# Patient Record
Sex: Male | Born: 1970 | Race: Black or African American | Hispanic: No | Marital: Married | Smoking: Never smoker
Health system: Southern US, Community
[De-identification: ages and names within clinical notes are randomized; demographics above are authoritative.]

## PROBLEM LIST (undated history)

## (undated) DIAGNOSIS — J45909 Unspecified asthma, uncomplicated: Secondary | ICD-10-CM

## (undated) DIAGNOSIS — I509 Heart failure, unspecified: Secondary | ICD-10-CM

## (undated) DIAGNOSIS — I10 Essential (primary) hypertension: Secondary | ICD-10-CM

## (undated) DIAGNOSIS — N289 Disorder of kidney and ureter, unspecified: Secondary | ICD-10-CM

## (undated) DIAGNOSIS — R7989 Other specified abnormal findings of blood chemistry: Secondary | ICD-10-CM

## (undated) DIAGNOSIS — G629 Polyneuropathy, unspecified: Secondary | ICD-10-CM

## (undated) DIAGNOSIS — G4733 Obstructive sleep apnea (adult) (pediatric): Secondary | ICD-10-CM

## (undated) DIAGNOSIS — R945 Abnormal results of liver function studies: Secondary | ICD-10-CM

## (undated) DIAGNOSIS — E669 Obesity, unspecified: Secondary | ICD-10-CM

## (undated) DIAGNOSIS — E119 Type 2 diabetes mellitus without complications: Secondary | ICD-10-CM

## (undated) DIAGNOSIS — K3184 Gastroparesis: Secondary | ICD-10-CM

## (undated) DIAGNOSIS — K219 Gastro-esophageal reflux disease without esophagitis: Secondary | ICD-10-CM

## (undated) DIAGNOSIS — D573 Sickle-cell trait: Secondary | ICD-10-CM

---

## 1997-06-08 HISTORY — PX: HIP FRACTURE SURGERY: SHX118

## 2007-05-29 ENCOUNTER — Emergency Department (HOSPITAL_COMMUNITY): Admission: EM | Admit: 2007-05-29 | Discharge: 2007-05-29 | Payer: Self-pay | Admitting: Emergency Medicine

## 2008-08-23 ENCOUNTER — Emergency Department (HOSPITAL_COMMUNITY): Admission: EM | Admit: 2008-08-23 | Discharge: 2008-08-23 | Payer: Self-pay | Admitting: Emergency Medicine

## 2008-10-25 ENCOUNTER — Encounter: Admission: RE | Admit: 2008-10-25 | Discharge: 2008-10-25 | Payer: Self-pay | Admitting: Family Medicine

## 2009-05-23 ENCOUNTER — Emergency Department (HOSPITAL_COMMUNITY): Admission: EM | Admit: 2009-05-23 | Discharge: 2009-05-23 | Payer: Self-pay | Admitting: Emergency Medicine

## 2009-07-12 ENCOUNTER — Emergency Department (HOSPITAL_COMMUNITY): Admission: EM | Admit: 2009-07-12 | Discharge: 2009-07-12 | Payer: Self-pay | Admitting: Emergency Medicine

## 2010-06-29 ENCOUNTER — Observation Stay (HOSPITAL_COMMUNITY)
Admission: EM | Admit: 2010-06-29 | Discharge: 2010-07-02 | Payer: Self-pay | Source: Home / Self Care | Attending: Cardiology | Admitting: Cardiology

## 2010-07-01 LAB — POCT CARDIAC MARKERS
CKMB, poc: <1 ng/mL — ABNORMAL LOW (ref 1.0–8.0)
CKMB, poc: <1 ng/mL — ABNORMAL LOW (ref 1.0–8.0)
Troponin i, poc: <0.05 ng/mL (ref 0.00–0.09)

## 2010-07-01 LAB — COMPREHENSIVE METABOLIC PANEL
ALT: 19 U/L (ref 0–53)
AST: 22 U/L (ref 0–37)
Albumin: 3.5 g/dL (ref 3.5–5.2)
Alkaline Phosphatase: 92 U/L (ref 39–117)
BUN: 11 mg/dL (ref 6–23)
Calcium: 9.5 mg/dL (ref 8.4–10.5)
Creatinine, Ser: 1.13 mg/dL (ref 0.4–1.5)
Glucose, Bld: 346 mg/dL — ABNORMAL HIGH (ref 70–99)
Potassium: 3.5 mEq/L (ref 3.5–5.1)
Total Protein: 7.2 g/dL (ref 6.0–8.3)

## 2010-07-01 LAB — BASIC METABOLIC PANEL
BUN: 8 mg/dL (ref 6–23)
CO2: 24 mEq/L (ref 19–32)
Calcium: 9.1 mg/dL (ref 8.4–10.5)
Chloride: 102 mEq/L (ref 96–112)
Creatinine, Ser: 1.01 mg/dL (ref 0.4–1.5)
GFR calc Af Amer: >60 mL/min (ref >60)
Glucose, Bld: 465 mg/dL — ABNORMAL HIGH (ref 70–99)
Sodium: 136 mEq/L (ref 135–145)

## 2010-07-01 LAB — CBC
MCH: 27.5 pg (ref 26.0–34.0)
Platelets: 227 10*3/uL (ref 150–400)
RDW: 13 % (ref 11.5–15.5)
WBC: 7.6 10*3/uL (ref 4.0–10.5)

## 2010-07-01 LAB — GLUCOSE, CAPILLARY
Glucose-Capillary: 450 mg/dL — ABNORMAL HIGH (ref 70–99)
Glucose-Capillary: 290 mg/dL — ABNORMAL HIGH (ref 70–99)
Glucose-Capillary: 363 mg/dL — ABNORMAL HIGH (ref 70–99)

## 2010-07-01 LAB — DIFFERENTIAL
Basophils Absolute: 0 10*3/uL (ref 0.0–0.1)
Lymphocytes Relative: 24 % (ref 12–46)
Lymphs Abs: 1.8 10*3/uL (ref 0.7–4.0)
Monocytes Relative: 8 % (ref 3–12)

## 2010-07-01 LAB — PROTIME-INR
INR: 0.9 (ref 0.00–1.49)
Prothrombin Time: 12.4 seconds (ref 11.6–15.2)

## 2010-07-01 LAB — HEMOGLOBIN A1C: Hgb A1c MFr Bld: 12 % — ABNORMAL HIGH (ref <5.7)

## 2010-07-02 LAB — URINALYSIS, ROUTINE W REFLEX MICROSCOPIC
Hgb urine dipstick: NEGATIVE
Leukocytes, UA: NEGATIVE
Nitrite: NEGATIVE
Protein, ur: 30 mg/dL — AB
Urine Glucose, Fasting: 250 mg/dL — AB
Urobilinogen, UA: 0.2 mg/dL (ref 0.0–1.0)

## 2010-07-02 LAB — URINE MICROSCOPIC-ADD ON

## 2010-07-02 LAB — BASIC METABOLIC PANEL
CO2: 27 mEq/L (ref 19–32)
GFR calc Af Amer: >60 mL/min (ref >60)
GFR calc non Af Amer: >60 mL/min (ref >60)
Sodium: 138 mEq/L (ref 135–145)

## 2010-07-02 LAB — GLUCOSE, CAPILLARY
Glucose-Capillary: 263 mg/dL — ABNORMAL HIGH (ref 70–99)
Glucose-Capillary: 300 mg/dL — ABNORMAL HIGH (ref 70–99)
Glucose-Capillary: 263 mg/dL — ABNORMAL HIGH (ref 70–99)
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
## 2010-08-27 LAB — POCT I-STAT, CHEM 8
BUN: 9 mg/dL (ref 6–23)
Calcium, Ion: 1.04 mmol/L — ABNORMAL LOW (ref 1.12–1.32)
Chloride: 106 mEq/L (ref 96–112)
Creatinine, Ser: 0.7 mg/dL (ref 0.4–1.5)
Glucose, Bld: 450 mg/dL — ABNORMAL HIGH (ref 70–99)
HCT: 46 % (ref 39.0–52.0)
Hemoglobin: 15.6 g/dL (ref 13.0–17.0)
Potassium: 4 mEq/L (ref 3.5–5.1)
Sodium: 134 mEq/L — ABNORMAL LOW (ref 135–145)
TCO2: 23 mmol/L (ref 0–100)

## 2010-08-27 LAB — GLUCOSE, CAPILLARY: Glucose-Capillary: 394 mg/dL — ABNORMAL HIGH (ref 70–99)

## 2010-08-27 LAB — CBC
MCHC: 33.5 g/dL (ref 30.0–36.0)
Platelets: 248 10*3/uL (ref 150–400)
RDW: 13.6 % (ref 11.5–15.5)

## 2010-08-27 LAB — DIFFERENTIAL
Basophils Absolute: 0 10*3/uL (ref 0.0–0.1)
Basophils Relative: 0 % (ref 0–1)
Lymphocytes Relative: 19 % (ref 12–46)
Neutro Abs: 6.1 10*3/uL (ref 1.7–7.7)
Neutrophils Relative %: 72 % (ref 43–77)

## 2010-09-08 LAB — GLUCOSE, CAPILLARY: Glucose-Capillary: 391 mg/dL — ABNORMAL HIGH (ref 70–99)

## 2010-09-18 LAB — DIFFERENTIAL
Eosinophils Absolute: 0 10*3/uL (ref 0.0–0.7)
Lymphs Abs: 1.4 10*3/uL (ref 0.7–4.0)
Monocytes Relative: 9 % (ref 3–12)
Neutro Abs: 4.7 10*3/uL (ref 1.7–7.7)
Neutrophils Relative %: 69 % (ref 43–77)

## 2010-09-18 LAB — URINALYSIS, ROUTINE W REFLEX MICROSCOPIC
Glucose, UA: >1000 mg/dL — AB
Leukocytes, UA: NEGATIVE
pH: 5.5 (ref 5.0–8.0)

## 2010-09-18 LAB — CBC
Hemoglobin: 15 g/dL (ref 13.0–17.0)
MCHC: 34.7 g/dL (ref 30.0–36.0)
Platelets: 184 10*3/uL (ref 150–400)
RDW: 12.8 % (ref 11.5–15.5)

## 2010-09-18 LAB — COMPREHENSIVE METABOLIC PANEL
ALT: 20 U/L (ref 0–53)
Calcium: 9.9 mg/dL (ref 8.4–10.5)
Glucose, Bld: 499 mg/dL — ABNORMAL HIGH (ref 70–99)
Sodium: 133 mEq/L — ABNORMAL LOW (ref 135–145)
Total Protein: 7.6 g/dL (ref 6.0–8.3)

## 2010-09-18 LAB — URINE MICROSCOPIC-ADD ON

## 2010-09-18 LAB — GLUCOSE, CAPILLARY
Glucose-Capillary: 499 mg/dL — ABNORMAL HIGH (ref 70–99)
Glucose-Capillary: 560 mg/dL (ref 70–99)

## 2010-12-20 ENCOUNTER — Emergency Department (HOSPITAL_COMMUNITY)
Admission: EM | Admit: 2010-12-20 | Discharge: 2010-12-20 | Disposition: A | Discharge status: Home / Self Care | Payer: Medicaid Other | Attending: Emergency Medicine | Admitting: Emergency Medicine

## 2010-12-20 DIAGNOSIS — R51 Headache: Secondary | ICD-10-CM | POA: Insufficient documentation

## 2010-12-20 DIAGNOSIS — H538 Other visual disturbances: Secondary | ICD-10-CM | POA: Insufficient documentation

## 2010-12-20 DIAGNOSIS — R42 Dizziness and giddiness: Secondary | ICD-10-CM | POA: Insufficient documentation

## 2010-12-20 DIAGNOSIS — I10 Essential (primary) hypertension: Secondary | ICD-10-CM | POA: Insufficient documentation

## 2010-12-20 DIAGNOSIS — E119 Type 2 diabetes mellitus without complications: Secondary | ICD-10-CM | POA: Insufficient documentation

## 2010-12-20 DIAGNOSIS — Z79899 Other long term (current) drug therapy: Secondary | ICD-10-CM | POA: Insufficient documentation

## 2010-12-20 LAB — BASIC METABOLIC PANEL
BUN: 14 mg/dL (ref 6–23)
Calcium: 9.6 mg/dL (ref 8.4–10.5)
GFR calc non Af Amer: >60 mL/min (ref >60)
Glucose, Bld: 208 mg/dL — ABNORMAL HIGH (ref 70–99)

## 2011-01-27 ENCOUNTER — Other Ambulatory Visit: Payer: Self-pay | Admitting: Specialist

## 2011-01-27 DIAGNOSIS — M5416 Radiculopathy, lumbar region: Secondary | ICD-10-CM

## 2011-01-27 DIAGNOSIS — M5412 Radiculopathy, cervical region: Secondary | ICD-10-CM

## 2011-02-01 ENCOUNTER — Other Ambulatory Visit: Payer: Self-pay

## 2011-03-13 LAB — I-STAT 8, (EC8 V) (CONVERTED LAB)
Acid-Base Excess: 1
Bicarbonate: 25.7 — ABNORMAL HIGH
HCT: 43
Hemoglobin: 14.6
Operator id: 294521
Potassium: 3.9
Sodium: 138
TCO2: 27

## 2011-03-13 LAB — DIFFERENTIAL
Basophils Absolute: 0
Eosinophils Relative: 1
Lymphocytes Relative: 14
Lymphs Abs: 1.6
Neutro Abs: 8.3 — ABNORMAL HIGH
Neutrophils Relative %: 76

## 2011-03-13 LAB — CBC
HCT: 39.8
Platelets: 228
RDW: 13.8
WBC: 11 — ABNORMAL HIGH

## 2011-03-13 LAB — POCT I-STAT CREATININE
Creatinine, Ser: 1
Operator id: 294521

## 2011-04-14 ENCOUNTER — Inpatient Hospital Stay: Admission: RE | Admit: 2011-04-14 | Payer: Medicaid Other | Source: Ambulatory Visit

## 2011-04-14 ENCOUNTER — Other Ambulatory Visit: Payer: Medicaid Other

## 2011-05-17 ENCOUNTER — Inpatient Hospital Stay (HOSPITAL_COMMUNITY)
Admission: EM | Admit: 2011-05-17 | Discharge: 2011-05-21 | DRG: 392 | Disposition: A | Discharge status: Home / Self Care | Payer: Medicaid Other | Attending: Internal Medicine | Admitting: Internal Medicine

## 2011-05-17 ENCOUNTER — Encounter: Payer: Self-pay | Admitting: *Deleted

## 2011-05-17 DIAGNOSIS — Z794 Long term (current) use of insulin: Secondary | ICD-10-CM

## 2011-05-17 DIAGNOSIS — E669 Obesity, unspecified: Secondary | ICD-10-CM

## 2011-05-17 DIAGNOSIS — R112 Nausea with vomiting, unspecified: Secondary | ICD-10-CM

## 2011-05-17 DIAGNOSIS — R197 Diarrhea, unspecified: Secondary | ICD-10-CM

## 2011-05-17 DIAGNOSIS — R739 Hyperglycemia, unspecified: Secondary | ICD-10-CM

## 2011-05-17 DIAGNOSIS — J45909 Unspecified asthma, uncomplicated: Secondary | ICD-10-CM | POA: Diagnosis present

## 2011-05-17 DIAGNOSIS — A088 Other specified intestinal infections: Principal | ICD-10-CM | POA: Diagnosis present

## 2011-05-17 DIAGNOSIS — Z79899 Other long term (current) drug therapy: Secondary | ICD-10-CM

## 2011-05-17 DIAGNOSIS — I10 Essential (primary) hypertension: Secondary | ICD-10-CM | POA: Diagnosis present

## 2011-05-17 DIAGNOSIS — R911 Solitary pulmonary nodule: Secondary | ICD-10-CM | POA: Diagnosis present

## 2011-05-17 DIAGNOSIS — E0865 Diabetes mellitus due to underlying condition with hyperglycemia: Secondary | ICD-10-CM

## 2011-05-17 DIAGNOSIS — K3184 Gastroparesis: Secondary | ICD-10-CM | POA: Diagnosis present

## 2011-05-17 DIAGNOSIS — E876 Hypokalemia: Secondary | ICD-10-CM | POA: Diagnosis present

## 2011-05-17 DIAGNOSIS — IMO0001 Reserved for inherently not codable concepts without codable children: Secondary | ICD-10-CM | POA: Diagnosis present

## 2011-05-17 DIAGNOSIS — Z6841 Body Mass Index (BMI) 40.0 and over, adult: Secondary | ICD-10-CM

## 2011-05-17 DIAGNOSIS — K7689 Other specified diseases of liver: Secondary | ICD-10-CM | POA: Diagnosis present

## 2011-05-17 HISTORY — DX: Essential (primary) hypertension: I10

## 2011-05-17 LAB — URINALYSIS, ROUTINE W REFLEX MICROSCOPIC
Bilirubin Urine: NEGATIVE
Glucose, UA: >1000 mg/dL — AB
Hgb urine dipstick: NEGATIVE
Ketones, ur: NEGATIVE mg/dL
Leukocytes, UA: NEGATIVE
Nitrite: NEGATIVE
Protein, ur: 100 mg/dL — AB
Specific Gravity, Urine: 1.018 (ref 1.005–1.030)
Urobilinogen, UA: 1 mg/dL (ref 0.0–1.0)
pH: 7.5 (ref 5.0–8.0)

## 2011-05-17 LAB — BASIC METABOLIC PANEL
Calcium: 10.3 mg/dL (ref 8.4–10.5)
GFR calc Af Amer: >90 mL/min (ref >90)
GFR calc non Af Amer: >90 mL/min (ref >90)
Glucose, Bld: 384 mg/dL — ABNORMAL HIGH (ref 70–99)
Potassium: 3.6 mEq/L (ref 3.5–5.1)
Sodium: 132 mEq/L — ABNORMAL LOW (ref 135–145)

## 2011-05-17 LAB — CBC
Hemoglobin: 14.6 g/dL (ref 13.0–17.0)
MCH: 28.7 pg (ref 26.0–34.0)
MCHC: 34.9 g/dL (ref 30.0–36.0)
RDW: 13 % (ref 11.5–15.5)

## 2011-05-17 LAB — URINE MICROSCOPIC-ADD ON

## 2011-05-17 MED ORDER — INSULIN ASPART 100 UNIT/ML ~~LOC~~ SOLN
10.0000 [IU] | Freq: Once | SUBCUTANEOUS | Status: AC
  Administered 2011-05-17: 10 [IU] via SUBCUTANEOUS
  Filled 2011-05-17: qty 10

## 2011-05-17 MED ORDER — ONDANSETRON HCL 4 MG/2ML IJ SOLN
4.0000 mg | Freq: Once | INTRAMUSCULAR | Status: AC
  Administered 2011-05-17: 4 mg via INTRAVENOUS
  Filled 2011-05-17: qty 2

## 2011-05-17 MED ORDER — INSULIN REGULAR HUMAN 100 UNIT/ML IJ SOLN: 10.0000 [IU] | Freq: Once | INTRAMUSCULAR | Status: DC

## 2011-05-17 MED ORDER — SODIUM CHLORIDE 0.9 % IV BOLUS (SEPSIS)
1000.0000 mL | Freq: Once | INTRAVENOUS | Status: AC
  Administered 2011-05-17: 1000 mL via INTRAVENOUS

## 2011-05-17 MED ORDER — HYDROMORPHONE HCL PF 1 MG/ML IJ SOLN
1.0000 mg | Freq: Once | INTRAMUSCULAR | Status: AC
  Administered 2011-05-17: 1 mg via INTRAVENOUS
  Filled 2011-05-17: qty 1

## 2011-05-17 NOTE — ED Notes (Signed)
CBG 384 - Pt now states he has eaten pound cake today as well as drinking soda

## 2011-05-17 NOTE — ED Notes (Signed)
PT reports ABD pain and N/V since SUN AM

## 2011-05-17 NOTE — ED Notes (Signed)
Pt brought to room 9 via wheelchair - c/o abdominal pain - states ate this am - has had some diarrhea and vomiting today.  States pain is a "10" and his feet hurt from neuropathy.

## 2011-05-17 NOTE — ED Notes (Signed)
Adjusted IV site - advised PT that he needs to protect the site as he almost pulled it out.  Saline still infusing - dilaudid given per orders.

## 2011-05-18 DIAGNOSIS — I10 Essential (primary) hypertension: Secondary | ICD-10-CM | POA: Diagnosis present

## 2011-05-18 LAB — COMPREHENSIVE METABOLIC PANEL
ALT: 16 U/L (ref 0–53)
Albumin: 3.5 g/dL (ref 3.5–5.2)
Alkaline Phosphatase: 100 U/L (ref 39–117)
BUN: 7 mg/dL (ref 6–23)
Calcium: 9.5 mg/dL (ref 8.4–10.5)
Potassium: 3.4 mEq/L — ABNORMAL LOW (ref 3.5–5.1)
Sodium: 138 mEq/L (ref 135–145)
Total Protein: 8.5 g/dL — ABNORMAL HIGH (ref 6.0–8.3)

## 2011-05-18 LAB — DIFFERENTIAL
Basophils Absolute: 0 10*3/uL (ref 0.0–0.1)
Eosinophils Absolute: 0 10*3/uL (ref 0.0–0.7)
Eosinophils Relative: 0 % (ref 0–5)
Lymphocytes Relative: 16 % (ref 12–46)
Monocytes Absolute: 1.1 10*3/uL — ABNORMAL HIGH (ref 0.1–1.0)

## 2011-05-18 LAB — CBC
HCT: 40 % (ref 39.0–52.0)
MCH: 28.1 pg (ref 26.0–34.0)
MCV: 81.5 fL (ref 78.0–100.0)
Platelets: 260 10*3/uL (ref 150–400)
RDW: 13.2 % (ref 11.5–15.5)
WBC: 13.1 10*3/uL — ABNORMAL HIGH (ref 4.0–10.5)

## 2011-05-18 LAB — GLUCOSE, CAPILLARY
Glucose-Capillary: 235 mg/dL — ABNORMAL HIGH (ref 70–99)
Glucose-Capillary: 264 mg/dL — ABNORMAL HIGH (ref 70–99)
Glucose-Capillary: 268 mg/dL — ABNORMAL HIGH (ref 70–99)
Glucose-Capillary: 272 mg/dL — ABNORMAL HIGH (ref 70–99)
Glucose-Capillary: 297 mg/dL — ABNORMAL HIGH (ref 70–99)
Glucose-Capillary: 312 mg/dL — ABNORMAL HIGH (ref 70–99)
Glucose-Capillary: 358 mg/dL — ABNORMAL HIGH (ref 70–99)
Glucose-Capillary: 384 mg/dL — ABNORMAL HIGH (ref 70–99)

## 2011-05-18 LAB — BASIC METABOLIC PANEL
BUN: 7 mg/dL (ref 6–23)
Chloride: 101 mEq/L (ref 96–112)
GFR calc Af Amer: >90 mL/min (ref >90)
GFR calc non Af Amer: >90 mL/min (ref >90)
Potassium: 3.4 mEq/L — ABNORMAL LOW (ref 3.5–5.1)
Sodium: 139 mEq/L (ref 135–145)

## 2011-05-18 LAB — LIPASE, BLOOD: Lipase: 44 U/L (ref 11–59)

## 2011-05-18 LAB — INFLUENZA PANEL BY PCR (TYPE A & B)
H1N1 flu by pcr: NOT DETECTED
Influenza B By PCR: NEGATIVE

## 2011-05-18 MED ORDER — INSULIN ASPART 100 UNIT/ML ~~LOC~~ SOLN
SUBCUTANEOUS | Status: AC
  Filled 2011-05-18: qty 1

## 2011-05-18 MED ORDER — POTASSIUM CHLORIDE CRYS ER 20 MEQ PO TBCR
40.0000 meq | EXTENDED_RELEASE_TABLET | Freq: Once | ORAL | Status: AC
  Administered 2011-05-18: 40 meq via ORAL
  Filled 2011-05-18: qty 2

## 2011-05-18 MED ORDER — ONDANSETRON HCL 4 MG/2ML IJ SOLN
4.0000 mg | INTRAMUSCULAR | Status: DC | PRN
  Administered 2011-05-18 – 2011-05-21 (×9): 4 mg via INTRAVENOUS
  Filled 2011-05-18 (×10): qty 2

## 2011-05-18 MED ORDER — INSULIN REGULAR HUMAN 100 UNIT/ML IJ SOLN
10.0000 [IU] | Freq: Once | INTRAMUSCULAR | Status: DC
  Administered 2011-05-18: 10 [IU] via SUBCUTANEOUS

## 2011-05-18 MED ORDER — TRAZODONE HCL 50 MG PO TABS
50.0000 mg | ORAL_TABLET | Freq: Every day | ORAL | Status: DC
  Administered 2011-05-18 – 2011-05-21 (×3): 50 mg via ORAL
  Filled 2011-05-18 (×4): qty 2

## 2011-05-18 MED ORDER — LOPERAMIDE HCL 2 MG PO CAPS
2.0000 mg | ORAL_CAPSULE | ORAL | Status: DC | PRN
  Filled 2011-05-18: qty 1

## 2011-05-18 MED ORDER — INSULIN ASPART 100 UNIT/ML ~~LOC~~ SOLN: 10.0000 [IU] | Freq: Once | SUBCUTANEOUS | Status: DC

## 2011-05-18 MED ORDER — INSULIN REGULAR HUMAN 100 UNIT/ML IJ SOLN: INTRAMUSCULAR | Status: DC

## 2011-05-18 MED ORDER — DEXTROSE 50 % IV SOLN: 25.0000 mL | INTRAVENOUS | Status: DC | PRN

## 2011-05-18 MED ORDER — PREGABALIN 75 MG PO CAPS
75.0000 mg | ORAL_CAPSULE | Freq: Every evening | ORAL | Status: DC
  Administered 2011-05-18: 75 mg via ORAL
  Filled 2011-05-18 (×2): qty 1

## 2011-05-18 MED ORDER — INSULIN REGULAR BOLUS VIA INFUSION: 0.0000 [IU] | Freq: Three times a day (TID) | INTRAVENOUS | Status: DC

## 2011-05-18 MED ORDER — MORPHINE SULFATE 4 MG/ML IJ SOLN
4.0000 mg | Freq: Once | INTRAMUSCULAR | Status: AC
  Administered 2011-05-18: 4 mg via INTRAVENOUS

## 2011-05-18 MED ORDER — CARVEDILOL 6.25 MG PO TABS
6.2500 mg | ORAL_TABLET | Freq: Two times a day (BID) | ORAL | Status: DC
  Administered 2011-05-18 – 2011-05-19 (×2): 6.25 mg via ORAL
  Filled 2011-05-18 (×8): qty 1

## 2011-05-18 MED ORDER — SODIUM CHLORIDE 0.9 % IV SOLN
INTRAVENOUS | Status: DC
  Administered 2011-05-18 (×2): via INTRAVENOUS
  Administered 2011-05-19: 150 mL/h via INTRAVENOUS
  Administered 2011-05-21: 02:00:00 via INTRAVENOUS

## 2011-05-18 MED ORDER — ONDANSETRON HCL 4 MG/2ML IJ SOLN
4.0000 mg | Freq: Once | INTRAMUSCULAR | Status: AC
  Administered 2011-05-18: 4 mg via INTRAVENOUS
  Filled 2011-05-18: qty 2

## 2011-05-18 MED ORDER — MORPHINE SULFATE 4 MG/ML IJ SOLN
4.0000 mg | INTRAMUSCULAR | Status: DC | PRN
  Administered 2011-05-18 (×3): 4 mg via INTRAVENOUS
  Filled 2011-05-18 (×4): qty 1

## 2011-05-18 MED ORDER — INSULIN GLARGINE 100 UNIT/ML ~~LOC~~ SOLN
80.0000 [IU] | Freq: Every day | SUBCUTANEOUS | Status: DC
  Administered 2011-05-18 – 2011-05-21 (×3): 80 [IU] via SUBCUTANEOUS
  Filled 2011-05-18: qty 3

## 2011-05-18 MED ORDER — MORPHINE SULFATE 2 MG/ML IJ SOLN
2.0000 mg | INTRAMUSCULAR | Status: DC | PRN
  Administered 2011-05-19 – 2011-05-20 (×5): 2 mg via INTRAVENOUS
  Filled 2011-05-18 (×5): qty 1

## 2011-05-18 MED ORDER — DEXTROSE-NACL 5-0.45 % IV SOLN: INTRAVENOUS | Status: DC

## 2011-05-18 MED ORDER — INSULIN ASPART 100 UNIT/ML ~~LOC~~ SOLN
3.0000 [IU] | Freq: Once | SUBCUTANEOUS | Status: AC
  Administered 2011-05-18: 3 [IU] via INTRAVENOUS

## 2011-05-18 MED ORDER — MORPHINE SULFATE 4 MG/ML IJ SOLN: 4.0000 mg | Freq: Once | INTRAMUSCULAR | Status: DC

## 2011-05-18 MED ORDER — INSULIN ASPART 100 UNIT/ML ~~LOC~~ SOLN
0.0000 [IU] | Freq: Three times a day (TID) | SUBCUTANEOUS | Status: DC
  Administered 2011-05-19: 4 [IU] via SUBCUTANEOUS
  Administered 2011-05-21: 3 [IU] via SUBCUTANEOUS
  Filled 2011-05-18: qty 3

## 2011-05-18 MED ORDER — INSULIN ASPART 100 UNIT/ML ~~LOC~~ SOLN
4.0000 [IU] | Freq: Three times a day (TID) | SUBCUTANEOUS | Status: DC
  Administered 2011-05-19: 4 [IU] via SUBCUTANEOUS

## 2011-05-18 MED ORDER — METOCLOPRAMIDE HCL 5 MG/ML IJ SOLN
10.0000 mg | Freq: Once | INTRAMUSCULAR | Status: AC
  Administered 2011-05-18: 10 mg via INTRAVENOUS
  Filled 2011-05-18: qty 2

## 2011-05-18 MED ORDER — OLMESARTAN MEDOXOMIL 40 MG PO TABS
40.0000 mg | ORAL_TABLET | Freq: Every day | ORAL | Status: DC
  Administered 2011-05-18 – 2011-05-20 (×3): 40 mg via ORAL
  Filled 2011-05-18 (×5): qty 1

## 2011-05-18 MED ORDER — SODIUM CHLORIDE 0.9 % IV SOLN
INTRAVENOUS | Status: DC
  Filled 2011-05-18: qty 1

## 2011-05-18 MED ORDER — AMLODIPINE BESYLATE 5 MG PO TABS
5.0000 mg | ORAL_TABLET | Freq: Every day | ORAL | Status: DC
  Administered 2011-05-18 – 2011-05-20 (×3): 5 mg via ORAL
  Filled 2011-05-18 (×5): qty 1

## 2011-05-18 MED ORDER — INSULIN REGULAR HUMAN 100 UNIT/ML IJ SOLN: 3.0000 [IU] | Freq: Once | INTRAMUSCULAR | Status: DC

## 2011-05-18 MED ORDER — POTASSIUM CHLORIDE 20 MEQ PO PACK: 40.0000 meq | PACK | Freq: Once | ORAL | Status: DC

## 2011-05-18 MED ORDER — DICYCLOMINE HCL 10 MG/ML IM SOLN
20.0000 mg | Freq: Once | INTRAMUSCULAR | Status: AC
  Administered 2011-05-18: 20 mg via INTRAMUSCULAR
  Filled 2011-05-18: qty 2

## 2011-05-18 MED ORDER — ONDANSETRON HCL 4 MG PO TABS
4.0000 mg | ORAL_TABLET | ORAL | Status: DC | PRN
  Administered 2011-05-19 (×2): 4 mg via ORAL
  Filled 2011-05-18 (×2): qty 1

## 2011-05-18 MED ORDER — METOCLOPRAMIDE HCL 5 MG/ML IJ SOLN: 10.0000 mg | Freq: Once | INTRAMUSCULAR | Status: DC

## 2011-05-18 MED ORDER — PANTOPRAZOLE SODIUM 40 MG IV SOLR
40.0000 mg | Freq: Once | INTRAVENOUS | Status: AC
  Administered 2011-05-18: 40 mg via INTRAVENOUS
  Filled 2011-05-18: qty 40

## 2011-05-18 MED ORDER — HYDRALAZINE HCL 20 MG/ML IJ SOLN
10.0000 mg | Freq: Four times a day (QID) | INTRAMUSCULAR | Status: DC | PRN
  Administered 2011-05-18: 10 mg via INTRAVENOUS
  Filled 2011-05-18: qty 0.5

## 2011-05-18 MED ORDER — ONDANSETRON HCL 4 MG/2ML IJ SOLN
4.0000 mg | Freq: Four times a day (QID) | INTRAMUSCULAR | Status: DC | PRN
  Administered 2011-05-20: 4 mg via INTRAVENOUS
  Filled 2011-05-18: qty 2

## 2011-05-18 NOTE — Progress Notes (Signed)
Observation review completed. 

## 2011-05-18 NOTE — ED Notes (Signed)
CBG 312 AND IV NS NOT INFUSING WELL, POSITIONAL SO CUT W/O AND FLOWING WELL. PT EASILY AROUSABLE AND ORIENTED.

## 2011-05-18 NOTE — ED Notes (Signed)
Dr Adriana Simas aware of pt glucose 297. Does not wish to give orders at this time to cover the glucose. States will let admitting doctor cover glucose.

## 2011-05-18 NOTE — ED Provider Notes (Signed)
History     CSN: [id number] Arrival date & time: 05/17/2011  9:27 PM   First MD Initiated Contact with Patient 05/17/11 2208      Chief Complaint  Patient presents with  . Emesis    (Consider location/radiation/quality/duration/timing/severity/associated sxs/prior treatment) HPI.... level V caveat for urgent need for intervention. Nausea vomiting and abdominal pain since Sunday. Neck fluids down. He makes it better or worse.  No radiation. Symptoms are moderate  Past Medical History  Diagnosis Date  . Diabetes mellitus   . Hypertension   . Asthma     History reviewed. No pertinent past surgical history.  History reviewed. No pertinent family history.  History  Substance Use Topics  . Smoking status: Never Smoker   . Smokeless tobacco: Not on file  . Alcohol Use: No      Review of Systems  Unable to perform ROS   Allergies  Review of patient's allergies indicates no known allergies.  Home Medications   Current Outpatient Rx  Name Route Sig Dispense Refill  . AMLODIPINE BESYLATE-VALSARTAN 10-320 MG PO TABS Oral Take 1 tablet by mouth daily.      . INSULIN GLARGINE 100 UNIT/ML [place] SOLN Subcutaneous Inject 80 Units into the skin at bedtime.      . OXYCODONE-ACETAMINOPHEN 10-325 MG PO TABS Oral Take 1 tablet by mouth every 8 (eight) hours as needed. For pain       BP 120/50  Pulse 85  Temp(Src) 99.4 F (37.4 C) (Oral)  Resp 16  SpO2 94%  Physical Exam  Nursing note and vitals reviewed. Constitutional: He is oriented to person, place, and time. He appears well-developed and well-nourished.  HENT:  Head: Normocephalic and atraumatic.  Eyes: Conjunctivae and EOM are normal. Pupils are equal, round, and reactive to light.  Neck: Normal range of motion. Neck supple.  Cardiovascular: Normal rate and regular rhythm.   Pulmonary/Chest: Effort normal and breath sounds normal.  Abdominal: Soft. Bowel sounds are normal.       Diffuse abdominal tenderness    Musculoskeletal: Normal range of motion.  Neurological: He is alert and oriented to person, place, and time.  Skin: Skin is warm and dry.  Psychiatric: He has a normal mood and affect.    ED Course  Procedures (including critical care time)  Labs Reviewed  CBC - Abnormal; Notable for the following:    WBC 10.7 (*)    All other components within normal limits  BASIC METABOLIC PANEL - Abnormal; Notable for the following:    Sodium 132 (*)    Glucose, Bld 384 (*)    All other components within normal limits  URINALYSIS, ROUTINE W REFLEX MICROSCOPIC - Abnormal; Notable for the following:    Color, Urine STRAW (*)    APPearance CLOUDY (*)    Glucose, UA >1000 (*)    Protein, ur 100 (*)    All other components within normal limits  URINE MICROSCOPIC-ADD ON - Abnormal; Notable for the following:    Squamous Epithelial / LPF FEW (*)    Bacteria, UA FEW (*)    All other components within normal limits  GLUCOSE, CAPILLARY - Abnormal; Notable for the following:    Glucose-Capillary 384 (*)    All other components within normal limits  GLUCOSE, CAPILLARY - Abnormal; Notable for the following:    Glucose-Capillary 358 (*)    All other components within normal limits  GLUCOSE, CAPILLARY - Abnormal; Notable for the following:    Glucose-Capillary 293 (*)  All other components within normal limits  GLUCOSE, CAPILLARY - Abnormal; Notable for the following:    Glucose-Capillary 312 (*)    All other components within normal limits  GLUCOSE, CAPILLARY - Abnormal; Notable for the following:    Glucose-Capillary 264 (*)    All other components within normal limits  GLUCOSE, CAPILLARY - Abnormal; Notable for the following:    Glucose-Capillary 272 (*)    All other components within normal limits  POCT CBG MONITORING  POCT CBG MONITORING  POCT CBG MONITORING   No results found.   No diagnosis found.    MDM  Patient sent to CDU for further evaluation        Donnetta Hutching,  MD 06/17/11 1528

## 2011-05-18 NOTE — ED Notes (Signed)
Report to audrey on yellow team

## 2011-05-18 NOTE — H&P (Signed)
PCP:  No primary provider on file.   DOA:  05/17/2011  9:27 PM  Chief Complaint:  Nausea and  vomiting with diarrhea x 1 days  HPI: Kenneth Fox is a 39 y/o obese male with hx of diabetes mellitus on insulin, HTN, and asthma presented with 1 day hx of nausea vomiting with diarrhea. As per patient he had  Gone to [place] 1 wk back and after returning felt feverish with chills, runny nose and body aches 3 days back. His daughter had gastroenteritis few days back. Since yesterday he has been having persistent nausea , vomiting of food along with watery diarrhea. He complains of abdominal discomfort with belching. Patient denies any urinary symptoms, chest pain, palpitations or SOB.denies joit pains. Denies recent abx use. He was given aggressive IV fluids in ED overnight without much improvement.   Allergies: No Known Allergies  Prior to Admission medications   Medication Sig Start Date End Date Taking? Authorizing Provider  amLODipine (NORVASC) 5 MG tablet Take 5 mg by mouth daily.     Yes Historical Provider, MD  Amlodipine-Valsartan-HCTZ (EXFORGE HCT) 10-320-25 MG TABS Take 1 tablet by mouth daily.     Yes Historical Provider, MD  carvedilol (COREG) 6.25 MG tablet Take 6.25 mg by mouth 2 (two) times daily with a meal.     Yes Historical Provider, MD  insulin glargine (LANTUS) 100 UNIT/ML injection Inject 80 Units into the skin at bedtime.     Yes Historical Provider, MD  oxyCODONE-acetaminophen (PERCOCET) 10-325 MG per tablet Take 1 tablet by mouth every 8 (eight) hours as needed. For pain    Yes Historical Provider, MD  pregabalin (LYRICA) 75 MG capsule Take 75 mg by mouth every evening.     Yes Historical Provider, MD  traZODone (DESYREL) 100 MG tablet Take 50-100 mg by mouth at bedtime.     Yes Historical Provider, MD    Past Medical History  Diagnosis Date  . Diabetes mellitus   . Hypertension   . Asthma     History reviewed. No pertinent past surgical history.  Social History:  denies smoking.  He reports that he does not drink alcohol or use illicit drugs.  History reviewed. No pertinent family history.  Review of Systems:  Constitutional: subjective  chills, diaphoresis, appetite change and fatigue.  HEENT: Denies photophobia, eye pain, redness, hearing loss, ear pain, congestion, sore throat, rhinorrhea, sneezing, mouth sores, trouble swallowing, neck pain, neck stiffness and tinnitus.   Respiratory: Denies SOB, DOE, cough, chest tightness,  and wheezing.   Cardiovascular: Denies chest pain, palpitations and leg swelling.  Gastrointestinal:  nausea, vomiting, abdominal pain, diarrhea, denies constipation, blood in stool and abdominal distention.  Genitourinary: Denies dysuria, urgency, frequency, hematuria, flank pain and difficulty urinating.  Musculoskeletal: myalgias, denies back pain, joint swelling, arthralgias and gait problem.  Skin: Denies pallor, rash and wound.  Neurological: Denies dizziness, seizures, syncope, weakness, light-headedness, numbness and headaches.  Hematological: Denies adenopathy. Easy bruising, personal or family bleeding history  Psychiatric/Behavioral: Denies suicidal ideation, mood changes, confusion, nervousness, sleep disturbance and agitation   Physical Exam:  Filed Vitals:   05/18/11 1021 05/18/11 1244 05/18/11 1406 05/18/11 1440  BP: 162/107 138/88 152/80   Pulse: 97 98 94   Temp: 97.8 F (36.6 C)     TempSrc: Oral     Resp: 20 22 16    Height:    5\' 10"  (1.778 m)  Weight:    145.151 kg (320 lb)  SpO2: 100% 98% 98%  Constitutional: Vital signs reviewed.   Obese male lying in bed in some discomfort. HEENT: no pallor, no icterus, dry oral mucosa, no LAD Cardiovascular: RRR, S1 normal, S2 normal, no MRG, pulses symmetric and intact bilaterally Pulmonary/Chest: CTAB, no wheezes, rales, or rhonchi Abdominal: Soft.  non-distended, tenderness to palpation over mid abdomen, bowel sounds are normal, no masses,  organomegaly, or guarding present.  GU: no CVA tenderness Musculoskeletal: No joint deformities, erythema, or stiffness, ROM full and no nontender Ext: no edema and no cyanosis, pulses palpable bilaterally (DP and PT) Hematology: no cervical, inginal, or axillary adenopathy.  Neurological: A&O x3, Strenght is normal and symmetric bilaterally, cranial nerve II-XII are grossly intact, no focal motor deficit, sensory intact to light touch bilaterally.  Skin: Warm, dry and intact. No rash, cyanosis, or clubbing.  Psychiatric: Normal mood and affect. speech and behavior is normal. Judgment and thought content normal. Cognition and memory are normal.   Labs on Admission:  Results for orders placed during the hospital encounter of 05/17/11 (from the past 48 hour(s))  GLUCOSE, CAPILLARY     Status: Abnormal   Collection Time   05/17/11 10:16 PM      Component Value Range Comment   Glucose-Capillary 384 (*) 70 - 99 (mg/dL)   CBC     Status: Abnormal   Collection Time   05/17/11 10:23 PM      Component Value Range Comment   WBC 10.7 (*) 4.0 - 10.5 (K/uL)    RBC 5.09  4.22 - 5.81 (MIL/uL)    Hemoglobin 14.6  13.0 - 17.0 (g/dL)    HCT 16.1  09.6 - 04.5 (%)    MCV 82.1  78.0 - 100.0 (fL)    MCH 28.7  26.0 - 34.0 (pg)    MCHC 34.9  30.0 - 36.0 (g/dL)    RDW 40.9  81.1 - 91.4 (%)    Platelets 235  150 - 400 (K/uL)   BASIC METABOLIC PANEL     Status: Abnormal   Collection Time   05/17/11 10:23 PM      Component Value Range Comment   Sodium 132 (*) 135 - 145 (mEq/L)    Potassium 3.6  3.5 - 5.1 (mEq/L)    Chloride 97  96 - 112 (mEq/L)    CO2 25  19 - 32 (mEq/L)    Glucose, Bld 384 (*) 70 - 99 (mg/dL)    BUN 8  6 - 23 (mg/dL)    Creatinine, Ser 7.82  0.50 - 1.35 (mg/dL)    Calcium 95.6  8.4 - 10.5 (mg/dL)    GFR calc non Af Amer >90  >90 (mL/min)    GFR calc Af Amer >90  >90 (mL/min)   URINALYSIS, ROUTINE W REFLEX MICROSCOPIC     Status: Abnormal   Collection Time   05/17/11 10:34 PM       Component Value Range Comment   Color, Urine STRAW (*) YELLOW     APPearance CLOUDY (*) CLEAR     Specific Gravity, Urine 1.018  1.005 - 1.030     pH 7.5  5.0 - 8.0     Glucose, UA >1000 (*) NEGATIVE (mg/dL)    Hgb urine dipstick NEGATIVE  NEGATIVE     Bilirubin Urine NEGATIVE  NEGATIVE     Ketones, ur NEGATIVE  NEGATIVE (mg/dL)    Protein, ur 213 (*) NEGATIVE (mg/dL)    Urobilinogen, UA 1.0  0.0 - 1.0 (mg/dL)    Nitrite NEGATIVE  NEGATIVE  Leukocytes, UA NEGATIVE  NEGATIVE    URINE MICROSCOPIC-ADD ON     Status: Abnormal   Collection Time   05/17/11 10:34 PM      Component Value Range Comment   Squamous Epithelial / LPF FEW (*) RARE     WBC, UA 0-2  <3 (WBC/hpf)    RBC / HPF 0-2  <3 (RBC/hpf)    Bacteria, UA FEW (*) RARE    GLUCOSE, CAPILLARY     Status: Abnormal   Collection Time   05/18/11 12:13 AM      Component Value Range Comment   Glucose-Capillary 358 (*) 70 - 99 (mg/dL)   GLUCOSE, CAPILLARY     Status: Abnormal   Collection Time   05/18/11  2:17 AM      Component Value Range Comment   Glucose-Capillary 293 (*) 70 - 99 (mg/dL)   GLUCOSE, CAPILLARY     Status: Abnormal   Collection Time   05/18/11  4:55 AM      Component Value Range Comment   Glucose-Capillary 312 (*) 70 - 99 (mg/dL)   GLUCOSE, CAPILLARY     Status: Abnormal   Collection Time   05/18/11  6:07 AM      Component Value Range Comment   Glucose-Capillary 264 (*) 70 - 99 (mg/dL)   GLUCOSE, CAPILLARY     Status: Abnormal   Collection Time   05/18/11  8:23 AM      Component Value Range Comment   Glucose-Capillary 272 (*) 70 - 99 (mg/dL)    Comment 1 Call MD NNP PA CNM     GLUCOSE, CAPILLARY     Status: Abnormal   Collection Time   05/18/11  9:40 AM      Component Value Range Comment   Glucose-Capillary 268 (*) 70 - 99 (mg/dL)    Comment 1 Call MD NNP PA CNM     GLUCOSE, CAPILLARY     Status: Abnormal   Collection Time   05/18/11  2:01 PM      Component Value Range Comment   Glucose-Capillary  297 (*) 70 - 99 (mg/dL)    Comment 1 Call MD NNP PA CNM       Radiological Exams on Admission: No results found.  Assessment:  40 y/o obese male with hx of DM, HTN, asthma presented with 3 day hx of flu like symptoms and 1 day hx of acute gastroenteritis with uncontrolled blood glucose.   PLAN:  Acute gastroenteritis Likely viral  Hx of sick contact Cont aggressive IV hydration for now with NS Cont prn zofran Stool studies abdominal pain likely related to persistent retching . Chem wnl. LFTs wnl. Lipase negative  will hold off on abdominal imaging for now Given recent flu like symptoms will check for PCR. Droplet isolation for now Morphine prn for pain Start on clears  Uncontrolled DM with hyperglycemia  patient informs he was missing his insulin doe for past  2 days  Will put him back on his home regimen Cont SSI   Hypertension  noted to be hypertensive in ED  resume home meds Prn hydralazine   DVT prophylaxis: early ambulation  Diet: clears   Time Spent on Admission: 45 minutes  Kenneth Fox 05/18/2011, 3:36 PM

## 2011-05-18 NOTE — ED Provider Notes (Signed)
Medical screening examination/treatment/procedure(s) were conducted as a shared visit with non-physician practitioner(s) and myself.  I personally evaluated the patient during the encounter.  Patient still nauseated with vomiting after several bags of IV fluids and anti-emetics. Will admit to hospital.  Donnetta Hutching, MD 05/18/11 [phone]

## 2011-05-18 NOTE — ED Provider Notes (Signed)
History     CSN: [id number] Arrival date & time: 05/17/2011  9:27 PM   First MD Initiated Contact with Patient 05/17/11 2208      Chief Complaint  Patient presents with  . Emesis    (Consider location/radiation/quality/duration/timing/severity/associated sxs/prior treatment) Patient is a 40 y.o. male presenting with vomiting. The history is provided by the patient.  Emesis  This is a new problem. The current episode started 12 to 24 hours ago. The problem occurs 5 to 10 times per day. The problem has not changed since onset.The emesis has an appearance of stomach contents and bilious material. There has been no fever. Associated symptoms include abdominal pain and diarrhea. Pertinent negatives include no chills, no cough, no fever, no headaches, no myalgias, no sweats and no URI. Risk factors include ill contacts.   patient with sudden onset of nausea, vomiting, diarrhea this morning. His daughter has a similar illness. He also notices blood sugar has been high as he left his diabetic medications in a rental car a couple of days ago. He reports that the only thing he has been able to eat today was a pound cake and a soda.  Past Medical History  Diagnosis Date  . Diabetes mellitus   . Hypertension   . Asthma     History reviewed. No pertinent past surgical history.  History reviewed. No pertinent family history.  History  Substance Use Topics  . Smoking status: Never Smoker   . Smokeless tobacco: Not on file  . Alcohol Use: No      Review of Systems  Constitutional: Negative for fever and chills.  HENT: Negative for ear pain, congestion, sore throat, neck pain, neck stiffness and tinnitus.   Eyes: Negative for pain and visual disturbance.  Respiratory: Negative for cough, chest tightness and shortness of breath.   Cardiovascular: Negative for chest pain and palpitations.  Gastrointestinal: Positive for vomiting, abdominal pain and diarrhea. Negative for blood in stool.    Genitourinary: Negative for dysuria and hematuria.  Musculoskeletal: Negative for myalgias, back pain and gait problem.  Skin: Negative for rash and wound.  Neurological: Negative for dizziness, syncope, weakness and headaches.  Psychiatric/Behavioral: Negative for behavioral problems and confusion.    Allergies  Review of patient's allergies indicates no known allergies.  Home Medications   Current Outpatient Rx  Name Route Sig Dispense Refill  . AMLODIPINE BESYLATE-VALSARTAN 10-320 MG PO TABS Oral Take 1 tablet by mouth daily.      . INSULIN GLARGINE 100 UNIT/ML [place] SOLN Subcutaneous Inject 80 Units into the skin at bedtime.      . OXYCODONE-ACETAMINOPHEN 10-325 MG PO TABS Oral Take 1 tablet by mouth every 8 (eight) hours as needed. For pain       BP 152/88  Pulse 79  Temp(Src) 96.5 F (35.8 C) (Oral)  Resp 16  SpO2 97%  Physical Exam  Nursing note and vitals reviewed. Constitutional: He is oriented to person, place, and time. He appears well-developed and well-nourished.       Uncomfortable appearing  HENT:  Head: Normocephalic and atraumatic.  Right Ear: External ear normal.  Left Ear: External ear normal.  Mouth/Throat: No oropharyngeal exudate.       Oropharynx clear. Mucous membranes dry.  Eyes: EOM are normal. Pupils are equal, round, and reactive to light.  Neck: Normal range of motion. Neck supple.  Cardiovascular: Normal rate, regular rhythm, normal heart sounds and intact distal pulses.   Pulmonary/Chest: Effort normal and breath sounds normal.  No respiratory distress. He has no wheezes. He exhibits no tenderness.  Abdominal: Soft. Bowel sounds are normal. He exhibits no mass. There is no tenderness. There is no rebound and no guarding.       Obese  Musculoskeletal: Normal range of motion. He exhibits no edema and no tenderness.  Lymphadenopathy:    He has no cervical adenopathy.  Neurological: He is alert and oriented to person, place, and time. No cranial  nerve deficit.  Skin: Skin is warm and dry. No rash noted.  Psychiatric: He has a normal mood and affect. His behavior is normal.    ED Course  Procedures (including critical care time)  Labs Reviewed  CBC - Abnormal; Notable for the following:    WBC 10.7 (*)    All other components within normal limits  BASIC METABOLIC PANEL - Abnormal; Notable for the following:    Sodium 132 (*)    Glucose, Bld 384 (*)    All other components within normal limits  URINALYSIS, ROUTINE W REFLEX MICROSCOPIC - Abnormal; Notable for the following:    Color, Urine STRAW (*)    APPearance CLOUDY (*)    Glucose, UA >1000 (*)    Protein, ur 100 (*)    All other components within normal limits  URINE MICROSCOPIC-ADD ON - Abnormal; Notable for the following:    Squamous Epithelial / LPF FEW (*)    Bacteria, UA FEW (*)    All other components within normal limits  GLUCOSE, CAPILLARY - Abnormal; Notable for the following:    Glucose-Capillary 384 (*)    All other components within normal limits  GLUCOSE, CAPILLARY - Abnormal; Notable for the following:    Glucose-Capillary 358 (*)    All other components within normal limits  POCT CBG MONITORING   No results found.   Diagnosis #1 gastroenteritis Diagnosis #2 hyperglycemia   MDM  Patient with likely viral gastroenteritis as he had sudden onset of vomiting and diarrhea with abdominal cramping and his daughter has a similar illness. This is being treated with IV fluids and antiemetic.  He also has diabetes with hyperglycemia likely secondary to dehydration, lack of daily medication, and intake of high carbohydrate food this morning. I will place him on the hyperglycemia protocol in the CDU where he'll be followed overnight. I have discussed his care with Dr Brooke Dare who will follow. I have discussed with the attending physician who will follow the patient overnight that he will likely need prescriptions for his regular outpatient  prescriptions.        [address] [place], [place] 05/18/11 [phone]

## 2011-05-18 NOTE — ED Provider Notes (Signed)
7:22 AM Patient signed out to me by Dr. Brooke Dare.  Assume care of patient in the CDU.  Patient is currently on hyperglycemia protocol and has been diagnosed with viral gastroenteritis.  Plan is to discharge patient once blood sugar comes down and to ensure that he is discharged with insulin.  7:30 AM Patient reassessed.  Patient reports that he is feeling nauseous and is having some generalized abdominal crampy pain.  No other complaints at this time.  NAD, A & O x 3, Heart RRR, Lungs CTAB, Abdomen soft with mild diffuse tenderness.  10:00 AM Patient reports that his abdominal pain has improved.  Patient not in any acute distress at this time.  12:15 PM Patient reports that he is not feeling any better than he was when he first came to the hospital. Patient continues to feel nauseous even after Zofran administration.  Patient discussed with Dr. Adriana Simas who also saw the patient in the CDU.  Dr. Adriana Simas recommends that the patient be admitted because his symptoms have not improved since he came to the ED initially.  2:53 PM Reassessed patient.  He reports that his nausea has improved and his abdominal pain.  He feels that the morphine and Reglan have helped. 3:00 PM Discussed patient with [facility].  He requested ordering a lipase and a repeat CMP and will admit the patient.  Labs have been ordered and bed request has been put in.   Pascal Lux [facility] 05/18/11 1518

## 2011-05-19 LAB — HEMOGLOBIN A1C: Mean Plasma Glucose: 243 mg/dL — ABNORMAL HIGH (ref <117)

## 2011-05-19 LAB — GLUCOSE, CAPILLARY: Glucose-Capillary: 141 mg/dL — ABNORMAL HIGH (ref 70–99)

## 2011-05-19 LAB — CBC
HCT: 37.4 % — ABNORMAL LOW (ref 39.0–52.0)
MCHC: 34 g/dL (ref 30.0–36.0)
RDW: 13.5 % (ref 11.5–15.5)
WBC: 12.3 10*3/uL — ABNORMAL HIGH (ref 4.0–10.5)

## 2011-05-19 MED ORDER — METOCLOPRAMIDE HCL 5 MG PO TABS
5.0000 mg | ORAL_TABLET | Freq: Four times a day (QID) | ORAL | Status: DC | PRN
  Administered 2011-05-19 – 2011-05-21 (×3): 5 mg via ORAL
  Filled 2011-05-19 (×4): qty 1

## 2011-05-19 MED ORDER — ONDANSETRON HCL 4 MG PO TABS: 4.0000 mg | ORAL_TABLET | Freq: Three times a day (TID) | ORAL | Status: DC | PRN

## 2011-05-19 MED ORDER — POTASSIUM CHLORIDE CRYS ER 20 MEQ PO TBCR
40.0000 meq | EXTENDED_RELEASE_TABLET | Freq: Once | ORAL | Status: AC
  Administered 2011-05-19: 40 meq via ORAL
  Filled 2011-05-19: qty 2

## 2011-05-19 MED ORDER — PREGABALIN 75 MG PO CAPS: 75.0000 mg | ORAL_CAPSULE | Freq: Every evening | ORAL | Status: DC

## 2011-05-19 NOTE — Discharge Summary (Signed)
Patient ID: Kenneth Fox MRN: [id number] DOB/AGE: 42/18/1972 42 y.o.  Admit date: 05/17/2011 Discharge date: 05/19/2011  Primary Care Physician:  Geraldo Pitter, MD  Discharge Diagnoses:    Present on Admission:  .acute Gastroenteritis, viral .Diabetes mellitus due to underlying condition with hyperglycemia .Obesity .Hypertension    Current Discharge Medication List    START taking these medications   Details  ondansetron (ZOFRAN) 4 MG tablet Take 1 tablet (4 mg total) by mouth every 8 (eight) hours as needed for nausea. Qty: 15 tablet, Refills: 0      CONTINUE these medications which have CHANGED   Details  pregabalin (LYRICA) 75 MG capsule Take 1 capsule (75 mg total) by mouth every evening. Qty: 10 capsule, Refills: 0      CONTINUE these medications which have NOT CHANGED   Details  amLODipine (NORVASC) 5 MG tablet Take 5 mg by mouth daily.      Amlodipine-Valsartan-HCTZ (EXFORGE HCT) 10-320-25 MG TABS Take 1 tablet by mouth daily.      carvedilol (COREG) 6.25 MG tablet Take 6.25 mg by mouth 2 (two) times daily with a meal.      insulin glargine (LANTUS) 100 UNIT/ML injection Inject 80 Units into the skin at bedtime.      oxyCODONE-acetaminophen (PERCOCET) 10-325 MG per tablet Take 1 tablet by mouth every 8 (eight) hours as needed. For pain     traZODone (DESYREL) 100 MG tablet Take 50-100 mg by mouth at bedtime.          Disposition and Follow-up: Follow up with PCP in 1 week  Consults:  none  Significant Diagnostic Studies:  No results found.  Brief H and P: For complete details please refer to admission H and P, but in brief Kenneth Fox is a 40 y/o obese male with hx of diabetes mellitus on insulin, HTN, and asthma presented with 1 day hx of nausea vomiting with diarrhea. As per patient he had Gone to [place] 1 wk back and after returning felt feverish with chills, runny nose and body aches 3 days back. His daughter had gastroenteritis few days  back. Since yesterday he has been having persistent nausea , vomiting of food along with watery diarrhea. He complains of abdominal discomfort with belching. Patient denies any urinary symptoms, chest pain, palpitations or SOB.denies joit pains. Denies recent abx use. He was given aggressive IV fluids in ED overnight without much improvement.    Physical Exam on Discharge:  Filed Vitals:   05/19/11 0500 05/19/11 0833 05/19/11 1035 05/19/11 1045  BP: 120/78 156/103 149/90 164/94  Pulse: 90 101 81 92  Temp: 98.5 F (36.9 C)   97.4 F (36.3 C)  TempSrc: Oral   Oral  Resp: 18   20  Height:      Weight:      SpO2: 90%   95%     Intake/Output Summary (Last 24 hours) at 05/19/11 1253 Last data filed at 05/19/11 0859  Gross per 24 hour  Intake    240 ml  Output      0 ml  Net    240 ml    Obese male lying in bed in some discomfort.  HEENT: no pallor, no icterus, dry oral mucosa, no LAD  Cardiovascular: RRR, S1 normal, S2 normal, no MRG, pulses symmetric and intact bilaterally  Pulmonary/Chest: CTAB, no wheezes, rales, or rhonchi  Abdominal: Soft. non-distended, tenderness to palpation over mid abdomen, bowel sounds are normal, no masses, organomegaly, or guarding present.  GU: no CVA tenderness Musculoskeletal: No joint deformities, erythema, or stiffness, ROM full and no nontender Ext: no edema and no cyanosis, pulses palpable bilaterally (DP and PT)  Hematology: no cervical, inginal, or axillary adenopathy.  Neurological: A&O x3, Strenght is normal and symmetric bilaterally, cranial nerve II-XII are grossly intact, no focal motor deficit, sensory intact to light touch bilaterally.  Skin: Warm, dry and intact. No rash, cyanosis, or clubbing.     CBC:    Component Value Date/Time   WBC 12.3* 05/19/2011 0601   HGB 12.7* 05/19/2011 0601   HCT 37.4* 05/19/2011 0601   PLT 238 05/19/2011 0601   MCV 82.9 05/19/2011 0601   NEUTROABS 10.0* 05/18/2011 1912   LYMPHSABS 2.1 05/18/2011  1912   MONOABS 1.1* 05/18/2011 1912   EOSABS 0.0 05/18/2011 1912   BASOSABS 0.0 05/18/2011 1912    Basic Metabolic Panel:    Component Value Date/Time   NA 139 05/18/2011 1759   K 3.4* 05/18/2011 1759   CL 101 05/18/2011 1759   CO2 28 05/18/2011 1759   BUN 7 05/18/2011 1759   CREATININE 0.81 05/18/2011 1759   GLUCOSE 243* 05/18/2011 1759   CALCIUM 9.5 05/18/2011 1759    Hospital Course:   Acute viral astroenteritis  Patient likely had a acute viral gastroenteritis with a hx of sick contact at home Patient admitted and given aggressive  IV hydration  with NS and now improved  prn zofran given and his nausea, vomiting and diarrhea has resolved since asmission abdominal pain likely related to persistent retching . Resolved at present. Chem wnl. LFTs wnl. Lipase negative. Given recent flu like symptoms checked for PCR and was negative.    Uncontrolled DM with hyperglycemia  patient informs he was missing his insulin dose for past 2 days  placed him back on his home regimen and blood glucose now stable Patient will follow up with his PCP as outpt  Hypertension  noted to be hypertensive in ED  resume home meds  Will follow with PCP  Patient clinically stable and can be discharged home with outpatient follow up.  Time spent on Discharge: 45 minutes  Signed: Eddie North 05/19/2011, 12:53 PM

## 2011-05-20 ENCOUNTER — Inpatient Hospital Stay (HOSPITAL_COMMUNITY): Payer: Medicaid Other

## 2011-05-20 LAB — GLUCOSE, CAPILLARY
Glucose-Capillary: 153 mg/dL — ABNORMAL HIGH (ref 70–99)
Glucose-Capillary: 208 mg/dL — ABNORMAL HIGH (ref 70–99)
Glucose-Capillary: 182 mg/dL — ABNORMAL HIGH (ref 70–99)
Glucose-Capillary: 165 mg/dL — ABNORMAL HIGH (ref 70–99)
Glucose-Capillary: 169 mg/dL — ABNORMAL HIGH (ref 70–99)
Glucose-Capillary: 174 mg/dL — ABNORMAL HIGH (ref 70–99)

## 2011-05-20 LAB — COMPREHENSIVE METABOLIC PANEL
ALT: 14 U/L (ref 0–53)
AST: 15 U/L (ref 0–37)
Albumin: 3.4 g/dL — ABNORMAL LOW (ref 3.5–5.2)
CO2: 28 mEq/L (ref 19–32)
Calcium: 9.5 mg/dL (ref 8.4–10.5)
Chloride: 100 mEq/L (ref 96–112)
Creatinine, Ser: 0.88 mg/dL (ref 0.50–1.35)
Sodium: 136 mEq/L (ref 135–145)

## 2011-05-20 MED ORDER — IOHEXOL 300 MG/ML  SOLN
80.0000 mL | Freq: Once | INTRAMUSCULAR | Status: AC | PRN
  Administered 2011-05-20: 80 mL via INTRAVENOUS

## 2011-05-20 MED ORDER — PANTOPRAZOLE SODIUM 40 MG IV SOLR
40.0000 mg | Freq: Every day | INTRAVENOUS | Status: DC
  Administered 2011-05-20 – 2011-05-21 (×2): 40 mg via INTRAVENOUS
  Filled 2011-05-20 (×2): qty 40

## 2011-05-20 NOTE — Progress Notes (Signed)
Dr. Adela Glimpse called around 2330, 05/19/11 regarding pt's continuing nausea. Pt given both prn zofran and reglan with no relief. Pt has no vomited. Dr. Adela Glimpse ordered x-ray of abd. Will continue to monitor pt's nausea and abd discomfort.  Salvadore Oxford, RN 05/20/11 [phone]

## 2011-05-20 NOTE — Progress Notes (Signed)
Subjective: Chart reviewed. Patient is a 40 year old African American male patient with history of long-standing diabetes mellitus, hypertension who has been unwell since November 30. His 2 small children had upper respiratory tract infection symptoms and? GI symptoms recently. He presented with history of nausea, vomiting and diarrhea with associated abdominal discomfort.  He indicates that he feels queasy and nauseous especially after eating something and this is associated with upper abdominal pain. He has had no further diarrhea since hospitalization (no BM since admission). He is passing flatus. He was almost discharged yesterday but the discharge was canceled secondary to his persistent nausea and abdominal pain. He's never had EGD or colonoscopy. He is awaiting CT of the abdomen and pelvis.  Objective: Blood pressure 161/98, pulse 84, temperature 97.8 F (36.6 C), temperature source Oral, resp. rate 20, height 5\' 10"  (1.778 m), weight 145.151 kg (320 lb), SpO2 97.00%.  Intake/Output Summary (Last 24 hours) at 26-May-2011 1205 Last data filed at 05/19/11 1700  Gross per 24 hour  Intake    240 ml  Output      0 ml  Net    240 ml   General exam: Obese male patient lying supine in bed in no obvious distress. Respiratory system: Clear. No increased work of breathing. Cardiovascular system: S1 and S2 heard, regular. No JVD. Gastrointestinal system: Abdomen is mildly obese, epigastric tenderness but normal bowel sounds heard. Abdomen however is soft. There is no rigidity guarding or rebound. Central nervous system: Alert and oriented. No focal neurological deficits.  Lab Results: Basic Metabolic Panel:  Basename 05/18/11 1759 05/18/11 1526  NA 139 138  K 3.4* 3.4*  CL 101 100  CO2 28 28  GLUCOSE 243* 260*  BUN 7 7  CREATININE 0.81 0.85  CALCIUM 9.5 9.5  MG -- --  PHOS -- --   Liver Function Tests:  Basename 05/18/11 1526  AST 12  ALT 16  ALKPHOS 100  BILITOT 0.3  PROT 8.5*    ALBUMIN 3.5    Basename 05/18/11 1526  LIPASE 44  AMYLASE --   No results found for this basename: AMMONIA:2 in the last 72 hours CBC:  Basename 05/19/11 0601 05/18/11 1912  WBC 12.3* 13.1*  NEUTROABS -- 10.0*  HGB 12.7* 13.8  HCT 37.4* 40.0  MCV 82.9 81.5  PLT 238 260   CBG:  Basename 05-26-11 1116 May 26, 2011 0636 05/19/11 2118 05/19/11 1144 05/19/11 0645 05/18/11 2131  GLUCAP 208* 153* 208* 141* 182* 235*   Hemoglobin A1C:  Basename 05/19/11 0840  HGBA1C 10.1*   Studies/Results: Dg Abd 2 Views  May 26, 2011  **ADDENDUM** CREATED: 05/26/2011 00:38:28  Findings were discussed by telephone with Dr. Adela Glimpse.  She reports the patient is having nausea and vomiting and is unable to tolerate meals.  Although no gaseous small bowel dilatation is evident, fluid-filled small bowel would be inapparent on x-ray.  CT imaging may prove helpful to further evaluate.  **END ADDENDUM** SIGNED BY: Minerva Areola A. Molli Posey, M.D.   May 26, 2011  *RADIOLOGY REPORT*  Clinical Data: Pain.  ABDOMEN - 2 VIEW  Comparison: No comparison studies available.  Findings: Upright film shows no evidence for intraperitoneal free air.  Supine film shows no gaseous small bowel dilatation to suggest small bowel obstruction.  Air is visualized in a nondilated transverse colon.  The patient is status post ORIF for right acetabular fracture.  IMPRESSION: No evidence for intraperitoneal free air or bowel obstruction.  Original Report Authenticated By: ERIC A. MANSELL, M.D.    Medications: Scheduled  Meds:   . amLODipine  5 mg Oral Daily  . carvedilol  6.25 mg Oral BID WC  . insulin aspart  0-20 Units Subcutaneous TID WC  . insulin aspart  4 Units Subcutaneous TID WC  . insulin glargine  80 Units Subcutaneous QHS  . olmesartan  40 mg Oral Daily  . pregabalin  75 mg Oral QPM  . traZODone  50-100 mg Oral QHS   Continuous Infusions:   . sodium chloride 150 mL/hr (05/19/11 0856)   PRN Meds:.dextrose, hydrALAZINE, loperamide,  metoCLOPramide, morphine, morphine injection, ondansetron (ZOFRAN) IV, ondansetron (ZOFRAN) IV, ondansetron  Assessment/Plan: 1. Nausea, vomiting, diarrhea and abdominal pain: Differential diagnosis includes acute viral gastroenteritis versus exacerbation of diabetic gastroparesis. Abdominal x-ray does not show acute findings. Vomiting and diarrhea have resolved. Will await CT abdomen results. The other differential diagnosis may be gastritis versus peptic ulcer disease. Will add Protonix and check CMP and lipase. 2. Poorly controlled type 2 diabetes mellitus as evidenced by markedly elevated hemoglobin A1c. Try to control diabetes parents an outpatient. 3. Hypokalemia: Repeat BMP and repeat appropriately. 4. Hypertension: Mildly uncontrolled. Continue home medications.  Discussed patient's care at length with his spouse was at the bedside.  Disposition: Not yet ready for discharge.    Zerina Hallinan 05/20/2011, 12:05 PM

## 2011-05-20 NOTE — ED Provider Notes (Signed)
Medical screening examination/treatment/procedure(s) were performed by non-physician practitioner and as supervising physician I was immediately available for consultation/collaboration.   Gwyneth Sprout, MD 05/20/11 [phone]

## 2011-05-20 NOTE — Progress Notes (Signed)
I went in to the patient's room to discuss his discharge paperwork. The patient was severely nauseated and had sharp pain in his abdomen. I gave him PO Zofran since his IV had been D/C'd and I called Dr. Adela Glimpse and she decided she wanted to keep him for another night since his symptoms had not been resolved. Patient and family made aware that he would not be going home, and Shanda Bumps, [place] the oncoming nurse was made aware of the situation.

## 2011-05-21 LAB — GLUCOSE, CAPILLARY: Glucose-Capillary: 143 mg/dL — ABNORMAL HIGH (ref 70–99)

## 2011-05-21 MED ORDER — INSULIN ASPART 100 UNIT/ML ~~LOC~~ SOLN: 4.0000 [IU] | Freq: Three times a day (TID) | SUBCUTANEOUS | Status: DC

## 2011-05-21 MED ORDER — OMEPRAZOLE 20 MG PO CPDR: 20.0000 mg | DELAYED_RELEASE_CAPSULE | Freq: Every day | ORAL | Status: DC

## 2011-05-21 NOTE — Discharge Summary (Signed)
Discharge Summary  Kenneth Fox MR#: [id number]  DOB:12-Sep-1970  Date of Admission: 05/17/2011 Date of Discharge: 05/21/2011  Patient's PCP: Geraldo Pitter, MD  Attending Physician:Semaja Lymon  Consults: None   Discharge Diagnoses: 1. Acute viral gastroenteritis versus gastroparesis, improved 2. Uncontrolled type 2 diabetes mellitus/insulin-dependent  3. Hypertension 4. Obesity 5. Tiny left lung base subpleural pulmonary nodule. Outpatient followup as deemed necessary 6. Fatty liver on CT of the abdomen.  Brief Admitting History and Physical Patient is a 40 year old African American male with history of long-standing diabetes mellitus, hypertension who presented with history of nausea, vomiting, abdominal pain and diarrhea. He has 2 small children who recently had episodes of upper respiratory tract infection and GI symptoms. He was thereby admitted for further evaluation and management.  Discharge Medications Current Discharge Medication List    START taking these medications   Details  insulin aspart (NOVOLOG) 100 UNIT/ML injection Inject 4 Units into the skin 3 (three) times daily with meals. Qty: 1 vial, Refills: 0    omeprazole (PRILOSEC) 20 MG capsule Take 1 capsule (20 mg total) by mouth daily. Qty: 30 capsule, Refills: 0      CONTINUE these medications which have CHANGED   Details  pregabalin (LYRICA) 75 MG capsule Take 1 capsule (75 mg total) by mouth every evening. Qty: 10 capsule, Refills: 0      CONTINUE these medications which have NOT CHANGED   Details  amLODipine (NORVASC) 5 MG tablet Take 5 mg by mouth daily.      Amlodipine-Valsartan-HCTZ (EXFORGE HCT) 10-320-25 MG TABS Take 1 tablet by mouth daily.      carvedilol (COREG) 6.25 MG tablet Take 6.25 mg by mouth 2 (two) times daily with a meal.      insulin glargine (LANTUS) 100 UNIT/ML injection Inject 80 Units into the skin at bedtime.      oxyCODONE-acetaminophen (PERCOCET) 10-325 MG per  tablet Take 1 tablet by mouth every 8 (eight) hours as needed. For pain     traZODone (DESYREL) 100 MG tablet Take 50-100 mg by mouth at bedtime.          Hospital Course: 1. Acute viral gastroenteritis: Given his history of sick exposure at home, this was suspicious for acute viral gastroenteritis. He was treated symptomatically and clinically improved. He was almost discharged on December 11 but complained of significant nausea. His discharge was held. He had a CT of the abdomen and pelvis with contrast. This did not show any acute findings. Patient has tolerated a regular diet. He has no nausea. He has had the normal colored soft BM. He complains of epigastric in periumbilical burning pain after eating. His never had the EGD. He does not abuse NSAIDs. He also complains of gaseous discomfort at times. The other differential diagnosis would be gastroparesis, gastritis and peptic ulcer disease. He is advised to followup with his primary care physician and if his symptoms persist or get worse then consider outpatient gastroenterology consultation for EGD. 2. Uncontrolled type 2 diabetes mellitus/insulin-dependent. Mealtime NovoLog has been added to his Lantus. Patient is advised to monitor his blood sugars closely and these can be titrated as an outpatient. 3. Hypertension: Patient on polypharmacy and reasonably controlled. 4. Obesity  Day of Discharge BP 160/93  Pulse 89  Temp(Src) 97.8 F (36.6 C) (Oral)  Resp 20  Ht 5\' 10"  (1.778 m)  Wt 145.151 kg (320 lb)  BMI 45.92 kg/m2  SpO2 97%  General exam: Patient ambulating in the room and is  in no obvious distress. Respiratory system: Clear. No increased work of breathing.  Cardiovascular system: S1 and S2 heard, regular. No JVD.  Gastrointestinal system: Abdomen is mildly obese, soft and nontender. Normal bowel sounds heard. Central nervous system: Alert and oriented. No focal neurological deficits   Results for orders placed during the  hospital encounter of 05/17/11 (from the past 48 hour(s))  GLUCOSE, CAPILLARY     Status: Abnormal   Collection Time   05/19/11  9:18 PM      Component Value Range Comment   Glucose-Capillary 208 (*) 70 - 99 (mg/dL)   GLUCOSE, CAPILLARY     Status: Abnormal   Collection Time   05/20/11  6:36 AM      Component Value Range Comment   Glucose-Capillary 153 (*) 70 - 99 (mg/dL)   GLUCOSE, CAPILLARY     Status: Abnormal   Collection Time   05/20/11 11:16 AM      Component Value Range Comment   Glucose-Capillary 208 (*) 70 - 99 (mg/dL)   COMPREHENSIVE METABOLIC PANEL     Status: Abnormal   Collection Time   05/20/11 12:52 PM      Component Value Range Comment   Sodium 136  135 - 145 (mEq/L)    Potassium 3.4 (*) 3.5 - 5.1 (mEq/L)    Chloride 100  96 - 112 (mEq/L)    CO2 28  19 - 32 (mEq/L)    Glucose, Bld 193 (*) 70 - 99 (mg/dL)    BUN 10  6 - 23 (mg/dL)    Creatinine, Ser 4.09  0.50 - 1.35 (mg/dL)    Calcium 9.5  8.4 - 10.5 (mg/dL)    Total Protein 7.9  6.0 - 8.3 (g/dL)    Albumin 3.4 (*) 3.5 - 5.2 (g/dL)    AST 15  0 - 37 (U/L)    ALT 14  0 - 53 (U/L)    Alkaline Phosphatase 87  39 - 117 (U/L)    Total Bilirubin 0.3  0.3 - 1.2 (mg/dL)    GFR calc non Af Amer >90  >90 (mL/min)    GFR calc Af Amer >90  >90 (mL/min)   LIPASE, BLOOD     Status: Normal   Collection Time   05/20/11 12:52 PM      Component Value Range Comment   Lipase 35  11 - 59 (U/L)   MAGNESIUM     Status: Normal   Collection Time   05/20/11 12:52 PM      Component Value Range Comment   Magnesium 2.1  1.5 - 2.5 (mg/dL)   GLUCOSE, CAPILLARY     Status: Abnormal   Collection Time   05/20/11  2:50 PM      Component Value Range Comment   Glucose-Capillary 182 (*) 70 - 99 (mg/dL)   GLUCOSE, CAPILLARY     Status: Abnormal   Collection Time   05/20/11  5:06 PM      Component Value Range Comment   Glucose-Capillary 165 (*) 70 - 99 (mg/dL)    Comment 1 Notify RN     GLUCOSE, CAPILLARY     Status: Abnormal    Collection Time   05/20/11  6:45 PM      Component Value Range Comment   Glucose-Capillary 169 (*) 70 - 99 (mg/dL)   GLUCOSE, CAPILLARY     Status: Abnormal   Collection Time   05/20/11 10:18 PM      Component Value Range Comment   Glucose-Capillary  174 (*) 70 - 99 (mg/dL)    Comment 1 Documented in Chart      Comment 2 Notify RN     GLUCOSE, CAPILLARY     Status: Abnormal   Collection Time   05/21/11  7:42 AM      Component Value Range Comment   Glucose-Capillary 116 (*) 70 - 99 (mg/dL)   GLUCOSE, CAPILLARY     Status: Abnormal   Collection Time   05/21/11 11:29 AM      Component Value Range Comment   Glucose-Capillary 143 (*) 70 - 99 (mg/dL)     Ct Abdomen Pelvis W Contrast  05/20/2011  *RADIOLOGY REPORT*  Clinical Data: Evaluate for obstruction or colitis.  CT ABDOMEN AND PELVIS WITH CONTRAST   IMPRESSION:  1.  No acute process, evidence of bowel obstruction, or colitis. 2.  Hepatic steatosis. 3.  Tiny left lung base subpleural nodule. If the patient is at high risk for bronchogenic carcinoma, follow-up chest CT at 1 year is recommended.  If the patient is at low risk, no follow-up is needed.   This recommendation follows the consensus statement: "Guidelines for Management of Small Pulmonary Nodules Detected on CT Scans:  A Statement from the Fleischner Society" as published in Radiology 2005; 237:395-400.  Available online at: DietDisorder.cz.  Original Report Authenticated By: Consuello Bossier, M.D.   Dg Abd 2 Views  05/20/2011  **ADDENDUM** CREATED: 05/20/2011 00:38:28  Findings were discussed by telephone with Dr. Adela Glimpse.  She reports the patient is having nausea and vomiting and is unable to tolerate meals.  Although no gaseous small bowel dilatation is evident, fluid-filled small bowel would be inapparent on x-ray.  CT imaging may prove helpful to further evaluate.  **END ADDENDUM** SIGNED BY: Minerva Areola A. Molli Posey, M.D.   05/20/2011  *RADIOLOGY  REPORT*  Clinical Data: Pain.  ABDOMEN - 2 VIEW  Comparison: No comparison studies available.  Findings: Upright film shows no evidence for intraperitoneal free air.  Supine film shows no gaseous small bowel dilatation to suggest small bowel obstruction.  Air is visualized in a nondilated transverse colon.  The patient is status post ORIF for right acetabular fracture.  IMPRESSION: No evidence for intraperitoneal free air or bowel obstruction.  Original Report Authenticated By: ERIC A. MANSELL, M.D.     Disposition: Patient is discharged home in stable condition  Diet: Heart healthy and diabetic  Activity: As tolerated  Follow-up Appts: Discharge Orders    Future Orders Please Complete By Expires   Diet - low sodium heart healthy      Diet Carb Modified      Increase activity slowly      Call MD for:  persistant nausea and vomiting      Call MD for:  severe uncontrolled pain         TESTS THAT NEED FOLLOW-UP None  Time spent on discharge, talking to the patient, and coordinating care: 30 mins.   SignedMarcellus [name], MD 05/21/2011, 3:56 PM

## 2011-05-21 NOTE — Progress Notes (Signed)
Utilization Review Completed.Kenneth Fox T12/13/2012   

## 2011-05-22 LAB — GLUCOSE, CAPILLARY: Glucose-Capillary: 159 mg/dL — ABNORMAL HIGH (ref 70–99)

## 2011-10-01 ENCOUNTER — Other Ambulatory Visit (HOSPITAL_COMMUNITY): Payer: Self-pay | Admitting: Internal Medicine

## 2011-11-04 ENCOUNTER — Emergency Department (HOSPITAL_COMMUNITY)
Admission: EM | Admit: 2011-11-04 | Discharge: 2011-11-04 | Discharge status: Against Medical Advice | Payer: Medicaid Other | Attending: Emergency Medicine | Admitting: Emergency Medicine

## 2011-11-04 ENCOUNTER — Encounter (HOSPITAL_COMMUNITY): Payer: Self-pay | Admitting: Emergency Medicine

## 2011-11-04 DIAGNOSIS — W230XXA Caught, crushed, jammed, or pinched between moving objects, initial encounter: Secondary | ICD-10-CM | POA: Insufficient documentation

## 2011-11-04 DIAGNOSIS — S6980XA Other specified injuries of unspecified wrist, hand and finger(s), initial encounter: Secondary | ICD-10-CM | POA: Insufficient documentation

## 2011-11-04 DIAGNOSIS — S6990XA Unspecified injury of unspecified wrist, hand and finger(s), initial encounter: Secondary | ICD-10-CM | POA: Insufficient documentation

## 2011-11-04 NOTE — ED Notes (Signed)
Pt st's he burned left index finger on grill 2 days ago then mashed it in a door yesterday.  Pt has swelling and infection around nailbed.

## 2011-11-07 ENCOUNTER — Emergency Department (HOSPITAL_COMMUNITY)
Admission: EM | Admit: 2011-11-07 | Discharge: 2011-11-07 | Disposition: A | Discharge status: Home / Self Care | Payer: Medicaid Other | Attending: Emergency Medicine | Admitting: Emergency Medicine

## 2011-11-07 ENCOUNTER — Emergency Department (HOSPITAL_COMMUNITY): Payer: Medicaid Other

## 2011-11-07 ENCOUNTER — Encounter (HOSPITAL_COMMUNITY): Payer: Self-pay | Admitting: Emergency Medicine

## 2011-11-07 DIAGNOSIS — E119 Type 2 diabetes mellitus without complications: Secondary | ICD-10-CM | POA: Insufficient documentation

## 2011-11-07 DIAGNOSIS — J45909 Unspecified asthma, uncomplicated: Secondary | ICD-10-CM | POA: Insufficient documentation

## 2011-11-07 DIAGNOSIS — Z794 Long term (current) use of insulin: Secondary | ICD-10-CM | POA: Insufficient documentation

## 2011-11-07 DIAGNOSIS — I10 Essential (primary) hypertension: Secondary | ICD-10-CM | POA: Insufficient documentation

## 2011-11-07 DIAGNOSIS — L03019 Cellulitis of unspecified finger: Secondary | ICD-10-CM | POA: Insufficient documentation

## 2011-11-07 DIAGNOSIS — IMO0002 Reserved for concepts with insufficient information to code with codable children: Secondary | ICD-10-CM

## 2011-11-07 HISTORY — DX: Polyneuropathy, unspecified: G62.9

## 2011-11-07 MED ORDER — SULFAMETHOXAZOLE-TRIMETHOPRIM 800-160 MG PO TABS: 2.0000 | ORAL_TABLET | Freq: Two times a day (BID) | ORAL | Status: AC

## 2011-11-07 NOTE — ED Provider Notes (Signed)
History   This chart was scribed for Kenneth Quarry, MD by Toya Smothers. The patient was seen in room STRE1/STRE1. Patient's care was started at 0936.  CSN: [id number]  Arrival date & time 11/07/11  0865   First MD Initiated Contact with Patient 11/07/11 1117      Chief Complaint  Patient presents with  . Finger Injury   HPI  Kenneth Fox is a 41 y.o. male who presents to the Emergency Department complaining of constant moderate severe pain onset 7 days ago with associate swelling and puss, denying fevere. Pt states that he slammed his finger in the door causing injury, and that he is concerned that the puss coming out of his finger may be a sign of infection. Pt list a h/o Diabetes, Hypertension, and Asthma.  Pt list PC as Dr. Randa Lynn Past Medical History  Diagnosis Date  . Diabetes mellitus   . Hypertension   . Asthma     No past surgical history on file.  No family history on file.  History  Substance Use Topics  . Smoking status: Never Smoker   . Smokeless tobacco: Not on file  . Alcohol Use: No    Review of Systems  Constitutional: Negative for fever and chills.  Respiratory: Negative for shortness of breath.   Gastrointestinal: Negative for nausea and vomiting.  Skin: Positive for wound (L indezx finger).  Neurological: Negative for weakness.    Allergies  Review of patient's allergies indicates no known allergies.  Home Medications   Current Outpatient Rx  Name Route Sig Dispense Refill  . AMLODIPINE-VALSARTAN-HCTZ 10-320-25 MG PO TABS Oral Take 1 tablet by mouth daily.      . INDAPAMIDE 1.25 MG PO TABS Oral Take 1.25 mg by mouth daily.    . INSULIN ASPART 100 UNIT/ML [place] SOLN Subcutaneous Inject 10-28 Units into the skin 3 (three) times daily with meals. Takes 10-28 units per sliding scale    . INSULIN GLARGINE 100 UNIT/ML [place] SOLN Subcutaneous Inject 80 Units into the skin at bedtime.      . OXYCODONE-ACETAMINOPHEN 10-325 MG PO TABS Oral Take 0.5-1 tablets  by mouth every 8 (eight) hours as needed. For pain    . PREGABALIN 200 MG PO CAPS Oral Take 200 mg by mouth 2 (two) times daily.    [name] TIZANIDINE HCL 4 MG PO TABS Oral Take 4 mg by mouth at bedtime as needed. For muscle spasms      BP 144/78  Pulse 93  Temp(Src) 98 F (36.7 C) (Oral)  Resp 20  SpO2 95%  Physical Exam  Nursing note and vitals reviewed. Constitutional: He is oriented to person, place, and time. He appears well-developed and well-nourished. No distress.  HENT:  Head: Normocephalic and atraumatic.  Eyes: EOM are normal.  Neck: Neck supple. No tracheal deviation present.  Cardiovascular: Normal rate.   Pulmonary/Chest: Effort normal. No respiratory distress.  Musculoskeletal: Normal range of motion.  Neurological: He is alert and oriented to person, place, and time.  Skin: Skin is warm and dry.       Perenical swelling tenderness of the L index finger. Sensation is intact. Full ROm. Good cap refill.  Psychiatric: He has a normal mood and affect. His behavior is normal.    ED Course  Procedures (including critical care time) DIAGNOSTIC STUDIES: Oxygen Saturation is 95% on room air, adequate by my interpretation.    COORDINATION OF CARE: WEST,EMILY B 12:25 PM Patient placed in CDU for I&D  of paronychia.    INCISION AND DRAINAGE Performed by: Rise Patience Consent: Verbal consent obtained. Risks and benefits: risks, benefits and alternatives were discussed Type: paronychia  Body area: left index finger  Anesthesia: digital block  Local anesthetic: lidocaine 2% no epinephrine  Anesthetic total: 5 ml  Complexity: simple  Drainage: purulent  Drainage amount: large  Packing material: none  Patient tolerance: Patient tolerated the procedure well with no immediate complications.  12:53 PM Discussed wound care, return precautions.  Pt is a diabetic, will d/c home with Bactrim.  Culture sent in the event that patient has complications or worsening symptoms.   Pt to follow up with PCP, return to ER for worsening condition.  Patient verbalizes understanding and agrees with plan.    End section by WEST,EMILY B     Labs Reviewed - No data to display No results found.   1. Paronychia      MDM  I personally performed the services described in this documentation, which was scribed in my presence. The recorded information has been reviewed and considered.       Kenneth Quarry, MD 11/20/11 [id number]

## 2011-11-07 NOTE — Discharge Instructions (Signed)
Read the information below.  Use tylenol or ibuprofen as needed for pain.  Keep the wound clean and covered until it heals.  Soak it in warm soapy water several times daily.  Return to the ER immediately if you develop redness, swelling, difficulty moving your finger, pus building up in the wound, or fevers greater than 100.4.  You may return to the ER at any time for worsening condition or any new symptoms that concern you.

## 2011-11-07 NOTE — ED Notes (Signed)
Pt. Stated I hurt my lt index finger some how.  I think I burned it but I'm not sure

## 2011-11-09 LAB — WOUND CULTURE: Special Requests: NORMAL

## 2011-11-10 NOTE — ED Notes (Signed)
+   Urine Patient treated with septra-sensitive to same-chart appended per protocol MD. 

## 2011-11-30 ENCOUNTER — Encounter (HOSPITAL_COMMUNITY): Payer: Self-pay | Admitting: *Deleted

## 2011-11-30 ENCOUNTER — Emergency Department (HOSPITAL_COMMUNITY)
Admission: EM | Admit: 2011-11-30 | Discharge: 2011-11-30 | Disposition: A | Discharge status: Home / Self Care | Payer: No Typology Code available for payment source | Attending: Emergency Medicine | Admitting: Emergency Medicine

## 2011-11-30 DIAGNOSIS — M542 Cervicalgia: Secondary | ICD-10-CM | POA: Insufficient documentation

## 2011-11-30 DIAGNOSIS — E119 Type 2 diabetes mellitus without complications: Secondary | ICD-10-CM | POA: Insufficient documentation

## 2011-11-30 DIAGNOSIS — M545 Low back pain, unspecified: Secondary | ICD-10-CM | POA: Insufficient documentation

## 2011-11-30 DIAGNOSIS — I10 Essential (primary) hypertension: Secondary | ICD-10-CM | POA: Insufficient documentation

## 2011-11-30 DIAGNOSIS — J45909 Unspecified asthma, uncomplicated: Secondary | ICD-10-CM | POA: Insufficient documentation

## 2011-11-30 DIAGNOSIS — Z794 Long term (current) use of insulin: Secondary | ICD-10-CM | POA: Insufficient documentation

## 2011-11-30 DIAGNOSIS — R0789 Other chest pain: Secondary | ICD-10-CM | POA: Insufficient documentation
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
## 2012-08-15 ENCOUNTER — Emergency Department (HOSPITAL_COMMUNITY)
Admission: EM | Admit: 2012-08-15 | Discharge: 2012-08-15 | Discharge status: Against Medical Advice | Payer: Medicaid Other | Attending: Emergency Medicine | Admitting: Emergency Medicine

## 2012-08-15 ENCOUNTER — Encounter (HOSPITAL_COMMUNITY): Payer: Self-pay | Admitting: Emergency Medicine

## 2012-08-15 DIAGNOSIS — J45909 Unspecified asthma, uncomplicated: Secondary | ICD-10-CM | POA: Insufficient documentation

## 2012-08-15 DIAGNOSIS — Z760 Encounter for issue of repeat prescription: Secondary | ICD-10-CM | POA: Insufficient documentation

## 2012-08-15 DIAGNOSIS — I10 Essential (primary) hypertension: Secondary | ICD-10-CM | POA: Insufficient documentation

## 2012-08-15 DIAGNOSIS — E119 Type 2 diabetes mellitus without complications: Secondary | ICD-10-CM | POA: Insufficient documentation

## 2012-08-15 LAB — GLUCOSE, CAPILLARY
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
## 2012-08-15 NOTE — ED Notes (Signed)
Pt called by RN for third time, no response.

## 2012-08-15 NOTE — ED Notes (Signed)
Pt called by RN x2, no response from patient.

## 2012-08-15 NOTE — ED Notes (Signed)
Pt called by RN x1, no response.

## 2012-10-01 ENCOUNTER — Emergency Department (HOSPITAL_COMMUNITY)
Admission: EM | Admit: 2012-10-01 | Discharge: 2012-10-02 | Disposition: A | Discharge status: Home / Self Care | Payer: Medicaid Other | Attending: Emergency Medicine | Admitting: Emergency Medicine

## 2012-10-01 ENCOUNTER — Encounter (HOSPITAL_COMMUNITY): Payer: Self-pay | Admitting: *Deleted

## 2012-10-01 DIAGNOSIS — Z794 Long term (current) use of insulin: Secondary | ICD-10-CM | POA: Insufficient documentation

## 2012-10-01 DIAGNOSIS — R739 Hyperglycemia, unspecified: Secondary | ICD-10-CM

## 2012-10-01 DIAGNOSIS — E1169 Type 2 diabetes mellitus with other specified complication: Secondary | ICD-10-CM | POA: Insufficient documentation

## 2012-10-01 DIAGNOSIS — Z79899 Other long term (current) drug therapy: Secondary | ICD-10-CM | POA: Insufficient documentation

## 2012-10-01 DIAGNOSIS — J45909 Unspecified asthma, uncomplicated: Secondary | ICD-10-CM | POA: Insufficient documentation

## 2012-10-01 DIAGNOSIS — G609 Hereditary and idiopathic neuropathy, unspecified: Secondary | ICD-10-CM | POA: Insufficient documentation

## 2012-10-01 DIAGNOSIS — I10 Essential (primary) hypertension: Secondary | ICD-10-CM | POA: Insufficient documentation

## 2012-10-01 DIAGNOSIS — G8929 Other chronic pain: Secondary | ICD-10-CM | POA: Insufficient documentation

## 2012-10-01 LAB — GLUCOSE, CAPILLARY: Glucose-Capillary: 539 mg/dL — ABNORMAL HIGH (ref 70–99)

## 2012-10-01 MED ORDER — INSULIN ASPART 100 UNIT/ML ~~LOC~~ SOLN
10.0000 [IU] | Freq: Once | SUBCUTANEOUS | Status: AC
  Administered 2012-10-01: 10 [IU] via SUBCUTANEOUS
  Filled 2012-10-01: qty 1

## 2012-10-01 MED ORDER — OXYCODONE-ACETAMINOPHEN 5-325 MG PO TABS
2.0000 | ORAL_TABLET | Freq: Once | ORAL | Status: AC
  Administered 2012-10-01: 2 via ORAL
  Filled 2012-10-01: qty 2

## 2012-10-01 MED ORDER — SODIUM CHLORIDE 0.9 % IV BOLUS (SEPSIS)
1000.0000 mL | Freq: Once | INTRAVENOUS | Status: AC
  Administered 2012-10-01: 1000 mL via INTRAVENOUS

## 2012-10-01 NOTE — ED Notes (Addendum)
CBG was 539.

## 2012-10-01 NOTE — ED Notes (Signed)
Pt states he needs his prescription refilled
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
## 2012-10-02 LAB — POCT I-STAT, CHEM 8
Calcium, Ion: 1.14 mmol/L (ref 1.12–1.23)
HCT: 39 % (ref 39.0–52.0)
Sodium: 137 mEq/L (ref 135–145)
TCO2: 30 mmol/L (ref 0–100)

## 2012-10-02 LAB — GLUCOSE, CAPILLARY: Glucose-Capillary: 360 mg/dL — ABNORMAL HIGH (ref 70–99)

## 2012-10-02 MED ORDER — SODIUM CHLORIDE 0.9 % IV BOLUS (SEPSIS)
1000.0000 mL | Freq: Once | INTRAVENOUS | Status: AC
  Administered 2012-10-02: 1000 mL via INTRAVENOUS

## 2012-10-02 MED ORDER — OXYCODONE-ACETAMINOPHEN 10-325 MG PO TABS: 1.0000 | ORAL_TABLET | ORAL | Status: DC | PRN

## 2012-10-02 NOTE — ED Notes (Signed)
CBG was 221.

## 2012-10-03 NOTE — ED Provider Notes (Signed)
Medical screening examination/treatment/procedure(s) were performed by non-physician practitioner and as supervising physician I was immediately available for consultation/collaboration.  John-Adam Jemina Scahill, M.D.   John-Adam Eudell Julian, MD 10/03/12 0750 

## 2012-10-10 ENCOUNTER — Ambulatory Visit: Payer: Medicaid Other | Admitting: *Deleted

## 2012-11-05 ENCOUNTER — Emergency Department (HOSPITAL_COMMUNITY)
Admission: EM | Admit: 2012-11-05 | Discharge: 2012-11-05 | Disposition: A | Discharge status: Home / Self Care | Payer: Medicaid Other | Attending: Emergency Medicine | Admitting: Emergency Medicine

## 2012-11-05 ENCOUNTER — Encounter (HOSPITAL_COMMUNITY): Payer: Self-pay | Admitting: *Deleted

## 2012-11-05 DIAGNOSIS — R197 Diarrhea, unspecified: Secondary | ICD-10-CM | POA: Insufficient documentation

## 2012-11-05 DIAGNOSIS — R5381 Other malaise: Secondary | ICD-10-CM | POA: Insufficient documentation

## 2012-11-05 DIAGNOSIS — R112 Nausea with vomiting, unspecified: Secondary | ICD-10-CM

## 2012-11-05 DIAGNOSIS — Z794 Long term (current) use of insulin: Secondary | ICD-10-CM | POA: Insufficient documentation

## 2012-11-05 DIAGNOSIS — F329 Major depressive disorder, single episode, unspecified: Secondary | ICD-10-CM | POA: Insufficient documentation

## 2012-11-05 DIAGNOSIS — E1169 Type 2 diabetes mellitus with other specified complication: Secondary | ICD-10-CM | POA: Insufficient documentation

## 2012-11-05 DIAGNOSIS — G4730 Sleep apnea, unspecified: Secondary | ICD-10-CM | POA: Insufficient documentation

## 2012-11-05 DIAGNOSIS — F3289 Other specified depressive episodes: Secondary | ICD-10-CM | POA: Insufficient documentation

## 2012-11-05 DIAGNOSIS — Z79899 Other long term (current) drug therapy: Secondary | ICD-10-CM | POA: Insufficient documentation

## 2012-11-05 DIAGNOSIS — J45909 Unspecified asthma, uncomplicated: Secondary | ICD-10-CM | POA: Insufficient documentation

## 2012-11-05 DIAGNOSIS — G609 Hereditary and idiopathic neuropathy, unspecified: Secondary | ICD-10-CM | POA: Insufficient documentation

## 2012-11-05 DIAGNOSIS — R5383 Other fatigue: Secondary | ICD-10-CM | POA: Insufficient documentation

## 2012-11-05 DIAGNOSIS — F411 Generalized anxiety disorder: Secondary | ICD-10-CM | POA: Insufficient documentation

## 2012-11-05 DIAGNOSIS — R630 Anorexia: Secondary | ICD-10-CM | POA: Insufficient documentation

## 2012-11-05 DIAGNOSIS — I10 Essential (primary) hypertension: Secondary | ICD-10-CM | POA: Insufficient documentation

## 2012-11-05 DIAGNOSIS — D573 Sickle-cell trait: Secondary | ICD-10-CM | POA: Insufficient documentation

## 2012-11-05 DIAGNOSIS — E876 Hypokalemia: Secondary | ICD-10-CM | POA: Insufficient documentation

## 2012-11-05 DIAGNOSIS — R509 Fever, unspecified: Secondary | ICD-10-CM | POA: Insufficient documentation

## 2012-11-05 DIAGNOSIS — R739 Hyperglycemia, unspecified: Secondary | ICD-10-CM

## 2012-11-05 LAB — COMPREHENSIVE METABOLIC PANEL WITH GFR
ALT: 16 U/L (ref 0–53)
AST: 14 U/L (ref 0–37)
Albumin: 3.7 g/dL (ref 3.5–5.2)
Alkaline Phosphatase: 116 U/L (ref 39–117)
CO2: 29 meq/L (ref 19–32)
Chloride: 92 meq/L — ABNORMAL LOW (ref 96–112)
GFR calc non Af Amer: 63 mL/min — ABNORMAL LOW (ref >90)
Potassium: 3.1 meq/L — ABNORMAL LOW (ref 3.5–5.1)
Sodium: 133 meq/L — ABNORMAL LOW (ref 135–145)
Total Bilirubin: 0.4 mg/dL (ref 0.3–1.2)

## 2012-11-05 LAB — CBC WITH DIFFERENTIAL/PLATELET
Basophils Absolute: 0 10*3/uL (ref 0.0–0.1)
Basophils Relative: 0 % (ref 0–1)
Eosinophils Absolute: 0.1 K/uL (ref 0.0–0.7)
Eosinophils Relative: 1 % (ref 0–5)
HCT: 43.6 % (ref 39.0–52.0)
Hemoglobin: 15.4 g/dL (ref 13.0–17.0)
Lymphocytes Relative: 20 % (ref 12–46)
Lymphs Abs: 2 10*3/uL (ref 0.7–4.0)
MCH: 27.6 pg (ref 26.0–34.0)
MCHC: 35.3 g/dL (ref 30.0–36.0)
MCV: 78.3 fL (ref 78.0–100.0)
Monocytes Absolute: 1.1 10*3/uL — ABNORMAL HIGH (ref 0.1–1.0)
Monocytes Relative: 11 % (ref 3–12)
Neutro Abs: 6.8 10*3/uL (ref 1.7–7.7)
Neutrophils Relative %: 69 % (ref 43–77)
Platelets: 240 K/uL (ref 150–400)
RBC: 5.57 MIL/uL (ref 4.22–5.81)
RDW: 14.4 % (ref 11.5–15.5)
WBC: 10 10*3/uL (ref 4.0–10.5)

## 2012-11-05 LAB — URINALYSIS, ROUTINE W REFLEX MICROSCOPIC
Glucose, UA: NEGATIVE mg/dL
Hgb urine dipstick: NEGATIVE
Ketones, ur: NEGATIVE mg/dL
Leukocytes, UA: NEGATIVE
Nitrite: NEGATIVE
Protein, ur: 100 mg/dL — AB
Specific Gravity, Urine: 1.016 (ref 1.005–1.030)
Urobilinogen, UA: 1 mg/dL (ref 0.0–1.0)
pH: 5.5 (ref 5.0–8.0)

## 2012-11-05 LAB — GLUCOSE, CAPILLARY
Glucose-Capillary: 241 mg/dL — ABNORMAL HIGH (ref 70–99)
Glucose-Capillary: 192 mg/dL — ABNORMAL HIGH (ref 70–99)

## 2012-11-05 LAB — COMPREHENSIVE METABOLIC PANEL
BUN: 20 mg/dL (ref 6–23)
Calcium: 9.7 mg/dL (ref 8.4–10.5)
Creatinine, Ser: 1.35 mg/dL (ref 0.50–1.35)
GFR calc Af Amer: 73 mL/min — ABNORMAL LOW (ref >90)
Glucose, Bld: 245 mg/dL — ABNORMAL HIGH (ref 70–99)
Total Protein: 8.4 g/dL — ABNORMAL HIGH (ref 6.0–8.3)

## 2012-11-05 LAB — URINE MICROSCOPIC-ADD ON

## 2012-11-05 MED ORDER — SODIUM CHLORIDE 0.9 % IV BOLUS (SEPSIS)
1000.0000 mL | Freq: Once | INTRAVENOUS | Status: AC
  Administered 2012-11-05: 1000 mL via INTRAVENOUS

## 2012-11-05 MED ORDER — ONDANSETRON 4 MG PO TBDP: 4.0000 mg | ORAL_TABLET | Freq: Three times a day (TID) | ORAL | Status: DC | PRN

## 2012-11-05 MED ORDER — GI COCKTAIL ~~LOC~~
30.0000 mL | Freq: Once | ORAL | Status: AC
  Administered 2012-11-05: 30 mL via ORAL
  Filled 2012-11-05: qty 30

## 2012-11-05 MED ORDER — FAMOTIDINE 20 MG PO TABS
40.0000 mg | ORAL_TABLET | Freq: Once | ORAL | Status: AC
  Administered 2012-11-05: 40 mg via ORAL
  Filled 2012-11-05: qty 2

## 2012-11-05 MED ORDER — METOCLOPRAMIDE HCL 5 MG/ML IJ SOLN
10.0000 mg | Freq: Once | INTRAMUSCULAR | Status: AC
  Administered 2012-11-05: 10 mg via INTRAVENOUS
  Filled 2012-11-05: qty 2

## 2012-11-05 MED ORDER — POTASSIUM CHLORIDE CRYS ER 20 MEQ PO TBCR
40.0000 meq | EXTENDED_RELEASE_TABLET | Freq: Once | ORAL | Status: AC
  Administered 2012-11-05: 40 meq via ORAL
  Filled 2012-11-05: qty 2

## 2012-11-05 MED ORDER — NYSTATIN 100000 UNIT/ML MT SUSP: 500000.0000 [IU] | Freq: Four times a day (QID) | OROMUCOSAL | Status: DC

## 2012-11-05 NOTE — ED Notes (Signed)
Pt informed of need for urine specimen  

## 2012-11-05 NOTE — ED Provider Notes (Signed)
  Physical Exam  BP 138/84  Pulse 91  Temp(Src) 98.4 F (36.9 C) (Oral)  Resp 16  SpO2 98%  Physical Exam I was asked to review the  patient's urine results which is negative.  His blood sugar at time of discharge is 199.  I discussed this with the patient ED Course  Procedures  MDM       Arman Filter, NP 11/05/12 2121  Arman Filter, NP 11/05/12 2151

## 2012-11-05 NOTE — ED Provider Notes (Signed)
History     CSN: [id number]  Arrival date & time 11/05/12  1654   First MD Initiated Contact with Patient 11/05/12 1739      CC: Nausea, vomiting, diarrhea, weakness  (Consider location/radiation/quality/duration/timing/severity/associated sxs/prior treatment) HPI Comments: Patient with history of diabetes, neuropathy -- presents with complaint of nausea and vomiting, watery stool, subjective fever for the past week. He attributes his symptoms to food that he had eaten prior to initiation of symptoms. He has been able to drink but feels very sick after eating solids. He states that his sugars have been running high in the 200s. He denies headache, cold symptoms, chest pain, shortness of breath, abdominal pain, urinary symptoms, rashes. No treatments prior to arrival. Patient states that he just started Cymbalta. Onset of symptoms gradual. Course is constant. Nothing makes symptoms better or worse.  The history is provided by the patient and medical records.    Past Medical History  Diagnosis Date  . Diabetes mellitus   . Hypertension   . Asthma   . Neuropathy   . Anxiety   . Depression     History reviewed. No pertinent past surgical history.  No family history on file.  History  Substance Use Topics  . Smoking status: Never Smoker   . Smokeless tobacco: Not on file  . Alcohol Use: No      Review of Systems  Constitutional: Positive for fever (subjective), appetite change and fatigue.  HENT: Negative for sore throat and rhinorrhea.   Eyes: Negative for redness.  Respiratory: Negative for cough.   Cardiovascular: Negative for chest pain.  Gastrointestinal: Positive for nausea, vomiting and diarrhea. Negative for abdominal pain and blood in stool.  Genitourinary: Negative for dysuria, frequency and decreased urine volume.  Musculoskeletal: Negative for myalgias.  Skin: Negative for rash.  Neurological: Negative for headaches.    Allergies  Review of patient's  allergies indicates no known allergies.  Home Medications   Current Outpatient Rx  Name  Route  Sig  Dispense  Refill  . Amlodipine-Valsartan-HCTZ (EXFORGE HCT) 10-320-25 MG TABS   Oral   Take 1 tablet by mouth daily.           . DULoxetine (CYMBALTA) 60 MG capsule   Oral   Take 60 mg by mouth 2 (two) times daily.         . indapamide (LOZOL) 1.25 MG tablet   Oral   Take 1.25 mg by mouth daily.         . insulin aspart (NOVOLOG) 100 UNIT/ML injection   Subcutaneous   Inject 10-20 Units into the skin 3 (three) times daily with meals. 10-20 units per sliding scale         . insulin glargine (LANTUS) 100 UNIT/ML injection   Subcutaneous   Inject 80 Units into the skin at bedtime.           . meloxicam (MOBIC) 7.5 MG tablet   Oral   Take 7.5 mg by mouth 2 (two) times daily.         . metFORMIN (GLUCOPHAGE) 1000 MG tablet   Oral   Take 1,000 mg by mouth 2 (two) times daily with a meal.         . Oxycodone HCl 10 MG TABS   Oral   Take 10 mg by mouth every 6 (six) hours as needed (for pain).         [name] tiZANidine (ZANAFLEX) 4 MG tablet   Oral   Take 8  mg by mouth at bedtime. For muscle spasms           BP 115/91  Pulse 90  Temp(Src) 98.4 F (36.9 C) (Oral)  Resp 20  SpO2 95%  Physical Exam  Nursing note and vitals reviewed. Constitutional: He appears well-developed and well-nourished.  HENT:  Head: Normocephalic and atraumatic.  Right Ear: External ear normal.  Left Ear: External ear normal.  Nose: Nose normal.  Mucous membranes dry. White coating on tongue.  Eyes: Conjunctivae are normal. Pupils are equal, round, and reactive to light. Right eye exhibits no discharge. Left eye exhibits no discharge.  Neck: Normal range of motion. Neck supple.  Cardiovascular: Normal rate, regular rhythm and normal heart sounds.   Pulmonary/Chest: Effort normal and breath sounds normal. No respiratory distress. He has no wheezes. He has no rales.  Abdominal:  Soft. Bowel sounds are normal. He exhibits no distension. There is no tenderness. There is no rebound and no guarding.  Musculoskeletal: He exhibits no edema and no tenderness.  Lymphadenopathy:    He has no cervical adenopathy.  Neurological: He is alert.  Skin: Skin is warm and dry.  Psychiatric: He has a normal mood and affect.    ED Course  Procedures (including critical care time)  Labs Reviewed  COMPREHENSIVE METABOLIC PANEL - Abnormal; Notable for the following:    Sodium 133 (*)    Potassium 3.1 (*)    Chloride 92 (*)    Glucose, Bld 245 (*)    Total Protein 8.4 (*)    GFR calc non Af Amer 63 (*)    GFR calc Af Amer 73 (*)    All other components within normal limits  CBC WITH DIFFERENTIAL - Abnormal; Notable for the following:    Monocytes Absolute 1.1 (*)    All other components within normal limits  GLUCOSE, CAPILLARY - Abnormal; Notable for the following:    Glucose-Capillary 241 (*)    All other components within normal limits  LIPASE, BLOOD  URINALYSIS, ROUTINE W REFLEX MICROSCOPIC   No results found.   1. Hyperglycemia without ketosis   2. Fatigue   3. Hypokalemia   4. Nausea vomiting and diarrhea     6:03 PM Patient seen and examined. Work-up initiated. Medications ordered.   Vital signs reviewed and are as follows: Filed Vitals:   11/05/12 1730  BP: 117/80  Pulse: 95  Temp:   Resp: 16   8:18 PM Pt informed of results. States he feels better after fluids (2L). Spoke with wife regarding concerns.   Urged pt to f/u with Dr. Parke Simmers early next week. He states he can do this.   Urged pt to call doctor that prescribed Cymbalta to discuss side effects. I told patient he could discontinue or continue to see if side effects improve.   8:23 PM Handoff to Manus Rudd NP at shift change. Will d/c when urine results.   Nystatin written for oral thrush.   BP 117/80  Pulse 95  Temp(Src) 98.4 F (36.9 C) (Oral)  Resp 16  SpO2 96%   MDM  Patient with  recent N/V/D, hyperglycemia without evidence of ketosis. He has hypokalemia, however no indications for admission. Do not suspect serotonin syndrome, no other concurrent use of psychogenic medications. Patient appears mildly dehydrated, otherwise well. Alert and oriented x 4. He is tolerating oral fluids. He has appropriate follow-up. Do not suspect ACS, CVA given exam and history.         Renne Crigler, PA-C 11/05/12  2027 

## 2012-11-05 NOTE — ED Notes (Signed)
Per ems pt has not felt well and only been drinking fluids.  Pt cbg 249.  Pt took some expired Lyrica and recently started Cymbalta and questioned some bad food from cymbalta.  No nausea or vomiting, just reports weakness

## 2012-11-06 NOTE — ED Provider Notes (Signed)
Medical screening examination/treatment/procedure(s) were performed by non-physician practitioner and as supervising physician I was immediately available for consultation/collaboration.   Gavin Pound. Linde Wilensky, MD 11/06/12 1059

## 2012-11-07 ENCOUNTER — Encounter (HOSPITAL_COMMUNITY): Payer: Self-pay | Admitting: Emergency Medicine

## 2012-11-07 ENCOUNTER — Emergency Department (HOSPITAL_COMMUNITY): Payer: Medicaid Other

## 2012-11-07 ENCOUNTER — Inpatient Hospital Stay (HOSPITAL_COMMUNITY): Payer: Medicaid Other

## 2012-11-07 ENCOUNTER — Inpatient Hospital Stay (HOSPITAL_COMMUNITY)
Admission: EM | Admit: 2012-11-07 | Discharge: 2012-11-09 | DRG: 074 | Disposition: A | Discharge status: Home / Self Care | Payer: Medicaid Other | Attending: Internal Medicine | Admitting: Internal Medicine

## 2012-11-07 DIAGNOSIS — R112 Nausea with vomiting, unspecified: Secondary | ICD-10-CM

## 2012-11-07 DIAGNOSIS — E1142 Type 2 diabetes mellitus with diabetic polyneuropathy: Secondary | ICD-10-CM | POA: Diagnosis present

## 2012-11-07 DIAGNOSIS — Z833 Family history of diabetes mellitus: Secondary | ICD-10-CM

## 2012-11-07 DIAGNOSIS — E662 Morbid (severe) obesity with alveolar hypoventilation: Secondary | ICD-10-CM | POA: Diagnosis present

## 2012-11-07 DIAGNOSIS — E1369 Other specified diabetes mellitus with other specified complication: Secondary | ICD-10-CM

## 2012-11-07 DIAGNOSIS — E1149 Type 2 diabetes mellitus with other diabetic neurological complication: Principal | ICD-10-CM | POA: Diagnosis present

## 2012-11-07 DIAGNOSIS — R739 Hyperglycemia, unspecified: Secondary | ICD-10-CM

## 2012-11-07 DIAGNOSIS — R1110 Vomiting, unspecified: Secondary | ICD-10-CM

## 2012-11-07 DIAGNOSIS — E872 Acidosis, unspecified: Secondary | ICD-10-CM | POA: Diagnosis present

## 2012-11-07 DIAGNOSIS — F3289 Other specified depressive episodes: Secondary | ICD-10-CM | POA: Diagnosis present

## 2012-11-07 DIAGNOSIS — R197 Diarrhea, unspecified: Secondary | ICD-10-CM

## 2012-11-07 DIAGNOSIS — Z79899 Other long term (current) drug therapy: Secondary | ICD-10-CM

## 2012-11-07 DIAGNOSIS — Z794 Long term (current) use of insulin: Secondary | ICD-10-CM

## 2012-11-07 DIAGNOSIS — Z6841 Body Mass Index (BMI) 40.0 and over, adult: Secondary | ICD-10-CM

## 2012-11-07 DIAGNOSIS — F329 Major depressive disorder, single episode, unspecified: Secondary | ICD-10-CM | POA: Diagnosis present

## 2012-11-07 DIAGNOSIS — E860 Dehydration: Secondary | ICD-10-CM | POA: Diagnosis present

## 2012-11-07 DIAGNOSIS — Z8249 Family history of ischemic heart disease and other diseases of the circulatory system: Secondary | ICD-10-CM

## 2012-11-07 DIAGNOSIS — D72829 Elevated white blood cell count, unspecified: Secondary | ICD-10-CM | POA: Diagnosis present

## 2012-11-07 DIAGNOSIS — D573 Sickle-cell trait: Secondary | ICD-10-CM | POA: Diagnosis present

## 2012-11-07 DIAGNOSIS — G4733 Obstructive sleep apnea (adult) (pediatric): Secondary | ICD-10-CM | POA: Diagnosis present

## 2012-11-07 DIAGNOSIS — E0865 Diabetes mellitus due to underlying condition with hyperglycemia: Secondary | ICD-10-CM

## 2012-11-07 DIAGNOSIS — Z9989 Dependence on other enabling machines and devices: Secondary | ICD-10-CM | POA: Diagnosis present

## 2012-11-07 DIAGNOSIS — E669 Obesity, unspecified: Secondary | ICD-10-CM

## 2012-11-07 DIAGNOSIS — I10 Essential (primary) hypertension: Secondary | ICD-10-CM | POA: Diagnosis present

## 2012-11-07 DIAGNOSIS — R109 Unspecified abdominal pain: Secondary | ICD-10-CM

## 2012-11-07 DIAGNOSIS — F411 Generalized anxiety disorder: Secondary | ICD-10-CM | POA: Diagnosis present

## 2012-11-07 DIAGNOSIS — K3184 Gastroparesis: Secondary | ICD-10-CM | POA: Diagnosis present

## 2012-11-07 DIAGNOSIS — A088 Other specified intestinal infections: Secondary | ICD-10-CM | POA: Diagnosis present

## 2012-11-07 HISTORY — DX: Sickle-cell trait: D57.3

## 2012-11-07 LAB — CBC
HCT: 41.8 % (ref 39.0–52.0)
Hemoglobin: 14.7 g/dL (ref 13.0–17.0)
Hemoglobin: 14.4 g/dL (ref 13.0–17.0)
MCHC: 33.7 g/dL (ref 30.0–36.0)
MCV: 78.4 fL (ref 78.0–100.0)
RBC: 5.33 MIL/uL (ref 4.22–5.81)
RBC: 5.37 MIL/uL (ref 4.22–5.81)
WBC: 12.9 10*3/uL — ABNORMAL HIGH (ref 4.0–10.5)

## 2012-11-07 LAB — COMPREHENSIVE METABOLIC PANEL
AST: 17 U/L (ref 0–37)
Albumin: 3.4 g/dL — ABNORMAL LOW (ref 3.5–5.2)
Chloride: 94 mEq/L — ABNORMAL LOW (ref 96–112)
Creatinine, Ser: 1.2 mg/dL (ref 0.50–1.35)
Total Bilirubin: 0.3 mg/dL (ref 0.3–1.2)
Total Protein: 8 g/dL (ref 6.0–8.3)

## 2012-11-07 LAB — POCT I-STAT 3, ART BLOOD GAS (G3+)
Bicarbonate: 25.7 mEq/L — ABNORMAL HIGH (ref 20.0–24.0)
O2 Saturation: 91 %
Patient temperature: 98.6
pCO2 arterial: 45.1 mmHg — ABNORMAL HIGH (ref 35.0–45.0)
pO2, Arterial: 65 mmHg — ABNORMAL LOW (ref 80.0–100.0)

## 2012-11-07 LAB — URINALYSIS, ROUTINE W REFLEX MICROSCOPIC
Glucose, UA: >1000 mg/dL — AB
Hgb urine dipstick: NEGATIVE
Leukocytes, UA: NEGATIVE
Protein, ur: 30 mg/dL — AB
Specific Gravity, Urine: 1.017 (ref 1.005–1.030)
pH: 6.5 (ref 5.0–8.0)

## 2012-11-07 LAB — URINE MICROSCOPIC-ADD ON

## 2012-11-07 LAB — PRO B NATRIURETIC PEPTIDE: Pro B Natriuretic peptide (BNP): 50.3 pg/mL (ref 0–125)

## 2012-11-07 LAB — LACTIC ACID, PLASMA: Lactic Acid, Venous: 1.3 mmol/L (ref 0.5–2.2)

## 2012-11-07 LAB — GLUCOSE, CAPILLARY: Glucose-Capillary: 275 mg/dL — ABNORMAL HIGH (ref 70–99)

## 2012-11-07 LAB — CREATININE, SERUM
Creatinine, Ser: 1.07 mg/dL (ref 0.50–1.35)
GFR calc non Af Amer: 83 mL/min — ABNORMAL LOW (ref >90)

## 2012-11-07 LAB — CG4 I-STAT (LACTIC ACID): Lactic Acid, Venous: 5.77 mmol/L — ABNORMAL HIGH (ref 0.5–2.2)

## 2012-11-07 MED ORDER — ONDANSETRON HCL 4 MG/2ML IJ SOLN
4.0000 mg | Freq: Once | INTRAMUSCULAR | Status: AC
  Administered 2012-11-07: 4 mg via INTRAVENOUS
  Filled 2012-11-07: qty 2

## 2012-11-07 MED ORDER — INSULIN ASPART 100 UNIT/ML ~~LOC~~ SOLN
0.0000 [IU] | Freq: Three times a day (TID) | SUBCUTANEOUS | Status: DC
  Administered 2012-11-07: 8 [IU] via SUBCUTANEOUS
  Administered 2012-11-08: 3 [IU] via SUBCUTANEOUS
  Administered 2012-11-08 – 2012-11-09 (×3): 2 [IU] via SUBCUTANEOUS

## 2012-11-07 MED ORDER — HYDROMORPHONE HCL PF 1 MG/ML IJ SOLN
1.0000 mg | Freq: Once | INTRAMUSCULAR | Status: AC
  Administered 2012-11-07: 1 mg via INTRAVENOUS
  Filled 2012-11-07: qty 1

## 2012-11-07 MED ORDER — AMLODIPINE-VALSARTAN-HCTZ 10-320-25 MG PO TABS: 1.0000 | ORAL_TABLET | Freq: Every day | ORAL | Status: DC

## 2012-11-07 MED ORDER — INSULIN ASPART 100 UNIT/ML ~~LOC~~ SOLN
0.0000 [IU] | Freq: Every day | SUBCUTANEOUS | Status: DC
  Administered 2012-11-07: 22:00:00 via SUBCUTANEOUS

## 2012-11-07 MED ORDER — ACETAMINOPHEN 650 MG RE SUPP: 650.0000 mg | Freq: Four times a day (QID) | RECTAL | Status: DC | PRN

## 2012-11-07 MED ORDER — HYDROMORPHONE HCL PF 1 MG/ML IJ SOLN
1.0000 mg | INTRAMUSCULAR | Status: AC | PRN
  Filled 2012-11-07: qty 1

## 2012-11-07 MED ORDER — AMLODIPINE BESYLATE 10 MG PO TABS
10.0000 mg | ORAL_TABLET | Freq: Every day | ORAL | Status: DC
  Administered 2012-11-08 – 2012-11-09 (×2): 10 mg via ORAL
  Filled 2012-11-07 (×2): qty 1

## 2012-11-07 MED ORDER — IRBESARTAN 300 MG PO TABS
300.0000 mg | ORAL_TABLET | Freq: Every day | ORAL | Status: DC
  Administered 2012-11-08 – 2012-11-09 (×2): 300 mg via ORAL
  Filled 2012-11-07 (×2): qty 1

## 2012-11-07 MED ORDER — INSULIN GLARGINE 100 UNIT/ML ~~LOC~~ SOLN
80.0000 [IU] | Freq: Every day | SUBCUTANEOUS | Status: DC
  Administered 2012-11-07 – 2012-11-08 (×2): 80 [IU] via SUBCUTANEOUS
  Filled 2012-11-07 (×3): qty 0.8

## 2012-11-07 MED ORDER — SODIUM CHLORIDE 0.9 % IV BOLUS (SEPSIS)
1000.0000 mL | Freq: Once | INTRAVENOUS | Status: AC
  Administered 2012-11-07: 1000 mL via INTRAVENOUS

## 2012-11-07 MED ORDER — SODIUM CHLORIDE 0.9 % IV SOLN
INTRAVENOUS | Status: AC
  Administered 2012-11-07: 16:00:00 via INTRAVENOUS

## 2012-11-07 MED ORDER — IOHEXOL 300 MG/ML  SOLN
100.0000 mL | Freq: Once | INTRAMUSCULAR | Status: AC | PRN
  Administered 2012-11-07: 100 mL via INTRAVENOUS

## 2012-11-07 MED ORDER — PANTOPRAZOLE SODIUM 40 MG IV SOLR
40.0000 mg | Freq: Once | INTRAVENOUS | Status: AC
  Administered 2012-11-07: 40 mg via INTRAVENOUS
  Filled 2012-11-07: qty 40

## 2012-11-07 MED ORDER — SODIUM CHLORIDE 0.9 % IV SOLN
INTRAVENOUS | Status: DC
  Administered 2012-11-07 (×2): via INTRAVENOUS
  Administered 2012-11-08: 1000 mL via INTRAVENOUS

## 2012-11-07 MED ORDER — HYDROCHLOROTHIAZIDE 25 MG PO TABS
25.0000 mg | ORAL_TABLET | Freq: Every day | ORAL | Status: DC
  Administered 2012-11-08 – 2012-11-09 (×2): 25 mg via ORAL
  Filled 2012-11-07 (×2): qty 1

## 2012-11-07 MED ORDER — ACETAMINOPHEN 325 MG PO TABS: 650.0000 mg | ORAL_TABLET | Freq: Four times a day (QID) | ORAL | Status: DC | PRN

## 2012-11-07 MED ORDER — PROMETHAZINE HCL 25 MG/ML IJ SOLN
25.0000 mg | INTRAMUSCULAR | Status: AC
  Administered 2012-11-07: 25 mg via INTRAVENOUS
  Filled 2012-11-07: qty 1

## 2012-11-07 MED ORDER — IOHEXOL 300 MG/ML  SOLN
25.0000 mL | INTRAMUSCULAR | Status: AC
  Administered 2012-11-07 (×2): 25 mL via ORAL

## 2012-11-07 MED ORDER — ONDANSETRON HCL 4 MG/2ML IJ SOLN
4.0000 mg | Freq: Three times a day (TID) | INTRAMUSCULAR | Status: AC | PRN
  Administered 2012-11-07: 4 mg via INTRAVENOUS
  Filled 2012-11-07: qty 2

## 2012-11-07 MED ORDER — PROMETHAZINE HCL 25 MG PO TABS: 12.5000 mg | ORAL_TABLET | Freq: Four times a day (QID) | ORAL | Status: DC | PRN

## 2012-11-07 MED ORDER — ENOXAPARIN SODIUM 40 MG/0.4ML ~~LOC~~ SOLN
40.0000 mg | SUBCUTANEOUS | Status: DC
  Administered 2012-11-07: 40 mg via SUBCUTANEOUS
  Filled 2012-11-07 (×2): qty 0.4

## 2012-11-07 MED ORDER — OXYCODONE HCL 5 MG PO TABS
5.0000 mg | ORAL_TABLET | ORAL | Status: DC | PRN
  Filled 2012-11-07: qty 1

## 2012-11-07 MED ORDER — SODIUM CHLORIDE 0.9 % IJ SOLN
3.0000 mL | Freq: Two times a day (BID) | INTRAMUSCULAR | Status: DC
  Administered 2012-11-08: 3 mL via INTRAVENOUS

## 2012-11-07 MED ORDER — POTASSIUM CHLORIDE CRYS ER 20 MEQ PO TBCR
40.0000 meq | EXTENDED_RELEASE_TABLET | Freq: Two times a day (BID) | ORAL | Status: DC
  Administered 2012-11-07 – 2012-11-09 (×5): 40 meq via ORAL
  Filled 2012-11-07 (×7): qty 2

## 2012-11-07 NOTE — H&P (Signed)
[facility] History and Physical  GENESIS PAGET QMV:[id number] DOB: 10/05/1970 DOA: 11/07/2012  Referring physician: Dr Durene Cal PCP: Geraldo Pitter, MD  Specialists: none  Chief Complaint: nausea,vomiting abdominal pain  HPI: Kenneth Fox is a 42 y.o. male with long h/o Hypertension, DM, morbid obesiity, came in for worsening nausea, vomiting, abdominal pain, and diarrhea since one week. On arrival to ED, He was found to be afebrile, with mild leukocytosis, and elevated lactic acid. A CT ABD was performed showed fatty liver without any acute findings. He was admitted to medical service for management of dehydration and lactic acidosis.  Review of Systems: The patient denies anorexia, fever, weight loss,, vision loss, decreased hearing, hoarseness, chest pain, syncope,  balance deficits, hemoptysis,  melena, hematochezia, severe indigestion/heartburn, hematuria, incontinence, genital sores, muscle weakness, suspicious skin lesions, transient blindness, difficulty walking, depression, unusual weight change, abnormal bleeding, enlarged lymph nodes, angioedema, and breast masses.    Past Medical History  Diagnosis Date  . Diabetes mellitus   . Hypertension   . Asthma     childhood  . Neuropathy   . Anxiety   . Depression   . Sickle cell trait   . Sleep apnea    History reviewed. No pertinent past surgical history. Social History:  reports that he has never smoked. He does not have any smokeless tobacco history on file. He reports that he does not drink alcohol or use illicit drugs.  where does patient live--home,  No Known Allergies  Family History  Problem Relation Age of Onset  . Diabetes Father   . Neuropathy Father   . Hypertension Father   . Sickle cell anemia Mother     Prior to Admission medications   Medication Sig Start Date End Date Taking? Authorizing Provider  Amlodipine-Valsartan-HCTZ (EXFORGE HCT) 10-320-25 MG TABS Take 1 tablet by mouth daily.     Yes  Historical Provider, MD  DULoxetine (CYMBALTA) 60 MG capsule Take 60 mg by mouth 2 (two) times daily.   Yes Historical Provider, MD  indapamide (LOZOL) 1.25 MG tablet Take 1.25 mg by mouth daily.   Yes Historical Provider, MD  insulin aspart (NOVOLOG) 100 UNIT/ML injection Inject 10-20 Units into the skin 3 (three) times daily with meals. 10-20 units per sliding scale   Yes Historical Provider, MD  insulin glargine (LANTUS) 100 UNIT/ML injection Inject 80 Units into the skin at bedtime.     Yes Historical Provider, MD  meloxicam (MOBIC) 7.5 MG tablet Take 7.5 mg by mouth 2 (two) times daily.   Yes Historical Provider, MD  metFORMIN (GLUCOPHAGE) 1000 MG tablet Take 1,000 mg by mouth 2 (two) times daily with a meal.   Yes Historical Provider, MD  ondansetron (ZOFRAN ODT) 4 MG disintegrating tablet Take 1 tablet (4 mg total) by mouth every 8 (eight) hours as needed for nausea. 11/05/12  Yes Renne Crigler, PA-C  Oxycodone HCl 10 MG TABS Take 10 mg by mouth every 6 (six) hours as needed (for pain).   Yes Historical Provider, MD  tiZANidine (ZANAFLEX) 4 MG tablet Take 8 mg by mouth at bedtime. For muscle spasms   Yes Historical Provider, MD   Physical Exam: Filed Vitals:   11/07/12 1530 11/07/12 1632 11/07/12 1700 11/07/12 1734  BP: 120/85 131/97 128/91 152/92  Pulse: 102 94 99 101  Temp:    97.2 F (36.2 C)  TempSrc:    Oral  Resp:    18  Height:    5\' 9"  (1.753 m)  Weight:    140.842 kg (310 lb 8 oz)  SpO2: 100% 93% 95% 100%    Constitutional: Vital signs reviewed.  Patient is morbidly obese in no acute distress and cooperative with exam. Alert and oriented x3.  Head: Normocephalic and atraumatic Nose: No erythema or drainage noted.   Mouth: no erythema or exudates, MMM Eyes: PERRL, EOMI, conjunctivae normal, No scleral icterus.  Neck: Supple, Trachea midline normal ROM, No JVD, mass, thyromegaly, or carotid bruit present.  Cardiovascular: RRR, S1 normal, S2 normal, no MRG, pulses symmetric  and intact bilaterally Pulmonary/Chest: normal respiratory effort, CTAB, no wheezes, rales, or rhonchi Abdominal: Soft. Mild generalized tenderness, non-distended, bowel sounds are normal, Musculoskeletal: No joint deformities, erythema, or stiffness, ROM full , trace edema  Neurological: A&O x3, Strength is normal and symmetric bilaterally, cranial nerve II-XII are grossly intact, no focal motor deficit, sensory intact to light touch bilaterally.  Skin: Warm, dry and intact. No rash, cyanosis, or clubbing.  .     Labs on Admission:  Basic Metabolic Panel:  Recent Labs Lab 11/05/12 1703 11/07/12 0907 11/07/12 1825  NA 133* 135  --   K 3.1* 3.3*  --   CL 92* 94*  --   CO2 29 23  --   GLUCOSE 245* 270*  --   BUN 20 19  --   CREATININE 1.35 1.20 1.07  CALCIUM 9.7 9.6  --    Liver Function Tests:  Recent Labs Lab 11/05/12 1703 11/07/12 0907  AST 14 17  ALT 16 16  ALKPHOS 116 104  BILITOT 0.4 0.3  PROT 8.4* 8.0  ALBUMIN 3.7 3.4*    Recent Labs Lab 11/05/12 1703 11/07/12 0902  LIPASE 35 49   No results found for this basename: AMMONIA,  in the last 168 hours CBC:  Recent Labs Lab 11/05/12 1703 11/07/12 0902 11/07/12 1825  WBC 10.0 12.9* 11.5*  NEUTROABS 6.8  --   --   HGB 15.4 14.7 14.4  HCT 43.6 41.8 42.7  MCV 78.3 78.4 79.5  PLT 240 248 244   Cardiac Enzymes: No results found for this basename: CKTOTAL, CKMB, CKMBINDEX, TROPONINI,  in the last 168 hours  BNP (last 3 results)  Recent Labs  11/07/12 1826  PROBNP 50.3   CBG:  Recent Labs Lab 11/05/12 1701 11/05/12 2120 11/07/12 0807 11/07/12 1557 11/07/12 1808  GLUCAP 241* 192* 275* 284* 259*    Radiological Exams on Admission: Ct Abdomen Pelvis W Contrast  11/07/2012   *RADIOLOGY REPORT*  Clinical Data: Nausea, vomiting and diarrhea.  CT ABDOMEN AND PELVIS WITH CONTRAST  Technique:  Multidetector CT imaging of the abdomen and pelvis was performed following the standard protocol during  bolus administration of intravenous contrast.  Contrast: OMNIPAQUE IOHEXOL 300 MG/ML  SOLN  Comparison: Chest and two views abdomen earlier this same date.  CT abdomen and pelvis 05/20/2011.  Findings: There is bibasilar airspace disease could be secondary to atelectasis or pneumonia.  No pleural or pericardial effusion is identified.  The liver is diffusely low attenuating consistent with fatty infiltration.  No focal liver lesion is identified.  The adrenal glands, spleen, pancreas and left kidney appear normal.  Small low attenuating lesion in the upper pole of the right kidney compatible with a cyst is unchanged.  The patient has a small fat containing umbilical hernia.  The stomach, small and large bowel and appendix appear normal.  There is no lymphadenopathy or fluid.  No focal bony abnormality is seen with  postoperative change of a fixation of a posterior right acetabular fracture noted.  IMPRESSION:  1.  No acute finding in the abdomen or pelvis. 2.  Bibasilar airspace disease is could be due to atelectasis or pneumonia. 3.  Fatty infiltration of the liver. 4.  Small fat containing umbilical hernia.   Original Report Authenticated By: Holley Dexter, M.D.   Dg Abd Acute W/chest  11/07/2012   *RADIOLOGY REPORT*  Clinical Data: Abdominal pain.  Nausea and vomiting.  ACUTE ABDOMEN SERIES (ABDOMEN 2 VIEW & CHEST 1 VIEW)  Comparison: CT abdomen pelvis 05/20/2011  Findings: The heart size is exaggerated by low lung volumes.  The lungs are clear.  Supine and decubitus views of the abdomen demonstrate a single dilated loop of bowel in the left lower quadrant.  The bowel gas pattern is otherwise unremarkable.  There are no fluid levels.  There is no evidence for obstruction. Postsurgical changes are noted in the right pelvis.  IMPRESSION:  1.  Low lung volumes without evidence for acute cardiopulmonary disease. 2.  Focal dilated loop of bowel in the left lower quadrant.  This could represent focal colitis  or ileus. 3.  No evidence for obstruction or free air.   Original Report Authenticated By: Marin Roberts, M.D.    EKG:not done  Assessment/Plan Active Problems: 1. Nausea, vomiting, abdominal pain: possibly secondary to viral gastroenteritis vs gastroparesis vs PUD.  - admit, start him on clear liquid diet, and advance as tolerated.  - anti emetics.  - IV fluids to be given.  - pain control  - CT not sig for any acute findings.  -npo after midnight for gastric emptying study in am. ,if positive please start reglan. - with his history of pUD, will give him a dose of IV protonix.   2. Sleep apnea, reports waking up in his sleep, snoring and hypoxia: with a component of obesity hypoventilation syndrome - abg shows hypoxia. - nasal canula oxygen during day time and CPAP at night.  - sleep studies as outpatient.   3. Leukocytosis: possibly from dehydration. He is afebrile . CT abd is non specific. Monitor off antibiotics.   4. Lactic acidosis : probably a combination of dehydration, from persistent vomiting and diarrhea and being on metformin. Resolved after IV hydration.   5.Diabetes mellitus: appears to be uncontrolled.  CBG (last 3)   Recent Labs  11/07/12 0807 11/07/12 1557 11/07/12 1808  GLUCAP 275* 284* 259*    Resume home lantus, SSI. HGBA1C ordered.   6. Hypertension: resume home medications.   7. Slight tachycardia and hypoxia on abg: will get d dimer if elevated please obtain CT angio of the chest to evaluate for PE.   8. Diarrhea: loose BM, Occasionaly, will evaluate for C DIFF infection . No antibiotic use recently.   DVT PROPHYLAXIS.   Code Status: FULL CODE Family Communication: family at bedside Disposition Plan: pending.   Time spent: 85 min  Denis Koppel [facility] Pager [phone]  If 7PM-7AM, please contact night-coverage [email or web address] Password [facility] 11/07/2012, 7:09 PM

## 2012-11-07 NOTE — ED Notes (Signed)
Checked patient cbg it was 66 notified RN [place]

## 2012-11-07 NOTE — ED Notes (Signed)
Checked patient blood sugar it was 284 notified RN [place] of blood sugar

## 2012-11-07 NOTE — Progress Notes (Signed)
Kenneth Fox [id number] Code Status: full   Admission Data: 11/07/2012 6:19 PM Attending Provider:  akula WUJ:WJXBJ,YNWGN J, MD Consults/ Treatment Team:    Kenneth Fox is a 42 y.o. male patient admitted from ED awake, alert - oriented  X 3 - no acute distress noted.  VSS - Blood pressure 152/92, pulse 101, temperature 97.2 F (36.2 C), temperature source Oral, resp. rate 18, height 5\' 9"  (1.753 m), weight 140.842 kg (310 lb 8 oz), SpO2 100.00%.  no c/o shortness of breath, no c/o chest pain. Cardiac tele # [phone], in place, cardiac monitor yields:normal sinus rhythm. O2:   @ 3 l/min per minute. IV Fluids:  IV in place, occlusive dsg intact without redness, IV cath antecubital left, condition patent and no redness normal saline.  Allergies:  No Known Allergies   Past Medical History  Diagnosis Date  . Diabetes mellitus   . Hypertension   . Asthma     childhood  . Neuropathy   . Anxiety   . Depression   . Sickle cell trait   . Sleep apnea    Medications Prior to Admission  Medication Sig Dispense Refill  . Amlodipine-Valsartan-HCTZ (EXFORGE HCT) 10-320-25 MG TABS Take 1 tablet by mouth daily.        . DULoxetine (CYMBALTA) 60 MG capsule Take 60 mg by mouth 2 (two) times daily.      . indapamide (LOZOL) 1.25 MG tablet Take 1.25 mg by mouth daily.      . insulin aspart (NOVOLOG) 100 UNIT/ML injection Inject 10-20 Units into the skin 3 (three) times daily with meals. 10-20 units per sliding scale      . insulin glargine (LANTUS) 100 UNIT/ML injection Inject 80 Units into the skin at bedtime.        . meloxicam (MOBIC) 7.5 MG tablet Take 7.5 mg by mouth 2 (two) times daily.      . metFORMIN (GLUCOPHAGE) 1000 MG tablet Take 1,000 mg by mouth 2 (two) times daily with a meal.      . ondansetron (ZOFRAN ODT) 4 MG disintegrating tablet Take 1 tablet (4 mg total) by mouth every 8 (eight) hours as needed for nausea.  12 tablet  0  . Oxycodone HCl 10 MG TABS Take 10 mg by mouth every 6  (six) hours as needed (for pain).      [name] tiZANidine (ZANAFLEX) 4 MG tablet Take 8 mg by mouth at bedtime. For muscle spasms       History:  obtained from the patient. Tobacco/alcohol: denied social drinker  Orientation to room, and floor completed with information packet given to patient/family.  Patient declined safety video at this time.  Admission INP armband ID verified with patient/family, and in place.   SR up x 2, fall assessment complete, with patient and family able to verbalize understanding of risk associated with falls, and verbalized understanding to call nsg before up out of bed.  Call light within reach, patient able to voice, and demonstrate understanding.  Skin, clean-dry- intact without evidence of bruising, or skin tears.   No evidence of skin break down noted on exam.     Will cont to eval and treat per MD orders.  Orvan Seen, RN 11/07/2012 6:19 PM

## 2012-11-07 NOTE — ED Provider Notes (Signed)
I saw and evaluated the patient, reviewed the resident's note and I agree with the findings and plan.   .Face to face Exam:  General:  Awake HEENT:  Atraumatic Resp:  Normal effort Abd:  Nondistended Neuro:No focal weakness Lymph: No adenopathy  Nelia Shi, MD 11/07/12 1452

## 2012-11-07 NOTE — ED Notes (Signed)
N/v/d x 1 week was seen yesterday given rx but did not could not get them filled

## 2012-11-07 NOTE — Progress Notes (Signed)
Placed patient on CPAP at 10 cm H2O, humidity, and 2 liters of O2 bleed in.  Patient tolerating CPAP well.

## 2012-11-07 NOTE — ED Provider Notes (Signed)
History     CSN: [id number]  Arrival date & time 11/07/12  9562   First MD Initiated Contact with Patient 11/07/12 0805    Chief Complaint  Patient presents with  . Emesis   Patient is a 42 y.o. male presenting with vomiting. The history is provided by the patient.  Emesis Associated symptoms: abdominal pain, chills and diarrhea    50 M with a history of diabetes and neuropathy (states from feet all the way up to neck). Was seen in ED on 5/31.   Cold sweats, watery diarrhea (3-4x per day), vomiting (nonbilious, nonbloody), abdominal pain. Symptoms started 1 week ago and in bed for 5 days. Thinks it is related to when he went to [employer] about a week ago. Also Started Cymbalta that next day as well which he states he is taking for neuropathy. Able to keep fluids down. Main thing bothering him is solid foods (pb+j last night, flavored yellow rice, small amount of steak yesterday). CBGs at home have been between 125-350. Insulin taking 100 lantus at night and novolog with meals between 5-10 units per meal as well as metformin. Abdominal pain in epigastric region. Described as burning pain, warm feeling. Minimal radiation but some throughout abdomen. Rated as 9/10. Nausea and vomiting more bothersome than the pain. Stopped taking GERD medication about a year ago. Had similar symptoms about a year ago. States has not taken any of his medicine except insulin as couldn't keep things down. NSAID exposure includes mobic.   Seen in ED on 11/05/12, but Couldn't get to pharmacy to pick up zofran or nystatin after being seen in ED.  Better/worse-symptoms worse with recent stressors (financial, car breaking down, wife working a lot, Engineer, site asking for $  ROS-"jumpy nerves" in hands/feet which is normal for him with his neuropathy Denies fever, headache, blurry vision, rashes, urinary symptoms, constipation, diarrhea (3-4x per day), chest pain, shortness of breath. No hematemesis. No cough/cold  symptoms.   Past Medical History  Diagnosis Date  . Diabetes mellitus   . Hypertension   . Asthma     childhood  . Neuropathy   . Anxiety   . Depression   . Sickle cell trait    Surgical history-none  Family History  Problem Relation Age of Onset  . Diabetes Father   . Neuropathy Father   . Hypertension Father   . Sickle cell anemia Mother     History  Substance Use Topics  . Smoking status: Never Smoker   . Smokeless tobacco: Not on file  . Alcohol Use: No     Review of Systems  Constitutional: Positive for chills, appetite change and fatigue.  HENT: Negative for congestion, rhinorrhea, sneezing, neck pain and neck stiffness.   Eyes: Negative for photophobia and pain.  Respiratory: Negative for cough, choking, chest tightness, shortness of breath and wheezing.   Cardiovascular: Negative for chest pain, palpitations and leg swelling.  Gastrointestinal: Positive for nausea, vomiting, abdominal pain and diarrhea. Negative for constipation, blood in stool, abdominal distention and anal bleeding.  Endocrine: Positive for cold intolerance. Negative for polydipsia and polyuria.  Genitourinary: Negative for dysuria, flank pain and difficulty urinating.    Allergies  Review of patient's allergies indicates no known allergies.  Home Medications   Current Outpatient Rx  Name  Route  Sig  Dispense  Refill  . Amlodipine-Valsartan-HCTZ (EXFORGE HCT) 10-320-25 MG TABS   Oral   Take 1 tablet by mouth daily.           [name]  DULoxetine (CYMBALTA) 60 MG capsule   Oral   Take 60 mg by mouth 2 (two) times daily.         . indapamide (LOZOL) 1.25 MG tablet   Oral   Take 1.25 mg by mouth daily.         . insulin aspart (NOVOLOG) 100 UNIT/ML injection   Subcutaneous   Inject 10-20 Units into the skin 3 (three) times daily with meals. 10-20 units per sliding scale         . insulin glargine (LANTUS) 100 UNIT/ML injection   Subcutaneous   Inject 80 Units into the skin at  bedtime.           . meloxicam (MOBIC) 7.5 MG tablet   Oral   Take 7.5 mg by mouth 2 (two) times daily.         . metFORMIN (GLUCOPHAGE) 1000 MG tablet   Oral   Take 1,000 mg by mouth 2 (two) times daily with a meal.         . ondansetron (ZOFRAN ODT) 4 MG disintegrating tablet   Oral   Take 1 tablet (4 mg total) by mouth every 8 (eight) hours as needed for nausea.   12 tablet   0   . Oxycodone HCl 10 MG TABS   Oral   Take 10 mg by mouth every 6 (six) hours as needed (for pain).         [name] tiZANidine (ZANAFLEX) 4 MG tablet   Oral   Take 8 mg by mouth at bedtime. For muscle spasms           BP 142/93  Pulse 91  SpO2 94%  Physical Exam  Constitutional: He is oriented to person, place, and time. He appears well-developed and well-nourished.  Appears mildly uncomfortable. Tearful. Not diaphoretic.   HENT:  Head: Normocephalic and atraumatic.  Mouth/Throat: Oropharynx is clear and moist.  Tongue slightly dry but no obvious thrush  Eyes: EOM are normal. Pupils are equal, round, and reactive to light.  Neck: Normal range of motion. Neck supple.  Cardiovascular: Regular rhythm.  Exam reveals no gallop and no friction rub.   No murmur heard. Minimally tachycardic to 100-105  Pulmonary/Chest: Effort normal and breath sounds normal. He has no wheezes. He has no rales.  Abdominal:  Obese, no obvious distension. Tenderness to palpation in epigastric as well as LUQ and RUQ. No rebounding or guarding. No hepatosplenomegaly or other mass  Musculoskeletal: Normal range of motion. He exhibits edema (trace).  Neurological: He is oriented to person, place, and time.  Skin: Skin is warm and dry. No rash noted.  Psychiatric: He has a normal mood and affect. His behavior is normal.    ED Course  Procedures (including critical care time)  Labs Reviewed  GLUCOSE, CAPILLARY - Abnormal; Notable for the following:    Glucose-Capillary 275 (*)    All other components within  normal limits  COMPREHENSIVE METABOLIC PANEL - Abnormal; Notable for the following:    Potassium 3.3 (*)    Chloride 94 (*)    Glucose, Bld 270 (*)    Albumin 3.4 (*)    GFR calc non Af Amer 73 (*)    GFR calc Af Amer 84 (*)    All other components within normal limits  URINALYSIS, ROUTINE W REFLEX MICROSCOPIC - Abnormal; Notable for the following:    Glucose, UA >1000 (*)    Ketones, ur 15 (*)    Protein, ur 30 (*)  All other components within normal limits  CBC - Abnormal; Notable for the following:    WBC 12.9 (*)    All other components within normal limits  CG4 I-STAT (LACTIC ACID) - Abnormal; Notable for the following:    Lactic Acid, Venous 5.77 (*)    All other components within normal limits  LIPASE, BLOOD  URINE MICROSCOPIC-ADD ON   Dg Abd Acute W/chest  11/07/2012   *RADIOLOGY REPORT*  Clinical Data: Abdominal pain.  Nausea and vomiting.  ACUTE ABDOMEN SERIES (ABDOMEN 2 VIEW & CHEST 1 VIEW)  Comparison: CT abdomen pelvis 05/20/2011  Findings: The heart size is exaggerated by low lung volumes.  The lungs are clear.  Supine and decubitus views of the abdomen demonstrate a single dilated loop of bowel in the left lower quadrant.  The bowel gas pattern is otherwise unremarkable.  There are no fluid levels.  There is no evidence for obstruction. Postsurgical changes are noted in the right pelvis.  IMPRESSION:  1.  Low lung volumes without evidence for acute cardiopulmonary disease. 2.  Focal dilated loop of bowel in the left lower quadrant.  This could represent focal colitis or ileus. 3.  No evidence for obstruction or free air.   Original Report Authenticated By: Marin Roberts, M.D.    No diagnosis found.  *8:50 Seen and examined-CBG 275 but will order cmet, UA, CBC, lipase as well as abdominal series. Patient improved yesterday with hydration so will give 1L at this time. Cymbalta with side effects of diarrhea 8% patients, vomiting 5%, gastritis 3% of patients so will  stop medication when patient discharged as may be confounding factor and also since symptoms started on day patient first took medication. No signs of thrush so will not reorder nystatin (tongue just looks dry). Suspicion for gastric ulcer high on differential-history of similar that improved with PPI and patient on chronic NSAIDS.   *10:30 Abd series reviewed and shows focal dilated loop of bowel in LLQ. Will obtain CT scan to further characterize. Possible ileus or colitis.  Also, CMP reviewed with mild hypokalemia. Technically patient does have anion gap of 18. Will consider repeat after fluid bolus. Have ordered an additonal bolus at this time.  CBC also shows mild elevation in WBC to 12.9 from 10 yesterday.  POC lactic acid also ordered.   1:30 PM-spoke with patient and states pain well controlled. He has continued to vomit (reports last time when he was in CT scanner) despite zofran and phenergan. Wife at bedside-reports a1c has been anywhere from 44-12. She also gives new information that he has intermittent nausea/vomitting/diarrhea issues throughout the year (different from report from patient that last time this occurred was a year ago.  MDM  57 M with poorly controlled diabetes presenting with nausea/vomiting/diarrhea which has been refractory to zofran/phergan and also with anion gap of 18 and lactic acid >5.  Have spoken with Dr. Blake Divine [facility] who accepts patient for observation overnight. I have placed an order for repeat lactic acid since patient was ordered to have 2 units of fluids as well as repeat CBG.  Possible etiologies include gastric ulcer, diabetic gastroparesis, cymbalta side effects (though with history of more chronic nature less likely to be), decreased gut mobility due to chronic narcotics, viral GI illness (though I believe this is less likely). Would consider initiating PPI.    Shelva Majestic, MD 11/07/12 [phone]

## 2012-11-08 ENCOUNTER — Inpatient Hospital Stay (HOSPITAL_COMMUNITY): Payer: Medicaid Other

## 2012-11-08 DIAGNOSIS — R7309 Other abnormal glucose: Secondary | ICD-10-CM

## 2012-11-08 LAB — CBC
Hemoglobin: 13.2 g/dL (ref 13.0–17.0)
MCHC: 33.4 g/dL (ref 30.0–36.0)
Platelets: 242 10*3/uL (ref 150–400)
RDW: 14.7 % (ref 11.5–15.5)

## 2012-11-08 LAB — BASIC METABOLIC PANEL
BUN: 13 mg/dL (ref 6–23)
Calcium: 9.1 mg/dL (ref 8.4–10.5)
Creatinine, Ser: 1.11 mg/dL (ref 0.50–1.35)
GFR calc Af Amer: >90 mL/min (ref >90)
GFR calc non Af Amer: 80 mL/min — ABNORMAL LOW (ref >90)
Potassium: 3.5 mEq/L (ref 3.5–5.1)

## 2012-11-08 LAB — GLUCOSE, CAPILLARY
Glucose-Capillary: 108 mg/dL — ABNORMAL HIGH (ref 70–99)
Glucose-Capillary: 155 mg/dL — ABNORMAL HIGH (ref 70–99)

## 2012-11-08 LAB — TSH: TSH: 0.236 u[IU]/mL — ABNORMAL LOW (ref 0.350–4.500)

## 2012-11-08 MED ORDER — ENOXAPARIN SODIUM 80 MG/0.8ML ~~LOC~~ SOLN
70.0000 mg | SUBCUTANEOUS | Status: DC
  Administered 2012-11-08: 70 mg via SUBCUTANEOUS
  Filled 2012-11-08 (×2): qty 0.8

## 2012-11-08 MED ORDER — PANTOPRAZOLE SODIUM 40 MG IV SOLR
40.0000 mg | INTRAVENOUS | Status: DC
  Administered 2012-11-08: 40 mg via INTRAVENOUS
  Filled 2012-11-08 (×2): qty 40

## 2012-11-08 MED ORDER — OXYCODONE HCL 5 MG PO TABS
5.0000 mg | ORAL_TABLET | ORAL | Status: AC | PRN
  Administered 2012-11-08 – 2012-11-09 (×2): 5 mg via ORAL
  Filled 2012-11-08 (×2): qty 1

## 2012-11-08 MED ORDER — TECHNETIUM TC 99M SULFUR COLLOID
2.0000 | Freq: Once | INTRAVENOUS | Status: AC | PRN
  Administered 2012-11-08: 2 via INTRAVENOUS

## 2012-11-08 MED ORDER — METOCLOPRAMIDE HCL 5 MG/ML IJ SOLN
10.0000 mg | Freq: Three times a day (TID) | INTRAMUSCULAR | Status: DC
  Administered 2012-11-08 (×2): 10 mg via INTRAVENOUS
  Filled 2012-11-08 (×7): qty 2

## 2012-11-08 MED ORDER — HYDROMORPHONE HCL PF 1 MG/ML IJ SOLN
0.5000 mg | Freq: Once | INTRAMUSCULAR | Status: AC
  Administered 2012-11-08: 0.5 mg via INTRAVENOUS
  Filled 2012-11-08: qty 1

## 2012-11-08 NOTE — Care Management Note (Signed)
    Page 1 of 1   11/09/2012     12:53:16 PM   CARE MANAGEMENT NOTE 11/09/2012  Patient:  Kenneth Fox, Kenneth Fox   Account Number:  [id number]  Date Initiated:  11/08/2012  Documentation initiated by:  Letha Cape  Subjective/Objective Assessment:   dx DM (uncontrolled)  admit- lives with spouse.  pta indep.     Action/Plan:   Anticipated DC Date:  11/10/2012   Anticipated DC Plan:  HOME/SELF CARE      DC Planning Services  CM consult  Follow-up appt scheduled      Choice offered to / List presented to:             Status of service:  Completed, signed off Medicare Important Message given?   (If response is "NO", the following Medicare IM given date fields will be blank) Date Medicare IM given:   Date Additional Medicare IM given:    Discharge Disposition:  HOME/SELF CARE  Per UR Regulation:  Reviewed for med. necessity/level of care/duration of stay  If discussed at Long Length of Stay Meetings, dates discussed:    Comments:  11/09/12 12:51 Letha Cape RN, BSN [phone] patient sleep study scheduled for 6/22 and has a pulmonary f/u with Dr. Craige Cotta on 7/7 at 3:15.  Patient is for dc today.  11/08/12 10:50 Letha Cape RN BSN [phone] patient lives with spouse, pta indep.  NCM will continue to follow for dc needs.

## 2012-11-08 NOTE — Progress Notes (Signed)
[facility] PROGRESS NOTE  Kenneth Fox:[id number] DOB: 1970-09-26 DOA: 11/07/2012 PCP: Geraldo Pitter, MD  Assessment/Plan:  Nausea, vomiting, abdominal pain:  secondary viral gastroenteritis and  gastroparesis from uncontrolled diabetes Gastric emptying scan positive for 85% retention after 2 hours Start IV Reglan, continue IV Protonix Discontinue narcotic pain medications  CT not sig for any acute findings.  Start clear liquids, advance to low fiber diet as tolerated  Sleep apnea,  component of obesity hypoventilation syndrome   abg shows hypoxia.   nasal canula oxygen during day time and CPAP at night.  -sleep studies as outpatient.   Lactic acidosis : probably a combination of dehydration, from persistent vomiting and diarrhea and being on metformin. Resolved after IV hydration.   Diabetes mellitus:  Hemoglobin A1c is 11.1 Resume home lantus, SSI moderate   Hypertension:  resume home medications.   Diarrhea loose BM, Occasionaly,possibly from metformin. None since admission.  Abnormal chest xray New linear opacity in the region of the right middle lobe Follow-up PA and lateral chest radiograph in 2 weeks is recommended  to ensure resolution of this finding (i.e., to exclude the presence  of a central obstructing lesion). WBC now normalized, no fever.  Will not start antibiotics.    DVT Prophylaxis:  Lovenox  Code Status: Full Family Communication:  Disposition Plan: Inpatient. Discharge to home when appropriate      Procedures:  Nuclear medicine gastric emptying scan.   HPI/Subjective: No vomiting or diarrhea since admission to the hospital. Patient n.p.o. for gastric emptying scan. States he has no difficulty obtaining his diabetic medications and that he takes them regularly. States that his wife had vomiting and diarrhea several days before he did.  Objective: Filed Vitals:   11/07/12 2046 11/07/12 2203 11/08/12 0534 11/08/12 1533  BP:  156/93  127/87 136/78  Pulse: 96 95 80 79  Temp: 97.6 F (36.4 C)  97.5 F (36.4 C) 97.5 F (36.4 C)  TempSrc: Oral  Oral Oral  Resp: 19 18 18 20   Height:      Weight:      SpO2: 100% 98% 97% 92%    Intake/Output Summary (Last 24 hours) at 11/08/12 1659 Last data filed at 11/08/12 1500  Gross per 24 hour  Intake     60 ml  Output      0 ml  Net     60 ml   Filed Weights   11/07/12 1734  Weight: 140.842 kg (310 lb 8 oz)    Exam:   General:  A&O, NAD, Lying comfortably in bed  Cardiovascular: RRR, no murmurs, rubs or gallops, no lower extremity edema  Respiratory: CTA, no wheeze, crackles, or rales.  No increased work of breathing.  Abdomen: Soft, non-tender, non-distended, + bowel sounds, no masses  Musculoskeletal: Able to move all 4 extremities, 5/5 strength in each  Data Reviewed: Basic Metabolic Panel:  Recent Labs Lab 11/05/12 1703 11/07/12 0907 11/07/12 1825 11/08/12 0450  NA 133* 135  --  140  K 3.1* 3.3*  --  3.5  CL 92* 94*  --  102  CO2 29 23  --  31  GLUCOSE 245* 270*  --  174*  BUN 20 19  --  13  CREATININE 1.35 1.20 1.07 1.11  CALCIUM 9.7 9.6  --  9.1   Liver Function Tests:  Recent Labs Lab 11/05/12 1703 11/07/12 0907  AST 14 17  ALT 16 16  ALKPHOS 116 104  BILITOT 0.4 0.3  PROT 8.4* 8.0  ALBUMIN 3.7 3.4*    Recent Labs Lab 11/05/12 1703 11/07/12 0902  LIPASE 35 49   CBC:  Recent Labs Lab 11/05/12 1703 11/07/12 0902 11/07/12 1825 11/08/12 0450  WBC 10.0 12.9* 11.5* 8.8  NEUTROABS 6.8  --   --   --   HGB 15.4 14.7 14.4 13.2  HCT 43.6 41.8 42.7 39.5  MCV 78.3 78.4 79.5 79.3  PLT 240 248 244 242   BNP (last 3 results)  Recent Labs  11/07/12 1826  PROBNP 50.3   CBG:  Recent Labs Lab 11/07/12 1557 11/07/12 1808 11/07/12 2146 11/08/12 0742 11/08/12 1135  GLUCAP 284* 259* 233* 150* 108*      Studies: Nm Gastric Emptying  11/08/2012   *RADIOLOGY REPORT*  Clinical Data:  Abdominal pain with nausea,  vomiting, and early satiety.  Bloating.  Reflux.  NUCLEAR MEDICINE GASTRIC EMPTYING SCAN  Technique:  After oral ingestion of radiolabeled meal, sequential abdominal images were obtained for 120 minutes.  Residual percentage of activity remaining within the stomach was calculated at 60 and 120 minutes.  Radiopharmaceutical:  2.0 mCi Tc-15m sulfur colloid.  Comparison:  None.  Findings: The patient has  85% retention at 120 minutes.  Normal is less than 30% retention.  IMPRESSION: Abnormal gastric emptying exam consistent with gastroparesis.   Original Report Authenticated By: Francene Boyers, M.D.   Ct Abdomen Pelvis W Contrast  11/07/2012   *RADIOLOGY REPORT*  Clinical Data: Nausea, vomiting and diarrhea.  CT ABDOMEN AND PELVIS WITH CONTRAST  Technique:  Multidetector CT imaging of the abdomen and pelvis was performed following the standard protocol during bolus administration of intravenous contrast.  Contrast: OMNIPAQUE IOHEXOL 300 MG/ML  SOLN  Comparison: Chest and two views abdomen earlier this same date.  CT abdomen and pelvis 05/20/2011.  Findings: There is bibasilar airspace disease could be secondary to atelectasis or pneumonia.  No pleural or pericardial effusion is identified.  The liver is diffusely low attenuating consistent with fatty infiltration.  No focal liver lesion is identified.  The adrenal glands, spleen, pancreas and left kidney appear normal.  Small low attenuating lesion in the upper pole of the right kidney compatible with a cyst is unchanged.  The patient has a small fat containing umbilical hernia.  The stomach, small and large bowel and appendix appear normal.  There is no lymphadenopathy or fluid.  No focal bony abnormality is seen with postoperative change of a fixation of a posterior right acetabular fracture noted.  IMPRESSION:  1.  No acute finding in the abdomen or pelvis. 2.  Bibasilar airspace disease is could be due to atelectasis or pneumonia. 3.  Fatty infiltration of  the liver. 4.  Small fat containing umbilical hernia.   Original Report Authenticated By: Holley Dexter, M.D.   Dg Chest Port 1 View  11/07/2012   *RADIOLOGY REPORT*  Clinical Data: Shortness of breath.  PORTABLE CHEST - 1 VIEW  Comparison: Acute abdominal series 11/07/2012.  Findings: New linear opacity in the lower right hemithorax with partial obscuration of the right heart border, likely represents subsegmental atelectasis in the right lower lobe.  Lungs are otherwise clear.  No pleural effusions.  No evidence of pulmonary edema.  Mild cardiomegaly is unchanged.  Upper mediastinal contours are within normal limits.  IMPRESSION: 1.  New linear opacity in the region of the right middle lobe, most likely reflect subsegmental atelectasis in the right middle lobe. Follow-up PA and lateral chest radiograph in 2 weeks  is recommended to ensure resolution of this finding (i.e., to exclude the presence of a central obstructing lesion).   Original Report Authenticated By: Trudie Reed, M.D.   Dg Abd Acute W/chest  11/07/2012   *RADIOLOGY REPORT*  Clinical Data: Abdominal pain.  Nausea and vomiting.  ACUTE ABDOMEN SERIES (ABDOMEN 2 VIEW & CHEST 1 VIEW)  Comparison: CT abdomen pelvis 05/20/2011  Findings: The heart size is exaggerated by low lung volumes.  The lungs are clear.  Supine and decubitus views of the abdomen demonstrate a single dilated loop of bowel in the left lower quadrant.  The bowel gas pattern is otherwise unremarkable.  There are no fluid levels.  There is no evidence for obstruction. Postsurgical changes are noted in the right pelvis.  IMPRESSION:  1.  Low lung volumes without evidence for acute cardiopulmonary disease. 2.  Focal dilated loop of bowel in the left lower quadrant.  This could represent focal colitis or ileus. 3.  No evidence for obstruction or free air.   Original Report Authenticated By: Marin Roberts, M.D.    Scheduled Meds: . amLODipine  10 mg Oral Daily  .  enoxaparin (LOVENOX) injection  70 mg Subcutaneous Q24H  . hydrochlorothiazide  25 mg Oral Daily  . insulin aspart  0-15 Units Subcutaneous TID WC  . insulin aspart  0-5 Units Subcutaneous QHS  . insulin glargine  80 Units Subcutaneous QHS  . irbesartan  300 mg Oral Daily  . metoCLOPramide (REGLAN) injection  10 mg Intravenous TID AC & HS  . potassium chloride  40 mEq Oral BID  . sodium chloride  3 mL Intravenous Q12H   Continuous Infusions:   Active Problems:   Diabetes mellitus due to underlying condition with hyperglycemia   Obesity   Hypertension   Obstructive sleep apnea   Nausea with vomiting   Abdominal pain, unspecified site   Diarrhea   Conley Canal / APP  [facility] Pager [phone]. If 7PM-7AM, please contact night-coverage at [email or web address], password [facility] 11/08/2012, 4:59 PM  LOS: 1 day

## 2012-11-08 NOTE — Progress Notes (Signed)
Placed patient on CPAP QHS.  Patient tolerating CPAP well.   

## 2012-11-08 NOTE — Progress Notes (Signed)
Addendum  Patient seen and examined, chart and data base reviewed.  I agree with the above assessment and plan.  For full details please see Mrs. Algis Downs PA note.  Nausea/vomiting/abdominal pain, GES positive for gastroparesis.  Patient's scenario is consistent with transient gastroenteritis (his wife had the same symptoms).  He might have gastroparesis at baseline and it is flared up by transient gastroenteritis.   False positive results is not uncommon for the GES while patient still complaining about gastroenteritis symptoms.  Clint Lipps, MD [facility] Pager: [phone] 11/08/2012, 6:12 PM

## 2012-11-09 DIAGNOSIS — K3184 Gastroparesis: Secondary | ICD-10-CM

## 2012-11-09 LAB — GLUCOSE, CAPILLARY: Glucose-Capillary: 146 mg/dL — ABNORMAL HIGH (ref 70–99)

## 2012-11-09 LAB — T4, FREE: Free T4: 1.28 ng/dL (ref 0.80–1.80)

## 2012-11-09 MED ORDER — DULOXETINE HCL 60 MG PO CPEP
60.0000 mg | ORAL_CAPSULE | Freq: Two times a day (BID) | ORAL | Status: DC
  Filled 2012-11-09 (×2): qty 1

## 2012-11-09 MED ORDER — METOCLOPRAMIDE HCL 10 MG PO TABS
10.0000 mg | ORAL_TABLET | Freq: Three times a day (TID) | ORAL | Status: DC
  Administered 2012-11-09: 10 mg via ORAL
  Filled 2012-11-09: qty 1

## 2012-11-09 MED ORDER — OXYCODONE HCL 10 MG PO TABS: 5.0000 mg | ORAL_TABLET | Freq: Four times a day (QID) | ORAL | Status: DC | PRN

## 2012-11-09 MED ORDER — INSULIN ASPART 100 UNIT/ML ~~LOC~~ SOLN: 10.0000 [IU] | Freq: Three times a day (TID) | SUBCUTANEOUS | Status: DC

## 2012-11-09 MED ORDER — INSULIN GLARGINE 100 UNIT/ML ~~LOC~~ SOLN: 80.0000 [IU] | Freq: Every day | SUBCUTANEOUS | Status: DC

## 2012-11-09 MED ORDER — INDAPAMIDE 1.25 MG PO TABS
1.2500 mg | ORAL_TABLET | Freq: Every day | ORAL | Status: DC
  Administered 2012-11-09: 1.25 mg via ORAL
  Filled 2012-11-09: qty 1

## 2012-11-09 MED ORDER — METOCLOPRAMIDE HCL 5 MG/ML IJ SOLN
10.0000 mg | Freq: Three times a day (TID) | INTRAMUSCULAR | Status: DC
  Administered 2012-11-09: 10 mg via INTRAVENOUS
  Filled 2012-11-09 (×5): qty 2

## 2012-11-09 MED ORDER — METOCLOPRAMIDE HCL 10 MG PO TABS: 10.0000 mg | ORAL_TABLET | Freq: Three times a day (TID) | ORAL | Status: DC

## 2012-11-09 MED ORDER — TIZANIDINE HCL 4 MG PO TABS
8.0000 mg | ORAL_TABLET | Freq: Every day | ORAL | Status: DC
  Filled 2012-11-09: qty 2

## 2012-11-09 MED ORDER — PREGABALIN 300 MG PO CAPS: 300.0000 mg | ORAL_CAPSULE | Freq: Three times a day (TID) | ORAL | Status: DC

## 2012-11-09 NOTE — Discharge Summary (Signed)
Physician Discharge Summary  [name] RUE:[id number] DOB: [birth year] DOA: 11/07/2012  PCP: Geraldo Pitter, MD  Admit date: 11/07/2012 Discharge date: 11/09/2012  Time spent: 45 minutes  Recommendations for Outpatient Follow-up:  Sleep study scheduled will likely need CPAP Pulmonary follow up scheduled in July after Sleep study See PCP in 1 week for diabetic management (A1C is 11.1) doubt patient is taking his insulin appropriately. Cymbalta discontinued. Lyrica restarted.  Discharge Diagnoses:  Active Problems:   Diabetes mellitus due to underlying condition with hyperglycemia   Obesity   Hypertension   Obstructive sleep apnea   Nausea with vomiting   Abdominal pain, unspecified site   Diarrhea   Gastroparesis   Discharge Condition: much improved tolerating a regular low fiber diet.  Diet recommendation: heart healthy.   Clear Liquids when gastroparesis flares  Filed Weights   11/07/12 1734  Weight: 140.842 kg (310 lb 8 oz)    History of present illness:  Kenneth Fox is a 42 y.o. male with long h/o Hypertension, uncontrolled DM, morbid obesiity, peripheral neuropathy, came in for worsening nausea, vomiting, abdominal pain, and diarrhea since one week. On arrival to ED, He was found to be afebrile, with mild leukocytosis, and elevated lactic acid. A CT ABD was performed showed fatty liver without any acute findings. He was admitted to medical service for management of dehydration and lactic acidosis  Hospital Course:   Nausea, vomiting, abdominal pain: secondary viral gastroenteritis and gastroparesis from uncontrolled diabetes   Vomiting stopped at the time of admission.  Gastric emptying scan positive for 85% retention after 2 hours   Start IV Reglan, continue IV Protonix.  Changing to oral reglan at discharge.  Discontinue narcotic pain medications inpatient.  Will lower dose for outpatient.  CT not sig for any acute findings.   Advanced from clear liquids  to low fiber solid diet.  Tolerated diet well.  Sleep apnea, component of obesity hypoventilation syndrome   ABG shows hypoxia.   nasal canula oxygen during day time and CPAP at night while in the hospital.  patient sleep study scheduled for 6/22 and has a pulmonary f/u with Dr. Craige Cotta on 7/7 at 3:15  Lactic acidosis :   probably a combination of dehydration, from persistent vomiting and diarrhea and being on metformin.   Resolved after IV hydration.   Diabetes mellitus:   Hemoglobin A1c is 11.1   Resume home lantus and sliding scale novolog along with metformin at discharge.  Hypertension:   continue home medications.   Diarrhea   loose BM, now resolved.   None since admission.   Procedures:  NM Gastric emptying scan  Discharge Exam: Filed Vitals:   11/08/12 2116 11/08/12 2300 11/09/12 0613 11/09/12 1334  BP: 149/93  146/89 148/94  Pulse: 80  86 103  Temp: 97.2 F (36.2 C)  98.4 F (36.9 C) 98.4 F (36.9 C)  TempSrc: Oral  Oral Oral  Resp: 18  20 20   Height:      Weight:      SpO2: 97% 100% 97% 95%    General: A&O, NAD, Lying comfortably in bed  Cardiovascular: RRR, no murmurs, rubs or gallops, no lower extremity edema  Respiratory: CTA, no wheeze, crackles, or rales. No increased work of breathing.  Abdomen: Massive, soft, non-tender, non-distended, + bowel sounds, no masses  Musculoskeletal: Able to move all 4 extremities, 5/5 strength in each   Discharge Instructions      Discharge Orders   Future Appointments Provider Department Dept  Phone   11/27/2012 8:00 PM [facility] [phone]   12/12/2012 3:30 PM Coralyn Helling, MD [facility] [phone]   Future Orders Complete By Expires     Diet - low sodium heart healthy  As directed     Diet general  As directed     Scheduling Instructions:      Small Frequent meals.  Avoid Salads and raw vegetables when your stomach is hurting.     Increase activity slowly  As directed         Medication List    STOP taking these medications       DULoxetine 60 MG capsule  Commonly known as:  CYMBALTA     meloxicam 7.5 MG tablet  Commonly known as:  MOBIC      TAKE these medications       EXFORGE HCT 10-320-25 MG Tabs  Generic drug:  Amlodipine-Valsartan-HCTZ  Take 1 tablet by mouth daily.     indapamide 1.25 MG tablet  Commonly known as:  LOZOL  Take 1.25 mg by mouth daily.     insulin aspart 100 UNIT/ML injection  Commonly known as:  novoLOG  Inject 10-20 Units into the skin 3 (three) times daily with meals. per sliding scale  CBG 70 - 120: 0 units  CBG 121 - 150: 2 units  CBG 151 - 200: 3 units  CBG 201 - 250: 5 units  CBG 251 - 300: 8 units  CBG 301 - 350: 11 units  CBG 351 - 400: 15 units     insulin glargine 100 UNIT/ML injection  Commonly known as:  LANTUS  Inject 0.8 mLs (80 Units total) into the skin at bedtime.     metFORMIN 1000 MG tablet  Commonly known as:  GLUCOPHAGE  Take 1,000 mg by mouth 2 (two) times daily with a meal.     metoCLOPramide 10 MG tablet  Commonly known as:  REGLAN  Take 1 tablet (10 mg total) by mouth 3 (three) times daily before meals.     ondansetron 4 MG disintegrating tablet  Commonly known as:  ZOFRAN ODT  Take 1 tablet (4 mg total) by mouth every 8 (eight) hours as needed for nausea.     Oxycodone HCl 10 MG Tabs  Take 0.5 tablets (5 mg total) by mouth every 6 (six) hours as needed (for pain).     pregabalin 300 MG capsule  Commonly known as:  LYRICA  Take 1 capsule (300 mg total) by mouth 3 (three) times daily.     tiZANidine 4 MG tablet  Commonly known as:  ZANAFLEX  Take 8 mg by mouth at bedtime. For muscle spasms       No Known Allergies Follow-up Information   Follow up with [facility] On 11/27/2012. ( pm they will mail instructions to you)    Contact information:   [address] [place] [address] [phone]       Follow up with Coralyn Helling, MD On 12/12/2012. (3:15 pulmonologist, this is the first available apt )    Contact information:   [address] [place] [address] [phone]       Follow up with Geraldo Pitter, MD. Schedule an appointment as soon as possible for a visit in 2 weeks.   Contact information:   [address] [place] [address] [phone]  The results of significant diagnostics from this hospitalization (including imaging, microbiology, ancillary and laboratory) are listed below for reference.    Significant Diagnostic Studies: Nm Gastric Emptying  11/08/2012   *RADIOLOGY REPORT*  Clinical Data:  Abdominal pain with nausea, vomiting, and early satiety.  Bloating.  Reflux.  NUCLEAR MEDICINE GASTRIC EMPTYING SCAN  Technique:  After oral ingestion of radiolabeled meal, sequential abdominal images were obtained for 120 minutes.  Residual percentage of activity remaining within the stomach was calculated at 60 and 120 minutes.  Radiopharmaceutical:  2.0 mCi Tc-75m sulfur colloid.  Comparison:  None.  Findings: The patient has  85% retention at 120 minutes.  Normal is less than 30% retention.  IMPRESSION: Abnormal gastric emptying exam consistent with gastroparesis.   Original Report Authenticated By: Francene Boyers, M.D.   Ct Abdomen Pelvis W Contrast  11/07/2012   *RADIOLOGY REPORT*  Clinical Data: Nausea, vomiting and diarrhea.  CT ABDOMEN AND PELVIS WITH CONTRAST  Technique:  Multidetector CT imaging of the abdomen and pelvis was performed following the standard protocol during bolus administration of intravenous contrast.  Contrast: OMNIPAQUE IOHEXOL 300 MG/ML  SOLN  Comparison: Chest and two views abdomen earlier this same date.  CT abdomen and pelvis 05/20/2011.  Findings: There is bibasilar airspace disease could be secondary to atelectasis or pneumonia.  No pleural or pericardial effusion is identified.  The liver is diffusely low attenuating  consistent with fatty infiltration.  No focal liver lesion is identified.  The adrenal glands, spleen, pancreas and left kidney appear normal.  Small low attenuating lesion in the upper pole of the right kidney compatible with a cyst is unchanged.  The patient has a small fat containing umbilical hernia.  The stomach, small and large bowel and appendix appear normal.  There is no lymphadenopathy or fluid.  No focal bony abnormality is seen with postoperative change of a fixation of a posterior right acetabular fracture noted.  IMPRESSION:  1.  No acute finding in the abdomen or pelvis. 2.  Bibasilar airspace disease is could be due to atelectasis or pneumonia. 3.  Fatty infiltration of the liver. 4.  Small fat containing umbilical hernia.   Original Report Authenticated By: Holley Dexter, M.D.   Dg Chest Port 1 View  11/07/2012   *RADIOLOGY REPORT*  Clinical Data: Shortness of breath.  PORTABLE CHEST - 1 VIEW  Comparison: Acute abdominal series 11/07/2012.  Findings: New linear opacity in the lower right hemithorax with partial obscuration of the right heart border, likely represents subsegmental atelectasis in the right lower lobe.  Lungs are otherwise clear.  No pleural effusions.  No evidence of pulmonary edema.  Mild cardiomegaly is unchanged.  Upper mediastinal contours are within normal limits.  IMPRESSION: 1.  New linear opacity in the region of the right middle lobe, most likely reflect subsegmental atelectasis in the right middle lobe. Follow-up PA and lateral chest radiograph in 2 weeks is recommended to ensure resolution of this finding (i.e., to exclude the presence of a central obstructing lesion).   Original Report Authenticated By: Trudie Reed, M.D.   Dg Abd Acute W/chest  11/07/2012   *RADIOLOGY REPORT*  Clinical Data: Abdominal pain.  Nausea and vomiting.  ACUTE ABDOMEN SERIES (ABDOMEN 2 VIEW & CHEST 1 VIEW)  Comparison: CT abdomen pelvis 05/20/2011  Findings: The heart size is  exaggerated by low lung volumes.  The lungs are clear.  Supine and decubitus views of the abdomen demonstrate a single dilated loop of bowel in the  left lower quadrant.  The bowel gas pattern is otherwise unremarkable.  There are no fluid levels.  There is no evidence for obstruction. Postsurgical changes are noted in the right pelvis.  IMPRESSION:  1.  Low lung volumes without evidence for acute cardiopulmonary disease. 2.  Focal dilated loop of bowel in the left lower quadrant.  This could represent focal colitis or ileus. 3.  No evidence for obstruction or free air.   Original Report Authenticated By: Marin Roberts, M.D.     Labs: Basic Metabolic Panel:  Recent Labs Lab 11/05/12 1703 11/07/12 0907 11/07/12 1825 11/08/12 0450  NA 133* 135  --  140  K 3.1* 3.3*  --  3.5  CL 92* 94*  --  102  CO2 29 23  --  31  GLUCOSE 245* 270*  --  174*  BUN 20 19  --  13  CREATININE 1.35 1.20 1.07 1.11  CALCIUM 9.7 9.6  --  9.1   Liver Function Tests:  Recent Labs Lab 11/05/12 1703 11/07/12 0907  AST 14 17  ALT 16 16  ALKPHOS 116 104  BILITOT 0.4 0.3  PROT 8.4* 8.0  ALBUMIN 3.7 3.4*    Recent Labs Lab 11/05/12 1703 11/07/12 0902  LIPASE 35 49  CBC:  Recent Labs Lab 11/05/12 1703 11/07/12 0902 11/07/12 1825 11/08/12 0450  WBC 10.0 12.9* 11.5* 8.8  NEUTROABS 6.8  --   --   --   HGB 15.4 14.7 14.4 13.2  HCT 43.6 41.8 42.7 39.5  MCV 78.3 78.4 79.5 79.3  PLT 240 248 244 242    BNP (last 3 results)  Recent Labs  11/07/12 1826  PROBNP 50.3   CBG:  Recent Labs Lab 11/08/12 1135 11/08/12 1707 11/08/12 2314 11/09/12 0737 11/09/12 1129  GLUCAP 108* 155* 117* 129* 146*    Signed:  York, Marianne L, PA-C / APP  [facility] 11/09/2012, 1:41 PM  Attending Patient seen and examined, agree with the assessment and plan as outlined above. Patient has tolerated a regular diet, he has been counseled regarding eating small quantity meals, he need further  optimization of his diabetic regimen. He is tolerating reglan well, and will be discharged home today. Sleep study and a outpatient subsequent follow up with Pulmonology has also been arranged  S Namir Neto

## 2012-11-09 NOTE — Progress Notes (Signed)
UR COMPLETED  

## 2012-11-09 NOTE — Progress Notes (Signed)
NURSING PROGRESS NOTE  Kenneth Fox [id number] Discharge Data: 11/09/2012 3:29 PM Attending Provider: Clydia Llano, MD YQM:VHQIO,NGEXB J, MD     Marya Landry Guard to be D/C'd Home per MD order.  Discussed with the patient the After Visit Summary and all questions fully answered. All IV's discontinued with no bleeding noted. All belongings returned to patient for patient to take home.   Last Vital Signs:  Blood pressure 148/94, pulse 103, temperature 98.4 F (36.9 C), temperature source Oral, resp. rate 20, height 5\' 9"  (1.753 m), weight 140.842 kg (310 lb 8 oz), SpO2 95.00%.  Discharge Medication List   Medication List    STOP taking these medications       DULoxetine 60 MG capsule  Commonly known as:  CYMBALTA     meloxicam 7.5 MG tablet  Commonly known as:  MOBIC      TAKE these medications       EXFORGE HCT 10-320-25 MG Tabs  Generic drug:  Amlodipine-Valsartan-HCTZ  Take 1 tablet by mouth daily.     indapamide 1.25 MG tablet  Commonly known as:  LOZOL  Take 1.25 mg by mouth daily.     insulin aspart 100 UNIT/ML injection  Commonly known as:  novoLOG  Inject 10-20 Units into the skin 3 (three) times daily with meals. per sliding scale  CBG 70 - 120: 0 units  CBG 121 - 150: 2 units  CBG 151 - 200: 3 units  CBG 201 - 250: 5 units  CBG 251 - 300: 8 units  CBG 301 - 350: 11 units  CBG 351 - 400: 15 units     insulin glargine 100 UNIT/ML injection  Commonly known as:  LANTUS  Inject 0.8 mLs (80 Units total) into the skin at bedtime.     metFORMIN 1000 MG tablet  Commonly known as:  GLUCOPHAGE  Take 1,000 mg by mouth 2 (two) times daily with a meal.     metoCLOPramide 10 MG tablet  Commonly known as:  REGLAN  Take 1 tablet (10 mg total) by mouth 3 (three) times daily before meals.     ondansetron 4 MG disintegrating tablet  Commonly known as:  ZOFRAN ODT  Take 1 tablet (4 mg total) by mouth every 8 (eight) hours as needed for nausea.     Oxycodone  HCl 10 MG Tabs  Take 0.5 tablets (5 mg total) by mouth every 6 (six) hours as needed (for pain).     pregabalin 300 MG capsule  Commonly known as:  LYRICA  Take 1 capsule (300 mg total) by mouth 3 (three) times daily.     tiZANidine 4 MG tablet  Commonly known as:  ZANAFLEX  Take 8 mg by mouth at bedtime. For muscle spasms

## 2012-11-27 ENCOUNTER — Ambulatory Visit (HOSPITAL_BASED_OUTPATIENT_CLINIC_OR_DEPARTMENT_OTHER): Payer: Medicaid Other | Attending: Internal Medicine

## 2012-12-12 ENCOUNTER — Institutional Professional Consult (permissible substitution): Payer: Medicaid Other | Admitting: Pulmonary Disease

## 2012-12-15 ENCOUNTER — Encounter: Payer: Self-pay | Admitting: Pulmonary Disease

## 2013-07-30 ENCOUNTER — Ambulatory Visit (HOSPITAL_BASED_OUTPATIENT_CLINIC_OR_DEPARTMENT_OTHER): Payer: Medicaid Other | Attending: Specialist

## 2013-09-04 ENCOUNTER — Encounter (HOSPITAL_COMMUNITY): Payer: Self-pay | Admitting: Emergency Medicine

## 2013-09-04 ENCOUNTER — Emergency Department (HOSPITAL_COMMUNITY)
Admission: EM | Admit: 2013-09-04 | Discharge: 2013-09-04 | Discharge status: Against Medical Advice | Payer: Medicaid Other | Attending: Emergency Medicine | Admitting: Emergency Medicine

## 2013-09-04 ENCOUNTER — Emergency Department (HOSPITAL_COMMUNITY): Payer: Medicaid Other

## 2013-09-04 DIAGNOSIS — E669 Obesity, unspecified: Secondary | ICD-10-CM | POA: Insufficient documentation

## 2013-09-04 DIAGNOSIS — E119 Type 2 diabetes mellitus without complications: Secondary | ICD-10-CM | POA: Insufficient documentation

## 2013-09-04 DIAGNOSIS — I10 Essential (primary) hypertension: Secondary | ICD-10-CM | POA: Insufficient documentation

## 2013-09-04 DIAGNOSIS — K5900 Constipation, unspecified: Secondary | ICD-10-CM | POA: Insufficient documentation

## 2013-09-04 DIAGNOSIS — J45909 Unspecified asthma, uncomplicated: Secondary | ICD-10-CM | POA: Insufficient documentation

## 2013-09-04 NOTE — ED Notes (Signed)
Pt was sent from PCP this morning for constipation for 4 days. Pt states that he has a history of DM and gastroenteritis (that causes his stomach to not digest food) pt states that he has been vomiting and not had a bowel movement for 4 days. Pt denies bloody emesis.

## 2013-09-05 ENCOUNTER — Emergency Department (HOSPITAL_COMMUNITY)
Admission: EM | Admit: 2013-09-05 | Discharge: 2013-09-05 | Disposition: A | Discharge status: Home / Self Care | Payer: Medicaid Other | Attending: Emergency Medicine | Admitting: Emergency Medicine

## 2013-09-05 ENCOUNTER — Encounter (HOSPITAL_COMMUNITY): Payer: Self-pay | Admitting: Emergency Medicine

## 2013-09-05 DIAGNOSIS — F411 Generalized anxiety disorder: Secondary | ICD-10-CM | POA: Insufficient documentation

## 2013-09-05 DIAGNOSIS — J45909 Unspecified asthma, uncomplicated: Secondary | ICD-10-CM | POA: Insufficient documentation

## 2013-09-05 DIAGNOSIS — E279 Disorder of adrenal gland, unspecified: Secondary | ICD-10-CM | POA: Insufficient documentation

## 2013-09-05 DIAGNOSIS — G4730 Sleep apnea, unspecified: Secondary | ICD-10-CM | POA: Insufficient documentation

## 2013-09-05 DIAGNOSIS — R109 Unspecified abdominal pain: Secondary | ICD-10-CM

## 2013-09-05 DIAGNOSIS — K5900 Constipation, unspecified: Secondary | ICD-10-CM

## 2013-09-05 DIAGNOSIS — M7989 Other specified soft tissue disorders: Secondary | ICD-10-CM

## 2013-09-05 DIAGNOSIS — D573 Sickle-cell trait: Secondary | ICD-10-CM | POA: Insufficient documentation

## 2013-09-05 DIAGNOSIS — R609 Edema, unspecified: Secondary | ICD-10-CM | POA: Insufficient documentation

## 2013-09-05 DIAGNOSIS — K5289 Other specified noninfective gastroenteritis and colitis: Secondary | ICD-10-CM | POA: Insufficient documentation

## 2013-09-05 DIAGNOSIS — F3289 Other specified depressive episodes: Secondary | ICD-10-CM | POA: Insufficient documentation

## 2013-09-05 DIAGNOSIS — Z794 Long term (current) use of insulin: Secondary | ICD-10-CM | POA: Insufficient documentation

## 2013-09-05 DIAGNOSIS — E669 Obesity, unspecified: Secondary | ICD-10-CM | POA: Insufficient documentation

## 2013-09-05 DIAGNOSIS — E119 Type 2 diabetes mellitus without complications: Secondary | ICD-10-CM | POA: Insufficient documentation

## 2013-09-05 DIAGNOSIS — F329 Major depressive disorder, single episode, unspecified: Secondary | ICD-10-CM | POA: Insufficient documentation

## 2013-09-05 LAB — COMPREHENSIVE METABOLIC PANEL
ALBUMIN: 3.2 g/dL — AB (ref 3.5–5.2)
ALK PHOS: 93 U/L (ref 39–117)
ALT: 18 U/L (ref 0–53)
AST: 17 U/L (ref 0–37)
BUN: 10 mg/dL (ref 6–23)
CALCIUM: 9.4 mg/dL (ref 8.4–10.5)
CO2: 25 mEq/L (ref 19–32)
Chloride: 99 mEq/L (ref 96–112)
Creatinine, Ser: 0.96 mg/dL (ref 0.50–1.35)
GFR calc Af Amer: >90 mL/min (ref >90)
GFR calc non Af Amer: >90 mL/min (ref >90)
Glucose, Bld: 292 mg/dL — ABNORMAL HIGH (ref 70–99)
POTASSIUM: 3.9 meq/L (ref 3.7–5.3)
SODIUM: 138 meq/L (ref 137–147)
TOTAL PROTEIN: 7.7 g/dL (ref 6.0–8.3)
Total Bilirubin: 0.5 mg/dL (ref 0.3–1.2)

## 2013-09-05 LAB — CBC WITH DIFFERENTIAL/PLATELET
BASOS PCT: 0 % (ref 0–1)
Basophils Absolute: 0 10*3/uL (ref 0.0–0.1)
EOS ABS: 0 10*3/uL (ref 0.0–0.7)
Eosinophils Relative: 1 % (ref 0–5)
HCT: 44.8 % (ref 39.0–52.0)
Hemoglobin: 15.2 g/dL (ref 13.0–17.0)
Lymphocytes Relative: 17 % (ref 12–46)
Lymphs Abs: 1.3 10*3/uL (ref 0.7–4.0)
MCH: 28.1 pg (ref 26.0–34.0)
MCHC: 33.9 g/dL (ref 30.0–36.0)
MCV: 83 fL (ref 78.0–100.0)
MONOS PCT: 7 % (ref 3–12)
Monocytes Absolute: 0.5 10*3/uL (ref 0.1–1.0)
NEUTROS PCT: 75 % (ref 43–77)
Neutro Abs: 6 10*3/uL (ref 1.7–7.7)
PLATELETS: 233 10*3/uL (ref 150–400)
RBC: 5.4 MIL/uL (ref 4.22–5.81)
RDW: 16.2 % — ABNORMAL HIGH (ref 11.5–15.5)
WBC: 7.8 10*3/uL (ref 4.0–10.5)

## 2013-09-05 LAB — LIPASE, BLOOD: LIPASE: 41 U/L (ref 11–59)

## 2013-09-05 MED ORDER — POLYETHYLENE GLYCOL 3350 17 G PO PACK: 17.0000 g | PACK | Freq: Two times a day (BID) | ORAL | Status: DC

## 2013-09-05 MED ORDER — DOCUSATE SODIUM 100 MG PO CAPS: 100.0000 mg | ORAL_CAPSULE | Freq: Two times a day (BID) | ORAL | Status: DC

## 2013-09-05 NOTE — Discharge Instructions (Signed)
Abdominal Pain, Adult °Many things can cause abdominal pain. Usually, abdominal pain is not caused by a disease and will improve without treatment. It can often be observed and treated at home. Your health care provider will do a physical exam and possibly order blood tests and X-rays to help determine the seriousness of your pain. However, in many cases, more time must pass before a clear cause of the pain can be found. Before that point, your health care provider may not know if you need more testing or further treatment. °HOME CARE INSTRUCTIONS  °Monitor your abdominal pain for any changes. The following actions may help to alleviate any discomfort you are experiencing: °· Only take over-the-counter or prescription medicines as directed by your health care provider. °· Do not take laxatives unless directed to do so by your health care provider. °· Try a clear liquid diet (broth, tea, or water) as directed by your health care provider. Slowly move to a bland diet as tolerated. °SEEK MEDICAL CARE IF: °· You have unexplained abdominal pain. °· You have abdominal pain associated with nausea or diarrhea. °· You have pain when you urinate or have a bowel movement. °· You experience abdominal pain that wakes you in the night. °· You have abdominal pain that is worsened or improved by eating food. °· You have abdominal pain that is worsened with eating fatty foods. °SEEK IMMEDIATE MEDICAL CARE IF:  °· Your pain does not go away within 2 hours. °· You have a fever. °· You keep throwing up (vomiting). °· Your pain is felt only in portions of the abdomen, such as the right side or the left lower portion of the abdomen. °· You pass bloody or black tarry stools. °MAKE SURE YOU: °· Understand these instructions.   °· Will watch your condition.   °· Will get help right away if you are not doing well or get worse.   °Document Released: 03/04/2005 Document Revised: 03/15/2013 Document Reviewed: 02/01/2013 °[employer]® Patient  Information ©2014 [employer]. ° °

## 2013-09-05 NOTE — ED Notes (Signed)
Pt was here yesterday for same but left ama. Pt reports generalized abd pain, was constipated for several days but now having bowel movements. Pt thinks this is side effect from lyrica. No acute distress noted at triage.
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
## 2013-09-06 ENCOUNTER — Encounter (HOSPITAL_COMMUNITY): Payer: Self-pay | Admitting: Emergency Medicine

## 2013-09-06 ENCOUNTER — Emergency Department (HOSPITAL_COMMUNITY)
Admission: EM | Admit: 2013-09-06 | Discharge: 2013-09-06 | Disposition: A | Discharge status: Home / Self Care | Payer: Medicaid Other | Attending: Emergency Medicine | Admitting: Emergency Medicine

## 2013-09-06 ENCOUNTER — Emergency Department (HOSPITAL_COMMUNITY): Payer: Medicaid Other

## 2013-09-06 ENCOUNTER — Other Ambulatory Visit: Payer: Self-pay

## 2013-09-06 DIAGNOSIS — Z79899 Other long term (current) drug therapy: Secondary | ICD-10-CM | POA: Insufficient documentation

## 2013-09-06 DIAGNOSIS — M7989 Other specified soft tissue disorders: Secondary | ICD-10-CM | POA: Insufficient documentation

## 2013-09-06 DIAGNOSIS — Z794 Long term (current) use of insulin: Secondary | ICD-10-CM | POA: Insufficient documentation

## 2013-09-06 DIAGNOSIS — R110 Nausea: Secondary | ICD-10-CM | POA: Insufficient documentation

## 2013-09-06 DIAGNOSIS — Z862 Personal history of diseases of the blood and blood-forming organs and certain disorders involving the immune mechanism: Secondary | ICD-10-CM | POA: Insufficient documentation

## 2013-09-06 DIAGNOSIS — F3289 Other specified depressive episodes: Secondary | ICD-10-CM | POA: Insufficient documentation

## 2013-09-06 DIAGNOSIS — Z8669 Personal history of other diseases of the nervous system and sense organs: Secondary | ICD-10-CM | POA: Insufficient documentation

## 2013-09-06 DIAGNOSIS — I10 Essential (primary) hypertension: Secondary | ICD-10-CM | POA: Insufficient documentation

## 2013-09-06 DIAGNOSIS — Z8719 Personal history of other diseases of the digestive system: Secondary | ICD-10-CM | POA: Insufficient documentation

## 2013-09-06 DIAGNOSIS — F329 Major depressive disorder, single episode, unspecified: Secondary | ICD-10-CM | POA: Insufficient documentation

## 2013-09-06 DIAGNOSIS — J45909 Unspecified asthma, uncomplicated: Secondary | ICD-10-CM | POA: Insufficient documentation

## 2013-09-06 DIAGNOSIS — E119 Type 2 diabetes mellitus without complications: Secondary | ICD-10-CM | POA: Insufficient documentation

## 2013-09-06 DIAGNOSIS — F410 Panic disorder [episodic paroxysmal anxiety] without agoraphobia: Secondary | ICD-10-CM | POA: Insufficient documentation

## 2013-09-06 HISTORY — DX: Obesity, unspecified: E66.9

## 2013-09-06 LAB — CBC
HCT: 42.3 % (ref 39.0–52.0)
Hemoglobin: 14.2 g/dL (ref 13.0–17.0)
MCH: 27.9 pg (ref 26.0–34.0)
MCHC: 33.6 g/dL (ref 30.0–36.0)
MCV: 83.1 fL (ref 78.0–100.0)
Platelets: 221 10*3/uL (ref 150–400)
RBC: 5.09 MIL/uL (ref 4.22–5.81)
RDW: 15.8 % — AB (ref 11.5–15.5)
WBC: 8.6 10*3/uL (ref 4.0–10.5)

## 2013-09-06 LAB — I-STAT TROPONIN, ED
TROPONIN I, POC: 0 ng/mL (ref 0.00–0.08)
Troponin i, poc: 0.01 ng/mL (ref 0.00–0.08)

## 2013-09-06 LAB — I-STAT CHEM 8, ED
BUN: 11 mg/dL (ref 6–23)
Calcium, Ion: 1.16 mmol/L (ref 1.12–1.23)
Chloride: 101 mEq/L (ref 96–112)
Creatinine, Ser: 1.3 mg/dL (ref 0.50–1.35)
GLUCOSE: 151 mg/dL — AB (ref 70–99)
HEMATOCRIT: 47 % (ref 39.0–52.0)
Hemoglobin: 16 g/dL (ref 13.0–17.0)
POTASSIUM: 3 meq/L — AB (ref 3.7–5.3)
Sodium: 141 mEq/L (ref 137–147)
TCO2: 27 mmol/L (ref 0–100)

## 2013-09-06 LAB — PROTIME-INR
INR: 1 (ref 0.00–1.49)
Prothrombin Time: 13 seconds (ref 11.6–15.2)

## 2013-09-06 LAB — APTT: aPTT: 27 seconds (ref 24–37)

## 2013-09-06 MED ORDER — NITROGLYCERIN 0.4 MG SL SUBL
0.4000 mg | SUBLINGUAL_TABLET | SUBLINGUAL | Status: AC | PRN
  Administered 2013-09-06 (×3): 0.4 mg via SUBLINGUAL

## 2013-09-06 MED ORDER — LORAZEPAM 1 MG PO TABS
2.0000 mg | ORAL_TABLET | Freq: Once | ORAL | Status: AC
  Administered 2013-09-06: 2 mg via ORAL
  Filled 2013-09-06: qty 2

## 2013-09-06 MED ORDER — LORAZEPAM 1 MG PO TABS: 1.0000 mg | ORAL_TABLET | Freq: Three times a day (TID) | ORAL | Status: DC | PRN

## 2013-09-06 MED ORDER — ASPIRIN 81 MG PO CHEW: 324.0000 mg | CHEWABLE_TABLET | Freq: Once | ORAL | Status: DC

## 2013-09-06 NOTE — ED Notes (Signed)
Phlebotomy at the bedside  

## 2013-09-06 NOTE — ED Notes (Signed)
Pt. arrived with EMS from home reports mid chest pain / tightness with SOB onset this morning , denies nausea or diaphoresis . Pt. received ASA 324 mg and 1 NTG sl with slight relief . Seen here yesterday morning for abdominal pain /constipation .
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
## 2013-09-06 NOTE — Discharge Instructions (Signed)
Panic Attacks  Panic attacks are sudden, short-lived surges of severe anxiety, fear, or discomfort. They may occur for no reason when you are relaxed, when you are anxious, or when you are sleeping. Panic attacks may occur for a number of reasons:   · Healthy people occasionally have panic attacks in extreme, life-threatening situations, such as war or natural disasters. Normal anxiety is a protective mechanism of the body that helps us react to danger (fight or flight response).  · Panic attacks are often seen with anxiety disorders, such as panic disorder, social anxiety disorder, generalized anxiety disorder, and phobias. Anxiety disorders cause excessive or uncontrollable anxiety. They may interfere with your relationships or other life activities.  · Panic attacks are sometimes seen with other mental illnesses such as depression and posttraumatic stress disorder.  · Certain medical conditions, prescription medicines, and drugs of abuse can cause panic attacks.  SYMPTOMS   Panic attacks start suddenly, peak within 20 minutes, and are accompanied by four or more of the following symptoms:  · Pounding heart or fast heart rate (palpitations).  · Sweating.  · Trembling or shaking.  · Shortness of breath or feeling smothered.  · Feeling choked.  · Chest pain or discomfort.  · Nausea or strange feeling in your stomach.  · Dizziness, lightheadedness, or feeling like you will faint.  · Chills or hot flushes.  · Numbness or tingling in your lips or hands and feet.  · Feeling that things are not real or feeling that you are not yourself.  · Fear of losing control or going crazy.  · Fear of dying.  Some of these symptoms can mimic serious medical conditions. For example, you may think you are having a heart attack. Although panic attacks can be very scary, they are not life threatening.  DIAGNOSIS   Panic attacks are diagnosed through an assessment by your health care provider. Your health care provider will ask questions  about your symptoms, such as where and when they occurred. Your health care provider will also ask about your medical history and use of alcohol and drugs, including prescription medicines. Your health care provider may order blood tests or other studies to rule out a serious medical condition. Your health care provider may refer you to a mental health professional for further evaluation.  TREATMENT   · Most healthy people who have one or two panic attacks in an extreme, life-threatening situation will not require treatment.  · The treatment for panic attacks associated with anxiety disorders or other mental illness typically involves counseling with a mental health professional, medicine, or a combination of both. Your health care provider will help determine what treatment is best for you.  · Panic attacks due to physical illness usually goes away with treatment of the illness. If prescription medicine is causing panic attacks, talk with your health care provider about stopping the medicine, decreasing the dose, or substituting another medicine.  · Panic attacks due to alcohol or drug abuse goes away with abstinence. Some adults need professional help in order to stop drinking or using drugs.  HOME CARE INSTRUCTIONS   · Take all your medicines as prescribed.    · Check with your health care provider before starting new prescription or over-the-counter medicines.  · Keep all follow up appointments with your health care provider.  SEEK MEDICAL CARE IF:  · You are not able to take your medicines as prescribed.  · Your symptoms do not improve or get worse.  SEEK IMMEDIATE   MEDICAL CARE IF:   · You experience panic attack symptoms that are different than your usual symptoms.  · You have serious thoughts about hurting yourself or others.  · You are taking medicine for panic attacks and have a serious side effect.  MAKE SURE YOU:  · Understand these instructions.  · Will watch your condition.  · Will get help right away  if you are not doing well or get worse.  Document Released: 05/25/2005 Document Revised: 03/15/2013 Document Reviewed: 01/06/2013  Exit[employer]® Patient Information ©2014 [employer].

## 2013-10-04 ENCOUNTER — Encounter (HOSPITAL_COMMUNITY): Payer: Self-pay | Admitting: Emergency Medicine

## 2013-10-04 ENCOUNTER — Emergency Department (HOSPITAL_COMMUNITY)
Admission: EM | Admit: 2013-10-04 | Discharge: 2013-10-04 | Disposition: A | Discharge status: Home / Self Care | Payer: Medicaid Other | Attending: Emergency Medicine | Admitting: Emergency Medicine

## 2013-10-04 DIAGNOSIS — D573 Sickle-cell trait: Secondary | ICD-10-CM | POA: Insufficient documentation

## 2013-10-04 DIAGNOSIS — K5289 Other specified noninfective gastroenteritis and colitis: Secondary | ICD-10-CM | POA: Insufficient documentation

## 2013-10-04 DIAGNOSIS — K219 Gastro-esophageal reflux disease without esophagitis: Secondary | ICD-10-CM

## 2013-10-04 DIAGNOSIS — J45909 Unspecified asthma, uncomplicated: Secondary | ICD-10-CM | POA: Insufficient documentation

## 2013-10-04 DIAGNOSIS — E119 Type 2 diabetes mellitus without complications: Secondary | ICD-10-CM | POA: Insufficient documentation

## 2013-10-04 DIAGNOSIS — Z79899 Other long term (current) drug therapy: Secondary | ICD-10-CM | POA: Insufficient documentation

## 2013-10-04 DIAGNOSIS — R112 Nausea with vomiting, unspecified: Secondary | ICD-10-CM | POA: Insufficient documentation

## 2013-10-04 DIAGNOSIS — K5900 Constipation, unspecified: Secondary | ICD-10-CM | POA: Insufficient documentation

## 2013-10-04 DIAGNOSIS — F329 Major depressive disorder, single episode, unspecified: Secondary | ICD-10-CM | POA: Insufficient documentation

## 2013-10-04 DIAGNOSIS — G589 Mononeuropathy, unspecified: Secondary | ICD-10-CM | POA: Insufficient documentation

## 2013-10-04 DIAGNOSIS — E669 Obesity, unspecified: Secondary | ICD-10-CM | POA: Insufficient documentation

## 2013-10-04 DIAGNOSIS — G4730 Sleep apnea, unspecified: Secondary | ICD-10-CM | POA: Insufficient documentation

## 2013-10-04 DIAGNOSIS — I10 Essential (primary) hypertension: Secondary | ICD-10-CM | POA: Insufficient documentation

## 2013-10-04 DIAGNOSIS — F3289 Other specified depressive episodes: Secondary | ICD-10-CM | POA: Insufficient documentation

## 2013-10-04 DIAGNOSIS — F411 Generalized anxiety disorder: Secondary | ICD-10-CM | POA: Insufficient documentation

## 2013-10-04 DIAGNOSIS — Z794 Long term (current) use of insulin: Secondary | ICD-10-CM | POA: Insufficient documentation

## 2013-10-04 LAB — CBC WITH DIFFERENTIAL/PLATELET
Basophils Absolute: 0 10*3/uL (ref 0.0–0.1)
Basophils Relative: 0 % (ref 0–1)
EOS PCT: 1 % (ref 0–5)
Eosinophils Absolute: 0.1 10*3/uL (ref 0.0–0.7)
HCT: 44 % (ref 39.0–52.0)
Hemoglobin: 15 g/dL (ref 13.0–17.0)
LYMPHS ABS: 1.4 10*3/uL (ref 0.7–4.0)
LYMPHS PCT: 17 % (ref 12–46)
MCH: 28.2 pg (ref 26.0–34.0)
MCHC: 34.1 g/dL (ref 30.0–36.0)
MCV: 82.9 fL (ref 78.0–100.0)
MONOS PCT: 10 % (ref 3–12)
Monocytes Absolute: 0.8 10*3/uL (ref 0.1–1.0)
Neutro Abs: 5.9 10*3/uL (ref 1.7–7.7)
Neutrophils Relative %: 72 % (ref 43–77)
PLATELETS: 267 10*3/uL (ref 150–400)
RBC: 5.31 MIL/uL (ref 4.22–5.81)
RDW: 14.9 % (ref 11.5–15.5)
WBC: 8.2 10*3/uL (ref 4.0–10.5)

## 2013-10-04 LAB — COMPREHENSIVE METABOLIC PANEL
ALT: 21 U/L (ref 0–53)
AST: 18 U/L (ref 0–37)
Albumin: 3.2 g/dL — ABNORMAL LOW (ref 3.5–5.2)
Alkaline Phosphatase: 95 U/L (ref 39–117)
BILIRUBIN TOTAL: 0.3 mg/dL (ref 0.3–1.2)
BUN: 7 mg/dL (ref 6–23)
CHLORIDE: 104 meq/L (ref 96–112)
CO2: 28 meq/L (ref 19–32)
CREATININE: 0.8 mg/dL (ref 0.50–1.35)
Calcium: 9.6 mg/dL (ref 8.4–10.5)
GFR calc Af Amer: >90 mL/min (ref >90)
GLUCOSE: 139 mg/dL — AB (ref 70–99)
Potassium: 3.3 mEq/L — ABNORMAL LOW (ref 3.7–5.3)
SODIUM: 145 meq/L (ref 137–147)
Total Protein: 7.7 g/dL (ref 6.0–8.3)

## 2013-10-04 LAB — URINE MICROSCOPIC-ADD ON

## 2013-10-04 LAB — URINALYSIS, ROUTINE W REFLEX MICROSCOPIC
BILIRUBIN URINE: NEGATIVE
Glucose, UA: 500 mg/dL — AB
Ketones, ur: NEGATIVE mg/dL
Leukocytes, UA: NEGATIVE
Nitrite: NEGATIVE
Specific Gravity, Urine: 1.019 (ref 1.005–1.030)
UROBILINOGEN UA: 0.2 mg/dL (ref 0.0–1.0)
pH: 7 (ref 5.0–8.0)

## 2013-10-04 LAB — LIPASE, BLOOD: LIPASE: 45 U/L (ref 11–59)

## 2013-10-04 MED ORDER — ENALAPRILAT 1.25 MG/ML IV SOLN
1.2500 mg | Freq: Once | INTRAVENOUS | Status: AC
  Administered 2013-10-04: 1.25 mg via INTRAVENOUS
  Filled 2013-10-04: qty 1

## 2013-10-04 MED ORDER — OMEPRAZOLE 20 MG PO CPDR: 20.0000 mg | DELAYED_RELEASE_CAPSULE | Freq: Two times a day (BID) | ORAL | Status: DC

## 2013-10-04 MED ORDER — MORPHINE SULFATE 4 MG/ML IJ SOLN
4.0000 mg | INTRAMUSCULAR | Status: DC | PRN
  Administered 2013-10-04: 4 mg via INTRAVENOUS
  Filled 2013-10-04: qty 1

## 2013-10-04 MED ORDER — METOCLOPRAMIDE HCL 5 MG/ML IJ SOLN
10.0000 mg | Freq: Once | INTRAMUSCULAR | Status: AC
  Administered 2013-10-04: 10 mg via INTRAVENOUS
  Filled 2013-10-04: qty 2

## 2013-10-04 MED ORDER — ONDANSETRON HCL 4 MG/2ML IJ SOLN
4.0000 mg | Freq: Once | INTRAMUSCULAR | Status: AC
  Administered 2013-10-04: 4 mg via INTRAVENOUS
  Filled 2013-10-04: qty 2

## 2013-10-04 MED ORDER — ONDANSETRON HCL 4 MG PO TABS: 4.0000 mg | ORAL_TABLET | Freq: Four times a day (QID) | ORAL | Status: DC

## 2013-10-04 MED ORDER — FAMOTIDINE IN NACL 20-0.9 MG/50ML-% IV SOLN
20.0000 mg | Freq: Once | INTRAVENOUS | Status: AC
  Administered 2013-10-04: 20 mg via INTRAVENOUS
  Filled 2013-10-04: qty 50

## 2013-10-04 NOTE — ED Notes (Signed)
Pt tolerating PO fluids without issue. 

## 2013-10-04 NOTE — Discharge Instructions (Signed)
Gastroesophageal Reflux Disease, Adult  Gastroesophageal reflux disease (GERD) happens when acid from your stomach flows up into the esophagus. When acid comes in contact with the esophagus, the acid causes soreness (inflammation) in the esophagus. Over time, GERD may create small holes (ulcers) in the lining of the esophagus.  CAUSES   · Increased body weight. This puts pressure on the stomach, making acid rise from the stomach into the esophagus.  · Smoking. This increases acid production in the stomach.  · Drinking alcohol. This causes decreased pressure in the lower esophageal sphincter (valve or ring of muscle between the esophagus and stomach), allowing acid from the stomach into the esophagus.  · Late evening meals and a full stomach. This increases pressure and acid production in the stomach.  · A malformed lower esophageal sphincter.  Sometimes, no cause is found.  SYMPTOMS   · Burning pain in the lower part of the mid-chest behind the breastbone and in the mid-stomach area. This may occur twice a week or more often.  · Trouble swallowing.  · Sore throat.  · Dry cough.  · Asthma-like symptoms including chest tightness, shortness of breath, or wheezing.  DIAGNOSIS   Your caregiver may be able to diagnose GERD based on your symptoms. In some cases, X-rays and other tests may be done to check for complications or to check the condition of your stomach and esophagus.  TREATMENT   Your caregiver may recommend over-the-counter or prescription medicines to help decrease acid production. Ask your caregiver before starting or adding any new medicines.   HOME CARE INSTRUCTIONS   · Change the factors that you can control. Ask your caregiver for guidance concerning weight loss, quitting smoking, and alcohol consumption.  · Avoid foods and drinks that make your symptoms worse, such as:  · Caffeine or alcoholic drinks.  · Chocolate.  · Peppermint or mint flavorings.  · Garlic and onions.  · Spicy foods.  · Citrus fruits,  such as oranges, lemons, or limes.  · Tomato-based foods such as sauce, chili, salsa, and pizza.  · Fried and fatty foods.  · Avoid lying down for the 3 hours prior to your bedtime or prior to taking a nap.  · Eat small, frequent meals instead of large meals.  · Wear loose-fitting clothing. Do not wear anything tight around your waist that causes pressure on your stomach.  · Raise the head of your bed 6 to 8 inches with wood blocks to help you sleep. Extra pillows will not help.  · Only take over-the-counter or prescription medicines for pain, discomfort, or fever as directed by your caregiver.  · Do not take aspirin, ibuprofen, or other nonsteroidal anti-inflammatory drugs (NSAIDs).  SEEK IMMEDIATE MEDICAL CARE IF:   · You have pain in your arms, neck, jaw, teeth, or back.  · Your pain increases or changes in intensity or duration.  · You develop nausea, vomiting, or sweating (diaphoresis).  · You develop shortness of breath, or you faint.  · Your vomit is green, yellow, black, or looks like coffee grounds or blood.  · Your stool is red, bloody, or black.  These symptoms could be signs of other problems, such as heart disease, gastric bleeding, or esophageal bleeding.  MAKE SURE YOU:   · Understand these instructions.  · Will watch your condition.  · Will get help right away if you are not doing well or get worse.  Document Released: 03/04/2005 Document Revised: 08/17/2011 Document Reviewed: 12/12/2010  Exit[employer]® Patient   Information ©2014 [employer].

## 2013-10-04 NOTE — ED Notes (Signed)
Pt noted to be throwing up after RN finished assessing pt.  Pt given 4 mg of Zofran IV per protocol.

## 2013-10-04 NOTE — ED Notes (Signed)
Pt given ginger ale.

## 2013-10-04 NOTE — ED Notes (Addendum)
Pt having generalized abdominal pain since last night. sts started after a hot dog. sts N,V. CBG 163, BP 192/122. Manual. 84 pulse RR 20

## 2013-10-05 ENCOUNTER — Emergency Department (HOSPITAL_COMMUNITY)
Admission: EM | Admit: 2013-10-05 | Discharge: 2013-10-06 | Disposition: A | Discharge status: Home / Self Care | Payer: Medicaid Other | Attending: Emergency Medicine | Admitting: Emergency Medicine

## 2013-10-05 ENCOUNTER — Emergency Department (HOSPITAL_COMMUNITY): Payer: Medicaid Other

## 2013-10-05 ENCOUNTER — Encounter (HOSPITAL_COMMUNITY): Payer: Self-pay | Admitting: Emergency Medicine

## 2013-10-05 DIAGNOSIS — R109 Unspecified abdominal pain: Secondary | ICD-10-CM

## 2013-10-05 DIAGNOSIS — Z862 Personal history of diseases of the blood and blood-forming organs and certain disorders involving the immune mechanism: Secondary | ICD-10-CM | POA: Insufficient documentation

## 2013-10-05 DIAGNOSIS — I10 Essential (primary) hypertension: Secondary | ICD-10-CM | POA: Insufficient documentation

## 2013-10-05 DIAGNOSIS — J45909 Unspecified asthma, uncomplicated: Secondary | ICD-10-CM | POA: Insufficient documentation

## 2013-10-05 DIAGNOSIS — K5900 Constipation, unspecified: Secondary | ICD-10-CM | POA: Insufficient documentation

## 2013-10-05 DIAGNOSIS — R112 Nausea with vomiting, unspecified: Secondary | ICD-10-CM | POA: Insufficient documentation

## 2013-10-05 DIAGNOSIS — Z794 Long term (current) use of insulin: Secondary | ICD-10-CM | POA: Insufficient documentation

## 2013-10-05 DIAGNOSIS — F3289 Other specified depressive episodes: Secondary | ICD-10-CM | POA: Insufficient documentation

## 2013-10-05 DIAGNOSIS — E119 Type 2 diabetes mellitus without complications: Secondary | ICD-10-CM | POA: Insufficient documentation

## 2013-10-05 DIAGNOSIS — F329 Major depressive disorder, single episode, unspecified: Secondary | ICD-10-CM | POA: Insufficient documentation

## 2013-10-05 DIAGNOSIS — G589 Mononeuropathy, unspecified: Secondary | ICD-10-CM | POA: Insufficient documentation

## 2013-10-05 DIAGNOSIS — F411 Generalized anxiety disorder: Secondary | ICD-10-CM | POA: Insufficient documentation

## 2013-10-05 DIAGNOSIS — E669 Obesity, unspecified: Secondary | ICD-10-CM | POA: Insufficient documentation

## 2013-10-05 DIAGNOSIS — R1084 Generalized abdominal pain: Secondary | ICD-10-CM | POA: Insufficient documentation

## 2013-10-05 DIAGNOSIS — Z79899 Other long term (current) drug therapy: Secondary | ICD-10-CM | POA: Insufficient documentation

## 2013-10-05 LAB — COMPREHENSIVE METABOLIC PANEL
ALT: 22 U/L (ref 0–53)
AST: 18 U/L (ref 0–37)
Albumin: 3.1 g/dL — ABNORMAL LOW (ref 3.5–5.2)
Alkaline Phosphatase: 88 U/L (ref 39–117)
BILIRUBIN TOTAL: 0.3 mg/dL (ref 0.3–1.2)
BUN: 6 mg/dL (ref 6–23)
CHLORIDE: 100 meq/L (ref 96–112)
CO2: 28 meq/L (ref 19–32)
CREATININE: 0.93 mg/dL (ref 0.50–1.35)
Calcium: 9.3 mg/dL (ref 8.4–10.5)
GLUCOSE: 215 mg/dL — AB (ref 70–99)
Potassium: 3.5 mEq/L — ABNORMAL LOW (ref 3.7–5.3)
Sodium: 140 mEq/L (ref 137–147)
Total Protein: 7.4 g/dL (ref 6.0–8.3)

## 2013-10-05 LAB — URINALYSIS, ROUTINE W REFLEX MICROSCOPIC
Bilirubin Urine: NEGATIVE
Ketones, ur: NEGATIVE mg/dL
LEUKOCYTES UA: NEGATIVE
Nitrite: NEGATIVE
Protein, ur: >300 mg/dL — AB
Specific Gravity, Urine: 1.021 (ref 1.005–1.030)
UROBILINOGEN UA: 1 mg/dL (ref 0.0–1.0)
pH: 6 (ref 5.0–8.0)

## 2013-10-05 LAB — URINE MICROSCOPIC-ADD ON

## 2013-10-05 LAB — CBC WITH DIFFERENTIAL/PLATELET
Basophils Absolute: 0 10*3/uL (ref 0.0–0.1)
Basophils Relative: 0 % (ref 0–1)
EOS ABS: 0.1 10*3/uL (ref 0.0–0.7)
Eosinophils Relative: 1 % (ref 0–5)
HCT: 43.8 % (ref 39.0–52.0)
Hemoglobin: 14.8 g/dL (ref 13.0–17.0)
LYMPHS ABS: 1.2 10*3/uL (ref 0.7–4.0)
Lymphocytes Relative: 14 % (ref 12–46)
MCH: 28.1 pg (ref 26.0–34.0)
MCHC: 33.8 g/dL (ref 30.0–36.0)
MCV: 83.3 fL (ref 78.0–100.0)
Monocytes Absolute: 1 10*3/uL (ref 0.1–1.0)
Monocytes Relative: 11 % (ref 3–12)
NEUTROS PCT: 74 % (ref 43–77)
Neutro Abs: 6.7 10*3/uL (ref 1.7–7.7)
Platelets: 258 10*3/uL (ref 150–400)
RBC: 5.26 MIL/uL (ref 4.22–5.81)
RDW: 15 % (ref 11.5–15.5)
WBC: 9 10*3/uL (ref 4.0–10.5)

## 2013-10-05 LAB — LIPASE, BLOOD: Lipase: 46 U/L (ref 11–59)

## 2013-10-05 LAB — I-STAT TROPONIN, ED: Troponin i, poc: 0 ng/mL (ref 0.00–0.08)

## 2013-10-05 MED ORDER — ONDANSETRON HCL 4 MG/2ML IJ SOLN
4.0000 mg | Freq: Once | INTRAMUSCULAR | Status: AC
  Administered 2013-10-05: 4 mg via INTRAVENOUS
  Filled 2013-10-05: qty 2

## 2013-10-05 MED ORDER — OXYCODONE-ACETAMINOPHEN 5-325 MG PO TABS
1.0000 | ORAL_TABLET | Freq: Once | ORAL | Status: AC
  Administered 2013-10-05: 1 via ORAL
  Filled 2013-10-05: qty 1

## 2013-10-05 MED ORDER — IOHEXOL 300 MG/ML  SOLN
100.0000 mL | Freq: Once | INTRAMUSCULAR | Status: AC | PRN
  Administered 2013-10-05: 100 mL via INTRAVENOUS

## 2013-10-05 MED ORDER — HYDROMORPHONE HCL PF 1 MG/ML IJ SOLN
1.0000 mg | Freq: Once | INTRAMUSCULAR | Status: AC
  Administered 2013-10-05: 1 mg via INTRAVENOUS
  Filled 2013-10-05: qty 1

## 2013-10-05 MED ORDER — IOHEXOL 300 MG/ML  SOLN
25.0000 mL | Freq: Once | INTRAMUSCULAR | Status: AC | PRN
  Administered 2013-10-05: 25 mL via ORAL

## 2013-10-05 NOTE — ED Notes (Signed)
PER EMS - pt c/o generalized abd pain x 2 days. Seen here yesterday for same thing. Given Prilosec and zofran. Pt reports this has been ongoing for a year and usually he gets morphine then he feels better. BP 142 palpated HR 80 CBG 180, hx of DM.

## 2013-10-05 NOTE — ED Notes (Addendum)
Pt got onto stretcher in hall way and laid now. Pt informed this is not appropriate. Asked to please get back into wheelchair. Pt not cooperating, security called.

## 2013-10-05 NOTE — ED Notes (Signed)
Pt making "vomtining" noises. No sign of emesis seen. Pt stopped making these noises when visitors walked by.

## 2013-10-05 NOTE — ED Notes (Signed)
Pt. O2 reading in the 80s. Upon entering patient room, patient asleep. Woke patient up and oxygen came up to 96%. Pt. Reports sleep apnea. Placed on 2L oxygen.

## 2013-10-05 NOTE — ED Notes (Signed)
Pt. Moaning at bedside c/o N/V and generalized abdominal pain. States was seen here last night and the medicine has not helped. Denies diarrhea/constipation.

## 2013-10-05 NOTE — ED Notes (Signed)
Pt requesting pain medicine.

## 2013-10-05 NOTE — ED Provider Notes (Signed)
CSN: [id number]     Arrival date & time 10/04/13  1653 History   First MD Initiated Contact with Patient 10/04/13 2002     Chief Complaint  Patient presents with  . Abdominal Pain      HPI  Presents with complaint of vomiting and abdominal pain. Started vomiting last night. He has burning in his substernal chest and upper abdomen. States he feels better for a while. He is nauseated. He vomited. He feels better again. Has history of insulin-dependent diabetes. History of gastroparesis. No dyspnea. No fever. No peripheral edema.  Past Medical History  Diagnosis Date  . Diabetes mellitus   . Hypertension   . Asthma     childhood  . Neuropathy   . Anxiety   . Depression   . Sickle cell trait   . Sleep apnea   . Gastroenteritis   . Obesity   . Constipation   . Sleep apnea    History reviewed. No pertinent past surgical history. Family History  Problem Relation Age of Onset  . Diabetes Father   . Neuropathy Father   . Hypertension Father   . Sickle cell anemia Mother    History  Substance Use Topics  . Smoking status: Never Smoker   . Smokeless tobacco: Not on file  . Alcohol Use: No    Review of Systems  Constitutional: Negative for fever, chills, diaphoresis, appetite change and fatigue.  HENT: Negative for mouth sores, sore throat and trouble swallowing.   Eyes: Negative for visual disturbance.  Respiratory: Negative for cough, chest tightness, shortness of breath and wheezing.   Cardiovascular: Positive for chest pain.  Gastrointestinal: Positive for nausea and vomiting. Negative for abdominal pain, diarrhea and abdominal distention.  Endocrine: Negative for polydipsia, polyphagia and polyuria.  Genitourinary: Negative for dysuria, frequency and hematuria.  Musculoskeletal: Negative for gait problem.  Skin: Negative for color change, pallor and rash.  Neurological: Negative for dizziness, syncope, light-headedness and headaches.  Hematological: Does not  bruise/bleed easily.  Psychiatric/Behavioral: Negative for behavioral problems and confusion.      Allergies  Review of patient's allergies indicates no known allergies.  Home Medications   Prior to Admission medications   Medication Sig Start Date End Date Taking? Authorizing Provider  insulin aspart (NOVOLOG) 100 UNIT/ML injection Inject 10-20 Units into the skin 3 (three) times daily with meals. per sliding scale CBG 70 - 120: 0 units CBG 121 - 150: 2 units CBG 151 - 200: 3 units CBG 201 - 250: 5 units CBG 251 - 300: 8 units CBG 301 - 350: 11 units CBG 351 - 400: 15 units 11/09/12  Yes Marianne L York, PA-C  insulin detemir (LEVEMIR) 100 UNIT/ML injection Inject 100 Units into the skin at bedtime. Out of medication. Pt does have current refills   Yes Historical Provider, MD  LORazepam (ATIVAN) 1 MG tablet Take 1 tablet (1 mg total) by mouth 3 (three) times daily as needed for anxiety. 09/06/13  Yes Rolland PorterMark Merrell Rettinger, MD  metFORMIN (GLUCOPHAGE) 1000 MG tablet Take 1,000 mg by mouth 2 (two) times daily with a meal.   Yes Historical Provider, MD  OxyCODONE (OXYCONTIN) 10 mg T12A 12 hr tablet Take 10 mg by mouth every 12 (twelve) hours.   Yes Historical Provider, MD  polyethylene glycol (MIRALAX / GLYCOLAX) packet Take 17 g by mouth 2 (two) times daily. 09/05/13  Yes Audree CamelScott T Goldston, MD  pregabalin (LYRICA) 300 MG capsule Take 1 capsule (300 mg total) by mouth 3 (  three) times daily. 11/09/12  Yes Tora KindredMarianne L York, PA-C  Ranitidine HCl (ACID REDUCER PO) Take 1 capsule by mouth daily as needed (acid reflux).   Yes Historical Provider, MD  lisinopril-hydrochlorothiazide (PRINZIDE,ZESTORETIC) 20-25 MG per tablet Take 1 tablet by mouth daily.    Historical Provider, MD  omeprazole (PRILOSEC) 20 MG capsule Take 1 capsule (20 mg total) by mouth 2 (two) times daily. 10/04/13   Rolland PorterMark Sharmarke Cicio, MD  ondansetron (ZOFRAN) 4 MG tablet Take 1 tablet (4 mg total) by mouth every 6 (six) hours. 10/04/13   Rolland PorterMark Julie-Ann Vanmaanen, MD    BP 134/78  Pulse 84  Temp(Src) 98.7 F (37.1 C) (Oral)  Resp 22  SpO2 99% Physical Exam  Constitutional: He is oriented to person, place, and time. He appears well-developed and well-nourished. No distress.  Obese black male. Channel surfing on the TV. Appears in no distress.  HENT:  Head: Normocephalic.  Eyes: Conjunctivae are normal. Pupils are equal, round, and reactive to light. No scleral icterus.  Neck: Normal range of motion. Neck supple. No thyromegaly present.  Cardiovascular: Normal rate and regular rhythm.  Exam reveals no gallop and no friction rub.   No murmur heard. Pulmonary/Chest: Effort normal and breath sounds normal. No respiratory distress. He has no wheezes. He has no rales.  Abdominal: Soft. Bowel sounds are normal. He exhibits no distension. There is no tenderness. There is no rebound.  Musculoskeletal: Normal range of motion.  Neurological: He is alert and oriented to person, place, and time.  Skin: Skin is warm and dry. No rash noted.  Psychiatric: He has a normal mood and affect. His behavior is normal.    ED Course  Procedures (including critical care time) Labs Review Labs Reviewed  COMPREHENSIVE METABOLIC PANEL - Abnormal; Notable for the following:    Potassium 3.3 (*)    Glucose, Bld 139 (*)    Albumin 3.2 (*)    All other components within normal limits  URINALYSIS, ROUTINE W REFLEX MICROSCOPIC - Abnormal; Notable for the following:    Glucose, UA 500 (*)    Hgb urine dipstick TRACE (*)    Protein, ur >300 (*)    All other components within normal limits  URINE MICROSCOPIC-ADD ON - Abnormal; Notable for the following:    Squamous Epithelial / LPF FEW (*)    All other components within normal limits  CBC WITH DIFFERENTIAL  LIPASE, BLOOD    Imaging Review No results found.   EKG Interpretation None      MDM   Final diagnoses:  GERD (gastroesophageal reflux disease)    Tolerating by mouth liquids. No white blood cell count.  Normal hepatobiliary and pancreatic enzymes. Appropriate for outpatient treatment. Plan reflux diet. Reglan. Recheck as needed.   Rolland PorterMark Katerra Ingman, MD 10/05/13 [phone]

## 2013-10-05 NOTE — ED Notes (Signed)
Pt asking to see the doctor now, explained to pt that there is a wait but we are working to get him back as quickly as possible.

## 2013-10-05 NOTE — ED Notes (Signed)
Pt sts he isn't feeling well and needs to see the doctor now. Pt told to stop trying to get out of the wheelchair if he isn't feel good because we don't want him to fall.

## 2013-10-06 ENCOUNTER — Emergency Department (HOSPITAL_COMMUNITY)
Admission: EM | Admit: 2013-10-06 | Discharge: 2013-10-06 | Disposition: A | Discharge status: Home / Self Care | Payer: Medicaid Other | Source: Home / Self Care | Attending: Emergency Medicine | Admitting: Emergency Medicine

## 2013-10-06 ENCOUNTER — Encounter (HOSPITAL_COMMUNITY): Payer: Self-pay | Admitting: Emergency Medicine

## 2013-10-06 DIAGNOSIS — Z794 Long term (current) use of insulin: Secondary | ICD-10-CM

## 2013-10-06 DIAGNOSIS — E119 Type 2 diabetes mellitus without complications: Secondary | ICD-10-CM | POA: Insufficient documentation

## 2013-10-06 DIAGNOSIS — E669 Obesity, unspecified: Secondary | ICD-10-CM

## 2013-10-06 DIAGNOSIS — Z862 Personal history of diseases of the blood and blood-forming organs and certain disorders involving the immune mechanism: Secondary | ICD-10-CM

## 2013-10-06 DIAGNOSIS — F411 Generalized anxiety disorder: Secondary | ICD-10-CM | POA: Insufficient documentation

## 2013-10-06 DIAGNOSIS — R1084 Generalized abdominal pain: Secondary | ICD-10-CM | POA: Insufficient documentation

## 2013-10-06 DIAGNOSIS — R109 Unspecified abdominal pain: Secondary | ICD-10-CM

## 2013-10-06 DIAGNOSIS — J45909 Unspecified asthma, uncomplicated: Secondary | ICD-10-CM

## 2013-10-06 DIAGNOSIS — G589 Mononeuropathy, unspecified: Secondary | ICD-10-CM | POA: Insufficient documentation

## 2013-10-06 DIAGNOSIS — Z79899 Other long term (current) drug therapy: Secondary | ICD-10-CM

## 2013-10-06 DIAGNOSIS — I10 Essential (primary) hypertension: Secondary | ICD-10-CM

## 2013-10-06 DIAGNOSIS — Z8719 Personal history of other diseases of the digestive system: Secondary | ICD-10-CM | POA: Insufficient documentation

## 2013-10-06 DIAGNOSIS — R739 Hyperglycemia, unspecified: Secondary | ICD-10-CM

## 2013-10-06 LAB — CBG MONITORING, ED
Glucose-Capillary: 381 mg/dL — ABNORMAL HIGH (ref 70–99)
Glucose-Capillary: 347 mg/dL — ABNORMAL HIGH (ref 70–99)

## 2013-10-06 MED ORDER — HYDROMORPHONE HCL PF 1 MG/ML IJ SOLN
1.0000 mg | Freq: Once | INTRAMUSCULAR | Status: AC
  Administered 2013-10-06: 1 mg via INTRAVENOUS
  Filled 2013-10-06: qty 1

## 2013-10-06 MED ORDER — SODIUM CHLORIDE 0.9 % IV BOLUS (SEPSIS): 1000.0000 mL | INTRAVENOUS | Status: DC

## 2013-10-06 MED ORDER — OXYCODONE-ACETAMINOPHEN 5-325 MG PO TABS: 1.0000 | ORAL_TABLET | ORAL | Status: DC | PRN

## 2013-10-06 MED ORDER — ONDANSETRON HCL 4 MG/2ML IJ SOLN: 4.0000 mg | Freq: Once | INTRAMUSCULAR | Status: DC

## 2013-10-06 MED ORDER — INSULIN ASPART 100 UNIT/ML ~~LOC~~ SOLN
15.0000 [IU] | Freq: Once | SUBCUTANEOUS | Status: AC
  Administered 2013-10-06: 15 [IU] via SUBCUTANEOUS
  Filled 2013-10-06: qty 1

## 2013-10-06 MED ORDER — ONDANSETRON HCL 4 MG PO TABS: 4.0000 mg | ORAL_TABLET | Freq: Four times a day (QID) | ORAL | Status: DC

## 2013-10-06 MED ORDER — ONDANSETRON HCL 4 MG/2ML IJ SOLN
4.0000 mg | Freq: Once | INTRAMUSCULAR | Status: AC
  Administered 2013-10-06: 4 mg via INTRAVENOUS
  Filled 2013-10-06: qty 2

## 2013-10-06 NOTE — ED Provider Notes (Signed)
Medical screening examination/treatment/procedure(s) were performed by non-physician practitioner and as supervising physician I was immediately available for consultation/collaboration.   EKG Interpretation None       Sherrika Weakland L Shahana Capes, MD 10/06/13 1030 

## 2013-10-06 NOTE — ED Notes (Signed)
Pt refused to sign discharge instructions.

## 2013-10-06 NOTE — ED Provider Notes (Signed)
CSN: [id number]     Arrival date & time 10/05/13  1631 History   First MD Initiated Contact with Patient 10/05/13 2030     Chief Complaint  Patient presents with  . Abdominal Pain     (Consider location/radiation/quality/duration/timing/severity/associated sxs/prior Treatment) Patient is a 43 y.o. male presenting with abdominal pain. The history is provided by the patient. No language interpreter was used.  Abdominal Pain Pain location:  Generalized Pain quality: aching and sharp   Pain radiates to:  Does not radiate Pain severity:  Severe Onset quality:  Gradual Associated symptoms: nausea and vomiting   Associated symptoms: no chills, no diarrhea and no fever   Associated symptoms comment:  Complains of generalized abdominal pain since yesterday. He reports he was seen emergently yesterday in the ED but returns with worsening pain despite use of the medications given. No fever, or diarrhea. He has nausea with vomiting of non-bloody emesis. He denies previous history of obstruction or abdominal surgeries.    Past Medical History  Diagnosis Date  . Diabetes mellitus   . Hypertension   . Asthma     childhood  . Neuropathy   . Anxiety   . Depression   . Sickle cell trait   . Sleep apnea   . Gastroenteritis   . Obesity   . Constipation   . Sleep apnea    History reviewed. No pertinent past surgical history. Family History  Problem Relation Age of Onset  . Diabetes Father   . Neuropathy Father   . Hypertension Father   . Sickle cell anemia Mother    History  Substance Use Topics  . Smoking status: Never Smoker   . Smokeless tobacco: Not on file  . Alcohol Use: No    Review of Systems  Constitutional: Negative for fever and chills.  Respiratory: Negative.   Cardiovascular: Negative.   Gastrointestinal: Positive for nausea, vomiting and abdominal pain. Negative for diarrhea.  Musculoskeletal: Negative.   Skin: Negative.   Neurological: Negative.        Allergies  Review of patient's allergies indicates no known allergies.  Home Medications   Prior to Admission medications   Medication Sig Start Date End Date Taking? Authorizing Provider  insulin aspart (NOVOLOG) 100 UNIT/ML injection Inject 10-20 Units into the skin 3 (three) times daily with meals. per sliding scale CBG 70 - 120: 0 units CBG 121 - 150: 2 units CBG 151 - 200: 3 units CBG 201 - 250: 5 units CBG 251 - 300: 8 units CBG 301 - 350: 11 units CBG 351 - 400: 15 units 11/09/12   Marianne L York, PA-C  insulin detemir (LEVEMIR) 100 UNIT/ML injection Inject 100 Units into the skin at bedtime. Out of medication. Pt does have current refills    Historical Provider, MD  lisinopril-hydrochlorothiazide (PRINZIDE,ZESTORETIC) 20-25 MG per tablet Take 1 tablet by mouth daily.    Historical Provider, MD  LORazepam (ATIVAN) 1 MG tablet Take 1 tablet (1 mg total) by mouth 3 (three) times daily as needed for anxiety. 09/06/13   Rolland PorterMark James, MD  metFORMIN (GLUCOPHAGE) 1000 MG tablet Take 1,000 mg by mouth 2 (two) times daily with a meal.    Historical Provider, MD  omeprazole (PRILOSEC) 20 MG capsule Take 1 capsule (20 mg total) by mouth 2 (two) times daily. 10/04/13   Rolland PorterMark James, MD  ondansetron (ZOFRAN) 4 MG tablet Take 1 tablet (4 mg total) by mouth every 6 (six) hours. 10/04/13   Rolland PorterMark James, MD  OxyCODONE (OXYCONTIN) 10 mg T12A 12 hr tablet Take 10 mg by mouth every 12 (twelve) hours.    Historical Provider, MD  polyethylene glycol (MIRALAX / GLYCOLAX) packet Take 17 g by mouth 2 (two) times daily. 09/05/13   Audree CamelScott T Goldston, MD  pregabalin (LYRICA) 300 MG capsule Take 1 capsule (300 mg total) by mouth 3 (three) times daily. 11/09/12   Tora KindredMarianne L York, PA-C  Ranitidine HCl (ACID REDUCER PO) Take 1 capsule by mouth daily as needed (acid reflux).    Historical Provider, MD   BP 179/83  Pulse 88  Temp(Src) 97.4 F (36.3 C) (Oral)  Resp 22  SpO2 94% Physical Exam  Constitutional: He is  oriented to person, place, and time. He appears well-developed and well-nourished.  HENT:  Head: Normocephalic.  Neck: Normal range of motion. Neck supple.  Cardiovascular: Normal rate and regular rhythm.   Pulmonary/Chest: Effort normal and breath sounds normal.  Abdominal: Soft. Bowel sounds are normal. There is tenderness. There is no rebound and no guarding.  Diffuse tenderness to morbidly obese abdomen.  Musculoskeletal: Normal range of motion.  Neurological: He is alert and oriented to person, place, and time.  Skin: Skin is warm and dry. No rash noted.  Psychiatric: He has a normal mood and affect.    ED Course  Procedures (including critical care time) Labs Review Labs Reviewed  COMPREHENSIVE METABOLIC PANEL - Abnormal; Notable for the following:    Potassium 3.5 (*)    Glucose, Bld 215 (*)    Albumin 3.1 (*)    All other components within normal limits  URINALYSIS, ROUTINE W REFLEX MICROSCOPIC - Abnormal; Notable for the following:    Glucose, UA >1000 (*)    Hgb urine dipstick SMALL (*)    Protein, ur >300 (*)    All other components within normal limits  URINE MICROSCOPIC-ADD ON - Abnormal; Notable for the following:    Squamous Epithelial / LPF FEW (*)    Casts GRANULAR CAST (*)    All other components within normal limits  LIPASE, BLOOD  CBC WITH DIFFERENTIAL  I-STAT TROPOININ, ED   Results for orders placed during the hospital encounter of 10/05/13  LIPASE, BLOOD      Result Value Ref Range   Lipase 46  11 - 59 U/L  COMPREHENSIVE METABOLIC PANEL      Result Value Ref Range   Sodium 140  137 - 147 mEq/L   Potassium 3.5 (*) 3.7 - 5.3 mEq/L   Chloride 100  96 - 112 mEq/L   CO2 28  19 - 32 mEq/L   Glucose, Bld 215 (*) 70 - 99 mg/dL   BUN 6  6 - 23 mg/dL   Creatinine, Ser 8.110.93  0.50 - 1.35 mg/dL   Calcium 9.3  8.4 - 91.410.5 mg/dL   Total Protein 7.4  6.0 - 8.3 g/dL   Albumin 3.1 (*) 3.5 - 5.2 g/dL   AST 18  0 - 37 U/L   ALT 22  0 - 53 U/L   Alkaline  Phosphatase 88  39 - 117 U/L   Total Bilirubin 0.3  0.3 - 1.2 mg/dL   GFR calc non Af Amer >90  >90 mL/min   GFR calc Af Amer >90  >90 mL/min  CBC WITH DIFFERENTIAL      Result Value Ref Range   WBC 9.0  4.0 - 10.5 K/uL   RBC 5.26  4.22 - 5.81 MIL/uL   Hemoglobin 14.8  13.0 -  17.0 g/dL   HCT 16.1  09.6 - 04.5 %   MCV 83.3  78.0 - 100.0 fL   MCH 28.1  26.0 - 34.0 pg   MCHC 33.8  30.0 - 36.0 g/dL   RDW 40.9  81.1 - 91.4 %   Platelets 258  150 - 400 K/uL   Neutrophils Relative % 74  43 - 77 %   Neutro Abs 6.7  1.7 - 7.7 K/uL   Lymphocytes Relative 14  12 - 46 %   Lymphs Abs 1.2  0.7 - 4.0 K/uL   Monocytes Relative 11  3 - 12 %   Monocytes Absolute 1.0  0.1 - 1.0 K/uL   Eosinophils Relative 1  0 - 5 %   Eosinophils Absolute 0.1  0.0 - 0.7 K/uL   Basophils Relative 0  0 - 1 %   Basophils Absolute 0.0  0.0 - 0.1 K/uL  URINALYSIS, ROUTINE W REFLEX MICROSCOPIC      Result Value Ref Range   Color, Urine YELLOW  YELLOW   APPearance CLEAR  CLEAR   Specific Gravity, Urine 1.021  1.005 - 1.030   pH 6.0  5.0 - 8.0   Glucose, UA >1000 (*) NEGATIVE mg/dL   Hgb urine dipstick SMALL (*) NEGATIVE   Bilirubin Urine NEGATIVE  NEGATIVE   Ketones, ur NEGATIVE  NEGATIVE mg/dL   Protein, ur >782 (*) NEGATIVE mg/dL   Urobilinogen, UA 1.0  0.0 - 1.0 mg/dL   Nitrite NEGATIVE  NEGATIVE   Leukocytes, UA NEGATIVE  NEGATIVE  URINE MICROSCOPIC-ADD ON      Result Value Ref Range   Squamous Epithelial / LPF FEW (*) RARE   WBC, UA 3-6  <3 WBC/hpf   RBC / HPF 0-2  <3 RBC/hpf   Bacteria, UA RARE  RARE   Casts GRANULAR CAST (*) NEGATIVE  I-STAT TROPOININ, ED      Result Value Ref Range   Troponin i, poc 0.00  0.00 - 0.08 ng/mL   Comment 3            Ct Abdomen Pelvis W Contrast  10/06/2013   CLINICAL DATA:  Generalized abdominal pain with nausea and vomiting; history of diabetes and gastroc [place] is  EXAM: CT ABDOMEN AND PELVIS WITH CONTRAST  TECHNIQUE: Multidetector CT imaging of the abdomen and pelvis  was performed using the standard protocol following bolus administration of intravenous contrast.  CONTRAST:  OMNIPAQUE IOHEXOL 300 MG/ML SOLN intravenously ; the patient also received oral contrast material.  COMPARISON:  DG ABD 1 VIEW dated 09/04/2013  FINDINGS: The stomach is partially distended with oral contrast. Contrast has reached only the proximal to mid jejunum. There is no evidence of a small or large bowel obstruction. The unopacified remainder of the small bowel as well as the colon exhibit normal stool and gas patterns. There is no pericecal inflammatory change. A normal calibered, uninflamed appearing gas-filled appendix is demonstrated.  The liver exhibits mildly decreased density consistent with fatty infiltration. There is no focal mass or ductal dilation. The gallbladder is adequately distended with no evidence of calcified stones. The pancreas, spleen, and adrenal glands are normal in appearance. The kidneys exhibit no evidence of obstruction. There is a 12 mm diameter hypodensity in the upper pole of the right kidney with Hounsfield measurement of -9 on the immediate postcontrast images and -11 on delayed images. This likely reflects a cyst. The perinephric fat is normal in density. The caliber of the abdominal aorta is  normal.  Within the pelvis the urinary bladder is moderately distended. The prostate gland and seminal vesicles appear normal. There is no inguinal hernia. There is a broadly necked umbilical hernia containing normal small bowel loops.  The lumbar spine and bony pelvis exhibit no acute abnormalities. The lung bases exhibit atelectasis.  IMPRESSION: 1. There is no evidence of bowel obstruction or ileus or acute inflammation. 2. There is no evidence of urinary tract obstruction. The urinary bladder however is moderately distended. If the patient cannot spontaneously void, in and out catheterization may be useful. 3. There is no evidence of acute hepatobiliary abnormality. 4.  There is a broadly necked ventral hernia containing fat and a small bowel loop. There is no evidence of incarceration or inflammation. 5. There is bibasilar atelectasis.   Electronically Signed   By: David  [place]   On: 10/06/2013 00:12   Dg Chest Portable 1 View  09/06/2013   CLINICAL DATA:  Chest pain and hypertension  EXAM: PORTABLE CHEST - 1 VIEW  COMPARISON:  November 07, 2012  FINDINGS: There is no edema or consolidation. Heart is borderline enlarged with normal pulmonary vascularity. No pneumothorax. No adenopathy. No bone lesions.  IMPRESSION: Mild cardiac enlargement.  No edema or consolidation.   Electronically Signed   By: Bretta Bang M.D.   On: 09/06/2013 06:58   Imaging Review No results found.   EKG Interpretation None      MDM   Final diagnoses:  None    1. Abdominal pain  No CT evidence of acute process, labs reassuring. Pain controlled with medications, no vomiting. He has follow up in the community and is encouraged to go for recheck tomorrow, prior to the weekend, with his PCP.    Arnoldo Hooker, PA-C 10/06/13 [phone]

## 2013-10-06 NOTE — Discharge Instructions (Signed)
Abdominal Pain, Adult °Many things can cause abdominal pain. Usually, abdominal pain is not caused by a disease and will improve without treatment. It can often be observed and treated at home. Your health care provider will do a physical exam and possibly order blood tests and X-rays to help determine the seriousness of your pain. However, in many cases, more time must pass before a clear cause of the pain can be found. Before that point, your health care provider may not know if you need more testing or further treatment. °HOME CARE INSTRUCTIONS  °Monitor your abdominal pain for any changes. The following actions may help to alleviate any discomfort you are experiencing: °· Only take over-the-counter or prescription medicines as directed by your health care provider. °· Do not take laxatives unless directed to do so by your health care provider. °· Try a clear liquid diet (broth, tea, or water) as directed by your health care provider. Slowly move to a bland diet as tolerated. °SEEK MEDICAL CARE IF: °· You have unexplained abdominal pain. °· You have abdominal pain associated with nausea or diarrhea. °· You have pain when you urinate or have a bowel movement. °· You experience abdominal pain that wakes you in the night. °· You have abdominal pain that is worsened or improved by eating food. °· You have abdominal pain that is worsened with eating fatty foods. °SEEK IMMEDIATE MEDICAL CARE IF:  °· Your pain does not go away within 2 hours. °· You have a fever. °· You keep throwing up (vomiting). °· Your pain is felt only in portions of the abdomen, such as the right side or the left lower portion of the abdomen. °· You pass bloody or black tarry stools. °MAKE SURE YOU: °· Understand these instructions.   °· Will watch your condition.   °· Will get help right away if you are not doing well or get worse.   °Document Released: 03/04/2005 Document Revised: 03/15/2013 Document Reviewed: 02/01/2013 °[employer]® Patient  Information ©2014 [employer]. ° °

## 2013-10-06 NOTE — ED Notes (Addendum)
Pt comes to ED. States that he was sleeping in lobby after discharged trying to find a ride. States that he hasn't taken his insulin in a couple of days but has it at home. When asked pt why he returned to ED pt does not give clear answer. States that he hasn't had insulin and he is not sure what is wrong with his body. Pt prescriptions in room, has not been filled.

## 2013-10-06 NOTE — ED Notes (Signed)
Pt sleeping when this RN arrived into the room, this RN woke pt up to go over his plan of care, pt awake for a few minutes verbalized understanding of plan of care, pt went right back to sleep, prior to RN leaving pt c/o feeling nauseous and requesting pain medicine, RN asked him where his pain was and handed him a emesis bag, pt fell right back to sleep, pt woke again requesting something to drink, pt came into ED with a ginger ale, pt given his ginger ale, pt did not vomit after drinking approx 4 oz of ginger ale

## 2013-10-06 NOTE — Discharge Instructions (Signed)
Take all home medications as prescribed. Be sure to follow up with your primary care provider as needed for ongoing healthcare needs.

## 2013-10-06 NOTE — ED Provider Notes (Signed)
CSN: [id number]     Arrival date & time 10/06/13  0609 History   First MD Initiated Contact with Patient 10/06/13 [phone]     No chief complaint on file.    (Consider location/radiation/quality/duration/timing/severity/associated sxs/prior Treatment) HPI Pt is a 43yo morbidly obese male with hx of DM, HTN, asthma, gastroenteritis, and constipation was discharged at 01:25 this morning after normal workup for abdominal pain.  Pt states he was sleeping in the ED lobby after discharge, trying to find a ride but checked back into ED c/o abdominal, nausea, and vomiting. Reports 8 episodes of vomiting since yesterday. Pt state he has not taken his insulin in several days but has it at home.  Pt has not left ED since discharge at 01:25, so he has not filled his prescriptions-percocet and zofran. Pt states abdominal pain is constant, aching, and severe.   Past Medical History  Diagnosis Date  . Diabetes mellitus   . Hypertension   . Asthma     childhood  . Neuropathy   . Anxiety   . Depression   . Sickle cell trait   . Sleep apnea   . Gastroenteritis   . Obesity   . Constipation   . Sleep apnea    History reviewed. No pertinent past surgical history. Family History  Problem Relation Age of Onset  . Diabetes Father   . Neuropathy Father   . Hypertension Father   . Sickle cell anemia Mother    History  Substance Use Topics  . Smoking status: Never Smoker   . Smokeless tobacco: Not on file  . Alcohol Use: No    Review of Systems  Constitutional: Negative for fever and chills.  Respiratory: Negative for shortness of breath.   Cardiovascular: Negative for chest pain.  Gastrointestinal: Positive for nausea, vomiting and abdominal pain. Negative for diarrhea.  All other systems reviewed and are negative.     Allergies  Review of patient's allergies indicates no known allergies.  Home Medications   Prior to Admission medications   Medication Sig Start Date End Date Taking?  Authorizing Provider  omeprazole (PRILOSEC) 20 MG capsule Take 1 capsule (20 mg total) by mouth 2 (two) times daily. 10/04/13  Yes Rolland PorterMark James, MD  ondansetron (ZOFRAN) 4 MG tablet Take 1 tablet (4 mg total) by mouth every 6 (six) hours. 10/04/13  Yes Rolland PorterMark James, MD  OxyCODONE (OXYCONTIN) 10 mg T12A 12 hr tablet Take 10 mg by mouth every 12 (twelve) hours.   Yes Historical Provider, MD  insulin aspart (NOVOLOG) 100 UNIT/ML injection Inject 10-20 Units into the skin 3 (three) times daily with meals. per sliding scale CBG 70 - 120: 0 units CBG 121 - 150: 2 units CBG 151 - 200: 3 units CBG 201 - 250: 5 units CBG 251 - 300: 8 units CBG 301 - 350: 11 units CBG 351 - 400: 15 units 11/09/12   Marianne L York, PA-C  insulin detemir (LEVEMIR) 100 UNIT/ML injection Inject 100 Units into the skin at bedtime. Out of medication. Pt does have current refills    Historical Provider, MD  metFORMIN (GLUCOPHAGE) 1000 MG tablet Take 1,000 mg by mouth 2 (two) times daily with a meal.    Historical Provider, MD  pregabalin (LYRICA) 300 MG capsule Take 1 capsule (300 mg total) by mouth 3 (three) times daily. 11/09/12   Tora KindredMarianne L York, PA-C   There were no vitals taken for this visit. Physical Exam  Nursing note and vitals reviewed. Constitutional: He  appears well-developed and well-nourished.  HENT:  Head: Normocephalic and atraumatic.  Eyes: Conjunctivae are normal. No scleral icterus.  Neck: Normal range of motion.  Cardiovascular: Normal rate, regular rhythm and normal heart sounds.   Pulmonary/Chest: Effort normal and breath sounds normal. No respiratory distress. He has no wheezes. He has no rales. He exhibits no tenderness.  Abdominal: Soft. Bowel sounds are normal. He exhibits no distension and no mass. There is no tenderness. There is no rebound and no guarding.  Soft, morbidly obese abdomen, diffuse tenderness.  Musculoskeletal: Normal range of motion.  Neurological: He is alert.  Skin: Skin is warm and  dry.    ED Course  Procedures (including critical care time) Labs Review Labs Reviewed  CBG MONITORING, ED - Abnormal; Notable for the following:    Glucose-Capillary 381 (*)    All other components within normal limits    Imaging Review Ct Abdomen Pelvis W Contrast  10/06/2013   CLINICAL DATA:  Generalized abdominal pain with nausea and vomiting; history of diabetes and gastroc [place] is  EXAM: CT ABDOMEN AND PELVIS WITH CONTRAST  TECHNIQUE: Multidetector CT imaging of the abdomen and pelvis was performed using the standard protocol following bolus administration of intravenous contrast.  CONTRAST:  100mL OMNIPAQUE IOHEXOL 300 MG/ML SOLN intravenously ; the patient also received oral contrast material.  COMPARISON:  DG ABD 1 VIEW dated 09/04/2013  FINDINGS: The stomach is partially distended with oral contrast. Contrast has reached only the proximal to mid jejunum. There is no evidence of a small or large bowel obstruction. The unopacified remainder of the small bowel as well as the colon exhibit normal stool and gas patterns. There is no pericecal inflammatory change. A normal calibered, uninflamed appearing gas-filled appendix is demonstrated.  The liver exhibits mildly decreased density consistent with fatty infiltration. There is no focal mass or ductal dilation. The gallbladder is adequately distended with no evidence of calcified stones. The pancreas, spleen, and adrenal glands are normal in appearance. The kidneys exhibit no evidence of obstruction. There is a 12 mm diameter hypodensity in the upper pole of the right kidney with Hounsfield measurement of -9 on the immediate postcontrast images and -11 on delayed images. This likely reflects a cyst. The perinephric fat is normal in density. The caliber of the abdominal aorta is normal.  Within the pelvis the urinary bladder is moderately distended. The prostate gland and seminal vesicles appear normal. There is no inguinal hernia. There is a  broadly necked umbilical hernia containing normal small bowel loops.  The lumbar spine and bony pelvis exhibit no acute abnormalities. The lung bases exhibit atelectasis.  IMPRESSION: 1. There is no evidence of bowel obstruction or ileus or acute inflammation. 2. There is no evidence of urinary tract obstruction. The urinary bladder however is moderately distended. If the patient cannot spontaneously void, in and out catheterization may be useful. 3. There is no evidence of acute hepatobiliary abnormality. 4. There is a broadly necked ventral hernia containing fat and a small bowel loop. There is no evidence of incarceration or inflammation. 5. There is bibasilar atelectasis.   Electronically Signed   By: David  [place]   On: 10/06/2013 00:12     EKG Interpretation None      MDM   Final diagnoses:  Hyperglycemia  Abdominal pain    Pt discharged at 01:25 AM this morning after negative abdominal pain workup. Pt has not filled percocet and zofran he was discharged with, nor has he taken his insulin  as he has not been home since coming to ED earlier yesterday afternoon.  Reviewed medical records from last 2 days of ED visits.  Today, CBG-381. Not concerned for DKA or other emergent process taking place at this time. Discussed pt with Dr. Oletta Lamas. Will give pt his home dose of insulin and discharge pt.  Advised to f/u with PCP as previously planned.     Junius Finner, PA-C 10/06/13 0730

## 2013-10-06 NOTE — ED Notes (Signed)
Pt discharged home with all belongings, pt alert, oriented, and ambulatory upon discharge. Pt escorted to the waiting area via wheel chair by Inetta Fermoina NT and on duty [employer] officers. Pt refusing to leave and go home, pt requesting a cab voucher, pt informed we do not have cab vouchers, bus pass given, pt then contacted a friend, that said friend instructed pt to call a cab, pt asked if friend was going to pay for cab and pt stated "yes." This RN went back over pt's prescribed medication from yesterday's visit. Pt verbalizes understanding of discharge instructions

## 2013-10-06 NOTE — ED Provider Notes (Signed)
Medical screening examination/treatment/procedure(s) were performed by non-physician practitioner and as supervising physician I was immediately available for consultation/collaboration.   EKG Interpretation None        Gavin PoundMichael Y. Oletta LamasGhim, MD 10/06/13 [phone]

## 2013-11-16 ENCOUNTER — Inpatient Hospital Stay (HOSPITAL_COMMUNITY)
Admission: EM | Admit: 2013-11-16 | Discharge: 2013-11-18 | DRG: 074 | Disposition: A | Discharge status: Home / Self Care | Payer: Medicaid Other | Attending: Internal Medicine | Admitting: Internal Medicine

## 2013-11-16 ENCOUNTER — Encounter (HOSPITAL_COMMUNITY): Payer: Self-pay | Admitting: Emergency Medicine

## 2013-11-16 ENCOUNTER — Emergency Department (HOSPITAL_COMMUNITY): Payer: Medicaid Other

## 2013-11-16 DIAGNOSIS — E1149 Type 2 diabetes mellitus with other diabetic neurological complication: Principal | ICD-10-CM | POA: Diagnosis present

## 2013-11-16 DIAGNOSIS — E669 Obesity, unspecified: Secondary | ICD-10-CM | POA: Diagnosis present

## 2013-11-16 DIAGNOSIS — I10 Essential (primary) hypertension: Secondary | ICD-10-CM | POA: Diagnosis present

## 2013-11-16 DIAGNOSIS — F3289 Other specified depressive episodes: Secondary | ICD-10-CM | POA: Diagnosis present

## 2013-11-16 DIAGNOSIS — Z79899 Other long term (current) drug therapy: Secondary | ICD-10-CM

## 2013-11-16 DIAGNOSIS — F329 Major depressive disorder, single episode, unspecified: Secondary | ICD-10-CM | POA: Diagnosis present

## 2013-11-16 DIAGNOSIS — R112 Nausea with vomiting, unspecified: Secondary | ICD-10-CM

## 2013-11-16 DIAGNOSIS — Z6841 Body Mass Index (BMI) 40.0 and over, adult: Secondary | ICD-10-CM

## 2013-11-16 DIAGNOSIS — Z9989 Dependence on other enabling machines and devices: Secondary | ICD-10-CM

## 2013-11-16 DIAGNOSIS — R9431 Abnormal electrocardiogram [ECG] [EKG]: Secondary | ICD-10-CM

## 2013-11-16 DIAGNOSIS — K3184 Gastroparesis: Secondary | ICD-10-CM

## 2013-11-16 DIAGNOSIS — E1143 Type 2 diabetes mellitus with diabetic autonomic (poly)neuropathy: Secondary | ICD-10-CM

## 2013-11-16 DIAGNOSIS — E119 Type 2 diabetes mellitus without complications: Secondary | ICD-10-CM

## 2013-11-16 DIAGNOSIS — R079 Chest pain, unspecified: Secondary | ICD-10-CM

## 2013-11-16 DIAGNOSIS — R739 Hyperglycemia, unspecified: Secondary | ICD-10-CM

## 2013-11-16 DIAGNOSIS — E0865 Diabetes mellitus due to underlying condition with hyperglycemia: Secondary | ICD-10-CM

## 2013-11-16 DIAGNOSIS — Z794 Long term (current) use of insulin: Secondary | ICD-10-CM

## 2013-11-16 DIAGNOSIS — K5900 Constipation, unspecified: Secondary | ICD-10-CM | POA: Diagnosis present

## 2013-11-16 DIAGNOSIS — G4733 Obstructive sleep apnea (adult) (pediatric): Secondary | ICD-10-CM | POA: Diagnosis present

## 2013-11-16 DIAGNOSIS — R109 Unspecified abdominal pain: Secondary | ICD-10-CM

## 2013-11-16 DIAGNOSIS — F411 Generalized anxiety disorder: Secondary | ICD-10-CM | POA: Diagnosis present

## 2013-11-16 LAB — CBC WITH DIFFERENTIAL/PLATELET
BASOS PCT: 0 % (ref 0–1)
Basophils Absolute: 0 10*3/uL (ref 0.0–0.1)
Eosinophils Absolute: 0 10*3/uL (ref 0.0–0.7)
Eosinophils Relative: 0 % (ref 0–5)
HCT: 45.5 % (ref 39.0–52.0)
Hemoglobin: 15.5 g/dL (ref 13.0–17.0)
LYMPHS ABS: 1.2 10*3/uL (ref 0.7–4.0)
Lymphocytes Relative: 10 % — ABNORMAL LOW (ref 12–46)
MCH: 27.8 pg (ref 26.0–34.0)
MCHC: 34.1 g/dL (ref 30.0–36.0)
MCV: 81.5 fL (ref 78.0–100.0)
Monocytes Absolute: 0.7 10*3/uL (ref 0.1–1.0)
Monocytes Relative: 5 % (ref 3–12)
NEUTROS PCT: 85 % — AB (ref 43–77)
Neutro Abs: 10.3 10*3/uL — ABNORMAL HIGH (ref 1.7–7.7)
PLATELETS: 242 10*3/uL (ref 150–400)
RBC: 5.58 MIL/uL (ref 4.22–5.81)
RDW: 14.3 % (ref 11.5–15.5)
WBC: 12.2 10*3/uL — ABNORMAL HIGH (ref 4.0–10.5)

## 2013-11-16 LAB — COMPREHENSIVE METABOLIC PANEL
ALBUMIN: 3 g/dL — AB (ref 3.5–5.2)
ALK PHOS: 104 U/L (ref 39–117)
ALT: 15 U/L (ref 0–53)
AST: 21 U/L (ref 0–37)
BUN: 10 mg/dL (ref 6–23)
CO2: 23 mEq/L (ref 19–32)
Calcium: 9.6 mg/dL (ref 8.4–10.5)
Chloride: 100 mEq/L (ref 96–112)
Creatinine, Ser: 0.92 mg/dL (ref 0.50–1.35)
GFR calc Af Amer: >90 mL/min (ref >90)
GFR calc non Af Amer: >90 mL/min (ref >90)
GLUCOSE: 180 mg/dL — AB (ref 70–99)
POTASSIUM: 3.5 meq/L — AB (ref 3.7–5.3)
SODIUM: 140 meq/L (ref 137–147)
TOTAL PROTEIN: 8.2 g/dL (ref 6.0–8.3)
Total Bilirubin: 0.3 mg/dL (ref 0.3–1.2)

## 2013-11-16 LAB — URINE MICROSCOPIC-ADD ON

## 2013-11-16 LAB — LIPASE, BLOOD: Lipase: 31 U/L (ref 11–59)

## 2013-11-16 LAB — URINALYSIS, ROUTINE W REFLEX MICROSCOPIC
Bilirubin Urine: NEGATIVE
GLUCOSE, UA: 250 mg/dL — AB
Ketones, ur: 15 mg/dL — AB
Leukocytes, UA: NEGATIVE
Nitrite: NEGATIVE
PH: 7.5 (ref 5.0–8.0)
Protein, ur: >300 mg/dL — AB
Specific Gravity, Urine: 1.015 (ref 1.005–1.030)
Urobilinogen, UA: 1 mg/dL (ref 0.0–1.0)

## 2013-11-16 LAB — TROPONIN I

## 2013-11-16 MED ORDER — MORPHINE SULFATE 4 MG/ML IJ SOLN
4.0000 mg | Freq: Once | INTRAMUSCULAR | Status: AC
  Administered 2013-11-16: 4 mg via INTRAVENOUS
  Filled 2013-11-16: qty 1

## 2013-11-16 MED ORDER — METOCLOPRAMIDE HCL 5 MG/ML IJ SOLN
10.0000 mg | Freq: Once | INTRAMUSCULAR | Status: AC
  Administered 2013-11-17: 10 mg via INTRAVENOUS
  Filled 2013-11-16: qty 2

## 2013-11-16 MED ORDER — MORPHINE SULFATE 4 MG/ML IJ SOLN
4.0000 mg | Freq: Once | INTRAMUSCULAR | Status: AC
Start: 2013-11-16 — End: 2013-11-16
  Administered 2013-11-16: 4 mg via INTRAVENOUS
  Filled 2013-11-16: qty 1

## 2013-11-16 MED ORDER — IOHEXOL 300 MG/ML  SOLN
100.0000 mL | Freq: Once | INTRAMUSCULAR | Status: AC | PRN
  Administered 2013-11-16: 100 mL via INTRAVENOUS

## 2013-11-16 MED ORDER — SODIUM CHLORIDE 0.9 % IV BOLUS (SEPSIS)
1000.0000 mL | Freq: Once | INTRAVENOUS | Status: AC
  Administered 2013-11-16: 1000 mL via INTRAVENOUS

## 2013-11-16 MED ORDER — MORPHINE SULFATE 4 MG/ML IJ SOLN: 4.0000 mg | Freq: Once | INTRAMUSCULAR | Status: DC

## 2013-11-16 MED ORDER — IOHEXOL 300 MG/ML  SOLN: 25.0000 mL | INTRAMUSCULAR | Status: AC

## 2013-11-16 MED ORDER — LABETALOL HCL 5 MG/ML IV SOLN
5.0000 mg | Freq: Once | INTRAVENOUS | Status: AC
  Administered 2013-11-17: 5 mg via INTRAVENOUS
  Filled 2013-11-16: qty 4

## 2013-11-16 NOTE — ED Notes (Signed)
Patient complains of abdominal pain, chest pain for a week, blood in stool off and on for a couple of years, reports blood in vomit.  ems reports tender abdominal and distention.

## 2013-11-16 NOTE — ED Notes (Signed)
Pt family refuses to let pt drink contrast at this time. Wife of pt states that pt is too drowsy to drink the contrast. This Rn explained to pt that he cannot have CT before he finishes contrast. Pt verbalized understanding.

## 2013-11-16 NOTE — ED Provider Notes (Signed)
CSN: [id number]     Arrival date & time 11/16/13  1654 History   First MD Initiated Contact with Patient 11/16/13 1712     Chief Complaint  Patient presents with  . Abdominal Pain   HPI  Kenneth Fox is a 43 y.o. male with a PMH of DM, HTN, asthma, neuropathy, anxiety, depression, sickle cell trait, sleep apnea, constipation and sleep apnea who presents to the ED for evaluation of abdominal pain. History was provided by the patient and his wife. Patient states he has had chronic abdominal pain, nausea and vomiting for "years." Patient states his pain has worsened over the past few days. Patient denies any abdominal pain throughout but has severe nausea. He also has mid-sternal chest pain which started this morning. Chest pain is described as a sharp pain which is worse with vomiting. Patient also complains of a dry cough and SOB. Patient is currently on oxygen which "usually happens when he gets like this." No fever, but has had chills. No bloody stools today but this has not been recent. Has had multiple episodes of emesis and may have had blood in it but he drank red juice prior to this. No dysuria, testicular pain. Patient did not take anything PTA before coming into the ED. Patient has not seen a GI specialist in the past. Patient's PCP is at [facility]. Patient has not taken his BP medications for the past 2 weeks because he ran out.    Past Medical History  Diagnosis Date  . Diabetes mellitus   . Hypertension   . Asthma     childhood  . Neuropathy   . Anxiety   . Depression   . Sickle cell trait   . Sleep apnea   . Gastroenteritis   . Obesity   . Constipation   . Sleep apnea    History reviewed. No pertinent past surgical history. Family History  Problem Relation Age of Onset  . Diabetes Father   . Neuropathy Father   . Hypertension Father   . Sickle cell anemia Mother    History  Substance Use Topics  . Smoking status: Never Smoker   . Smokeless tobacco: Not on  file  . Alcohol Use: No    Review of Systems  Constitutional: Positive for chills and appetite change. Negative for fever, diaphoresis, activity change and fatigue.  HENT: Negative for congestion, rhinorrhea and sore throat.   Respiratory: Positive for cough and shortness of breath. Negative for chest tightness and wheezing.   Cardiovascular: Positive for chest pain. Negative for leg swelling.  Gastrointestinal: Positive for nausea, vomiting and abdominal pain. Negative for diarrhea, constipation and blood in stool.  Genitourinary: Negative for dysuria, urgency, difficulty urinating and testicular pain.  Musculoskeletal: Negative for back pain and myalgias.  Neurological: Negative for dizziness, weakness, light-headedness and headaches.    Allergies  Review of patient's allergies indicates no known allergies.  Home Medications   Prior to Admission medications   Medication Sig Start Date End Date Taking? Authorizing Provider  insulin aspart (NOVOLOG) 100 UNIT/ML injection Inject 10-20 Units into the skin 3 (three) times daily with meals. per sliding scale CBG 70 - 120: 0 units CBG 121 - 150: 2 units CBG 151 - 200: 3 units CBG 201 - 250: 5 units CBG 251 - 300: 8 units CBG 301 - 350: 11 units CBG 351 - 400: 15 units 11/09/12  Yes Marianne L York, PA-C  insulin detemir (LEVEMIR) 100 UNIT/ML injection Inject 100 Units  into the skin at bedtime.    Yes Historical Provider, MD  metFORMIN (GLUCOPHAGE) 1000 MG tablet Take 1,000 mg by mouth 2 (two) times daily with a meal.   Yes Historical Provider, MD  omeprazole (PRILOSEC) 20 MG capsule Take 1 capsule (20 mg total) by mouth 2 (two) times daily. 10/04/13  Yes Rolland Porter, MD  ondansetron (ZOFRAN) 4 MG tablet Take 1 tablet (4 mg total) by mouth every 6 (six) hours. 10/04/13  Yes Rolland Porter, MD  OxyCODONE (OXYCONTIN) 10 mg T12A 12 hr tablet Take 10 mg by mouth every 12 (twelve) hours.   Yes Historical Provider, MD  pregabalin (LYRICA) 300 MG  capsule Take 1 capsule (300 mg total) by mouth 3 (three) times daily. 11/09/12  Yes Tora Kindred York, PA-C  promethazine (PHENERGAN) 25 MG tablet Take 25 mg by mouth every 6 (six) hours as needed for nausea or vomiting.   Yes Historical Provider, MD   BP 176/96  Pulse 77  Temp(Src) 98.6 F (37 C) (Oral)  Resp 22  SpO2 95%  Filed Vitals:   11/17/13 0011 11/17/13 0015 11/17/13 0030 11/17/13 0130  BP: 192/88 190/97 196/90 182/99  Pulse: 88 88 93 96  Temp:      TempSrc:      Resp: 20 16 20 21   SpO2: 95% 96% 96% 98%    Physical Exam  Nursing note and vitals reviewed. Constitutional: He is oriented to person, place, and time. He appears well-developed and well-nourished. No distress.  Patient vomiting   HENT:  Head: Normocephalic and atraumatic.  Right Ear: External ear normal.  Left Ear: External ear normal.  Mouth/Throat: Oropharynx is clear and moist.  Eyes: Conjunctivae are normal. Right eye exhibits no discharge. Left eye exhibits no discharge.  Neck: Normal range of motion. Neck supple.  Cardiovascular: Normal rate, regular rhythm and normal heart sounds.  Exam reveals no gallop and no friction rub.   No murmur heard. Pulmonary/Chest: Effort normal and breath sounds normal. No respiratory distress. He has no wheezes. He has no rales. He exhibits tenderness.  Mid-sternal chest tenderness   Abdominal: Soft. Bowel sounds are normal. He exhibits no distension and no mass. There is tenderness. There is no rebound and no guarding.  Diffuse tenderness. Protuberant abdomen   Musculoskeletal: Normal range of motion. He exhibits edema. He exhibits no tenderness.  Trace pitting equal leg edema bilaterally  Neurological: He is alert and oriented to person, place, and time.  Skin: Skin is warm and dry. He is not diaphoretic.    ED Course  Procedures (including critical care time) Labs Review Labs Reviewed - No data to display  Imaging Review Dg Chest 2 View  11/16/2013   CLINICAL  DATA:  Chest pain.  Vomiting.  Hypertension and diabetes.  EXAM: CHEST  2 VIEW  COMPARISON:  09/06/2013  FINDINGS: The heart size and mediastinal contours are within normal limits. Streaky opacity is seen in the right middle lobe which is new and may represent atelectasis or pneumonia. No evidence pleural effusion.  IMPRESSION: Right middle lobe atelectasis versus pneumonia. Recommend chest radiographic followup in several weeks to confirm resolution.   Electronically Signed   By: Myles Rosenthal M.D.   On: 11/16/2013 19:00   Ct Abdomen Pelvis W Contrast  11/16/2013   CLINICAL DATA:  Abdominal pain  EXAM: CT ABDOMEN AND PELVIS WITH CONTRAST  TECHNIQUE: Multidetector CT imaging of the abdomen and pelvis was performed using the standard protocol following bolus administration of intravenous contrast.  CONTRAST:  100mL OMNIPAQUE IOHEXOL 300 MG/ML  SOLN  COMPARISON:  10/05/2013  FINDINGS: The lung bases again demonstrate bibasilar atelectasis. No focal infiltrate is seen.  The liver is diffusely fatty infiltrated. The spleen, gallbladder, adrenal glands and pancreas are all normal in their CT appearance. The kidneys are well visualized bilaterally and reveal no renal calculi or obstructive changes. Small renal cyst is noted on the right. Small umbilical hernia is noted and stable.  The appendix is well visualized and within normal limits. Bladder is well distended. No pelvic mass lesion or sidewall abnormality is noted. Small retroperitoneal lymph nodes are seen although not significant by size criteria. The osseous structures show postoperative change in the right hemipelvis. No acute bony abnormality is noted.  IMPRESSION: Stable appearance of the abdomen when compared with the prior study. No acute abnormality is seen.   Electronically Signed   By: Alcide CleverMark  Lukens M.D.   On: 11/16/2013 21:36   Dg Abd 2 Views  11/16/2013   CLINICAL DATA:  Abdominal pain  EXAM: ABDOMEN - 2 VIEW  COMPARISON:  None.  FINDINGS: Scattered  large and small bowel gas is noted. Ingested tablet is noted in the right hemi abdomen. Postsurgical changes in the pelvis are seen. No acute bony abnormality is noted. No focal mass lesion is seen.  IMPRESSION: No acute abnormality noted.   Electronically Signed   By: Alcide CleverMark  Lukens M.D.   On: 11/16/2013 19:12     EKG Interpretation None      Results for orders placed during the hospital encounter of 11/16/13  TROPONIN I      Result Value Ref Range   Troponin I <0.30  <0.30 ng/mL  CBC WITH DIFFERENTIAL      Result Value Ref Range   WBC 12.2 (*) 4.0 - 10.5 K/uL   RBC 5.58  4.22 - 5.81 MIL/uL   Hemoglobin 15.5  13.0 - 17.0 g/dL   HCT 16.145.5  09.639.0 - 04.552.0 %   MCV 81.5  78.0 - 100.0 fL   MCH 27.8  26.0 - 34.0 pg   MCHC 34.1  30.0 - 36.0 g/dL   RDW 40.914.3  81.111.5 - 91.415.5 %   Platelets 242  150 - 400 K/uL   Neutrophils Relative % 85 (*) 43 - 77 %   Neutro Abs 10.3 (*) 1.7 - 7.7 K/uL   Lymphocytes Relative 10 (*) 12 - 46 %   Lymphs Abs 1.2  0.7 - 4.0 K/uL   Monocytes Relative 5  3 - 12 %   Monocytes Absolute 0.7  0.1 - 1.0 K/uL   Eosinophils Relative 0  0 - 5 %   Eosinophils Absolute 0.0  0.0 - 0.7 K/uL   Basophils Relative 0  0 - 1 %   Basophils Absolute 0.0  0.0 - 0.1 K/uL  COMPREHENSIVE METABOLIC PANEL      Result Value Ref Range   Sodium 140  137 - 147 mEq/L   Potassium 3.5 (*) 3.7 - 5.3 mEq/L   Chloride 100  96 - 112 mEq/L   CO2 23  19 - 32 mEq/L   Glucose, Bld 180 (*) 70 - 99 mg/dL   BUN 10  6 - 23 mg/dL   Creatinine, Ser 7.820.92  0.50 - 1.35 mg/dL   Calcium 9.6  8.4 - 95.610.5 mg/dL   Total Protein 8.2  6.0 - 8.3 g/dL   Albumin 3.0 (*) 3.5 - 5.2 g/dL   AST 21  0 - 37 U/L  ALT 15  0 - 53 U/L   Alkaline Phosphatase 104  39 - 117 U/L   Total Bilirubin 0.3  0.3 - 1.2 mg/dL   GFR calc non Af Amer >90  >90 mL/min   GFR calc Af Amer >90  >90 mL/min  LIPASE, BLOOD      Result Value Ref Range   Lipase 31  11 - 59 U/L  URINALYSIS, ROUTINE W REFLEX MICROSCOPIC      Result Value Ref Range    Color, Urine YELLOW  YELLOW   APPearance CLEAR  CLEAR   Specific Gravity, Urine 1.015  1.005 - 1.030   pH 7.5  5.0 - 8.0   Glucose, UA 250 (*) NEGATIVE mg/dL   Hgb urine dipstick SMALL (*) NEGATIVE   Bilirubin Urine NEGATIVE  NEGATIVE   Ketones, ur 15 (*) NEGATIVE mg/dL   Protein, ur >161 (*) NEGATIVE mg/dL   Urobilinogen, UA 1.0  0.0 - 1.0 mg/dL   Nitrite NEGATIVE  NEGATIVE   Leukocytes, UA NEGATIVE  NEGATIVE  URINE MICROSCOPIC-ADD ON      Result Value Ref Range   Squamous Epithelial / LPF RARE  RARE   RBC / HPF 3-6  <3 RBC/hpf   Bacteria, UA RARE  RARE   Casts HYALINE CASTS (*) NEGATIVE  I-STAT CG4 LACTIC ACID, ED      Result Value Ref Range   Lactic Acid, Venous 2.06  0.5 - 2.2 mmol/L  CBG MONITORING, ED      Result Value Ref Range   Glucose-Capillary 274 (*) 70 - 99 mg/dL   Comment 1 Documented in Chart     Comment 2 Notify RN       MDM   Kenneth Fox is a 43 y.o. male with a PMH of DM, HTN, asthma, neuropathy, anxiety, depression, sickle cell trait, sleep apnea, constipation and sleep apnea who presents to the ED for evaluation of abdominal pain, nausea and vomiting. Etiology of symptoms possibly due to diabetic gastroparesis. Patient had multiple episodes of emesis throughout his ED visit. Patient admitted for uncontrolled nausea and vomiting. Also likely has some component of dehydration. Lactic acid 2.06. Mild leukocytosis (12.2). CT abdomen and pelvis negative for an acute intraabdominal process. Patient also complained of mid-sternal chest pain which is reproducible on exam. Possibly musculoskeletal from vomiting. Troponin negative. EKG negative for any acute ischemic changes, however, QTc prolonged at 560. Chest x-ray negative for an acute cardiopulmonary process, however, suggestive of a possible atelectasis vs pneumonia. Pneumonia less likely. Patient hypertensive throughout ED visit. Likely due to medication non-compliance. Patient in agreement with admission and  plan.   Rechecks   10:45 PM = Patient continues to vomit and his pain is returning. Ordering another 4 mg morphine  12:00 AM = Ordering Reglan for continued nausea and vomiting  1:00 AM = Patient continues to have nausea but feels a little better. Ok with admission.       Final impressions: 1. Nausea and vomiting   2. Prolonged QT interval   3. Hypertension   4. Diabetes mellitus   5. Chest pain       Luiz Iron PA-C   This patient was discussed with Dr. Skipper Cliche, PA-C 11/17/13 [phone]

## 2013-11-16 NOTE — ED Notes (Signed)
Shanda Bumps, PA notified of pt nausea, pain assessment and elevated BP. Pt normally on lisinopril for BP, denies taking any of his meds today. Awaiting orders.

## 2013-11-17 DIAGNOSIS — E1149 Type 2 diabetes mellitus with other diabetic neurological complication: Principal | ICD-10-CM

## 2013-11-17 DIAGNOSIS — K3184 Gastroparesis: Secondary | ICD-10-CM

## 2013-11-17 DIAGNOSIS — R9431 Abnormal electrocardiogram [ECG] [EKG]: Secondary | ICD-10-CM

## 2013-11-17 DIAGNOSIS — R109 Unspecified abdominal pain: Secondary | ICD-10-CM

## 2013-11-17 DIAGNOSIS — E1143 Type 2 diabetes mellitus with diabetic autonomic (poly)neuropathy: Secondary | ICD-10-CM | POA: Diagnosis present

## 2013-11-17 DIAGNOSIS — R112 Nausea with vomiting, unspecified: Secondary | ICD-10-CM

## 2013-11-17 DIAGNOSIS — E119 Type 2 diabetes mellitus without complications: Secondary | ICD-10-CM

## 2013-11-17 DIAGNOSIS — R7309 Other abnormal glucose: Secondary | ICD-10-CM

## 2013-11-17 DIAGNOSIS — E1369 Other specified diabetes mellitus with other specified complication: Secondary | ICD-10-CM

## 2013-11-17 LAB — BASIC METABOLIC PANEL
BUN: 10 mg/dL (ref 6–23)
CALCIUM: 8.9 mg/dL (ref 8.4–10.5)
CHLORIDE: 100 meq/L (ref 96–112)
CO2: 26 mEq/L (ref 19–32)
CREATININE: 1.17 mg/dL (ref 0.50–1.35)
GFR, EST AFRICAN AMERICAN: 86 mL/min — AB (ref >90)
GFR, EST NON AFRICAN AMERICAN: 74 mL/min — AB (ref >90)
Glucose, Bld: 317 mg/dL — ABNORMAL HIGH (ref 70–99)
Potassium: 4 mEq/L (ref 3.7–5.3)
Sodium: 142 mEq/L (ref 137–147)

## 2013-11-17 LAB — GLUCOSE, CAPILLARY
GLUCOSE-CAPILLARY: 151 mg/dL — AB (ref 70–99)
GLUCOSE-CAPILLARY: 174 mg/dL — AB (ref 70–99)
Glucose-Capillary: 272 mg/dL — ABNORMAL HIGH (ref 70–99)
Glucose-Capillary: 305 mg/dL — ABNORMAL HIGH (ref 70–99)
Glucose-Capillary: 236 mg/dL — ABNORMAL HIGH (ref 70–99)
Glucose-Capillary: 305 mg/dL — ABNORMAL HIGH (ref 70–99)

## 2013-11-17 LAB — CBC
HCT: 41.7 % (ref 39.0–52.0)
Hemoglobin: 13.8 g/dL (ref 13.0–17.0)
MCH: 27.1 pg (ref 26.0–34.0)
MCHC: 33.1 g/dL (ref 30.0–36.0)
MCV: 81.9 fL (ref 78.0–100.0)
PLATELETS: 233 10*3/uL (ref 150–400)
RBC: 5.09 MIL/uL (ref 4.22–5.81)
RDW: 14.6 % (ref 11.5–15.5)
WBC: 14.4 10*3/uL — AB (ref 4.0–10.5)

## 2013-11-17 LAB — CBG MONITORING, ED: Glucose-Capillary: 274 mg/dL — ABNORMAL HIGH (ref 70–99)

## 2013-11-17 LAB — MAGNESIUM: Magnesium: 1.7 mg/dL (ref 1.5–2.5)

## 2013-11-17 LAB — I-STAT CG4 LACTIC ACID, ED: LACTIC ACID, VENOUS: 2.06 mmol/L (ref 0.5–2.2)

## 2013-11-17 MED ORDER — PREGABALIN 100 MG PO CAPS
300.0000 mg | ORAL_CAPSULE | Freq: Three times a day (TID) | ORAL | Status: DC
  Administered 2013-11-17: 300 mg via ORAL
  Filled 2013-11-17: qty 3

## 2013-11-17 MED ORDER — SORBITOL 70 % SOLN
30.0000 mL | Status: AC
  Administered 2013-11-17: 30 mL via ORAL
  Filled 2013-11-17: qty 30

## 2013-11-17 MED ORDER — MAGNESIUM CITRATE PO SOLN
1.0000 | Freq: Once | ORAL | Status: AC
  Administered 2013-11-17: 1 via ORAL
  Filled 2013-11-17: qty 296

## 2013-11-17 MED ORDER — PROMETHAZINE HCL 25 MG/ML IJ SOLN
12.5000 mg | Freq: Four times a day (QID) | INTRAMUSCULAR | Status: DC | PRN
  Administered 2013-11-17: 12.5 mg via INTRAVENOUS
  Filled 2013-11-17: qty 1

## 2013-11-17 MED ORDER — OXYCODONE HCL ER 10 MG PO T12A
10.0000 mg | EXTENDED_RELEASE_TABLET | Freq: Two times a day (BID) | ORAL | Status: DC
  Administered 2013-11-17 – 2013-11-18 (×3): 10 mg via ORAL
  Filled 2013-11-17 (×3): qty 1

## 2013-11-17 MED ORDER — PREGABALIN 75 MG PO CAPS
75.0000 mg | ORAL_CAPSULE | Freq: Three times a day (TID) | ORAL | Status: DC
  Administered 2013-11-18: 75 mg via ORAL
  Filled 2013-11-17 (×2): qty 1

## 2013-11-17 MED ORDER — BISACODYL 10 MG RE SUPP
10.0000 mg | Freq: Once | RECTAL | Status: DC
  Filled 2013-11-17: qty 1

## 2013-11-17 MED ORDER — INSULIN DETEMIR 100 UNIT/ML ~~LOC~~ SOLN
100.0000 [IU] | Freq: Every day | SUBCUTANEOUS | Status: DC
  Filled 2013-11-17: qty 1

## 2013-11-17 MED ORDER — LABETALOL HCL 5 MG/ML IV SOLN
10.0000 mg | INTRAVENOUS | Status: DC | PRN
  Administered 2013-11-17 – 2013-11-18 (×3): 10 mg via INTRAVENOUS
  Filled 2013-11-17 (×4): qty 4

## 2013-11-17 MED ORDER — METOCLOPRAMIDE HCL 5 MG/ML IJ SOLN
10.0000 mg | Freq: Four times a day (QID) | INTRAMUSCULAR | Status: DC
  Administered 2013-11-17 – 2013-11-18 (×6): 10 mg via INTRAVENOUS
  Filled 2013-11-17 (×9): qty 2

## 2013-11-17 MED ORDER — HEPARIN SODIUM (PORCINE) 5000 UNIT/ML IJ SOLN
5000.0000 [IU] | Freq: Three times a day (TID) | INTRAMUSCULAR | Status: DC
  Administered 2013-11-17 – 2013-11-18 (×4): 5000 [IU] via SUBCUTANEOUS
  Filled 2013-11-17 (×7): qty 1

## 2013-11-17 MED ORDER — INSULIN ASPART 100 UNIT/ML ~~LOC~~ SOLN
0.0000 [IU] | SUBCUTANEOUS | Status: DC
  Administered 2013-11-17: 8 [IU] via SUBCUTANEOUS
  Administered 2013-11-17: 3 [IU] via SUBCUTANEOUS
  Administered 2013-11-17: 11 [IU] via SUBCUTANEOUS
  Administered 2013-11-17: 3 [IU] via SUBCUTANEOUS
  Administered 2013-11-17: 5 [IU] via SUBCUTANEOUS
  Administered 2013-11-17: 11 [IU] via SUBCUTANEOUS
  Administered 2013-11-18: 3 [IU] via SUBCUTANEOUS
  Filled 2013-11-17: qty 1

## 2013-11-17 MED ORDER — SODIUM CHLORIDE 0.9 % IV SOLN
INTRAVENOUS | Status: DC
  Administered 2013-11-17 (×2): via INTRAVENOUS

## 2013-11-17 MED ORDER — ONDANSETRON HCL 4 MG PO TABS
4.0000 mg | ORAL_TABLET | Freq: Four times a day (QID) | ORAL | Status: DC
  Administered 2013-11-17 – 2013-11-18 (×4): 4 mg via ORAL
  Filled 2013-11-17 (×10): qty 1

## 2013-11-17 MED ORDER — PANTOPRAZOLE SODIUM 40 MG PO TBEC
40.0000 mg | DELAYED_RELEASE_TABLET | Freq: Every day | ORAL | Status: DC
  Administered 2013-11-18: 40 mg via ORAL
  Filled 2013-11-17: qty 1

## 2013-11-17 MED ORDER — ONDANSETRON HCL 4 MG/2ML IJ SOLN: 4.0000 mg | Freq: Four times a day (QID) | INTRAMUSCULAR | Status: DC | PRN

## 2013-11-17 MED ORDER — INSULIN DETEMIR 100 UNIT/ML ~~LOC~~ SOLN
50.0000 [IU] | Freq: Every day | SUBCUTANEOUS | Status: DC
  Administered 2013-11-17 (×2): 50 [IU] via SUBCUTANEOUS
  Filled 2013-11-17 (×3): qty 0.5

## 2013-11-17 MED ORDER — BIOTENE DRY MOUTH MT LIQD
15.0000 mL | Freq: Two times a day (BID) | OROMUCOSAL | Status: DC
  Administered 2013-11-17 – 2013-11-18 (×3): 15 mL via OROMUCOSAL

## 2013-11-17 MED ORDER — PROMETHAZINE HCL 25 MG PO TABS: 25.0000 mg | ORAL_TABLET | Freq: Four times a day (QID) | ORAL | Status: DC | PRN

## 2013-11-17 MED ORDER — INSULIN DETEMIR 100 UNIT/ML ~~LOC~~ SOLN
50.0000 [IU] | Freq: Every day | SUBCUTANEOUS | Status: DC
Start: 2013-11-17 — End: 2013-11-17

## 2013-11-17 MED ORDER — SODIUM CHLORIDE 0.9 % IJ SOLN
3.0000 mL | Freq: Two times a day (BID) | INTRAMUSCULAR | Status: DC
  Administered 2013-11-17 – 2013-11-18 (×2): 3 mL via INTRAVENOUS

## 2013-11-17 MED ORDER — LUBIPROSTONE 24 MCG PO CAPS
24.0000 ug | ORAL_CAPSULE | Freq: Two times a day (BID) | ORAL | Status: DC
  Administered 2013-11-17 – 2013-11-18 (×2): 24 ug via ORAL
  Filled 2013-11-17 (×4): qty 1

## 2013-11-17 NOTE — Progress Notes (Signed)
Pt admitted to unit by stretcher to [phone]. Pt is alert and oriented x4. In no apparent distress. On 3L of oxygen, pt states that he does not wear 02 at home but is supposed to. "No one ever gave it to me." He also states that he is supposed to wear a CPAP machine at night but does not have one. IV intact, skin intact. Pt states he has a hx of falls, stating that "I get so stressed sometimes that I sleep walk and I've fallen on my face in my kitchen." Bed alarm in place, locked and in lowest position, pt understands how to get into contact with nursing staff and was informed not to try and get out of bed without assistance. Pt BP elevated, stating that he had not taken his blood pressure medicine in two weeks as he has not been able to get it refilled due to not having time. RN will give labetolol dose as soon as it is due and continue to monitor pt per MD orders.

## 2013-11-17 NOTE — ED Provider Notes (Addendum)
Medical screening examination/treatment/procedure(s) were conducted as a shared visit with non-physician practitioner(s) and myself.  I personally evaluated the patient during the encounter.   EKG Interpretation None      Results for orders placed during the hospital encounter of 11/16/13  TROPONIN I      Result Value Ref Range   Troponin I <0.30  <0.30 ng/mL  CBC WITH DIFFERENTIAL      Result Value Ref Range   WBC 12.2 (*) 4.0 - 10.5 K/uL   RBC 5.58  4.22 - 5.81 MIL/uL   Hemoglobin 15.5  13.0 - 17.0 g/dL   HCT 32.445.5  40.139.0 - 02.752.0 %   MCV 81.5  78.0 - 100.0 fL   MCH 27.8  26.0 - 34.0 pg   MCHC 34.1  30.0 - 36.0 g/dL   RDW 25.314.3  66.411.5 - 40.315.5 %   Platelets 242  150 - 400 K/uL   Neutrophils Relative % 85 (*) 43 - 77 %   Neutro Abs 10.3 (*) 1.7 - 7.7 K/uL   Lymphocytes Relative 10 (*) 12 - 46 %   Lymphs Abs 1.2  0.7 - 4.0 K/uL   Monocytes Relative 5  3 - 12 %   Monocytes Absolute 0.7  0.1 - 1.0 K/uL   Eosinophils Relative 0  0 - 5 %   Eosinophils Absolute 0.0  0.0 - 0.7 K/uL   Basophils Relative 0  0 - 1 %   Basophils Absolute 0.0  0.0 - 0.1 K/uL  COMPREHENSIVE METABOLIC PANEL      Result Value Ref Range   Sodium 140  137 - 147 mEq/L   Potassium 3.5 (*) 3.7 - 5.3 mEq/L   Chloride 100  96 - 112 mEq/L   CO2 23  19 - 32 mEq/L   Glucose, Bld 180 (*) 70 - 99 mg/dL   BUN 10  6 - 23 mg/dL   Creatinine, Ser 4.740.92  0.50 - 1.35 mg/dL   Calcium 9.6  8.4 - 25.910.5 mg/dL   Total Protein 8.2  6.0 - 8.3 g/dL   Albumin 3.0 (*) 3.5 - 5.2 g/dL   AST 21  0 - 37 U/L   ALT 15  0 - 53 U/L   Alkaline Phosphatase 104  39 - 117 U/L   Total Bilirubin 0.3  0.3 - 1.2 mg/dL   GFR calc non Af Amer >90  >90 mL/min   GFR calc Af Amer >90  >90 mL/min  LIPASE, BLOOD      Result Value Ref Range   Lipase 31  11 - 59 U/L  URINALYSIS, ROUTINE W REFLEX MICROSCOPIC      Result Value Ref Range   Color, Urine YELLOW  YELLOW   APPearance CLEAR  CLEAR   Specific Gravity, Urine 1.015  1.005 - 1.030   pH 7.5  5.0 -  8.0   Glucose, UA 250 (*) NEGATIVE mg/dL   Hgb urine dipstick SMALL (*) NEGATIVE   Bilirubin Urine NEGATIVE  NEGATIVE   Ketones, ur 15 (*) NEGATIVE mg/dL   Protein, ur >563>300 (*) NEGATIVE mg/dL   Urobilinogen, UA 1.0  0.0 - 1.0 mg/dL   Nitrite NEGATIVE  NEGATIVE   Leukocytes, UA NEGATIVE  NEGATIVE  URINE MICROSCOPIC-ADD ON      Result Value Ref Range   Squamous Epithelial / LPF RARE  RARE   RBC / HPF 3-6  <3 RBC/hpf   Bacteria, UA RARE  RARE   Casts HYALINE CASTS (*) NEGATIVE  Dg Chest 2 View  11/16/2013   CLINICAL DATA:  Chest pain.  Vomiting.  Hypertension and diabetes.  EXAM: CHEST  2 VIEW  COMPARISON:  09/06/2013  FINDINGS: The heart size and mediastinal contours are within normal limits. Streaky opacity is seen in the right middle lobe which is new and may represent atelectasis or pneumonia. No evidence pleural effusion.  IMPRESSION: Right middle lobe atelectasis versus pneumonia. Recommend chest radiographic followup in several weeks to confirm resolution.   Electronically Signed   By: Myles RosenthalJohn  Stahl M.D.   On: 11/16/2013 19:00   Ct Abdomen Pelvis W Contrast  11/16/2013   CLINICAL DATA:  Abdominal pain  EXAM: CT ABDOMEN AND PELVIS WITH CONTRAST  TECHNIQUE: Multidetector CT imaging of the abdomen and pelvis was performed using the standard protocol following bolus administration of intravenous contrast.  CONTRAST:  100mL OMNIPAQUE IOHEXOL 300 MG/ML  SOLN  COMPARISON:  10/05/2013  FINDINGS: The lung bases again demonstrate bibasilar atelectasis. No focal infiltrate is seen.  The liver is diffusely fatty infiltrated. The spleen, gallbladder, adrenal glands and pancreas are all normal in their CT appearance. The kidneys are well visualized bilaterally and reveal no renal calculi or obstructive changes. Small renal cyst is noted on the right. Small umbilical hernia is noted and stable.  The appendix is well visualized and within normal limits. Bladder is well distended. No pelvic mass lesion or  sidewall abnormality is noted. Small retroperitoneal lymph nodes are seen although not significant by size criteria. The osseous structures show postoperative change in the right hemipelvis. No acute bony abnormality is noted.  IMPRESSION: Stable appearance of the abdomen when compared with the prior study. No acute abnormality is seen.   Electronically Signed   By: Alcide CleverMark  Lukens M.D.   On: 11/16/2013 21:36   Dg Abd 2 Views  11/16/2013   CLINICAL DATA:  Abdominal pain  EXAM: ABDOMEN - 2 VIEW  COMPARISON:  None.  FINDINGS: Scattered large and small bowel gas is noted. Ingested tablet is noted in the right hemi abdomen. Postsurgical changes in the pelvis are seen. No acute bony abnormality is noted. No focal mass lesion is seen.  IMPRESSION: No acute abnormality noted.   Electronically Signed   By: Alcide CleverMark  Lukens M.D.   On: 11/16/2013 19:12    Patient with chronic abdominal pain but having persistent vomiting and not reponding to antiemetics. In addition has Prolonged QT. Patient also reporting blood in stool and vomit. Abdominal exam with diffuse tenderness. Patient with hx of hernia but CT negative. Will require admission.   Vanetta MuldersScott Linsy Ehresman, MD 11/17/13 0045  Vanetta MuldersScott Starkeisha Vanwinkle, MD 11/18/13 [phone]

## 2013-11-17 NOTE — Progress Notes (Signed)
Prn for blood pressure placed by Dr. Julian ReilGardner. Verlon AuLeslie, RN notified and stated she would give dose of labetalol before  Transferring pt.

## 2013-11-17 NOTE — Progress Notes (Signed)
Report taken from Cham[place]Leslie, [place] in ED. Pt's BPs elevated without improvement after given labetolol. No prns ordered for when pt is transferred to floor. RN informed Verlon AuLeslie that Dr. Julian ReilGardner would be paged to see if orders will be given. Will call Verlon AuLeslie, RN back.

## 2013-11-17 NOTE — Care Management Note (Signed)
    Page 1 of 1   11/17/2013     4:13:26 PM CARE MANAGEMENT NOTE 11/17/2013  Patient:  Lucky RathkeFISCHER,Paddy E   Account Number:  [id number]  Date Initiated:  11/17/2013  Documentation initiated by:  Letha CapeAYLOR,Avonne Berkery  Subjective/Objective Assessment:   dx chest pain, sleep apnea  admit- lives with spouse.     Action/Plan:   Anticipated DC Date:  11/18/2013   Anticipated DC Plan:  HOME/SELF CARE      DC Planning Services  CM consult      Choice offered to / List presented to:             Status of service:  In process, will continue to follow Medicare Important Message given?   (If response is "NO", the following Medicare IM given date fields will be blank) Date Medicare IM given:   Date Additional Medicare IM given:    Discharge Disposition:    Per UR Regulation:    If discussed at Long Length of Stay Meetings, dates discussed:    Comments:  11/17/13 1542 Letha Capeeborah Cidney Kirkwood RN, BSN [phone] patient states he had a sleep study a year ago, NCM called sleep study scheduler and she stated patient never showed up for his appt.  NCM faxed sleep study referral over and patient now has [facility]  Patient will need a cpap machine, [facility] setting should be between 5-15 and then after sleet study could adjust.  MD stated he was not sure what the settings should be so he states to wait after sleep study to order cpap.  Patient states [place] [employer] and his co pay is $3.

## 2013-11-17 NOTE — Progress Notes (Signed)
[facility] PROGRESS NOTE  Kenneth Fox [id number] DOB: January 12, 1971 DOA: 11/16/2013 PCP: Pcp Not In System  Assessment/Plan: 1. Gastroparesis vs constipation 1. Scheduled reglan ordered 2. Abd xray appears to have stool in ascending and descending colon per my own read 3. Consider trial of cathartic 4. Advance diet as tolerated 2. DM 1. On SSI coverage 3. HTN 4. DVT prophylaxis 1. Heparin subQ 5. OSA 1. Witnessed apneic episodes at the bedside up to 1-1.665min at a time 2. Pt claims "they never gave me the CPAP" 3. Cont with CPAP here and recommend f/u as outpt for OSA tx  Code Status: Full Family Communication: Pt in room Disposition Plan: Pending  Consultants:    Procedures:    Antibiotics:   (indicate start date, and stop date if known)  HPI/Subjective: Hungry. Eager to eat  Objective: Filed Vitals:   11/17/13 0145 11/17/13 0215 11/17/13 0305 11/17/13 0510  BP: 173/96 191/107 166/110 165/99  Pulse: 95 88 94 89  Temp:   98.9 F (37.2 C) 97.9 F (36.6 C)  TempSrc:   Oral Oral  Resp: 16 16 18 18   Height:   5\' 11"  (1.803 m)   Weight:   327 lb 2.6 oz (148.4 kg) 327 lb 2.6 oz (148.4 kg)  SpO2: 97% 95% 97% 99%    Intake/Output Summary (Last 24 hours) at 11/17/13 1239 Last data filed at 11/17/13 1138  Gross per 24 hour  Intake 1562.5 ml  Output    725 ml  Net  837.5 ml   Filed Weights   11/17/13 0305 11/17/13 0510  Weight: 327 lb 2.6 oz (148.4 kg) 327 lb 2.6 oz (148.4 kg)    Exam:   General:  Awake, in nad  Cardiovascular: regular, s1, s2  Respiratory: normal resp effort, no wheezing  Abdomen: soft, obese, nondistended  Musculoskeletal: perfused, no clubbing   Data Reviewed: Basic Metabolic Panel:  Recent Labs Lab 11/16/13 1812 11/17/13 0500  NA 140 142  K 3.5* 4.0  CL 100 100  CO2 23 26  GLUCOSE 180* 317*  BUN 10 10  CREATININE 0.92 1.17  CALCIUM 9.6 8.9  MG  --  1.7   Liver Function Tests:  Recent Labs Lab  11/16/13 1812  AST 21  ALT 15  ALKPHOS 104  BILITOT 0.3  PROT 8.2  ALBUMIN 3.0*    Recent Labs Lab 11/16/13 1812  LIPASE 31   No results found for this basename: AMMONIA,  in the last 168 hours CBC:  Recent Labs Lab 11/16/13 1812 11/17/13 0500  WBC 12.2* 14.4*  NEUTROABS 10.3*  --   HGB 15.5 13.8  HCT 45.5 41.7  MCV 81.5 81.9  PLT 242 233   Cardiac Enzymes:  Recent Labs Lab 11/16/13 1812  TROPONINI <0.30   BNP (last 3 results) No results found for this basename: PROBNP,  in the last 8760 hours CBG:  Recent Labs Lab 11/17/13 0208 11/17/13 0258 11/17/13 0512 11/17/13 0756 11/17/13 1143  GLUCAP 274* 272* 305* 236* 151*    No results found for this or any previous visit (from the past 240 hour(s)).   Studies: Dg Chest 2 View  11/16/2013   CLINICAL DATA:  Chest pain.  Vomiting.  Hypertension and diabetes.  EXAM: CHEST  2 VIEW  COMPARISON:  09/06/2013  FINDINGS: The heart size and mediastinal contours are within normal limits. Streaky opacity is seen in the right middle lobe which is new and may represent atelectasis or pneumonia. No evidence pleural effusion.  IMPRESSION: Right middle lobe atelectasis versus pneumonia. Recommend chest radiographic followup in several weeks to confirm resolution.   Electronically Signed   By: Myles RosenthalJohn  Stahl M.D.   On: 11/16/2013 19:00   Ct Abdomen Pelvis W Contrast  11/16/2013   CLINICAL DATA:  Abdominal pain  EXAM: CT ABDOMEN AND PELVIS WITH CONTRAST  TECHNIQUE: Multidetector CT imaging of the abdomen and pelvis was performed using the standard protocol following bolus administration of intravenous contrast.  CONTRAST:  100mL OMNIPAQUE IOHEXOL 300 MG/ML  SOLN  COMPARISON:  10/05/2013  FINDINGS: The lung bases again demonstrate bibasilar atelectasis. No focal infiltrate is seen.  The liver is diffusely fatty infiltrated. The spleen, gallbladder, adrenal glands and pancreas are all normal in their CT appearance. The kidneys are well  visualized bilaterally and reveal no renal calculi or obstructive changes. Small renal cyst is noted on the right. Small umbilical hernia is noted and stable.  The appendix is well visualized and within normal limits. Bladder is well distended. No pelvic mass lesion or sidewall abnormality is noted. Small retroperitoneal lymph nodes are seen although not significant by size criteria. The osseous structures show postoperative change in the right hemipelvis. No acute bony abnormality is noted.  IMPRESSION: Stable appearance of the abdomen when compared with the prior study. No acute abnormality is seen.   Electronically Signed   By: Alcide CleverMark  Lukens M.D.   On: 11/16/2013 21:36   Dg Abd 2 Views  11/16/2013   CLINICAL DATA:  Abdominal pain  EXAM: ABDOMEN - 2 VIEW  COMPARISON:  None.  FINDINGS: Scattered large and small bowel gas is noted. Ingested tablet is noted in the right hemi abdomen. Postsurgical changes in the pelvis are seen. No acute bony abnormality is noted. No focal mass lesion is seen.  IMPRESSION: No acute abnormality noted.   Electronically Signed   By: Alcide CleverMark  Lukens M.D.   On: 11/16/2013 19:12    Scheduled Meds: . antiseptic oral rinse  15 mL Mouth Rinse BID  . heparin  5,000 Units Subcutaneous 3 times per day  . insulin aspart  0-15 Units Subcutaneous 6 times per day  . insulin detemir  50 Units Subcutaneous QHS  . lubiprostone  24 mcg Oral BID WC  . metoCLOPramide (REGLAN) injection  10 mg Intravenous 4 times per day  . ondansetron  4 mg Oral Q6H  . OxyCODONE  10 mg Oral Q12H  . pantoprazole  40 mg Oral Daily  . pregabalin  300 mg Oral TID  . sodium chloride  3 mL Intravenous Q12H   Continuous Infusions: . sodium chloride 75 mL/hr at 11/17/13 1118    Principal Problem:   Gastroparesis Active Problems:   Diabetes mellitus due to underlying condition with hyperglycemia   Hypertension   Obstructive sleep apnea   Nausea with vomiting   Abdominal pain, unspecified site   Prolonged  Q-T interval on ECG   Diabetic gastroparesis  Time spent: 40min  Sheralyn Pinegar K  [facility] Pager [phone]. If 7PM-7AM, please contact night-coverage at [email or web address], password [email or web address] 11/17/2013, 12:39 PM  LOS: 1 day

## 2013-11-17 NOTE — Progress Notes (Signed)
Pt's Sp02 sats at rest 99% on room air. Pt's Sp02 sats while ambulating in hall 96% on room air. Pt tolerated ambulation well.

## 2013-11-17 NOTE — H&P (Signed)
[facility] History and Physical  Kenneth Fox WUJ:[id number] DOB: 1970/07/11 DOA: 11/16/2013  Referring physician: EDP PCP: Pcp Not In System   Chief Complaint: Abd pain, N/V   HPI: Kenneth Fox is a 43 y.o. male with PMH of poorly controlled DM.  He presents to the ED with abdominal pain.  He has chronic abdominal pain which has worsened over the past couple of days.  Unable to keep any food down.  Work up for this in the past has demonstrated gastroparesis (emptying study done last June showed 85% retention after 2 hours).  He is on no home meds chronically to treat gastroparesis (no reglan etc).  Multiple episodes of emesis in the ED today.  Review of Systems: Systems reviewed.  As above, otherwise negative  Past Medical History  Diagnosis Date  . Diabetes mellitus   . Hypertension   . Asthma     childhood  . Neuropathy   . Anxiety   . Depression   . Sickle cell trait   . Sleep apnea   . Gastroenteritis   . Obesity   . Constipation   . Sleep apnea    History reviewed. No pertinent past surgical history. Social History:  reports that he has never smoked. He does not have any smokeless tobacco history on file. He reports that he does not drink alcohol or use illicit drugs.  No Known Allergies  Family History  Problem Relation Age of Onset  . Diabetes Father   . Neuropathy Father   . Hypertension Father   . Sickle cell anemia Mother      Prior to Admission medications   Medication Sig Start Date End Date Taking? Authorizing Provider  insulin aspart (NOVOLOG) 100 UNIT/ML injection Inject 10-20 Units into the skin 3 (three) times daily with meals. per sliding scale CBG 70 - 120: 0 units CBG 121 - 150: 2 units CBG 151 - 200: 3 units CBG 201 - 250: 5 units CBG 251 - 300: 8 units CBG 301 - 350: 11 units CBG 351 - 400: 15 units 11/09/12  Yes Marianne L York, PA-C  insulin detemir (LEVEMIR) 100 UNIT/ML injection Inject 100 Units into the skin at bedtime.     Yes Historical Provider, MD  metFORMIN (GLUCOPHAGE) 1000 MG tablet Take 1,000 mg by mouth 2 (two) times daily with a meal.   Yes Historical Provider, MD  omeprazole (PRILOSEC) 20 MG capsule Take 1 capsule (20 mg total) by mouth 2 (two) times daily. 10/04/13  Yes Rolland Porter, MD  ondansetron (ZOFRAN) 4 MG tablet Take 1 tablet (4 mg total) by mouth every 6 (six) hours. 10/04/13  Yes Rolland Porter, MD  OxyCODONE (OXYCONTIN) 10 mg T12A 12 hr tablet Take 10 mg by mouth every 12 (twelve) hours.   Yes Historical Provider, MD  pregabalin (LYRICA) 300 MG capsule Take 1 capsule (300 mg total) by mouth 3 (three) times daily. 11/09/12  Yes Tora Kindred York, PA-C  promethazine (PHENERGAN) 25 MG tablet Take 25 mg by mouth every 6 (six) hours as needed for nausea or vomiting.   Yes Historical Provider, MD   Physical Exam: Filed Vitals:   11/17/13 0011  BP: 192/88  Pulse: 88  Temp:   Resp: 20    BP 192/88  Pulse 88  Temp(Src) 98.6 F (37 C) (Oral)  Resp 20  SpO2 95%  General Appearance:    Alert, oriented, no distress, appears stated age  Head:    Normocephalic, atraumatic  Eyes:  PERRL, EOMI, sclera non-icteric        Nose:   Nares without drainage or epistaxis. Mucosa, turbinates normal  Throat:   Moist mucous membranes. Oropharynx without erythema or exudate.  Neck:   Supple. No carotid bruits.  No thyromegaly.  No lymphadenopathy.   Back:     No CVA tenderness, no spinal tenderness  Lungs:     Clear to auscultation bilaterally, without wheezes, rhonchi or rales  Chest wall:    No tenderness to palpitation  Heart:    Regular rate and rhythm without murmurs, gallops, rubs  Abdomen:     Soft, obese with diffuse tenderness especially in epigastric area, normal bowel sounds, no organomegaly  Genitalia:    deferred  Rectal:    deferred  Extremities:   No clubbing, cyanosis or edema.  Pulses:   2+ and symmetric all extremities  Skin:   Skin color, texture, turgor normal, no rashes or lesions  Lymph  nodes:   Cervical, supraclavicular, and axillary nodes normal  Neurologic:   CNII-XII intact. Normal strength, sensation and reflexes      throughout    Labs on Admission:  Basic Metabolic Panel:  Recent Labs Lab 11/16/13 1812  NA 140  K 3.5*  CL 100  CO2 23  GLUCOSE 180*  BUN 10  CREATININE 0.92  CALCIUM 9.6   Liver Function Tests:  Recent Labs Lab 11/16/13 1812  AST 21  ALT 15  ALKPHOS 104  BILITOT 0.3  PROT 8.2  ALBUMIN 3.0*    Recent Labs Lab 11/16/13 1812  LIPASE 31   No results found for this basename: AMMONIA,  in the last 168 hours CBC:  Recent Labs Lab 11/16/13 1812  WBC 12.2*  NEUTROABS 10.3*  HGB 15.5  HCT 45.5  MCV 81.5  PLT 242   Cardiac Enzymes:  Recent Labs Lab 11/16/13 1812  TROPONINI <0.30    BNP (last 3 results) No results found for this basename: PROBNP,  in the last 8760 hours CBG: No results found for this basename: GLUCAP,  in the last 168 hours  Radiological Exams on Admission: Dg Chest 2 View  11/16/2013   CLINICAL DATA:  Chest pain.  Vomiting.  Hypertension and diabetes.  EXAM: CHEST  2 VIEW  COMPARISON:  09/06/2013  FINDINGS: The heart size and mediastinal contours are within normal limits. Streaky opacity is seen in the right middle lobe which is new and may represent atelectasis or pneumonia. No evidence pleural effusion.  IMPRESSION: Right middle lobe atelectasis versus pneumonia. Recommend chest radiographic followup in several weeks to confirm resolution.   Electronically Signed   By: Myles RosenthalJohn  Stahl M.D.   On: 11/16/2013 19:00   Ct Abdomen Pelvis W Contrast  11/16/2013   CLINICAL DATA:  Abdominal pain  EXAM: CT ABDOMEN AND PELVIS WITH CONTRAST  TECHNIQUE: Multidetector CT imaging of the abdomen and pelvis was performed using the standard protocol following bolus administration of intravenous contrast.  CONTRAST:  100mL OMNIPAQUE IOHEXOL 300 MG/ML  SOLN  COMPARISON:  10/05/2013  FINDINGS: The lung bases again demonstrate  bibasilar atelectasis. No focal infiltrate is seen.  The liver is diffusely fatty infiltrated. The spleen, gallbladder, adrenal glands and pancreas are all normal in their CT appearance. The kidneys are well visualized bilaterally and reveal no renal calculi or obstructive changes. Small renal cyst is noted on the right. Small umbilical hernia is noted and stable.  The appendix is well visualized and within normal limits. Bladder is well distended. No  pelvic mass lesion or sidewall abnormality is noted. Small retroperitoneal lymph nodes are seen although not significant by size criteria. The osseous structures show postoperative change in the right hemipelvis. No acute bony abnormality is noted.  IMPRESSION: Stable appearance of the abdomen when compared with the prior study. No acute abnormality is seen.   Electronically Signed   By: Alcide CleverMark  Lukens M.D.   On: 11/16/2013 21:36   Dg Abd 2 Views  11/16/2013   CLINICAL DATA:  Abdominal pain  EXAM: ABDOMEN - 2 VIEW  COMPARISON:  None.  FINDINGS: Scattered large and small bowel gas is noted. Ingested tablet is noted in the right hemi abdomen. Postsurgical changes in the pelvis are seen. No acute bony abnormality is noted. No focal mass lesion is seen.  IMPRESSION: No acute abnormality noted.   Electronically Signed   By: Alcide CleverMark  Lukens M.D.   On: 11/16/2013 19:12    EKG: Independently reviewed.  Assessment/Plan Principal Problem:   Gastroparesis Active Problems:   Diabetes mellitus due to underlying condition with hyperglycemia   Hypertension   Nausea with vomiting   Abdominal pain, unspecified site   Prolonged Q-T interval on ECG   Diabetic gastroparesis   1. Gastroparesis - patient with diabetic gastroparesis diagnosed a year ago, but not being treated with anything at home.  CT scan shows no other findings of other pathology at this time, the remainder of his labs are also unremarkable for other pathology.  Will put patient on scheduled reglan 10mg  Q6H  IV, and keep him NPO.  Will leave on home oxycontin but trying to avoid additional narcotics here if possible (will worsen delay in gastric emptying). 2. DM - continue home levemir, putting patient on med dose SSI Q4H as he is NPO. 3. Prolonged QT interval on today's ECG - slightly prolonged, on EKG today, will keep patient on tele monitor as we are giving reglan to monitor effect on this.  Will also check magnesium with morning lab.    Code Status: Full Code  Family Communication: Family at bedside Disposition Plan: Admit to obs   Time spent: 70 min  GARDNER, JARED M. [facility] Pager [phone]  If 7AM-7PM, please contact the day team taking care of the patient [email or web address] Password [email or web address] 11/17/2013, 1:21 AM

## 2013-11-17 NOTE — Progress Notes (Signed)
UR completed 

## 2013-11-17 NOTE — ED Notes (Signed)
phlebotomy called to collect I-stat

## 2013-11-17 NOTE — ED Notes (Signed)
Pt transported to 5[facility] on a monitor via stretcher by [employer] NT. Report given to April RN on [address] Rm 32

## 2013-11-17 NOTE — Progress Notes (Signed)
Patient stated he only takes 75mg  of Lyrica three times daily. Pt refused 300mg  dose and only wanted 100mg . Md notified and made aware.

## 2013-11-18 LAB — GLUCOSE, CAPILLARY
GLUCOSE-CAPILLARY: 115 mg/dL — AB (ref 70–99)
GLUCOSE-CAPILLARY: 154 mg/dL — AB (ref 70–99)
Glucose-Capillary: 110 mg/dL — ABNORMAL HIGH (ref 70–99)
Glucose-Capillary: 118 mg/dL — ABNORMAL HIGH (ref 70–99)

## 2013-11-18 MED ORDER — POLYETHYLENE GLYCOL 3350 17 G PO PACK: 17.0000 g | PACK | Freq: Every day | ORAL | Status: DC

## 2013-11-18 NOTE — Progress Notes (Signed)
Nsg Discharge Note  Admit Date:  11/16/2013 Discharge date: 11/18/2013   Kenneth Fox to be D/C'd Home per MD order.  AVS completed.  Copy for chart, and copy for patient signed, and dated. Patient/caregiver able to verbalize understanding.  Discharge Medication:   Medication List         insulin aspart 100 UNIT/ML injection  Commonly known as:  novoLOG  - Inject 10-20 Units into the skin 3 (three) times daily with meals. per sliding scale  - CBG 70 - 120: 0 units  - CBG 121 - 150: 2 units  - CBG 151 - 200: 3 units  - CBG 201 - 250: 5 units  - CBG 251 - 300: 8 units  - CBG 301 - 350: 11 units  - CBG 351 - 400: 15 units     insulin detemir 100 UNIT/ML injection  Commonly known as:  LEVEMIR  Inject 100 Units into the skin at bedtime.     metFORMIN 1000 MG tablet  Commonly known as:  GLUCOPHAGE  Take 1,000 mg by mouth 2 (two) times daily with a meal.     omeprazole 20 MG capsule  Commonly known as:  PRILOSEC  Take 1 capsule (20 mg total) by mouth 2 (two) times daily.     ondansetron 4 MG tablet  Commonly known as:  ZOFRAN  Take 1 tablet (4 mg total) by mouth every 6 (six) hours.     OxyCODONE 10 mg T12a 12 hr tablet  Commonly known as:  OXYCONTIN  Take 10 mg by mouth every 12 (twelve) hours.     polyethylene glycol packet  Commonly known as:  MIRALAX  Take 17 g by mouth daily.     pregabalin 300 MG capsule  Commonly known as:  LYRICA  Take 1 capsule (300 mg total) by mouth 3 (three) times daily.     promethazine 25 MG tablet  Commonly known as:  PHENERGAN  Take 25 mg by mouth every 6 (six) hours as needed for nausea or vomiting.        Discharge Assessment: Filed Vitals:   11/18/13 1406  BP: 192/98  Pulse: 96  Temp: 98.5 F (36.9 C)  Resp: 20   Skin clean, dry and intact without evidence of skin break down, no evidence of skin tears noted. IV catheter discontinued intact. Site without signs and symptoms of complications - no redness or edema  noted at insertion site, patient denies c/o pain - only slight tenderness at site.  Dressing with slight pressure applied.  D/c Instructions-Education: Discharge instructions given to patient/family with verbalized understanding. D/c education completed with patient/family including follow up instructions, medication list, d/c activities limitations if indicated, with other d/c instructions as indicated by MD - patient able to verbalize understanding, all questions fully answered. Patient instructed to return to ED, call 911, or call MD for any changes in condition.  Patient escorted via WC, and D/C home via private auto.  Kern ReapBrumagin, [name], RN 11/18/2013 4:18 PM

## 2013-11-18 NOTE — Progress Notes (Signed)
Pt was placed on CPAP with Auto mode. As soon as mask was placed on pt, he wanted the mask to be taken off. He stated he could not tolerate the mask and felt like he could not breath with it. He said he would try again tomorrow night. RT made pt aware that if he changed his mind to call.

## 2013-11-18 NOTE — Discharge Summary (Signed)
Physician Discharge Summary  Kenneth Fox Stalnaker ZOX:[id number] DOB: 06-06-1971 DOA: 11/16/2013  PCP: Pcp Not In System  Admit date: 11/16/2013 Discharge date: 11/18/2013  Time spent: 35 minutes  Recommendations for Outpatient Follow-up:  1. Follow up with PCP in 1-2 weeks 2. Would ensure pt is set up with CPAP as outpatient for hx of OSA  Discharge Diagnoses:  Principal Problem:   Gastroparesis Active Problems:   Diabetes mellitus due to underlying condition with hyperglycemia   Hypertension   Obstructive sleep apnea   Nausea with vomiting   Abdominal pain, unspecified site   Prolonged Q-T interval on ECG   Diabetic gastroparesis   Constipation   Discharge Condition: Improved  Diet recommendation: High fiber, diabetic  Filed Weights   11/17/13 0305 11/17/13 0510  Weight: 327 lb 2.6 oz (148.4 kg) 327 lb 2.6 oz (148.4 kg)    History of present illness:  Kenneth Fox Wehrenberg is a 43 y.o. male with PMH of poorly controlled DM. He presents to the ED with abdominal pain. He has chronic abdominal pain which has worsened over the past couple of days. Unable to keep any food down. Work up for this in the past has demonstrated gastroparesis (emptying study done last June showed 85% retention after 2 hours). He is on no home meds chronically to treat gastroparesis (no reglan etc). Multiple episodes of emesis in the ED today.  Hospital Course:  1. Gastroparesis vs constipation  1. Scheduled reglan was initially ordered 2. Abd xray appears to have stool in ascending and descending colon per my own read 3. Pt improved with cathartics (multiple oral osmotic agents and a trial of Amitiza) 4. Successfully advanced diet as tolerated 5. May consider Amitiza for chronic constipation in the future 6. For now, will prescribe miralax on discharge 2. DM  1. On SSI coverage while in the hospital 3. HTN 4. DVT prophylaxis  1. Heparin subQ while in the hospital 5. OSA  1. Witnessed apneic episodes at  the bedside up to 1-1.375min at a time 2. Pt claims "they never gave me the CPAP" 3. Cont with CPAP here and recommend f/u as outpt for OSA tx  Discharge Exam: Filed Vitals:   11/17/13 2118 11/17/13 2247 11/18/13 0447 11/18/13 1406  BP: 177/116 166/97 115/74 192/98  Pulse: 86 84 103 96  Temp: 98.4 F (36.9 C)  98.4 F (36.9 C) 98.5 F (36.9 C)  TempSrc: Oral  Oral Oral  Resp: 22 22 22 20   Height:      Weight:      SpO2: 92% 95% 94% 95%    General: Awake, in nad Cardiovascular: regular, s1, s2 Respiratory: normal resp effort, no wheezing  Discharge Instructions     Medication List         insulin aspart 100 UNIT/ML injection  Commonly known as:  novoLOG  - Inject 10-20 Units into the skin 3 (three) times daily with meals. per sliding scale  - CBG 70 - 120: 0 units  - CBG 121 - 150: 2 units  - CBG 151 - 200: 3 units  - CBG 201 - 250: 5 units  - CBG 251 - 300: 8 units  - CBG 301 - 350: 11 units  - CBG 351 - 400: 15 units     insulin detemir 100 UNIT/ML injection  Commonly known as:  LEVEMIR  Inject 100 Units into the skin at bedtime.     metFORMIN 1000 MG tablet  Commonly known as:  GLUCOPHAGE  Take 1,000 mg by mouth 2 (two) times daily with a meal.     omeprazole 20 MG capsule  Commonly known as:  PRILOSEC  Take 1 capsule (20 mg total) by mouth 2 (two) times daily.     ondansetron 4 MG tablet  Commonly known as:  ZOFRAN  Take 1 tablet (4 mg total) by mouth every 6 (six) hours.     OxyCODONE 10 mg T12a 12 hr tablet  Commonly known as:  OXYCONTIN  Take 10 mg by mouth every 12 (twelve) hours.     polyethylene glycol packet  Commonly known as:  MIRALAX  Take 17 g by mouth daily.     pregabalin 300 MG capsule  Commonly known as:  LYRICA  Take 1 capsule (300 mg total) by mouth 3 (three) times daily.     promethazine 25 MG tablet  Commonly known as:  PHENERGAN  Take 25 mg by mouth every 6 (six) hours as needed for nausea or vomiting.       No  Known Allergies Follow-up Information   Follow up with [facility] On 01/01/2014. (8 pm, they will mail information to you)    Contact information:   [address] [place] [address] [phone]      Follow up with follow up with your PCP in 1-2 weeks. Schedule an appointment as soon as possible for a visit in 1 week.       The results of significant diagnostics from this hospitalization (including imaging, microbiology, ancillary and laboratory) are listed below for reference.    Significant Diagnostic Studies: Dg Chest 2 View  11/16/2013   CLINICAL DATA:  Chest pain.  Vomiting.  Hypertension and diabetes.  EXAM: CHEST  2 VIEW  COMPARISON:  09/06/2013  FINDINGS: The heart size and mediastinal contours are within normal limits. Streaky opacity is seen in the right middle lobe which is new and may represent atelectasis or pneumonia. No evidence pleural effusion.  IMPRESSION: Right middle lobe atelectasis versus pneumonia. Recommend chest radiographic followup in several weeks to confirm resolution.   Electronically Signed   By: Myles Rosenthal M.D.   On: 11/16/2013 19:00   Ct Abdomen Pelvis W Contrast  11/16/2013   CLINICAL DATA:  Abdominal pain  EXAM: CT ABDOMEN AND PELVIS WITH CONTRAST  TECHNIQUE: Multidetector CT imaging of the abdomen and pelvis was performed using the standard protocol following bolus administration of intravenous contrast.  CONTRAST:  OMNIPAQUE IOHEXOL 300 MG/ML  SOLN  COMPARISON:  10/05/2013  FINDINGS: The lung bases again demonstrate bibasilar atelectasis. No focal infiltrate is seen.  The liver is diffusely fatty infiltrated. The spleen, gallbladder, adrenal glands and pancreas are all normal in their CT appearance. The kidneys are well visualized bilaterally and reveal no renal calculi or obstructive changes. Small renal cyst is noted on the right. Small umbilical hernia is noted and stable.  The appendix is well visualized and within  normal limits. Bladder is well distended. No pelvic mass lesion or sidewall abnormality is noted. Small retroperitoneal lymph nodes are seen although not significant by size criteria. The osseous structures show postoperative change in the right hemipelvis. No acute bony abnormality is noted.  IMPRESSION: Stable appearance of the abdomen when compared with the prior study. No acute abnormality is seen.   Electronically Signed   By: Alcide Clever M.D.   On: 11/16/2013 21:36   Dg Abd 2 Views  11/16/2013   CLINICAL DATA:  Abdominal  pain  EXAM: ABDOMEN - 2 VIEW  COMPARISON:  None.  FINDINGS: Scattered large and small bowel gas is noted. Ingested tablet is noted in the right hemi abdomen. Postsurgical changes in the pelvis are seen. No acute bony abnormality is noted. No focal mass lesion is seen.  IMPRESSION: No acute abnormality noted.   Electronically Signed   By: Alcide CleverMark  Lukens M.D.   On: 11/16/2013 19:12    Microbiology: No results found for this or any previous visit (from the past 240 hour(s)).   Labs: Basic Metabolic Panel:  Recent Labs Lab 11/16/13 1812 11/17/13 0500  NA 140 142  K 3.5* 4.0  CL 100 100  CO2 23 26  GLUCOSE 180* 317*  BUN 10 10  CREATININE 0.92 1.17  CALCIUM 9.6 8.9  MG  --  1.7   Liver Function Tests:  Recent Labs Lab 11/16/13 1812  AST 21  ALT 15  ALKPHOS 104  BILITOT 0.3  PROT 8.2  ALBUMIN 3.0*    Recent Labs Lab 11/16/13 1812  LIPASE 31   No results found for this basename: AMMONIA,  in the last 168 hours CBC:  Recent Labs Lab 11/16/13 1812 11/17/13 0500  WBC 12.2* 14.4*  NEUTROABS 10.3*  --   HGB 15.5 13.8  HCT 45.5 41.7  MCV 81.5 81.9  PLT 242 233   Cardiac Enzymes:  Recent Labs Lab 11/16/13 1812  TROPONINI <0.30   BNP: BNP (last 3 results) No results found for this basename: PROBNP,  in the last 8760 hours CBG:  Recent Labs Lab 11/17/13 2109 11/18/13 0028 11/18/13 0408 11/18/13 0749 11/18/13 1145  GLUCAP 305* 115*  154* 110* 118*    Signed:  Brazil Voytko K  [facility] 11/18/2013, 3:33 PM

## 2013-11-18 NOTE — Discharge Instructions (Signed)
Gastroparesis  Gastroparesis is also called slowed stomach emptying (delayed gastric emptying). It is a condition in which the stomach takes too long to empty its contents. It often happens in people with diabetes.  CAUSES  Gastroparesis happens when nerves to the stomach are damaged or stop working. When the nerves are damaged, the muscles of the stomach and intestines do not work normally. The movement of food is slowed or stopped. High blood glucose (sugar) causes changes in nerves and can damage the blood vessels that carry oxygen and nutrients to the nerves. RISK FACTORS  Diabetes.  Post-viral syndromes.  Eating disorders (anorexia, bulimia).  Surgery on the stomach or vagus nerve.  Gastroesophageal reflux disease (rarely).  Smooth muscle disorders (amyloidosis, scleroderma).  Metabolic disorders, including hypothyroidism.  Parkinson disease. SYMPTOMS   Heartburn.  Feeling sick to your stomach (nausea).  Vomiting of undigested food.  An early feeling of fullness when eating.  Weight loss.  Abdominal bloating.  Erratic blood glucose levels.  Lack of appetite.  Gastroesophageal reflux.  Spasms of the stomach wall. Complications can include:  Bacterial overgrowth in stomach. Food stays in the stomach and can ferment and cause bacteria to grow.  Weight loss due to difficulty digesting and absorbing nutrients.  Vomiting.  Obstruction in the stomach. Undigested food can harden and cause nausea and vomiting.  Blood glucose fluctuations caused by inconsistent food absorption. DIAGNOSIS  The diagnosis of gastroparesis is confirmed through one or more of the following tests:  Barium X-rays and scans. These tests look at how long it takes for food to move through the stomach.  Gastric manometry. This test measures electrical and muscular activity in the stomach. A thin tube is passed down the throat into the stomach. The tube contains a wire that takes measurements  of the stomach's electrical and muscular activity as it digests liquids and solid food.  Endoscopy. This procedure is done with a long, thin tube called an endoscope. It is passed through the mouth and gently down the esophagus into the stomach. This tube helps the caregiver look at the lining of the stomach to check for any abnormalities.  Ultrasonography. This can rule out gallbladder disease or pancreatitis. This test will outline and define the shape of the gallbladder and pancreas. TREATMENT   Treatments may include:  Exercise.  Medicines to control nausea and vomiting.  Medicines to stimulate stomach muscles.  Changes in what and when you eat.  Having smaller meals more often.  Eating low-fiber forms of high-fiber foods, such as eating cooked vegetables instead of raw vegetables.  Eating low-fat foods.  Consuming liquids, which are easier to digest.  In severe cases, feeding tubes and intravenous (IV) feeding may be needed. It is important to note that in most cases, treatment does not cure gastroparesis. It is usually a lasting (chronic) condition. Treatment helps you manage the underlying condition so that you can be as healthy and comfortable as possible. Other treatments  A gastric neurostimulator has been developed to assist people with gastroparesis. The battery-operated device is surgically implanted. It emits mild electrical pulses to help improve stomach emptying and to control nausea and vomiting.  The use of botulinum toxin has been shown to improve stomach emptying by decreasing the prolonged contractions of the muscle between the stomach and the small intestine (pyloric sphincter). The benefits are temporary. SEEK MEDICAL CARE IF:   You have diabetes and you are having problems keeping your blood glucose in goal range.  You are having nausea,   vomiting, bloating, or early feelings of fullness with eating.  Your symptoms do not change with a change in  diet. Document Released: 05/25/2005 Document Revised: 09/19/2012 Document Reviewed: 11/01/2008 Exit[employer] Patient Information 2014 [employer].  

## 2013-11-21 ENCOUNTER — Emergency Department (HOSPITAL_COMMUNITY)
Admission: EM | Admit: 2013-11-21 | Discharge: 2013-11-22 | Disposition: A | Discharge status: Home / Self Care | Payer: Medicaid Other | Attending: Emergency Medicine | Admitting: Emergency Medicine

## 2013-11-21 ENCOUNTER — Emergency Department (HOSPITAL_COMMUNITY): Payer: Medicaid Other

## 2013-11-21 ENCOUNTER — Encounter (HOSPITAL_COMMUNITY): Payer: Self-pay | Admitting: Emergency Medicine

## 2013-11-21 DIAGNOSIS — E119 Type 2 diabetes mellitus without complications: Secondary | ICD-10-CM | POA: Insufficient documentation

## 2013-11-21 DIAGNOSIS — Z794 Long term (current) use of insulin: Secondary | ICD-10-CM | POA: Insufficient documentation

## 2013-11-21 DIAGNOSIS — R109 Unspecified abdominal pain: Secondary | ICD-10-CM

## 2013-11-21 DIAGNOSIS — Z8669 Personal history of other diseases of the nervous system and sense organs: Secondary | ICD-10-CM | POA: Insufficient documentation

## 2013-11-21 DIAGNOSIS — F329 Major depressive disorder, single episode, unspecified: Secondary | ICD-10-CM | POA: Insufficient documentation

## 2013-11-21 DIAGNOSIS — F3289 Other specified depressive episodes: Secondary | ICD-10-CM | POA: Insufficient documentation

## 2013-11-21 DIAGNOSIS — Z862 Personal history of diseases of the blood and blood-forming organs and certain disorders involving the immune mechanism: Secondary | ICD-10-CM | POA: Insufficient documentation

## 2013-11-21 DIAGNOSIS — K5900 Constipation, unspecified: Secondary | ICD-10-CM | POA: Insufficient documentation

## 2013-11-21 DIAGNOSIS — I10 Essential (primary) hypertension: Secondary | ICD-10-CM | POA: Insufficient documentation

## 2013-11-21 DIAGNOSIS — E669 Obesity, unspecified: Secondary | ICD-10-CM | POA: Insufficient documentation

## 2013-11-21 DIAGNOSIS — Z79899 Other long term (current) drug therapy: Secondary | ICD-10-CM | POA: Insufficient documentation

## 2013-11-21 DIAGNOSIS — J45909 Unspecified asthma, uncomplicated: Secondary | ICD-10-CM | POA: Insufficient documentation

## 2013-11-21 DIAGNOSIS — F411 Generalized anxiety disorder: Secondary | ICD-10-CM | POA: Insufficient documentation

## 2013-11-21 LAB — CBG MONITORING, ED
Glucose-Capillary: 359 mg/dL — ABNORMAL HIGH (ref 70–99)
Glucose-Capillary: 363 mg/dL — ABNORMAL HIGH (ref 70–99)

## 2013-11-21 LAB — COMPREHENSIVE METABOLIC PANEL
ALBUMIN: 3.1 g/dL — AB (ref 3.5–5.2)
ALT: 19 U/L (ref 0–53)
AST: 20 U/L (ref 0–37)
Alkaline Phosphatase: 111 U/L (ref 39–117)
BILIRUBIN TOTAL: 0.4 mg/dL (ref 0.3–1.2)
BUN: 7 mg/dL (ref 6–23)
CALCIUM: 9.8 mg/dL (ref 8.4–10.5)
CHLORIDE: 95 meq/L — AB (ref 96–112)
CO2: 29 mEq/L (ref 19–32)
CREATININE: 0.94 mg/dL (ref 0.50–1.35)
GFR calc Af Amer: >90 mL/min (ref >90)
GFR calc non Af Amer: >90 mL/min (ref >90)
Glucose, Bld: 364 mg/dL — ABNORMAL HIGH (ref 70–99)
Potassium: 3.5 mEq/L — ABNORMAL LOW (ref 3.7–5.3)
Sodium: 139 mEq/L (ref 137–147)
Total Protein: 7.9 g/dL (ref 6.0–8.3)

## 2013-11-21 LAB — CBC WITH DIFFERENTIAL/PLATELET
Basophils Absolute: 0 10*3/uL (ref 0.0–0.1)
Basophils Relative: 0 % (ref 0–1)
EOS ABS: 0 10*3/uL (ref 0.0–0.7)
EOS PCT: 0 % (ref 0–5)
HEMATOCRIT: 45.1 % (ref 39.0–52.0)
HEMOGLOBIN: 15.6 g/dL (ref 13.0–17.0)
Lymphocytes Relative: 9 % — ABNORMAL LOW (ref 12–46)
Lymphs Abs: 0.8 10*3/uL (ref 0.7–4.0)
MCH: 28 pg (ref 26.0–34.0)
MCHC: 34.6 g/dL (ref 30.0–36.0)
MCV: 80.8 fL (ref 78.0–100.0)
MONOS PCT: 3 % (ref 3–12)
Monocytes Absolute: 0.3 10*3/uL (ref 0.1–1.0)
Neutro Abs: 8.5 10*3/uL — ABNORMAL HIGH (ref 1.7–7.7)
Neutrophils Relative %: 88 % — ABNORMAL HIGH (ref 43–77)
Platelets: 231 10*3/uL (ref 150–400)
RBC: 5.58 MIL/uL (ref 4.22–5.81)
RDW: 14.3 % (ref 11.5–15.5)
WBC: 9.6 10*3/uL (ref 4.0–10.5)

## 2013-11-21 LAB — LIPASE, BLOOD: LIPASE: 33 U/L (ref 11–59)

## 2013-11-21 LAB — URINALYSIS, ROUTINE W REFLEX MICROSCOPIC
Bilirubin Urine: NEGATIVE
Glucose, UA: >1000 mg/dL — AB
KETONES UR: 40 mg/dL — AB
Leukocytes, UA: NEGATIVE
Nitrite: NEGATIVE
Specific Gravity, Urine: 1.022 (ref 1.005–1.030)
UROBILINOGEN UA: 0.2 mg/dL (ref 0.0–1.0)
pH: 7 (ref 5.0–8.0)

## 2013-11-21 LAB — URINE MICROSCOPIC-ADD ON

## 2013-11-21 LAB — TROPONIN I: Troponin I: <0.3 ng/mL (ref <0.30)

## 2013-11-21 MED ORDER — FENTANYL CITRATE 0.05 MG/ML IJ SOLN
100.0000 ug | Freq: Once | INTRAMUSCULAR | Status: AC
  Administered 2013-11-21: 100 ug via INTRAVENOUS

## 2013-11-21 MED ORDER — ONDANSETRON HCL 4 MG/2ML IJ SOLN
4.0000 mg | Freq: Once | INTRAMUSCULAR | Status: AC
  Administered 2013-11-21: 4 mg via INTRAVENOUS
  Filled 2013-11-21: qty 2

## 2013-11-21 MED ORDER — ONDANSETRON HCL 4 MG/2ML IJ SOLN
4.0000 mg | INTRAMUSCULAR | Status: DC | PRN
  Administered 2013-11-21: 4 mg via INTRAVENOUS
  Filled 2013-11-21: qty 2

## 2013-11-21 MED ORDER — FENTANYL CITRATE 0.05 MG/ML IJ SOLN
100.0000 ug | INTRAMUSCULAR | Status: DC | PRN
  Administered 2013-11-21: 100 ug via INTRAVENOUS
  Filled 2013-11-21: qty 2

## 2013-11-21 MED ORDER — FENTANYL CITRATE 0.05 MG/ML IJ SOLN
50.0000 ug | Freq: Once | INTRAMUSCULAR | Status: DC
  Filled 2013-11-21: qty 2

## 2013-11-21 NOTE — Discharge Instructions (Signed)
Take your MiraLax, twice a day for at least one week. Drink plenty of fluids.   Abdominal Pain, Adult Many things can cause abdominal pain. Usually, abdominal pain is not caused by a disease and will improve without treatment. It can often be observed and treated at home. Your health care provider will do a physical exam and possibly order blood tests and X-rays to help determine the seriousness of your pain. However, in many cases, more time must pass before a clear cause of the pain can be found. Before that point, your health care provider may not know if you need more testing or further treatment. HOME CARE INSTRUCTIONS  Monitor your abdominal pain for any changes. The following actions may help to alleviate any discomfort you are experiencing:  Only take over-the-counter or prescription medicines as directed by your health care provider.  Do not take laxatives unless directed to do so by your health care provider.  Try a clear liquid diet (broth, tea, or water) as directed by your health care provider. Slowly move to a bland diet as tolerated. SEEK MEDICAL CARE IF:  You have unexplained abdominal pain.  You have abdominal pain associated with nausea or diarrhea.  You have pain when you urinate or have a bowel movement.  You experience abdominal pain that wakes you in the night.  You have abdominal pain that is worsened or improved by eating food.  You have abdominal pain that is worsened with eating fatty foods. SEEK IMMEDIATE MEDICAL CARE IF:   Your pain does not go away within 2 hours.  You have a fever.  You keep throwing up (vomiting).  Your pain is felt only in portions of the abdomen, such as the right side or the left lower portion of the abdomen.  You pass bloody or black tarry stools. MAKE SURE YOU:  Understand these instructions.   Will watch your condition.   Will get help right away if you are not doing well or get worse.  Document Released: 03/04/2005  Document Revised: 03/15/2013 Document Reviewed: 02/01/2013 [facility] Patient Information 2014 [place], [place].

## 2013-11-21 NOTE — ED Provider Notes (Signed)
CSN: [id number]     Arrival date & time 11/21/13  1913 History   First MD Initiated Contact with Patient 11/21/13 2129     Chief Complaint  Patient presents with  . Abdominal Pain     (Consider location/radiation/quality/duration/timing/severity/associated sxs/prior Treatment) Patient is a 43 y.o. male presenting with abdominal pain. The history is provided by the patient.  Abdominal Pain  He has persistent, abdominal pain since being discharged, from the hospital 4 days ago. He has had only one small bowel movement since then, and has not eaten much in 7 days. He was recently discharged from the hospital after treatment for gastroparesis. He is reportedly taking his usual medicine, without relief. He has not seen his regular doctor, since discharge from the hospital. He is not checked his blood sugar, at home. There is no reported cough, chest pain, weakness, or dizziness. There are no other known modifying factors.   Past Medical History  Diagnosis Date  . Diabetes mellitus   . Hypertension   . Asthma     childhood  . Neuropathy   . Anxiety   . Depression   . Sickle cell trait   . Sleep apnea   . Gastroenteritis   . Obesity   . Constipation   . Sleep apnea    History reviewed. No pertinent past surgical history. Family History  Problem Relation Age of Onset  . Diabetes Father   . Neuropathy Father   . Hypertension Father   . Sickle cell anemia Mother    History  Substance Use Topics  . Smoking status: Never Smoker   . Smokeless tobacco: Not on file  . Alcohol Use: No    Review of Systems  Gastrointestinal: Positive for abdominal pain.  All other systems reviewed and are negative.     Allergies  Review of patient's allergies indicates no known allergies.  Home Medications   Prior to Admission medications   Medication Sig Start Date End Date Taking? Authorizing Provider  insulin aspart (NOVOLOG) 100 UNIT/ML injection Inject 10-20 Units into the skin 3  (three) times daily with meals. per sliding scale CBG 70 - 120: 0 units CBG 121 - 150: 2 units CBG 151 - 200: 3 units CBG 201 - 250: 5 units CBG 251 - 300: 8 units CBG 301 - 350: 11 units CBG 351 - 400: 15 units 11/09/12  Yes Marianne L York, PA-C  insulin detemir (LEVEMIR) 100 UNIT/ML injection Inject 100 Units into the skin at bedtime.    Yes Historical Provider, MD  metFORMIN (GLUCOPHAGE) 1000 MG tablet Take 1,000 mg by mouth 2 (two) times daily with a meal.   Yes Historical Provider, MD  omeprazole (PRILOSEC) 20 MG capsule Take 1 capsule (20 mg total) by mouth 2 (two) times daily. 10/04/13  Yes Rolland Porter, MD  ondansetron (ZOFRAN) 4 MG tablet Take 1 tablet (4 mg total) by mouth every 6 (six) hours. 10/04/13  Yes Rolland Porter, MD  OxyCODONE (OXYCONTIN) 10 mg T12A 12 hr tablet Take 10 mg by mouth every 12 (twelve) hours.   Yes Historical Provider, MD  polyethylene glycol (MIRALAX) packet Take 17 g by mouth daily. 11/18/13  Yes Jerald Kief, MD  pregabalin (LYRICA) 300 MG capsule Take 1 capsule (300 mg total) by mouth 3 (three) times daily. 11/09/12  Yes Tora Kindred York, PA-C  promethazine (PHENERGAN) 25 MG tablet Take 25 mg by mouth every 6 (six) hours as needed for nausea or vomiting.   Yes Historical Provider,  MD   BP 175/118  Pulse 87  Temp(Src) 97.7 F (36.5 C) (Oral)  Resp 14  SpO2 98% Physical Exam  Nursing note and vitals reviewed. Constitutional: He is oriented to person, place, and time. He appears well-developed. He appears distressed (Groaning with pain, defers answers to questions, to his wife.).  Obese  HENT:  Head: Normocephalic and atraumatic.  Right Ear: External ear normal.  Left Ear: External ear normal.  Eyes: Conjunctivae and EOM are normal. Pupils are equal, round, and reactive to light.  Neck: Normal range of motion and phonation normal. Neck supple.  Cardiovascular: Normal rate, regular rhythm, normal heart sounds and intact distal pulses.   Pulmonary/Chest: Effort  normal and breath sounds normal. He exhibits no bony tenderness.  Abdominal: Soft. He exhibits distension. There is tenderness (Diffuse, moderate). There is guarding (Diffuse). There is no rebound.  Musculoskeletal: Normal range of motion.  Neurological: He is alert and oriented to person, place, and time. No cranial nerve deficit or sensory deficit. He exhibits normal muscle tone. Coordination normal.  Skin: Skin is warm, dry and intact.  Psychiatric: He has a normal mood and affect. His behavior is normal.    ED Course  Procedures (including critical care time) Medications  fentaNYL (SUBLIMAZE) injection 100 mcg (100 mcg Intravenous Given 11/21/13 2257)  ondansetron (ZOFRAN) injection 4 mg (4 mg Intravenous Given 11/21/13 2257)  ondansetron (ZOFRAN) injection 4 mg (4 mg Intravenous Given 11/21/13 2136)  fentaNYL (SUBLIMAZE) injection 100 mcg (100 mcg Intravenous Given 11/21/13 2136)    Patient Vitals for the past 24 hrs:  BP Temp Temp src Pulse Resp SpO2  11/21/13 2330 175/118 mmHg - - 87 14 98 %  11/21/13 2200 188/108 mmHg - - 84 11 90 %  11/21/13 2100 175/92 mmHg - - 70 - 98 %  11/21/13 2030 169/83 mmHg - - 85 - 98 %  11/21/13 2000 175/85 mmHg - - 84 - 74 %  11/21/13 1928 174/106 mmHg 97.7 F (36.5 C) Oral 87 22 98 %    10:12 PM Reevaluation with update and discussion. After initial assessment and treatment, an updated evaluation reveals he had oxygen desaturation after initial treatment with fentanyl. At this time. The oxygen saturation is 96% on nasal cannula oxygen at 6 L. The patient is alert and conversant, he, states that he feels, "like crap.". WENTZ,ELLIOTT L   23:55, he is comfortable at this time. He does not have any further abdominal pain. He understands that he needs to take MiraLax more frequently, he only took it once after hospital discharge, when he was told to take it daily for 14 days. We talked about taking it as frequent as twice a day to improve the  stooling.  Labs Review Labs Reviewed  CBC WITH DIFFERENTIAL - Abnormal; Notable for the following:    Neutrophils Relative % 88 (*)    Neutro Abs 8.5 (*)    Lymphocytes Relative 9 (*)    All other components within normal limits  COMPREHENSIVE METABOLIC PANEL - Abnormal; Notable for the following:    Potassium 3.5 (*)    Chloride 95 (*)    Glucose, Bld 364 (*)    Albumin 3.1 (*)    All other components within normal limits  URINALYSIS, ROUTINE W REFLEX MICROSCOPIC - Abnormal; Notable for the following:    Glucose, UA >1000 (*)    Hgb urine dipstick SMALL (*)    Ketones, ur 40 (*)    Protein, ur >300 (*)  All other components within normal limits  CBG MONITORING, ED - Abnormal; Notable for the following:    Glucose-Capillary 359 (*)    All other components within normal limits  CBG MONITORING, ED - Abnormal; Notable for the following:    Glucose-Capillary 363 (*)    All other components within normal limits  LIPASE, BLOOD  TROPONIN I  URINE MICROSCOPIC-ADD ON    Imaging Review Dg Abd Acute W/chest  11/21/2013   CLINICAL DATA:  Upper abdominal pain.  EXAM: ACUTE ABDOMEN SERIES (ABDOMEN 2 VIEW & CHEST 1 VIEW)  COMPARISON:  CT of the abdomen pelvis November 16, 2013, chest radiograph November 16, 2013  FINDINGS: Cardiac silhouette appears mildly enlarged, mediastinal silhouette is nonsuspicious. Bibasilar strandy densities. No pleural effusions. No pneumothorax. Large body habitus. Osseous structures are nonsuspicious.  Bowel gas pattern is nondilated and nonobstructive. No intra-abdominal mass effect or pathologic calcifications ; phleboliths in the right pelvis. No intraperitoneal free air. Status post right acetabular OR by CT.  IMPRESSION: Mild cardiomegaly, bibasilar strandy densities favor atelectasis.  Nonspecific bowel gas pattern.   Electronically Signed   By: Awilda Metroourtnay  Bloomer   On: 11/21/2013 22:55     EKG Interpretation None      MDM   Final diagnoses:  Abdominal pain   Constipation    Abdominal pain with constipation. No evidence for bowel obstruction. Metabolic instability, serious bacterial infection or impending vascular collapse.  Nursing Notes Reviewed/ Care Coordinated Applicable Imaging Reviewed Interpretation of Laboratory Data incorporated into ED treatment  The patient appears reasonably screened and/or stabilized for discharge and I doubt any other medical condition or other [facility] requiring further screening, evaluation, or treatment in the ED at this time prior to discharge.  Plan: Home Medications- usual; Home Treatments- rest, fluids; return here if the recommended treatment, does not improve the symptoms; Recommended follow up- PCP 3-5 days   Flint MelterElliott L Wentz, MD 11/22/13 0009

## 2013-11-21 NOTE — ED Notes (Signed)
Patient transported to X-ray 

## 2013-11-21 NOTE — ED Notes (Addendum)
Pt. arrived with EMS from home reports LUQ pain for several days with nausea and vomitting today , received Zofran 4 mg IV by EMS , CBG = 315 , seen here and admitted last 11/16/2013 diagnosed with nausea/vomitting .

## 2013-11-21 NOTE — ED Notes (Signed)
MD at bedside. 

## 2013-11-24 ENCOUNTER — Emergency Department (HOSPITAL_COMMUNITY): Payer: Medicaid Other

## 2013-11-24 ENCOUNTER — Inpatient Hospital Stay (HOSPITAL_COMMUNITY)
Admission: EM | Admit: 2013-11-24 | Discharge: 2013-11-26 | DRG: 638 | Disposition: A | Discharge status: Home / Self Care | Payer: Medicaid Other | Attending: Internal Medicine | Admitting: Internal Medicine

## 2013-11-24 ENCOUNTER — Encounter (HOSPITAL_COMMUNITY): Payer: Self-pay | Admitting: Emergency Medicine

## 2013-11-24 DIAGNOSIS — R1110 Vomiting, unspecified: Secondary | ICD-10-CM

## 2013-11-24 DIAGNOSIS — E669 Obesity, unspecified: Secondary | ICD-10-CM

## 2013-11-24 DIAGNOSIS — I10 Essential (primary) hypertension: Secondary | ICD-10-CM | POA: Diagnosis present

## 2013-11-24 DIAGNOSIS — E1310 Other specified diabetes mellitus with ketoacidosis without coma: Principal | ICD-10-CM

## 2013-11-24 DIAGNOSIS — R739 Hyperglycemia, unspecified: Secondary | ICD-10-CM

## 2013-11-24 DIAGNOSIS — K3184 Gastroparesis: Secondary | ICD-10-CM

## 2013-11-24 DIAGNOSIS — E1143 Type 2 diabetes mellitus with diabetic autonomic (poly)neuropathy: Secondary | ICD-10-CM | POA: Diagnosis present

## 2013-11-24 DIAGNOSIS — Z9989 Dependence on other enabling machines and devices: Secondary | ICD-10-CM

## 2013-11-24 DIAGNOSIS — R7309 Other abnormal glucose: Secondary | ICD-10-CM

## 2013-11-24 DIAGNOSIS — R197 Diarrhea, unspecified: Secondary | ICD-10-CM

## 2013-11-24 DIAGNOSIS — Z794 Long term (current) use of insulin: Secondary | ICD-10-CM

## 2013-11-24 DIAGNOSIS — E0865 Diabetes mellitus due to underlying condition with hyperglycemia: Secondary | ICD-10-CM

## 2013-11-24 DIAGNOSIS — G4733 Obstructive sleep apnea (adult) (pediatric): Secondary | ICD-10-CM | POA: Diagnosis present

## 2013-11-24 DIAGNOSIS — K5900 Constipation, unspecified: Secondary | ICD-10-CM

## 2013-11-24 DIAGNOSIS — T40605A Adverse effect of unspecified narcotics, initial encounter: Secondary | ICD-10-CM | POA: Diagnosis present

## 2013-11-24 DIAGNOSIS — R109 Unspecified abdominal pain: Secondary | ICD-10-CM

## 2013-11-24 DIAGNOSIS — R9431 Abnormal electrocardiogram [ECG] [EKG]: Secondary | ICD-10-CM

## 2013-11-24 DIAGNOSIS — E1149 Type 2 diabetes mellitus with other diabetic neurological complication: Secondary | ICD-10-CM

## 2013-11-24 DIAGNOSIS — K5901 Slow transit constipation: Secondary | ICD-10-CM

## 2013-11-24 DIAGNOSIS — K5909 Other constipation: Secondary | ICD-10-CM | POA: Diagnosis present

## 2013-11-24 DIAGNOSIS — E876 Hypokalemia: Secondary | ICD-10-CM

## 2013-11-24 DIAGNOSIS — IMO0001 Reserved for inherently not codable concepts without codable children: Secondary | ICD-10-CM | POA: Diagnosis present

## 2013-11-24 DIAGNOSIS — E1142 Type 2 diabetes mellitus with diabetic polyneuropathy: Secondary | ICD-10-CM | POA: Diagnosis present

## 2013-11-24 DIAGNOSIS — R112 Nausea with vomiting, unspecified: Secondary | ICD-10-CM

## 2013-11-24 DIAGNOSIS — K219 Gastro-esophageal reflux disease without esophagitis: Secondary | ICD-10-CM | POA: Diagnosis present

## 2013-11-24 DIAGNOSIS — Z6841 Body Mass Index (BMI) 40.0 and over, adult: Secondary | ICD-10-CM

## 2013-11-24 HISTORY — DX: Gastro-esophageal reflux disease without esophagitis: K21.9

## 2013-11-24 LAB — URINALYSIS, ROUTINE W REFLEX MICROSCOPIC
BILIRUBIN URINE: NEGATIVE
GLUCOSE, UA: 500 mg/dL — AB
Ketones, ur: 15 mg/dL — AB
Leukocytes, UA: NEGATIVE
NITRITE: NEGATIVE
PH: 7 (ref 5.0–8.0)
Protein, ur: >300 mg/dL — AB
Specific Gravity, Urine: 1.014 (ref 1.005–1.030)
Urobilinogen, UA: 1 mg/dL (ref 0.0–1.0)

## 2013-11-24 LAB — CBC WITH DIFFERENTIAL/PLATELET
BASOS PCT: 0 % (ref 0–1)
Basophils Absolute: 0 10*3/uL (ref 0.0–0.1)
Eosinophils Absolute: 0 10*3/uL (ref 0.0–0.7)
Eosinophils Relative: 0 % (ref 0–5)
HCT: 43.9 % (ref 39.0–52.0)
HEMOGLOBIN: 15.2 g/dL (ref 13.0–17.0)
LYMPHS ABS: 1.5 10*3/uL (ref 0.7–4.0)
LYMPHS PCT: 16 % (ref 12–46)
MCH: 27.5 pg (ref 26.0–34.0)
MCHC: 34.6 g/dL (ref 30.0–36.0)
MCV: 79.4 fL (ref 78.0–100.0)
MONOS PCT: 9 % (ref 3–12)
Monocytes Absolute: 0.8 10*3/uL (ref 0.1–1.0)
NEUTROS PCT: 75 % (ref 43–77)
Neutro Abs: 6.7 10*3/uL (ref 1.7–7.7)
Platelets: 247 10*3/uL (ref 150–400)
RBC: 5.53 MIL/uL (ref 4.22–5.81)
RDW: 14.4 % (ref 11.5–15.5)
WBC: 9 10*3/uL (ref 4.0–10.5)

## 2013-11-24 LAB — URINE MICROSCOPIC-ADD ON

## 2013-11-24 LAB — COMPREHENSIVE METABOLIC PANEL
ALBUMIN: 3.1 g/dL — AB (ref 3.5–5.2)
ALT: 14 U/L (ref 0–53)
AST: 14 U/L (ref 0–37)
Alkaline Phosphatase: 98 U/L (ref 39–117)
BUN: 10 mg/dL (ref 6–23)
CO2: 27 mEq/L (ref 19–32)
Calcium: 9.8 mg/dL (ref 8.4–10.5)
Chloride: 92 mEq/L — ABNORMAL LOW (ref 96–112)
Creatinine, Ser: 1.26 mg/dL (ref 0.50–1.35)
GFR calc Af Amer: 79 mL/min — ABNORMAL LOW (ref >90)
GFR calc non Af Amer: 68 mL/min — ABNORMAL LOW (ref >90)
GLUCOSE: 312 mg/dL — AB (ref 70–99)
POTASSIUM: 3.2 meq/L — AB (ref 3.7–5.3)
Sodium: 136 mEq/L — ABNORMAL LOW (ref 137–147)
Total Bilirubin: 0.4 mg/dL (ref 0.3–1.2)
Total Protein: 7.4 g/dL (ref 6.0–8.3)

## 2013-11-24 LAB — TROPONIN I: Troponin I: <0.3 ng/mL (ref <0.30)

## 2013-11-24 LAB — PRO B NATRIURETIC PEPTIDE: PRO B NATRI PEPTIDE: 72.7 pg/mL (ref 0–125)

## 2013-11-24 LAB — D-DIMER, QUANTITATIVE: D-Dimer, Quant: <0.27 ug/mL-FEU (ref 0.00–0.48)

## 2013-11-24 LAB — LIPASE, BLOOD: LIPASE: 47 U/L (ref 11–59)

## 2013-11-24 MED ORDER — SODIUM CHLORIDE 0.9 % IV BOLUS (SEPSIS)
250.0000 mL | Freq: Once | INTRAVENOUS | Status: AC
  Administered 2013-11-24: 250 mL via INTRAVENOUS

## 2013-11-24 MED ORDER — ONDANSETRON HCL 4 MG/2ML IJ SOLN
4.0000 mg | Freq: Once | INTRAMUSCULAR | Status: AC
  Administered 2013-11-24: 4 mg via INTRAVENOUS
  Filled 2013-11-24 (×2): qty 2

## 2013-11-24 MED ORDER — SODIUM CHLORIDE 0.9 % IV SOLN: Freq: Once | INTRAVENOUS | Status: DC

## 2013-11-24 MED ORDER — MORPHINE SULFATE 2 MG/ML IJ SOLN
2.0000 mg | Freq: Once | INTRAMUSCULAR | Status: AC
Start: 2013-11-24 — End: 2013-11-24
  Administered 2013-11-24: 2 mg via INTRAVENOUS
  Filled 2013-11-24: qty 1

## 2013-11-24 MED ORDER — INSULIN ASPART 100 UNIT/ML ~~LOC~~ SOLN
5.0000 [IU] | Freq: Once | SUBCUTANEOUS | Status: AC
  Administered 2013-11-24: 5 [IU] via SUBCUTANEOUS
  Filled 2013-11-24: qty 1

## 2013-11-24 MED ORDER — ONDANSETRON 4 MG PO TBDP
4.0000 mg | ORAL_TABLET | ORAL | Status: AC
  Administered 2013-11-24: 4 mg via ORAL
  Filled 2013-11-24: qty 1

## 2013-11-24 NOTE — ED Notes (Signed)
Pt states his stomach hurt

## 2013-11-24 NOTE — ED Notes (Signed)
Attempted IV insertion x 2, unsuccessful.

## 2013-11-24 NOTE — ED Notes (Signed)
Patient transported to X-ray 

## 2013-11-24 NOTE — ED Provider Notes (Signed)
CSN: [id number]     Arrival date & time 11/24/13  1816 History   First MD Initiated Contact with Patient 11/24/13 1853     Chief Complaint  Patient presents with  . Abdominal Pain     (Consider location/radiation/quality/duration/timing/severity/associated sxs/prior Treatment) Patient is a 43 y.o. male presenting with abdominal pain. The history is provided by the patient. No language interpreter was used.  Abdominal Pain Associated symptoms: chest pain, constipation, nausea, shortness of breath and vomiting   Associated symptoms: no chills and no fever   Kenneth Fox is a 43 y/o F with PMHx of DM, sleep apnea, asthma, neuropathy, sickle cell trait, gastroenteritis, obesity, constipation presenting to the ED with abdominal pain that has been ongoing ever since the patient was admitted to the hospital on 11/16/2013. Patient reported that the abdominal pain goes from the top of his stomach to the bottom of his stomach described as if someone is stabbing him. Patient reported that he has been feeling nauseous "all day every day" with episodes of emesis that he "came in count"-reported approximately 7 episodes today. Stated that there is no radiation of abdominal pain towards the back. Reported chest discomfort described as "gas bubbles" that is pleuritic with associated shortness of breath and difficulty breathing. Stated that he's been unable to keep any food or fluids down. Stated that he has had 2 bowel movements today. Reported that he was seen and assessed by his primary care provider today who recommended patient to come to the ED for further workup to be performed. Denied fever, chills, melena, hematochezia, hematuria, dysuria. PCP none  Past Medical History  Diagnosis Date  . Diabetes mellitus   . Hypertension   . Asthma     childhood  . Neuropathy   . Anxiety   . Depression   . Sickle cell trait   . Sleep apnea   . Gastroenteritis   . Obesity   . Constipation   . Sleep apnea     History reviewed. No pertinent past surgical history. Family History  Problem Relation Age of Onset  . Diabetes Father   . Neuropathy Father   . Hypertension Father   . Sickle cell anemia Mother    History  Substance Use Topics  . Smoking status: Never Smoker   . Smokeless tobacco: Not on file  . Alcohol Use: No    Review of Systems  Constitutional: Negative for fever and chills.  Respiratory: Positive for shortness of breath. Negative for chest tightness.   Cardiovascular: Positive for chest pain.  Gastrointestinal: Positive for nausea, vomiting, abdominal pain and constipation. Negative for blood in stool and anal bleeding.  Neurological: Negative for weakness.      Allergies  Review of patient's allergies indicates no known allergies.  Home Medications   Prior to Admission medications   Medication Sig Start Date End Date Taking? Authorizing Provider  insulin aspart (NOVOLOG) 100 UNIT/ML injection Inject 10-20 Units into the skin 3 (three) times daily with meals. per sliding scale CBG 70 - 120: 0 units CBG 121 - 150: 2 units CBG 151 - 200: 3 units CBG 201 - 250: 5 units CBG 251 - 300: 8 units CBG 301 - 350: 11 units CBG 351 - 400: 15 units 11/09/12   Marianne L York, PA-C  insulin detemir (LEVEMIR) 100 UNIT/ML injection Inject 100 Units into the skin at bedtime.     Historical Provider, MD  metFORMIN (GLUCOPHAGE) 1000 MG tablet Take 1,000 mg by mouth 2 (two)  times daily with a meal.    Historical Provider, MD  omeprazole (PRILOSEC) 20 MG capsule Take 1 capsule (20 mg total) by mouth 2 (two) times daily. 10/04/13   Rolland PorterMark James, MD  ondansetron (ZOFRAN) 4 MG tablet Take 1 tablet (4 mg total) by mouth every 6 (six) hours. 10/04/13   Rolland PorterMark James, MD  OxyCODONE (OXYCONTIN) 10 mg T12A 12 hr tablet Take 10 mg by mouth every 12 (twelve) hours.    Historical Provider, MD  polyethylene glycol (MIRALAX) packet Take 17 g by mouth daily. 11/18/13   Jerald KiefStephen K Chiu, MD  pregabalin  (LYRICA) 300 MG capsule Take 1 capsule (300 mg total) by mouth 3 (three) times daily. 11/09/12   Stephani PoliceMarianne L York, PA-C  promethazine (PHENERGAN) 25 MG tablet Take 25 mg by mouth every 6 (six) hours as needed for nausea or vomiting.    Historical Provider, MD   BP 175/87  Pulse 77  Temp(Src) 98.1 F (36.7 C) (Oral)  Resp 14  SpO2 100% Physical Exam  Nursing note and vitals reviewed. Constitutional: He is oriented to person, place, and time. He appears well-developed and well-nourished. No distress.  HENT:  Head: Normocephalic and atraumatic.  Mouth/Throat: Oropharynx is clear and moist. No oropharyngeal exudate.  Dry mucus membranes noted  Eyes: Conjunctivae and EOM are normal. Pupils are equal, round, and reactive to light. Right eye exhibits no discharge. Left eye exhibits no discharge.  Neck: Normal range of motion. Neck supple. No tracheal deviation present.  Cardiovascular: Regular rhythm and normal heart sounds.   Tachycardic upon auscultation  Negative swelling or pitting edema noted to the lower extremities bilaterally   Abdominal: Soft. Bowel sounds are normal. He exhibits distension. There is tenderness. There is no rebound and no guarding.  Obese Tenderness upon palpation to the abdomen - diffuse Negative rigidity upon palpation   Musculoskeletal: Normal range of motion.  Full ROM to upper and lower extremities without difficulty noted, negative ataxia noted.  Lymphadenopathy:    He has no cervical adenopathy.  Neurological: He is alert and oriented to person, place, and time. No cranial nerve deficit. He exhibits normal muscle tone. Coordination normal.  Cranial nerves III-XII grossly intact  Skin: Skin is warm and dry. No rash noted. He is not diaphoretic. No erythema.  Psychiatric: He has a normal mood and affect. His behavior is normal. Thought content normal.    ED Course  Procedures (including critical care time) Labs Review Labs Reviewed  COMPREHENSIVE METABOLIC  PANEL - Abnormal; Notable for the following:    Sodium 136 (*)    Potassium 3.2 (*)    Chloride 92 (*)    Glucose, Bld 312 (*)    Albumin 3.1 (*)    GFR calc non Af Amer 68 (*)    GFR calc Af Amer 79 (*)    All other components within normal limits  CBC WITH DIFFERENTIAL  LIPASE, BLOOD  TROPONIN I  D-DIMER, QUANTITATIVE  PRO B NATRIURETIC PEPTIDE  URINALYSIS, ROUTINE W REFLEX MICROSCOPIC    Imaging Review Dg Chest 2 View  11/24/2013   CLINICAL DATA:  43 year old male with pain, vomiting and sickle cell trait.  EXAM: CHEST  2 VIEW  COMPARISON:  11/21/2013 and prior chest radiographs dating back to 06/29/2010  FINDINGS: Cardiomegaly and mild peribronchial thickening again noted.  There is no evidence of focal airspace disease, pulmonary edema, suspicious pulmonary nodule/mass, pleural effusion, or pneumothorax. No acute bony abnormalities are identified.  IMPRESSION: Cardiomegaly without evidence of  active cardiopulmonary disease.   Electronically Signed   By: Laveda Abbe M.D.   On: 11/24/2013 20:29   Dg Abd 2 Views  11/24/2013   CLINICAL DATA:  ABDOMINAL PAIN  EXAM: ABDOMEN - 2 VIEW  COMPARISON:  Abdominal series dated 11/21/2013  FINDINGS: The bowel gas pattern is normal. There is no evidence of free air. No radio-opaque calculi or other significant radiographic abnormality is seen. Postsurgical changes in the pelvis on the right.  IMPRESSION: Negative.   Electronically Signed   By: Salome Holmes M.D.   On: 11/24/2013 19:28     EKG Interpretation   Date/Time:  Friday November 24 2013 21:13:11 EDT Ventricular Rate:  86 PR Interval:  165 QRS Duration: 91 QT Interval:  374 QTC Calculation: 447 R Axis:   38 Text Interpretation:  Sinus rhythm Probable anteroseptal infarct, old  Borderline repolarization abnormality No significant change since last  tracing Confirmed by ZACKOWSKI  MD, [name] [id number]) on 11/24/2013 9:23:56 PM      MDM   Final diagnoses:  None    Medications   ondansetron (ZOFRAN) injection 4 mg (not administered)  ondansetron (ZOFRAN-ODT) disintegrating tablet 4 mg (not administered)   Filed Vitals:   11/24/13 1930 11/24/13 1945 11/24/13 1951 11/24/13 2000  BP: 162/83 174/82  175/87  Pulse:  75  77  Temp:   98.1 F (36.7 C)   TempSrc:   Oral   Resp:  10  14  SpO2: 71% 68%  100%   This provider reviewed patient's chart. Patient was admitted to the hospital on 11/16/2013 for gastroparesis, diabetes, intractable nausea and vomiting. Patient had CT abdomen and pelvis with contrast performed on 11/16/2013 that was negative for acute abnormalities. Patient was last seen and assessed on a 11/21/2013 regarding abdominal pain, nausea and vomiting-patient was discharged home and diagnosed with constipation. EKG noted normal sinus rhythm with a heart rate of 86 beats per minute-no significant changes since last tracing. Troponin negative elevation. D-dimer negative elevation. BNP negative elevation. CBC negative elevated white blood cell count noted-negative left shift or leukocytosis. CMP noted hyponatremia of 136, mild hypokalemia of 3.2, mild low chloride of 92. Elevated glucose of 312. Anion gap of 17.0 mEq per liter. Lipase negative elevation. Abdominal plain film negative for acute findings. Chest x-ray noted cardiomegaly without acute cardiopulmonary disease.  Upon arrival to the ED patient's pulse ox was 71% on room air. Patient was immediately placed on oxygen via nasal cannula at 2 L per minute with pulse ox increasing to 100%. Patient was not tachypneic nor tachycardic. Doubt acute pancreatitis. Patient presenting to the ED with elevated glucose level of 312 with an anion gap of 17.0 mg/L. Patient given subcutaneous insulin. Further labs pending. Patient to be admitted to the hospital for hypoxia and beginnings of DKA-suspicion of abdominal pain to be gastroparesis secondary to poor glucose control. IV access still needed. Discussed case with Earley Favor, NP. Transfer of care to Earley Favor, NP at change in shift. Discussed case with Dr. Mort Sawyers, patient to be admitted to the hospital once IV access and fluids administered. Discussed plan for admission with Earley Favor, NP - Earley Favor, NP to monitor patient while in the ED setting.   Raymon Mutton, PA-C 11/24/13 2219  Raymon Mutton, PA-C 11/24/13 2220  Raymon Mutton, PA-C 11/25/13 0004

## 2013-11-24 NOTE — ED Notes (Signed)
Pt in via EMS to triage, c/o generalized abd pain x3 weeks, constipation x1 week, N/V over the last week also, took a suppository today and had diarrhea x2, CBG for EMS 282

## 2013-11-25 ENCOUNTER — Encounter (HOSPITAL_COMMUNITY): Payer: Self-pay | Admitting: General Practice

## 2013-11-25 DIAGNOSIS — E876 Hypokalemia: Secondary | ICD-10-CM

## 2013-11-25 DIAGNOSIS — K5900 Constipation, unspecified: Secondary | ICD-10-CM

## 2013-11-25 LAB — BASIC METABOLIC PANEL
BUN: 8 mg/dL (ref 6–23)
BUN: 8 mg/dL (ref 6–23)
BUN: 9 mg/dL (ref 6–23)
BUN: 9 mg/dL (ref 6–23)
CALCIUM: 9.1 mg/dL (ref 8.4–10.5)
CALCIUM: 9.3 mg/dL (ref 8.4–10.5)
CALCIUM: 9.3 mg/dL (ref 8.4–10.5)
CHLORIDE: 96 meq/L (ref 96–112)
CO2: 28 meq/L (ref 19–32)
CO2: 28 meq/L (ref 19–32)
CO2: 30 mEq/L (ref 19–32)
CO2: 31 mEq/L (ref 19–32)
CREATININE: 1.13 mg/dL (ref 0.50–1.35)
CREATININE: 1.12 mg/dL (ref 0.50–1.35)
Calcium: 9.3 mg/dL (ref 8.4–10.5)
Chloride: 94 mEq/L — ABNORMAL LOW (ref 96–112)
Chloride: 97 mEq/L (ref 96–112)
Chloride: 98 mEq/L (ref 96–112)
Creatinine, Ser: 1.15 mg/dL (ref 0.50–1.35)
Creatinine, Ser: 1.09 mg/dL (ref 0.50–1.35)
GFR calc Af Amer: 88 mL/min — ABNORMAL LOW (ref >90)
GFR calc Af Amer: >90 mL/min (ref >90)
GFR calc Af Amer: >90 mL/min (ref >90)
GFR calc non Af Amer: 76 mL/min — ABNORMAL LOW (ref >90)
GFR calc non Af Amer: 78 mL/min — ABNORMAL LOW (ref >90)
GFR, EST AFRICAN AMERICAN: 90 mL/min — AB (ref >90)
GFR, EST NON AFRICAN AMERICAN: 77 mL/min — AB (ref >90)
GFR, EST NON AFRICAN AMERICAN: 81 mL/min — AB (ref >90)
GLUCOSE: 220 mg/dL — AB (ref 70–99)
GLUCOSE: 224 mg/dL — AB (ref 70–99)
Glucose, Bld: 224 mg/dL — ABNORMAL HIGH (ref 70–99)
Glucose, Bld: 153 mg/dL — ABNORMAL HIGH (ref 70–99)
POTASSIUM: 3.1 meq/L — AB (ref 3.7–5.3)
Potassium: 2.8 mEq/L — CL (ref 3.7–5.3)
Potassium: 3 mEq/L — ABNORMAL LOW (ref 3.7–5.3)
Potassium: 2.8 mEq/L — CL (ref 3.7–5.3)
SODIUM: 138 meq/L (ref 137–147)
Sodium: 141 mEq/L (ref 137–147)
Sodium: 139 mEq/L (ref 137–147)
Sodium: 140 mEq/L (ref 137–147)

## 2013-11-25 LAB — GLUCOSE, CAPILLARY
GLUCOSE-CAPILLARY: 228 mg/dL — AB (ref 70–99)
GLUCOSE-CAPILLARY: 235 mg/dL — AB (ref 70–99)
GLUCOSE-CAPILLARY: 223 mg/dL — AB (ref 70–99)
GLUCOSE-CAPILLARY: 161 mg/dL — AB (ref 70–99)
GLUCOSE-CAPILLARY: 136 mg/dL — AB (ref 70–99)
GLUCOSE-CAPILLARY: 182 mg/dL — AB (ref 70–99)
GLUCOSE-CAPILLARY: 135 mg/dL — AB (ref 70–99)
Glucose-Capillary: 207 mg/dL — ABNORMAL HIGH (ref 70–99)
Glucose-Capillary: 124 mg/dL — ABNORMAL HIGH (ref 70–99)
Glucose-Capillary: 190 mg/dL — ABNORMAL HIGH (ref 70–99)
Glucose-Capillary: 218 mg/dL — ABNORMAL HIGH (ref 70–99)
Glucose-Capillary: 201 mg/dL — ABNORMAL HIGH (ref 70–99)

## 2013-11-25 LAB — CBC
HCT: 42 % (ref 39.0–52.0)
Hemoglobin: 14.4 g/dL (ref 13.0–17.0)
MCH: 27.4 pg (ref 26.0–34.0)
MCHC: 34.3 g/dL (ref 30.0–36.0)
MCV: 80 fL (ref 78.0–100.0)
PLATELETS: 246 10*3/uL (ref 150–400)
RBC: 5.25 MIL/uL (ref 4.22–5.81)
RDW: 14.3 % (ref 11.5–15.5)
WBC: 11.3 10*3/uL — ABNORMAL HIGH (ref 4.0–10.5)

## 2013-11-25 LAB — TROPONIN I
Troponin I: <0.3 ng/mL (ref <0.30)
Troponin I: <0.3 ng/mL (ref <0.30)
Troponin I: <0.3 ng/mL (ref <0.30)

## 2013-11-25 MED ORDER — POTASSIUM CHLORIDE 10 MEQ/100ML IV SOLN
10.0000 meq | INTRAVENOUS | Status: AC
  Administered 2013-11-25 (×4): 10 meq via INTRAVENOUS
  Filled 2013-11-25 (×4): qty 100

## 2013-11-25 MED ORDER — MORPHINE SULFATE 2 MG/ML IJ SOLN
2.0000 mg | Freq: Once | INTRAMUSCULAR | Status: AC
  Administered 2013-11-25: 2 mg via INTRAVENOUS
  Filled 2013-11-25: qty 1

## 2013-11-25 MED ORDER — INSULIN GLARGINE 100 UNIT/ML ~~LOC~~ SOLN
50.0000 [IU] | Freq: Every day | SUBCUTANEOUS | Status: DC
  Administered 2013-11-25 – 2013-11-26 (×2): 50 [IU] via SUBCUTANEOUS
  Filled 2013-11-25 (×2): qty 0.5

## 2013-11-25 MED ORDER — SENNOSIDES-DOCUSATE SODIUM 8.6-50 MG PO TABS: 2.0000 | ORAL_TABLET | Freq: Two times a day (BID) | ORAL | Status: DC

## 2013-11-25 MED ORDER — METOCLOPRAMIDE HCL 5 MG/ML IJ SOLN
10.0000 mg | Freq: Four times a day (QID) | INTRAMUSCULAR | Status: DC
  Administered 2013-11-25 (×2): 10 mg via INTRAVENOUS
  Filled 2013-11-25 (×6): qty 2

## 2013-11-25 MED ORDER — SODIUM CHLORIDE 0.9 % IV SOLN
INTRAVENOUS | Status: DC
  Administered 2013-11-25: 1.7 [IU]/h via INTRAVENOUS
  Filled 2013-11-25: qty 1

## 2013-11-25 MED ORDER — DEXTROSE 50 % IV SOLN: 25.0000 mL | INTRAVENOUS | Status: DC | PRN

## 2013-11-25 MED ORDER — POLYETHYLENE GLYCOL 3350 17 G PO PACK
17.0000 g | PACK | Freq: Two times a day (BID) | ORAL | Status: DC
  Filled 2013-11-25 (×4): qty 1

## 2013-11-25 MED ORDER — LISINOPRIL 10 MG PO TABS
10.0000 mg | ORAL_TABLET | Freq: Every day | ORAL | Status: DC
  Administered 2013-11-25 – 2013-11-26 (×2): 10 mg via ORAL
  Filled 2013-11-25 (×2): qty 1

## 2013-11-25 MED ORDER — INSULIN GLARGINE 100 UNIT/ML ~~LOC~~ SOLN
30.0000 [IU] | Freq: Every day | SUBCUTANEOUS | Status: DC
  Administered 2013-11-25: 30 [IU] via SUBCUTANEOUS
  Filled 2013-11-25 (×2): qty 0.3

## 2013-11-25 MED ORDER — PANTOPRAZOLE SODIUM 40 MG IV SOLR
40.0000 mg | INTRAVENOUS | Status: DC
  Administered 2013-11-25 (×2): 40 mg via INTRAVENOUS
  Filled 2013-11-25 (×3): qty 40

## 2013-11-25 MED ORDER — SODIUM CHLORIDE 0.9 % IV SOLN
INTRAVENOUS | Status: AC
Start: 2013-11-25 — End: 2013-11-25

## 2013-11-25 MED ORDER — INSULIN ASPART 100 UNIT/ML ~~LOC~~ SOLN
10.0000 [IU] | Freq: Three times a day (TID) | SUBCUTANEOUS | Status: DC
  Administered 2013-11-25 – 2013-11-26 (×3): 10 [IU] via SUBCUTANEOUS

## 2013-11-25 MED ORDER — METOCLOPRAMIDE HCL 5 MG/ML IJ SOLN
10.0000 mg | Freq: Three times a day (TID) | INTRAMUSCULAR | Status: DC
  Administered 2013-11-25 – 2013-11-26 (×5): 10 mg via INTRAVENOUS
  Filled 2013-11-25 (×8): qty 2

## 2013-11-25 MED ORDER — PREGABALIN 75 MG PO CAPS
75.0000 mg | ORAL_CAPSULE | Freq: Three times a day (TID) | ORAL | Status: DC
  Administered 2013-11-25 – 2013-11-26 (×2): 75 mg via ORAL
  Filled 2013-11-25 (×2): qty 1

## 2013-11-25 MED ORDER — ENOXAPARIN SODIUM 40 MG/0.4ML ~~LOC~~ SOLN
40.0000 mg | SUBCUTANEOUS | Status: DC
  Administered 2013-11-25 – 2013-11-26 (×2): 40 mg via SUBCUTANEOUS
  Filled 2013-11-25 (×3): qty 0.4

## 2013-11-25 MED ORDER — SODIUM CHLORIDE 0.9 % IV SOLN
INTRAVENOUS | Status: DC
  Administered 2013-11-25: 01:00:00 via INTRAVENOUS

## 2013-11-25 MED ORDER — OXYCODONE HCL 5 MG PO TABS
5.0000 mg | ORAL_TABLET | ORAL | Status: DC | PRN
  Administered 2013-11-25 (×2): 5 mg via ORAL
  Filled 2013-11-25 (×2): qty 1

## 2013-11-25 MED ORDER — DEXTROSE-NACL 5-0.45 % IV SOLN
INTRAVENOUS | Status: DC
  Administered 2013-11-25: 03:00:00 via INTRAVENOUS

## 2013-11-25 MED ORDER — LUBIPROSTONE 24 MCG PO CAPS
24.0000 ug | ORAL_CAPSULE | Freq: Two times a day (BID) | ORAL | Status: DC
  Administered 2013-11-25 – 2013-11-26 (×3): 24 ug via ORAL
  Filled 2013-11-25 (×5): qty 1

## 2013-11-25 MED ORDER — INSULIN ASPART 100 UNIT/ML ~~LOC~~ SOLN
0.0000 [IU] | Freq: Three times a day (TID) | SUBCUTANEOUS | Status: DC
  Administered 2013-11-25 (×2): 7 [IU] via SUBCUTANEOUS
  Administered 2013-11-26: 3 [IU] via SUBCUTANEOUS
  Administered 2013-11-26: 4 [IU] via SUBCUTANEOUS

## 2013-11-25 MED ORDER — OXYCODONE HCL ER 10 MG PO T12A
10.0000 mg | EXTENDED_RELEASE_TABLET | Freq: Two times a day (BID) | ORAL | Status: DC
  Administered 2013-11-25 – 2013-11-26 (×3): 10 mg via ORAL
  Filled 2013-11-25 (×3): qty 1

## 2013-11-25 NOTE — Progress Notes (Signed)
PATIENT DETAILS Name: Kenneth Fox Age: 43 y.o. Sex: male Date of Birth: 02-02-1971 Admit Date: 11/24/2013 Admitting Physician Ron ParkerHarvette C Jenkins, MD PCP:Pcp Not In System  Subjective: No major complaints-no further vomiting-but is NPO  Assessment/Plan: Principal Problem: Hyperglycemic Hyperosmolar State -admitted and started on a Insulin gtt, will transition to SQ insulin today, add SSI resistant scale -Follow CBG's  Active Problems: Vomiting -belly soft, Xray  Abd no acute abnormalities-suspect Gastroparesis flare-continue reglan-but change to ac+ hs -advance diet  Hypokalemia -replete and recheck in am.Likely secondary to vomiting and Insulin gtt.  Constipation -start bowel regimen-follow     Hypertension -BP on higher side-will start Lisinopril and follow    Obstructive sleep apnea -CPAP QHS   Disposition: Remain inpatient  DVT Prophylaxis: Prophylactic Lovenox   Code Status: Full code   Family Communication None at bedside  Procedures:  None  CONSULTS:  None  Time spent 40 minutes-which includes 50% of the time with face-to-face with patient/ family and coordinating care related to the above assessment and plan.    MEDICATIONS: Scheduled Meds: . sodium chloride   Intravenous Once  . enoxaparin (LOVENOX) injection  40 mg Subcutaneous Q24H  . insulin glargine  50 Units Subcutaneous Daily  . lisinopril  10 mg Oral Daily  . lubiprostone  24 mcg Oral BID WC  . metoCLOPramide (REGLAN) injection  10 mg Intravenous TID PC & HS  . pantoprazole (PROTONIX) IV  40 mg Intravenous Q24H  . polyethylene glycol  17 g Oral BID  . potassium chloride  10 mEq Intravenous Q1 Hr x 4  . senna-docusate  2 tablet Oral BID   Continuous Infusions: . sodium chloride 100 mL/hr at 11/25/13 0050  . dextrose 5 % and 0.45% NaCl 75 mL/hr at 11/25/13 0329  . insulin (NOVOLIN-R) infusion 1.5 Units/hr (11/25/13 0834)   PRN  Meds:.dextrose  Antibiotics: Anti-infectives   None       PHYSICAL EXAM: Vital signs in last 24 hours: Filed Vitals:   11/24/13 2315 11/25/13 0115 11/25/13 0400 11/25/13 0800  BP: 175/91 169/90 152/99 148/55  Pulse: 85 80 85 91  Temp:  98.2 F (36.8 C) 98.3 F (36.8 C) 98.2 F (36.8 C)  TempSrc:  Oral Oral Oral  Resp: 15 16 18 18   Height:  5\' 11"  (1.803 m)    Weight:  144.1 kg (317 lb 10.9 oz)    SpO2: 95% 98% 93% 90%    Weight change:  Filed Weights   11/25/13 0115  Weight: 144.1 kg (317 lb 10.9 oz)   Body mass index is 44.33 kg/(m^2).   Gen Exam: Awake and alert with clear speech.   Neck: Supple, No JVD.   Chest: B/L Clear.   CVS: S1 S2 Regular, no murmurs.  Abdomen: soft, BS +, non tender, non distended.  Extremities: no edema, lower extremities warm to touch. Neurologic: Non Focal.   Skin: No Rash.   Wounds: N/A.    Intake/Output from previous day: No intake or output data in the 24 hours ending 11/25/13 0949   LAB RESULTS: CBC  Recent Labs Lab 11/21/13 1939 11/24/13 1830 11/25/13 0043  WBC 9.6 9.0 11.3*  HGB 15.6 15.2 14.4  HCT 45.1 43.9 42.0  PLT 231 247 246  MCV 80.8 79.4 80.0  MCH 28.0 27.5 27.4  MCHC 34.6 34.6 34.3  RDW 14.3 14.4 14.3  LYMPHSABS 0.8 1.5  --   MONOABS 0.3 0.8  --   EOSABS  0.0 0.0  --   BASOSABS 0.0 0.0  --     Chemistries   Recent Labs Lab 11/24/13 1830 11/25/13 0043 11/25/13 0318 11/25/13 0438 11/25/13 0651  NA 136* 138 141 139 140  K 3.2* 2.8* 3.1* 3.0* 2.8*  CL 92* 94* 96 97 98  CO2 27 28 31 28 30   GLUCOSE 312* 220* 224* 224* 153*  BUN 10 9 8 9 8   CREATININE 1.26 1.15 1.13 1.12 1.09  CALCIUM 9.8 9.3 9.3 9.1 9.3    CBG:  Recent Labs Lab 11/25/13 0420 11/25/13 0522 11/25/13 0624 11/25/13 0729 11/25/13 0833  GLUCAP 235* 223* 161* 124* 136*    GFR Estimated Creatinine Clearance: 125.7 ml/min (by C-G formula based on Cr of 1.09).  Coagulation profile No results found for this basename: INR,  PROTIME,  in the last 168 hours  Cardiac Enzymes  Recent Labs Lab 11/24/13 2012 11/25/13 0043 11/25/13 0438  TROPONINI <0.30 <0.30 <0.30    No components found with this basename: POCBNP,   Recent Labs  11/24/13 2013  DDIMER <0.27   No results found for this basename: HGBA1C,  in the last 72 hours No results found for this basename: CHOL, HDL, LDLCALC, TRIG, CHOLHDL, LDLDIRECT,  in the last 72 hours No results found for this basename: TSH, T4TOTAL, FREET3, T3FREE, THYROIDAB,  in the last 72 hours No results found for this basename: VITAMINB12, FOLATE, FERRITIN, TIBC, IRON, RETICCTPCT,  in the last 72 hours  Recent Labs  11/24/13 1830  LIPASE 47    Urine Studies No results found for this basename: UACOL, UAPR, USPG, UPH, UTP, UGL, UKET, UBIL, UHGB, UNIT, UROB, ULEU, UEPI, UWBC, URBC, UBAC, CAST, CRYS, UCOM, BILUA,  in the last 72 hours  MICROBIOLOGY: No results found for this or any previous visit (from the past 240 hour(s)).  RADIOLOGY STUDIES/RESULTS: Dg Chest 2 View  11/24/2013   CLINICAL DATA:  43 year old male with pain, vomiting and sickle cell trait.  EXAM: CHEST  2 VIEW  COMPARISON:  11/21/2013 and prior chest radiographs dating back to 06/29/2010  FINDINGS: Cardiomegaly and mild peribronchial thickening again noted.  There is no evidence of focal airspace disease, pulmonary edema, suspicious pulmonary nodule/mass, pleural effusion, or pneumothorax. No acute bony abnormalities are identified.  IMPRESSION: Cardiomegaly without evidence of active cardiopulmonary disease.   Electronically Signed   By: Laveda Abbe M.D.   On: 11/24/2013 20:29   Dg Chest 2 View  11/16/2013   CLINICAL DATA:  Chest pain.  Vomiting.  Hypertension and diabetes.  EXAM: CHEST  2 VIEW  COMPARISON:  09/06/2013  FINDINGS: The heart size and mediastinal contours are within normal limits. Streaky opacity is seen in the right middle lobe which is new and may represent atelectasis or pneumonia. No evidence  pleural effusion.  IMPRESSION: Right middle lobe atelectasis versus pneumonia. Recommend chest radiographic followup in several weeks to confirm resolution.   Electronically Signed   By: Myles Rosenthal M.D.   On: 11/16/2013 19:00   Ct Abdomen Pelvis W Contrast  11/16/2013   CLINICAL DATA:  Abdominal pain  EXAM: CT ABDOMEN AND PELVIS WITH CONTRAST  TECHNIQUE: Multidetector CT imaging of the abdomen and pelvis was performed using the standard protocol following bolus administration of intravenous contrast.  CONTRAST:  OMNIPAQUE IOHEXOL 300 MG/ML  SOLN  COMPARISON:  10/05/2013  FINDINGS: The lung bases again demonstrate bibasilar atelectasis. No focal infiltrate is seen.  The liver is diffusely fatty infiltrated. The spleen, gallbladder, adrenal glands  and pancreas are all normal in their CT appearance. The kidneys are well visualized bilaterally and reveal no renal calculi or obstructive changes. Small renal cyst is noted on the right. Small umbilical hernia is noted and stable.  The appendix is well visualized and within normal limits. Bladder is well distended. No pelvic mass lesion or sidewall abnormality is noted. Small retroperitoneal lymph nodes are seen although not significant by size criteria. The osseous structures show postoperative change in the right hemipelvis. No acute bony abnormality is noted.  IMPRESSION: Stable appearance of the abdomen when compared with the prior study. No acute abnormality is seen.   Electronically Signed   By: Alcide CleverMark  Lukens M.D.   On: 11/16/2013 21:36   Dg Abd 2 Views  11/24/2013   CLINICAL DATA:  ABDOMINAL PAIN  EXAM: ABDOMEN - 2 VIEW  COMPARISON:  Abdominal series dated 11/21/2013  FINDINGS: The bowel gas pattern is normal. There is no evidence of free air. No radio-opaque calculi or other significant radiographic abnormality is seen. Postsurgical changes in the pelvis on the right.  IMPRESSION: Negative.   Electronically Signed   By: Salome HolmesHector  Cooper M.D.   On:  11/24/2013 19:28   Dg Abd 2 Views  11/16/2013   CLINICAL DATA:  Abdominal pain  EXAM: ABDOMEN - 2 VIEW  COMPARISON:  None.  FINDINGS: Scattered large and small bowel gas is noted. Ingested tablet is noted in the right hemi abdomen. Postsurgical changes in the pelvis are seen. No acute bony abnormality is noted. No focal mass lesion is seen.  IMPRESSION: No acute abnormality noted.   Electronically Signed   By: Alcide CleverMark  Lukens M.D.   On: 11/16/2013 19:12   Dg Abd Acute W/chest  11/21/2013   CLINICAL DATA:  Upper abdominal pain.  EXAM: ACUTE ABDOMEN SERIES (ABDOMEN 2 VIEW & CHEST 1 VIEW)  COMPARISON:  CT of the abdomen pelvis November 16, 2013, chest radiograph November 16, 2013  FINDINGS: Cardiac silhouette appears mildly enlarged, mediastinal silhouette is nonsuspicious. Bibasilar strandy densities. No pleural effusions. No pneumothorax. Large body habitus. Osseous structures are nonsuspicious.  Bowel gas pattern is nondilated and nonobstructive. No intra-abdominal mass effect or pathologic calcifications ; phleboliths in the right pelvis. No intraperitoneal free air. Status post right acetabular OR by CT.  IMPRESSION: Mild cardiomegaly, bibasilar strandy densities favor atelectasis.  Nonspecific bowel gas pattern.   Electronically Signed   By: Awilda Metroourtnay  Bloomer   On: 11/21/2013 22:55    Jeoffrey MassedGHIMIRE,SHANKER, MD  [facility] Pager:[phone]  If 7PM-7AM, please contact night-coverage [email or web address] Password [email or web address] 11/25/2013, 9:49 AM   LOS: 1 day   **Disclaimer: This note may have been dictated with voice recognition software. Similar sounding words can inadvertently be transcribed and this note may contain transcription errors which may not have been corrected upon publication of note.**

## 2013-11-25 NOTE — Progress Notes (Signed)
CRITICAL VALUE ALERT  Critical value received:  K 2.8  Date of notification:  11/25/13  Time of notification:  1:29am  Critical value read back:yes  Nurse who received alert:  Herminio Headsoliza Lochlyn Zullo  MD notified (1st page):  Callahan,NP  Time of first page:  1:31am  MD notified (2nd page):  Time of second page:  Responding MD:  Willia Crazeallahan,NP  Time MD responded:  1:34

## 2013-11-25 NOTE — Progress Notes (Signed)
Pt arrived on floor, alert and oriented x4. Able to make needs known. Appeared to be in no distress, no SOB noted. R AC IV site clean, dry and intact. Vital signs taken. Skin intact as assessed. Carried out doctors order. Oriented to room and staff. Bed at it's lowest position. Call light place within reached. Placed on tele box 21. We will continue to monitor.

## 2013-11-25 NOTE — ED Provider Notes (Signed)
Medical screening examination/treatment/procedure(s) were performed by non-physician practitioner and as supervising physician I was immediately available for consultation/collaboration.   EKG Interpretation   Date/Time:  Friday November 24 2013 21:13:11 EDT Ventricular Rate:  86 PR Interval:  165 QRS Duration: 91 QT Interval:  374 QTC Calculation: 447 R Axis:   38 Text Interpretation:  Sinus rhythm Probable anteroseptal infarct, old  Borderline repolarization abnormality No significant change since last  tracing Confirmed by ZACKOWSKI  MD, [name] [phone] on 11/24/2013 9:23:56 PM        Gwyneth SproutWhitney Keyairra Kolinski, MD 11/25/13 1259

## 2013-11-25 NOTE — H&P (Addendum)
[facility] History and Physical  Kenneth Rathkehomas E Girgis ZOX:[id number] DOB: [birth year] DOA: 11/24/2013  Referring physician: EDP PCP: Pcp Not In System  Specialists:   Chief Complaint:  Nausea and Vomiting and ABD Pain  HPI: Kenneth Fox is a 43 y.o. male with a history of Uncontrolled DM2 on Insulin Rx, and Diabetic Gastroparesis , HTN, and Diabetic Neuropathy who presents to the ED with complaints of  Increased nausea and vomiting and constipation over the past week.   He reports not being able to hold down foods or liquids over the past 2 days.  He denies fevers or chills or hematemesis.   He reports chest tightness as well.   He was evaluated in the ED and was found to have DKA and was referred for admission.     Review of Systems:  Constitutional: No Weight Loss, No Weight Gain, Night Sweats, Fevers, Chills, Fatigue, or Generalized Weakness HEENT: No Headaches, Difficulty Swallowing,Tooth/Dental Problems,Sore Throat,  No Sneezing, Rhinitis, Ear Ache, Nasal Congestion, or Post Nasal Drip,  Cardio-vascular:  +Chest pain, Orthopnea, PND, Edema in lower extremities, Anasarca, Dizziness, Palpitations  Resp: No Dyspnea, No DOE, No Cough, No Hemoptysis,  No Wheezing.    GI: No Heartburn, Indigestion, +Abdominal Pain,+Nausea, +Vomiting, Diarrhea, +Constipation, Change in Bowel Habits,  Loss of Appetite  GU: No Dysuria, Change in Color of Urine, No Urgency or Frequency.  No flank pain.  Musculoskeletal: No Joint Pain or Swelling.  No Decreased Range of Motion. No Back Pain.  Neurologic: No Syncope, No Seizures, Muscle Weakness, Paresthesia, Vision Disturbance or Loss, No Diplopia, No Vertigo, No Difficulty Walking,  Skin: No Rash or Lesions. Psych: No Change in Mood or Affect. No Depression or Anxiety. No Memory loss. No Confusion or Hallucinations   Past Medical History  Diagnosis Date  . Hypertension   . Neuropathy   . Anxiety   . Depression   . Sickle cell trait   .  Gastroenteritis   . Obesity   . Constipation   . High cholesterol   . Asthma   . Chronic bronchitis     "get it q yr" (11/25/2013)  . Sleep apnea     "don't use CPAP" (11/25/2013)  . Type II diabetes mellitus   . GERD (gastroesophageal reflux disease)   . Stomach ulcer   . Fibromyalgia     Past Surgical History  Procedure Laterality Date  . Hip fracture surgery Right 1999    "put a plate in"     Prior to Admission medications   Medication Sig Start Date End Date Taking? Authorizing Provider  insulin aspart (NOVOLOG) 100 UNIT/ML injection Inject 10-20 Units into the skin 3 (three) times daily with meals. per sliding scale CBG 70 - 120: 0 units CBG 121 - 150: 2 units CBG 151 - 200: 3 units CBG 201 - 250: 5 units CBG 251 - 300: 8 units CBG 301 - 350: 11 units CBG 351 - 400: 15 units 11/09/12  Yes Marianne L York, PA-C  insulin detemir (LEVEMIR) 100 UNIT/ML injection Inject 30 Units into the skin at bedtime.    Yes Historical Provider, MD  metFORMIN (GLUCOPHAGE) 1000 MG tablet Take 1,000 mg by mouth 2 (two) times daily with a meal.   Yes Historical Provider, MD  omeprazole (PRILOSEC) 20 MG capsule Take 1 capsule (20 mg total) by mouth 2 (two) times daily. 10/04/13  Yes Rolland PorterMark James, MD  ondansetron (ZOFRAN) 4 MG tablet Take 1 tablet (4 mg total) by mouth every  6 (six) hours. 10/04/13  Yes Rolland PorterMark James, MD  OxyCODONE (OXYCONTIN) 10 mg T12A 12 hr tablet Take 10 mg by mouth every 12 (twelve) hours.   Yes Historical Provider, MD  polyethylene glycol (MIRALAX) packet Take 17 g by mouth daily. 11/18/13  Yes Jerald KiefStephen K Chiu, MD  pregabalin (LYRICA) 75 MG capsule Take 75 mg by mouth 3 (three) times daily.   Yes Historical Provider, MD  promethazine (PHENERGAN) 25 MG tablet Take 25 mg by mouth every 8 (eight) hours as needed for nausea or vomiting.    Yes Historical Provider, MD    No Known Allergies   Social History:  reports that he has never smoked. He has never used smokeless tobacco. He  reports that he does not drink alcohol or use illicit drugs.     Family History  Problem Relation Age of Onset  . Diabetes Father   . Neuropathy Father   . Hypertension Father   . Sickle cell anemia Mother       Physical Exam:  GEN:  Morbidly Obese  43 y.o. African American male examined and in no acute distress; cooperative with exam Filed Vitals:   11/24/13 2245 11/24/13 2300 11/24/13 2315 11/25/13 0115  BP: 185/83 174/84 175/91 169/90  Pulse: 84 82 85 80  Temp:    98.2 F (36.8 C)  TempSrc:    Oral  Resp: 16 16 15 16   SpO2: 95% 97% 95% 98%   Blood pressure 169/90, pulse 80, temperature 98.2 F (36.8 C), temperature source Oral, resp. rate 16, SpO2 98.00%. PSYCH: He is alert and oriented x4; does not appear anxious does not appear depressed; affect is normal HEENT: Normocephalic and Atraumatic, Mucous membranes pink; PERRLA; EOM intact; Fundi:  Benign;  No scleral icterus, Nares: Patent, Oropharynx: Clear Dry Oral Mucosa, Fair Dentition, Neck:  FROM, no cervical lymphadenopathy nor thyromegaly or carotid bruit; no JVD; Breasts:: Not examined CHEST WALL: No tenderness CHEST: Normal respiration, clear to auscultation bilaterally HEART: Regular rate and rhythm; no murmurs rubs or gallops BACK: No kyphosis or scoliosis; no CVA tenderness ABDOMEN: Positive Bowel Sounds, Obese, soft non-tender; no masses, no organomegaly. Rectal Exam: Not done EXTREMITIES: No cyanosis, clubbing or edema; no ulcerations. Genitalia: not examined PULSES: 2+ and symmetric SKIN: Normal hydration no rash or ulceration CNS:  Alert and Oriented x 4,  No Focal Deficits.   Vascular: pulses palpable throughout    Labs on Admission:  Basic Metabolic Panel:  Recent Labs Lab 11/21/13 1939 11/24/13 1830 11/25/13 0043  NA 139 136* 138  K 3.5* 3.2* 2.8*  CL 95* 92* 94*  CO2 29 27 28   GLUCOSE 364* 312* 220*  BUN 7 10 9   CREATININE 0.94 1.26 1.15  CALCIUM 9.8 9.8 9.3   Liver Function  Tests:  Recent Labs Lab 11/21/13 1939 11/24/13 1830  AST 20 14  ALT 19 14  ALKPHOS 111 98  BILITOT 0.4 0.4  PROT 7.9 7.4  ALBUMIN 3.1* 3.1*    Recent Labs Lab 11/21/13 1939 11/24/13 1830  LIPASE 33 47   No results found for this basename: AMMONIA,  in the last 168 hours CBC:  Recent Labs Lab 11/21/13 1939 11/24/13 1830 11/25/13 0043  WBC 9.6 9.0 PENDING  NEUTROABS 8.5* 6.7  --   HGB 15.6 15.2 14.4  HCT 45.1 43.9 42.0  MCV 80.8 79.4 80.0  PLT 231 247 246   Cardiac Enzymes:  Recent Labs Lab 11/21/13 1939 11/24/13 2012 11/25/13 0043  TROPONINI <0.30 <0.30 <0.30  BNP (last 3 results)  Recent Labs  11/24/13 2012  PROBNP 72.7   CBG:  Recent Labs Lab 11/18/13 0408 11/18/13 0749 11/18/13 1145 11/21/13 2015 11/21/13 2140  GLUCAP 154* 110* 118* 359* 363*    Radiological Exams on Admission: Dg Chest 2 View  11/24/2013   CLINICAL DATA:  43 year old male with pain, vomiting and sickle cell trait.  EXAM: CHEST  2 VIEW  COMPARISON:  11/21/2013 and prior chest radiographs dating back to 06/29/2010  FINDINGS: Cardiomegaly and mild peribronchial thickening again noted.  There is no evidence of focal airspace disease, pulmonary edema, suspicious pulmonary nodule/mass, pleural effusion, or pneumothorax. No acute bony abnormalities are identified.  IMPRESSION: Cardiomegaly without evidence of active cardiopulmonary disease.   Electronically Signed   By: Laveda Abbe M.D.   On: 11/24/2013 20:29   Dg Abd 2 Views  11/24/2013   CLINICAL DATA:  ABDOMINAL PAIN  EXAM: ABDOMEN - 2 VIEW  COMPARISON:  Abdominal series dated 11/21/2013  FINDINGS: The bowel gas pattern is normal. There is no evidence of free air. No radio-opaque calculi or other significant radiographic abnormality is seen. Postsurgical changes in the pelvis on the right.  IMPRESSION: Negative.   Electronically Signed   By: Salome Holmes M.D.   On: 11/24/2013 19:28      EKG: Independently reviewed. On 06/16  was NSR without Acute Changes    Assessment/Plan:   43 y.o. male with  Principal Problem:   DKA (diabetic ketoacidoses) Active Problems:   Nausea with vomiting   Diabetic gastroparesis   Hypertension   Obstructive sleep apnea   Constipation   Hypokalemia    Chest Pian   1.   DKA-   Placed on the DKA Protocol with IV Insulin, and IVFs.  K+Repletion ordered per protocol.   Monitor Electrolytes.    2.   Nausea and Vomiting -  Due to #1, and Diabetic Gastroparesis, and Constipation.   PRN IV Anti-Emetics, and IV Reglan  QID.     3.   Diabetic Gastroparesis-   Due to poorly controlled Diabetes,  IV Reglan QID.     4.   HTN-  IV Hydralazine PRN SBP > 160.     5.   Hypokalemia-  Due to #1 and #2,  Replete K+ and Check Magnesium level.     6.   Constipation-  Due to Opioid Rx   Fleets Enema x1, Continue Miralax daily, and  Add lactulose 30 cc PO Q OD PRN constipation.     7.   DVT Prophylaxis with Lovenox  8.  Chest Pain-  Cardiac monitoring, Cycle Troponins,     Code Status:   FULL CODE    Family Communication:    No Family Present Disposition Plan:     Inpatient       Time spent:  53 Minutes  Ron Parker [facility] Pager [phone]  If 7PM-7AM, please contact night-coverage [email or web address] Password [email or web address] 11/25/2013, 1:52 AM

## 2013-11-25 NOTE — Progress Notes (Signed)
CRITICAL VALUE ALERT  Critical value received:  Potassium 2.8    Date of notification:  11/25/13  Time of notification:  802  Critical value read back:yes  Nurse who received alert:  Madelin RearLonnie whitaker  MD notified (1st page):  Ghimire  Time of first page:  803  MD notified (2nd page):  Time of second page:  Responding MD:  Jerral RalphGhimire  Time MD responded:  [phone]

## 2013-11-25 NOTE — Plan of Care (Addendum)
Problem: Food- and Nutrition-Related Knowledge Deficit (NB-1.1) Goal: Nutrition education Formal process to instruct or train a patient/client in a skill or to impart knowledge to help patients/clients voluntarily manage or modify food choices and eating behavior to maintain or improve health. Outcome: Completed/Met Date Met:  11/25/13  RD consulted for nutrition education regarding diabetes.   Pt sleeping. Able to arouse, however pt falls back to sleep quickly. Wife at bedside. Wife appears frustrated as this is pt's third admission for the same reason. Per wife pt is very non-compliant and will likely not follow recommended diet.  Per wife no recent unintentional weight loss.      Lab Results  Component Value Date    HGBA1C 11.1* 11/07/2012    Discussed rationale behind gastroparesis diet. Encouraged limited fat and fiber. Foods reviewed. Teach back method used.  Expect poor compliance.  Body mass index is 44.33 kg/(m^2). Pt meets criteria for Morbid Obesity based on current BMI.  Current diet order is NPO. Labs and medications reviewed. No further nutrition interventions warranted at this time. RD contact information provided. If additional nutrition issues arise, please re-consult RD.  Heather Pitts RD, LDN, CNSC [phone] Pager [phone] After Hours Pager         

## 2013-11-26 LAB — MAGNESIUM: MAGNESIUM: 1.8 mg/dL (ref 1.5–2.5)

## 2013-11-26 LAB — BASIC METABOLIC PANEL
BUN: 8 mg/dL (ref 6–23)
BUN: 8 mg/dL (ref 6–23)
CALCIUM: 9.2 mg/dL (ref 8.4–10.5)
CO2: 30 meq/L (ref 19–32)
CO2: 29 meq/L (ref 19–32)
CREATININE: 1.03 mg/dL (ref 0.50–1.35)
Calcium: 9.4 mg/dL (ref 8.4–10.5)
Chloride: 99 mEq/L (ref 96–112)
Chloride: 99 mEq/L (ref 96–112)
Creatinine, Ser: 1.14 mg/dL (ref 0.50–1.35)
GFR calc Af Amer: 89 mL/min — ABNORMAL LOW (ref >90)
GFR calc non Af Amer: 87 mL/min — ABNORMAL LOW (ref >90)
GFR, EST NON AFRICAN AMERICAN: 77 mL/min — AB (ref >90)
GLUCOSE: 126 mg/dL — AB (ref 70–99)
Glucose, Bld: 145 mg/dL — ABNORMAL HIGH (ref 70–99)
Potassium: 2.8 mEq/L — CL (ref 3.7–5.3)
Potassium: 3.5 mEq/L — ABNORMAL LOW (ref 3.7–5.3)
SODIUM: 140 meq/L (ref 137–147)
Sodium: 142 mEq/L (ref 137–147)

## 2013-11-26 LAB — GLUCOSE, CAPILLARY
GLUCOSE-CAPILLARY: 133 mg/dL — AB (ref 70–99)
GLUCOSE-CAPILLARY: 156 mg/dL — AB (ref 70–99)

## 2013-11-26 MED ORDER — INSULIN ASPART 100 UNIT/ML ~~LOC~~ SOLN: 10.0000 [IU] | Freq: Three times a day (TID) | SUBCUTANEOUS | Status: DC

## 2013-11-26 MED ORDER — POTASSIUM CHLORIDE 10 MEQ/100ML IV SOLN
10.0000 meq | INTRAVENOUS | Status: AC
  Administered 2013-11-26 (×3): 10 meq via INTRAVENOUS
  Filled 2013-11-26 (×3): qty 100

## 2013-11-26 MED ORDER — INSULIN DETEMIR 100 UNIT/ML ~~LOC~~ SOLN: 50.0000 [IU] | Freq: Every morning | SUBCUTANEOUS | Status: DC

## 2013-11-26 MED ORDER — LISINOPRIL 20 MG PO TABS: 20.0000 mg | ORAL_TABLET | Freq: Every day | ORAL | Status: DC

## 2013-11-26 MED ORDER — METOCLOPRAMIDE HCL 10 MG PO TABS: 10.0000 mg | ORAL_TABLET | Freq: Three times a day (TID) | ORAL | Status: DC

## 2013-11-26 MED ORDER — INSULIN DETEMIR 100 UNIT/ML ~~LOC~~ SOLN: 40.0000 [IU] | Freq: Every day | SUBCUTANEOUS | Status: DC

## 2013-11-26 MED ORDER — OXYCODONE HCL 5 MG PO TABS: 5.0000 mg | ORAL_TABLET | ORAL | Status: DC | PRN

## 2013-11-26 MED ORDER — POTASSIUM CHLORIDE CRYS ER 20 MEQ PO TBCR
40.0000 meq | EXTENDED_RELEASE_TABLET | Freq: Once | ORAL | Status: AC
  Administered 2013-11-26: 40 meq via ORAL
  Filled 2013-11-26: qty 2

## 2013-11-26 MED ORDER — MAGNESIUM SULFATE 40 MG/ML IJ SOLN
2.0000 g | Freq: Once | INTRAMUSCULAR | Status: AC
  Administered 2013-11-26: 2 g via INTRAVENOUS
  Filled 2013-11-26: qty 50

## 2013-11-26 MED ORDER — POTASSIUM CHLORIDE ER 20 MEQ PO TBCR: 20.0000 meq | EXTENDED_RELEASE_TABLET | Freq: Every day | ORAL | Status: DC

## 2013-11-26 NOTE — Progress Notes (Signed)
RT has checked on pt x2 to place on CPAP. Both times pt was not ready. Pt advised to call RT when ready to be placed on CPAP.

## 2013-11-26 NOTE — Discharge Summary (Signed)
PATIENT DETAILS Name: Kenneth Fox Age: 43 y.o. Sex: male Date of Birth: 12/13/1970 MRN: [id number]. Admit Date: 11/24/2013 Admitting Physician: Ron ParkerHarvette C Jenkins, MD PCP:Pcp Not In System  Recommendations for Outpatient Follow-up:  1. Optimize Diabetic Regimen 2. Please check BMET at next visit. 3. Needs outpatient sleep study  PRIMARY DISCHARGE DIAGNOSIS:  Principal Problem:   DKA (diabetic ketoacidoses) Active Problems:   Hypertension   Obstructive sleep apnea   Nausea with vomiting   Diabetic gastroparesis   Constipation   Hypokalemia      PAST MEDICAL HISTORY: Past Medical History  Diagnosis Date  . Hypertension   . Neuropathy   . Anxiety   . Depression   . Sickle cell trait   . Gastroenteritis   . Obesity   . Constipation   . High cholesterol   . Asthma   . Chronic bronchitis     "get it q yr" (11/25/2013)  . Sleep apnea     "don't use CPAP" (11/25/2013)  . Type II diabetes mellitus   . GERD (gastroesophageal reflux disease)   . Stomach ulcer   . Fibromyalgia     DISCHARGE MEDICATIONS:   Medication List    STOP taking these medications       OxyCODONE 10 mg T12a 12 hr tablet  Commonly known as:  OXYCONTIN  Replaced by:  oxyCODONE 5 MG immediate release tablet      TAKE these medications       insulin aspart 100 UNIT/ML injection  Commonly known as:  novoLOG  - Inject 10-20 Units into the skin 3 (three) times daily with meals. per sliding scale  - CBG 70 - 120: 0 units  - CBG 121 - 150: 2 units  - CBG 151 - 200: 3 units  - CBG 201 - 250: 5 units  - CBG 251 - 300: 8 units  - CBG 301 - 350: 11 units  - CBG 351 - 400: 15 units     insulin detemir 100 UNIT/ML injection  Commonly known as:  LEVEMIR  Inject 0.4 mLs (40 Units total) into the skin at bedtime.     insulin detemir 100 UNIT/ML injection  Commonly known as:  LEVEMIR  Inject 0.5 mLs (50 Units total) into the skin every morning.     lisinopril 20 MG tablet  Commonly  known as:  PRINIVIL,ZESTRIL  Take 1 tablet (20 mg total) by mouth daily.  Start taking on:  11/27/2013     metFORMIN 1000 MG tablet  Commonly known as:  GLUCOPHAGE  Take 1,000 mg by mouth 2 (two) times daily with a meal.     metoCLOPramide 10 MG tablet  Commonly known as:  REGLAN  Take 1 tablet (10 mg total) by mouth 3 (three) times daily before meals.     omeprazole 20 MG capsule  Commonly known as:  PRILOSEC  Take 1 capsule (20 mg total) by mouth 2 (two) times daily.     ondansetron 4 MG tablet  Commonly known as:  ZOFRAN  Take 1 tablet (4 mg total) by mouth every 6 (six) hours.     oxyCODONE 5 MG immediate release tablet  Commonly known as:  Oxy IR/ROXICODONE  Take 1 tablet (5 mg total) by mouth every 4 (four) hours as needed for moderate pain.     polyethylene glycol packet  Commonly known as:  MIRALAX  Take 17 g by mouth daily.     Potassium Chloride ER 20 MEQ Tbcr  Take  20 mEq by mouth daily.     pregabalin 75 MG capsule  Commonly known as:  LYRICA  Take 75 mg by mouth 3 (three) times daily.     promethazine 25 MG tablet  Commonly known as:  PHENERGAN  Take 25 mg by mouth every 8 (eight) hours as needed for nausea or vomiting.        ALLERGIES:  No Known Allergies  BRIEF HPI:  See H&P, Labs, Consult and Test reports for all details in brief, patient was admitted for persistent nausea and vomting.  CONSULTATIONS:   None  PERTINENT RADIOLOGIC STUDIES: Dg Chest 2 View  11/24/2013   CLINICAL DATA:  43 year old male with pain, vomiting and sickle cell trait.  EXAM: CHEST  2 VIEW  COMPARISON:  11/21/2013 and prior chest radiographs dating back to 06/29/2010  FINDINGS: Cardiomegaly and mild peribronchial thickening again noted.  There is no evidence of focal airspace disease, pulmonary edema, suspicious pulmonary nodule/mass, pleural effusion, or pneumothorax. No acute bony abnormalities are identified.  IMPRESSION: Cardiomegaly without evidence of active  cardiopulmonary disease.   Electronically Signed   By: Laveda AbbeJeff  Hu M.D.   On: 11/24/2013 20:29   Dg Chest 2 View  11/16/2013   CLINICAL DATA:  Chest pain.  Vomiting.  Hypertension and diabetes.  EXAM: CHEST  2 VIEW  COMPARISON:  09/06/2013  FINDINGS: The heart size and mediastinal contours are within normal limits. Streaky opacity is seen in the right middle lobe which is new and may represent atelectasis or pneumonia. No evidence pleural effusion.  IMPRESSION: Right middle lobe atelectasis versus pneumonia. Recommend chest radiographic followup in several weeks to confirm resolution.   Electronically Signed   By: Myles RosenthalJohn  Stahl M.D.   On: 11/16/2013 19:00   Ct Abdomen Pelvis W Contrast  11/16/2013   CLINICAL DATA:  Abdominal pain  EXAM: CT ABDOMEN AND PELVIS WITH CONTRAST  TECHNIQUE: Multidetector CT imaging of the abdomen and pelvis was performed using the standard protocol following bolus administration of intravenous contrast.  CONTRAST:  100mL OMNIPAQUE IOHEXOL 300 MG/ML  SOLN  COMPARISON:  10/05/2013  FINDINGS: The lung bases again demonstrate bibasilar atelectasis. No focal infiltrate is seen.  The liver is diffusely fatty infiltrated. The spleen, gallbladder, adrenal glands and pancreas are all normal in their CT appearance. The kidneys are well visualized bilaterally and reveal no renal calculi or obstructive changes. Small renal cyst is noted on the right. Small umbilical hernia is noted and stable.  The appendix is well visualized and within normal limits. Bladder is well distended. No pelvic mass lesion or sidewall abnormality is noted. Small retroperitoneal lymph nodes are seen although not significant by size criteria. The osseous structures show postoperative change in the right hemipelvis. No acute bony abnormality is noted.  IMPRESSION: Stable appearance of the abdomen when compared with the prior study. No acute abnormality is seen.   Electronically Signed   By: Alcide CleverMark  Lukens M.D.   On: 11/16/2013  21:36   Dg Abd 2 Views  11/24/2013   CLINICAL DATA:  ABDOMINAL PAIN  EXAM: ABDOMEN - 2 VIEW  COMPARISON:  Abdominal series dated 11/21/2013  FINDINGS: The bowel gas pattern is normal. There is no evidence of free air. No radio-opaque calculi or other significant radiographic abnormality is seen. Postsurgical changes in the pelvis on the right.  IMPRESSION: Negative.   Electronically Signed   By: Salome HolmesHector  Cooper M.D.   On: 11/24/2013 19:28   Dg Abd 2 Views  11/16/2013  CLINICAL DATA:  Abdominal pain  EXAM: ABDOMEN - 2 VIEW  COMPARISON:  None.  FINDINGS: Scattered large and small bowel gas is noted. Ingested tablet is noted in the right hemi abdomen. Postsurgical changes in the pelvis are seen. No acute bony abnormality is noted. No focal mass lesion is seen.  IMPRESSION: No acute abnormality noted.   Electronically Signed   By: Alcide Clever M.D.   On: 11/16/2013 19:12   Dg Abd Acute W/chest  11/21/2013   CLINICAL DATA:  Upper abdominal pain.  EXAM: ACUTE ABDOMEN SERIES (ABDOMEN 2 VIEW & CHEST 1 VIEW)  COMPARISON:  CT of the abdomen pelvis November 16, 2013, chest radiograph November 16, 2013  FINDINGS: Cardiac silhouette appears mildly enlarged, mediastinal silhouette is nonsuspicious. Bibasilar strandy densities. No pleural effusions. No pneumothorax. Large body habitus. Osseous structures are nonsuspicious.  Bowel gas pattern is nondilated and nonobstructive. No intra-abdominal mass effect or pathologic calcifications ; phleboliths in the right pelvis. No intraperitoneal free air. Status post right acetabular OR by CT.  IMPRESSION: Mild cardiomegaly, bibasilar strandy densities favor atelectasis.  Nonspecific bowel gas pattern.   Electronically Signed   By: Awilda Metro   On: 11/21/2013 22:55     PERTINENT LAB RESULTS: CBC:  Recent Labs  11/24/13 1830 11/25/13 0043  WBC 9.0 11.3*  HGB 15.2 14.4  HCT 43.9 42.0  PLT 247 246   CMET CMP     Component Value Date/Time   NA 140 11/26/2013 1239    K 3.5* 11/26/2013 1239   CL 99 11/26/2013 1239   CO2 29 11/26/2013 1239   GLUCOSE 145* 11/26/2013 1239   BUN 8 11/26/2013 1239   CREATININE 1.03 11/26/2013 1239   CALCIUM 9.4 11/26/2013 1239   PROT 7.4 11/24/2013 1830   ALBUMIN 3.1* 11/24/2013 1830   AST 14 11/24/2013 1830   ALT 14 11/24/2013 1830   ALKPHOS 98 11/24/2013 1830   BILITOT 0.4 11/24/2013 1830   GFRNONAA 87* 11/26/2013 1239   GFRAA >90 11/26/2013 1239    GFR Estimated Creatinine Clearance: 133.1 ml/min (by C-G formula based on Cr of 1.03).  Recent Labs  11/24/13 1830  LIPASE 47    Recent Labs  11/25/13 0043 11/25/13 0438 11/25/13 1245  TROPONINI <0.30 <0.30 <0.30   No components found with this basename: POCBNP,   Recent Labs  11/24/13 2013  DDIMER <0.27   No results found for this basename: HGBA1C,  in the last 72 hours No results found for this basename: CHOL, HDL, LDLCALC, TRIG, CHOLHDL, LDLDIRECT,  in the last 72 hours No results found for this basename: TSH, T4TOTAL, FREET3, T3FREE, THYROIDAB,  in the last 72 hours No results found for this basename: VITAMINB12, FOLATE, FERRITIN, TIBC, IRON, RETICCTPCT,  in the last 72 hours Coags: No results found for this basename: PT, INR,  in the last 72 hours Microbiology: No results found for this or any previous visit (from the past 240 hour(s)).   BRIEF HOSPITAL COURSE:   Principal Problem: Hyperglycemic Hyperosmolar State  -admitted and started on a Insulin gtt,transitioned to SQ insulin and SSI resistant. See below  Active Problems:  Uncontrolled DM with Neuropathy -As noted above, admitted and started on Insulin gtt, now transitioned to SQ Insulin, will continue with Levemir 50 units in am and 40 units QHS. Continue with SSI as previous. Have asked patient to follow up with PCP in the next few days for further optimization of Insulin regime.  Vomiting-likely Gastroparesis -belly soft, Xray Abd no  acute abnormalities. Admitted and initially kept NPO, started on  Reglan,diet has been advanced to a regular diet. No further vomiting at all-last vomiting episode was on admission. Will discharge on Reglan, asked patient to eat small portion meals. Dietician also educated patient regarding Diabetic and Gastroparesis diet as well  Hypokalemia  -Likely secondary to vomiting and Insulin gtt.  -This was repleted, patient will be discharged on oral KCL for a few days. Have asked patient to request his PCP to check BMET.  Constipation  -stable-c/w Miralax on discharge  Hypertension  -BP on higher side-started Lisinopril, please continue to monitor and adjust meds as outpatient.  Obstructive sleep apnea  -CPAP QHS while in hospital, will need a sleep study as outpatient. Defer to PCP  TODAY-DAY OF DISCHARGE:  Subjective:   Kenneth Fox today has no headache,no chest abdominal pain,no new weakness tingling or numbness, feels much better wants to go home today.   Objective:   Blood pressure 157/102, pulse 100, temperature 97.5 F (36.4 C), temperature source Oral, resp. rate 18, height 5\' 11"  (1.803 m), weight 144.1 kg (317 lb 10.9 oz), SpO2 93.00%.  Intake/Output Summary (Last 24 hours) at 11/26/13 1347 Last data filed at 11/25/13 2315  Gross per 24 hour  Intake    636 ml  Output      0 ml  Net    636 ml   Filed Weights   11/25/13 0115  Weight: 144.1 kg (317 lb 10.9 oz)    Exam Awake Alert, Oriented *3, No new F.N deficits, Normal affect [place].AT,PERRAL Supple Neck,No JVD, No cervical lymphadenopathy appriciated.  Symmetrical Chest wall movement, Good air movement bilaterally, CTAB RRR,No Gallops,Rubs or new Murmurs, No Parasternal Heave +ve B.Sounds, Abd Soft, Non tender, No organomegaly appriciated, No rebound -guarding or rigidity. No Cyanosis, Clubbing or edema, No new Rash or bruise  DISCHARGE CONDITION: Stable  DISPOSITION: Home  DISCHARGE INSTRUCTIONS:    Activity:  As tolerated   Diet recommendation: Diabetic Diet Heart  Healthy diet  Discharge Instructions   Call MD for:  persistant nausea and vomiting    Complete by:  As directed      Call MD for:  severe uncontrolled pain    Complete by:  As directed      Diet - low sodium heart healthy    Complete by:  As directed      Diet Carb Modified    Complete by:  As directed      Increase activity slowly    Complete by:  As directed            Follow-up Information   Follow up with Shelba Flake, MD. Schedule an appointment as soon as possible for a visit in 5 days. (please ask your Primary MD to check your Potassium level)    Specialty:  Emergency Medicine   Contact information:   [address] [place] [address] [phone]      Total Time spent on discharge equals 45 minutes.  SignedJeoffrey Massed 11/26/2013 1:47 PM  **Disclaimer: This note may have been dictated with voice recognition software. Similar sounding words can inadvertently be transcribed and this note may contain transcription errors which may not have been corrected upon publication of note.**

## 2013-11-26 NOTE — Progress Notes (Signed)
Per RN pt refuses to wear CPAP tonight.

## 2013-11-26 NOTE — Progress Notes (Signed)
CRITICAL VALUE ALERT  Critical value received: potassium 2.8 Date of notification:  11/26/13  Time of notification:  0540  Critical value read back:yes  Nurse who received alert:  Jasmine DecemberSharon  MD notified (1st page):  Internal Medicine - On call Amion  Time of first page:  0600  MD notified (2nd page):  Time of second page:  Responding MD:  Dr. Claiborne Billingsallahan  Time MD responded:  [phone]

## 2013-11-29 ENCOUNTER — Ambulatory Visit: Payer: Medicaid Other | Admitting: *Deleted

## 2013-11-30 ENCOUNTER — Emergency Department (HOSPITAL_COMMUNITY)
Admission: EM | Admit: 2013-11-30 | Discharge: 2013-12-01 | Disposition: A | Discharge status: Home / Self Care | Payer: Medicaid Other | Attending: Emergency Medicine | Admitting: Emergency Medicine

## 2013-11-30 ENCOUNTER — Encounter (HOSPITAL_COMMUNITY): Payer: Self-pay | Admitting: Emergency Medicine

## 2013-11-30 DIAGNOSIS — R1013 Epigastric pain: Secondary | ICD-10-CM

## 2013-11-30 DIAGNOSIS — I10 Essential (primary) hypertension: Secondary | ICD-10-CM | POA: Insufficient documentation

## 2013-11-30 DIAGNOSIS — K3184 Gastroparesis: Secondary | ICD-10-CM | POA: Insufficient documentation

## 2013-11-30 DIAGNOSIS — E669 Obesity, unspecified: Secondary | ICD-10-CM | POA: Insufficient documentation

## 2013-11-30 DIAGNOSIS — Z794 Long term (current) use of insulin: Secondary | ICD-10-CM | POA: Insufficient documentation

## 2013-11-30 DIAGNOSIS — K219 Gastro-esophageal reflux disease without esophagitis: Secondary | ICD-10-CM | POA: Insufficient documentation

## 2013-11-30 DIAGNOSIS — Z8739 Personal history of other diseases of the musculoskeletal system and connective tissue: Secondary | ICD-10-CM | POA: Insufficient documentation

## 2013-11-30 DIAGNOSIS — K5900 Constipation, unspecified: Secondary | ICD-10-CM | POA: Insufficient documentation

## 2013-11-30 DIAGNOSIS — R112 Nausea with vomiting, unspecified: Secondary | ICD-10-CM | POA: Insufficient documentation

## 2013-11-30 DIAGNOSIS — Z862 Personal history of diseases of the blood and blood-forming organs and certain disorders involving the immune mechanism: Secondary | ICD-10-CM | POA: Insufficient documentation

## 2013-11-30 DIAGNOSIS — K5289 Other specified noninfective gastroenteritis and colitis: Secondary | ICD-10-CM | POA: Insufficient documentation

## 2013-11-30 DIAGNOSIS — Z8639 Personal history of other endocrine, nutritional and metabolic disease: Secondary | ICD-10-CM | POA: Insufficient documentation

## 2013-11-30 DIAGNOSIS — G4730 Sleep apnea, unspecified: Secondary | ICD-10-CM | POA: Insufficient documentation

## 2013-11-30 DIAGNOSIS — Z9981 Dependence on supplemental oxygen: Secondary | ICD-10-CM | POA: Insufficient documentation

## 2013-11-30 DIAGNOSIS — Z79899 Other long term (current) drug therapy: Secondary | ICD-10-CM | POA: Insufficient documentation

## 2013-11-30 DIAGNOSIS — K259 Gastric ulcer, unspecified as acute or chronic, without hemorrhage or perforation: Secondary | ICD-10-CM | POA: Insufficient documentation

## 2013-11-30 DIAGNOSIS — E119 Type 2 diabetes mellitus without complications: Secondary | ICD-10-CM | POA: Insufficient documentation

## 2013-11-30 DIAGNOSIS — J45909 Unspecified asthma, uncomplicated: Secondary | ICD-10-CM | POA: Insufficient documentation

## 2013-11-30 DIAGNOSIS — Z8659 Personal history of other mental and behavioral disorders: Secondary | ICD-10-CM | POA: Insufficient documentation

## 2013-11-30 LAB — CBG MONITORING, ED: GLUCOSE-CAPILLARY: 256 mg/dL — AB (ref 70–99)

## 2013-11-30 MED ORDER — ONDANSETRON HCL 4 MG/2ML IJ SOLN
4.0000 mg | Freq: Once | INTRAMUSCULAR | Status: AC
  Administered 2013-11-30: 4 mg via INTRAVENOUS

## 2013-11-30 MED ORDER — METOCLOPRAMIDE HCL 5 MG/ML IJ SOLN
10.0000 mg | Freq: Once | INTRAMUSCULAR | Status: AC
  Administered 2013-12-01: 10 mg via INTRAVENOUS
  Filled 2013-11-30: qty 2

## 2013-11-30 MED ORDER — ONDANSETRON HCL 4 MG/2ML IJ SOLN
INTRAMUSCULAR | Status: AC
  Administered 2013-11-30: 4 mg via INTRAVENOUS
  Filled 2013-11-30: qty 2

## 2013-11-30 NOTE — ED Notes (Signed)
PER EMS: pt from home, reporting 10/10 abdominal pain x 2 days, worse today.  Also complaining of continuous hiccups. Was seen here and discharged 2 days ago and diagnosed with gastroparesis. EMS adm 4mg  zofran en route today. BP-178/82, NSR 80, CBG-209, RR-22. 99% RA.

## 2013-11-30 NOTE — ED Provider Notes (Signed)
CSN: [id number]     Arrival date & time 11/30/13  2317 History   First MD Initiated Contact with Patient 11/30/13 2340     Chief Complaint  Patient presents with  . Abdominal Pain     (Consider location/radiation/quality/duration/timing/severity/associated sxs/prior Treatment) HPI 43 year old male presents to emergency room with complaint of epigastric pain, nausea and vomiting throughout the day today.  He reports his blood sugars have been under control.  Patient discharged from the hospital on Monday.  He reports he was seen by his primary care Dr. the following day and told to go the ER because he could not manage him in the office.  Patient reports he has not been taking the Reglan as prescribed as he did not get a prescription upon discharge.  No fevers or chills.  Patient reports constipation which is chronic.  No blood in his emesis.  Patient reports continuous hiccups, none at this time. Past Medical History  Diagnosis Date  . Hypertension   . Neuropathy   . Anxiety   . Depression   . Sickle cell trait   . Gastroenteritis   . Obesity   . Constipation   . High cholesterol   . Asthma   . Chronic bronchitis     "get it q yr" (11/25/2013)  . Sleep apnea     "don't use CPAP" (11/25/2013)  . Type II diabetes mellitus   . GERD (gastroesophageal reflux disease)   . Stomach ulcer   . Fibromyalgia    Past Surgical History  Procedure Laterality Date  . Hip fracture surgery Right 1999    "put a plate in"   Family History  Problem Relation Age of Onset  . Diabetes Father   . Neuropathy Father   . Hypertension Father   . Sickle cell anemia Mother    History  Substance Use Topics  . Smoking status: Never Smoker   . Smokeless tobacco: Never Used  . Alcohol Use: No    Review of Systems   See History of Present Illness; otherwise all other systems are reviewed and negative  Allergies  Review of patient's allergies indicates no known allergies.  Home Medications    Prior to Admission medications   Medication Sig Start Date End Date Taking? Authorizing Kyiah Canepa  insulin aspart (NOVOLOG) 100 UNIT/ML injection Inject 10-20 Units into the skin 3 (three) times daily with meals. per sliding scale CBG 70 - 120: 0 units CBG 121 - 150: 2 units CBG 151 - 200: 3 units CBG 201 - 250: 5 units CBG 251 - 300: 8 units CBG 301 - 350: 11 units CBG 351 - 400: 15 units 11/26/13   Shanker Levora DredgeM Ghimire, MD  insulin detemir (LEVEMIR) 100 UNIT/ML injection Inject 0.4 mLs (40 Units total) into the skin at bedtime. 11/26/13   Shanker Levora DredgeM Ghimire, MD  insulin detemir (LEVEMIR) 100 UNIT/ML injection Inject 0.5 mLs (50 Units total) into the skin every morning. 11/26/13   Maretta BeesShanker M Ghimire, MD  lisinopril (PRINIVIL,ZESTRIL) 20 MG tablet Take 1 tablet (20 mg total) by mouth daily. 11/27/13   Shanker Levora DredgeM Ghimire, MD  metFORMIN (GLUCOPHAGE) 1000 MG tablet Take 1,000 mg by mouth 2 (two) times daily with a meal.    Historical Asucena Galer, MD  metoCLOPramide (REGLAN) 10 MG tablet Take 1 tablet (10 mg total) by mouth 3 (three) times daily before meals. 11/26/13   Shanker Levora DredgeM Ghimire, MD  omeprazole (PRILOSEC) 20 MG capsule Take 1 capsule (20 mg total) by mouth 2 (two)  times daily. 10/04/13   Rolland PorterMark James, MD  ondansetron (ZOFRAN) 4 MG tablet Take 1 tablet (4 mg total) by mouth every 6 (six) hours. 10/04/13   Rolland PorterMark James, MD  oxyCODONE (OXY IR/ROXICODONE) 5 MG immediate release tablet Take 1 tablet (5 mg total) by mouth every 4 (four) hours as needed for moderate pain. 11/26/13   Shanker Levora DredgeM Ghimire, MD  polyethylene glycol [facility](MIRALAX) packet Take 17 g by mouth daily. 11/18/13   Jerald KiefStephen K Chiu, MD  potassium chloride 20 MEQ TBCR Take 20 mEq by mouth daily. 11/26/13   Shanker Levora DredgeM Ghimire, MD  pregabalin (LYRICA) 75 MG capsule Take 75 mg by mouth 3 (three) times daily.    Historical Kelcey Korus, MD  promethazine (PHENERGAN) 25 MG tablet Take 25 mg by mouth every 8 (eight) hours as needed for nausea or vomiting.      Historical Dejon Jungman, MD   BP 166/99  Pulse 85  Temp(Src) 97.9 F (36.6 C) (Oral)  Resp 25  SpO2 100% Physical Exam  Nursing note and vitals reviewed. Constitutional: He is oriented to person, place, and time. He appears well-developed and well-nourished.  Obese male, uncomfortable appearing  HENT:  Head: Normocephalic and atraumatic.  Nose: Nose normal.  Mouth/Throat: Oropharynx is clear and moist.  Eyes: Conjunctivae and EOM are normal. Pupils are equal, round, and reactive to light.  Neck: Normal range of motion. Neck supple. No JVD present. No tracheal deviation present. No thyromegaly present.  Cardiovascular: Normal rate, regular rhythm, normal heart sounds and intact distal pulses.  Exam reveals no gallop and no friction rub.   No murmur heard. Pulmonary/Chest: Effort normal and breath sounds normal. No stridor. No respiratory distress. He has no wheezes. He has no rales. He exhibits no tenderness.  Abdominal: Soft. Bowel sounds are normal. He exhibits no distension and no mass. There is tenderness (tenderness in epigastrium). There is no rebound and no guarding.  Hyperactive bowel sounds  Musculoskeletal: Normal range of motion. He exhibits no edema and no tenderness.  Lymphadenopathy:    He has no cervical adenopathy.  Neurological: He is alert and oriented to person, place, and time. He exhibits normal muscle tone. Coordination normal.  Skin: Skin is warm and dry. No rash noted. No erythema. No pallor.  Psychiatric: He has a normal mood and affect. His behavior is normal. Judgment and thought content normal.    ED Course  Procedures (including critical care time) Labs Review Labs Reviewed  CBC WITH DIFFERENTIAL - Abnormal; Notable for the following:    Neutrophils Relative % 84 (*)    Neutro Abs 8.6 (*)    Lymphocytes Relative 11 (*)    All other components within normal limits  COMPREHENSIVE METABOLIC PANEL - Abnormal; Notable for the following:    Potassium 3.3 (*)     Glucose, Bld 254 (*)    Albumin 2.9 (*)    All other components within normal limits  URINALYSIS, ROUTINE W REFLEX MICROSCOPIC - Abnormal; Notable for the following:    Glucose, UA 250 (*)    Hgb urine dipstick TRACE (*)    Ketones, ur 15 (*)    Protein, ur >300 (*)    All other components within normal limits  URINE MICROSCOPIC-ADD ON - Abnormal; Notable for the following:    Squamous Epithelial / LPF FEW (*)    Casts HYALINE CASTS (*)    All other components within normal limits  CBG MONITORING, ED - Abnormal; Notable for the following:    Glucose-Capillary  256 (*)    All other components within normal limits  LIPASE, BLOOD    Imaging Review No results found.   EKG Interpretation None      MDM   Final diagnoses:  Gastroparesis  Epigastric pain  Nausea and vomiting, vomiting of unspecified type   43 year old male diabetic history of frequent DKA's presents with upper abdominal pain.  Patient recently diagnosed with gastroparesis, has not been on his Reglan per his report.  Patient complains of hiccups, none of this time.  Patient is tender in upper abdomen.  Plan to check labs give fluids Reglan and reassess.  Pt tolerating PO fluids, pain resolved.  Will refer back to pcm, restart reglan  Olivia Mackie, MD 12/01/13 [phone]

## 2013-12-01 LAB — URINALYSIS, ROUTINE W REFLEX MICROSCOPIC
Bilirubin Urine: NEGATIVE
Glucose, UA: 250 mg/dL — AB
Ketones, ur: 15 mg/dL — AB
LEUKOCYTES UA: NEGATIVE
NITRITE: NEGATIVE
Protein, ur: >300 mg/dL — AB
SPECIFIC GRAVITY, URINE: 1.015 (ref 1.005–1.030)
UROBILINOGEN UA: 0.2 mg/dL (ref 0.0–1.0)
pH: 8 (ref 5.0–8.0)

## 2013-12-01 LAB — CBC WITH DIFFERENTIAL/PLATELET
BASOS ABS: 0 10*3/uL (ref 0.0–0.1)
BASOS PCT: 0 % (ref 0–1)
EOS ABS: 0 10*3/uL (ref 0.0–0.7)
Eosinophils Relative: 0 % (ref 0–5)
HCT: 43.9 % (ref 39.0–52.0)
HEMOGLOBIN: 14.9 g/dL (ref 13.0–17.0)
Lymphocytes Relative: 11 % — ABNORMAL LOW (ref 12–46)
Lymphs Abs: 1.1 10*3/uL (ref 0.7–4.0)
MCH: 27.2 pg (ref 26.0–34.0)
MCHC: 33.9 g/dL (ref 30.0–36.0)
MCV: 80.1 fL (ref 78.0–100.0)
MONOS PCT: 5 % (ref 3–12)
Monocytes Absolute: 0.5 10*3/uL (ref 0.1–1.0)
NEUTROS ABS: 8.6 10*3/uL — AB (ref 1.7–7.7)
NEUTROS PCT: 84 % — AB (ref 43–77)
PLATELETS: 237 10*3/uL (ref 150–400)
RBC: 5.48 MIL/uL (ref 4.22–5.81)
RDW: 14.5 % (ref 11.5–15.5)
WBC: 10.2 10*3/uL (ref 4.0–10.5)

## 2013-12-01 LAB — COMPREHENSIVE METABOLIC PANEL
ALBUMIN: 2.9 g/dL — AB (ref 3.5–5.2)
ALK PHOS: 90 U/L (ref 39–117)
ALT: 15 U/L (ref 0–53)
AST: 14 U/L (ref 0–37)
BILIRUBIN TOTAL: 0.3 mg/dL (ref 0.3–1.2)
BUN: 6 mg/dL (ref 6–23)
CHLORIDE: 96 meq/L (ref 96–112)
CO2: 24 mEq/L (ref 19–32)
Calcium: 9.4 mg/dL (ref 8.4–10.5)
Creatinine, Ser: 0.95 mg/dL (ref 0.50–1.35)
GFR calc Af Amer: >90 mL/min (ref >90)
GFR calc non Af Amer: >90 mL/min (ref >90)
Glucose, Bld: 254 mg/dL — ABNORMAL HIGH (ref 70–99)
POTASSIUM: 3.3 meq/L — AB (ref 3.7–5.3)
SODIUM: 137 meq/L (ref 137–147)
TOTAL PROTEIN: 7.1 g/dL (ref 6.0–8.3)

## 2013-12-01 LAB — URINE MICROSCOPIC-ADD ON

## 2013-12-01 LAB — LIPASE, BLOOD: Lipase: 44 U/L (ref 11–59)

## 2013-12-01 MED ORDER — METOCLOPRAMIDE HCL 10 MG PO TABS: 10.0000 mg | ORAL_TABLET | Freq: Three times a day (TID) | ORAL | Status: DC

## 2013-12-01 MED ORDER — SODIUM CHLORIDE 0.9 % IV BOLUS (SEPSIS)
1000.0000 mL | Freq: Once | INTRAVENOUS | Status: AC
  Administered 2013-12-01: 1000 mL via INTRAVENOUS

## 2013-12-01 MED ORDER — FENTANYL CITRATE 0.05 MG/ML IJ SOLN
100.0000 ug | Freq: Once | INTRAMUSCULAR | Status: AC
  Administered 2013-12-01: 100 ug via INTRAVENOUS
  Filled 2013-12-01 (×2): qty 2

## 2013-12-01 MED ORDER — OMEPRAZOLE 20 MG PO CPDR: 20.0000 mg | DELAYED_RELEASE_CAPSULE | Freq: Two times a day (BID) | ORAL | Status: DC

## 2013-12-01 NOTE — Discharge Instructions (Signed)

## 2013-12-01 NOTE — ED Notes (Signed)
Pt given cup of ginger ale per fluid order challenge. Will re-assess shortly.

## 2013-12-01 NOTE — ED Notes (Signed)
Pt able to drink some of ginger ale. Denies nausea and vomiting.

## 2013-12-01 NOTE — ED Notes (Addendum)
Pt A&OX4, ambulatory at d/c with steady gait but wheeled out of ED via wheelchair, NAD 

## 2013-12-01 NOTE — ED Notes (Signed)
CBG 256  °

## 2013-12-02 ENCOUNTER — Encounter (HOSPITAL_COMMUNITY): Payer: Self-pay | Admitting: Emergency Medicine

## 2013-12-02 ENCOUNTER — Inpatient Hospital Stay (HOSPITAL_COMMUNITY)
Admission: EM | Admit: 2013-12-02 | Discharge: 2013-12-05 | DRG: 683 | Disposition: A | Discharge status: Home / Self Care | Payer: Medicaid Other | Attending: Internal Medicine | Admitting: Internal Medicine

## 2013-12-02 DIAGNOSIS — E669 Obesity, unspecified: Secondary | ICD-10-CM

## 2013-12-02 DIAGNOSIS — Z832 Family history of diseases of the blood and blood-forming organs and certain disorders involving the immune mechanism: Secondary | ICD-10-CM

## 2013-12-02 DIAGNOSIS — G8929 Other chronic pain: Secondary | ICD-10-CM | POA: Diagnosis present

## 2013-12-02 DIAGNOSIS — E0865 Diabetes mellitus due to underlying condition with hyperglycemia: Secondary | ICD-10-CM

## 2013-12-02 DIAGNOSIS — N189 Chronic kidney disease, unspecified: Secondary | ICD-10-CM | POA: Diagnosis present

## 2013-12-02 DIAGNOSIS — K3184 Gastroparesis: Secondary | ICD-10-CM | POA: Diagnosis present

## 2013-12-02 DIAGNOSIS — R109 Unspecified abdominal pain: Secondary | ICD-10-CM

## 2013-12-02 DIAGNOSIS — J45909 Unspecified asthma, uncomplicated: Secondary | ICD-10-CM | POA: Diagnosis present

## 2013-12-02 DIAGNOSIS — IMO0001 Reserved for inherently not codable concepts without codable children: Secondary | ICD-10-CM | POA: Diagnosis present

## 2013-12-02 DIAGNOSIS — Z794 Long term (current) use of insulin: Secondary | ICD-10-CM

## 2013-12-02 DIAGNOSIS — R197 Diarrhea, unspecified: Secondary | ICD-10-CM

## 2013-12-02 DIAGNOSIS — I129 Hypertensive chronic kidney disease with stage 1 through stage 4 chronic kidney disease, or unspecified chronic kidney disease: Secondary | ICD-10-CM | POA: Diagnosis present

## 2013-12-02 DIAGNOSIS — D573 Sickle-cell trait: Secondary | ICD-10-CM | POA: Diagnosis present

## 2013-12-02 DIAGNOSIS — R739 Hyperglycemia, unspecified: Secondary | ICD-10-CM

## 2013-12-02 DIAGNOSIS — T464X5A Adverse effect of angiotensin-converting-enzyme inhibitors, initial encounter: Secondary | ICD-10-CM

## 2013-12-02 DIAGNOSIS — R9431 Abnormal electrocardiogram [ECG] [EKG]: Secondary | ICD-10-CM

## 2013-12-02 DIAGNOSIS — Z79899 Other long term (current) drug therapy: Secondary | ICD-10-CM

## 2013-12-02 DIAGNOSIS — G4733 Obstructive sleep apnea (adult) (pediatric): Secondary | ICD-10-CM | POA: Diagnosis present

## 2013-12-02 DIAGNOSIS — Z833 Family history of diabetes mellitus: Secondary | ICD-10-CM

## 2013-12-02 DIAGNOSIS — E1143 Type 2 diabetes mellitus with diabetic autonomic (poly)neuropathy: Secondary | ICD-10-CM

## 2013-12-02 DIAGNOSIS — E860 Dehydration: Secondary | ICD-10-CM | POA: Diagnosis present

## 2013-12-02 DIAGNOSIS — T46905A Adverse effect of unspecified agents primarily affecting the cardiovascular system, initial encounter: Secondary | ICD-10-CM | POA: Diagnosis present

## 2013-12-02 DIAGNOSIS — G4730 Sleep apnea, unspecified: Secondary | ICD-10-CM

## 2013-12-02 DIAGNOSIS — N179 Acute kidney failure, unspecified: Secondary | ICD-10-CM | POA: Diagnosis present

## 2013-12-02 DIAGNOSIS — E1149 Type 2 diabetes mellitus with other diabetic neurological complication: Secondary | ICD-10-CM | POA: Diagnosis present

## 2013-12-02 DIAGNOSIS — Z9989 Dependence on other enabling machines and devices: Secondary | ICD-10-CM

## 2013-12-02 DIAGNOSIS — Z6841 Body Mass Index (BMI) 40.0 and over, adult: Secondary | ICD-10-CM

## 2013-12-02 DIAGNOSIS — E662 Morbid (severe) obesity with alveolar hypoventilation: Secondary | ICD-10-CM

## 2013-12-02 DIAGNOSIS — E876 Hypokalemia: Secondary | ICD-10-CM

## 2013-12-02 NOTE — ED Notes (Signed)
Been off lyrica x 2 weeks; normal dose 300 mg/day.  Took  75 mg today around lunch and is feeling horrible.

## 2013-12-02 NOTE — ED Notes (Signed)
Comes via ems with h/a, n/v. VSS.

## 2013-12-02 NOTE — ED Notes (Addendum)
Pt. Started taking lyrica 300 mg after being off it today for x 2 weeks. Having h/a's, dizziness, and falling. Neg. Stroke scale.

## 2013-12-03 ENCOUNTER — Emergency Department (HOSPITAL_COMMUNITY): Payer: Medicaid Other

## 2013-12-03 DIAGNOSIS — T465X5A Adverse effect of other antihypertensive drugs, initial encounter: Secondary | ICD-10-CM

## 2013-12-03 DIAGNOSIS — T464X5A Adverse effect of angiotensin-converting-enzyme inhibitors, initial encounter: Secondary | ICD-10-CM

## 2013-12-03 DIAGNOSIS — E1369 Other specified diabetes mellitus with other specified complication: Secondary | ICD-10-CM

## 2013-12-03 DIAGNOSIS — N179 Acute kidney failure, unspecified: Principal | ICD-10-CM

## 2013-12-03 DIAGNOSIS — G4733 Obstructive sleep apnea (adult) (pediatric): Secondary | ICD-10-CM

## 2013-12-03 DIAGNOSIS — N189 Chronic kidney disease, unspecified: Secondary | ICD-10-CM

## 2013-12-03 LAB — GLUCOSE, CAPILLARY
GLUCOSE-CAPILLARY: 235 mg/dL — AB (ref 70–99)
GLUCOSE-CAPILLARY: 304 mg/dL — AB (ref 70–99)
Glucose-Capillary: 249 mg/dL — ABNORMAL HIGH (ref 70–99)
Glucose-Capillary: 222 mg/dL — ABNORMAL HIGH (ref 70–99)

## 2013-12-03 LAB — CBC
HCT: 44.1 % (ref 39.0–52.0)
HEMATOCRIT: 39.6 % (ref 39.0–52.0)
HEMOGLOBIN: 13.1 g/dL (ref 13.0–17.0)
HEMOGLOBIN: 14.5 g/dL (ref 13.0–17.0)
MCH: 27.1 pg (ref 26.0–34.0)
MCH: 27.3 pg (ref 26.0–34.0)
MCHC: 32.9 g/dL (ref 30.0–36.0)
MCHC: 33.1 g/dL (ref 30.0–36.0)
MCV: 82.3 fL (ref 78.0–100.0)
MCV: 82.5 fL (ref 78.0–100.0)
Platelets: 225 10*3/uL (ref 150–400)
Platelets: 217 10*3/uL (ref 150–400)
RBC: 5.36 MIL/uL (ref 4.22–5.81)
RBC: 4.8 MIL/uL (ref 4.22–5.81)
RDW: 15 % (ref 11.5–15.5)
RDW: 15.1 % (ref 11.5–15.5)
WBC: 10.6 10*3/uL — ABNORMAL HIGH (ref 4.0–10.5)
WBC: 10 10*3/uL (ref 4.0–10.5)

## 2013-12-03 LAB — CREATININE, SERUM
Creatinine, Ser: 2.9 mg/dL — ABNORMAL HIGH (ref 0.50–1.35)
GFR calc Af Amer: 29 mL/min — ABNORMAL LOW (ref >90)
GFR calc non Af Amer: 25 mL/min — ABNORMAL LOW (ref >90)

## 2013-12-03 LAB — I-STAT CHEM 8, ED
BUN: 11 mg/dL (ref 6–23)
CALCIUM ION: 1.18 mmol/L (ref 1.12–1.23)
Chloride: 96 mEq/L (ref 96–112)
Creatinine, Ser: 2.9 mg/dL — ABNORMAL HIGH (ref 0.50–1.35)
Glucose, Bld: 206 mg/dL — ABNORMAL HIGH (ref 70–99)
HEMATOCRIT: 49 % (ref 39.0–52.0)
HEMOGLOBIN: 16.7 g/dL (ref 13.0–17.0)
Potassium: 3.2 mEq/L — ABNORMAL LOW (ref 3.7–5.3)
Sodium: 139 mEq/L (ref 137–147)
TCO2: 26 mmol/L (ref 0–100)

## 2013-12-03 LAB — BASIC METABOLIC PANEL
BUN: 12 mg/dL (ref 6–23)
CALCIUM: 9 mg/dL (ref 8.4–10.5)
CO2: 27 mEq/L (ref 19–32)
CREATININE: 2.65 mg/dL — AB (ref 0.50–1.35)
Chloride: 95 mEq/L — ABNORMAL LOW (ref 96–112)
GFR calc non Af Amer: 28 mL/min — ABNORMAL LOW (ref >90)
GFR, EST AFRICAN AMERICAN: 32 mL/min — AB (ref >90)
Glucose, Bld: 195 mg/dL — ABNORMAL HIGH (ref 70–99)
Potassium: 3.2 mEq/L — ABNORMAL LOW (ref 3.7–5.3)
Sodium: 137 mEq/L (ref 137–147)

## 2013-12-03 LAB — I-STAT VENOUS BLOOD GAS, ED
Bicarbonate: 27.7 mEq/L — ABNORMAL HIGH (ref 20.0–24.0)
O2 Saturation: 77 %
PH VEN: 7.321 — AB (ref 7.250–7.300)
TCO2: 29 mmol/L (ref 0–100)
pCO2, Ven: 53.6 mmHg — ABNORMAL HIGH (ref 45.0–50.0)
pO2, Ven: 46 mmHg — ABNORMAL HIGH (ref 30.0–45.0)

## 2013-12-03 MED ORDER — PREGABALIN 75 MG PO CAPS
75.0000 mg | ORAL_CAPSULE | Freq: Three times a day (TID) | ORAL | Status: DC
  Administered 2013-12-03 – 2013-12-05 (×7): 75 mg via ORAL
  Filled 2013-12-03 (×7): qty 1

## 2013-12-03 MED ORDER — ONDANSETRON HCL 4 MG/2ML IJ SOLN: 4.0000 mg | Freq: Three times a day (TID) | INTRAMUSCULAR | Status: DC | PRN

## 2013-12-03 MED ORDER — INSULIN ASPART 100 UNIT/ML ~~LOC~~ SOLN: 0.0000 [IU] | Freq: Three times a day (TID) | SUBCUTANEOUS | Status: DC

## 2013-12-03 MED ORDER — METOCLOPRAMIDE HCL 10 MG PO TABS
10.0000 mg | ORAL_TABLET | Freq: Three times a day (TID) | ORAL | Status: DC
  Administered 2013-12-03 – 2013-12-05 (×6): 10 mg via ORAL
  Filled 2013-12-03 (×13): qty 1

## 2013-12-03 MED ORDER — PANTOPRAZOLE SODIUM 40 MG PO TBEC
40.0000 mg | DELAYED_RELEASE_TABLET | Freq: Every day | ORAL | Status: DC
  Administered 2013-12-03 – 2013-12-05 (×3): 40 mg via ORAL
  Filled 2013-12-03 (×3): qty 1

## 2013-12-03 MED ORDER — INSULIN DETEMIR 100 UNIT/ML ~~LOC~~ SOLN
50.0000 [IU] | Freq: Every morning | SUBCUTANEOUS | Status: DC
  Administered 2013-12-03: 50 [IU] via SUBCUTANEOUS
  Filled 2013-12-03 (×2): qty 0.5

## 2013-12-03 MED ORDER — INSULIN ASPART 100 UNIT/ML ~~LOC~~ SOLN
0.0000 [IU] | Freq: Three times a day (TID) | SUBCUTANEOUS | Status: DC
  Administered 2013-12-03: 11 [IU] via SUBCUTANEOUS
  Administered 2013-12-03 (×2): 5 [IU] via SUBCUTANEOUS
  Administered 2013-12-04: 8 [IU] via SUBCUTANEOUS
  Administered 2013-12-04: 15 [IU] via SUBCUTANEOUS
  Administered 2013-12-04 – 2013-12-05 (×2): 5 [IU] via SUBCUTANEOUS

## 2013-12-03 MED ORDER — PROMETHAZINE HCL 25 MG PO TABS: 25.0000 mg | ORAL_TABLET | Freq: Three times a day (TID) | ORAL | Status: DC | PRN

## 2013-12-03 MED ORDER — POTASSIUM CHLORIDE IN NACL 20-0.9 MEQ/L-% IV SOLN
INTRAVENOUS | Status: DC
  Administered 2013-12-03: 12:00:00 via INTRAVENOUS
  Administered 2013-12-03: 100 mL/h via INTRAVENOUS
  Filled 2013-12-03 (×5): qty 1000

## 2013-12-03 MED ORDER — HEPARIN SODIUM (PORCINE) 5000 UNIT/ML IJ SOLN
5000.0000 [IU] | Freq: Three times a day (TID) | INTRAMUSCULAR | Status: DC
  Administered 2013-12-03 – 2013-12-04 (×3): 5000 [IU] via SUBCUTANEOUS
  Filled 2013-12-03 (×6): qty 1

## 2013-12-03 MED ORDER — OXYCODONE HCL 5 MG PO TABS
5.0000 mg | ORAL_TABLET | ORAL | Status: DC | PRN
  Administered 2013-12-04 – 2013-12-05 (×2): 5 mg via ORAL
  Filled 2013-12-03 (×2): qty 1

## 2013-12-03 MED ORDER — POLYETHYLENE GLYCOL 3350 17 G PO PACK
17.0000 g | PACK | Freq: Every day | ORAL | Status: DC
  Administered 2013-12-04 – 2013-12-05 (×2): 17 g via ORAL
  Filled 2013-12-03 (×3): qty 1

## 2013-12-03 NOTE — Progress Notes (Signed)
New Admission Note:   Arrival Method: per stretcher from ED with NT Ken Mental Orientation: alert and oriented to self, disoriented to time, place and situation, extremely drowsy Telemetry: placed on box 19 after [facility] [employer] has been notified Assessment: Completed Skin: warm, dry, no wounds noted IV: on the right antecubital, clean, dry and intact with transparent dressing Pain: denies having pain as of this time Tubes: oxygen nasal cannula, pulse oximetry Safety Measures: Safety Fall Prevention Plan has been given, discussed but patient too drowsy to focus and sign Admission: Completed [address] Orientation: Patient has been orientated to the room, unit and staff.  Family: no family member present at bedside  Orders have been reviewed and implemented. Will continue to monitor the patient. Call light has been placed within reach and bed alarm has been activated.   Janice NorrieAnessa Macrohon BSN, RN [facility] [address] [place] [phone]

## 2013-12-03 NOTE — ED Provider Notes (Signed)
CSN: [id number]     Arrival date & time 12/02/13  2329 History   First MD Initiated Contact with Patient 12/03/13 0209     Chief Complaint  Patient presents with  . Headache  . Dizziness     (Consider location/radiation/quality/duration/timing/severity/associated sxs/prior Treatment) HPI Patient is a 43 yo man with multiple chronic medical problems including HTN, neuropathy, anxiety, depression, obesity, asthma, chronic anxiety and depression, OSA, fibromyalgia and DM.   He comes into today with complaints of diffuse pressure like sensation in his head which, he says, is minor in severity. He has had positional vertigo. He has no history of similar sx. These sx have resolved without intervention. The patient went to sleep and awoke with a panic attack. He then vomited once and asked his wife to call EMS. The patient was noted to have pulse ox of 86% when he arrived in the ED and was placed on 2L supplemental oxygen.   Patient denies CP, cough, fever. He says he has had low oxygen sats before and that this classically improves with positional changes.   Only new meds are reglan, omeprazole and miralax.   Past Medical History  Diagnosis Date  . Hypertension   . Neuropathy   . Anxiety   . Depression   . Sickle cell trait   . Gastroenteritis   . Obesity   . Constipation   . High cholesterol   . Asthma   . Chronic bronchitis     "get it q yr" (11/25/2013)  . Sleep apnea     "don't use CPAP" (11/25/2013)  . Type II diabetes mellitus   . GERD (gastroesophageal reflux disease)   . Stomach ulcer   . Fibromyalgia    Past Surgical History  Procedure Laterality Date  . Hip fracture surgery Right 1999    "put a plate in"   Family History  Problem Relation Age of Onset  . Diabetes Father   . Neuropathy Father   . Hypertension Father   . Sickle cell anemia Mother    History  Substance Use Topics  . Smoking status: Never Smoker   . Smokeless tobacco: Never Used  . Alcohol Use:  No    Review of Systems Ten point review of symptoms performed and is negative with the exception of symptoms noted above.     Allergies  Review of patient's allergies indicates no known allergies.  Home Medications   Prior to Admission medications   Medication Sig Start Date End Date Taking? Authorizing Marella Vanderpol  insulin aspart (NOVOLOG) 100 UNIT/ML injection Inject 10-20 Units into the skin 3 (three) times daily with meals. per sliding scale CBG 70 - 120: 0 units CBG 121 - 150: 2 units CBG 151 - 200: 3 units CBG 201 - 250: 5 units CBG 251 - 300: 8 units CBG 301 - 350: 11 units CBG 351 - 400: 15 units 11/26/13  Yes Shanker Levora DredgeM Ghimire, MD  insulin detemir (LEVEMIR) 100 UNIT/ML injection Inject 0.5 mLs (50 Units total) into the skin every morning. 11/26/13  Yes Shanker Levora DredgeM Ghimire, MD  lisinopril (PRINIVIL,ZESTRIL) 20 MG tablet Take 1 tablet (20 mg total) by mouth daily. 11/27/13  Yes Shanker Levora DredgeM Ghimire, MD  metFORMIN (GLUCOPHAGE) 1000 MG tablet Take 1,000 mg by mouth 2 (two) times daily with a meal.   Yes Historical Tammie Yanda, MD  metoCLOPramide (REGLAN) 10 MG tablet Take 1 tablet (10 mg total) by mouth 3 (three) times daily before meals. 12/01/13  Yes Olivia Mackielga M Otter, MD  omeprazole (PRILOSEC) 20 MG capsule Take 1 capsule (20 mg total) by mouth 2 (two) times daily. 12/01/13  Yes Olivia Mackie, MD  oxyCODONE (OXY IR/ROXICODONE) 5 MG immediate release tablet Take 1 tablet (5 mg total) by mouth every 4 (four) hours as needed for moderate pain. 11/26/13  Yes Shanker Levora Dredge, MD  polyethylene glycol [facility]) packet Take 17 g by mouth daily. 11/18/13  Yes Jerald Kief, MD  potassium chloride 20 MEQ TBCR Take 20 mEq by mouth daily. 11/26/13  Yes Shanker Levora Dredge, MD  pregabalin (LYRICA) 75 MG capsule Take 75 mg by mouth 3 (three) times daily.   Yes Historical Amparo Donalson, MD  promethazine (PHENERGAN) 25 MG tablet Take 25 mg by mouth every 8 (eight) hours as needed for nausea or vomiting.    Yes  Historical Vernal Hritz, MD   BP 107/78  Pulse 91  Temp(Src) 97.8 F (36.6 C) (Oral)  Resp 14  Ht 5\' 11"  (1.803 m)  Wt 330 lb (149.687 kg)  BMI 46.05 kg/m2  SpO2 86% Physical Exam Gen: well developed and well nourished appearing Head: NCAT Eyes: PERL, EOMI Nose: no epistaixis or rhinorrhea Mouth/throat: mucosa is moist and pink Neck: supple, no stridor Lungs: CTA B, no wheezing, rhonchi or rales CV: RRR, no murmur, extremities appear well perfused.  Abd: soft, morbidly obese notender, nondistended Back: no ttp, no cva ttp Skin: warm and dry Ext: normal to inspection, no dependent edema Neuro: CN ii-xii grossly intact, no focal deficits, normal speech, normal finger to nose, Psyche; normal affect,  calm and cooperative.   ED Course  Procedures (including critical care time) Labs Review  Results for orders placed during the hospital encounter of 12/02/13 (from the past 24 hour(s))  CBC     Status: Abnormal   Collection Time    12/03/13 12:54 AM      Result Value Ref Range   WBC 10.6 (*) 4.0 - 10.5 K/uL   RBC 5.36  4.22 - 5.81 MIL/uL   Hemoglobin 14.5  13.0 - 17.0 g/dL   HCT 02.7  25.3 - 66.4 %   MCV 82.3  78.0 - 100.0 fL   MCH 27.1  26.0 - 34.0 pg   MCHC 32.9  30.0 - 36.0 g/dL   RDW 40.3  47.4 - 25.9 %   Platelets 225  150 - 400 K/uL  I-STAT CHEM 8, ED     Status: Abnormal   Collection Time    12/03/13 12:59 AM      Result Value Ref Range   Sodium 139  137 - 147 mEq/L   Potassium 3.2 (*) 3.7 - 5.3 mEq/L   Chloride 96  96 - 112 mEq/L   BUN 11  6 - 23 mg/dL   Creatinine, Ser 5.63 (*) 0.50 - 1.35 mg/dL   Glucose, Bld 875 (*) 70 - 99 mg/dL   Calcium, Ion 6.43  3.29 - 1.23 mmol/L   TCO2 26  0 - 100 mmol/L   Hemoglobin 16.7  13.0 - 17.0 g/dL   HCT 51.8  84.1 - 66.0 %  BASIC METABOLIC PANEL     Status: Abnormal   Collection Time    12/03/13  2:43 AM      Result Value Ref Range   Sodium 137  137 - 147 mEq/L   Potassium 3.2 (*) 3.7 - 5.3 mEq/L   Chloride 95 (*) 96 -  112 mEq/L   CO2 27  19 - 32 mEq/L   Glucose, Bld  195 (*) 70 - 99 mg/dL   BUN 12  6 - 23 mg/dL   Creatinine, Ser 1.612.65 (*) 0.50 - 1.35 mg/dL   Calcium 9.0  8.4 - 09.610.5 mg/dL   GFR calc non Af Amer 28 (*) >90 mL/min   GFR calc Af Amer 32 (*) >90 mL/min  I-STAT VENOUS BLOOD GAS, ED     Status: Abnormal   Collection Time    12/03/13  3:04 AM      Result Value Ref Range   pH, Ven 7.321 (*) 7.250 - 7.300   pCO2, Ven 53.6 (*) 45.0 - 50.0 mmHg   pO2, Ven 46.0 (*) 30.0 - 45.0 mmHg   Bicarbonate 27.7 (*) 20.0 - 24.0 mEq/L   TCO2 29  0 - 100 mmol/L   O2 Saturation 77.0     Sample type VENOUS      DG Chest Port 1 View (Final result)  Result time: 12/03/13 04:09:25    [facility]       Final result by Rad Results In Interface (12/03/13 04:09:25)    Narrative:   CLINICAL DATA: Headache and dizziness.  EXAM: PORTABLE CHEST - 1 VIEW  COMPARISON: Chest radiograph November 24, 2013  FINDINGS: The cardiac silhouette remains similarly enlarged, mediastinal silhouette is nonsuspicious. No pleural effusions or focal consolidations for this low inspiratory portable examination with crowded vasculature markings. Trachea projects midline and there is no pneumothorax. Multiple EKG lines overlie the patient and may obscure subtle underlying pathology. Soft tissue planes and included osseous structures are nonsuspicious.  IMPRESSION: Stable cardiomegaly, no acute pulmonary process for this low inspiratory portable examination.   Electronically Signed By: Awilda Metroourtnay Bloomer On: 12/03/2013 04:09   EKG: nsr, no acute ischemic changes, normal intervals, normal axis, normal qrs complex  MDM   Patient has OSA and OHS. His sats are low when recumbent but, normal when sitting upright. I believe he awoke at night with SOB secondary to OSA. He says this has happened before. VBG is notable for some acute on chronic respiratory acidosis. CXR is unremarkable. We are  treating with 02 in ED. Patient found to have acute kidney injury without identifiable source at this point. Case disccused with Dr. Lanae BoastGarner who will see and admit.     Brandt LoosenJulie Manly, MD 12/03/13 [phone]

## 2013-12-03 NOTE — Progress Notes (Signed)
Patient set-up on CPAP for HS.  Patient was initially reluctant to wear CPAP.  Auto-titration mode used (min 5, max 20).  FFM used.  2L oxygen bleed in.  Patient was educated on equipment and procedure.

## 2013-12-03 NOTE — H&P (Signed)
[facility] History and Physical  [name]:[id number] DOB: 01-11-1971 DOA: 12/02/2013  Referring physician: EDP PCP: Pcp Not In System   Chief Complaint: Headache and dizziness   HPI: Kenneth Fox is a 43 y.o. male with DM2, HTN, OSA, he presents to the ED with diffuse pressure like sensation in his head.  Minor in severity.  Patient went to sleep after taking lyrica today (which he hadnt started in a while), awoke with a panic attack, 1 episode of vomiting and asked wife to call EMS.  Had pulse ox of 86% on arrival which improved to 99% on 2L via [place].  Work up in the ED was remarkable for new onset AKI, creatinine 2.65, this is a marked increase from 0.95 just a couple of days ago on 6/25.  Upon further review, it turns out the patient has just started taking lisinopril to treat HTN over the past 1-2 days or so he states (prescribed this at discharge last time but didn't start it till now).  Review of Systems: Systems reviewed.  As above, otherwise negative  Past Medical History  Diagnosis Date  . Hypertension   . Neuropathy   . Anxiety   . Depression   . Sickle cell trait   . Gastroenteritis   . Obesity   . Constipation   . High cholesterol   . Asthma   . Chronic bronchitis     "get it q yr" (11/25/2013)  . Sleep apnea     "don't use CPAP" (11/25/2013)  . Type II diabetes mellitus   . GERD (gastroesophageal reflux disease)   . Stomach ulcer   . Fibromyalgia    Past Surgical History  Procedure Laterality Date  . Hip fracture surgery Right 1999    "put a plate in"   Social History:  reports that he has never smoked. He has never used smokeless tobacco. He reports that he does not drink alcohol or use illicit drugs.  Allergies  Allergen Reactions  . Lisinopril Other (See Comments)    Patient developed AKI after being on lisinopril for a week.    Family History  Problem Relation Age of Onset  . Diabetes Father   . Neuropathy Father   .  Hypertension Father   . Sickle cell anemia Mother      Prior to Admission medications   Medication Sig Start Date End Date Taking? Authorizing Shelley Cocke  insulin aspart (NOVOLOG) 100 UNIT/ML injection Inject 10-20 Units into the skin 3 (three) times daily with meals. per sliding scale CBG 70 - 120: 0 units CBG 121 - 150: 2 units CBG 151 - 200: 3 units CBG 201 - 250: 5 units CBG 251 - 300: 8 units CBG 301 - 350: 11 units CBG 351 - 400: 15 units 11/26/13  Yes Shanker Levora Dredge, MD  insulin detemir (LEVEMIR) 100 UNIT/ML injection Inject 0.5 mLs (50 Units total) into the skin every morning. 11/26/13  Yes Shanker Levora Dredge, MD  lisinopril (PRINIVIL,ZESTRIL) 20 MG tablet Take 1 tablet (20 mg total) by mouth daily. 11/27/13  Yes Shanker Levora Dredge, MD  metFORMIN (GLUCOPHAGE) 1000 MG tablet Take 1,000 mg by mouth 2 (two) times daily with a meal.   Yes Historical Ein Rijo, MD  metoCLOPramide (REGLAN) 10 MG tablet Take 1 tablet (10 mg total) by mouth 3 (three) times daily before meals. 12/01/13  Yes Olivia Mackie, MD  omeprazole (PRILOSEC) 20 MG capsule Take 1 capsule (20 mg total) by mouth 2 (two) times  daily. 12/01/13  Yes Olivia Mackielga M Otter, MD  oxyCODONE (OXY IR/ROXICODONE) 5 MG immediate release tablet Take 1 tablet (5 mg total) by mouth every 4 (four) hours as needed for moderate pain. 11/26/13  Yes Shanker Levora DredgeM Ghimire, MD  polyethylene glycol [facility](MIRALAX) packet Take 17 g by mouth daily. 11/18/13  Yes Jerald KiefStephen K Chiu, MD  potassium chloride 20 MEQ TBCR Take 20 mEq by mouth daily. 11/26/13  Yes Shanker Levora DredgeM Ghimire, MD  pregabalin (LYRICA) 75 MG capsule Take 75 mg by mouth 3 (three) times daily.   Yes Historical Pixie Burgener, MD  promethazine (PHENERGAN) 25 MG tablet Take 25 mg by mouth every 8 (eight) hours as needed for nausea or vomiting.    Yes Historical Alcides Nutting, MD   Physical Exam: Filed Vitals:   12/03/13 0430  BP: 136/77  Pulse: 91  Temp:   Resp: 13    BP 136/77  Pulse 91  Temp(Src) 97.8 F (36.6 C)  (Oral)  Resp 13  Ht 5\' 11"  (1.803 m)  Wt 149.687 kg (330 lb)  BMI 46.05 kg/m2  SpO2 96%  General Appearance:    Alert, oriented, no distress, appears stated age  Head:    Normocephalic, atraumatic  Eyes:    PERRL, EOMI, sclera non-icteric        Nose:   Nares without drainage or epistaxis. Mucosa, turbinates normal  Throat:   Moist mucous membranes. Oropharynx without erythema or exudate.  Neck:   Supple. No carotid bruits.  No thyromegaly.  No lymphadenopathy.   Back:     No CVA tenderness, no spinal tenderness  Lungs:     Clear to auscultation bilaterally, without wheezes, rhonchi or rales  Chest wall:    No tenderness to palpitation  Heart:    Regular rate and rhythm without murmurs, gallops, rubs  Abdomen:     Soft, non-tender, nondistended, normal bowel sounds, no organomegaly  Genitalia:    deferred  Rectal:    deferred  Extremities:   No clubbing, cyanosis or edema.  Pulses:   2+ and symmetric all extremities  Skin:   Skin color, texture, turgor normal, no rashes or lesions  Lymph nodes:   Cervical, supraclavicular, and axillary nodes normal  Neurologic:   CNII-XII intact. Normal strength, sensation and reflexes      throughout    Labs on Admission:  Basic Metabolic Panel:  Recent Labs Lab 11/26/13 1239 11/30/13 2359 12/03/13 0059 12/03/13 0243  NA 140 137 139 137  K 3.5* 3.3* 3.2* 3.2*  CL 99 96 96 95*  CO2 29 24  --  27  GLUCOSE 145* 254* 206* 195*  BUN 8 6 11 12   CREATININE 1.03 0.95 2.90* 2.65*  CALCIUM 9.4 9.4  --  9.0   Liver Function Tests:  Recent Labs Lab 11/30/13 2359  AST 14  ALT 15  ALKPHOS 90  BILITOT 0.3  PROT 7.1  ALBUMIN 2.9*    Recent Labs Lab 11/30/13 2359  LIPASE 44   No results found for this basename: AMMONIA,  in the last 168 hours CBC:  Recent Labs Lab 11/30/13 2359 12/03/13 0054 12/03/13 0059  WBC 10.2 10.6*  --   NEUTROABS 8.6*  --   --   HGB 14.9 14.5 16.7  HCT 43.9 44.1 49.0  MCV 80.1 82.3  --   PLT 237  225  --    Cardiac Enzymes: No results found for this basename: CKTOTAL, CKMB, CKMBINDEX, TROPONINI,  in the last 168 hours  BNP (last  3 results)  Recent Labs  11/24/13 2012  PROBNP 72.7   CBG:  Recent Labs Lab 11/26/13 0751 11/26/13 1154 12/01/13  GLUCAP 133* 156* 256*    Radiological Exams on Admission: Dg Chest Port 1 View  12/03/2013   CLINICAL DATA:  Headache and dizziness.  EXAM: PORTABLE CHEST - 1 VIEW  COMPARISON:  Chest radiograph November 24, 2013  FINDINGS: The cardiac silhouette remains similarly enlarged, mediastinal silhouette is nonsuspicious. No pleural effusions or focal consolidations for this low inspiratory portable examination with crowded vasculature markings. Trachea projects midline and there is no pneumothorax. Multiple EKG lines overlie the patient and may obscure subtle underlying pathology. Soft tissue planes and included osseous structures are nonsuspicious.  IMPRESSION: Stable cardiomegaly, no acute pulmonary process for this low inspiratory portable examination.   Electronically Signed   By: Awilda Metroourtnay  Bloomer   On: 12/03/2013 04:09    EKG: Independently reviewed.  Assessment/Plan Principal Problem:   AKI (acute kidney injury) Active Problems:   Diabetes mellitus due to underlying condition with hyperglycemia   Obstructive sleep apnea   Diabetic gastroparesis   Adverse reaction to lisinopril   1. AKI - very likely due to lisinopril toxicity, stop lisinopril, monitor BMP, have added lisinopril as an intolerance to his allergies list for the moment.  If renal function does not improve then will require further work up, but strongly suspicious of the lisinopril given the HPI. 2. DM - continue home levemir, SSI, holding metformin due to AKI. 3. OSA - continue O2 via [place], likely needs CPAP, scheduled for sleep study as outpatient.  Continuous pulse ox    Code Status: Full Code  Family Communication: Family at bedside Disposition Plan: Admit to  inpatient   Time spent: 70 min  GARDNER, JARED M. [facility] Pager [phone]  If 7AM-7PM, please contact the day team taking care of the patient [email or web address] Password [email or web address] 12/03/2013, 4:47 AM

## 2013-12-03 NOTE — Progress Notes (Addendum)
Patient seen and examined Multiple admissions this month, came in with headache and dizziness and was found to be in acute on chronic renal failure, hypoxic upon arrival Patient in spite of a creatinine of 2.65  #1 acute kidney injury likely secondary to lisinopril-in the setting of dehydration secondary to gastroparesis Continue IV fluids and replete potassium, repeat BMP in the morning  #2 diabetes insulin-dependent/gastroparesis continue Levemir, SSI, holding metformin  #3 fibromyalgia and chronic pain-patient has obstructive sleep apnea due to morbid obesity, probably not a candidate for long-acting narcotics this may explain his hypoxia upon  admission Patient somnolent, check ABG, assess need for BiPAP?

## 2013-12-04 LAB — COMPREHENSIVE METABOLIC PANEL
ALT: 11 U/L (ref 0–53)
AST: 11 U/L (ref 0–37)
Albumin: 2.5 g/dL — ABNORMAL LOW (ref 3.5–5.2)
Alkaline Phosphatase: 74 U/L (ref 39–117)
BUN: 23 mg/dL (ref 6–23)
CO2: 28 meq/L (ref 19–32)
CREATININE: 2.25 mg/dL — AB (ref 0.50–1.35)
Calcium: 8.6 mg/dL (ref 8.4–10.5)
Chloride: 98 mEq/L (ref 96–112)
GFR, EST AFRICAN AMERICAN: 39 mL/min — AB (ref >90)
GFR, EST NON AFRICAN AMERICAN: 34 mL/min — AB (ref >90)
GLUCOSE: 245 mg/dL — AB (ref 70–99)
Potassium: 3.6 mEq/L — ABNORMAL LOW (ref 3.7–5.3)
Sodium: 139 mEq/L (ref 137–147)
TOTAL PROTEIN: 6 g/dL (ref 6.0–8.3)
Total Bilirubin: 0.2 mg/dL — ABNORMAL LOW (ref 0.3–1.2)

## 2013-12-04 LAB — GLUCOSE, CAPILLARY
GLUCOSE-CAPILLARY: 306 mg/dL — AB (ref 70–99)
GLUCOSE-CAPILLARY: 257 mg/dL — AB (ref 70–99)
Glucose-Capillary: 368 mg/dL — ABNORMAL HIGH (ref 70–99)
Glucose-Capillary: 248 mg/dL — ABNORMAL HIGH (ref 70–99)
Glucose-Capillary: 282 mg/dL — ABNORMAL HIGH (ref 70–99)

## 2013-12-04 MED ORDER — ENOXAPARIN SODIUM 40 MG/0.4ML ~~LOC~~ SOLN
40.0000 mg | SUBCUTANEOUS | Status: DC
  Administered 2013-12-04 – 2013-12-05 (×2): 40 mg via SUBCUTANEOUS
  Filled 2013-12-04 (×3): qty 0.4

## 2013-12-04 MED ORDER — INSULIN DETEMIR 100 UNIT/ML ~~LOC~~ SOLN
65.0000 [IU] | Freq: Every morning | SUBCUTANEOUS | Status: DC
  Administered 2013-12-04 – 2013-12-05 (×2): 65 [IU] via SUBCUTANEOUS
  Filled 2013-12-04 (×2): qty 0.65

## 2013-12-04 MED ORDER — HYDRALAZINE HCL 20 MG/ML IJ SOLN
10.0000 mg | Freq: Four times a day (QID) | INTRAMUSCULAR | Status: DC | PRN
  Administered 2013-12-04 – 2013-12-05 (×2): 10 mg via INTRAVENOUS
  Filled 2013-12-04 (×2): qty 1

## 2013-12-04 MED ORDER — HYDRALAZINE HCL 50 MG PO TABS: 50.0000 mg | ORAL_TABLET | Freq: Three times a day (TID) | ORAL | Status: DC

## 2013-12-04 MED ORDER — SODIUM CHLORIDE 0.9 % IV SOLN: INTRAVENOUS | Status: DC

## 2013-12-04 MED ORDER — AMLODIPINE BESYLATE 10 MG PO TABS
10.0000 mg | ORAL_TABLET | Freq: Every day | ORAL | Status: DC
  Administered 2013-12-04 – 2013-12-05 (×2): 10 mg via ORAL
  Filled 2013-12-04 (×2): qty 1

## 2013-12-04 MED ORDER — HYDRALAZINE HCL 25 MG PO TABS
25.0000 mg | ORAL_TABLET | Freq: Three times a day (TID) | ORAL | Status: DC
  Administered 2013-12-04 – 2013-12-05 (×3): 25 mg via ORAL
  Filled 2013-12-04 (×7): qty 1

## 2013-12-04 NOTE — Progress Notes (Signed)
Nutrition Brief Note  Patient identified on the Malnutrition Screening Tool (MST) Report. Pt is currently tolerating meals, and eating 100%. Pt appears well-nourished and is without significant fat/muscle wasting.  Wt Readings from Last 15 Encounters:  12/03/13 307 lb 15.7 oz (139.699 kg)  11/25/13 317 lb 10.9 oz (144.1 kg)  11/17/13 327 lb 2.6 oz (148.4 kg)  09/04/13 336 lb 3 oz (152.494 kg)  11/07/12 310 lb 8 oz (140.842 kg)  05/18/11 320 lb (145.151 kg)    Body mass index is 42.97 kg/(m^2). Patient meets criteria for Obese Class III based on current BMI.   Current diet order is Carbohydrate Modified, patient is consuming approximately 100% of meals at this time. Labs and medications reviewed.   No nutrition interventions warranted at this time. If nutrition issues arise, please consult RD.   Jarold MottoSamantha Pearlie Nies MS, RD, LDN Inpatient Registered Dietitian Pager: [phone] After-hours pager: [phone]

## 2013-12-04 NOTE — Progress Notes (Signed)
[facility] PROGRESS NOTE  Kenneth Fox UJW:[id number] DOB: Nov 04, 1970 DOA: 12/02/2013 PCP: Pcp Not In System  Assessment/Plan: Principal Problem:   AKI (acute kidney injury) Active Problems:   Diabetes mellitus due to underlying condition with hyperglycemia   Obstructive sleep apnea   Diabetic gastroparesis   Adverse reaction to lisinopril    Multiple admissions this month, came in with headache and dizziness and was found to be in acute on chronic renal failure, hypoxic upon arrival  Patient in spite of a creatinine of 2.65  #1 acute kidney injury likely secondary to lisinopril-in the setting of dehydration secondary to gastroparesis , Continue IV fluids and replete potassium, creatinine 2.25 today down from 2.90,  repeat BMP in the morning  Need to document strict intake and output May DC home in the morning of the creatinine continues to improve #2 diabetes insulin-dependent/gastroparesis uncontrolled, increased Levemir to 65 units today, SSI, holding metformin continue Accu-Cheks #3 fibromyalgia and chronic pain-patient has obstructive sleep apnea due to morbid obesity, probably not a candidate for long-acting narcotics this may explain his hypoxia upon admission  #4 sleep apnea continue CPAP #5 uncontrolled hypertension, patient was previously on EXFORGE HCT 10-320-25 MG Tabs and indapamide 1.25 MG tablet , subsequently switched to lisinopril. Given acute renal failure we'll start the patient on hydralazine and Norvasc   HPI/Subjective: Blood pressure uncontrolled this morning, CBG elevated  Objective: Filed Vitals:   12/03/13 2358 12/04/13 0509 12/04/13 0648 12/04/13 0931  BP:  135/84 160/105 196/140  Pulse: 80 79 83 89  Temp:  98.7 F (37.1 C) 97.8 F (36.6 C) 98 F (36.7 C)  TempSrc:  Oral Oral Oral  Resp: 18 18 18 17   Height:      Weight:      SpO2:  95% 100% 95%    Intake/Output Summary (Last 24 hours) at 12/04/13 0943 Last data filed at 12/04/13  0930  Gross per 24 hour  Intake 3188.33 ml  Output      0 ml  Net 3188.33 ml    Exam:    Cardiovascular: Normal rate, regular rhythm, normal heart sounds and intact distal pulses.  Pulmonary/Chest: Effort normal and breath sounds normal. No respiratory distress.  Abdominal: Soft. Normal appearance and bowel sounds are normal. She exhibits no distension. There is no tenderness.  Musculoskeletal: She exhibits no edema and no tenderness.  Neurological: She is alert. No cranial nerve deficit.    Data Reviewed: Basic Metabolic Panel:  Recent Labs Lab 11/30/13 2359 12/03/13 0059 12/03/13 0243 12/03/13 1210 12/04/13 0345  NA 137 139 137  --  139  K 3.3* 3.2* 3.2*  --  3.6*  CL 96 96 95*  --  98  CO2 24  --  27  --  28  GLUCOSE 254* 206* 195*  --  245*  BUN 6 11 12   --  23  CREATININE 0.95 2.90* 2.65* 2.90* 2.25*  CALCIUM 9.4  --  9.0  --  8.6    Liver Function Tests:  Recent Labs Lab 11/30/13 2359 12/04/13 0345  AST 14 11  ALT 15 11  ALKPHOS 90 74  BILITOT 0.3 0.2*  PROT 7.1 6.0  ALBUMIN 2.9* 2.5*    Recent Labs Lab 11/30/13 2359  LIPASE 44   No results found for this basename: AMMONIA,  in the last 168 hours  CBC:  Recent Labs Lab 11/30/13 2359 12/03/13 0054 12/03/13 0059 12/03/13 1413  WBC 10.2 10.6*  --  10.0  NEUTROABS 8.6*  --   --   --  HGB 14.9 14.5 16.7 13.1  HCT 43.9 44.1 49.0 39.6  MCV 80.1 82.3  --  82.5  PLT 237 225  --  217    Cardiac Enzymes: No results found for this basename: CKTOTAL, CKMB, CKMBINDEX, TROPONINI,  in the last 168 hours BNP (last 3 results)  Recent Labs  11/24/13 2012  PROBNP 72.7     CBG:  Recent Labs Lab 12/03/13 0854 12/03/13 1201 12/03/13 1704 12/03/13 2044 12/04/13 0743  GLUCAP 235* 222* 304* 306* 257*    No results found for this or any previous visit (from the past 240 hour(s)).   Studies: Dg Chest 2 View  11/24/2013   CLINICAL DATA:  43 year old male with pain, vomiting and sickle  cell trait.  EXAM: CHEST  2 VIEW  COMPARISON:  11/21/2013 and prior chest radiographs dating back to 06/29/2010  FINDINGS: Cardiomegaly and mild peribronchial thickening again noted.  There is no evidence of focal airspace disease, pulmonary edema, suspicious pulmonary nodule/mass, pleural effusion, or pneumothorax. No acute bony abnormalities are identified.  IMPRESSION: Cardiomegaly without evidence of active cardiopulmonary disease.   Electronically Signed   By: Laveda Abbe M.D.   On: 11/24/2013 20:29   Dg Chest 2 View  11/16/2013   CLINICAL DATA:  Chest pain.  Vomiting.  Hypertension and diabetes.  EXAM: CHEST  2 VIEW  COMPARISON:  09/06/2013  FINDINGS: The heart size and mediastinal contours are within normal limits. Streaky opacity is seen in the right middle lobe which is new and may represent atelectasis or pneumonia. No evidence pleural effusion.  IMPRESSION: Right middle lobe atelectasis versus pneumonia. Recommend chest radiographic followup in several weeks to confirm resolution.   Electronically Signed   By: Myles Rosenthal M.D.   On: 11/16/2013 19:00   Ct Abdomen Pelvis W Contrast  11/16/2013   CLINICAL DATA:  Abdominal pain  EXAM: CT ABDOMEN AND PELVIS WITH CONTRAST  TECHNIQUE: Multidetector CT imaging of the abdomen and pelvis was performed using the standard protocol following bolus administration of intravenous contrast.  CONTRAST:  OMNIPAQUE IOHEXOL 300 MG/ML  SOLN  COMPARISON:  10/05/2013  FINDINGS: The lung bases again demonstrate bibasilar atelectasis. No focal infiltrate is seen.  The liver is diffusely fatty infiltrated. The spleen, gallbladder, adrenal glands and pancreas are all normal in their CT appearance. The kidneys are well visualized bilaterally and reveal no renal calculi or obstructive changes. Small renal cyst is noted on the right. Small umbilical hernia is noted and stable.  The appendix is well visualized and within normal limits. Bladder is well distended. No pelvic mass  lesion or sidewall abnormality is noted. Small retroperitoneal lymph nodes are seen although not significant by size criteria. The osseous structures show postoperative change in the right hemipelvis. No acute bony abnormality is noted.  IMPRESSION: Stable appearance of the abdomen when compared with the prior study. No acute abnormality is seen.   Electronically Signed   By: Alcide Clever M.D.   On: 11/16/2013 21:36   Dg Chest Port 1 View  12/03/2013   CLINICAL DATA:  Headache and dizziness.  EXAM: PORTABLE CHEST - 1 VIEW  COMPARISON:  Chest radiograph November 24, 2013  FINDINGS: The cardiac silhouette remains similarly enlarged, mediastinal silhouette is nonsuspicious. No pleural effusions or focal consolidations for this low inspiratory portable examination with crowded vasculature markings. Trachea projects midline and there is no pneumothorax. Multiple EKG lines overlie the patient and may obscure subtle underlying pathology. Soft tissue planes and included osseous structures  are nonsuspicious.  IMPRESSION: Stable cardiomegaly, no acute pulmonary process for this low inspiratory portable examination.   Electronically Signed   By: Awilda Metroourtnay  Bloomer   On: 12/03/2013 04:09   Dg Abd 2 Views  11/24/2013   CLINICAL DATA:  ABDOMINAL PAIN  EXAM: ABDOMEN - 2 VIEW  COMPARISON:  Abdominal series dated 11/21/2013  FINDINGS: The bowel gas pattern is normal. There is no evidence of free air. No radio-opaque calculi or other significant radiographic abnormality is seen. Postsurgical changes in the pelvis on the right.  IMPRESSION: Negative.   Electronically Signed   By: Salome HolmesHector  Cooper M.D.   On: 11/24/2013 19:28   Dg Abd 2 Views  11/16/2013   CLINICAL DATA:  Abdominal pain  EXAM: ABDOMEN - 2 VIEW  COMPARISON:  None.  FINDINGS: Scattered large and small bowel gas is noted. Ingested tablet is noted in the right hemi abdomen. Postsurgical changes in the pelvis are seen. No acute bony abnormality is noted. No focal mass  lesion is seen.  IMPRESSION: No acute abnormality noted.   Electronically Signed   By: Alcide CleverMark  Lukens M.D.   On: 11/16/2013 19:12   Dg Abd Acute W/chest  11/21/2013   CLINICAL DATA:  Upper abdominal pain.  EXAM: ACUTE ABDOMEN SERIES (ABDOMEN 2 VIEW & CHEST 1 VIEW)  COMPARISON:  CT of the abdomen pelvis November 16, 2013, chest radiograph November 16, 2013  FINDINGS: Cardiac silhouette appears mildly enlarged, mediastinal silhouette is nonsuspicious. Bibasilar strandy densities. No pleural effusions. No pneumothorax. Large body habitus. Osseous structures are nonsuspicious.  Bowel gas pattern is nondilated and nonobstructive. No intra-abdominal mass effect or pathologic calcifications ; phleboliths in the right pelvis. No intraperitoneal free air. Status post right acetabular OR by CT.  IMPRESSION: Mild cardiomegaly, bibasilar strandy densities favor atelectasis.  Nonspecific bowel gas pattern.   Electronically Signed   By: Awilda Metroourtnay  Bloomer   On: 11/21/2013 22:55    Scheduled Meds: . amLODipine  10 mg Oral Daily  . enoxaparin (LOVENOX) injection  40 mg Subcutaneous Q24H  . hydrALAZINE  25 mg Oral 3 times per day  . insulin aspart  0-15 Units Subcutaneous TID WC  . insulin detemir  65 Units Subcutaneous q morning - 10a  . metoCLOPramide  10 mg Oral TID AC  . pantoprazole  40 mg Oral Daily  . polyethylene glycol  17 g Oral Daily  . pregabalin  75 mg Oral TID   Continuous Infusions: . sodium chloride 75 mL/hr (12/04/13 0905)    Principal Problem:   AKI (acute kidney injury) Active Problems:   Diabetes mellitus due to underlying condition with hyperglycemia   Obstructive sleep apnea   Diabetic gastroparesis   Adverse reaction to lisinopril    Time spent: 40 minutes   [facility]ooth[facility] Regional Medical CenterBROL,NAYANA  [facility] Pager [phone]. If 8PM-8AM, please contact night-coverage at [email or web address], password [email or web address] 12/04/2013, 9:43 AM  LOS: 2 days

## 2013-12-04 NOTE — Evaluation (Signed)
Physical Therapy Evaluation Patient Details Name: Kenneth Fox MRN: [id number] DOB: [birth year] Today's Date: 12/04/2013   History of Present Illness  HPI: Kenneth Fox is a 43 y.o. male with DM2, HTN, OSA, he presents to the ED with diffuse pressure like sensation in his head.  Minor in severity.  Patient went to sleep after taking lyrica today (which he hadnt started in a while), awoke with a panic attack, 1 episode of vomiting and asked wife to call EMS.  Had pulse ox of 86% on arrival which improved to 99% on 2L via [place].  Clinical Impression   Patient evaluated by Physical Therapy with no further acute PT needs identified. All education has been completed and the patient has no further questions.  See below for any follow-up Physical Therapy or equipment needs. PT is signing off. Thank you for this referral.     Follow Up Recommendations Outpatient PT I believe pt will need a prescription for "Outpatient PT: Evaluate and Treat for back pain and deconditioning"    Equipment Recommendations  None recommended by PT    Recommendations for Other Services       Precautions / Restrictions Precautions Precautions: None      Mobility  Bed Mobility                  Transfers Overall transfer level: Modified independent Equipment used: None             General transfer comment: Reported no problems with rise  Ambulation/Gait Ambulation/Gait assistance: Min guard;Supervision Ambulation Distance (Feet): 200 Feet Assistive device: None Gait Pattern/deviations: Wide base of support Gait velocity: decr Gait velocity interpretation: Below normal speed for age/gender General Gait Details: Noted incr lateral translation of center of mass R and L during gait; 2 small losses of balance/stutter steps, from which pt recovered his balance without assist  Stairs            Wheelchair Mobility    Modified Rankin (Stroke Patients Only)       Balance Overall  balance assessment: No apparent balance deficits (not formally assessed)                                           Pertinent Vitals/Pain no apparent distress O2 sats 95% after amb on room air    Home Living Family/patient expects to be discharged to:: Private residence Living Arrangements: Spouse/significant other;Children;Other relatives Available Help at Discharge: Family;Available PRN/intermittently Type of Home: House Home Access: Stairs to enter   [employer] of Steps: 2 (pt doesn't anticipate any problems with steps to enter ) Home Layout: One level Home Equipment: Cane - single point      Prior Function Level of Independence: Independent with assistive device(s)         Comments: cane prn     Hand Dominance        Extremity/Trunk Assessment   Upper Extremity Assessment: Overall WFL for tasks assessed           Lower Extremity Assessment: Overall WFL for tasks assessed (for simple mobility tasks)         Communication   Communication: No difficulties  Cognition Arousal/Alertness: Awake/alert Behavior During Therapy: WFL for tasks assessed/performed Overall Cognitive Status: Within Functional Limits for tasks assessed  General Comments      Exercises        Assessment/Plan    PT Assessment All further PT needs can be met in the next venue of care  PT Diagnosis Other (comment) (Deconditioning)   PT Problem List Decreased range of motion;Decreased activity tolerance;Decreased mobility;Other (comment);Obesity (decr endurance)  PT Treatment Interventions     PT Goals (Current goals can be found in the Care Plan section) Acute Rehab PT Goals Patient Stated Goal: would like to get to the gym PT Goal Formulation: No goals set, d/c therapy    Frequency     Barriers to discharge        Co-evaluation               End of Session   Activity Tolerance: Patient tolerated treatment  well Patient left: in chair;with call bell/phone within reach Nurse Communication: Mobility status         Time: 2706-2376 PT Time Calculation (min): 20 min   Charges:   PT Evaluation $Initial PT Evaluation Tier I: 1 Procedure PT Treatments $Gait Training: 8-22 mins   PT G Codes:          Quin Hoop 12/04/2013, 1:30 PM  Roney Marion, [place] Pager [phone] Office [phone]

## 2013-12-05 LAB — COMPREHENSIVE METABOLIC PANEL
ALT: 11 U/L (ref 0–53)
AST: 16 U/L (ref 0–37)
Albumin: 2.6 g/dL — ABNORMAL LOW (ref 3.5–5.2)
Alkaline Phosphatase: 75 U/L (ref 39–117)
BUN: 13 mg/dL (ref 6–23)
CALCIUM: 9 mg/dL (ref 8.4–10.5)
CO2: 27 mEq/L (ref 19–32)
CREATININE: 1.05 mg/dL (ref 0.50–1.35)
Chloride: 106 mEq/L (ref 96–112)
GFR calc Af Amer: >90 mL/min (ref >90)
GFR calc non Af Amer: 85 mL/min — ABNORMAL LOW (ref >90)
GLUCOSE: 203 mg/dL — AB (ref 70–99)
Potassium: 4.1 mEq/L (ref 3.7–5.3)
SODIUM: 145 meq/L (ref 137–147)
Total Bilirubin: 0.2 mg/dL — ABNORMAL LOW (ref 0.3–1.2)
Total Protein: 6.4 g/dL (ref 6.0–8.3)

## 2013-12-05 LAB — GLUCOSE, CAPILLARY
Glucose-Capillary: 214 mg/dL — ABNORMAL HIGH (ref 70–99)
Glucose-Capillary: 229 mg/dL — ABNORMAL HIGH (ref 70–99)

## 2013-12-05 MED ORDER — AMLODIPINE BESYLATE 10 MG PO TABS: 10.0000 mg | ORAL_TABLET | Freq: Every day | ORAL | Status: DC

## 2013-12-05 MED ORDER — HYDRALAZINE HCL 25 MG PO TABS: 50.0000 mg | ORAL_TABLET | Freq: Three times a day (TID) | ORAL | Status: DC

## 2013-12-05 MED ORDER — INSULIN DETEMIR 100 UNIT/ML ~~LOC~~ SOLN: 65.0000 [IU] | Freq: Every morning | SUBCUTANEOUS | Status: DC

## 2013-12-05 NOTE — Progress Notes (Signed)
Inpatient Diabetes Program Recommendations  AACE/ADA: New Consensus Statement on Inpatient Glycemic Control (2013)  Target Ranges:  Prepandial:   less than 140 mg/dL      Peak postprandial:   less than 180 mg/dL (1-2 hours)      Critically ill patients:  140 - 180 mg/dL     Results for Kenneth Fox, Kenneth Fox (MRN [id number]) as of 12/05/2013 08:54  Ref. Range 12/04/2013 07:43 12/04/2013 11:36 12/04/2013 16:28 12/04/2013 21:03  Glucose-Capillary Latest Range: 70-99 mg/dL 119257 (H) 147368 (H) 829248 (H) 282 (H)    Results for Kenneth Fox, Kenneth Fox (MRN [id number]) as of 12/05/2013 08:54  Ref. Range 12/05/2013 07:58  Glucose-Capillary Latest Range: 70-99 mg/dL 784214 (H)     Note Levemir increased to 65 units QAM yesterday.  Fasting glucose still elevated today.  Patient also having issues with postprandial glucose elevations.    MD- Please consider the following in-hospital insulin adjustments:  1. Increase Levemir to 68 units QAM 2. Add Novolog Meal Coverage- Novolog 6 units tid with meals (patient eating 100% of meals)    Will follow Ambrose FinlandJeannine Johnston Fishel RN, MSN, CDE Diabetes Coordinator Inpatient Diabetes Program Team Pager: [phone] (8a-10p)

## 2013-12-05 NOTE — Discharge Summary (Signed)
Physician Discharge Summary  Kenneth Fox MRN: [id number] DOB/AGE: 45/07/1970 45 y.o.  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   Obstructive sleep apnea   Diabetic gastroparesis   Adverse reaction to lisinopril  Follow up recommendations #1 follow up with PCP in 1 week #2 follow up BMP in one week #3 outpatient sleep study and 2-D echo recommended because of morbid obesity and notable desaturations on nocturnal pulse oximetry      Medication List    STOP taking these medications       lisinopril 20 MG tablet  Commonly known as:  PRINIVIL,ZESTRIL     metFORMIN 1000 MG tablet  Commonly known as:  GLUCOPHAGE      TAKE these medications       amLODipine 10 MG tablet  Commonly known as:  NORVASC  Take 1 tablet (10 mg total) by mouth daily.     hydrALAZINE 25 MG tablet  Commonly known as:  APRESOLINE  Take 2 tablets (50 mg total) by mouth 3 (three) times daily.     insulin aspart 100 UNIT/ML injection  Commonly known as:  novoLOG  - Inject 10-20 Units into the skin 3 (three) times daily with meals. per sliding scale  - CBG 70 - 120: 0 units  - CBG 121 - 150: 2 units  - CBG 151 - 200: 3 units  - CBG 201 - 250: 5 units  - CBG 251 - 300: 8 units  - CBG 301 - 350: 11 units  - CBG 351 - 400: 15 units     insulin detemir 100 UNIT/ML injection  Commonly known as:  LEVEMIR  Inject 0.65 mLs (65 Units total) into the skin every morning.     metoCLOPramide 10 MG tablet  Commonly known as:  REGLAN  Take 1 tablet (10 mg total) by mouth 3 (three) times daily before meals.     omeprazole 20 MG capsule  Commonly known as:  PRILOSEC  Take 1 capsule (20 mg total) by mouth 2 (two) times daily.     oxyCODONE 5 MG immediate release tablet  Commonly known as:  Oxy IR/ROXICODONE  Take 1 tablet (5  mg total) by mouth every 4 (four) hours as needed for moderate pain.     polyethylene glycol packet  Commonly known as:  MIRALAX  Take 17 g by mouth daily.     Potassium Chloride ER 20 MEQ Tbcr  Take 20 mEq by mouth daily.     pregabalin 75 MG capsule  Commonly known as:  LYRICA  Take 75 mg by mouth 3 (three) times daily.     promethazine 25 MG tablet  Commonly known as:  PHENERGAN  Take 25 mg by mouth every 8 (eight) hours as needed for nausea or vomiting.        Discharge Condition: Stable  Disposition: 01-Home or Self Care   Consults: None   Significant Diagnostic Studies: Dg Chest 2 View  11/24/2013   CLINICAL DATA:  43 year old male with pain, vomiting and sickle cell trait.  EXAM: CHEST  2 VIEW  COMPARISON:  11/21/2013 and prior chest radiographs dating back to 06/29/2010  FINDINGS: Cardiomegaly and mild peribronchial thickening again noted.  There is no evidence of focal airspace disease, pulmonary edema, suspicious pulmonary nodule/mass, pleural effusion, or pneumothorax. No acute bony abnormalities are  identified.  IMPRESSION: Cardiomegaly without evidence of active cardiopulmonary disease.   Electronically Signed   By: Hassan Rowan M.D.   On: 11/24/2013 20:29   Dg Chest 2 View  11/16/2013   CLINICAL DATA:  Chest pain.  Vomiting.  Hypertension and diabetes.  EXAM: CHEST  2 VIEW  COMPARISON:  09/06/2013  FINDINGS: The heart size and mediastinal contours are within normal limits. Streaky opacity is seen in the right middle lobe which is new and may represent atelectasis or pneumonia. No evidence pleural effusion.  IMPRESSION: Right middle lobe atelectasis versus pneumonia. Recommend chest radiographic followup in several weeks to confirm resolution.   Electronically Signed   By: Earle Gell M.D.   On: 11/16/2013 19:00   Ct Abdomen Pelvis W Contrast  11/16/2013   CLINICAL DATA:  Abdominal pain  EXAM: CT ABDOMEN AND PELVIS WITH CONTRAST  TECHNIQUE: Multidetector CT imaging of the  abdomen and pelvis was performed using the standard protocol following bolus administration of intravenous contrast.  CONTRAST:  131m OMNIPAQUE IOHEXOL 300 MG/ML  SOLN  COMPARISON:  10/05/2013  FINDINGS: The lung bases again demonstrate bibasilar atelectasis. No focal infiltrate is seen.  The liver is diffusely fatty infiltrated. The spleen, gallbladder, adrenal glands and pancreas are all normal in their CT appearance. The kidneys are well visualized bilaterally and reveal no renal calculi or obstructive changes. Small renal cyst is noted on the right. Small umbilical hernia is noted and stable.  The appendix is well visualized and within normal limits. Bladder is well distended. No pelvic mass lesion or sidewall abnormality is noted. Small retroperitoneal lymph nodes are seen although not significant by size criteria. The osseous structures show postoperative change in the right hemipelvis. No acute bony abnormality is noted.  IMPRESSION: Stable appearance of the abdomen when compared with the prior study. No acute abnormality is seen.   Electronically Signed   By: MInez CatalinaM.D.   On: 11/16/2013 21:36   Dg Chest Port 1 View  12/03/2013   CLINICAL DATA:  Headache and dizziness.  EXAM: PORTABLE CHEST - 1 VIEW  COMPARISON:  Chest radiograph November 24, 2013  FINDINGS: The cardiac silhouette remains similarly enlarged, mediastinal silhouette is nonsuspicious. No pleural effusions or focal consolidations for this low inspiratory portable examination with crowded vasculature markings. Trachea projects midline and there is no pneumothorax. Multiple EKG lines overlie the patient and may obscure subtle underlying pathology. Soft tissue planes and included osseous structures are nonsuspicious.  IMPRESSION: Stable cardiomegaly, no acute pulmonary process for this low inspiratory portable examination.   Electronically Signed   By: CElon Alas  On: 12/03/2013 04:09   Dg Abd 2 Views  11/24/2013   CLINICAL DATA:   ABDOMINAL PAIN  EXAM: ABDOMEN - 2 VIEW  COMPARISON:  Abdominal series dated 11/21/2013  FINDINGS: The bowel gas pattern is normal. There is no evidence of free air. No radio-opaque calculi or other significant radiographic abnormality is seen. Postsurgical changes in the pelvis on the right.  IMPRESSION: Negative.   Electronically Signed   By: HMargaree MackintoshM.D.   On: 11/24/2013 19:28   Dg Abd 2 Views  11/16/2013   CLINICAL DATA:  Abdominal pain  EXAM: ABDOMEN - 2 VIEW  COMPARISON:  None.  FINDINGS: Scattered large and small bowel gas is noted. Ingested tablet is noted in the right hemi abdomen. Postsurgical changes in the pelvis are seen. No acute bony abnormality is noted. No focal mass lesion is seen.  IMPRESSION: No  acute abnormality noted.   Electronically Signed   By: Inez Catalina M.D.   On: 11/16/2013 19:12   Dg Abd Acute W/chest  11/21/2013   CLINICAL DATA:  Upper abdominal pain.  EXAM: ACUTE ABDOMEN SERIES (ABDOMEN 2 VIEW & CHEST 1 VIEW)  COMPARISON:  CT of the abdomen pelvis November 16, 2013, chest radiograph November 16, 2013  FINDINGS: Cardiac silhouette appears mildly enlarged, mediastinal silhouette is nonsuspicious. Bibasilar strandy densities. No pleural effusions. No pneumothorax. Large body habitus. Osseous structures are nonsuspicious.  Bowel gas pattern is nondilated and nonobstructive. No intra-abdominal mass effect or pathologic calcifications ; phleboliths in the right pelvis. No intraperitoneal free air. Status post right acetabular OR by CT.  IMPRESSION: Mild cardiomegaly, bibasilar strandy densities favor atelectasis.  Nonspecific bowel gas pattern.   Electronically Signed   By: Elon Alas   On: 11/21/2013 22:55      Microbiology: No results found for this or any previous visit (from the past 240 hour(s)).   Labs: Results for orders placed during the hospital encounter of 12/02/13 (from the past 48 hour(s))  GLUCOSE, CAPILLARY     Status: Abnormal   Collection Time     12/03/13 12:01 PM      Result Value Ref Range   Glucose-Capillary 222 (*) 70 - 99 mg/dL  CREATININE, SERUM     Status: Abnormal   Collection Time    12/03/13 12:10 PM      Result Value Ref Range   Creatinine, Ser 2.90 (*) 0.50 - 1.35 mg/dL   GFR calc non Af Amer 25 (*) >90 mL/min   GFR calc Af Amer 29 (*) >90 mL/min   Comment: (NOTE)     The eGFR has been calculated using the CKD EPI equation.     This calculation has not been validated in all clinical situations.     eGFR's persistently <90 mL/min signify possible Chronic Kidney     Disease.  CBC     Status: None   Collection Time    12/03/13  2:13 PM      Result Value Ref Range   WBC 10.0  4.0 - 10.5 K/uL   RBC 4.80  4.22 - 5.81 MIL/uL   Hemoglobin 13.1  13.0 - 17.0 g/dL   Comment: DELTA CHECK NOTED   HCT 39.6  39.0 - 52.0 %   MCV 82.5  78.0 - 100.0 fL   MCH 27.3  26.0 - 34.0 pg   MCHC 33.1  30.0 - 36.0 g/dL   RDW 15.0  11.5 - 15.5 %   Platelets 217  150 - 400 K/uL  GLUCOSE, CAPILLARY     Status: Abnormal   Collection Time    12/03/13  5:04 PM      Result Value Ref Range   Glucose-Capillary 304 (*) 70 - 99 mg/dL  GLUCOSE, CAPILLARY     Status: Abnormal   Collection Time    12/03/13  8:44 PM      Result Value Ref Range   Glucose-Capillary 306 (*) 70 - 99 mg/dL  COMPREHENSIVE METABOLIC PANEL     Status: Abnormal   Collection Time    12/04/13  3:45 AM      Result Value Ref Range   Sodium 139  137 - 147 mEq/L   Potassium 3.6 (*) 3.7 - 5.3 mEq/L   Chloride 98  96 - 112 mEq/L   CO2 28  19 - 32 mEq/L   Glucose, Bld 245 (*) 70 - 99  mg/dL   BUN 23  6 - 23 mg/dL   Comment: DELTA CHECK NOTED   Creatinine, Ser 2.25 (*) 0.50 - 1.35 mg/dL   Calcium 8.6  8.4 - 10.5 mg/dL   Total Protein 6.0  6.0 - 8.3 g/dL   Albumin 2.5 (*) 3.5 - 5.2 g/dL   AST 11  0 - 37 U/L   ALT 11  0 - 53 U/L   Alkaline Phosphatase 74  39 - 117 U/L   Total Bilirubin 0.2 (*) 0.3 - 1.2 mg/dL   GFR calc non Af Amer 34 (*) >90 mL/min   GFR calc Af Amer  39 (*) >90 mL/min   Comment: (NOTE)     The eGFR has been calculated using the CKD EPI equation.     This calculation has not been validated in all clinical situations.     eGFR's persistently <90 mL/min signify possible Chronic Kidney     Disease.  GLUCOSE, CAPILLARY     Status: Abnormal   Collection Time    12/04/13  7:43 AM      Result Value Ref Range   Glucose-Capillary 257 (*) 70 - 99 mg/dL  GLUCOSE, CAPILLARY     Status: Abnormal   Collection Time    12/04/13 11:36 AM      Result Value Ref Range   Glucose-Capillary 368 (*) 70 - 99 mg/dL   Comment 1 Notify RN    GLUCOSE, CAPILLARY     Status: Abnormal   Collection Time    12/04/13  4:28 PM      Result Value Ref Range   Glucose-Capillary 248 (*) 70 - 99 mg/dL  GLUCOSE, CAPILLARY     Status: Abnormal   Collection Time    12/04/13  9:03 PM      Result Value Ref Range   Glucose-Capillary 282 (*) 70 - 99 mg/dL  COMPREHENSIVE METABOLIC PANEL     Status: Abnormal   Collection Time    12/05/13  5:30 AM      Result Value Ref Range   Sodium 145  137 - 147 mEq/L   Potassium 4.1  3.7 - 5.3 mEq/L   Comment: HEMOLYSIS AT THIS LEVEL MAY AFFECT RESULT   Chloride 106  96 - 112 mEq/L   CO2 27  19 - 32 mEq/L   Glucose, Bld 203 (*) 70 - 99 mg/dL   BUN 13  6 - 23 mg/dL   Creatinine, Ser 1.05  0.50 - 1.35 mg/dL   Calcium 9.0  8.4 - 10.5 mg/dL   Total Protein 6.4  6.0 - 8.3 g/dL   Albumin 2.6 (*) 3.5 - 5.2 g/dL   AST 16  0 - 37 U/L   Comment: HEMOLYSIS AT THIS LEVEL MAY AFFECT RESULT   ALT 11  0 - 53 U/L   Comment: HEMOLYSIS AT THIS LEVEL MAY AFFECT RESULT   Alkaline Phosphatase 75  39 - 117 U/L   Total Bilirubin 0.2 (*) 0.3 - 1.2 mg/dL   GFR calc non Af Amer 85 (*) >90 mL/min   GFR calc Af Amer >90  >90 mL/min   Comment: (NOTE)     The eGFR has been calculated using the CKD EPI equation.     This calculation has not been validated in all clinical situations.     eGFR's persistently <90 mL/min signify possible Chronic Kidney      Disease.  GLUCOSE, CAPILLARY     Status: Abnormal   Collection Time  12/05/13  7:58 AM      Result Value Ref Range   Glucose-Capillary 214 (*) 70 - 99 mg/dL     HPI : 42y.o. male with DM2, HTN, OSA, he presents to the ED with diffuse pressure like sensation in his head.Minor in severity. Patient went to sleep after taking lyrica today (which he hadnt started in a while), awoke with a panic attack, 1 episode of vomiting and asked wife to call EMS. Had pulse ox of 86% on arrival which improved to 99% on 2L via [place].  Work up in the ED was remarkable for new onset AKI, creatinine 2.65, this is a marked increase from 0.95 just a couple of days ago on 6/25. Upon further review, it turns out the patient has just started taking lisinopril to treat HTN over the past 1-2 days    HOSPITAL COURSE:  #1 acute kidney failure  likely secondary to lisinopril-in the setting of dehydration secondary to gastroparesis ,  Treated with  IV fluids and repleted potassium, creatinine peaked at 2.90, normal prior to DC  repeat BMP in at PCP follow up  Dc lisinopril  #2 diabetes insulin-dependent/gastroparesis-  Uncontrolled CBG during this hospitalisation, increased Levemir to 65 units today, SSI, holding metformin due to ARF ,may restart if Cr stable outpatient    #3 fibromyalgia and chronic pain-patient has obstructive sleep apnea due to morbid obesity has a pending sleep study next month, probably not a candidate for long-acting narcotics which are attributed to his nocturnal desaturations, this may explain his hypoxia upon admission . Recommended patient to avoid long-acting narcotics and avoid taking narcotics at bedtime.   #4 suspected sleep apnea . Nocturnal pulse oximetry the patient was found to have desaturations up to 77% on room air. He is being prescribed oxygen for home 2 L at bedtime. Sleep study next month  #5 uncontrolled hypertension, patient was previously on EXFORGE HCT 10-320-25 MG Tabs and  indapamide 1.25 MG tablet , subsequently switched to lisinopril. Lisinopril was discontinued during this admission. Given acute renal failure , switched to hydralazine and Norvasc. The patient will benefit from outpatient 2-D echo to rule out pulmonary hypertension, CHF. His medications can be adjusted based on the results.    Discharge Exam:   Blood pressure 159/87, pulse 93, temperature 98 F (36.7 C), temperature source Oral, resp. rate 20, height 5' 11"  (1.803 m), weight 139.699 kg (307 lb 15.7 oz), SpO2 94.00%.  General Appearance:  Alert, oriented, no distress, appears stated age   Head:  Normocephalic, atraumatic   Eyes:  PERRL, EOMI, sclera non-icteric      Nose:  Nares without drainage or epistaxis. Mucosa, turbinates normal   Throat:  Moist mucous membranes. Oropharynx without erythema or exudate.   Neck:  Supple. No carotid bruits. No thyromegaly. No lymphadenopathy.   Back:  No CVA tenderness, no spinal tenderness   Lungs:  Clear to auscultation bilaterally, without wheezes, rhonchi or rales   Chest wall:  No tenderness to palpitation   Heart:  Regular rate and rhythm without murmurs, gallops, rubs   Abdomen:  Soft, non-tender, nondistended, normal bowel sounds, no organomegaly   Genitalia:  deferred   Rectal:  deferred   Extremities:  No clubbing, cyanosis or edema.   Pulses:  2+ and symmetric all extremities   Skin:  Skin color, texture, turgor normal, no rashes or lesions   Lymph nodes:  Cervical, supraclavicular, and axillary nodes normal   Neurologic:  CNII-XII intact. Normal strength, sensation and  reflexes  throughout            Discharge Instructions   Call MD for:  difficulty breathing, headache or visual disturbances    Complete by:  As directed      Call MD for:  temperature >100.4    Complete by:  As directed      Diet - low sodium heart healthy    Complete by:  As directed      Increase activity slowly    Complete by:  As directed             Follow-up Information   Follow up with Gelene Mink, MD. Schedule an appointment as soon as possible for a visit in 1 week.   Specialty:  Emergency Medicine   Contact information:   [address] [place] [address] [phone]       Signed: Reyne Dumas 12/05/2013, 10:26 AM

## 2014-01-01 ENCOUNTER — Ambulatory Visit (HOSPITAL_BASED_OUTPATIENT_CLINIC_OR_DEPARTMENT_OTHER): Payer: Medicaid Other | Attending: Internal Medicine | Admitting: Radiology

## 2014-01-01 VITALS — Ht 71.0 in | Wt 331.0 lb

## 2014-01-01 DIAGNOSIS — R0989 Other specified symptoms and signs involving the circulatory and respiratory systems: Secondary | ICD-10-CM | POA: Insufficient documentation

## 2014-01-01 DIAGNOSIS — R0609 Other forms of dyspnea: Secondary | ICD-10-CM | POA: Diagnosis not present

## 2014-01-01 DIAGNOSIS — G4710 Hypersomnia, unspecified: Secondary | ICD-10-CM | POA: Diagnosis present

## 2014-01-01 DIAGNOSIS — G4733 Obstructive sleep apnea (adult) (pediatric): Secondary | ICD-10-CM | POA: Insufficient documentation

## 2014-01-05 DIAGNOSIS — G4733 Obstructive sleep apnea (adult) (pediatric): Secondary | ICD-10-CM

## 2014-01-05 NOTE — Sleep Study (Signed)
   NAME: Kenneth Fox E Harada DATE OF BIRTH:  [birth year] MEDICAL RECORD NUMBER [id number]  LOCATION: [facility]  PHYSICIAN: Alejos Reinhardt D  DATE OF STUDY: 01/01/2014  SLEEP STUDY TYPE: Nocturnal Polysomnogram               REFERRING PHYSICIAN: Jerald Kiefhiu, Stephen K, MD  INDICATION FOR STUDY: Hypersomnia with sleep apnea  EPWORTH SLEEPINESS SCORE:   22/24 HEIGHT: 5\' 11"  (180.3 cm)  WEIGHT: 331 lb (150.141 kg)    Body mass index is 46.19 kg/(m^2).  NECK SIZE: 19 in.  MEDICATIONS: Charted for review  SLEEP ARCHITECTURE: Split study protocol. During the diagnostic phase, total sleep time 120.5 minutes with sleep efficiency 62%. Stage I was 25.7%, stage II 74.3%, stage III and REM were absent. Sleep latency 17.5 minutes, awake after sleep onset 51.5 minutes, arousal index 41.3, bedtime medications: Norvasc, hydralazine, Reglan, oxycodone, Lyrica, losartan/HCTZ, Lantus  RESPIRATORY DATA: Apnea hypopneas index (AHI) 111.5 per hour. 224 total events scored including 77 obstructive apneas and 147 hypopneas. Events were not positional. CPAP titration to 19 CWP, AHI 1.3 per hour. He wore a large fullface mask.  OXYGEN DATA: Very loud snoring before CPAP with oxygen desaturation to a nadir of 56% on room air. With CPAP control, snoring was prevented and mean oxygen saturation of 89.3% on room air. At 3:26 AM, supplemental oxygen was added at 1 L per minute to maintain saturation 90% and above.  CARDIAC DATA: Normal sinus rhythm  MOVEMENT/PARASOMNIA: No significant motor disturbance, bathroom x1  IMPRESSION/ RECOMMENDATION:   1) Severe obstructive sleep apnea/hypopneas syndrome, AHI 111.5 per hour with non-positional events.. Loud snoring with oxygen desaturation to a nadir of 56% on room air. 2) CPAP titration to 19 CWP, AHI 1.3 per hour. He wore a large [employer] Simplus  fullface mask with heated humidifier. Snoring was prevented. 3) Room air saturation on arrival while  awake was only 89%, indicating underlying cardiopulmonary disease. Supplemental oxygen at 1 L per minute was added to his CPAP at 3:26 AM to maintain saturation 90% and above.   Signed Jetty Duhamellinton Aaron Boeh M.D. Waymon BudgeYOUNG,Nikhil Osei D Diplomate, American Board of Sleep Medicine  ELECTRONICALLY SIGNED ON:  01/05/2014, 3:05 PM [facility] PH: [phone]   FX: [phone] ACCREDITED BY THE AMERICAN ACADEMY OF SLEEP MEDICINE

## 2014-02-26 ENCOUNTER — Emergency Department (HOSPITAL_COMMUNITY)
Admission: EM | Admit: 2014-02-26 | Discharge: 2014-02-26 | Disposition: A | Discharge status: Home / Self Care | Payer: Medicaid Other | Attending: Emergency Medicine | Admitting: Emergency Medicine

## 2014-02-26 ENCOUNTER — Encounter (HOSPITAL_COMMUNITY): Payer: Self-pay | Admitting: Emergency Medicine

## 2014-02-26 DIAGNOSIS — E119 Type 2 diabetes mellitus without complications: Secondary | ICD-10-CM | POA: Insufficient documentation

## 2014-02-26 DIAGNOSIS — H5710 Ocular pain, unspecified eye: Secondary | ICD-10-CM | POA: Insufficient documentation

## 2014-02-26 DIAGNOSIS — H209 Unspecified iridocyclitis: Secondary | ICD-10-CM | POA: Diagnosis not present

## 2014-02-26 DIAGNOSIS — X58XXXA Exposure to other specified factors, initial encounter: Secondary | ICD-10-CM | POA: Insufficient documentation

## 2014-02-26 DIAGNOSIS — Z8659 Personal history of other mental and behavioral disorders: Secondary | ICD-10-CM | POA: Diagnosis not present

## 2014-02-26 DIAGNOSIS — I10 Essential (primary) hypertension: Secondary | ICD-10-CM | POA: Diagnosis not present

## 2014-02-26 DIAGNOSIS — S058X9A Other injuries of unspecified eye and orbit, initial encounter: Secondary | ICD-10-CM | POA: Diagnosis not present

## 2014-02-26 DIAGNOSIS — K5900 Constipation, unspecified: Secondary | ICD-10-CM | POA: Diagnosis not present

## 2014-02-26 DIAGNOSIS — S0501XA Injury of conjunctiva and corneal abrasion without foreign body, right eye, initial encounter: Secondary | ICD-10-CM

## 2014-02-26 DIAGNOSIS — Z794 Long term (current) use of insulin: Secondary | ICD-10-CM | POA: Diagnosis not present

## 2014-02-26 DIAGNOSIS — Y929 Unspecified place or not applicable: Secondary | ICD-10-CM | POA: Diagnosis not present

## 2014-02-26 DIAGNOSIS — Y939 Activity, unspecified: Secondary | ICD-10-CM | POA: Diagnosis not present

## 2014-02-26 DIAGNOSIS — G589 Mononeuropathy, unspecified: Secondary | ICD-10-CM | POA: Insufficient documentation

## 2014-02-26 DIAGNOSIS — Z79899 Other long term (current) drug therapy: Secondary | ICD-10-CM | POA: Insufficient documentation

## 2014-02-26 DIAGNOSIS — Z862 Personal history of diseases of the blood and blood-forming organs and certain disorders involving the immune mechanism: Secondary | ICD-10-CM | POA: Diagnosis not present

## 2014-02-26 DIAGNOSIS — IMO0001 Reserved for inherently not codable concepts without codable children: Secondary | ICD-10-CM | POA: Insufficient documentation

## 2014-02-26 DIAGNOSIS — E669 Obesity, unspecified: Secondary | ICD-10-CM | POA: Diagnosis not present

## 2014-02-26 DIAGNOSIS — J45909 Unspecified asthma, uncomplicated: Secondary | ICD-10-CM | POA: Diagnosis not present

## 2014-02-26 DIAGNOSIS — K219 Gastro-esophageal reflux disease without esophagitis: Secondary | ICD-10-CM | POA: Insufficient documentation

## 2014-02-26 LAB — CBG MONITORING, ED: GLUCOSE-CAPILLARY: 230 mg/dL — AB (ref 70–99)

## 2014-02-26 MED ORDER — POLYMYXIN B-TRIMETHOPRIM 10000-0.1 UNIT/ML-% OP SOLN: 1.0000 [drp] | OPHTHALMIC | Status: DC

## 2014-02-26 MED ORDER — CYCLOPENTOLATE HCL 1 % OP SOLN: 1.0000 [drp] | Freq: Every day | OPHTHALMIC | Status: DC

## 2014-02-26 MED ORDER — CYCLOPENTOLATE HCL 1 % OP SOLN
1.0000 [drp] | Freq: Once | OPHTHALMIC | Status: AC
  Administered 2014-02-26: 1 [drp] via OPHTHALMIC
  Filled 2014-02-26: qty 2

## 2014-02-26 MED ORDER — POLYMYXIN B-TRIMETHOPRIM 10000-0.1 UNIT/ML-% OP SOLN
2.0000 [drp] | Freq: Once | OPHTHALMIC | Status: DC
  Filled 2014-02-26: qty 10

## 2014-02-26 MED ORDER — FLUORESCEIN SODIUM 1 MG OP STRP
1.0000 | ORAL_STRIP | Freq: Once | OPHTHALMIC | Status: DC
  Filled 2014-02-26: qty 1

## 2014-02-26 MED ORDER — PROPARACAINE HCL 0.5 % OP SOLN
1.0000 [drp] | Freq: Once | OPHTHALMIC | Status: DC
  Filled 2014-02-26: qty 15

## 2014-02-26 NOTE — Discharge Instructions (Signed)
°  Dr. Burgess Estelle will see you in his office tomorrow.  Do not reuse your contact lenses and do not use any contact lenses until you are cleared by the eye doctor.  You may only apply the Cyclogyl once a day.   Wash your hands frequently and try to keep your hands away from the affected eye(s).   You should be feeling some improvement by 48 hours. If symptoms worsen, you develop pain, change in your vision or no improvement in 48 hours please follow with the ophthalmologist or, if that is not possible, return to the emergency room for a recheck.

## 2014-02-26 NOTE — ED Provider Notes (Signed)
CSN: [id number]     Arrival date & time 02/26/14  1617 History  This chart was scribed for non-physician practitioner, Wynetta Emery, PA-C working with Mirian Mo, MD by Greggory Stallion, ED scribe. This patient was seen in room TR04C/TR04C and the patient's care was started at 5:01 PM.   Chief Complaint  Patient presents with  . Eye Pain   The history is provided by the patient. No language interpreter was used.   HPI Comments: Kenneth Fox is a 43 y.o. male who presents to the Emergency Department complaining of right eye pain, redness, itching and watery drainage that started 2 days ago when he woke up. Also reports photophobia. States it feels like something is in his eye. Pt does not wear contacts or glasses. Denies visual changes. No one at home has similar symptoms.   Past Medical History  Diagnosis Date  . Hypertension   . Neuropathy   . Anxiety   . Depression   . Sickle cell trait   . Gastroenteritis   . Obesity   . Constipation   . High cholesterol   . Asthma   . Chronic bronchitis     "get it q yr" (11/25/2013)  . Sleep apnea     "don't use CPAP" (11/25/2013)  . Type II diabetes mellitus   . GERD (gastroesophageal reflux disease)   . Stomach ulcer   . Fibromyalgia    Past Surgical History  Procedure Laterality Date  . Hip fracture surgery Right 1999    "put a plate in"   Family History  Problem Relation Age of Onset  . Diabetes Father   . Neuropathy Father   . Hypertension Father   . Sickle cell anemia Mother    History  Substance Use Topics  . Smoking status: Never Smoker   . Smokeless tobacco: Never Used  . Alcohol Use: No    Review of Systems  Eyes: Positive for photophobia, discharge, redness and itching. Negative for visual disturbance.  All other systems reviewed and are negative.  Allergies  Lisinopril  Home Medications   Prior to Admission medications   Medication Sig Start Date End Date Taking? Authorizing Provider  amLODipine  (NORVASC) 10 MG tablet Take 1 tablet (10 mg total) by mouth daily. 12/05/13   Richarda Overlie, MD  hydrALAZINE (APRESOLINE) 25 MG tablet Take 2 tablets (50 mg total) by mouth 3 (three) times daily. 12/05/13   Richarda Overlie, MD  insulin aspart (NOVOLOG) 100 UNIT/ML injection Inject 10-20 Units into the skin 3 (three) times daily with meals. per sliding scale CBG 70 - 120: 0 units CBG 121 - 150: 2 units CBG 151 - 200: 3 units CBG 201 - 250: 5 units CBG 251 - 300: 8 units CBG 301 - 350: 11 units CBG 351 - 400: 15 units 11/26/13   Shanker Levora Dredge, MD  insulin detemir (LEVEMIR) 100 UNIT/ML injection Inject 0.65 mLs (65 Units total) into the skin every morning. 12/05/13   Richarda Overlie, MD  metoCLOPramide (REGLAN) 10 MG tablet Take 1 tablet (10 mg total) by mouth 3 (three) times daily before meals. 12/01/13   Olivia Mackie, MD  omeprazole (PRILOSEC) 20 MG capsule Take 1 capsule (20 mg total) by mouth 2 (two) times daily. 12/01/13   Olivia Mackie, MD  oxyCODONE (OXY IR/ROXICODONE) 5 MG immediate release tablet Take 1 tablet (5 mg total) by mouth every 4 (four) hours as needed for moderate pain. 11/26/13   Shanker Levora Dredge, MD  polyethylene glycol (MIRALAX) packet Take 17 g by mouth daily. 11/18/13   Jerald Kief, MD  potassium chloride 20 MEQ TBCR Take 20 mEq by mouth daily. 11/26/13   Shanker Levora Dredge, MD  pregabalin (LYRICA) 75 MG capsule Take 75 mg by mouth 3 (three) times daily.    Historical Provider, MD  promethazine (PHENERGAN) 25 MG tablet Take 25 mg by mouth every 8 (eight) hours as needed for nausea or vomiting.     Historical Provider, MD   BP 170/96  Pulse 85  Temp(Src) 98.3 F (36.8 C) (Oral)  Resp 16  Ht  (1.803 m)  Wt 330 lb (149.687 kg)  BMI 46.05 kg/m2  SpO2 95%  Physical Exam  Nursing note and vitals reviewed. Constitutional: He is oriented to person, place, and time. He appears well-developed and well-nourished. No distress.  HENT:  Head: Normocephalic.  Eyes: EOM and lids  are normal. Lids are everted and swept, no foreign bodies found. No foreign body present in the right eye. No foreign body present in the left eye. Right conjunctiva is injected.  Slit lamp exam:      The right eye shows corneal abrasion. The right eye shows no corneal flare, no corneal ulcer and no foreign body.    Visual Acuity - Bilateral Distance: 20/30 ; R Distance: 20/30 ; L Distance: 20/50  Right eye pressure: 19 Left eye pressure: 21  Cardiovascular: Normal rate.   Pulmonary/Chest: Effort normal and breath sounds normal. No stridor.  Abdominal: Soft. Bowel sounds are normal.  Musculoskeletal: Normal range of motion.  Neurological: He is alert and oriented to person, place, and time.  Psychiatric: He has a normal mood and affect.    ED Course  Procedures (including critical care time)  DIAGNOSTIC STUDIES: Oxygen Saturation is 95% on RA, adequate by my interpretation.    COORDINATION OF CARE: 5:04 PM-Discussed treatment plan which includes checking for corneal abrasions and ulcerations with pt at bedside and pt agreed to plan.   Labs Review Labs Reviewed  CBG MONITORING, ED - Abnormal; Notable for the following:    Glucose-Capillary 230 (*)    All other components within normal limits    Imaging Review No results found.   EKG Interpretation None      MDM   Final diagnoses:  Right corneal abrasion, initial encounter  Iritis   Filed Vitals:   02/26/14 1637 02/26/14 1929  BP: 170/96 165/78  Pulse: 85 84  Temp: 98.3 F (36.8 C)   TempSrc: Oral   Resp: 16 17  Height:  (1.803 m)   Weight: 330 lb (149.687 kg)   SpO2: 95% 97%    Medications  cyclopentolate (CYCLODRYL,CYCLOGYL) 1 % ophthalmic solution 1 drop (1 drop Right Eye Given 02/26/14 1924)    Kenneth Fox is a 43 y.o. male presenting with right eye erythema and foreign body sensation. Normal slit lamp exam except for small corneal abrasion. Patient has a traumatic iritis with consensual  photophobia.  Ophthalmology consult from Dr. Burgess Estelle appreciated: Advised of clinical scenario is and findings, he recommends Cyclogyl for comfort. Patient will be started on antibiotic drops and he will follow in the office.  Evaluation does not show pathology that would require ongoing emergent intervention or inpatient treatment. Pt is hemodynamically stable and mentating appropriately. Discussed findings and plan with patient/guardian, who agrees with care plan. All questions answered. Return precautions discussed and outpatient follow up given.   Discharge Medication List as of 02/26/2014  7:20 PM    START taking these medications   Details  cyclopentolate (CYCLOGYL) 1 % ophthalmic solution Place 1-2 drops into the right eye daily., Starting 02/26/2014, Until Discontinued, Print    trimethoprim-polymyxin b (POLYTRIM) ophthalmic solution Place 1 drop into the right eye every 4 (four) hours., Starting 02/26/2014, Until Discontinued, Print        I personally performed the services described in this documentation, which was scribed in my presence. The recorded information has been reviewed and is accurate.  Joni Reining Tanekia Ryans, PA-C 02/27/14 0150

## 2014-02-26 NOTE — ED Notes (Signed)
Pt presents with right eye redness- reports itching and drainage X 2 days.  Denies changes in vision.

## 2014-03-01 ENCOUNTER — Ambulatory Visit: Payer: Medicaid Other

## 2014-03-02 ENCOUNTER — Telehealth: Payer: Self-pay | Admitting: Internal Medicine

## 2014-03-02 NOTE — Telephone Encounter (Signed)
[facility] PT was told # not accepting calls at this time. WCB

## 2014-03-03 NOTE — ED Provider Notes (Signed)
Medical screening examination/treatment/procedure(s) were performed by non-physician practitioner and as supervising physician I was immediately available for consultation/collaboration.   EKG Interpretation None        Mirian Mo, MD 03/03/14 [phone]

## 2014-03-05 NOTE — Telephone Encounter (Signed)
His sleep study showed severe obstructive sleep apnea with response to CPAP.  I have never seen this patient, I just read the study. It was ordered by Dr Rickey Barbara, with the [facility]. The patient needs to contact him for follow up of this problem.

## 2014-03-05 NOTE — Telephone Encounter (Signed)
[email or web address] x1 

## 2014-03-05 NOTE — Telephone Encounter (Signed)
Pt returned call [phone]

## 2014-03-05 NOTE — Telephone Encounter (Signed)
Dr Maple Hudson please advise on sleep study results. Pt has study completed on 02/15/14. Calling and requesting results. Thanks.

## 2014-03-05 NOTE — Telephone Encounter (Signed)
lmomtcb  

## 2014-03-06 NOTE — Telephone Encounter (Signed)
Called and spoke to pt. Informed pt of results and recs per CY. Pt verbalized understanding and denied any further questions or concerns at this time. Nothing further needed.

## 2014-03-08 ENCOUNTER — Ambulatory Visit: Payer: Medicaid Other

## 2014-03-08 ENCOUNTER — Encounter (HOSPITAL_COMMUNITY): Payer: Self-pay | Admitting: Emergency Medicine

## 2014-03-08 ENCOUNTER — Emergency Department (HOSPITAL_COMMUNITY)
Admission: EM | Admit: 2014-03-08 | Discharge: 2014-03-08 | Disposition: A | Discharge status: Home / Self Care | Payer: Medicaid Other | Attending: Emergency Medicine | Admitting: Emergency Medicine

## 2014-03-08 ENCOUNTER — Emergency Department (HOSPITAL_COMMUNITY): Payer: Medicaid Other

## 2014-03-08 DIAGNOSIS — I10 Essential (primary) hypertension: Secondary | ICD-10-CM | POA: Diagnosis not present

## 2014-03-08 DIAGNOSIS — K219 Gastro-esophageal reflux disease without esophagitis: Secondary | ICD-10-CM | POA: Insufficient documentation

## 2014-03-08 DIAGNOSIS — R197 Diarrhea, unspecified: Secondary | ICD-10-CM | POA: Insufficient documentation

## 2014-03-08 DIAGNOSIS — M797 Fibromyalgia: Secondary | ICD-10-CM | POA: Diagnosis not present

## 2014-03-08 DIAGNOSIS — J45909 Unspecified asthma, uncomplicated: Secondary | ICD-10-CM | POA: Diagnosis not present

## 2014-03-08 DIAGNOSIS — Z794 Long term (current) use of insulin: Secondary | ICD-10-CM | POA: Diagnosis not present

## 2014-03-08 DIAGNOSIS — Z8659 Personal history of other mental and behavioral disorders: Secondary | ICD-10-CM | POA: Insufficient documentation

## 2014-03-08 DIAGNOSIS — E669 Obesity, unspecified: Secondary | ICD-10-CM | POA: Diagnosis not present

## 2014-03-08 DIAGNOSIS — R739 Hyperglycemia, unspecified: Secondary | ICD-10-CM

## 2014-03-08 DIAGNOSIS — R112 Nausea with vomiting, unspecified: Secondary | ICD-10-CM

## 2014-03-08 DIAGNOSIS — Z792 Long term (current) use of antibiotics: Secondary | ICD-10-CM | POA: Diagnosis not present

## 2014-03-08 DIAGNOSIS — E780 Pure hypercholesterolemia: Secondary | ICD-10-CM | POA: Diagnosis not present

## 2014-03-08 DIAGNOSIS — K3184 Gastroparesis: Secondary | ICD-10-CM

## 2014-03-08 DIAGNOSIS — R079 Chest pain, unspecified: Secondary | ICD-10-CM | POA: Diagnosis present

## 2014-03-08 DIAGNOSIS — E1143 Type 2 diabetes mellitus with diabetic autonomic (poly)neuropathy: Secondary | ICD-10-CM | POA: Insufficient documentation

## 2014-03-08 LAB — COMPREHENSIVE METABOLIC PANEL
ALBUMIN: 3 g/dL — AB (ref 3.5–5.2)
ALT: 13 U/L (ref 0–53)
AST: 16 U/L (ref 0–37)
Alkaline Phosphatase: 100 U/L (ref 39–117)
Anion gap: 15 (ref 5–15)
BUN: 6 mg/dL (ref 6–23)
CALCIUM: 9 mg/dL (ref 8.4–10.5)
CO2: 26 mEq/L (ref 19–32)
Chloride: 102 mEq/L (ref 96–112)
Creatinine, Ser: 0.99 mg/dL (ref 0.50–1.35)
GFR calc non Af Amer: >90 mL/min (ref >90)
Glucose, Bld: 296 mg/dL — ABNORMAL HIGH (ref 70–99)
Potassium: 3.7 mEq/L (ref 3.7–5.3)
SODIUM: 143 meq/L (ref 137–147)
TOTAL PROTEIN: 7.4 g/dL (ref 6.0–8.3)
Total Bilirubin: 0.3 mg/dL (ref 0.3–1.2)

## 2014-03-08 LAB — CBC WITH DIFFERENTIAL/PLATELET
BASOS PCT: 0 % (ref 0–1)
Basophils Absolute: 0 10*3/uL (ref 0.0–0.1)
EOS ABS: 0.1 10*3/uL (ref 0.0–0.7)
EOS PCT: 1 % (ref 0–5)
HCT: 43.5 % (ref 39.0–52.0)
Hemoglobin: 14.8 g/dL (ref 13.0–17.0)
Lymphocytes Relative: 23 % (ref 12–46)
Lymphs Abs: 2 10*3/uL (ref 0.7–4.0)
MCH: 27.9 pg (ref 26.0–34.0)
MCHC: 34 g/dL (ref 30.0–36.0)
MCV: 81.9 fL (ref 78.0–100.0)
Monocytes Absolute: 0.7 10*3/uL (ref 0.1–1.0)
Monocytes Relative: 8 % (ref 3–12)
Neutro Abs: 6 10*3/uL (ref 1.7–7.7)
Neutrophils Relative %: 68 % (ref 43–77)
Platelets: 263 10*3/uL (ref 150–400)
RBC: 5.31 MIL/uL (ref 4.22–5.81)
RDW: 13.6 % (ref 11.5–15.5)
WBC: 8.8 10*3/uL (ref 4.0–10.5)

## 2014-03-08 LAB — I-STAT CHEM 8, ED
BUN: 4 mg/dL — AB (ref 6–23)
CALCIUM ION: 1.1 mmol/L — AB (ref 1.12–1.23)
Chloride: 100 mEq/L (ref 96–112)
Creatinine, Ser: 1 mg/dL (ref 0.50–1.35)
Glucose, Bld: 304 mg/dL — ABNORMAL HIGH (ref 70–99)
HCT: 48 % (ref 39.0–52.0)
HEMOGLOBIN: 16.3 g/dL (ref 13.0–17.0)
Potassium: 3.4 mEq/L — ABNORMAL LOW (ref 3.7–5.3)
Sodium: 139 mEq/L (ref 137–147)
TCO2: 26 mmol/L (ref 0–100)

## 2014-03-08 LAB — LIPASE, BLOOD: Lipase: 58 U/L (ref 11–59)

## 2014-03-08 LAB — TROPONIN I: Troponin I: <0.3 ng/mL (ref <0.30)

## 2014-03-08 LAB — CBG MONITORING, ED: Glucose-Capillary: 260 mg/dL — ABNORMAL HIGH (ref 70–99)

## 2014-03-08 MED ORDER — ONDANSETRON HCL 4 MG PO TABS: 4.0000 mg | ORAL_TABLET | Freq: Four times a day (QID) | ORAL | Status: DC

## 2014-03-08 MED ORDER — SODIUM CHLORIDE 0.9 % IV BOLUS (SEPSIS)
1000.0000 mL | Freq: Once | INTRAVENOUS | Status: AC
  Administered 2014-03-08: 1000 mL via INTRAVENOUS

## 2014-03-08 MED ORDER — LORAZEPAM 2 MG/ML IJ SOLN
1.0000 mg | Freq: Once | INTRAMUSCULAR | Status: AC
  Administered 2014-03-08: 1 mg via INTRAVENOUS
  Filled 2014-03-08: qty 1

## 2014-03-08 MED ORDER — ONDANSETRON 8 MG/NS 50 ML IVPB
8.0000 mg | Freq: Once | INTRAVENOUS | Status: AC
  Administered 2014-03-08: 8 mg via INTRAVENOUS
  Filled 2014-03-08: qty 8

## 2014-03-08 MED ORDER — METOCLOPRAMIDE HCL 10 MG PO TABS: 10.0000 mg | ORAL_TABLET | Freq: Four times a day (QID) | ORAL | Status: DC

## 2014-03-08 MED ORDER — HYDROMORPHONE HCL 1 MG/ML IJ SOLN
1.0000 mg | Freq: Once | INTRAMUSCULAR | Status: AC
  Administered 2014-03-08: 1 mg via INTRAVENOUS
  Filled 2014-03-08: qty 1

## 2014-03-08 MED ORDER — METOCLOPRAMIDE HCL 5 MG/ML IJ SOLN
10.0000 mg | Freq: Once | INTRAMUSCULAR | Status: AC
Start: 2014-03-08 — End: 2014-03-08
  Administered 2014-03-08: 10 mg via INTRAVENOUS
  Filled 2014-03-08: qty 2

## 2014-03-08 NOTE — ED Notes (Signed)
Patient transported to X-ray 

## 2014-03-08 NOTE — ED Provider Notes (Signed)
CSN: [id number]     Arrival date & time 03/08/14  1400 History   First MD Initiated Contact with Patient 03/08/14 1406     Chief Complaint  Patient presents with  . Chest Pain     (Consider location/radiation/quality/duration/timing/severity/associated sxs/prior Treatment) HPI Kenneth Fox is a 43 y.o. male with PMH of IDDM, gastroparesis, chronic pain, GERD, fibromyalgia, depression and anxiety and obesity presenting with epigastric abdominal pain described as "being punched" for five days. Patient with nausea and intractable vomiting. Patient states he feels exactly as he did in June. No fevers, chills. No hematochezia. Last BM yesterday after taking miralax. He was patient reports he has not taken any of his insulin or oral DM medications. Patient admitted 6/27-6/30 with similar presentation. Patient found to have ARF, gastroparesis and OSA. Patient has been to the ED 11 times in 6 months. Recent imaging include stable cardiomegally 6/19, 6/28. Abdominal xray 6/16 with non specific bowel pattern. CT abdomen pelvis 6/11 with no changes from prior.  Patient also presents with chest tightness that is transient and lasts for 20 mins. He feels it diffusely in chest. Pain worse with eating. Not worse with exertion, deep breathing or movement. No shortness of breath, back pain, weakness. Patient with HTN, DM, obesity. No smoking, dyslipidemia, FM of cardiac death.    Past Medical History  Diagnosis Date  . Hypertension   . Neuropathy   . Anxiety   . Depression   . Sickle cell trait   . Gastroenteritis   . Obesity   . Constipation   . High cholesterol   . Asthma   . Chronic bronchitis     "get it q yr" (11/25/2013)  . Sleep apnea     "don't use CPAP" (11/25/2013)  . Type II diabetes mellitus   . GERD (gastroesophageal reflux disease)   . Stomach ulcer   . Fibromyalgia    Past Surgical History  Procedure Laterality Date  . Hip fracture surgery Right 1999    "put a plate in"    Family History  Problem Relation Age of Onset  . Diabetes Father   . Neuropathy Father   . Hypertension Father   . Sickle cell anemia Mother    History  Substance Use Topics  . Smoking status: Never Smoker   . Smokeless tobacco: Never Used  . Alcohol Use: No    Review of Systems  Constitutional: Negative for fever and chills.  HENT: Negative for congestion and rhinorrhea.   Eyes: Negative for visual disturbance.  Respiratory: Negative for cough and shortness of breath.   Cardiovascular: Positive for chest pain. Negative for palpitations.  Gastrointestinal: Positive for nausea and vomiting. Negative for diarrhea and blood in stool.  Genitourinary: Negative for dysuria and hematuria.  Musculoskeletal: Negative for back pain and gait problem.  Skin: Negative for rash.  Neurological: Negative for weakness and headaches.      Allergies  Lisinopril  Home Medications   Prior to Admission medications   Medication Sig Start Date End Date Taking? Authorizing Provider  insulin aspart (NOVOLOG) 100 UNIT/ML injection Inject 10-20 Units into the skin 3 (three) times daily with meals. per sliding scale CBG 70 - 120: 0 units CBG 121 - 150: 2 units CBG 151 - 200: 3 units CBG 201 - 250: 5 units CBG 251 - 300: 8 units CBG 301 - 350: 11 units CBG 351 - 400: 15 units 11/26/13  Yes Shanker Levora Dredge, MD  insulin detemir (LEVEMIR) 100 UNIT/ML injection  Inject 0.65 mLs (65 Units total) into the skin every morning. 12/05/13  Yes Richarda Overlie, MD  metoCLOPramide (REGLAN) 10 MG tablet Take 1 tablet (10 mg total) by mouth 3 (three) times daily before meals. 12/01/13  Yes Olivia Mackie, MD  omeprazole (PRILOSEC) 20 MG capsule Take 1 capsule (20 mg total) by mouth 2 (two) times daily. 12/01/13  Yes Olivia Mackie, MD  oxyCODONE (OXY IR/ROXICODONE) 5 MG immediate release tablet Take 1 tablet (5 mg total) by mouth every 4 (four) hours as needed for moderate pain. 11/26/13  Yes Shanker Levora Dredge, MD   polyethylene glycol [facility]) packet Take 17 g by mouth daily. 11/18/13  Yes Jerald Kief, MD  potassium chloride 20 MEQ TBCR Take 20 mEq by mouth daily. 11/26/13  Yes Shanker Levora Dredge, MD  pregabalin (LYRICA) 150 MG capsule Take 150 mg by mouth 3 (three) times daily.   Yes Historical Provider, MD  promethazine (PHENERGAN) 25 MG tablet Take 25 mg by mouth every 8 (eight) hours as needed for nausea or vomiting.    Yes Historical Provider, MD  trimethoprim-polymyxin b (POLYTRIM) ophthalmic solution Place 1 drop into the right eye every 4 (four) hours. 02/26/14  Yes Nicole Pisciotta, PA-C  ondansetron (ZOFRAN) 4 MG tablet Take 1 tablet (4 mg total) by mouth every 6 (six) hours. 03/08/14   Benetta Spar L Evamae Rowen, PA-C   BP 150/88  Pulse 84  Temp(Src) 97.7 F (36.5 C) (Oral)  Resp 12  Ht 5\' 11"  (1.803 m)  Wt 330 lb (149.687 kg)  BMI 46.05 kg/m2  SpO2 99% Physical Exam  Nursing note and vitals reviewed. Constitutional: He appears well-developed and well-nourished. No distress.  HENT:  Head: Normocephalic and atraumatic.  Eyes: Conjunctivae and EOM are normal. Right eye exhibits no discharge. Left eye exhibits no discharge.  Neck: No JVD present.  Cardiovascular: Normal rate, regular rhythm and normal heart sounds.   No peripheral swelling or tenderness.  Pulmonary/Chest: Effort normal and breath sounds normal. No respiratory distress. He has no wheezes.  Abdominal: Bowel sounds are normal. He exhibits no distension.  Obese abdomen soft, epigastric tenderness without rebound, guarding or signs of peritonitis.  Neurological: He is alert. He exhibits normal muscle tone. Coordination normal.  Skin: Skin is warm and dry. He is not diaphoretic.    ED Course  Procedures (including critical care time) Labs Review Labs Reviewed  COMPREHENSIVE METABOLIC PANEL - Abnormal; Notable for the following:    Glucose, Bld 296 (*)    Albumin 3.0 (*)    All other components within normal limits  CBG  MONITORING, ED - Abnormal; Notable for the following:    Glucose-Capillary 260 (*)    All other components within normal limits  I-STAT CHEM 8, ED - Abnormal; Notable for the following:    Potassium 3.4 (*)    BUN 4 (*)    Glucose, Bld 304 (*)    Calcium, Ion 1.10 (*)    All other components within normal limits  TROPONIN I  CBC WITH DIFFERENTIAL  LIPASE, BLOOD  CBG MONITORING, ED  CBG MONITORING, ED    Imaging Review Dg Chest 2 View  03/08/2014   CLINICAL DATA:  Acute onset shortness of breath  EXAM: CHEST  2 VIEW  COMPARISON:  December 03, 2013  FINDINGS: There is mild scarring in the right base. There is no edema or consolidation. Heart is mildly enlarged with pulmonary vascularity within normal limits. No adenopathy. No bone lesions.  IMPRESSION: Mild  cardiac enlargement. Mild scarring right base. No edema or consolidation.   Electronically Signed   By: Bretta BangWilliam  Woodruff M.D.   On: 03/08/2014 15:16     EKG Interpretation   Date/Time:  Thursday March 08 2014 14:03:33 EDT Ventricular Rate:  83 PR Interval:  161 QRS Duration: 92 QT Interval:  413 QTC Calculation: 485 R Axis:   -5 Text Interpretation:  Sinus rhythm Borderline prolonged QT interval No  significant change since last tracing Confirmed by YAO  MD, DAVID ([id number])  on 03/08/2014 2:32:13 PM     Meds given in ED:  Medications  sodium chloride 0.9 % bolus 1,000 mL (0 mLs Intravenous Stopped 03/08/14 1618)  metoCLOPramide (REGLAN) injection 10 mg (10 mg Intravenous Given 03/08/14 1451)  ondansetron (ZOFRAN) 8 mg/NS 50 ml IVPB (8 mg Intravenous Given 03/08/14 1618)  LORazepam (ATIVAN) injection 1 mg (1 mg Intravenous Given 03/08/14 1451)  HYDROmorphone (DILAUDID) injection 1 mg (1 mg Intravenous Given 03/08/14 1452)    New Prescriptions   ONDANSETRON (ZOFRAN) 4 MG TABLET    Take 1 tablet (4 mg total) by mouth every 6 (six) hours.      MDM   Final diagnoses:  Nausea vomiting and diarrhea  Hyperglycemia  Diabetic  gastroparesis   Chest pain is not likely of cardiac etiology d/t presentation, Low risk HEART score. VSS,  no JVD or new murmur, RRR, breath sounds equal bilaterally, EKG without acute abnormalities, negative troponin, and CXR without acute changes. Pt to take home PPI and return to the ED is CP becomes exertional, associated with diaphoresis or nausea, radiates to left jaw/arm, worsens or becomes concerning in any way. Patient given strict instructions to follow up with PCP.    Patient is nontoxic, nonseptic appearing, in no apparent distress.  Patient's pain and other symptoms completely resolved in emergency department.  Fluid bolus given.  Labs, imaging and vitals reviewed.  Patient does not meet the SIRS or Sepsis criteria.  On repeat exam patient does not have a surgical abdomin and there are no peritoneal signs.  No indication of appendicitis, bowel obstruction, bowel perforation, cholecystitis, diverticulitis. Patient tolerating fluids in ED. Patient discharged home with zofran and symptomatic treatment and given strict instructions for follow-up with their primary care physician.  I have also discussed reasons to return immediately to the ER.   Additionally patient not taking insulin and with hyperglycemia. Patient to take meds when he gets home and follow up with DM physician in one week as scheduled.   Discussed return precautions with patient. Discussed all results and patient verbalizes understanding and agrees with plan.  This is a shared patient. This patient was discussed with the physician, Dr. Silverio LayYao who saw and evaluated the patient and agrees with the plan.    Louann SjogrenVictoria L Reather Steller, PA-C 03/08/14 1701

## 2014-03-08 NOTE — Discharge Instructions (Signed)
Return to the emergency room with worsening of symptoms, new symptoms or with symptoms that are concerning, especially fevers, abdominal pain, unable to keep fluids down.  TAKE YOUR Diabetes INSULIN when you get home. Follow up with DM physician in one week as scheduled. Take zofran as needed for nausea and/or vomiting.  BRAT diet: bananas, RICE, applesauce, toast then advance diet as tolerated Please call your doctor for a followup appointment within 24-48 hours. When you talk to your doctor please let them know that you were seen in the emergency department and have them acquire all of your records so that they can discuss the findings with you and formulate a treatment plan to fully care for your new and ongoing problems.

## 2014-03-08 NOTE — ED Notes (Signed)
Chest pain x1 week; abdominal pain also x1 week

## 2014-03-09 ENCOUNTER — Encounter (HOSPITAL_COMMUNITY): Payer: Self-pay | Admitting: Emergency Medicine

## 2014-03-09 ENCOUNTER — Emergency Department (HOSPITAL_COMMUNITY): Payer: Medicaid Other

## 2014-03-09 ENCOUNTER — Emergency Department (HOSPITAL_COMMUNITY)
Admission: EM | Admit: 2014-03-09 | Discharge: 2014-03-09 | Disposition: A | Discharge status: Home / Self Care | Payer: Medicaid Other | Attending: Emergency Medicine | Admitting: Emergency Medicine

## 2014-03-09 ENCOUNTER — Inpatient Hospital Stay (HOSPITAL_COMMUNITY)
Admission: EM | Admit: 2014-03-09 | Discharge: 2014-03-12 | DRG: 074 | Disposition: A | Discharge status: Home / Self Care | Payer: Medicaid Other | Attending: Internal Medicine | Admitting: Internal Medicine

## 2014-03-09 DIAGNOSIS — R739 Hyperglycemia, unspecified: Secondary | ICD-10-CM

## 2014-03-09 DIAGNOSIS — Z8711 Personal history of peptic ulcer disease: Secondary | ICD-10-CM

## 2014-03-09 DIAGNOSIS — M797 Fibromyalgia: Secondary | ICD-10-CM | POA: Insufficient documentation

## 2014-03-09 DIAGNOSIS — F419 Anxiety disorder, unspecified: Secondary | ICD-10-CM | POA: Diagnosis present

## 2014-03-09 DIAGNOSIS — E669 Obesity, unspecified: Secondary | ICD-10-CM

## 2014-03-09 DIAGNOSIS — Z862 Personal history of diseases of the blood and blood-forming organs and certain disorders involving the immune mechanism: Secondary | ICD-10-CM | POA: Diagnosis not present

## 2014-03-09 DIAGNOSIS — Z833 Family history of diabetes mellitus: Secondary | ICD-10-CM

## 2014-03-09 DIAGNOSIS — Z8659 Personal history of other mental and behavioral disorders: Secondary | ICD-10-CM | POA: Insufficient documentation

## 2014-03-09 DIAGNOSIS — G589 Mononeuropathy, unspecified: Secondary | ICD-10-CM | POA: Insufficient documentation

## 2014-03-09 DIAGNOSIS — R197 Diarrhea, unspecified: Secondary | ICD-10-CM

## 2014-03-09 DIAGNOSIS — E1165 Type 2 diabetes mellitus with hyperglycemia: Secondary | ICD-10-CM | POA: Diagnosis present

## 2014-03-09 DIAGNOSIS — N179 Acute kidney failure, unspecified: Secondary | ICD-10-CM

## 2014-03-09 DIAGNOSIS — I10 Essential (primary) hypertension: Secondary | ICD-10-CM | POA: Insufficient documentation

## 2014-03-09 DIAGNOSIS — J45909 Unspecified asthma, uncomplicated: Secondary | ICD-10-CM | POA: Diagnosis not present

## 2014-03-09 DIAGNOSIS — D573 Sickle-cell trait: Secondary | ICD-10-CM | POA: Diagnosis present

## 2014-03-09 DIAGNOSIS — E0865 Diabetes mellitus due to underlying condition with hyperglycemia: Secondary | ICD-10-CM

## 2014-03-09 DIAGNOSIS — Z6841 Body Mass Index (BMI) 40.0 and over, adult: Secondary | ICD-10-CM

## 2014-03-09 DIAGNOSIS — E876 Hypokalemia: Secondary | ICD-10-CM | POA: Diagnosis present

## 2014-03-09 DIAGNOSIS — E785 Hyperlipidemia, unspecified: Secondary | ICD-10-CM | POA: Diagnosis present

## 2014-03-09 DIAGNOSIS — K219 Gastro-esophageal reflux disease without esophagitis: Secondary | ICD-10-CM | POA: Diagnosis present

## 2014-03-09 DIAGNOSIS — K3184 Gastroparesis: Secondary | ICD-10-CM

## 2014-03-09 DIAGNOSIS — E109 Type 1 diabetes mellitus without complications: Secondary | ICD-10-CM | POA: Diagnosis not present

## 2014-03-09 DIAGNOSIS — Z794 Long term (current) use of insulin: Secondary | ICD-10-CM

## 2014-03-09 DIAGNOSIS — Z79899 Other long term (current) drug therapy: Secondary | ICD-10-CM

## 2014-03-09 DIAGNOSIS — R1010 Upper abdominal pain, unspecified: Secondary | ICD-10-CM

## 2014-03-09 DIAGNOSIS — R112 Nausea with vomiting, unspecified: Secondary | ICD-10-CM | POA: Diagnosis present

## 2014-03-09 DIAGNOSIS — E1143 Type 2 diabetes mellitus with diabetic autonomic (poly)neuropathy: Secondary | ICD-10-CM | POA: Diagnosis present

## 2014-03-09 DIAGNOSIS — R1115 Cyclical vomiting syndrome unrelated to migraine: Secondary | ICD-10-CM

## 2014-03-09 DIAGNOSIS — K5901 Slow transit constipation: Secondary | ICD-10-CM

## 2014-03-09 DIAGNOSIS — F329 Major depressive disorder, single episode, unspecified: Secondary | ICD-10-CM | POA: Diagnosis present

## 2014-03-09 DIAGNOSIS — R1013 Epigastric pain: Secondary | ICD-10-CM

## 2014-03-09 DIAGNOSIS — G4733 Obstructive sleep apnea (adult) (pediatric): Secondary | ICD-10-CM | POA: Diagnosis present

## 2014-03-09 DIAGNOSIS — Z9989 Dependence on other enabling machines and devices: Secondary | ICD-10-CM

## 2014-03-09 DIAGNOSIS — G43A1 Cyclical vomiting, intractable: Secondary | ICD-10-CM

## 2014-03-09 DIAGNOSIS — R9431 Abnormal electrocardiogram [ECG] [EKG]: Secondary | ICD-10-CM

## 2014-03-09 DIAGNOSIS — R1084 Generalized abdominal pain: Secondary | ICD-10-CM

## 2014-03-09 DIAGNOSIS — Z832 Family history of diseases of the blood and blood-forming organs and certain disorders involving the immune mechanism: Secondary | ICD-10-CM

## 2014-03-09 LAB — CBC WITH DIFFERENTIAL/PLATELET
BASOS ABS: 0 10*3/uL (ref 0.0–0.1)
BASOS PCT: 0 % (ref 0–1)
BASOS PCT: 0 % (ref 0–1)
Basophils Absolute: 0 10*3/uL (ref 0.0–0.1)
EOS ABS: 0.1 10*3/uL (ref 0.0–0.7)
Eosinophils Absolute: 0.1 10*3/uL (ref 0.0–0.7)
Eosinophils Relative: 1 % (ref 0–5)
Eosinophils Relative: 1 % (ref 0–5)
HCT: 41.5 % (ref 39.0–52.0)
HCT: 41.3 % (ref 39.0–52.0)
Hemoglobin: 14 g/dL (ref 13.0–17.0)
Hemoglobin: 13.7 g/dL (ref 13.0–17.0)
LYMPHS ABS: 1 10*3/uL (ref 0.7–4.0)
Lymphocytes Relative: 12 % (ref 12–46)
Lymphocytes Relative: 16 % (ref 12–46)
Lymphs Abs: 1.2 10*3/uL (ref 0.7–4.0)
MCH: 27.8 pg (ref 26.0–34.0)
MCH: 27.3 pg (ref 26.0–34.0)
MCHC: 33.7 g/dL (ref 30.0–36.0)
MCHC: 33.2 g/dL (ref 30.0–36.0)
MCV: 82.3 fL (ref 78.0–100.0)
MCV: 82.4 fL (ref 78.0–100.0)
MONOS PCT: 6 % (ref 3–12)
Monocytes Absolute: 0.5 10*3/uL (ref 0.1–1.0)
Monocytes Absolute: 0.7 10*3/uL (ref 0.1–1.0)
Monocytes Relative: 10 % (ref 3–12)
NEUTROS ABS: 6.7 10*3/uL (ref 1.7–7.7)
NEUTROS ABS: 5.5 10*3/uL (ref 1.7–7.7)
NEUTROS PCT: 73 % (ref 43–77)
Neutrophils Relative %: 81 % — ABNORMAL HIGH (ref 43–77)
PLATELETS: 259 10*3/uL (ref 150–400)
PLATELETS: 252 10*3/uL (ref 150–400)
RBC: 5.04 MIL/uL (ref 4.22–5.81)
RBC: 5.01 MIL/uL (ref 4.22–5.81)
RDW: 13.5 % (ref 11.5–15.5)
RDW: 13.5 % (ref 11.5–15.5)
WBC: 8.3 10*3/uL (ref 4.0–10.5)
WBC: 7.5 10*3/uL (ref 4.0–10.5)

## 2014-03-09 LAB — COMPREHENSIVE METABOLIC PANEL
ALBUMIN: 2.9 g/dL — AB (ref 3.5–5.2)
ALBUMIN: 2.9 g/dL — AB (ref 3.5–5.2)
ALT: 8 U/L (ref 0–53)
ALT: 12 U/L (ref 0–53)
ANION GAP: 11 (ref 5–15)
AST: 11 U/L (ref 0–37)
AST: 13 U/L (ref 0–37)
Alkaline Phosphatase: 92 U/L (ref 39–117)
Alkaline Phosphatase: 93 U/L (ref 39–117)
Anion gap: 12 (ref 5–15)
BILIRUBIN TOTAL: 0.3 mg/dL (ref 0.3–1.2)
BUN: 6 mg/dL (ref 6–23)
BUN: 5 mg/dL — ABNORMAL LOW (ref 6–23)
CO2: 27 mEq/L (ref 19–32)
CO2: 30 mEq/L (ref 19–32)
CREATININE: 0.96 mg/dL (ref 0.50–1.35)
Calcium: 8.7 mg/dL (ref 8.4–10.5)
Calcium: 8.7 mg/dL (ref 8.4–10.5)
Chloride: 98 mEq/L (ref 96–112)
Chloride: 99 mEq/L (ref 96–112)
Creatinine, Ser: 0.97 mg/dL (ref 0.50–1.35)
GFR calc Af Amer: >90 mL/min (ref >90)
GFR calc non Af Amer: >90 mL/min (ref >90)
Glucose, Bld: 352 mg/dL — ABNORMAL HIGH (ref 70–99)
Glucose, Bld: 253 mg/dL — ABNORMAL HIGH (ref 70–99)
POTASSIUM: 3.2 meq/L — AB (ref 3.7–5.3)
Potassium: 3.4 mEq/L — ABNORMAL LOW (ref 3.7–5.3)
Sodium: 137 mEq/L (ref 137–147)
Sodium: 140 mEq/L (ref 137–147)
TOTAL PROTEIN: 7 g/dL (ref 6.0–8.3)
Total Bilirubin: 0.3 mg/dL (ref 0.3–1.2)
Total Protein: 7 g/dL (ref 6.0–8.3)

## 2014-03-09 LAB — LIPASE, BLOOD: LIPASE: 36 U/L (ref 11–59)

## 2014-03-09 LAB — CBG MONITORING, ED: GLUCOSE-CAPILLARY: 256 mg/dL — AB (ref 70–99)

## 2014-03-09 LAB — GLUCOSE, CAPILLARY
GLUCOSE-CAPILLARY: 186 mg/dL — AB (ref 70–99)
GLUCOSE-CAPILLARY: 203 mg/dL — AB (ref 70–99)

## 2014-03-09 MED ORDER — INSULIN DETEMIR 100 UNIT/ML ~~LOC~~ SOLN
30.0000 [IU] | Freq: Every day | SUBCUTANEOUS | Status: DC
  Administered 2014-03-10: 30 [IU] via SUBCUTANEOUS
  Filled 2014-03-09 (×2): qty 0.3

## 2014-03-09 MED ORDER — MORPHINE SULFATE 4 MG/ML IJ SOLN
4.0000 mg | Freq: Once | INTRAMUSCULAR | Status: AC
  Administered 2014-03-09: 4 mg via INTRAVENOUS
  Filled 2014-03-09: qty 1

## 2014-03-09 MED ORDER — SODIUM CHLORIDE 0.9 % IV BOLUS (SEPSIS)
1000.0000 mL | Freq: Once | INTRAVENOUS | Status: AC
  Administered 2014-03-09: 1000 mL via INTRAVENOUS

## 2014-03-09 MED ORDER — PROMETHAZINE HCL 25 MG/ML IJ SOLN
12.5000 mg | Freq: Once | INTRAMUSCULAR | Status: AC
  Administered 2014-03-09: 12.5 mg via INTRAVENOUS
  Filled 2014-03-09: qty 1

## 2014-03-09 MED ORDER — SODIUM CHLORIDE 0.9 % IV SOLN: INTRAVENOUS | Status: AC

## 2014-03-09 MED ORDER — HYDROMORPHONE HCL 1 MG/ML IJ SOLN
0.5000 mg | INTRAMUSCULAR | Status: DC | PRN
  Administered 2014-03-10 – 2014-03-11 (×6): 1 mg via INTRAVENOUS
  Filled 2014-03-09 (×8): qty 1

## 2014-03-09 MED ORDER — INSULIN ASPART 100 UNIT/ML ~~LOC~~ SOLN
0.0000 [IU] | SUBCUTANEOUS | Status: DC
  Administered 2014-03-09: 3 [IU] via SUBCUTANEOUS
  Administered 2014-03-10 (×2): 2 [IU] via SUBCUTANEOUS
  Administered 2014-03-10: 5 [IU] via SUBCUTANEOUS
  Administered 2014-03-10: 3 [IU] via SUBCUTANEOUS
  Administered 2014-03-10: 5 [IU] via SUBCUTANEOUS
  Administered 2014-03-10: 3 [IU] via SUBCUTANEOUS
  Administered 2014-03-10: 2 [IU] via SUBCUTANEOUS
  Administered 2014-03-11: 5 [IU] via SUBCUTANEOUS

## 2014-03-09 MED ORDER — METOCLOPRAMIDE HCL 5 MG/ML IJ SOLN
10.0000 mg | Freq: Once | INTRAMUSCULAR | Status: AC
  Administered 2014-03-09: 10 mg via INTRAVENOUS
  Filled 2014-03-09: qty 2

## 2014-03-09 MED ORDER — DEXTROSE 50 % IV SOLN: 50.0000 mL | Freq: Once | INTRAVENOUS | Status: AC | PRN

## 2014-03-09 MED ORDER — OXYCODONE HCL 5 MG PO TABS
5.0000 mg | ORAL_TABLET | ORAL | Status: DC | PRN
  Administered 2014-03-10: 5 mg via ORAL
  Filled 2014-03-09: qty 1

## 2014-03-09 MED ORDER — SODIUM CHLORIDE 0.9 % IV SOLN
INTRAVENOUS | Status: DC
  Administered 2014-03-09 – 2014-03-11 (×5): via INTRAVENOUS

## 2014-03-09 MED ORDER — ACETAMINOPHEN 650 MG RE SUPP: 650.0000 mg | Freq: Four times a day (QID) | RECTAL | Status: DC | PRN

## 2014-03-09 MED ORDER — PROMETHAZINE HCL 25 MG/ML IJ SOLN
25.0000 mg | Freq: Once | INTRAMUSCULAR | Status: AC
  Administered 2014-03-09: 25 mg via INTRAVENOUS
  Filled 2014-03-09: qty 1

## 2014-03-09 MED ORDER — POLYMYXIN B-TRIMETHOPRIM 10000-0.1 UNIT/ML-% OP SOLN
1.0000 [drp] | OPHTHALMIC | Status: DC
  Administered 2014-03-09 – 2014-03-12 (×16): 1 [drp] via OPHTHALMIC
  Filled 2014-03-09: qty 10

## 2014-03-09 MED ORDER — ACETAMINOPHEN 325 MG PO TABS: 650.0000 mg | ORAL_TABLET | Freq: Four times a day (QID) | ORAL | Status: DC | PRN

## 2014-03-09 MED ORDER — ONDANSETRON HCL 4 MG/2ML IJ SOLN
4.0000 mg | Freq: Once | INTRAMUSCULAR | Status: AC
  Administered 2014-03-09: 4 mg via INTRAVENOUS
  Filled 2014-03-09: qty 2

## 2014-03-09 MED ORDER — ONDANSETRON HCL 4 MG PO TABS: 4.0000 mg | ORAL_TABLET | Freq: Four times a day (QID) | ORAL | Status: DC | PRN

## 2014-03-09 MED ORDER — DEXTROSE 50 % IV SOLN: 25.0000 mL | Freq: Once | INTRAVENOUS | Status: AC | PRN

## 2014-03-09 MED ORDER — HYDROMORPHONE HCL 1 MG/ML IJ SOLN
1.0000 mg | INTRAMUSCULAR | Status: DC | PRN
  Administered 2014-03-09: 1 mg via INTRAVENOUS
  Filled 2014-03-09: qty 1

## 2014-03-09 MED ORDER — METOCLOPRAMIDE HCL 5 MG/ML IJ SOLN
10.0000 mg | Freq: Four times a day (QID) | INTRAMUSCULAR | Status: DC
  Administered 2014-03-09 – 2014-03-10 (×2): 10 mg via INTRAVENOUS
  Filled 2014-03-09 (×3): qty 2

## 2014-03-09 MED ORDER — ONDANSETRON HCL 4 MG/2ML IJ SOLN
4.0000 mg | Freq: Three times a day (TID) | INTRAMUSCULAR | Status: AC | PRN
Start: 2014-03-09 — End: 2014-03-10

## 2014-03-09 MED ORDER — ENOXAPARIN SODIUM 40 MG/0.4ML ~~LOC~~ SOLN
40.0000 mg | SUBCUTANEOUS | Status: DC
  Administered 2014-03-09: 40 mg via SUBCUTANEOUS
  Filled 2014-03-09: qty 0.4

## 2014-03-09 MED ORDER — PANTOPRAZOLE SODIUM 40 MG IV SOLR
40.0000 mg | INTRAVENOUS | Status: DC
  Administered 2014-03-09 – 2014-03-11 (×3): 40 mg via INTRAVENOUS
  Filled 2014-03-09 (×5): qty 40

## 2014-03-09 MED ORDER — ONDANSETRON HCL 4 MG/2ML IJ SOLN
4.0000 mg | Freq: Four times a day (QID) | INTRAMUSCULAR | Status: DC | PRN
  Administered 2014-03-10: 4 mg via INTRAVENOUS
  Filled 2014-03-09: qty 2

## 2014-03-09 MED ORDER — ALUM & MAG HYDROXIDE-SIMETH 200-200-20 MG/5ML PO SUSP
30.0000 mL | Freq: Four times a day (QID) | ORAL | Status: DC | PRN
Start: 2014-03-09 — End: 2014-03-12

## 2014-03-09 MED ORDER — HYDRALAZINE HCL 20 MG/ML IJ SOLN
10.0000 mg | Freq: Four times a day (QID) | INTRAMUSCULAR | Status: DC | PRN
  Administered 2014-03-10 (×2): 10 mg via INTRAVENOUS
  Filled 2014-03-09 (×2): qty 1

## 2014-03-09 NOTE — ED Provider Notes (Signed)
CSN: [id number]     Arrival date & time 03/09/14  1521 History   First MD Initiated Contact with Patient 03/09/14 1526     Chief Complaint  Patient presents with  . Abdominal Pain     (Consider location/radiation/quality/duration/timing/severity/associated sxs/prior Treatment) The history is provided by the patient and medical records.    Pt with hx DM, gastroparesis, chronic pain, presents with diffuse sharp abdominal pain with radiation into his chest, N/V, that has been ongoing x 1 week.  Is taking his regular medications without improvement.  Has been to the ED 3 times in 2 days with symptom improvement each time but feels worse as soon as "the medication wears off."  Pain is worse with lying flat, better in other positions, also better after vomiting.  Pain lasts hours, comes and goes.Takes zofran, phenergan, and reglan at home, all PO.  Has vomited once today, twice yesterday.  Is not eating because he is "scared."  Returns today for continued pain and nausea.  No PR medications at home.  Denies fevers, SOB, cough, change in bowel movements.  Had loose stools yesterday, is able to pass flatus.  Has never had abdominal surgery.   Depression unchanged.  Denies SI.   Past Medical History  Diagnosis Date  . Hypertension   . Neuropathy   . Anxiety   . Depression   . Sickle cell trait   . Gastroenteritis   . Obesity   . Constipation   . High cholesterol   . Asthma   . Chronic bronchitis     "get it q yr" (11/25/2013)  . Sleep apnea     "don't use CPAP" (11/25/2013)  . Type II diabetes mellitus   . GERD (gastroesophageal reflux disease)   . Stomach ulcer   . Fibromyalgia    Past Surgical History  Procedure Laterality Date  . Hip fracture surgery Right 1999    "put a plate in"   Family History  Problem Relation Age of Onset  . Diabetes Father   . Neuropathy Father   . Hypertension Father   . Sickle cell anemia Mother    History  Substance Use Topics  . Smoking status:  Never Smoker   . Smokeless tobacco: Never Used  . Alcohol Use: No    Review of Systems  All other systems reviewed and are negative.     Allergies  Lisinopril  Home Medications   Prior to Admission medications   Medication Sig Start Date End Date Taking? Authorizing Provider  insulin aspart (NOVOLOG) 100 UNIT/ML injection Inject 10-20 Units into the skin 3 (three) times daily with meals. per sliding scale CBG 70 - 120: 0 units CBG 121 - 150: 2 units CBG 151 - 200: 3 units CBG 201 - 250: 5 units CBG 251 - 300: 8 units CBG 301 - 350: 11 units CBG 351 - 400: 15 units 11/26/13   Shanker Levora DredgeM Ghimire, MD  insulin detemir (LEVEMIR) 100 UNIT/ML injection Inject 0.65 mLs (65 Units total) into the skin every morning. 12/05/13   Richarda OverlieNayana Abrol, MD  metoCLOPramide (REGLAN) 10 MG tablet Take 1 tablet (10 mg total) by mouth every 6 (six) hours. 03/08/14   Louann SjogrenVictoria L Creech, PA-C  omeprazole (PRILOSEC) 20 MG capsule Take 1 capsule (20 mg total) by mouth 2 (two) times daily. 12/01/13   Olivia Mackielga M Otter, MD  ondansetron (ZOFRAN) 4 MG tablet Take 1 tablet (4 mg total) by mouth every 6 (six) hours. 03/08/14   Louann SjogrenVictoria L Creech,  PA-C  oxyCODONE (OXY IR/ROXICODONE) 5 MG immediate release tablet Take 1 tablet (5 mg total) by mouth every 4 (four) hours as needed for moderate pain. 11/26/13   Shanker Levora Dredge, MD  polyethylene glycol  [facility]) packet Take 17 g by mouth daily. 11/18/13   Jerald Kief, MD  potassium chloride 20 MEQ TBCR Take 20 mEq by mouth daily. 11/26/13   Shanker Levora Dredge, MD  pregabalin (LYRICA) 150 MG capsule Take 150 mg by mouth 3 (three) times daily.    Historical Provider, MD  promethazine (PHENERGAN) 25 MG tablet Take 25 mg by mouth every 8 (eight) hours as needed for nausea or vomiting.     Historical Provider, MD  trimethoprim-polymyxin b (POLYTRIM) ophthalmic solution Place 1 drop into the right eye every 4 (four) hours. 02/26/14   Nicole Pisciotta, PA-C   BP 172/93  Pulse 83  Resp 15   Ht 5\' 11"  (1.803 m)  Wt 332 lb (150.594 kg)  BMI 46.33 kg/m2  SpO2 96% Physical Exam  Nursing note and vitals reviewed. Constitutional: He appears well-developed and well-nourished. No distress.  HENT:  Head: Normocephalic and atraumatic.  Mouth/Throat: Mucous membranes are dry.  Neck: Neck supple.  Cardiovascular: Normal rate and regular rhythm.   Pulmonary/Chest: Effort normal and breath sounds normal. No respiratory distress. He has no wheezes. He has no rales.  Abdominal: Soft. He exhibits no distension. There is tenderness. There is no rebound and no guarding.  Obese.  Diffuse tenderness throughout abdomen.   Neurological: He is alert. He exhibits normal muscle tone.  Skin: He is not diaphoretic.    ED Course  Procedures (including critical care time) Labs Review Labs Reviewed  COMPREHENSIVE METABOLIC PANEL - Abnormal; Notable for the following:    Potassium 3.2 (*)    Glucose, Bld 253 (*)    BUN 5 (*)    Albumin 2.9 (*)    All other components within normal limits  GLUCOSE, CAPILLARY - Abnormal; Notable for the following:    Glucose-Capillary 203 (*)    All other components within normal limits  GLUCOSE, CAPILLARY - Abnormal; Notable for the following:    Glucose-Capillary 186 (*)    All other components within normal limits  CBG MONITORING, ED - Abnormal; Notable for the following:    Glucose-Capillary 256 (*)    All other components within normal limits  CBC WITH DIFFERENTIAL  LIPASE, BLOOD  HEMOGLOBIN A1C  BASIC METABOLIC PANEL  CBC    Imaging Review Dg Chest 2 View  03/08/2014   CLINICAL DATA:  Acute onset shortness of breath  EXAM: CHEST  2 VIEW  COMPARISON:  December 03, 2013  FINDINGS: There is mild scarring in the right base. There is no edema or consolidation. Heart is mildly enlarged with pulmonary vascularity within normal limits. No adenopathy. No bone lesions.  IMPRESSION: Mild cardiac enlargement. Mild scarring right base. No edema or consolidation.    Electronically Signed   By: Bretta Bang M.D.   On: 03/08/2014 15:16     EKG Interpretation   Date/Time:  Friday March 09 2014 15:29:15 EDT Ventricular Rate:  82 PR Interval:  167 QRS Duration: 99 QT Interval:  424 QTC Calculation: 495 R Axis:   42 Text Interpretation:  Sinus rhythm Consider anterior infarct Baseline  wander in lead(s) II V5 Nonspecific T wave abnormality Borderline QT  interval No significant change since last tracing Confirmed by [facility]  MD,  Jonny Ruiz ([id number]) on 03/09/2014 3:42:44 PM  4:00 PM Discussed patient with Dr Fonnie Jarvis who has also seen and spoken with the patient.    5:29 PM Pt reports no change, still feels "queazy"  6:42 PM Patient states he continues to feel "like crap."  No improvement.  Abdominal exam unchanged.   8:11 PM Discussed pt with Dr Lovell Sheehan who will admit the patient for observation and further treatment.    MDM   Final diagnoses:  Gastroparesis  Essential hypertension  Pain of upper abdomen    Afebrile, nontoxic patient with type 1 diabetes, gastroparesis, chronic pain.  Pt has been seen in the ED 12 times in 6 months including 3 times in 2 days for similar symptoms.  Pt's symptoms unrelieved with pain and nausea medications in ED.  Refuses PO trial due to uncontrolled nausea and pain.  Clinically dry.  Admit for continued IV hydration and symptom management.  [facility] to admit.      Cesar Chavez, PA-C 03/09/14 2354

## 2014-03-09 NOTE — ED Notes (Signed)
Patient states he has abdominal pain since last Friday that feels tight like he got punched, sharp pain with nausea. Pain is upper mid. Patient also points to his chest when he describes his pain, states it radiates from abdomen to his midsternal area.     Has been taking reglan every meal, has been taking zofran PRN for nausea.  States that the only thing that has been working is the morphine he receives here.  Took oxycodone at home but it makes him queezy.    The pain that the patient is experiencing is the same as he has always had with gastroparesis, but the pain is worse.  The pain is intermittent.

## 2014-03-09 NOTE — ED Notes (Signed)
Pt. reports persistent nausea / vomitting with generalized abdominal pain onset last night , seen here yesterday diagnosed with gastroparesis/emesis prescribed with Zofran and Reglan with no relief.

## 2014-03-09 NOTE — ED Notes (Signed)
PT monitored by pulse ox, bp cuff, and 12-lead. 

## 2014-03-09 NOTE — ED Provider Notes (Signed)
Medical screening examination/treatment/procedure(s) were performed by non-physician practitioner and as supervising physician I was immediately available for consultation/collaboration.   EKG Interpretation   Date/Time:  Thursday March 08 2014 14:03:33 EDT Ventricular Rate:  83 PR Interval:  161 QRS Duration: 92 QT Interval:  413 QTC Calculation: 485 R Axis:   -5 Text Interpretation:  Sinus rhythm Borderline prolonged QT interval No  significant change since last tracing Confirmed by YAO  MD, DAVID ([id number])  on 03/08/2014 2:32:13 PM      Patient was discussed with me, but I didn't see the patient. I reviewed PA's records and his medical records and agree with plan.   Richardean Canalavid H Yao, MD 03/09/14 [phone]

## 2014-03-09 NOTE — ED Provider Notes (Signed)
CSN: [id number]     Arrival date & time 03/09/14  0224 History   First MD Initiated Contact with Patient 03/09/14 0305     Chief Complaint  Patient presents with  . Emesis     (Consider location/radiation/quality/duration/timing/severity/associated sxs/prior Treatment) HPI Comments: Patient is a 43 year old male with history of type 1 diabetes and gastroparesis. He presents to the ER frequently with episodes of abdominal pain, nausea, and vomiting. He was here yesterday and discharged after receiving fluids and medications. His symptoms returned this evening and he presents again for evaluation. He complains of severe nausea vomiting along with upper abdominal pain.  Patient is a 43 y.o. male presenting with vomiting. The history is provided by the patient.  Emesis Severity:  Severe Duration:  1 hour Timing:  Constant Progression:  Worsening Chronicity:  Recurrent Relieved by:  Nothing Worsened by:  Nothing tried Ineffective treatments:  None tried   Past Medical History  Diagnosis Date  . Hypertension   . Neuropathy   . Anxiety   . Depression   . Sickle cell trait   . Gastroenteritis   . Obesity   . Constipation   . High cholesterol   . Asthma   . Chronic bronchitis     "get it q yr" (11/25/2013)  . Sleep apnea     "don't use CPAP" (11/25/2013)  . Type II diabetes mellitus   . GERD (gastroesophageal reflux disease)   . Stomach ulcer   . Fibromyalgia    Past Surgical History  Procedure Laterality Date  . Hip fracture surgery Right 1999    "put a plate in"   Family History  Problem Relation Age of Onset  . Diabetes Father   . Neuropathy Father   . Hypertension Father   . Sickle cell anemia Mother    History  Substance Use Topics  . Smoking status: Never Smoker   . Smokeless tobacco: Never Used  . Alcohol Use: No    Review of Systems  Gastrointestinal: Positive for vomiting.  All other systems reviewed and are negative.     Allergies   Lisinopril  Home Medications   Prior to Admission medications   Medication Sig Start Date End Date Taking? Authorizing Provider  insulin aspart (NOVOLOG) 100 UNIT/ML injection Inject 10-20 Units into the skin 3 (three) times daily with meals. per sliding scale CBG 70 - 120: 0 units CBG 121 - 150: 2 units CBG 151 - 200: 3 units CBG 201 - 250: 5 units CBG 251 - 300: 8 units CBG 301 - 350: 11 units CBG 351 - 400: 15 units 11/26/13   Shanker Levora DredgeM Ghimire, MD  insulin detemir (LEVEMIR) 100 UNIT/ML injection Inject 0.65 mLs (65 Units total) into the skin every morning. 12/05/13   Richarda OverlieNayana Abrol, MD  metoCLOPramide (REGLAN) 10 MG tablet Take 1 tablet (10 mg total) by mouth 3 (three) times daily before meals. 12/01/13   Olivia Mackielga M Otter, MD  metoCLOPramide (REGLAN) 10 MG tablet Take 1 tablet (10 mg total) by mouth every 6 (six) hours. 03/08/14   Louann SjogrenVictoria L Creech, PA-C  omeprazole (PRILOSEC) 20 MG capsule Take 1 capsule (20 mg total) by mouth 2 (two) times daily. 12/01/13   Olivia Mackielga M Otter, MD  ondansetron (ZOFRAN) 4 MG tablet Take 1 tablet (4 mg total) by mouth every 6 (six) hours. 03/08/14   Louann SjogrenVictoria L Creech, PA-C  oxyCODONE (OXY IR/ROXICODONE) 5 MG immediate release tablet Take 1 tablet (5 mg total) by mouth every 4 (four) hours  as needed for moderate pain. 11/26/13   Shanker Levora Dredge, MD  polyethylene glycol [facility]) packet Take 17 g by mouth daily. 11/18/13   Jerald Kief, MD  potassium chloride 20 MEQ TBCR Take 20 mEq by mouth daily. 11/26/13   Shanker Levora Dredge, MD  pregabalin (LYRICA) 150 MG capsule Take 150 mg by mouth 3 (three) times daily.    Historical Provider, MD  promethazine (PHENERGAN) 25 MG tablet Take 25 mg by mouth every 8 (eight) hours as needed for nausea or vomiting.     Historical Provider, MD  trimethoprim-polymyxin b (POLYTRIM) ophthalmic solution Place 1 drop into the right eye every 4 (four) hours. 02/26/14   Nicole Pisciotta, PA-C   BP 159/93  Pulse 101  Temp(Src) 97.9 F (36.6 C)  (Oral)  Resp 24  Ht 5\' 11"  (1.803 m)  Wt 332 lb (150.594 kg)  BMI 46.33 kg/m2  SpO2 96% Physical Exam  Nursing note and vitals reviewed. Constitutional: He is oriented to person, place, and time. He appears well-developed and well-nourished. No distress.  HENT:  Head: Normocephalic and atraumatic.  Mouth/Throat: Oropharynx is clear and moist.  Neck: Normal range of motion. Neck supple.  Cardiovascular: Normal rate, regular rhythm and normal heart sounds.   No murmur heard. Pulmonary/Chest: Effort normal and breath sounds normal. No respiratory distress. He has no wheezes.  Abdominal: Soft. Bowel sounds are normal. He exhibits no distension. There is tenderness. There is no rebound and no guarding.  There is tenderness to palpation in the epigastric region.  Musculoskeletal: Normal range of motion. He exhibits no edema.  Neurological: He is alert and oriented to person, place, and time.  Skin: Skin is warm and dry. He is not diaphoretic.    ED Course  Procedures (including critical care time) Labs Review Labs Reviewed  CBC WITH DIFFERENTIAL  COMPREHENSIVE METABOLIC PANEL    Imaging Review Dg Chest 2 View  03/08/2014   CLINICAL DATA:  Acute onset shortness of breath  EXAM: CHEST  2 VIEW  COMPARISON:  December 03, 2013  FINDINGS: There is mild scarring in the right base. There is no edema or consolidation. Heart is mildly enlarged with pulmonary vascularity within normal limits. No adenopathy. No bone lesions.  IMPRESSION: Mild cardiac enlargement. Mild scarring right base. No edema or consolidation.   Electronically Signed   By: Bretta Bang M.D.   On: 03/08/2014 15:16     EKG Interpretation None      MDM   Final diagnoses:  None    Patient with history of gastroparesis secondary to diabetes. He presents with complaints of nausea and vomiting with generalized abdominal pain. He was given medications which helped his symptoms. He was discharged but is vomiting soon  returned. He was given IV fluids, Reglan, morphine, and Phenergan and is now resting very comfortably. His electrolytes reveal no evidence for DKA and I feel as though he is appropriate for discharge.    Geoffery Lyons, MD 03/09/14 [phone]

## 2014-03-09 NOTE — ED Notes (Signed)
Patient presents today via [facility] with abdominal pain unchanged from previous visit to this facility this morning and yesterday.  VSS.  Patient has gastroparesis. Patient states he has nausea

## 2014-03-09 NOTE — ED Notes (Signed)
CBG 256.[name].[name].Dr. Fonnie JarvisBednar and RN notified

## 2014-03-09 NOTE — ED Provider Notes (Signed)
Medical screening examination/treatment/procedure(s) were conducted as a shared visit with non-physician practitioner(s) and myself.  I personally evaluated the patient during the encounter.   EKG Interpretation   Date/Time:  Friday March 09 2014 15:29:15 EDT Ventricular Rate:  82 PR Interval:  167 QRS Duration: 99 QT Interval:  424 QTC Calculation: 495 R Axis:   42 Text Interpretation:  Sinus rhythm Consider anterior infarct Baseline  wander in lead(s) II V5 Nonspecific T wave abnormality Borderline QT  interval No significant change since last tracing [facility]  [facility],  Jonny RuizJOHN ([id number]) on 03/09/2014 3:42:2444 PM     43 year old male with chronic severe abdominal pain with gastroparesis presents with exacerbation of pain the last few days had several loose bowel movements a couple days ago yesterday had normal bowel movements today no bowel movement but still passing flatus, states she's vomited twice nonbloody emesis, his pain is constant severe epigastric like prior episodes with multiple prior CT scans as well as abnormal gastric emptying study consistent with gastroparesis, patient states he has not improved despite his use of Reglan, Zofran, Phenergan, and oxycodone for his chronic gastroparesis chronic severe abdominal pain syndrome. Patient had elevated blood sugar approximately 350 this morning at his last ED visit but had a normal anion gap without diabetic ketoacidosis. Patient returns to the emergency room this afternoon after discharge from the emergency department earlier today stating he is unable to tolerate oral intake.  Hurman HornJohn M Estel Tonelli, MD 03/12/14 1800

## 2014-03-09 NOTE — ED Notes (Signed)
Urine sample collected and placed at bedside for possible order

## 2014-03-09 NOTE — Discharge Instructions (Signed)
Continue your medications as before.  Followup with your primary Dr. in the next week for a recheck.  Return to the emergency department if he develops severe pain, bloody stool, or other new and concerning symptoms.   Gastroparesis  Gastroparesis is also called slowed stomach emptying (delayed gastric emptying). It is a condition in which the stomach takes too long to empty its contents. It often happens in people with diabetes.  CAUSES  Gastroparesis happens when nerves to the stomach are damaged or stop working. When the nerves are damaged, the muscles of the stomach and intestines do not work normally. The movement of food is slowed or stopped. High blood glucose (sugar) causes changes in nerves and can damage the blood vessels that carry oxygen and nutrients to the nerves. RISK FACTORS  Diabetes.  Post-viral syndromes.  Eating disorders (anorexia, bulimia).  Surgery on the stomach or vagus nerve.  Gastroesophageal reflux disease (rarely).  Smooth muscle disorders (amyloidosis, scleroderma).  Metabolic disorders, including hypothyroidism.  Parkinson disease. SYMPTOMS   Heartburn.  Feeling sick to your stomach (nausea).  Vomiting of undigested food.  An early feeling of fullness when eating.  Weight loss.  Abdominal bloating.  Erratic blood glucose levels.  Lack of appetite.  Gastroesophageal reflux.  Spasms of the stomach wall. Complications can include:  Bacterial overgrowth in stomach. Food stays in the stomach and can ferment and cause bacteria to grow.  Weight loss due to difficulty digesting and absorbing nutrients.  Vomiting.  Obstruction in the stomach. Undigested food can harden and cause nausea and vomiting.  Blood glucose fluctuations caused by inconsistent food absorption. DIAGNOSIS  The diagnosis of gastroparesis is confirmed through one or more of the following tests:  Barium X-rays and scans. These tests look at how long it takes for  food to move through the stomach.  Gastric manometry. This test measures electrical and muscular activity in the stomach. A thin tube is passed down the throat into the stomach. The tube contains a wire that takes measurements of the stomach's electrical and muscular activity as it digests liquids and solid food.  Endoscopy. This procedure is done with a long, thin tube called an endoscope. It is passed through the mouth and gently down the esophagus into the stomach. This tube helps the caregiver look at the lining of the stomach to check for any abnormalities.  Ultrasonography. This can rule out gallbladder disease or pancreatitis. This test will outline and define the shape of the gallbladder and pancreas. TREATMENT   Treatments may include:  Exercise.  Medicines to control nausea and vomiting.  Medicines to stimulate stomach muscles.  Changes in what and when you eat.  Having smaller meals more often.  Eating low-fiber forms of high-fiber foods, such as eating cooked vegetables instead of raw vegetables.  Eating low-fat foods.  Consuming liquids, which are easier to digest.  In severe cases, feeding tubes and intravenous (IV) feeding may be needed. It is important to note that in most cases, treatment does not cure gastroparesis. It is usually a lasting (chronic) condition. Treatment helps you manage the underlying condition so that you can be as healthy and comfortable as possible. Other treatments  A gastric neurostimulator has been developed to assist people with gastroparesis. The battery-operated device is surgically implanted. It emits mild electrical pulses to help improve stomach emptying and to control nausea and vomiting.  The use of botulinum toxin has been shown to improve stomach emptying by decreasing the prolonged contractions of the muscle  between the stomach and the small intestine (pyloric sphincter). The benefits are temporary. SEEK MEDICAL CARE IF:   You  have diabetes and you are having problems keeping your blood glucose in goal range.  You are having nausea, vomiting, bloating, or early feelings of fullness with eating.  Your symptoms do not change with a change in diet. Document Released: 05/25/2005 Document Revised: 09/19/2012 Document Reviewed: 11/01/2008 [facility] Patient Information 2015 [employer] [place]. This information is not intended to replace advice given to you by your health care provider. Make sure you discuss any questions you have with your health care provider.

## 2014-03-09 NOTE — H&P (Signed)
[facility] Admission History and Physical       Kenneth Fox WJX:[id number] DOB: 12-Aug-1970 DOA: 03/09/2014  Referring physician: EDP PCP: Pcp Not In System  Specialists:   Chief Complaint: Nausea and Vomiting and ABD Pain  HPI: Kenneth Fox is a 43 y.o. male with a history of DM2, HTN, hyperlipidemia, Diabetic Gastroparesis, Diabetic Neuropathy, and OSA who presents to the ED with complaints of increased nausea and vomiting x 1 week.  He reports having sharp epigastric ABD pain. He has not been able to hold down foods , liquids or his medications.   He denies any hematemesis, diarrhea, constipation, and fever.    He has been seen in the ED 3 times for his symptoms.  He has not had relief with his home medications.    Review of Systems:  Constitutional: No Weight Loss, No Weight Gain, Night Sweats, Fevers, Chills, Dizziness, Fatigue, or Generalized Weakness HEENT: No Headaches, Difficulty Swallowing,Tooth/Dental Problems,Sore Throat,  No Sneezing, Rhinitis, Ear Ache, Nasal Congestion, or Post Nasal Drip,  Cardio-vascular:  No Chest pain, Orthopnea, PND, Edema in Lower Extremities, Anasarca, Dizziness, Palpitations  Resp: No Dyspnea, No DOE, No Cough, No Hemoptysis, No Wheezing.    GI: No Heartburn, Indigestion, +Epigastric Abdominal Pain, +Nausea, +Vomiting, Diarrhea, Hematemesis, Hematochezia, Melena, Change in Bowel Habits,  Loss of Appetite  GU: No Dysuria, Change in Color of Urine, No Urgency or Frequency, No Flank pain.  Musculoskeletal: No Joint Pain or Swelling, No Decreased Range of Motion, No Back Pain.  Neurologic: No Syncope, No Seizures, Muscle Weakness, Paresthesia, Vision Disturbance or Loss, No Diplopia, No Vertigo, No Difficulty Walking,  Skin: No Rash or Lesions. Psych: No Change in Mood or Affect, No Depression or Anxiety, No Memory loss, No Confusion, or Hallucinations   Past Medical History  Diagnosis Date  . Hypertension   . Neuropathy   .  Anxiety   . Depression   . Sickle cell trait   . Gastroenteritis   . Obesity   . Constipation   . High cholesterol   . Asthma   . Chronic bronchitis     "get it q yr" (11/25/2013)  . Sleep apnea     "don't use CPAP" (11/25/2013)  . Type II diabetes mellitus   . GERD (gastroesophageal reflux disease)   . Stomach ulcer   . Fibromyalgia       Past Surgical History  Procedure Laterality Date  . Hip fracture surgery Right 1999    "put a plate in"       Prior to Admission medications   Medication Sig Start Date End Date Taking? Authorizing Provider  insulin aspart (NOVOLOG) 100 UNIT/ML injection Inject 10-20 Units into the skin 3 (three) times daily with meals. per sliding scale CBG 70 - 120: 0 units CBG 121 - 150: 2 units CBG 151 - 200: 3 units CBG 201 - 250: 5 units CBG 251 - 300: 8 units CBG 301 - 350: 11 units CBG 351 - 400: 15 units 11/26/13   Shanker Levora Dredge, MD  insulin detemir (LEVEMIR) 100 UNIT/ML injection Inject 0.65 mLs (65 Units total) into the skin every morning. 12/05/13   Richarda Overlie, MD  metoCLOPramide (REGLAN) 10 MG tablet Take 1 tablet (10 mg total) by mouth every 6 (six) hours. 03/08/14   Louann Sjogren, PA-C  omeprazole (PRILOSEC) 20 MG capsule Take 1 capsule (20 mg total) by mouth 2 (two) times daily. 12/01/13   Olivia Mackie, MD  ondansetron [facility]) 4  MG tablet Take 1 tablet (4 mg total) by mouth every 6 (six) hours. 03/08/14   Louann Sjogren, PA-C  oxyCODONE (OXY IR/ROXICODONE) 5 MG immediate release tablet Take 1 tablet (5 mg total) by mouth every 4 (four) hours as needed for moderate pain. 11/26/13   Shanker Levora Dredge, MD  polyethylene glycol [facility]) packet Take 17 g by mouth daily. 11/18/13   Jerald Kief, MD  potassium chloride 20 MEQ TBCR Take 20 mEq by mouth daily. 11/26/13   Shanker Levora Dredge, MD  pregabalin (LYRICA) 150 MG capsule Take 150 mg by mouth 3 (three) times daily.    Historical Provider, MD  promethazine (PHENERGAN) 25 MG tablet  Take 25 mg by mouth every 8 (eight) hours as needed for nausea or vomiting.     Historical Provider, MD  trimethoprim-polymyxin b (POLYTRIM) ophthalmic solution Place 1 drop into the right eye every 4 (four) hours. 02/26/14   Nicole Pisciotta, PA-C      Allergies  Allergen Reactions  . Lisinopril Other (See Comments)    Patient developed AKI after being on lisinopril for a week.     Social History:  reports that he has never smoked. He has never used smokeless tobacco. He reports that he does not drink alcohol or use illicit drugs.     Family History  Problem Relation Age of Onset  . Diabetes Father   . Neuropathy Father   . Hypertension Father   . Sickle cell anemia Mother        Physical Exam:  GEN:  Pleasant Morbidly Obese 43 y.o. African American male examined and in Discomfort but no acute distress; cooperative with exam Filed Vitals:   03/09/14 1600 03/09/14 1800 03/09/14 1900 03/09/14 2000  BP: 190/98 193/111 192/95 182/85  Pulse: 81 83 85 88  Resp: 16 16 16    Height:      Weight:      SpO2: 85% 97% 96% 97%   Blood pressure 182/85, pulse 88, resp. rate 16, height 5\' 11"  (1.803 m), weight 150.594 kg (332 lb), SpO2 97.00%. PSYCH: SHe is alert and oriented x4; does not appear anxious does not appear depressed; affect is normal HEENT: Normocephalic and Atraumatic, Mucous membranes pink; PERRLA; EOM intact; Fundi:  Benign;  No scleral icterus, Nares: Patent, Oropharynx: Clear, Fair Dentition,    Neck:  FROM, No Cervical Lymphadenopathy nor Thyromegaly or Carotid Bruit; No JVD; Breasts:: Not examined CHEST WALL: No tenderness CHEST: Normal respiration, clear to auscultation bilaterally HEART: Regular rate and rhythm; no murmurs rubs or gallops BACK: No kyphosis or scoliosis; No CVA tenderness ABDOMEN: Positive Bowel Sounds,  Obese, Soft. Mildly tender in epigastrium,  No Masses, No Organomegaly Rectal Exam: Not done EXTREMITIES: No Cyanosis, Clubbing, or Edema; No  Ulcerations. Genitalia: not examined PULSES: 2+ and symmetric SKIN: Normal hydration no rash or ulceration CNS:  Alert and Oriented x 4, No focal deficits  Vascular: pulses palpable throughout    Labs on Admission:  Basic Metabolic Panel:  Recent Labs Lab 03/08/14 1444 03/08/14 1451 03/09/14 0325 03/09/14 1618  NA 143 139 137 140  K 3.7 3.4* 3.4* 3.2*  CL 102 100 98 99  CO2 26  --  27 30  GLUCOSE 296* 304* 352* 253*  BUN 6 4* 6 5*  CREATININE 0.99 1.00 0.96 0.97  CALCIUM 9.0  --  8.7 8.7   Liver Function Tests:  Recent Labs Lab 03/08/14 1444 03/09/14 0325 03/09/14 1618  AST 16 11 13   ALT  13 8 12   ALKPHOS 100 92 93  BILITOT 0.3 0.3 0.3  PROT 7.4 7.0 7.0  ALBUMIN 3.0* 2.9* 2.9*    Recent Labs Lab 03/08/14 1444 03/09/14 1618  LIPASE 58 36   No results found for this basename: AMMONIA,  in the last 168 hours CBC:  Recent Labs Lab 03/08/14 1429 03/08/14 1451 03/09/14 0325 03/09/14 1618  WBC 8.8  --  8.3 7.5  NEUTROABS 6.0  --  6.7 5.5  HGB 14.8 16.3 14.0 13.7  HCT 43.5 48.0 41.5 41.3  MCV 81.9  --  82.3 82.4  PLT 263  --  259 252   Cardiac Enzymes:  Recent Labs Lab 03/08/14 1423  TROPONINI <0.30    BNP (last 3 results)  Recent Labs  11/24/13 2012  PROBNP 72.7   CBG:  Recent Labs Lab 03/08/14 1625 03/09/14 1551  GLUCAP 260* 256*    Radiological Exams on Admission: Dg Chest 2 View  03/08/2014   CLINICAL DATA:  Acute onset shortness of breath  EXAM: CHEST  2 VIEW  COMPARISON:  December 03, 2013  FINDINGS: There is mild scarring in the right base. There is no edema or consolidation. Heart is mildly enlarged with pulmonary vascularity within normal limits. No adenopathy. No bone lesions.  IMPRESSION: Mild cardiac enlargement. Mild scarring right base. No edema or consolidation.   Electronically Signed   By: Bretta BangWilliam  Woodruff M.D.   On: 03/08/2014 15:16   Dg Abd Acute W/chest  03/09/2014   CLINICAL DATA:  43 year old male with abdominal  pain in all quadrants, nausea and vomiting. Initial encounter.  EXAM: ACUTE ABDOMEN SERIES (ABDOMEN 2 VIEW & CHEST 1 VIEW)  COMPARISON:  Abdominal series 11/24/2013 and earlier. Chest radiographs 03/08/2014 and earlier.  FINDINGS: Semi upright AP view of the chest at 1645 hrs. Large body habitus. Chronic infrahilar atelectasis. Stable cardiomegaly and mediastinal contours. No pneumothorax or pneumoperitoneum. Visualized tracheal air column is within normal limits.  Left-side-down lateral decubitus view is of the abdomen. No pneumoperitoneum. Non obstructed bowel gas pattern. Chronic ORIF changes along the right acetabulum and pelvis. No acute osseous abnormality identified.  IMPRESSION: 1.  Non obstructed bowel gas pattern, no free air. 2. Stable cardiomegaly and infrahilar atelectasis.   Electronically Signed   By: Augusto GambleLee  Hall M.D.   On: 03/09/2014 17:03     EKG: Independently reviewed. Normal Sinus Rhythm No Acute S-T changes,  Rate 82   Assessment/Plan:   43 y.o. male with  Principal Problem:   1.   Nausea and vomiting   IV Zofran PRN   NPO except for Ice Chips    IVFs    2.   Diabetic gastroparesis   IV Reglan q 6 hrs x 4 doses    If improved change to PO     3.   Abdominal pain   PRN IV Dilaudid     4.   Hyperglycemia   I/2 dose Levemir Daily while NPO   SSI coverage PRN   Check HbA1c in AM     5.   Hypokalemia- due to N+V   Replete IV     6.   Hypertension   PRN IV Hydralazine     7.   Obstructive sleep apnea   CPAP qhs   Monitor O2 Sats     8.   DVT prophylaxis    Lovenox       Code Status:     FULL CODE  Family Communication:  No Family at  Bedside   Disposition Plan:     Observation  Time spent:  60 Minutes  Ron Parker [facility] Pager [phone]   If 7AM -7PM Please Contact the Day Rounding Team MD for [facility]  If 7PM-7AM, Please Contact Night-Floor Coverage  [email or web address] Password [facility] 03/09/2014, 8:54 PM

## 2014-03-09 NOTE — ED Notes (Signed)
Notified PA of BP

## 2014-03-09 NOTE — ED Notes (Signed)
Patient transported to X-ray 

## 2014-03-10 ENCOUNTER — Encounter (HOSPITAL_COMMUNITY): Payer: Self-pay | Admitting: *Deleted

## 2014-03-10 ENCOUNTER — Inpatient Hospital Stay (HOSPITAL_COMMUNITY): Payer: Medicaid Other

## 2014-03-10 DIAGNOSIS — D573 Sickle-cell trait: Secondary | ICD-10-CM | POA: Diagnosis present

## 2014-03-10 DIAGNOSIS — G4733 Obstructive sleep apnea (adult) (pediatric): Secondary | ICD-10-CM | POA: Diagnosis present

## 2014-03-10 DIAGNOSIS — Z794 Long term (current) use of insulin: Secondary | ICD-10-CM | POA: Diagnosis not present

## 2014-03-10 DIAGNOSIS — Z8711 Personal history of peptic ulcer disease: Secondary | ICD-10-CM | POA: Diagnosis not present

## 2014-03-10 DIAGNOSIS — F329 Major depressive disorder, single episode, unspecified: Secondary | ICD-10-CM | POA: Diagnosis present

## 2014-03-10 DIAGNOSIS — K219 Gastro-esophageal reflux disease without esophagitis: Secondary | ICD-10-CM | POA: Diagnosis present

## 2014-03-10 DIAGNOSIS — Z833 Family history of diabetes mellitus: Secondary | ICD-10-CM | POA: Diagnosis not present

## 2014-03-10 DIAGNOSIS — M797 Fibromyalgia: Secondary | ICD-10-CM | POA: Diagnosis present

## 2014-03-10 DIAGNOSIS — E1165 Type 2 diabetes mellitus with hyperglycemia: Secondary | ICD-10-CM | POA: Diagnosis present

## 2014-03-10 DIAGNOSIS — E785 Hyperlipidemia, unspecified: Secondary | ICD-10-CM | POA: Diagnosis present

## 2014-03-10 DIAGNOSIS — Z832 Family history of diseases of the blood and blood-forming organs and certain disorders involving the immune mechanism: Secondary | ICD-10-CM | POA: Diagnosis not present

## 2014-03-10 DIAGNOSIS — Z79899 Other long term (current) drug therapy: Secondary | ICD-10-CM | POA: Diagnosis not present

## 2014-03-10 DIAGNOSIS — E1143 Type 2 diabetes mellitus with diabetic autonomic (poly)neuropathy: Secondary | ICD-10-CM | POA: Diagnosis present

## 2014-03-10 DIAGNOSIS — K3184 Gastroparesis: Secondary | ICD-10-CM | POA: Diagnosis not present

## 2014-03-10 DIAGNOSIS — Z6841 Body Mass Index (BMI) 40.0 and over, adult: Secondary | ICD-10-CM | POA: Diagnosis not present

## 2014-03-10 DIAGNOSIS — E669 Obesity, unspecified: Secondary | ICD-10-CM | POA: Diagnosis present

## 2014-03-10 DIAGNOSIS — F419 Anxiety disorder, unspecified: Secondary | ICD-10-CM | POA: Diagnosis present

## 2014-03-10 DIAGNOSIS — I10 Essential (primary) hypertension: Secondary | ICD-10-CM | POA: Diagnosis present

## 2014-03-10 DIAGNOSIS — E876 Hypokalemia: Secondary | ICD-10-CM | POA: Diagnosis present

## 2014-03-10 LAB — CBC
HEMATOCRIT: 41 % (ref 39.0–52.0)
HEMOGLOBIN: 13.5 g/dL (ref 13.0–17.0)
MCH: 27.4 pg (ref 26.0–34.0)
MCHC: 32.9 g/dL (ref 30.0–36.0)
MCV: 83.2 fL (ref 78.0–100.0)
Platelets: 233 10*3/uL (ref 150–400)
RBC: 4.93 MIL/uL (ref 4.22–5.81)
RDW: 13.6 % (ref 11.5–15.5)
WBC: 7.2 10*3/uL (ref 4.0–10.5)

## 2014-03-10 LAB — GLUCOSE, CAPILLARY
GLUCOSE-CAPILLARY: 158 mg/dL — AB (ref 70–99)
Glucose-Capillary: 165 mg/dL — ABNORMAL HIGH (ref 70–99)
Glucose-Capillary: 225 mg/dL — ABNORMAL HIGH (ref 70–99)
Glucose-Capillary: 241 mg/dL — ABNORMAL HIGH (ref 70–99)
Glucose-Capillary: 255 mg/dL — ABNORMAL HIGH (ref 70–99)
Glucose-Capillary: 284 mg/dL — ABNORMAL HIGH (ref 70–99)

## 2014-03-10 LAB — BASIC METABOLIC PANEL
Anion gap: 11 (ref 5–15)
BUN: 5 mg/dL — AB (ref 6–23)
CALCIUM: 8.4 mg/dL (ref 8.4–10.5)
CHLORIDE: 104 meq/L (ref 96–112)
CO2: 27 mEq/L (ref 19–32)
CREATININE: 1.02 mg/dL (ref 0.50–1.35)
GFR calc Af Amer: >90 mL/min (ref >90)
GFR calc non Af Amer: 87 mL/min — ABNORMAL LOW (ref >90)
Glucose, Bld: 170 mg/dL — ABNORMAL HIGH (ref 70–99)
Potassium: 3.4 mEq/L — ABNORMAL LOW (ref 3.7–5.3)
Sodium: 142 mEq/L (ref 137–147)

## 2014-03-10 MED ORDER — HYDRALAZINE HCL 20 MG/ML IJ SOLN: 10.0000 mg | Freq: Once | INTRAMUSCULAR | Status: DC

## 2014-03-10 MED ORDER — ENOXAPARIN SODIUM 80 MG/0.8ML ~~LOC~~ SOLN
75.0000 mg | SUBCUTANEOUS | Status: DC
  Administered 2014-03-10 – 2014-03-11 (×2): 75 mg via SUBCUTANEOUS
  Filled 2014-03-10 (×4): qty 0.8

## 2014-03-10 MED ORDER — "THROMBI-PAD 3""X3"" EX PADS": 1.0000 | MEDICATED_PAD | Freq: Once | CUTANEOUS | Status: DC

## 2014-03-10 MED ORDER — IOHEXOL 300 MG/ML  SOLN
100.0000 mL | Freq: Once | INTRAMUSCULAR | Status: AC | PRN
  Administered 2014-03-10: 100 mL via INTRAVENOUS

## 2014-03-10 MED ORDER — HYDRALAZINE HCL 20 MG/ML IJ SOLN
20.0000 mg | Freq: Four times a day (QID) | INTRAMUSCULAR | Status: DC | PRN
  Administered 2014-03-10: 20 mg via INTRAVENOUS
  Filled 2014-03-10: qty 1

## 2014-03-10 MED ORDER — METOCLOPRAMIDE HCL 5 MG/ML IJ SOLN
10.0000 mg | Freq: Four times a day (QID) | INTRAMUSCULAR | Status: AC
  Administered 2014-03-10 – 2014-03-11 (×4): 10 mg via INTRAVENOUS
  Filled 2014-03-10 (×2): qty 2

## 2014-03-10 MED ORDER — IOHEXOL 300 MG/ML  SOLN
25.0000 mL | INTRAMUSCULAR | Status: AC
  Administered 2014-03-10 (×2): 25 mL via ORAL

## 2014-03-10 NOTE — Progress Notes (Signed)
UR completed 

## 2014-03-10 NOTE — Progress Notes (Signed)
PROGRESS NOTE  Kenneth Fox ZOX:[id number] DOB: 07/31/1970 DOA: 03/09/2014 PCP: Pcp Not In System  Assessment/Plan: Nausea and vomiting most likely due to diabetic gastroparesis IV Zofran PRN  clears IVFs   Diabetic gastroparesis  IV Reglan q 6 hrs x 4 doses  -change to PO in AM  Abdominal pain  Resolved Avoid dilaudid as it will worsen gastroparesis - if returns, will get CT scan abd/pelvis  Hyperglycemia  I/2 dose Levemir Daily until eating well SSI coverage PRN   Hypokalemia- due to N+V  Replete IV    Hypertension  PRN IV Hydralazine    Obstructive sleep apnea  CPAP qhs  Monitor O2 Sats   Code Status: full Family Communication: patient Disposition Plan: home in aM?   Consultants:    Procedures:      HPI/Subjective: No SOB, no CP, no fever, no chills No abd pain Ready to try food  Objective: Filed Vitals:   03/10/14 0635  BP: 128/58  Pulse: 76  Temp:   Resp:     Intake/Output Summary (Last 24 hours) at 03/10/14 0757 Last data filed at 03/09/14 2201  Gross per 24 hour  Intake    300 ml  Output   1800 ml  Net  -1500 ml   Filed Weights   03/09/14 1530 03/09/14 2130  Weight: 150.594 kg (332 lb) 150.594 kg (332 lb)    Exam:   General:  A+Ox3, NAD  Cardiovascular: rrr  Respiratory: clear  Abdomen: +BS, obese  Musculoskeletal: moves all 4 ext   Data Reviewed: Basic Metabolic Panel:  Recent Labs Lab 03/08/14 1444 03/08/14 1451 03/09/14 0325 03/09/14 1618 03/10/14 0544  NA 143 139 137 140 142  K 3.7 3.4* 3.4* 3.2* 3.4*  CL 102 100 98 99 104  CO2 26  --  27 30 27   GLUCOSE 296* 304* 352* 253* 170*  BUN 6 4* 6 5* 5*  CREATININE 0.99 1.00 0.96 0.97 1.02  CALCIUM 9.0  --  8.7 8.7 8.4   Liver Function Tests:  Recent Labs Lab 03/08/14 1444 03/09/14 0325 03/09/14 1618  AST 16 11 13   ALT 13 8 12   ALKPHOS 100 92 93  BILITOT 0.3 0.3 0.3  PROT 7.4 7.0 7.0  ALBUMIN 3.0* 2.9* 2.9*    Recent Labs Lab  03/08/14 1444 03/09/14 1618  LIPASE 58 36   No results found for this basename: AMMONIA,  in the last 168 hours CBC:  Recent Labs Lab 03/08/14 1429 03/08/14 1451 03/09/14 0325 03/09/14 1618 03/10/14 0544  WBC 8.8  --  8.3 7.5 7.2  NEUTROABS 6.0  --  6.7 5.5  --   HGB 14.8 16.3 14.0 13.7 13.5  HCT 43.5 48.0 41.5 41.3 41.0  MCV 81.9  --  82.3 82.4 83.2  PLT 263  --  259 252 233   Cardiac Enzymes:  Recent Labs Lab 03/08/14 1423  TROPONINI <0.30   BNP (last 3 results)  Recent Labs  11/24/13 2012  PROBNP 72.7   CBG:  Recent Labs Lab 03/09/14 1551 03/09/14 2137 03/09/14 2336 03/10/14 0328 03/10/14 0734  GLUCAP 256* 203* 186* 158* 165*    No results found for this or any previous visit (from the past 240 hour(s)).   Studies: Dg Chest 2 View  03/08/2014   CLINICAL DATA:  Acute onset shortness of breath  EXAM: CHEST  2 VIEW  COMPARISON:  December 03, 2013  FINDINGS: There is mild scarring in the right base. There is no edema or  consolidation. Heart is mildly enlarged with pulmonary vascularity within normal limits. No adenopathy. No bone lesions.  IMPRESSION: Mild cardiac enlargement. Mild scarring right base. No edema or consolidation.   Electronically Signed   By: Bretta BangWilliam  Woodruff M.D.   On: 03/08/2014 15:16   Dg Abd Acute W/chest  03/09/2014   CLINICAL DATA:  43 year old male with abdominal pain in all quadrants, nausea and vomiting. Initial encounter.  EXAM: ACUTE ABDOMEN SERIES (ABDOMEN 2 VIEW & CHEST 1 VIEW)  COMPARISON:  Abdominal series 11/24/2013 and earlier. Chest radiographs 03/08/2014 and earlier.  FINDINGS: Semi upright AP view of the chest at 1645 hrs. Large body habitus. Chronic infrahilar atelectasis. Stable cardiomegaly and mediastinal contours. No pneumothorax or pneumoperitoneum. Visualized tracheal air column is within normal limits.  Left-side-down lateral decubitus view is of the abdomen. No pneumoperitoneum. Non obstructed bowel gas pattern. Chronic  ORIF changes along the right acetabulum and pelvis. No acute osseous abnormality identified.  IMPRESSION: 1.  Non obstructed bowel gas pattern, no free air. 2. Stable cardiomegaly and infrahilar atelectasis.   Electronically Signed   By: Augusto GambleLee  Hall M.D.   On: 03/09/2014 17:03    Scheduled Meds: . sodium chloride   Intravenous STAT  . enoxaparin (LOVENOX) injection  75 mg Subcutaneous Q24H  . insulin aspart  0-9 Units Subcutaneous 6 times per day  . insulin detemir  30 Units Subcutaneous Daily  . metoCLOPramide (REGLAN) injection  10 mg Intravenous 4 times per day  . pantoprazole (PROTONIX) IV  40 mg Intravenous Q24H  . trimethoprim-polymyxin b  1 drop Right Eye Q4H   Continuous Infusions: . sodium chloride 100 mL/hr at 03/09/14 2201   Antibiotics Given (last 72 hours)   None      Principal Problem:   Nausea and vomiting Active Problems:   Hyperglycemia   Hypertension   Obstructive sleep apnea   Abdominal pain   Diabetic gastroparesis   Hypokalemia    Time spent: 25 min    Kenneth Fox  [facility] Pager [phone]. If 7PM-7AM, please contact night-coverage at [email or web address], password [email or web address] 03/10/2014, 7:57 AM  LOS: 1 day

## 2014-03-11 DIAGNOSIS — K5901 Slow transit constipation: Secondary | ICD-10-CM

## 2014-03-11 DIAGNOSIS — R1084 Generalized abdominal pain: Secondary | ICD-10-CM

## 2014-03-11 DIAGNOSIS — K3184 Gastroparesis: Secondary | ICD-10-CM

## 2014-03-11 LAB — CBC
HCT: 43.4 % (ref 39.0–52.0)
Hemoglobin: 14.5 g/dL (ref 13.0–17.0)
MCH: 27.5 pg (ref 26.0–34.0)
MCHC: 33.4 g/dL (ref 30.0–36.0)
MCV: 82.4 fL (ref 78.0–100.0)
PLATELETS: 256 10*3/uL (ref 150–400)
RBC: 5.27 MIL/uL (ref 4.22–5.81)
RDW: 13.4 % (ref 11.5–15.5)
WBC: 9.4 10*3/uL (ref 4.0–10.5)

## 2014-03-11 LAB — GLUCOSE, CAPILLARY
GLUCOSE-CAPILLARY: 275 mg/dL — AB (ref 70–99)
GLUCOSE-CAPILLARY: 213 mg/dL — AB (ref 70–99)
GLUCOSE-CAPILLARY: 113 mg/dL — AB (ref 70–99)
Glucose-Capillary: 207 mg/dL — ABNORMAL HIGH (ref 70–99)
Glucose-Capillary: 131 mg/dL — ABNORMAL HIGH (ref 70–99)
Glucose-Capillary: 87 mg/dL (ref 70–99)

## 2014-03-11 LAB — BASIC METABOLIC PANEL
ANION GAP: 13 (ref 5–15)
BUN: 6 mg/dL (ref 6–23)
CALCIUM: 8.6 mg/dL (ref 8.4–10.5)
CO2: 27 mEq/L (ref 19–32)
Chloride: 98 mEq/L (ref 96–112)
Creatinine, Ser: 0.98 mg/dL (ref 0.50–1.35)
Glucose, Bld: 271 mg/dL — ABNORMAL HIGH (ref 70–99)
Potassium: 3.3 mEq/L — ABNORMAL LOW (ref 3.7–5.3)
Sodium: 138 mEq/L (ref 137–147)

## 2014-03-11 LAB — HEMOGLOBIN A1C
Hgb A1c MFr Bld: 11.3 % — ABNORMAL HIGH (ref <5.7)
Mean Plasma Glucose: 278 mg/dL — ABNORMAL HIGH (ref <117)

## 2014-03-11 MED ORDER — POLYETHYLENE GLYCOL 3350 17 G PO PACK: 17.0000 g | PACK | Freq: Every day | ORAL | Status: DC | PRN

## 2014-03-11 MED ORDER — HYDRALAZINE HCL 50 MG PO TABS
50.0000 mg | ORAL_TABLET | Freq: Three times a day (TID) | ORAL | Status: DC
  Administered 2014-03-11 – 2014-03-12 (×4): 50 mg via ORAL
  Filled 2014-03-11 (×7): qty 1

## 2014-03-11 MED ORDER — DEXTROSE 5 % IV SOLN
20.0000 mg | Freq: Four times a day (QID) | INTRAVENOUS | Status: DC
  Administered 2014-03-11 – 2014-03-12 (×5): 20 mg via INTRAVENOUS
  Filled 2014-03-11 (×8): qty 4

## 2014-03-11 MED ORDER — INSULIN DETEMIR 100 UNIT/ML ~~LOC~~ SOLN
45.0000 [IU] | Freq: Every day | SUBCUTANEOUS | Status: DC
  Administered 2014-03-11: 45 [IU] via SUBCUTANEOUS
  Filled 2014-03-11 (×2): qty 0.45

## 2014-03-11 MED ORDER — LUBIPROSTONE 24 MCG PO CAPS
24.0000 ug | ORAL_CAPSULE | Freq: Two times a day (BID) | ORAL | Status: DC
  Administered 2014-03-11 – 2014-03-12 (×3): 24 ug via ORAL
  Filled 2014-03-11 (×5): qty 1

## 2014-03-11 MED ORDER — BISACODYL 10 MG RE SUPP: 10.0000 mg | Freq: Every day | RECTAL | Status: DC | PRN

## 2014-03-11 MED ORDER — INSULIN ASPART 100 UNIT/ML ~~LOC~~ SOLN: 0.0000 [IU] | Freq: Every day | SUBCUTANEOUS | Status: DC

## 2014-03-11 MED ORDER — INSULIN ASPART 100 UNIT/ML ~~LOC~~ SOLN
0.0000 [IU] | Freq: Three times a day (TID) | SUBCUTANEOUS | Status: DC
  Administered 2014-03-11: 7 [IU] via SUBCUTANEOUS
  Administered 2014-03-11 – 2014-03-12 (×2): 3 [IU] via SUBCUTANEOUS

## 2014-03-11 MED ORDER — PREGABALIN 75 MG PO CAPS
150.0000 mg | ORAL_CAPSULE | Freq: Once | ORAL | Status: AC
  Administered 2014-03-11: 150 mg via ORAL
  Filled 2014-03-11: qty 2

## 2014-03-11 MED ORDER — PREGABALIN 75 MG PO CAPS
150.0000 mg | ORAL_CAPSULE | Freq: Three times a day (TID) | ORAL | Status: DC
  Administered 2014-03-11 – 2014-03-12 (×4): 150 mg via ORAL
  Filled 2014-03-11 (×4): qty 6

## 2014-03-11 MED ORDER — CLONIDINE HCL 0.1 MG PO TABS
0.1000 mg | ORAL_TABLET | Freq: Once | ORAL | Status: AC
  Administered 2014-03-11: 0.1 mg via ORAL
  Filled 2014-03-11: qty 1

## 2014-03-11 MED ORDER — INSULIN ASPART 100 UNIT/ML ~~LOC~~ SOLN: 0.0000 [IU] | Freq: Three times a day (TID) | SUBCUTANEOUS | Status: DC

## 2014-03-11 MED ORDER — AMLODIPINE BESYLATE 10 MG PO TABS
10.0000 mg | ORAL_TABLET | Freq: Every day | ORAL | Status: DC
Start: 2014-03-11 — End: 2014-03-12
  Administered 2014-03-11 – 2014-03-12 (×2): 10 mg via ORAL
  Filled 2014-03-11: qty 1
  Filled 2014-03-11 (×2): qty 2
  Filled 2014-03-11: qty 1

## 2014-03-11 MED ORDER — POTASSIUM CHLORIDE 10 MEQ/100ML IV SOLN
10.0000 meq | INTRAVENOUS | Status: AC
  Administered 2014-03-11 (×3): 10 meq via INTRAVENOUS
  Filled 2014-03-11: qty 100

## 2014-03-11 NOTE — Progress Notes (Signed)
PROGRESS NOTE  Kenneth Fox:[id number] DOB: [birth year] DOA: 03/09/2014 PCP: Pcp Not In System  Assessment/Plan: Nausea and vomiting most likely due to diabetic gastroparesis IV Zofran PRN  clears IV reglan  Diabetic gastroparesis  IV Reglan q 6 hrs   Abdominal pain  -CT scan abd/pelvis WNL  Hyperglycemia  Increase levemir SSI coverage PRN   Hypokalemia- due to N+V  Replete IV    Hypertension  Not on home medications -in June was taking norvasc/hydralazine   Obstructive sleep apnea  CPAP qhs  Monitor O2 Sats  H/o constipation During previous admission, has responded to [employer]  Code Status: full Family Communication: patient Disposition Plan: home when able to tolerate food   Consultants:    Procedures:      HPI/Subjective: Pain with eating last PM  Objective: Filed Vitals:   03/11/14 0538  BP: 148/77  Pulse: 96  Temp: 98 F (36.7 C)  Resp: 16    Intake/Output Summary (Last 24 hours) at 03/11/14 0802 Last data filed at 03/11/14 8119  Gross per 24 hour  Intake   3470 ml  Output      0 ml  Net   3470 ml   Filed Weights   03/09/14 1530 03/09/14 2130  Weight: 150.594 kg (332 lb) 150.594 kg (332 lb)    Exam:   General:  A+Ox3, NAD  Cardiovascular: rrr  Respiratory: clear  Abdomen: +BS, obese  Musculoskeletal: moves all 4 ext   Data Reviewed: Basic Metabolic Panel:  Recent Labs Lab 03/08/14 1444 03/08/14 1451 03/09/14 0325 03/09/14 1618 03/10/14 0544 03/11/14 0355  NA 143 139 137 140 142 138  K 3.7 3.4* 3.4* 3.2* 3.4* 3.3*  CL 102 100 98 99 104 98  CO2 26  --  27 30 27 27   GLUCOSE 296* 304* 352* 253* 170* 271*  BUN 6 4* 6 5* 5* 6  CREATININE 0.99 1.00 0.96 0.97 1.02 0.98  CALCIUM 9.0  --  8.7 8.7 8.4 8.6   Liver Function Tests:  Recent Labs Lab 03/08/14 1444 03/09/14 0325 03/09/14 1618  AST 16 11 13   ALT 13 8 12   ALKPHOS 100 92 93  BILITOT 0.3 0.3 0.3  PROT 7.4 7.0 7.0  ALBUMIN 3.0* 2.9* 2.9*     Recent Labs Lab 03/08/14 1444 03/09/14 1618  LIPASE 58 36   No results found for this basename: AMMONIA,  in the last 168 hours CBC:  Recent Labs Lab 03/08/14 1429 03/08/14 1451 03/09/14 0325 03/09/14 1618 03/10/14 0544 03/11/14 0355  WBC 8.8  --  8.3 7.5 7.2 9.4  NEUTROABS 6.0  --  6.7 5.5  --   --   HGB 14.8 16.3 14.0 13.7 13.5 14.5  HCT 43.5 48.0 41.5 41.3 41.0 43.4  MCV 81.9  --  82.3 82.4 83.2 82.4  PLT 263  --  259 252 233 256   Cardiac Enzymes:  Recent Labs Lab 03/08/14 1423  TROPONINI <0.30   BNP (last 3 results)  Recent Labs  11/24/13 2012  PROBNP 72.7   CBG:  Recent Labs Lab 03/10/14 1134 03/10/14 1554 03/10/14 1955 03/10/14 2353 03/11/14 0352  GLUCAP 241* 225* 284* 255* 275*    No results found for this or any previous visit (from the past 240 hour(s)).   Studies: Ct Abdomen Pelvis W Contrast  03/11/2014   CLINICAL DATA:  Generalized abdominal pain for 1 week. Severe nausea and vomiting.  EXAM: CT ABDOMEN AND PELVIS WITH CONTRAST  TECHNIQUE: Multidetector CT imaging  of the abdomen and pelvis was performed using the standard protocol following bolus administration of intravenous contrast.  CONTRAST:  100mL OMNIPAQUE IOHEXOL 300 MG/ML  SOLN  COMPARISON:  11/16/2013  FINDINGS: Atelectasis in the lung bases.  Diffuse fatty infiltration of the liver. The gallbladder, spleen, pancreas, adrenal glands, inferior vena cava, abdominal aorta, and retroperitoneal lymph nodes are unremarkable. Small parenchymal cyst in the right kidney. No hydronephrosis in either kidney. Stomach, small bowel, and colon appear unremarkable for degree of distention. Broad-based umbilical hernia containing fat. No free air or free fluid in the abdomen.  Pelvis: Appendix is normal. Prostate gland is not enlarged. Bladder wall is not thickened. No free or loculated pelvic fluid collections. No pelvic mass or lymphadenopathy. Postoperative changes in the right hemipelvis. Benign  appearing cyst in the left hemipelvis. No destructive bone lesions. Normal alignment of the lumbar spine.  IMPRESSION: No acute process demonstrated in the abdomen or pelvis. Mild diffuse fatty infiltration of the liver.   Electronically Signed   By: Burman NievesWilliam  Stevens M.D.   On: 03/11/2014 05:12   Dg Abd Acute W/chest  03/09/2014   CLINICAL DATA:  43 year old male with abdominal pain in all quadrants, nausea and vomiting. Initial encounter.  EXAM: ACUTE ABDOMEN SERIES (ABDOMEN 2 VIEW & CHEST 1 VIEW)  COMPARISON:  Abdominal series 11/24/2013 and earlier. Chest radiographs 03/08/2014 and earlier.  FINDINGS: Semi upright AP view of the chest at 1645 hrs. Large body habitus. Chronic infrahilar atelectasis. Stable cardiomegaly and mediastinal contours. No pneumothorax or pneumoperitoneum. Visualized tracheal air column is within normal limits.  Left-side-down lateral decubitus view is of the abdomen. No pneumoperitoneum. Non obstructed bowel gas pattern. Chronic ORIF changes along the right acetabulum and pelvis. No acute osseous abnormality identified.  IMPRESSION: 1.  Non obstructed bowel gas pattern, no free air. 2. Stable cardiomegaly and infrahilar atelectasis.   Electronically Signed   By: Augusto GambleLee  Hall M.D.   On: 03/09/2014 17:03    Scheduled Meds: . enoxaparin (LOVENOX) injection  75 mg Subcutaneous Q24H  . insulin aspart  0-20 Units Subcutaneous TID WC  . insulin aspart  0-5 Units Subcutaneous QHS  . insulin detemir  45 Units Subcutaneous Daily  . metoCLOPramide (REGLAN) injection  20 mg Intravenous 4 times per day  . pantoprazole (PROTONIX) IV  40 mg Intravenous Q24H  . potassium chloride  10 mEq Intravenous Q1 Hr x 3  . pregabalin  150 mg Oral TID  . trimethoprim-polymyxin b  1 drop Right Eye Q4H   Continuous Infusions: . sodium chloride 100 mL/hr at 03/11/14 0753   Antibiotics Given (last 72 hours)   None      Principal Problem:   Nausea and vomiting Active Problems:   Hyperglycemia    Hypertension   Obstructive sleep apnea   Abdominal pain   Diabetic gastroparesis   Hypokalemia    Time spent: 25 min    VANN, JESSICA  [facility] Pager [phone]. If 7PM-7AM, please contact night-coverage at [email or web address], password [email or web address] 03/11/2014, 8:02 AM  LOS: 2 days

## 2014-03-12 LAB — GLUCOSE, CAPILLARY: GLUCOSE-CAPILLARY: 123 mg/dL — AB (ref 70–99)

## 2014-03-12 MED ORDER — PANTOPRAZOLE SODIUM 40 MG PO TBEC
40.0000 mg | DELAYED_RELEASE_TABLET | Freq: Every day | ORAL | Status: DC
  Administered 2014-03-12: 40 mg via ORAL
  Filled 2014-03-12: qty 1

## 2014-03-12 MED ORDER — METOCLOPRAMIDE HCL 10 MG PO TABS: 20.0000 mg | ORAL_TABLET | Freq: Four times a day (QID) | ORAL | Status: DC

## 2014-03-12 MED ORDER — HYDRALAZINE HCL 50 MG PO TABS: 50.0000 mg | ORAL_TABLET | Freq: Three times a day (TID) | ORAL | Status: DC

## 2014-03-12 MED ORDER — METOCLOPRAMIDE HCL 10 MG PO TABS: 20.0000 mg | ORAL_TABLET | Freq: Three times a day (TID) | ORAL | Status: DC

## 2014-03-12 MED ORDER — AMLODIPINE BESYLATE 10 MG PO TABS: 10.0000 mg | ORAL_TABLET | Freq: Every day | ORAL | Status: DC

## 2014-03-12 NOTE — Progress Notes (Signed)
D/c to home with wife.  Via wheelchair. Pain denied breathing regular and unlabored.

## 2014-03-12 NOTE — Discharge Summary (Signed)
Physician Discharge Summary  Kenneth Fox NWG:[id number] DOB: [birth year] DOA: 03/09/2014  PCP: Pcp Not In System  Admit date: 03/09/2014 Discharge date: 03/12/2014  Time spent: 35 minutes  Recommendations for Outpatient Follow-up:  Needs better diabetic control- has appointment with Dr. Sharl Ma  Discharge Diagnoses:  Principal Problem:   Nausea and vomiting Active Problems:   Hyperglycemia   Hypertension   Obstructive sleep apnea   Abdominal pain   Diabetic gastroparesis   Hypokalemia   Discharge Condition: improved  Diet recommendation: diabetic  Filed Weights   03/09/14 1530 03/09/14 2130  Weight: 150.594 kg (332 lb) 150.594 kg (332 lb)    History of present illness:  Kenneth Fox is a 43 y.o. male with a history of DM2, HTN, hyperlipidemia, Diabetic Gastroparesis, Diabetic Neuropathy, and OSA who presents to the ED with complaints of increased nausea and vomiting x 1 week. He reports having sharp epigastric ABD pain. He has not been able to hold down foods , liquids or his medications. He denies any hematemesis, diarrhea, constipation, and fever. He has been seen in the ED 3 times for his symptoms. He has not had relief with his home medications.    Hospital Course:  Nausea and vomiting most likely due to diabetic gastroparesis  -now tolerating diet and wants to go home   Diabetic gastroparesis  Increase reglan  Abdominal pain  -CT scan abd/pelvis WNL   Hyperglycemia  Increase levemir  SSI coverage PRN   Hypokalemia- due to N+V  Replete IV   Hypertension  Not on home medications  -in June was taking norvasc/hydralazine   Obstructive sleep apnea  CPAP qhs    H/o constipation  During previous admission, has responded to amitza- may need outpatient   Procedures:    Consultations:    Discharge Exam: Filed Vitals:   03/12/14 0530  BP: 140/87  Pulse: 86  Temp: 98.2 F (36.8 C)  Resp: 20    General: A+Ox3, NAD Cardiovascular:  rrr Respiratory: clear  Discharge Instructions You were cared for by a hospitalist during your hospital stay. If you have any questions about your discharge medications or the care you received while you were in the hospital after you are discharged, you can call the unit and asked to speak with the hospitalist on call if the hospitalist that took care of you is not available. Once you are discharged, your primary care physician will handle any further medical issues. Please note that NO REFILLS for any discharge medications will be authorized once you are discharged, as it is imperative that you return to your primary care physician (or establish a relationship with a primary care physician if you do not have one) for your aftercare needs so that they can reassess your need for medications and monitor your lab values.  Discharge Instructions   Diet - low sodium heart healthy    Complete by:  As directed      Diet Carb Modified    Complete by:  As directed      Discharge instructions    Complete by:  As directed   Needs better blood sugar control- monitor blood sugars and bring to PCP     Increase activity slowly    Complete by:  As directed           Discharge Medication List as of 03/12/2014  9:55 AM    START taking these medications   Details  amLODipine (NORVASC) 10 MG tablet Take 1 tablet (10 mg total)  by mouth daily., Starting 03/12/2014, Until Discontinued, Print    hydrALAZINE (APRESOLINE) 50 MG tablet Take 1 tablet (50 mg total) by mouth every 8 (eight) hours., Starting 03/12/2014, Until Discontinued, Print      CONTINUE these medications which have CHANGED   Details  metoCLOPramide (REGLAN) 10 MG tablet Take 2 tablets (20 mg total) by mouth every 6 (six) hours., Starting 03/12/2014, Until Discontinued, Print      CONTINUE these medications which have NOT CHANGED   Details  insulin aspart (NOVOLOG) 100 UNIT/ML injection Inject 10-20 Units into the skin 3 (three) times daily  with meals. per sliding scale CBG 70 - 120: 0 units CBG 121 - 150: 2 units CBG 151 - 200: 3 units CBG 201 - 250: 5 units CBG 251 - 300: 8 units CBG 301 - 350: 11 units CBG 351 - 400: 15 units,  Starting 11/26/2013, Until Discontinued, Print    insulin detemir (LEVEMIR) 100 UNIT/ML injection Inject 0.65 mLs (65 Units total) into the skin every morning., Starting 12/05/2013, Until Discontinued, Normal    omeprazole (PRILOSEC) 20 MG capsule Take 1 capsule (20 mg total) by mouth 2 (two) times daily., Starting 12/01/2013, Until Discontinued, Print    ondansetron (ZOFRAN) 4 MG tablet Take 1 tablet (4 mg total) by mouth every 6 (six) hours., Starting 03/08/2014, Until Discontinued, Print    polyethylene glycol (MIRALAX) packet Take 17 g by mouth daily., Starting 11/18/2013, Until Discontinued, No Print    potassium chloride 20 MEQ TBCR Take 20 mEq by mouth daily., Starting 11/26/2013, Until Discontinued, Print    pregabalin (LYRICA) 150 MG capsule Take 150 mg by mouth 3 (three) times daily., Until Discontinued, Historical Med    promethazine (PHENERGAN) 25 MG tablet Take 25 mg by mouth every 8 (eight) hours as needed for nausea or vomiting. , Until Discontinued, Historical Med    trimethoprim-polymyxin b (POLYTRIM) ophthalmic solution Place 1 drop into the right eye every 4 (four) hours., Starting 02/26/2014, Until Discontinued, Print      STOP taking these medications     oxyCODONE (OXY IR/ROXICODONE) 5 MG immediate release tablet        Allergies  Allergen Reactions  . Lisinopril Other (See Comments)    Patient developed AKI after being on lisinopril for a week.      The results of significant diagnostics from this hospitalization (including imaging, microbiology, ancillary and laboratory) are listed below for reference.    Significant Diagnostic Studies: Dg Chest 2 View  03/08/2014   CLINICAL DATA:  Acute onset shortness of breath  EXAM: CHEST  2 VIEW  COMPARISON:  December 03, 2013   FINDINGS: There is mild scarring in the right base. There is no edema or consolidation. Heart is mildly enlarged with pulmonary vascularity within normal limits. No adenopathy. No bone lesions.  IMPRESSION: Mild cardiac enlargement. Mild scarring right base. No edema or consolidation.   Electronically Signed   By: Bretta BangWilliam  Woodruff M.D.   On: 03/08/2014 15:16   Ct Abdomen Pelvis W Contrast  03/11/2014   CLINICAL DATA:  Generalized abdominal pain for 1 week. Severe nausea and vomiting.  EXAM: CT ABDOMEN AND PELVIS WITH CONTRAST  TECHNIQUE: Multidetector CT imaging of the abdomen and pelvis was performed using the standard protocol following bolus administration of intravenous contrast.  CONTRAST:  100mL OMNIPAQUE IOHEXOL 300 MG/ML  SOLN  COMPARISON:  11/16/2013  FINDINGS: Atelectasis in the lung bases.  Diffuse fatty infiltration of the liver. The gallbladder, spleen, pancreas, adrenal glands,  inferior vena cava, abdominal aorta, and retroperitoneal lymph nodes are unremarkable. Small parenchymal cyst in the right kidney. No hydronephrosis in either kidney. Stomach, small bowel, and colon appear unremarkable for degree of distention. Broad-based umbilical hernia containing fat. No free air or free fluid in the abdomen.  Pelvis: Appendix is normal. Prostate gland is not enlarged. Bladder wall is not thickened. No free or loculated pelvic fluid collections. No pelvic mass or lymphadenopathy. Postoperative changes in the right hemipelvis. Benign appearing cyst in the left hemipelvis. No destructive bone lesions. Normal alignment of the lumbar spine.  IMPRESSION: No acute process demonstrated in the abdomen or pelvis. Mild diffuse fatty infiltration of the liver.   Electronically Signed   By: Burman Nieves M.D.   On: 03/11/2014 05:12   Dg Abd Acute W/chest  03/09/2014   CLINICAL DATA:  43 year old male with abdominal pain in all quadrants, nausea and vomiting. Initial encounter.  EXAM: ACUTE ABDOMEN SERIES  (ABDOMEN 2 VIEW & CHEST 1 VIEW)  COMPARISON:  Abdominal series 11/24/2013 and earlier. Chest radiographs 03/08/2014 and earlier.  FINDINGS: Semi upright AP view of the chest at 1645 hrs. Large body habitus. Chronic infrahilar atelectasis. Stable cardiomegaly and mediastinal contours. No pneumothorax or pneumoperitoneum. Visualized tracheal air column is within normal limits.  Left-side-down lateral decubitus view is of the abdomen. No pneumoperitoneum. Non obstructed bowel gas pattern. Chronic ORIF changes along the right acetabulum and pelvis. No acute osseous abnormality identified.  IMPRESSION: 1.  Non obstructed bowel gas pattern, no free air. 2. Stable cardiomegaly and infrahilar atelectasis.   Electronically Signed   By: Augusto Gamble M.D.   On: 03/09/2014 17:03    Microbiology: No results found for this or any previous visit (from the past 240 hour(s)).   Labs: Basic Metabolic Panel:  Recent Labs Lab 03/08/14 1444 03/08/14 1451 03/09/14 0325 03/09/14 1618 03/10/14 0544 03/11/14 0355  NA 143 139 137 140 142 138  K 3.7 3.4* 3.4* 3.2* 3.4* 3.3*  CL 102 100 98 99 104 98  CO2 26  --  27 30 27 27   GLUCOSE 296* 304* 352* 253* 170* 271*  BUN 6 4* 6 5* 5* 6  CREATININE 0.99 1.00 0.96 0.97 1.02 0.98  CALCIUM 9.0  --  8.7 8.7 8.4 8.6   Liver Function Tests:  Recent Labs Lab 03/08/14 1444 03/09/14 0325 03/09/14 1618  AST 16 11 13   ALT 13 8 12   ALKPHOS 100 92 93  BILITOT 0.3 0.3 0.3  PROT 7.4 7.0 7.0  ALBUMIN 3.0* 2.9* 2.9*    Recent Labs Lab 03/08/14 1444 03/09/14 1618  LIPASE 58 36   No results found for this basename: AMMONIA,  in the last 168 hours CBC:  Recent Labs Lab 03/08/14 1429 03/08/14 1451 03/09/14 0325 03/09/14 1618 03/10/14 0544 03/11/14 0355  WBC 8.8  --  8.3 7.5 7.2 9.4  NEUTROABS 6.0  --  6.7 5.5  --   --   HGB 14.8 16.3 14.0 13.7 13.5 14.5  HCT 43.5 48.0 41.5 41.3 41.0 43.4  MCV 81.9  --  82.3 82.4 83.2 82.4  PLT 263  --  259 252 233 256    Cardiac Enzymes:  Recent Labs Lab 03/08/14 1423  TROPONINI <0.30   BNP: BNP (last 3 results)  Recent Labs  11/24/13 2012  PROBNP 72.7   CBG:  Recent Labs Lab 03/11/14 0845 03/11/14 1144 03/11/14 1710 03/11/14 2110 03/12/14 0816  GLUCAP 207* 131* 87 113* 123*  SignedMarlin Canary  [facility] 03/12/2014, 1:35 PM

## 2014-03-13 ENCOUNTER — Emergency Department (HOSPITAL_COMMUNITY)
Admission: EM | Admit: 2014-03-13 | Discharge: 2014-03-13 | Disposition: A | Discharge status: Home / Self Care | Payer: Medicaid Other | Attending: Emergency Medicine | Admitting: Emergency Medicine

## 2014-03-13 ENCOUNTER — Encounter (HOSPITAL_COMMUNITY): Payer: Self-pay | Admitting: Emergency Medicine

## 2014-03-13 DIAGNOSIS — Z862 Personal history of diseases of the blood and blood-forming organs and certain disorders involving the immune mechanism: Secondary | ICD-10-CM | POA: Diagnosis not present

## 2014-03-13 DIAGNOSIS — M797 Fibromyalgia: Secondary | ICD-10-CM | POA: Insufficient documentation

## 2014-03-13 DIAGNOSIS — F419 Anxiety disorder, unspecified: Secondary | ICD-10-CM | POA: Insufficient documentation

## 2014-03-13 DIAGNOSIS — Z79899 Other long term (current) drug therapy: Secondary | ICD-10-CM | POA: Insufficient documentation

## 2014-03-13 DIAGNOSIS — R1013 Epigastric pain: Secondary | ICD-10-CM | POA: Diagnosis present

## 2014-03-13 DIAGNOSIS — J45909 Unspecified asthma, uncomplicated: Secondary | ICD-10-CM | POA: Insufficient documentation

## 2014-03-13 DIAGNOSIS — E669 Obesity, unspecified: Secondary | ICD-10-CM | POA: Diagnosis not present

## 2014-03-13 DIAGNOSIS — G629 Polyneuropathy, unspecified: Secondary | ICD-10-CM | POA: Diagnosis not present

## 2014-03-13 DIAGNOSIS — R112 Nausea with vomiting, unspecified: Secondary | ICD-10-CM

## 2014-03-13 DIAGNOSIS — G8929 Other chronic pain: Secondary | ICD-10-CM | POA: Diagnosis not present

## 2014-03-13 DIAGNOSIS — Z794 Long term (current) use of insulin: Secondary | ICD-10-CM | POA: Diagnosis not present

## 2014-03-13 DIAGNOSIS — K5900 Constipation, unspecified: Secondary | ICD-10-CM | POA: Insufficient documentation

## 2014-03-13 DIAGNOSIS — F329 Major depressive disorder, single episode, unspecified: Secondary | ICD-10-CM | POA: Diagnosis not present

## 2014-03-13 DIAGNOSIS — K3184 Gastroparesis: Secondary | ICD-10-CM | POA: Diagnosis not present

## 2014-03-13 DIAGNOSIS — E119 Type 2 diabetes mellitus without complications: Secondary | ICD-10-CM | POA: Diagnosis not present

## 2014-03-13 DIAGNOSIS — I10 Essential (primary) hypertension: Secondary | ICD-10-CM | POA: Diagnosis not present

## 2014-03-13 LAB — CBC WITH DIFFERENTIAL/PLATELET
BASOS ABS: 0 10*3/uL (ref 0.0–0.1)
BASOS PCT: 0 % (ref 0–1)
EOS ABS: 0.1 10*3/uL (ref 0.0–0.7)
Eosinophils Relative: 1 % (ref 0–5)
HCT: 42.8 % (ref 39.0–52.0)
Hemoglobin: 14.7 g/dL (ref 13.0–17.0)
Lymphocytes Relative: 14 % (ref 12–46)
Lymphs Abs: 1 10*3/uL (ref 0.7–4.0)
MCH: 28.5 pg (ref 26.0–34.0)
MCHC: 34.3 g/dL (ref 30.0–36.0)
MCV: 83.1 fL (ref 78.0–100.0)
Monocytes Absolute: 0.7 10*3/uL (ref 0.1–1.0)
Monocytes Relative: 10 % (ref 3–12)
NEUTROS ABS: 5.5 10*3/uL (ref 1.7–7.7)
Neutrophils Relative %: 75 % (ref 43–77)
PLATELETS: 230 10*3/uL (ref 150–400)
RBC: 5.15 MIL/uL (ref 4.22–5.81)
RDW: 13.6 % (ref 11.5–15.5)
WBC: 7.3 10*3/uL (ref 4.0–10.5)

## 2014-03-13 LAB — COMPREHENSIVE METABOLIC PANEL
ALBUMIN: 2.9 g/dL — AB (ref 3.5–5.2)
ALK PHOS: 87 U/L (ref 39–117)
ALT: 16 U/L (ref 0–53)
AST: 34 U/L (ref 0–37)
Anion gap: 14 (ref 5–15)
BILIRUBIN TOTAL: 0.2 mg/dL — AB (ref 0.3–1.2)
BUN: 7 mg/dL (ref 6–23)
CHLORIDE: 99 meq/L (ref 96–112)
CO2: 26 mEq/L (ref 19–32)
Calcium: 8.9 mg/dL (ref 8.4–10.5)
Creatinine, Ser: 0.85 mg/dL (ref 0.50–1.35)
GFR calc Af Amer: >90 mL/min (ref >90)
GFR calc non Af Amer: >90 mL/min (ref >90)
Glucose, Bld: 219 mg/dL — ABNORMAL HIGH (ref 70–99)
POTASSIUM: 4.1 meq/L (ref 3.7–5.3)
SODIUM: 139 meq/L (ref 137–147)
Total Protein: 7.4 g/dL (ref 6.0–8.3)

## 2014-03-13 LAB — I-STAT TROPONIN, ED: Troponin i, poc: 0.01 ng/mL (ref 0.00–0.08)

## 2014-03-13 LAB — URINALYSIS, ROUTINE W REFLEX MICROSCOPIC
Bilirubin Urine: NEGATIVE
Glucose, UA: 250 mg/dL — AB
KETONES UR: NEGATIVE mg/dL
LEUKOCYTES UA: NEGATIVE
Nitrite: NEGATIVE
PH: 7 (ref 5.0–8.0)
PROTEIN: 100 mg/dL — AB
Specific Gravity, Urine: 1.007 (ref 1.005–1.030)
Urobilinogen, UA: 0.2 mg/dL (ref 0.0–1.0)

## 2014-03-13 LAB — LIPASE, BLOOD: LIPASE: 38 U/L (ref 11–59)

## 2014-03-13 LAB — URINE MICROSCOPIC-ADD ON

## 2014-03-13 MED ORDER — MORPHINE SULFATE 4 MG/ML IJ SOLN
8.0000 mg | Freq: Once | INTRAMUSCULAR | Status: AC
  Administered 2014-03-13: 8 mg via INTRAVENOUS
  Filled 2014-03-13: qty 2

## 2014-03-13 MED ORDER — FENTANYL CITRATE 0.05 MG/ML IJ SOLN
50.0000 ug | Freq: Once | INTRAMUSCULAR | Status: AC
  Administered 2014-03-13: 50 ug via INTRAVENOUS
  Filled 2014-03-13: qty 2

## 2014-03-13 MED ORDER — DIPHENHYDRAMINE HCL 50 MG/ML IJ SOLN
25.0000 mg | Freq: Once | INTRAMUSCULAR | Status: AC
  Administered 2014-03-13: 25 mg via INTRAVENOUS
  Filled 2014-03-13: qty 1

## 2014-03-13 MED ORDER — SODIUM CHLORIDE 0.9 % IV BOLUS (SEPSIS)
2000.0000 mL | Freq: Once | INTRAVENOUS | Status: AC
  Administered 2014-03-13: 2000 mL via INTRAVENOUS

## 2014-03-13 MED ORDER — METOCLOPRAMIDE HCL 5 MG/ML IJ SOLN
10.0000 mg | Freq: Once | INTRAMUSCULAR | Status: AC
  Administered 2014-03-13: 10 mg via INTRAVENOUS
  Filled 2014-03-13: qty 2

## 2014-03-13 MED ORDER — LORAZEPAM 2 MG/ML IJ SOLN
1.0000 mg | Freq: Once | INTRAMUSCULAR | Status: AC
  Administered 2014-03-13: 1 mg via INTRAVENOUS
  Filled 2014-03-13: qty 1

## 2014-03-13 NOTE — ED Notes (Signed)
Pt c/o pain to abd with nausea and emesis since this am

## 2014-03-13 NOTE — Discharge Instructions (Signed)
Take your meds as prescribed.   Follow up with your doctor.   Stay hydrated.   Return to ER if you have vomiting, severe pain, dehydration.

## 2014-03-13 NOTE — ED Provider Notes (Signed)
CSN: [id number]     Arrival date & time 03/13/14  1847 History   First MD Initiated Contact with Patient 03/13/14 1854     Chief Complaint  Patient presents with  . Abdominal Pain     (Consider location/radiation/quality/duration/timing/severity/associated sxs/prior Treatment) The history is provided by the patient.  Kenneth Fox is a 43 y.o. male hx of HTN, gastroparesis, obesity here with nausea, emesis, ab pain. Has chronic ab pain along with gastroparesis. Was admitted and discharged yesterday. Today, has epigastric pain with several episodes of vomiting. Denies diarrhea or fevers. Has been taking his reglan, zofran, with minimal relief.    Past Medical History  Diagnosis Date  . Hypertension   . Neuropathy   . Anxiety   . Depression   . Sickle cell trait   . Gastroenteritis   . Obesity   . Constipation   . High cholesterol   . Asthma   . Chronic bronchitis     "get it q yr" (11/25/2013)  . Sleep apnea     "don't use CPAP" (11/25/2013)  . Type II diabetes mellitus   . GERD (gastroesophageal reflux disease)   . Stomach ulcer   . Fibromyalgia    Past Surgical History  Procedure Laterality Date  . Hip fracture surgery Right 1999    "put a plate in"   Family History  Problem Relation Age of Onset  . Diabetes Father   . Neuropathy Father   . Hypertension Father   . Sickle cell anemia Mother    History  Substance Use Topics  . Smoking status: Never Smoker   . Smokeless tobacco: Never Used  . Alcohol Use: No    Review of Systems  Gastrointestinal: Positive for abdominal pain.  All other systems reviewed and are negative.     Allergies  Lisinopril  Home Medications   Prior to Admission medications   Medication Sig Start Date End Date Taking? Authorizing Provider  amLODipine (NORVASC) 10 MG tablet Take 10 mg by mouth daily.   Yes Historical Provider, MD  hydrALAZINE (APRESOLINE) 50 MG tablet Take 50 mg by mouth 3 (three) times daily.   Yes Historical  Provider, MD  insulin aspart (NOVOLOG) 100 UNIT/ML injection Inject 10-20 Units into the skin 3 (three) times daily with meals. per sliding scale CBG 70 - 120: 0 units CBG 121 - 150: 2 units CBG 151 - 200: 3 units CBG 201 - 250: 5 units CBG 251 - 300: 8 units CBG 301 - 350: 11 units CBG 351 - 400: 15 units 11/26/13  Yes Shanker Levora DredgeM Ghimire, MD  insulin detemir (LEVEMIR) 100 UNIT/ML injection Inject 65 Units into the skin daily.   Yes Historical Provider, MD  ondansetron (ZOFRAN) 4 MG tablet Take 4 mg by mouth every 8 (eight) hours as needed for nausea or vomiting.   Yes Historical Provider, MD  polyethylene glycol (MIRALAX / GLYCOLAX) packet Take 17 g by mouth daily.   Yes Historical Provider, MD  potassium chloride SA (K-DUR,KLOR-CON) 20 MEQ tablet Take 20 mEq by mouth daily.   Yes Historical Provider, MD  pregabalin (LYRICA) 150 MG capsule Take 150 mg by mouth 3 (three) times daily.   Yes Historical Provider, MD  promethazine (PHENERGAN) 25 MG tablet Take 25 mg by mouth every 8 (eight) hours as needed for nausea or vomiting.    Yes Historical Provider, MD  trimethoprim-polymyxin b (POLYTRIM) ophthalmic solution Place 1 drop into the right eye every 4 (four) hours.   Yes  Historical Provider, MD   BP 180/98  Pulse 100  Temp(Src) 98.5 F (36.9 C) (Oral)  Resp 17  Ht 5\' 8"  (1.727 m)  Wt 315 lb 4.1 oz (143 kg)  BMI 47.95 kg/m2  SpO2 94% Physical Exam  Nursing note and vitals reviewed. Constitutional: He is oriented to person, place, and time.  Uncomfortable, vomiting   HENT:  Head: Normocephalic.  MM dry   Eyes: Conjunctivae are normal. Pupils are equal, round, and reactive to light.  Neck: Normal range of motion. Neck supple.  Cardiovascular: Normal rate, regular rhythm and normal heart sounds.   Pulmonary/Chest: Effort normal and breath sounds normal. No respiratory distress. He has no wheezes. He has no rales.  Abdominal: Soft.  + epigastric tenderness. No rebound    Musculoskeletal: Normal range of motion. He exhibits no edema and no tenderness.  Neurological: He is alert and oriented to person, place, and time.  Skin: Skin is warm and dry.  Psychiatric: He has a normal mood and affect. His behavior is normal. Judgment and thought content normal.    ED Course  Procedures (including critical care time) Labs Review Labs Reviewed  COMPREHENSIVE METABOLIC PANEL - Abnormal; Notable for the following:    Glucose, Bld 219 (*)    Albumin 2.9 (*)    Total Bilirubin 0.2 (*)    All other components within normal limits  CBC WITH DIFFERENTIAL  LIPASE, BLOOD  URINALYSIS, ROUTINE W REFLEX MICROSCOPIC  I-STAT TROPOININ, ED    Imaging Review No results found.   EKG Interpretation   Date/Time:  Tuesday March 13 2014 18:55:39 EDT Ventricular Rate:  86 PR Interval:  158 QRS Duration: 89 QT Interval:  456 QTC Calculation: 545 R Axis:   20 Text Interpretation:  Sinus rhythm Low voltage, precordial leads Consider  anterior infarct Prolonged QT interval prolonged QT similar to previous  Confirmed by Shakevia Sarris  MD, Sumiye Hirth ([id number]) on 03/13/2014 7:04:31 PM      MDM   Final diagnoses:  None    Kenneth Fox is a 43 y.o. male here with vomiting, epigastric pain. Likely gastroparesis. Has multiple ED visits and multiple normal CTs recently. Will check labs and treat symptomatically.   9:40 PM' Labs at baseline. Fell asleep with morphine. But when I woke him up he said that he is in pain. He is comfortably sleeping and pulse ox drops to 90% when he sleeps. Tolerated liquids. He is stable for d/c. I told him to take meds as prescribed.     Richardean Canal, MD 03/13/14 2142

## 2014-03-13 NOTE — ED Notes (Signed)
Pt given water 

## 2014-03-14 ENCOUNTER — Encounter (HOSPITAL_COMMUNITY): Payer: Self-pay | Admitting: Emergency Medicine

## 2014-03-14 ENCOUNTER — Emergency Department (HOSPITAL_COMMUNITY)
Admission: EM | Admit: 2014-03-14 | Discharge: 2014-03-14 | Disposition: A | Discharge status: Home / Self Care | Payer: Medicaid Other | Attending: Emergency Medicine | Admitting: Emergency Medicine

## 2014-03-14 DIAGNOSIS — F419 Anxiety disorder, unspecified: Secondary | ICD-10-CM | POA: Insufficient documentation

## 2014-03-14 DIAGNOSIS — Z79899 Other long term (current) drug therapy: Secondary | ICD-10-CM | POA: Diagnosis not present

## 2014-03-14 DIAGNOSIS — R112 Nausea with vomiting, unspecified: Secondary | ICD-10-CM | POA: Diagnosis not present

## 2014-03-14 DIAGNOSIS — J45909 Unspecified asthma, uncomplicated: Secondary | ICD-10-CM | POA: Diagnosis not present

## 2014-03-14 DIAGNOSIS — E119 Type 2 diabetes mellitus without complications: Secondary | ICD-10-CM | POA: Insufficient documentation

## 2014-03-14 DIAGNOSIS — R197 Diarrhea, unspecified: Secondary | ICD-10-CM | POA: Insufficient documentation

## 2014-03-14 DIAGNOSIS — F329 Major depressive disorder, single episode, unspecified: Secondary | ICD-10-CM | POA: Diagnosis not present

## 2014-03-14 DIAGNOSIS — Z862 Personal history of diseases of the blood and blood-forming organs and certain disorders involving the immune mechanism: Secondary | ICD-10-CM | POA: Insufficient documentation

## 2014-03-14 DIAGNOSIS — R109 Unspecified abdominal pain: Secondary | ICD-10-CM | POA: Diagnosis not present

## 2014-03-14 DIAGNOSIS — Z794 Long term (current) use of insulin: Secondary | ICD-10-CM | POA: Diagnosis not present

## 2014-03-14 DIAGNOSIS — Z8719 Personal history of other diseases of the digestive system: Secondary | ICD-10-CM | POA: Insufficient documentation

## 2014-03-14 DIAGNOSIS — I10 Essential (primary) hypertension: Secondary | ICD-10-CM | POA: Insufficient documentation

## 2014-03-14 LAB — CBC WITH DIFFERENTIAL/PLATELET
BASOS ABS: 0 10*3/uL (ref 0.0–0.1)
Basophils Relative: 0 % (ref 0–1)
EOS ABS: 0 10*3/uL (ref 0.0–0.7)
EOS PCT: 0 % (ref 0–5)
HEMATOCRIT: 44.4 % (ref 39.0–52.0)
HEMOGLOBIN: 14.7 g/dL (ref 13.0–17.0)
Lymphocytes Relative: 17 % (ref 12–46)
Lymphs Abs: 1.4 10*3/uL (ref 0.7–4.0)
MCH: 27.4 pg (ref 26.0–34.0)
MCHC: 33.1 g/dL (ref 30.0–36.0)
MCV: 82.8 fL (ref 78.0–100.0)
MONO ABS: 0.7 10*3/uL (ref 0.1–1.0)
MONOS PCT: 8 % (ref 3–12)
NEUTROS ABS: 6.3 10*3/uL (ref 1.7–7.7)
Neutrophils Relative %: 75 % (ref 43–77)
Platelets: 249 10*3/uL (ref 150–400)
RBC: 5.36 MIL/uL (ref 4.22–5.81)
RDW: 13.6 % (ref 11.5–15.5)
WBC: 8.5 10*3/uL (ref 4.0–10.5)

## 2014-03-14 LAB — COMPREHENSIVE METABOLIC PANEL
ALT: 15 U/L (ref 0–53)
ANION GAP: 14 (ref 5–15)
AST: 20 U/L (ref 0–37)
Albumin: 3.2 g/dL — ABNORMAL LOW (ref 3.5–5.2)
Alkaline Phosphatase: 99 U/L (ref 39–117)
BILIRUBIN TOTAL: 0.4 mg/dL (ref 0.3–1.2)
BUN: 6 mg/dL (ref 6–23)
CALCIUM: 9.2 mg/dL (ref 8.4–10.5)
CO2: 28 mEq/L (ref 19–32)
Chloride: 99 mEq/L (ref 96–112)
Creatinine, Ser: 1 mg/dL (ref 0.50–1.35)
GFR calc Af Amer: >90 mL/min (ref >90)
GFR calc non Af Amer: 89 mL/min — ABNORMAL LOW (ref >90)
Glucose, Bld: 235 mg/dL — ABNORMAL HIGH (ref 70–99)
Potassium: 3.2 mEq/L — ABNORMAL LOW (ref 3.7–5.3)
Sodium: 141 mEq/L (ref 137–147)
TOTAL PROTEIN: 7.7 g/dL (ref 6.0–8.3)

## 2014-03-14 LAB — CBG MONITORING, ED: Glucose-Capillary: 214 mg/dL — ABNORMAL HIGH (ref 70–99)

## 2014-03-14 LAB — LIPASE, BLOOD: LIPASE: 27 U/L (ref 11–59)

## 2014-03-14 MED ORDER — POTASSIUM CHLORIDE CRYS ER 20 MEQ PO TBCR
40.0000 meq | EXTENDED_RELEASE_TABLET | Freq: Once | ORAL | Status: AC
  Administered 2014-03-14: 40 meq via ORAL
  Filled 2014-03-14: qty 2

## 2014-03-14 MED ORDER — MORPHINE SULFATE 4 MG/ML IJ SOLN
4.0000 mg | Freq: Once | INTRAMUSCULAR | Status: AC
  Administered 2014-03-14: 4 mg via INTRAVENOUS
  Filled 2014-03-14: qty 1

## 2014-03-14 NOTE — Discharge Instructions (Signed)

## 2014-03-14 NOTE — ED Provider Notes (Signed)
CSN: [id number]     Arrival date & time 03/14/14  1749 History   First MD Initiated Contact with Patient 03/14/14 2109     Chief Complaint  Patient presents with  . Abdominal Pain     (Consider location/radiation/quality/duration/timing/severity/associated sxs/prior Treatment) HPI Comments: The patient is a 43 year old male with history of chronic abdominal pain, hypertension, anxiety, depression, hypercholesterolemia, asthma, sleep apnea, diabetes, GERD, fibromyalgia who presents to the emergency department for evaluation of abdominal pain. Reports that his abdominal pain been ongoing for one year. He has burning epigastric pain with associated vomiting. He had one episode of diarrhea today which he attributes to taking MiraLax. Generally he struggled with constipation. He took a hydrocodone prior to arrival which improved his pain. He states "I've been waiting so long I feel better. My pain is mild now, it is an 8/10.". This feels like his normal chronic abdominal pain. He has been unable to followup with his primary care physician or gastroenterologist. No fevers, chills, chest pain, shortness of breath.  The history is provided by the patient. No language interpreter was used.    Past Medical History  Diagnosis Date  . Hypertension   . Neuropathy   . Anxiety   . Depression   . Sickle cell trait   . Gastroenteritis   . Obesity   . Constipation   . High cholesterol   . Asthma   . Chronic bronchitis     "get it q yr" (11/25/2013)  . Sleep apnea     "don't use CPAP" (11/25/2013)  . Type II diabetes mellitus   . GERD (gastroesophageal reflux disease)   . Stomach ulcer   . Fibromyalgia    Past Surgical History  Procedure Laterality Date  . Hip fracture surgery Right 1999    "put a plate in"   Family History  Problem Relation Age of Onset  . Diabetes Father   . Neuropathy Father   . Hypertension Father   . Sickle cell anemia Mother    History  Substance Use Topics  .  Smoking status: Never Smoker   . Smokeless tobacco: Never Used  . Alcohol Use: No    Review of Systems  Constitutional: Negative for fever and chills.  Respiratory: Negative for shortness of breath.   Cardiovascular: Negative for chest pain.  Gastrointestinal: Positive for nausea, vomiting, abdominal pain and diarrhea.  All other systems reviewed and are negative.     Allergies  Lisinopril  Home Medications   Prior to Admission medications   Medication Sig Start Date End Date Taking? Authorizing Provider  amLODipine (NORVASC) 10 MG tablet Take 10 mg by mouth daily.   Yes Historical Provider, MD  hydrALAZINE (APRESOLINE) 50 MG tablet Take 50 mg by mouth 3 (three) times daily.   Yes Historical Provider, MD  insulin aspart (NOVOLOG) 100 UNIT/ML injection Inject 10-20 Units into the skin 3 (three) times daily with meals. per sliding scale CBG 70 - 120: 0 units CBG 121 - 150: 2 units CBG 151 - 200: 3 units CBG 201 - 250: 5 units CBG 251 - 300: 8 units CBG 301 - 350: 11 units CBG 351 - 400: 15 units 11/26/13  Yes Shanker Levora DredgeM Ghimire, MD  insulin detemir (LEVEMIR) 100 UNIT/ML injection Inject 65 Units into the skin daily.   Yes Historical Provider, MD  ondansetron (ZOFRAN) 4 MG tablet Take 4 mg by mouth every 8 (eight) hours as needed for nausea or vomiting.   Yes Historical Provider, MD  polyethylene glycol (MIRALAX / GLYCOLAX) packet Take 17 g by mouth daily.   Yes Historical Provider, MD  potassium chloride SA (K-DUR,KLOR-CON) 20 MEQ tablet Take 20 mEq by mouth daily.   Yes Historical Provider, MD  pregabalin (LYRICA) 150 MG capsule Take 150 mg by mouth 3 (three) times daily.   Yes Historical Provider, MD  trimethoprim-polymyxin b (POLYTRIM) ophthalmic solution Place 1 drop into the right eye every 4 (four) hours.   Yes Historical Provider, MD   BP 184/102  Pulse 79  Temp(Src) 97.9 F (36.6 C) (Oral)  Resp 18  SpO2 94% Physical Exam  Nursing note and vitals  reviewed. Constitutional: He is oriented to person, place, and time. He appears well-developed and well-nourished. No distress.  Morbidly obese  HENT:  Head: Normocephalic and atraumatic.  Right Ear: External ear normal.  Left Ear: External ear normal.  Nose: Nose normal.  Eyes: Conjunctivae are normal.  Neck: Normal range of motion. No tracheal deviation present.  Cardiovascular: Normal rate, regular rhythm and normal heart sounds.   Pulmonary/Chest: Effort normal and breath sounds normal. No stridor.  Abdominal: Soft. Bowel sounds are normal. He exhibits no distension. There is no tenderness. There is no rigidity and no guarding.  No tenderness to deep palpation  Musculoskeletal: Normal range of motion.  Neurological: He is alert and oriented to person, place, and time.  Skin: Skin is warm and dry. He is not diaphoretic.  Psychiatric: He has a normal mood and affect. His behavior is normal.    ED Course  Procedures (including critical care time) Labs Review Labs Reviewed  COMPREHENSIVE METABOLIC PANEL - Abnormal; Notable for the following:    Potassium 3.2 (*)    Glucose, Bld 235 (*)    Albumin 3.2 (*)    GFR calc non Af Amer 89 (*)    All other components within normal limits  CBG MONITORING, ED - Abnormal; Notable for the following:    Glucose-Capillary 214 (*)    All other components within normal limits  CBC WITH DIFFERENTIAL  LIPASE, BLOOD    Imaging Review No results found.   EKG Interpretation None      MDM   Final diagnoses:  Abdominal pain, unspecified abdominal location   Patient presents emergency department for one year of abdominal pain. This is the same pain as he generally comes in with. He has not followed up with PCP or gastroenterologist. Patient feels improved after morphine and Zofran. Labs are remarkable only for mild hypokalemia. Potassium was repleted orally. He was given referral to gastroenterology. Discussed reasons to return to emergency  department. Vital signs stable for discharge. Patient / Family / Caregiver informed of clinical course, understand medical decision-making process, and agree with plan.     Mora Bellman, PA-C 03/16/14 [phone]

## 2014-03-14 NOTE — ED Notes (Signed)
Per EMS: Pt from home.  Pt c/o 10/10 abd pain x 1 year.  Pt was seen at [facility] on 10/1 and 10/6 for same.  Pt states he was told to follow up with PCP but "hasn't had time because he has to keep coming back to the emergency room".  Pt states he took hydrocodone before he came but that we "would give him dilaudid when he got here".  Pt was sleeping en route to hospital.  When he was told that he was going to be going to triage, pt started moaning and groaning.

## 2014-03-15 ENCOUNTER — Ambulatory Visit: Payer: Medicaid Other

## 2014-03-15 ENCOUNTER — Telehealth: Payer: Self-pay | Admitting: Internal Medicine

## 2014-03-15 NOTE — Telephone Encounter (Signed)
LMTCB-will need to speak with Lunetta Marina ONLY!!!

## 2014-03-15 NOTE — Telephone Encounter (Signed)
I spoke with pt's son.  Advised of an appointment scheduled with Dr. Craige CottaSood tomorrow, 03/16/14 at 3:00 PM for a sleep consult [facility], will [facility] visits, NPI# [id number], ph# [phone].  Pt's son verbalized understanding & will have pt &/or pt's spouse call us either this evening or at 8:00 AM tomorrow to confirm this appt.  Antionette FairyHolly D Pryor

## 2014-03-15 NOTE — ED Provider Notes (Signed)
ECG interpretation   Date: 03/15/2014  Rate: 83  Rhythm: normal sinus rhythm  QRS Axis: normal  Intervals: normal  ST/T Wave abnormalities: normal  Conduction Disutrbances: none  Narrative Interpretation:   Old EKG Reviewed: No significant changes noted     Lyanne CoKevin M Renise Gillies, MD 03/15/14 0127

## 2014-03-16 ENCOUNTER — Institutional Professional Consult (permissible substitution): Payer: Medicaid Other | Admitting: Pulmonary Disease

## 2014-03-17 NOTE — ED Provider Notes (Signed)
Medical screening examination/treatment/procedure(s) were performed by non-physician practitioner and as supervising physician I was immediately available for consultation/collaboration.   EKG Interpretation None        Lyanne CoKevin M Avagrace Botelho, MD 03/17/14 [phone]

## 2014-05-01 ENCOUNTER — Encounter: Payer: Self-pay | Admitting: Internal Medicine

## 2014-05-01 ENCOUNTER — Ambulatory Visit (INDEPENDENT_AMBULATORY_CARE_PROVIDER_SITE_OTHER): Payer: Medicaid Other | Admitting: Internal Medicine

## 2014-05-01 ENCOUNTER — Institutional Professional Consult (permissible substitution): Payer: Medicaid Other | Admitting: Pulmonary Disease

## 2014-05-01 VITALS — BP 138/74 | HR 90 | Ht 71.0 in | Wt 322.2 lb

## 2014-05-01 DIAGNOSIS — E669 Obesity, unspecified: Secondary | ICD-10-CM

## 2014-05-01 DIAGNOSIS — G4733 Obstructive sleep apnea (adult) (pediatric): Secondary | ICD-10-CM
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
## 2014-05-01 NOTE — Patient Instructions (Addendum)
Order- DME- [facility] Change CPAP to AutoPAP 10-20 cwp, mask of choice, humidifier, supplies    Dx OSA  We will get result of ONOX done by [facility]  Booklet on OSA

## 2014-05-03 NOTE — Assessment & Plan Note (Signed)
Not sure if his awakening panicked at night is due to oxygen levels, CPAP pressure, blood sugar ore something else. Plan- Change pressure mode to auto 10-20. Booklet on OSA. Consider ONOX on CPAP

## 2014-05-03 NOTE — Assessment & Plan Note (Signed)
Emphasized that weight loss would help both OSA and DM

## 2014-07-02 ENCOUNTER — Ambulatory Visit: Payer: Medicaid Other | Admitting: Internal Medicine

## 2015-01-17 ENCOUNTER — Emergency Department (HOSPITAL_COMMUNITY)
Admission: EM | Admit: 2015-01-17 | Discharge: 2015-01-18 | Disposition: A | Discharge status: Home / Self Care | Payer: Medicaid Other | Attending: Emergency Medicine | Admitting: Emergency Medicine

## 2015-01-17 ENCOUNTER — Emergency Department (HOSPITAL_COMMUNITY): Payer: Medicaid Other

## 2015-01-17 DIAGNOSIS — R739 Hyperglycemia, unspecified: Secondary | ICD-10-CM

## 2015-01-17 DIAGNOSIS — Z794 Long term (current) use of insulin: Secondary | ICD-10-CM | POA: Diagnosis not present

## 2015-01-17 DIAGNOSIS — I10 Essential (primary) hypertension: Secondary | ICD-10-CM | POA: Insufficient documentation

## 2015-01-17 DIAGNOSIS — Z862 Personal history of diseases of the blood and blood-forming organs and certain disorders involving the immune mechanism: Secondary | ICD-10-CM | POA: Insufficient documentation

## 2015-01-17 DIAGNOSIS — Z79899 Other long term (current) drug therapy: Secondary | ICD-10-CM | POA: Insufficient documentation

## 2015-01-17 DIAGNOSIS — J45909 Unspecified asthma, uncomplicated: Secondary | ICD-10-CM | POA: Insufficient documentation

## 2015-01-17 DIAGNOSIS — G629 Polyneuropathy, unspecified: Secondary | ICD-10-CM | POA: Insufficient documentation

## 2015-01-17 DIAGNOSIS — Z8659 Personal history of other mental and behavioral disorders: Secondary | ICD-10-CM | POA: Insufficient documentation

## 2015-01-17 DIAGNOSIS — M549 Dorsalgia, unspecified: Secondary | ICD-10-CM | POA: Diagnosis not present

## 2015-01-17 DIAGNOSIS — K5900 Constipation, unspecified: Secondary | ICD-10-CM | POA: Insufficient documentation

## 2015-01-17 DIAGNOSIS — E669 Obesity, unspecified: Secondary | ICD-10-CM | POA: Insufficient documentation

## 2015-01-17 DIAGNOSIS — E1165 Type 2 diabetes mellitus with hyperglycemia: Secondary | ICD-10-CM | POA: Insufficient documentation

## 2015-01-17 DIAGNOSIS — R112 Nausea with vomiting, unspecified: Secondary | ICD-10-CM | POA: Diagnosis not present

## 2015-01-17 LAB — URINALYSIS, ROUTINE W REFLEX MICROSCOPIC
Bilirubin Urine: NEGATIVE
Glucose, UA: >1000 mg/dL — AB
KETONES UR: NEGATIVE mg/dL
Leukocytes, UA: NEGATIVE
Nitrite: NEGATIVE
Specific Gravity, Urine: 1.024 (ref 1.005–1.030)
UROBILINOGEN UA: 1 mg/dL (ref 0.0–1.0)
pH: 6 (ref 5.0–8.0)

## 2015-01-17 LAB — CBG MONITORING, ED: Glucose-Capillary: 299 mg/dL — ABNORMAL HIGH (ref 65–99)

## 2015-01-17 LAB — URINE MICROSCOPIC-ADD ON

## 2015-01-17 MED ORDER — SODIUM CHLORIDE 0.9 % IV BOLUS (SEPSIS)
1000.0000 mL | Freq: Once | INTRAVENOUS | Status: AC
  Administered 2015-01-17: 1000 mL via INTRAVENOUS

## 2015-01-17 NOTE — ED Notes (Signed)
Asked pt to urinate for urinalysis; pt is unable to at this time.

## 2015-01-17 NOTE — ED Notes (Signed)
Patient transported to xray via stretcher.

## 2015-01-17 NOTE — ED Notes (Signed)
Patient to ED via EMS with C/O high blood sugar and his kidneys hurting for 3 days. States that he has not had a bowel movement in 3 days.  States that his stomach is tight and he feels bloated.  States that he has been urinating a lot.  C/O being constipated.  Abdomen is non tender to palpation.  States that it feels like his throat is swollen.

## 2015-01-17 NOTE — ED Provider Notes (Signed)
CSN: [id number]     Arrival date & time 01/17/15  2057 History   First MD Initiated Contact with Patient 01/17/15 2129     Chief Complaint  Patient presents with  . Hyperglycemia     (Consider location/radiation/quality/duration/timing/severity/associated sxs/prior Treatment) Patient is a 44 y.o. male presenting with hyperglycemia. The history is provided by the patient.  Hyperglycemia Blood sugar level PTA:  305 Severity:  Moderate Onset quality:  Unable to specify Duration:  3 days Timing:  Constant Progression:  Unchanged Chronicity:  Recurrent Diabetes status:  Controlled with insulin Current diabetic therapy:  Levemir, Novolog Context: not change in medication, not insulin pump use, not new diabetes diagnosis, not noncompliance, not recent change in diet and not recent illness   Relieved by:  None tried Associated symptoms: nausea ( clear fluid) and vomiting   Associated symptoms: no chest pain, no dehydration, no diaphoresis, no dizziness, no dysuria, no fever, no increased appetite, no increased thirst, no malaise and no shortness of breath   Nausea:    Severity:  Mild   Onset quality:  Gradual   Duration:  3 days   Timing:  Intermittent   Progression:  Unchanged Vomiting:    Emesis appearance: clear liquid.   Severity:  Moderate   Timing:  Intermittent   Progression:  Unchanged Risk factors: no hx of DKA     Past Medical History  Diagnosis Date  . Hypertension   . Neuropathy   . Anxiety   . Depression   . Sickle cell trait   . Gastroenteritis   . Obesity   . Constipation   . High cholesterol   . Asthma   . Chronic bronchitis     "get it q yr" (11/25/2013)  . Sleep apnea     "don't use CPAP" (11/25/2013)  . Type II diabetes mellitus   . GERD (gastroesophageal reflux disease)   . Stomach ulcer   . Fibromyalgia    Past Surgical History  Procedure Laterality Date  . Hip fracture surgery Right 1999    "put a plate in"   Family History  Problem  Relation Age of Onset  . Diabetes Father   . Neuropathy Father   . Hypertension Father   . Sickle cell anemia Mother   . Kidney disease Brother   . Kidney disease Sister   . Aneurysm Father   . Heart attack Mother    Social History  Substance Use Topics  . Smoking status: Never Smoker   . Smokeless tobacco: Never Used  . Alcohol Use: No    Review of Systems  Constitutional: Negative for fever and diaphoresis.  Respiratory: Negative for shortness of breath.   Cardiovascular: Negative for chest pain.  Gastrointestinal: Positive for nausea ( clear fluid) and vomiting.  Endocrine: Negative for polydipsia.  Genitourinary: Negative for dysuria, frequency and hematuria.  Musculoskeletal: Positive for back pain.  Skin: Negative for rash and wound.  Neurological: Negative for dizziness.  All other systems reviewed and are negative.     Allergies  Lisinopril  Home Medications   Prior to Admission medications   Medication Sig Start Date End Date Taking? Authorizing Provider  hydrALAZINE (APRESOLINE) 50 MG tablet Take 50 mg by mouth 3 (three) times daily.   Yes Historical Provider, MD  insulin aspart (NOVOLOG) 100 UNIT/ML injection Inject 10-20 Units into the skin 3 (three) times daily with meals. per sliding scale CBG 70 - 120: 0 units CBG 121 - 150: 2 units CBG 151 - 200:  3 units CBG 201 - 250: 5 units CBG 251 - 300: 8 units CBG 301 - 350: 11 units CBG 351 - 400: 15 units 11/26/13  Yes Shanker Levora Dredge, MD  insulin detemir (LEVEMIR) 100 UNIT/ML injection Inject 80 Units into the skin daily.    Yes Historical Provider, MD  amLODipine (NORVASC) 10 MG tablet Take 10 mg by mouth daily.    Historical Provider, MD  JANUMET XR [phone] MG TB24 Take 1 tablet by mouth daily. 02/15/14   Historical Provider, MD  losartan (COZAAR) 50 MG tablet Take 1 tablet by mouth daily. 02/15/14   Historical Provider, MD  ondansetron (ZOFRAN) 4 MG tablet Take 1 tablet (4 mg total) by mouth every 6  (six) hours. 01/18/15   Earley Favor, NP  ondansetron (ZOFRAN) 4 MG tablet Take 1 tablet (4 mg total) by mouth every 8 (eight) hours as needed for nausea or vomiting. 01/18/15   Earley Favor, NP  Oxycodone HCl 10 MG TABS Take 1 tablet by mouth every 6 (six) hours as needed. 04/17/14   Historical Provider, MD  polyethylene glycol (MIRALAX / GLYCOLAX) packet Take 17 g by mouth daily.    Historical Provider, MD  potassium chloride SA (K-DUR,KLOR-CON) 20 MEQ tablet Take 20 mEq by mouth daily.    Historical Provider, MD  pregabalin (LYRICA) 150 MG capsule Take 150 mg by mouth 3 (three) times daily.    Historical Provider, MD   BP 173/84 mmHg  Pulse 73  Temp(Src) 97.7 F (36.5 C) (Oral)  Resp 18  SpO2 96% Physical Exam  Constitutional: He is oriented to person, place, and time. He appears well-developed and well-nourished.  HENT:  Head: Normocephalic.  Eyes: Pupils are equal, round, and reactive to light.  Neck: Normal range of motion.  Cardiovascular: Normal rate and regular rhythm.   Pulmonary/Chest: Effort normal and breath sounds normal.  Abdominal: Soft. Bowel sounds are normal. He exhibits no distension. There is no tenderness.  Musculoskeletal: Normal range of motion.  Neurological: He is alert and oriented to person, place, and time.  Skin: Skin is warm and dry.  Vitals reviewed.   ED Course  Procedures (including critical care time) Labs Review Labs Reviewed  URINALYSIS, ROUTINE W REFLEX MICROSCOPIC (NOT AT [facility]) - Abnormal; Notable for the following:    Glucose, UA >1000 (*)    Hgb urine dipstick TRACE (*)    Protein, ur >300 (*)    All other components within normal limits  CBG MONITORING, ED - Abnormal; Notable for the following:    Glucose-Capillary 299 (*)    All other components within normal limits  I-STAT CHEM 8, ED - Abnormal; Notable for the following:    Potassium 2.8 (*)    Chloride 100 (*)    Glucose, Bld 194 (*)    All other components within normal limits   CBG MONITORING, ED - Abnormal; Notable for the following:    Glucose-Capillary 159 (*)    All other components within normal limits  URINE MICROSCOPIC-ADD ON  CBC WITH DIFFERENTIAL/PLATELET    Imaging Review Dg Abd Acute W/chest  01/18/2015   CLINICAL DATA:  Constipation and abdominal pain for 3 days  EXAM: DG ABDOMEN ACUTE W/ 1V CHEST  COMPARISON:  Chest radiograph March 08, 2014; CT abdomen and pelvis March 10, 2014  FINDINGS: PA chest: No edema or consolidation. Heart size and pulmonary vascularity are normal. No adenopathy.  Supine and upright abdomen: There is moderate stool in the colon. There is no  bowel dilatation or air-fluid level suggesting obstruction. No free air. Postoperative changes noted in the right periacetabular region. There are phleboliths in the right pelvis.  IMPRESSION: Bowel gas pattern unremarkable. Moderate stool throughout colon. No lung edema or consolidation.   Electronically Signed   By: Bretta Bang III M.D.   On: 01/18/2015 00:23   I, Edem Tiegs K, personally reviewed and evaluated these images and lab results as part of my medical decision-making.   EKG Interpretation None      MDM   Final diagnoses:  Non-intractable vomiting with nausea, vomiting of unspecified type  Hyperglycemia         Earley Favor, NP 01/18/15 0130  Lyndal Pulley, MD 01/21/15 1314

## 2015-01-17 NOTE — ED Notes (Signed)
CBG 299 

## 2015-01-18 LAB — CBC WITH DIFFERENTIAL/PLATELET
Basophils Absolute: 0 10*3/uL (ref 0.0–0.1)
Basophils Relative: 0 % (ref 0–1)
EOS PCT: 2 % (ref 0–5)
Eosinophils Absolute: 0.2 10*3/uL (ref 0.0–0.7)
HEMATOCRIT: 43.2 % (ref 39.0–52.0)
Hemoglobin: 14.2 g/dL (ref 13.0–17.0)
LYMPHS ABS: 2.4 10*3/uL (ref 0.7–4.0)
Lymphocytes Relative: 28 % (ref 12–46)
MCH: 27.5 pg (ref 26.0–34.0)
MCHC: 32.9 g/dL (ref 30.0–36.0)
MCV: 83.6 fL (ref 78.0–100.0)
Monocytes Absolute: 0.8 10*3/uL (ref 0.1–1.0)
Monocytes Relative: 9 % (ref 3–12)
NEUTROS ABS: 5.2 10*3/uL (ref 1.7–7.7)
Neutrophils Relative %: 61 % (ref 43–77)
PLATELETS: 210 10*3/uL (ref 150–400)
RBC: 5.17 MIL/uL (ref 4.22–5.81)
RDW: 15.3 % (ref 11.5–15.5)
WBC: 8.5 10*3/uL (ref 4.0–10.5)

## 2015-01-18 LAB — I-STAT CHEM 8, ED
BUN: 8 mg/dL (ref 6–20)
CHLORIDE: 100 mmol/L — AB (ref 101–111)
Calcium, Ion: 1.13 mmol/L (ref 1.12–1.23)
Creatinine, Ser: 1.1 mg/dL (ref 0.61–1.24)
Glucose, Bld: 194 mg/dL — ABNORMAL HIGH (ref 65–99)
HEMATOCRIT: 45 % (ref 39.0–52.0)
Hemoglobin: 15.3 g/dL (ref 13.0–17.0)
POTASSIUM: 2.8 mmol/L — AB (ref 3.5–5.1)
Sodium: 143 mmol/L (ref 135–145)
TCO2: 29 mmol/L (ref 0–100)

## 2015-01-18 LAB — CBG MONITORING, ED: Glucose-Capillary: 159 mg/dL — ABNORMAL HIGH (ref 65–99)

## 2015-01-18 MED ORDER — ONDANSETRON HCL 4 MG PO TABS: 4.0000 mg | ORAL_TABLET | Freq: Four times a day (QID) | ORAL | Status: DC

## 2015-01-18 MED ORDER — ONDANSETRON HCL 4 MG PO TABS: 4.0000 mg | ORAL_TABLET | Freq: Three times a day (TID) | ORAL | Status: DC | PRN

## 2015-01-18 NOTE — Discharge Instructions (Signed)
Blood Glucose Monitoring °Monitoring your blood glucose (also know as blood sugar) helps you to manage your diabetes. It also helps you and your health care provider monitor your diabetes and determine how well your treatment plan is working. °WHY SHOULD YOU MONITOR YOUR BLOOD GLUCOSE? °· It can help you understand how food, exercise, and medicine affect your blood glucose. °· It allows you to know what your blood glucose is at any given moment. You can quickly tell if you are having low blood glucose (hypoglycemia) or high blood glucose (hyperglycemia). °· It can help you and your health care provider know how to adjust your medicines. °· It can help you understand how to manage an illness or adjust medicine for exercise. °WHEN SHOULD YOU TEST? °Your health care provider will help you decide how often you should check your blood glucose. This may depend on the type of diabetes you have, your diabetes control, or the types of medicines you are taking. Be sure to write down all of your blood glucose readings so that this information can be reviewed with your health care provider. See below for examples of testing times that your health care provider may suggest. °Type 1 Diabetes °· Test 4 times a day if you are in good control, using an insulin pump, or perform multiple daily injections. °· If your diabetes is not well controlled or if you are sick, you may need to monitor more often. °· It is a good idea to also monitor: °¨ Before and after exercise. °¨ Between meals and 2 hours after a meal. °¨ Occasionally between 2:00 a.m. and 3:00 a.m. °Type 2 Diabetes °· It can vary with each person, but generally, if you are on insulin, test 4 times a day. °· If you take medicines by mouth (orally), test 2 times a day. °· If you are on a controlled diet, test once a day. °· If your diabetes is not well controlled or if you are sick, you may need to monitor more often. °HOW TO MONITOR YOUR BLOOD GLUCOSE °Supplies  Needed °· Blood glucose meter. °· Test strips for your meter. Each meter has its own strips. You must use the strips that go with your own meter. °· A pricking needle (lancet). °· A device that holds the lancet (lancing device). °· A journal or log book to write down your results. °Procedure °· Wash your hands with soap and water. Alcohol is not preferred. °· Prick the side of your finger (not the tip) with the lancet. °· Gently milk the finger until a small drop of blood appears. °· Follow the instructions that come with your meter for inserting the test strip, applying blood to the strip, and using your blood glucose meter. °Other Areas to Get Blood for Testing °Some meters allow you to use other areas of your body (other than your finger) to test your blood. These areas are called alternative sites. The most common alternative sites are: °· The forearm. °· The thigh. °· The back area of the lower leg. °· The palm of the hand. °The blood flow in these areas is slower. Therefore, the blood glucose values you get may be delayed, and the numbers are different from what you would get from your fingers. Do not use alternative sites if you think you are having hypoglycemia. Your reading will not be accurate. Always use a finger if you are having hypoglycemia. Also, if you cannot feel your lows (hypoglycemia unawareness), always use your fingers for your   blood glucose checks. ADDITIONAL TIPS FOR GLUCOSE MONITORING  Do not reuse lancets.  Always carry your supplies with you.  All blood glucose meters have a 24-hour "hotline" number to call if you have questions or need help.  Adjust (calibrate) your blood glucose meter with a control solution after finishing a few boxes of strips. BLOOD GLUCOSE RECORD KEEPING It is a good idea to keep a daily record or log of your blood glucose readings. Most glucose meters, if not all, keep your glucose records stored in the meter. Some meters come with the ability to download  your records to your home computer. Keeping a record of your blood glucose readings is especially helpful if you are wanting to look for patterns. Make notes to go along with the blood glucose readings because you might forget what happened at that exact time. Keeping good records helps you and your health care provider to work together to achieve good diabetes management.  Document Released: 05/28/2003 Document Revised: 10/09/2013 Document Reviewed: 10/17/2012 [facility] Patient Information 2015 [place], [place]. This information is not intended to replace advice given to you by your health care provider. Make sure you discuss any questions you have with your health care provider. Today your blood sugar on arrival was 299, UA, given IV fluids and it normalized and into the 150s.  You able to tolerate fluids, your labs were all within normal parameters you are not constipated by x-ray.  Please try to drink more water exercise, drink plenty of green leafy vegetables, fruits and fiber

## 2015-01-18 NOTE — ED Notes (Signed)
CBG=159  

## 2015-01-18 NOTE — ED Notes (Signed)
Discharge instructions/prescription reviewed with patient. Questions answered. Patient refused wheelchair. No acute distress noted at time of discharge.

## 2015-01-18 NOTE — ED Notes (Addendum)
Fluid challenge. RN provided patient with 240 ml ice water. Patient drank entire cup of water without difficulty.

## 2015-05-23 ENCOUNTER — Encounter (HOSPITAL_COMMUNITY): Payer: Self-pay | Admitting: Emergency Medicine

## 2015-05-23 ENCOUNTER — Emergency Department (HOSPITAL_COMMUNITY): Payer: Medicaid Other

## 2015-05-23 ENCOUNTER — Emergency Department (HOSPITAL_COMMUNITY)
Admission: EM | Admit: 2015-05-23 | Discharge: 2015-05-24 | Disposition: A | Discharge status: Home / Self Care | Payer: Medicaid Other | Attending: Emergency Medicine | Admitting: Emergency Medicine

## 2015-05-23 DIAGNOSIS — I10 Essential (primary) hypertension: Secondary | ICD-10-CM | POA: Insufficient documentation

## 2015-05-23 DIAGNOSIS — K219 Gastro-esophageal reflux disease without esophagitis: Secondary | ICD-10-CM | POA: Insufficient documentation

## 2015-05-23 DIAGNOSIS — J45909 Unspecified asthma, uncomplicated: Secondary | ICD-10-CM | POA: Insufficient documentation

## 2015-05-23 DIAGNOSIS — Z794 Long term (current) use of insulin: Secondary | ICD-10-CM | POA: Diagnosis not present

## 2015-05-23 DIAGNOSIS — Z862 Personal history of diseases of the blood and blood-forming organs and certain disorders involving the immune mechanism: Secondary | ICD-10-CM | POA: Diagnosis not present

## 2015-05-23 DIAGNOSIS — E1165 Type 2 diabetes mellitus with hyperglycemia: Secondary | ICD-10-CM | POA: Diagnosis not present

## 2015-05-23 DIAGNOSIS — F419 Anxiety disorder, unspecified: Secondary | ICD-10-CM | POA: Insufficient documentation

## 2015-05-23 DIAGNOSIS — R197 Diarrhea, unspecified: Secondary | ICD-10-CM | POA: Diagnosis not present

## 2015-05-23 DIAGNOSIS — F329 Major depressive disorder, single episode, unspecified: Secondary | ICD-10-CM | POA: Insufficient documentation

## 2015-05-23 DIAGNOSIS — E876 Hypokalemia: Secondary | ICD-10-CM | POA: Diagnosis not present

## 2015-05-23 DIAGNOSIS — R109 Unspecified abdominal pain: Secondary | ICD-10-CM | POA: Diagnosis present

## 2015-05-23 DIAGNOSIS — E669 Obesity, unspecified: Secondary | ICD-10-CM | POA: Insufficient documentation

## 2015-05-23 DIAGNOSIS — R112 Nausea with vomiting, unspecified: Secondary | ICD-10-CM

## 2015-05-23 LAB — COMPREHENSIVE METABOLIC PANEL
ALBUMIN: 2.8 g/dL — AB (ref 3.5–5.0)
ALK PHOS: 97 U/L (ref 38–126)
ALT: 17 U/L (ref 17–63)
AST: 17 U/L (ref 15–41)
Anion gap: 10 (ref 5–15)
BILIRUBIN TOTAL: 0.5 mg/dL (ref 0.3–1.2)
BUN: 6 mg/dL (ref 6–20)
CALCIUM: 9.3 mg/dL (ref 8.9–10.3)
CO2: 26 mmol/L (ref 22–32)
CREATININE: 1.33 mg/dL — AB (ref 0.61–1.24)
Chloride: 103 mmol/L (ref 101–111)
GFR calc Af Amer: >60 mL/min (ref >60)
GLUCOSE: 213 mg/dL — AB (ref 65–99)
POTASSIUM: 2.8 mmol/L — AB (ref 3.5–5.1)
Sodium: 139 mmol/L (ref 135–145)
TOTAL PROTEIN: 7 g/dL (ref 6.5–8.1)

## 2015-05-23 LAB — CBC
HEMATOCRIT: 44.5 % (ref 39.0–52.0)
Hemoglobin: 15 g/dL (ref 13.0–17.0)
MCH: 28.3 pg (ref 26.0–34.0)
MCHC: 33.7 g/dL (ref 30.0–36.0)
MCV: 84 fL (ref 78.0–100.0)
PLATELETS: 253 10*3/uL (ref 150–400)
RBC: 5.3 MIL/uL (ref 4.22–5.81)
RDW: 14.5 % (ref 11.5–15.5)
WBC: 11.6 10*3/uL — ABNORMAL HIGH (ref 4.0–10.5)

## 2015-05-23 LAB — LIPASE, BLOOD: Lipase: 27 U/L (ref 11–51)

## 2015-05-23 MED ORDER — ONDANSETRON HCL 4 MG/2ML IJ SOLN: 4.0000 mg | Freq: Once | INTRAMUSCULAR | Status: DC | PRN

## 2015-05-23 MED ORDER — SODIUM CHLORIDE 0.9 % IV BOLUS (SEPSIS)
1000.0000 mL | Freq: Once | INTRAVENOUS | Status: AC
  Administered 2015-05-23: 1000 mL via INTRAVENOUS

## 2015-05-23 MED ORDER — POTASSIUM CHLORIDE 10 MEQ/100ML IV SOLN
10.0000 meq | INTRAVENOUS | Status: AC
  Administered 2015-05-23 – 2015-05-24 (×2): 10 meq via INTRAVENOUS
  Filled 2015-05-23 (×2): qty 100

## 2015-05-23 MED ORDER — POTASSIUM CHLORIDE CRYS ER 20 MEQ PO TBCR
40.0000 meq | EXTENDED_RELEASE_TABLET | Freq: Once | ORAL | Status: AC
  Administered 2015-05-23: 40 meq via ORAL
  Filled 2015-05-23: qty 2

## 2015-05-23 MED ORDER — LORAZEPAM 2 MG/ML IJ SOLN
1.0000 mg | Freq: Once | INTRAMUSCULAR | Status: AC
Start: 2015-05-23 — End: 2015-05-23
  Administered 2015-05-23: 1 mg via INTRAVENOUS
  Filled 2015-05-23: qty 1

## 2015-05-23 MED ORDER — DICYCLOMINE HCL 10 MG/ML IM SOLN
20.0000 mg | Freq: Once | INTRAMUSCULAR | Status: AC
  Administered 2015-05-23: 20 mg via INTRAMUSCULAR
  Filled 2015-05-23: qty 2

## 2015-05-23 MED ORDER — METOCLOPRAMIDE HCL 5 MG/ML IJ SOLN
10.0000 mg | INTRAMUSCULAR | Status: AC
  Administered 2015-05-23: 10 mg via INTRAVENOUS
  Filled 2015-05-23: qty 2

## 2015-05-23 NOTE — ED Notes (Signed)
PA at bedside.

## 2015-05-23 NOTE — ED Notes (Signed)
Patient to ED via [facility] c/o sudden onset abdominal pain and N/V starting this morning. Patient also c/o high blood sugars last 36 hours - states he has a hard time keeping his sugar under control - has been as high as 400s last couple days. Patient did take insulin and CBG 222 with EMS. Hx: HTN, DM 2, asthma - patient SpO2 initially low with EMS 88% and up to 98% on 2L [place].  EMS VS: HR 80 NSR, 210/120, 98% 2L O2. Patient A&O x 4, appears to be in NAD at this time.

## 2015-05-23 NOTE — ED Provider Notes (Signed)
CSN: [id number]     Arrival date & time 05/23/15  2128 History   First MD Initiated Contact with Patient 05/23/15 2158     Chief Complaint  Patient presents with  . Abdominal Pain  . Hyperglycemia     (Consider location/radiation/quality/duration/timing/severity/associated sxs/prior Treatment) HPI Comments: 44 year old male with a history of morbid obesity, hypertension, sickle cell trait, diabetes mellitus, esophageal reflux, and gastroparesis presents to the emergency department for nausea, vomiting, and diarrhea which began this morning. He states that his symptoms feel similar to his past episodes of gastroparesis. He has had too numerous to count episodes of emesis and dry heaves, none of which have been bloody. He reports about 4-5 episodes of diarrhea today. He denies any sick contacts with similar symptoms, but does state that his wife and kids are sick with the flu. He is having a cramping and intermittent sharp pain generalized in his abdomen without modifying factors. He has had similar pain associated with his gastroparesis before. Patient reports hyperglycemia of 410 which he improved with insulin prior to arrival.   Patient is a 44 y.o. male presenting with abdominal pain and hyperglycemia. The history is provided by the patient. No language interpreter was used.  Abdominal Pain Associated symptoms: diarrhea, nausea and vomiting   Associated symptoms: no dysuria and no fever   Hyperglycemia Associated symptoms: abdominal pain, nausea and vomiting   Associated symptoms: no dysuria and no fever     Past Medical History  Diagnosis Date  . Hypertension   . Neuropathy (HCC)   . Anxiety   . Depression   . Sickle cell trait (HCC)   . Gastroenteritis   . Obesity   . Constipation   . High cholesterol   . Asthma   . Chronic bronchitis (HCC)     "get it q yr" (11/25/2013)  . Sleep apnea     "don't use CPAP" (11/25/2013)  . Type II diabetes mellitus (HCC)   . GERD  (gastroesophageal reflux disease)   . Stomach ulcer   . Fibromyalgia    Past Surgical History  Procedure Laterality Date  . Hip fracture surgery Right 1999    "put a plate in"   Family History  Problem Relation Age of Onset  . Diabetes Father   . Neuropathy Father   . Hypertension Father   . Sickle cell anemia Mother   . Kidney disease Brother   . Kidney disease Sister   . Aneurysm Father   . Heart attack Mother    Social History  Substance Use Topics  . Smoking status: Never Smoker   . Smokeless tobacco: Never Used  . Alcohol Use: No    Review of Systems  Constitutional: Negative for fever.  Gastrointestinal: Positive for nausea, vomiting, abdominal pain and diarrhea.  Genitourinary: Negative for dysuria.  Neurological: Negative for syncope.  All other systems reviewed and are negative.   Allergies  Lisinopril  Home Medications   Prior to Admission medications   Medication Sig Start Date End Date Taking? Authorizing Provider  amLODipine (NORVASC) 10 MG tablet Take 10 mg by mouth daily.    Historical Provider, MD  hydrALAZINE (APRESOLINE) 50 MG tablet Take 50 mg by mouth 3 (three) times daily.    Historical Provider, MD  insulin aspart (NOVOLOG) 100 UNIT/ML injection Inject 10-20 Units into the skin 3 (three) times daily with meals. per sliding scale CBG 70 - 120: 0 units CBG 121 - 150: 2 units CBG 151 - 200: 3  units CBG 201 - 250: 5 units CBG 251 - 300: 8 units CBG 301 - 350: 11 units CBG 351 - 400: 15 units 11/26/13   Shanker Levora Dredge, MD  insulin detemir (LEVEMIR) 100 UNIT/ML injection Inject 80 Units into the skin daily.     Historical Provider, MD  JANUMET XR [phone] MG TB24 Take 1 tablet by mouth daily. 02/15/14   Historical Provider, MD  losartan (COZAAR) 50 MG tablet Take 1 tablet by mouth daily. 02/15/14   Historical Provider, MD  ondansetron (ZOFRAN) 4 MG tablet Take 1 tablet (4 mg total) by mouth every 6 (six) hours. 01/18/15   Earley Favor, NP   ondansetron (ZOFRAN) 4 MG tablet Take 1 tablet (4 mg total) by mouth every 8 (eight) hours as needed for nausea or vomiting. 01/18/15   Earley Favor, NP  Oxycodone HCl 10 MG TABS Take 1 tablet by mouth every 6 (six) hours as needed. 04/17/14   Historical Provider, MD  polyethylene glycol (MIRALAX / GLYCOLAX) packet Take 17 g by mouth daily.    Historical Provider, MD  potassium chloride SA (K-DUR,KLOR-CON) 20 MEQ tablet Take 20 mEq by mouth daily.    Historical Provider, MD  pregabalin (LYRICA) 150 MG capsule Take 150 mg by mouth 3 (three) times daily.    Historical Provider, MD   BP 137/68 mmHg  Pulse 101  Resp 18  SpO2 97%   Physical Exam  Constitutional: He is oriented to person, place, and time. He appears well-developed and well-nourished. No distress.  Nontoxic/nonseptic appearing  HENT:  Head: Normocephalic and atraumatic.  Eyes: Conjunctivae and EOM are normal. No scleral icterus.  Neck: Normal range of motion.  Cardiovascular: Normal rate, regular rhythm and intact distal pulses.   Pulmonary/Chest: Effort normal and breath sounds normal. No respiratory distress. He has no wheezes. He has no rales.  Lungs CTAB. Respirations even and unlabored  Abdominal:  Soft super morbidly obese abdomen. No distension noted. Exam limited due to body habitus.  Musculoskeletal: Normal range of motion.  Neurological: He is alert and oriented to person, place, and time. He exhibits normal muscle tone. Coordination normal.  GCS 15. Speech is goal oriented. Patient moving all extremities.  Skin: Skin is warm and dry. No rash noted. He is not diaphoretic. No erythema. No pallor.  Psychiatric: He has a normal mood and affect. His behavior is normal.  Nursing note and vitals reviewed.   ED Course  Procedures (including critical care time) Labs Review Labs Reviewed  COMPREHENSIVE METABOLIC PANEL - Abnormal; Notable for the following:    Potassium 2.8 (*)    Glucose, Bld 213 (*)    Creatinine,  Ser 1.33 (*)    Albumin 2.8 (*)    All other components within normal limits  CBC - Abnormal; Notable for the following:    WBC 11.6 (*)    All other components within normal limits  URINALYSIS, ROUTINE W REFLEX MICROSCOPIC (NOT AT [facility]) - Abnormal; Notable for the following:    Glucose, UA >1000 (*)    Hgb urine dipstick SMALL (*)    Protein, ur >300 (*)    All other components within normal limits  URINE MICROSCOPIC-ADD ON - Abnormal; Notable for the following:    Squamous Epithelial / LPF 0-5 (*)    Bacteria, UA RARE (*)    All other components within normal limits  LIPASE, BLOOD    Imaging Review Dg Abd 2 Views  05/23/2015  CLINICAL DATA:  Abdominal pain and  vomiting today. EXAM: ABDOMEN - 2 VIEW COMPARISON:  01/17/2015 FINDINGS: Examination is technically limited due to body habitus. There is stool and gas throughout the colon. No small or large bowel distention. No free intra-abdominal air. No abnormal air-fluid levels. Postoperative changes with plate and screw fixation of the right hemipelvis. Several of the screws are not flush with the plate but this appearance is unchanged since previous study. Calcified phleboliths in the pelvis. No radiopaque stones identified. IMPRESSION: Limited study due to patient body habitus. Nonobstructive bowel gas pattern. Electronically Signed   By: Burman Nieves M.D.   On: 05/23/2015 23:12     I have personally reviewed and evaluated these images and lab results as part of my medical decision-making.   EKG Interpretation None      MDM   Final diagnoses:  Nausea and vomiting  Hypokalemia    44 year old male presents to the emergency department for abdominal pain, nausea, and vomiting. He has a history of gastroparesis and states that his symptoms feel similar to this. Patient is super morbidly obese. His abdominal exam is limited due to his body habitus. He has no evidence of obstruction on x-ray. Laboratory workup notable for  leukocytosis of 11.6 which is likely due to frequent emesis and dry heaves. Hypokalemia appears chronic for the patient. This has been repleted in the emergency department. There is hyperglycemia without evidence of DKA.  Symptoms managed in the emergency department with IV fluids as well as Ativan, Bentyl, and Reglan. Patient has had resolution of his nausea and emesis and has been able to tolerate fluids as well as a sandwich and crackers. He feels comfortable managing his symptoms further as an outpatient. Will refer to his primary care doctor for follow-up. Return precautions provided. Patient discharged in satisfactory condition with no unaddressed concerns; VSS.   Filed Vitals:   05/24/15 0015 05/24/15 0030 05/24/15 0127 05/24/15 0207  BP: 168/86  169/91 161/100  Pulse: 102 105 94 97  Temp:    98 F (36.7 C)  TempSrc:    Oral  Resp:    18  SpO2:  100% 97% 97%     Antony Madura, PA-C 05/24/15 0530  Benjiman Core, MD 05/24/15 1606

## 2015-05-24 ENCOUNTER — Encounter (HOSPITAL_COMMUNITY): Payer: Self-pay | Admitting: Emergency Medicine

## 2015-05-24 ENCOUNTER — Emergency Department (HOSPITAL_COMMUNITY)
Admission: EM | Admit: 2015-05-24 | Discharge: 2015-05-25 | Disposition: A | Discharge status: Home / Self Care | Payer: Medicaid Other | Source: Home / Self Care | Attending: Emergency Medicine | Admitting: Emergency Medicine

## 2015-05-24 DIAGNOSIS — E119 Type 2 diabetes mellitus without complications: Secondary | ICD-10-CM

## 2015-05-24 DIAGNOSIS — Z7984 Long term (current) use of oral hypoglycemic drugs: Secondary | ICD-10-CM

## 2015-05-24 DIAGNOSIS — I10 Essential (primary) hypertension: Secondary | ICD-10-CM

## 2015-05-24 DIAGNOSIS — E669 Obesity, unspecified: Secondary | ICD-10-CM

## 2015-05-24 DIAGNOSIS — K3184 Gastroparesis: Secondary | ICD-10-CM | POA: Insufficient documentation

## 2015-05-24 DIAGNOSIS — G43A0 Cyclical vomiting, not intractable: Secondary | ICD-10-CM | POA: Insufficient documentation

## 2015-05-24 DIAGNOSIS — K219 Gastro-esophageal reflux disease without esophagitis: Secondary | ICD-10-CM

## 2015-05-24 DIAGNOSIS — J45909 Unspecified asthma, uncomplicated: Secondary | ICD-10-CM

## 2015-05-24 DIAGNOSIS — Z862 Personal history of diseases of the blood and blood-forming organs and certain disorders involving the immune mechanism: Secondary | ICD-10-CM | POA: Insufficient documentation

## 2015-05-24 DIAGNOSIS — M797 Fibromyalgia: Secondary | ICD-10-CM

## 2015-05-24 DIAGNOSIS — G8929 Other chronic pain: Secondary | ICD-10-CM | POA: Insufficient documentation

## 2015-05-24 DIAGNOSIS — K5900 Constipation, unspecified: Secondary | ICD-10-CM | POA: Insufficient documentation

## 2015-05-24 DIAGNOSIS — R1115 Cyclical vomiting syndrome unrelated to migraine: Secondary | ICD-10-CM

## 2015-05-24 DIAGNOSIS — Z794 Long term (current) use of insulin: Secondary | ICD-10-CM

## 2015-05-24 DIAGNOSIS — Z79899 Other long term (current) drug therapy: Secondary | ICD-10-CM | POA: Insufficient documentation

## 2015-05-24 DIAGNOSIS — Z8659 Personal history of other mental and behavioral disorders: Secondary | ICD-10-CM | POA: Insufficient documentation

## 2015-05-24 DIAGNOSIS — R109 Unspecified abdominal pain: Principal | ICD-10-CM

## 2015-05-24 LAB — URINALYSIS, ROUTINE W REFLEX MICROSCOPIC
BILIRUBIN URINE: NEGATIVE
Glucose, UA: >1000 mg/dL — AB
Ketones, ur: NEGATIVE mg/dL
Leukocytes, UA: NEGATIVE
NITRITE: NEGATIVE
Protein, ur: >300 mg/dL — AB
SPECIFIC GRAVITY, URINE: 1.018 (ref 1.005–1.030)
pH: 7 (ref 5.0–8.0)

## 2015-05-24 LAB — COMPREHENSIVE METABOLIC PANEL
ALBUMIN: 2.6 g/dL — AB (ref 3.5–5.0)
ALT: 16 U/L — ABNORMAL LOW (ref 17–63)
AST: 17 U/L (ref 15–41)
Alkaline Phosphatase: 85 U/L (ref 38–126)
Anion gap: 8 (ref 5–15)
BILIRUBIN TOTAL: 0.5 mg/dL (ref 0.3–1.2)
BUN: 5 mg/dL — AB (ref 6–20)
CO2: 26 mmol/L (ref 22–32)
Calcium: 8.4 mg/dL — ABNORMAL LOW (ref 8.9–10.3)
Chloride: 103 mmol/L (ref 101–111)
Creatinine, Ser: 1.47 mg/dL — ABNORMAL HIGH (ref 0.61–1.24)
GFR calc Af Amer: >60 mL/min (ref >60)
GFR calc non Af Amer: 56 mL/min — ABNORMAL LOW (ref >60)
GLUCOSE: 357 mg/dL — AB (ref 65–99)
POTASSIUM: 3.4 mmol/L — AB (ref 3.5–5.1)
SODIUM: 137 mmol/L (ref 135–145)
TOTAL PROTEIN: 6.3 g/dL — AB (ref 6.5–8.1)

## 2015-05-24 LAB — CBC WITH DIFFERENTIAL/PLATELET
BASOS ABS: 0 10*3/uL (ref 0.0–0.1)
BASOS PCT: 0 %
EOS PCT: 0 %
Eosinophils Absolute: 0 10*3/uL (ref 0.0–0.7)
HCT: 43.3 % (ref 39.0–52.0)
Hemoglobin: 14.4 g/dL (ref 13.0–17.0)
Lymphocytes Relative: 10 %
Lymphs Abs: 1 10*3/uL (ref 0.7–4.0)
MCH: 28.2 pg (ref 26.0–34.0)
MCHC: 33.3 g/dL (ref 30.0–36.0)
MCV: 84.7 fL (ref 78.0–100.0)
MONO ABS: 0.6 10*3/uL (ref 0.1–1.0)
Monocytes Relative: 6 %
Neutro Abs: 7.9 10*3/uL — ABNORMAL HIGH (ref 1.7–7.7)
Neutrophils Relative %: 84 %
PLATELETS: 229 10*3/uL (ref 150–400)
RBC: 5.11 MIL/uL (ref 4.22–5.81)
RDW: 14.8 % (ref 11.5–15.5)
WBC: 9.5 10*3/uL (ref 4.0–10.5)

## 2015-05-24 LAB — URINE MICROSCOPIC-ADD ON

## 2015-05-24 LAB — LIPASE, BLOOD: Lipase: 66 U/L — ABNORMAL HIGH (ref 11–51)

## 2015-05-24 LAB — CBG MONITORING, ED: Glucose-Capillary: 302 mg/dL — ABNORMAL HIGH (ref 65–99)

## 2015-05-24 MED ORDER — SODIUM CHLORIDE 0.9 % IV BOLUS (SEPSIS)
1000.0000 mL | Freq: Once | INTRAVENOUS | Status: AC
  Administered 2015-05-24: 1000 mL via INTRAVENOUS

## 2015-05-24 MED ORDER — DICYCLOMINE HCL 20 MG PO TABS: 20.0000 mg | ORAL_TABLET | Freq: Two times a day (BID) | ORAL | Status: DC

## 2015-05-24 MED ORDER — ONDANSETRON HCL 4 MG PO TABS: 4.0000 mg | ORAL_TABLET | Freq: Four times a day (QID) | ORAL | Status: DC

## 2015-05-24 MED ORDER — MORPHINE SULFATE (PF) 4 MG/ML IV SOLN
8.0000 mg | Freq: Once | INTRAVENOUS | Status: AC
  Administered 2015-05-24: 8 mg via INTRAVENOUS
  Filled 2015-05-24: qty 2

## 2015-05-24 MED ORDER — METOCLOPRAMIDE HCL 5 MG/ML IJ SOLN
10.0000 mg | Freq: Once | INTRAMUSCULAR | Status: AC
  Administered 2015-05-24: 10 mg via INTRAVENOUS
  Filled 2015-05-24: qty 2

## 2015-05-24 MED ORDER — METOPROLOL TARTRATE 1 MG/ML IV SOLN
5.0000 mg | Freq: Once | INTRAVENOUS | Status: AC
Start: 2015-05-24 — End: 2015-05-24
  Administered 2015-05-24: 5 mg via INTRAVENOUS
  Filled 2015-05-24: qty 5

## 2015-05-24 MED ORDER — ONDANSETRON HCL 4 MG/2ML IJ SOLN
4.0000 mg | Freq: Once | INTRAMUSCULAR | Status: AC | PRN
  Administered 2015-05-24: 4 mg via INTRAVENOUS
  Filled 2015-05-24: qty 2

## 2015-05-24 MED ORDER — METOCLOPRAMIDE HCL 10 MG PO TABS: 10.0000 mg | ORAL_TABLET | Freq: Four times a day (QID) | ORAL | Status: DC | PRN

## 2015-05-24 MED ORDER — DICYCLOMINE HCL 10 MG/ML IM SOLN
20.0000 mg | Freq: Once | INTRAMUSCULAR | Status: AC
  Administered 2015-05-24: 20 mg via INTRAMUSCULAR
  Filled 2015-05-24: qty 2

## 2015-05-24 NOTE — Discharge Instructions (Signed)
Gastroparesis °Gastroparesis, also called delayed gastric emptying, is a condition in which food takes longer than normal to empty from the stomach. The condition is usually long-lasting (chronic). °CAUSES °This condition may be caused by: °· An endocrine disorder, such as hypothyroidism or diabetes. Diabetes is the most common cause of this condition. °· A nervous system disease, such as Parkinson disease or multiple sclerosis. °· Cancer, infection, or surgery of the stomach or vagus nerve. °· A connective tissue disorder, such as scleroderma. °· Certain medicines. °In most cases, the cause is not known. °RISK FACTORS °This condition is more likely to develop in: °· People with certain disorders, including endocrine disorders, eating disorders, amyloidosis, and scleroderma. °· People with certain diseases, including Parkinson disease or multiple sclerosis. °· People with cancer or infection of the stomach or vagus nerve. °· People who have had surgery on the stomach or vagus nerve. °· People who take certain medicines. °· Women. °SYMPTOMS °Symptoms of this condition include: °· An early feeling of fullness when eating. °· Nausea. °· Weight loss. °· Vomiting. °· Heartburn. °· Abdominal bloating. °· Inconsistent blood glucose levels. °· Lack of appetite. °· Acid from the stomach coming up into the esophagus (gastroesophageal reflux). °· Spasms of the stomach. °Symptoms may come and go. °DIAGNOSIS °This condition is diagnosed with tests, such as: °· Tests that check how long it takes food to move through the stomach and intestines. These tests include: °¨ Upper gastrointestinal (GI) series. In this test, X-rays of the intestines are taken after you drink a liquid. The liquid makes the intestines show up better on the X-rays. °¨ Gastric emptying scintigraphy. In this test, scans are taken after you eat food that contains a small amount of radioactive material. °¨ Wireless capsule GI monitoring system. This test  involves swallowing a capsule that records information about movement through the stomach. °· Gastric manometry. This test measures electrical and muscular activity in the stomach. It is done with a thin tube that is passed down the throat and into the stomach. °· Endoscopy. This test checks for abnormalities in the lining of the stomach. It is done with a long, thin tube that is passed down the throat and into the stomach. °· An ultrasound. This test can help rule out gallbladder disease or pancreatitis as a cause of your symptoms. It uses sound waves to take pictures of the inside of your body. °TREATMENT °There is no cure for gastroparesis. This condition may be managed with: °· Treatment of the underlying condition causing the gastroparesis. °· Lifestyle changes, including exercise and dietary changes. Dietary changes can include: °¨ Changes in what and when you eat. °¨ Eating smaller meals more often. °¨ Eating low-fat foods. °¨ Eating low-fiber forms of high-fiber foods, such as cooked vegetables instead of raw vegetables. °¨ Having liquid foods in place of solid foods. Liquid foods are easier to digest. °· Medicines. These may be given to control nausea and vomiting and to stimulate stomach muscles. °· Getting food through a feeding tube. This may be done in severe cases. °· A gastric neurostimulator. This is a device that is inserted into the body with surgery. It helps improve stomach emptying and control nausea and vomiting. °HOME CARE INSTRUCTIONS °· Follow your health care provider's instructions about exercise and diet. °· Take medicines only as directed by your health care provider. °SEEK MEDICAL CARE IF: °· Your symptoms do not improve with treatment. °· You have new symptoms. °SEEK IMMEDIATE MEDICAL CARE IF: °· You have   severe abdominal pain that does not improve with treatment. °· You have nausea that does not go away. °· You cannot keep fluids down. °  °This information is not intended to replace  advice given to you by your health care provider. Make sure you discuss any questions you have with your health care provider. °  °Document Released: 05/25/2005 Document Revised: 10/09/2014 Document Reviewed: 05/21/2014 °Elsevier Interactive Patient Education ©2016 [employer] ° °

## 2015-05-24 NOTE — ED Notes (Signed)
Patient undressed, in gown, on monitor, continuous pulse oximetry and blood pressure cuff 

## 2015-05-24 NOTE — ED Notes (Signed)
Provided patient with [place] sandwich and gingerale. Patient denies N/V at this time, states his pain is gone and he feels much better.

## 2015-05-24 NOTE — ED Notes (Signed)
Pt here from home with c/o n/v and abd pain , pt was here yesterday for same , pt states that the home meds did not help

## 2015-05-24 NOTE — ED Provider Notes (Signed)
CSN: [id number]     Arrival date & time 05/24/15  1811 History   First MD Initiated Contact with Patient 05/24/15 1816     Chief Complaint  Patient presents with  . Abdominal Pain  . Nausea  . Emesis    44 year old male who presents today with abdominal pain. Been present for 2 years according to patient. Notes it flares up on him occasionally and was just here yesterday for the same. Requesting morphine for pain control at this time. Accompanied by nausea and vomiting. Has vomited about 6 times a day, nonbilious nonbloody. Often accompanied by several episodes of watery diarrhea. Endorses no recent travel and no sick contacts and has not eaten any suspicious food. Abdominal pain does not radiate. Dental achy and sharp in sensation. Made worse with eating. Says he has been unable to take his insulin today.   He denies CP, SOB, fever, chills, constipation, hematemesis, dysuria, hematuria, sick contacts, or recent travel.  Patient is a 44 y.o. male presenting with abdominal pain and vomiting.  Abdominal Pain Pain location:  Periumbilical Pain quality: aching and sharp   Pain radiates to:  Does not radiate Timing:  Constant Progression:  Worsening Chronicity:  Recurrent Associated symptoms: diarrhea, nausea and vomiting   Associated symptoms: no chest pain, no constipation, no dysuria and no hematuria   Emesis Associated symptoms: abdominal pain and diarrhea     Past Medical History  Diagnosis Date  . Hypertension   . Neuropathy (HCC)   . Anxiety   . Depression   . Sickle cell trait (HCC)   . Gastroenteritis   . Obesity   . Constipation   . High cholesterol   . Asthma   . Chronic bronchitis (HCC)     "get it q yr" (11/25/2013)  . Sleep apnea     "don't use CPAP" (11/25/2013)  . Type II diabetes mellitus (HCC)   . GERD (gastroesophageal reflux disease)   . Stomach ulcer   . Fibromyalgia    Past Surgical History  Procedure Laterality Date  . Hip fracture surgery Right 1999     "put a plate in"   Family History  Problem Relation Age of Onset  . Diabetes Father   . Neuropathy Father   . Hypertension Father   . Sickle cell anemia Mother   . Kidney disease Brother   . Kidney disease Sister   . Aneurysm Father   . Heart attack Mother    Social History  Substance Use Topics  . Smoking status: Never Smoker   . Smokeless tobacco: Never Used  . Alcohol Use: No    Review of Systems  Constitutional: Negative for diaphoresis.  HENT: Negative for congestion.   Cardiovascular: Negative for chest pain.  Gastrointestinal: Positive for nausea, vomiting, abdominal pain and diarrhea. Negative for constipation.  Genitourinary: Negative for dysuria and hematuria.  All other systems reviewed and are negative.     Allergies  Lisinopril  Home Medications   Prior to Admission medications   Medication Sig Start Date End Date Taking? Authorizing Provider  amLODipine (NORVASC) 10 MG tablet Take 10 mg by mouth daily.    Historical Provider, MD  dicyclomine (BENTYL) 20 MG tablet Take 1 tablet (20 mg total) by mouth 2 (two) times daily. 05/24/15   Antony MaduraKelly Humes, PA-C  hydrALAZINE (APRESOLINE) 50 MG tablet Take 50 mg by mouth 3 (three) times daily.    Historical Provider, MD  insulin aspart (NOVOLOG) 100 UNIT/ML injection Inject 10-20 Units into the  skin 3 (three) times daily with meals. per sliding scale CBG 70 - 120: 0 units CBG 121 - 150: 2 units CBG 151 - 200: 3 units CBG 201 - 250: 5 units CBG 251 - 300: 8 units CBG 301 - 350: 11 units CBG 351 - 400: 15 units 11/26/13   Shanker Levora Dredge, MD  insulin detemir (LEVEMIR) 100 UNIT/ML injection Inject 80 Units into the skin daily.     Historical Provider, MD  JANUMET XR [phone] MG TB24 Take 1 tablet by mouth daily. 02/15/14   Historical Provider, MD  losartan (COZAAR) 50 MG tablet Take 1 tablet by mouth daily. 02/15/14   Historical Provider, MD  metoCLOPramide (REGLAN) 10 MG tablet Take 1-2 tablets (10-20 mg total) by  mouth every 6 (six) hours as needed for nausea or vomiting. 05/24/15   Rachelle Hora, MD  ondansetron (ZOFRAN) 4 MG tablet Take 1 tablet (4 mg total) by mouth every 6 (six) hours. 05/24/15   Antony Madura, PA-C  Oxycodone HCl 10 MG TABS Take 1 tablet by mouth every 6 (six) hours as needed. 04/17/14   Historical Provider, MD  polyethylene glycol (MIRALAX / GLYCOLAX) packet Take 17 g by mouth daily.    Historical Provider, MD  potassium chloride SA (K-DUR,KLOR-CON) 20 MEQ tablet Take 20 mEq by mouth daily.    Historical Provider, MD  pregabalin (LYRICA) 150 MG capsule Take 150 mg by mouth 3 (three) times daily.    Historical Provider, MD   BP 201/139 mmHg  Pulse 98  Temp(Src) 98.3 F (36.8 C) (Oral)  Resp 15  SpO2 100% Physical Exam  Constitutional: He is oriented to person, place, and time. No distress.  Obese. Moaning in pain   HENT:  Head: Normocephalic and atraumatic.  Neck: Normal range of motion.  Cardiovascular: Normal rate, normal heart sounds and intact distal pulses.   hypertensive  Pulmonary/Chest: Effort normal and breath sounds normal.  Abdominal: Bowel sounds are normal. He exhibits no distension and no mass. There is tenderness. There is no rebound and no guarding.  Obese abdomen.   Musculoskeletal: Normal range of motion.  Neurological: He is alert and oriented to person, place, and time.  Skin: Skin is warm and dry. He is not diaphoretic. No pallor.  Nursing note and vitals reviewed.   ED Course  Procedures (including critical care time) Labs Review Labs Reviewed  CBC WITH DIFFERENTIAL/PLATELET - Abnormal; Notable for the following:    Neutro Abs 7.9 (*)    All other components within normal limits  COMPREHENSIVE METABOLIC PANEL - Abnormal; Notable for the following:    Potassium 3.4 (*)    Glucose, Bld 357 (*)    BUN 5 (*)    Creatinine, Ser 1.47 (*)    Calcium 8.4 (*)    Total Protein 6.3 (*)    Albumin 2.6 (*)    ALT 16 (*)    GFR calc non Af Amer 56 (*)     All other components within normal limits  LIPASE, BLOOD - Abnormal; Notable for the following:    Lipase 66 (*)    All other components within normal limits  CBG MONITORING, ED - Abnormal; Notable for the following:    Glucose-Capillary 302 (*)    All other components within normal limits  URINALYSIS, ROUTINE W REFLEX MICROSCOPIC (NOT AT [facility])    Imaging Review Dg Abd 2 Views  05/23/2015  CLINICAL DATA:  Abdominal pain and vomiting today. EXAM: ABDOMEN - 2 VIEW COMPARISON:  01/17/2015 FINDINGS: Examination is technically limited due to body habitus. There is stool and gas throughout the colon. No small or large bowel distention. No free intra-abdominal air. No abnormal air-fluid levels. Postoperative changes with plate and screw fixation of the right hemipelvis. Several of the screws are not flush with the plate but this appearance is unchanged since previous study. Calcified phleboliths in the pelvis. No radiopaque stones identified. IMPRESSION: Limited study due to patient body habitus. Nonobstructive bowel gas pattern. Electronically Signed   By: Burman Nieves M.D.   On: 05/23/2015 23:12   I have personally reviewed and evaluated these images and lab results as part of my medical decision-making.   EKG Interpretation None      MDM   Final diagnoses:  Chronic abdominal pain  Gastroparesis  Non-intractable cyclical vomiting with nausea    A 44yo  African-American male here with chronic abdominal pain. Was seen in the ED for similar yesterday. Patient says he went home with the antinausea medicines and did not help. He returns today with a feeling aching sharp central abdominal pain. Accompanied with nausea and vomiting. Patient's isnonbloody. Vomit normally 3 times a day that in the past 25, and 6 times. He also had several watery bowel movements.  On exam in ED ASV SS aside from hypertension. Patient notes he did not take his antihypertensives given that he is nauseous and  vomiting. Also notes that he did not take his insulin today as well. On exam abdomen is soft with minimal tenderness. Despondent abdomen. Physical exam is limited due to obesity hydrated. However nothing seems to be significantly acute abdomen. We'll obtain labs to evaluate possible infection.  Glucose elevated >300. Given IV fluids morphine Bentyl and Zofran.  Labs are unremarkable. Creatinine slightly elevated from baseline. Given fluids. No signs of pancreatitis and cholecystitis cholelithiasis. Has not had any nausea or vomiting while here. Patient says the pain was improved by morphine. Discussed with him that with chronic abdominal pain narcotic pain medications are not recommended. This he says the Zofran did not help we'll discharge him home with Reglan. He also has bentyl prescribed yesterday. Recommend following up to his endocrinologist, Dr. Sharl Ma. Sugars return to the ED with any severe signs of illness including severe abdominal pain unrelenting nausea and vomiting fever chills or other signs of systemic disease.    Pt was seen under the supervision of Dr. Jeraldine Loots.    Rachelle Hora, MD 05/24/15 2328  The patient was seen in conjunction with the resident physician.  The documentation accurately reflects the patient's evaluation in the Emergency Department, with the following additions / clarifications.  On my evaluation, this patient appeared uncomfortable, but was awake, alert, interacting appropriately, with stable vital signs. No evidence for peritonitis. Patient has chronic pain, and with reassuring findings, improvement in condition, the patient was discharged to follow-up as an outpatient.  Gerhard Munch, MD 05/25/15 [id number]

## 2015-05-24 NOTE — ED Notes (Signed)
Patient verbalized understanding of discharge instructions and denies any further needs or questions at this time. VS stable, patient ambulatory with use of cane.  Assisted patient in wheelchair to ED entrance.

## 2015-05-29 ENCOUNTER — Emergency Department (HOSPITAL_COMMUNITY)
Admission: EM | Admit: 2015-05-29 | Discharge: 2015-05-29 | Disposition: A | Discharge status: Home / Self Care | Payer: Medicaid Other | Attending: Emergency Medicine | Admitting: Emergency Medicine

## 2015-05-29 ENCOUNTER — Encounter (HOSPITAL_COMMUNITY): Payer: Self-pay | Admitting: Emergency Medicine

## 2015-05-29 DIAGNOSIS — Z794 Long term (current) use of insulin: Secondary | ICD-10-CM | POA: Diagnosis not present

## 2015-05-29 DIAGNOSIS — R112 Nausea with vomiting, unspecified: Secondary | ICD-10-CM | POA: Insufficient documentation

## 2015-05-29 DIAGNOSIS — F419 Anxiety disorder, unspecified: Secondary | ICD-10-CM | POA: Diagnosis not present

## 2015-05-29 DIAGNOSIS — J45909 Unspecified asthma, uncomplicated: Secondary | ICD-10-CM | POA: Diagnosis not present

## 2015-05-29 DIAGNOSIS — Z862 Personal history of diseases of the blood and blood-forming organs and certain disorders involving the immune mechanism: Secondary | ICD-10-CM | POA: Insufficient documentation

## 2015-05-29 DIAGNOSIS — E119 Type 2 diabetes mellitus without complications: Secondary | ICD-10-CM | POA: Insufficient documentation

## 2015-05-29 DIAGNOSIS — R1084 Generalized abdominal pain: Secondary | ICD-10-CM | POA: Insufficient documentation

## 2015-05-29 DIAGNOSIS — I10 Essential (primary) hypertension: Secondary | ICD-10-CM | POA: Insufficient documentation

## 2015-05-29 DIAGNOSIS — Z79899 Other long term (current) drug therapy: Secondary | ICD-10-CM | POA: Insufficient documentation

## 2015-05-29 DIAGNOSIS — G8929 Other chronic pain: Secondary | ICD-10-CM | POA: Insufficient documentation

## 2015-05-29 DIAGNOSIS — E782 Mixed hyperlipidemia: Secondary | ICD-10-CM | POA: Insufficient documentation

## 2015-05-29 DIAGNOSIS — K219 Gastro-esophageal reflux disease without esophagitis: Secondary | ICD-10-CM | POA: Diagnosis not present

## 2015-05-29 DIAGNOSIS — R109 Unspecified abdominal pain: Secondary | ICD-10-CM | POA: Diagnosis present

## 2015-05-29 DIAGNOSIS — M797 Fibromyalgia: Secondary | ICD-10-CM | POA: Insufficient documentation

## 2015-05-29 DIAGNOSIS — F329 Major depressive disorder, single episode, unspecified: Secondary | ICD-10-CM | POA: Diagnosis not present

## 2015-05-29 LAB — CBC
HEMATOCRIT: 40.7 % (ref 39.0–52.0)
Hemoglobin: 13.5 g/dL (ref 13.0–17.0)
MCH: 28.4 pg (ref 26.0–34.0)
MCHC: 33.2 g/dL (ref 30.0–36.0)
MCV: 85.5 fL (ref 78.0–100.0)
Platelets: 246 10*3/uL (ref 150–400)
RBC: 4.76 MIL/uL (ref 4.22–5.81)
RDW: 15 % (ref 11.5–15.5)
WBC: 10.2 10*3/uL (ref 4.0–10.5)

## 2015-05-29 LAB — COMPREHENSIVE METABOLIC PANEL
ALBUMIN: 2.3 g/dL — AB (ref 3.5–5.0)
ALT: 17 U/L (ref 17–63)
AST: 14 U/L — AB (ref 15–41)
Alkaline Phosphatase: 85 U/L (ref 38–126)
Anion gap: 8 (ref 5–15)
BUN: 9 mg/dL (ref 6–20)
CHLORIDE: 102 mmol/L (ref 101–111)
CO2: 31 mmol/L (ref 22–32)
Calcium: 8.9 mg/dL (ref 8.9–10.3)
Creatinine, Ser: 1.44 mg/dL — ABNORMAL HIGH (ref 0.61–1.24)
GFR calc Af Amer: >60 mL/min (ref >60)
GFR calc non Af Amer: 57 mL/min — ABNORMAL LOW (ref >60)
GLUCOSE: 278 mg/dL — AB (ref 65–99)
POTASSIUM: 3.1 mmol/L — AB (ref 3.5–5.1)
Sodium: 141 mmol/L (ref 135–145)
Total Bilirubin: 0.4 mg/dL (ref 0.3–1.2)
Total Protein: 5.8 g/dL — ABNORMAL LOW (ref 6.5–8.1)

## 2015-05-29 LAB — RAPID URINE DRUG SCREEN, HOSP PERFORMED
Amphetamines: NOT DETECTED
BARBITURATES: NOT DETECTED
Benzodiazepines: NOT DETECTED
COCAINE: NOT DETECTED
Opiates: NOT DETECTED
Tetrahydrocannabinol: NOT DETECTED

## 2015-05-29 LAB — LIPASE, BLOOD: LIPASE: 53 U/L — AB (ref 11–51)

## 2015-05-29 MED ORDER — HALOPERIDOL 2 MG PO TABS
3.0000 mg | ORAL_TABLET | Freq: Once | ORAL | Status: AC
  Administered 2015-05-29: 3 mg via ORAL
  Filled 2015-05-29: qty 1

## 2015-05-29 MED ORDER — KETOROLAC TROMETHAMINE 30 MG/ML IJ SOLN
30.0000 mg | Freq: Once | INTRAMUSCULAR | Status: AC
  Administered 2015-05-29: 30 mg via INTRAVENOUS
  Filled 2015-05-29: qty 1

## 2015-05-29 MED ORDER — DICYCLOMINE HCL 10 MG/ML IM SOLN
20.0000 mg | Freq: Once | INTRAMUSCULAR | Status: AC
  Administered 2015-05-29: 20 mg via INTRAMUSCULAR
  Filled 2015-05-29: qty 2

## 2015-05-29 MED ORDER — DICYCLOMINE HCL 20 MG PO TABS: 20.0000 mg | ORAL_TABLET | Freq: Two times a day (BID) | ORAL | Status: DC

## 2015-05-29 MED ORDER — METOCLOPRAMIDE HCL 5 MG/ML IJ SOLN
10.0000 mg | Freq: Once | INTRAMUSCULAR | Status: AC
  Administered 2015-05-29: 10 mg via INTRAVENOUS
  Filled 2015-05-29: qty 2

## 2015-05-29 MED ORDER — METOCLOPRAMIDE HCL 10 MG PO TABS: 10.0000 mg | ORAL_TABLET | Freq: Four times a day (QID) | ORAL | Status: DC | PRN

## 2015-05-29 NOTE — ED Provider Notes (Signed)
CSN: [id number]     Arrival date & time 05/29/15  0423 History   First MD Initiated Contact with Patient 05/29/15 [phone]     Chief Complaint  Patient presents with  . Abdominal Pain  . Nausea  . Emesis     (Consider location/radiation/quality/duration/timing/severity/associated sxs/prior Treatment) HPI MATHIAS BOGACKI is a 44 y.o. male with a history of morbid obesity, diabetes with gastroparesis and chronic abdominal pain comes in for evaluation of acute abdominal pain with nausea and vomiting. Patient reports he has had a worsening of his chronic abdominal pain since yesterday at 11 AM. Rates his pain as 10/10. He reports taking one of his Percocet at home without relief of his symptoms. He reports associated nausea and vomiting, nonbloody or bilious. Last bowel movement this morning and was normal for him. No fevers, chills, urinary symptoms, chest pain or shortness of breath, back pain. Patient is requesting morphine.  Past Medical History  Diagnosis Date  . Hypertension   . Neuropathy (HCC)   . Anxiety   . Depression   . Sickle cell trait (HCC)   . Gastroenteritis   . Obesity   . Constipation   . High cholesterol   . Asthma   . Chronic bronchitis (HCC)     "get it q yr" (11/25/2013)  . Sleep apnea     "don't use CPAP" (11/25/2013)  . Type II diabetes mellitus (HCC)   . GERD (gastroesophageal reflux disease)   . Stomach ulcer   . Fibromyalgia    Past Surgical History  Procedure Laterality Date  . Hip fracture surgery Right 1999    "put a plate in"   Family History  Problem Relation Age of Onset  . Diabetes Father   . Neuropathy Father   . Hypertension Father   . Sickle cell anemia Mother   . Kidney disease Brother   . Kidney disease Sister   . Aneurysm Father   . Heart attack Mother    Social History  Substance Use Topics  . Smoking status: Never Smoker   . Smokeless tobacco: Never Used  . Alcohol Use: No    Review of Systems A 10 point review of systems  was completed and was negative except for pertinent positives and negatives as mentioned in the history of present illness     Allergies  Lisinopril  Home Medications   Prior to Admission medications   Medication Sig Start Date End Date Taking? Authorizing Provider  amLODipine (NORVASC) 10 MG tablet Take 10 mg by mouth daily.    Historical Provider, MD  dicyclomine (BENTYL) 20 MG tablet Take 1 tablet (20 mg total) by mouth 2 (two) times daily. 05/29/15   Joycie Peek, PA-C  hydrALAZINE (APRESOLINE) 50 MG tablet Take 50 mg by mouth 3 (three) times daily.    Historical Provider, MD  insulin aspart (NOVOLOG) 100 UNIT/ML injection Inject 10-20 Units into the skin 3 (three) times daily with meals. per sliding scale CBG 70 - 120: 0 units CBG 121 - 150: 2 units CBG 151 - 200: 3 units CBG 201 - 250: 5 units CBG 251 - 300: 8 units CBG 301 - 350: 11 units CBG 351 - 400: 15 units 11/26/13   Shanker Levora Dredge, MD  insulin detemir (LEVEMIR) 100 UNIT/ML injection Inject 80 Units into the skin daily.     Historical Provider, MD  JANUMET XR [phone] MG TB24 Take 1 tablet by mouth daily. 02/15/14   Historical Provider, MD  losartan (COZAAR) 50  MG tablet Take 1 tablet by mouth daily. 02/15/14   Historical Provider, MD  metoCLOPramide (REGLAN) 10 MG tablet Take 1-2 tablets (10-20 mg total) by mouth every 6 (six) hours as needed for nausea or vomiting. 05/29/15   Joycie PeekBenjamin Alyn Jurney, PA-C  ondansetron (ZOFRAN) 4 MG tablet Take 1 tablet (4 mg total) by mouth every 6 (six) hours. 05/24/15   Antony MaduraKelly Humes, PA-C  Oxycodone HCl 10 MG TABS Take 1 tablet by mouth every 6 (six) hours as needed. 04/17/14   Historical Provider, MD  polyethylene glycol (MIRALAX / GLYCOLAX) packet Take 17 g by mouth daily.    Historical Provider, MD  potassium chloride SA (K-DUR,KLOR-CON) 20 MEQ tablet Take 20 mEq by mouth daily.    Historical Provider, MD  pregabalin (LYRICA) 150 MG capsule Take 150 mg by mouth 3 (three) times daily.     Historical Provider, MD   BP 215/118 mmHg  Pulse 81  Temp(Src) 98.2 F (36.8 C) (Oral)  Resp 11  Ht 5\' 11"  (1.803 m)  Wt 146.058 kg  BMI 44.93 kg/m2  SpO2 95% Physical Exam  Constitutional: He is oriented to person, place, and time. He appears well-developed and well-nourished.  Morbidly obese African-American male. Patient is moaning and gagging with spit up, however no emesis.  HENT:  Head: Normocephalic and atraumatic.  Mouth/Throat: Oropharynx is clear and moist.  Eyes: Conjunctivae are normal. Pupils are equal, round, and reactive to light. Right eye exhibits no discharge. Left eye exhibits no discharge. No scleral icterus.  Neck: Neck supple.  Cardiovascular: Normal rate, regular rhythm and normal heart sounds.   Pulmonary/Chest: Effort normal and breath sounds normal. No respiratory distress. He has no wheezes. He has no rales.  Abdominal: Soft. There is no tenderness.  Large panniculus. Diffuse tenderness. No peritoneal signs.  Musculoskeletal: He exhibits no tenderness.  Neurological: He is alert and oriented to person, place, and time.  Cranial Nerves II-XII grossly intact  Skin: Skin is warm and dry. No rash noted.  Psychiatric: He has a normal mood and affect.  Nursing note and vitals reviewed.   ED Course  Procedures (including critical care time) Labs Review Labs Reviewed  LIPASE, BLOOD - Abnormal; Notable for the following:    Lipase 53 (*)    All other components within normal limits  COMPREHENSIVE METABOLIC PANEL - Abnormal; Notable for the following:    Potassium 3.1 (*)    Glucose, Bld 278 (*)    Creatinine, Ser 1.44 (*)    Total Protein 5.8 (*)    Albumin 2.3 (*)    AST 14 (*)    GFR calc non Af Amer 57 (*)    All other components within normal limits  URINE RAPID DRUG SCREEN, HOSP PERFORMED  CBC    Imaging Review No results found. I have personally reviewed and evaluated these images and lab results as part of my medical decision-making.    EKG Interpretation   Date/Time:  Wednesday May 29 2015 04:29:42 EST Ventricular Rate:  82 PR Interval:  162 QRS Duration: 99 QT Interval:  397 QTC Calculation: 464 R Axis:   58 Text Interpretation:  Sinus rhythm Borderline T abnormalities, diffuse  leads Confirmed by [facility] End[facility] Center LLCALUMBO-RASCH  MD, APRIL ([id number]) on 05/29/2015 5:36:50  AM     Meds given in ED:  Medications  dicyclomine (BENTYL) injection 20 mg (20 mg Intramuscular Given 05/29/15 0542)  metoCLOPramide (REGLAN) injection 10 mg (10 mg Intravenous Given 05/29/15 0543)  ketorolac (TORADOL) 30 MG/ML injection 30  mg (30 mg Intravenous Given 05/29/15 0548)  haloperidol (HALDOL) tablet 3 mg (3 mg Oral Given 05/29/15 0642)    New Prescriptions   No medications on file   Filed Vitals:   05/29/15 0430 05/29/15 0436 05/29/15 0500 05/29/15 0530  BP: 194/99 194/99 185/104 215/118  Pulse:  84 80 81  Temp:  98.2 F (36.8 C)    TempSrc:  Oral    Resp: Height:  (1.803 m)     Weight: 146.058 kg     SpO2:  95% 96% 95%    MDM  CORDEL DREWES is a 44 y.o. male with a history of morbid obesity, insulin-dependent diabetes with gastroparesis and chronic abdominal pain comes in for evaluation of acute on chronic abdominal pain. Patient does exhibit some drug-seeking behavior and is repeatedly requesting morphine. His labs are all reassuring. He has not had any overt emesis in the ED, more so just spitting up, moaning and yelling loudly. He was given Reglan, Bentyl and Toradol and upon reevaluation he appeared to be more comfortable. However, he states that he is still in pain and is queasy. Discussed narcotic pain medicine is not indicated for his gastroparesis and chronic abdominal pain. Will give oral Haldol. EKG without any QTC prolongation. Patient is currently hypertensive, but this appears to be normal for him. Otherwise, he is afebrile and is hemodynamically stable. Patient care signed out to oncoming provider,  Noelle Penner PA-C, for follow-up on blood pressure after administration of medication. Provided no new objective findings, I think the patient may be discharged home to follow up with his PCP for further evaluation and management of his symptoms. We'll give prescription for Reglan and Bentyl to help with symptoms. Prior to sign out, discussed and reviewed this case with my attending, Dr. Nicanor Alcon who agrees with the plan. Final diagnoses:  Chronic generalized abdominal pain        Joycie Peek, PA-C 05/29/15 1610  April Palumbo, MD 05/29/15 [phone]

## 2015-05-29 NOTE — ED Notes (Addendum)
Pt brought to ED by [facility] from home for 10/10 abd pain, nausea, vomiting since yesterday at 11 am. Pt took one oxycodone 10 mg at home and insulin 36 units at 1 am.

## 2015-05-29 NOTE — Discharge Instructions (Signed)
There does not appear to be an emergent cause for your symptoms at this time. Your labs are all very reassuring. Please take your medications as prescribed to help with her discomfort. Follow-up with her doctor for further evaluation and management of your symptoms. Return to ED for any new or worsening symptoms.  Abdominal Pain, Adult Many things can cause abdominal pain. Usually, abdominal pain is not caused by a disease and will improve without treatment. It can often be observed and treated at home. Your health care provider will do a physical exam and possibly order blood tests and X-rays to help determine the seriousness of your pain. However, in many cases, more time must pass before a clear cause of the pain can be found. Before that point, your health care provider may not know if you need more testing or further treatment. HOME CARE INSTRUCTIONS Monitor your abdominal pain for any changes. The following actions may help to alleviate any discomfort you are experiencing:  Only take over-the-counter or prescription medicines as directed by your health care provider.  Do not take laxatives unless directed to do so by your health care provider.  Try a clear liquid diet (broth, tea, or water) as directed by your health care provider. Slowly move to a bland diet as tolerated. SEEK MEDICAL CARE IF:  You have unexplained abdominal pain.  You have abdominal pain associated with nausea or diarrhea.  You have pain when you urinate or have a bowel movement.  You experience abdominal pain that wakes you in the night.  You have abdominal pain that is worsened or improved by eating food.  You have abdominal pain that is worsened with eating fatty foods.  You have a fever. SEEK IMMEDIATE MEDICAL CARE IF:  Your pain does not go away within 2 hours.  You keep throwing up (vomiting).  Your pain is felt only in portions of the abdomen, such as the right side or the left lower portion of the  abdomen.  You pass bloody or black tarry stools. MAKE SURE YOU:  Understand these instructions.  Will watch your condition.  Will get help right away if you are not doing well or get worse.   This information is not intended to replace advice given to you by your health care provider. Make sure you discuss any questions you have with your health care provider.   Document Released: 03/04/2005 Document Revised: 02/13/2015 Document Reviewed: 02/01/2013 Elsevier Interactive Patient Education [employer]2016 [employer]

## 2015-05-29 NOTE — ED Notes (Signed)
Pt sleeping, lying on his side. PA to bedside to assess patient. Reports he can be discharged. Pt has family member at bedside.

## 2015-05-29 NOTE — ED Provider Notes (Signed)
Sign out received from PA-C B. Cartner. Pt is an 44 y.o. male with chronic abdominal pain here with abdominal pain consistent with his usual pain. Workup has been unremarkable. Pt given toradol, bentyl, and reglan with some improvement. Most recently given Haldol PO. Of note, pt's BP has been elevated in the ED. He does have chronically elevated BP on eval of EMR. Will re-assess pt after haldol has had some time to take effect. Anticipate d/c home with relief of abdominal pain and improvement in BP. Will give rx for bentyl and reglan, will encourage PCP f/u for chronic pain, hypertension, and other co-morbidities.  BP down to 175/90. Pain improved though not completely resolved. He is tolerating PO. Pt requesting dilaudid. I discussed with pt, as did previous provider, that we will not be giving narcotics today. He must follow-up with PCP as above. VSS. Pt is stable for discharge. ER return precautions given.   Filed Vitals:   05/29/15 0436 05/29/15 0500 05/29/15 0530 05/29/15 0600  BP: 194/99 185/104 215/118 175/90  Pulse: 84 80 81 88  Temp: 98.2 F (36.8 C)     TempSrc: Oral     Resp: 13 20 11 17   Height:      Weight:      SpO2: 95% 96% 95% 93%     Kenneth BarrowSerena Y Ryanne Morand, PA-C 05/29/15 [id number]  April Palumbo, MD 05/29/15 [phone]

## 2015-06-01 ENCOUNTER — Inpatient Hospital Stay (HOSPITAL_COMMUNITY)
Admission: EM | Admit: 2015-06-01 | Discharge: 2015-06-06 | DRG: 683 | Disposition: A | Discharge status: Home Health | Payer: Medicaid Other | Attending: Internal Medicine | Admitting: Internal Medicine

## 2015-06-01 ENCOUNTER — Inpatient Hospital Stay (HOSPITAL_COMMUNITY): Payer: Medicaid Other

## 2015-06-01 ENCOUNTER — Emergency Department (HOSPITAL_COMMUNITY): Payer: Medicaid Other

## 2015-06-01 ENCOUNTER — Encounter (HOSPITAL_COMMUNITY): Payer: Self-pay | Admitting: Emergency Medicine

## 2015-06-01 DIAGNOSIS — K3184 Gastroparesis: Secondary | ICD-10-CM | POA: Diagnosis present

## 2015-06-01 DIAGNOSIS — R0902 Hypoxemia: Secondary | ICD-10-CM | POA: Diagnosis present

## 2015-06-01 DIAGNOSIS — J9601 Acute respiratory failure with hypoxia: Secondary | ICD-10-CM | POA: Diagnosis not present

## 2015-06-01 DIAGNOSIS — N179 Acute kidney failure, unspecified: Secondary | ICD-10-CM | POA: Diagnosis not present

## 2015-06-01 DIAGNOSIS — Z841 Family history of disorders of kidney and ureter: Secondary | ICD-10-CM | POA: Diagnosis not present

## 2015-06-01 DIAGNOSIS — R0602 Shortness of breath: Secondary | ICD-10-CM

## 2015-06-01 DIAGNOSIS — E1142 Type 2 diabetes mellitus with diabetic polyneuropathy: Secondary | ICD-10-CM

## 2015-06-01 DIAGNOSIS — J449 Chronic obstructive pulmonary disease, unspecified: Secondary | ICD-10-CM | POA: Diagnosis present

## 2015-06-01 DIAGNOSIS — Z6841 Body Mass Index (BMI) 40.0 and over, adult: Secondary | ICD-10-CM

## 2015-06-01 DIAGNOSIS — I10 Essential (primary) hypertension: Secondary | ICD-10-CM

## 2015-06-01 DIAGNOSIS — R7989 Other specified abnormal findings of blood chemistry: Secondary | ICD-10-CM

## 2015-06-01 DIAGNOSIS — Z9989 Dependence on other enabling machines and devices: Secondary | ICD-10-CM

## 2015-06-01 DIAGNOSIS — Z833 Family history of diabetes mellitus: Secondary | ICD-10-CM

## 2015-06-01 DIAGNOSIS — D573 Sickle-cell trait: Secondary | ICD-10-CM | POA: Diagnosis present

## 2015-06-01 DIAGNOSIS — E1140 Type 2 diabetes mellitus with diabetic neuropathy, unspecified: Secondary | ICD-10-CM | POA: Diagnosis not present

## 2015-06-01 DIAGNOSIS — E1143 Type 2 diabetes mellitus with diabetic autonomic (poly)neuropathy: Secondary | ICD-10-CM

## 2015-06-01 DIAGNOSIS — E876 Hypokalemia: Secondary | ICD-10-CM | POA: Diagnosis present

## 2015-06-01 DIAGNOSIS — J80 Acute respiratory distress syndrome: Secondary | ICD-10-CM | POA: Diagnosis not present

## 2015-06-01 DIAGNOSIS — Z794 Long term (current) use of insulin: Secondary | ICD-10-CM | POA: Diagnosis not present

## 2015-06-01 DIAGNOSIS — N189 Chronic kidney disease, unspecified: Secondary | ICD-10-CM | POA: Diagnosis present

## 2015-06-01 DIAGNOSIS — E1121 Type 2 diabetes mellitus with diabetic nephropathy: Secondary | ICD-10-CM | POA: Diagnosis present

## 2015-06-01 DIAGNOSIS — E1122 Type 2 diabetes mellitus with diabetic chronic kidney disease: Secondary | ICD-10-CM | POA: Diagnosis present

## 2015-06-01 DIAGNOSIS — I248 Other forms of acute ischemic heart disease: Secondary | ICD-10-CM | POA: Diagnosis present

## 2015-06-01 DIAGNOSIS — Z79899 Other long term (current) drug therapy: Secondary | ICD-10-CM | POA: Diagnosis not present

## 2015-06-01 DIAGNOSIS — R778 Other specified abnormalities of plasma proteins: Secondary | ICD-10-CM | POA: Diagnosis present

## 2015-06-01 DIAGNOSIS — Z8249 Family history of ischemic heart disease and other diseases of the circulatory system: Secondary | ICD-10-CM | POA: Diagnosis not present

## 2015-06-01 DIAGNOSIS — Z832 Family history of diseases of the blood and blood-forming organs and certain disorders involving the immune mechanism: Secondary | ICD-10-CM | POA: Diagnosis not present

## 2015-06-01 DIAGNOSIS — G8929 Other chronic pain: Secondary | ICD-10-CM | POA: Diagnosis present

## 2015-06-01 DIAGNOSIS — E669 Obesity, unspecified: Secondary | ICD-10-CM

## 2015-06-01 DIAGNOSIS — I272 Other secondary pulmonary hypertension: Secondary | ICD-10-CM | POA: Diagnosis present

## 2015-06-01 DIAGNOSIS — R531 Weakness: Secondary | ICD-10-CM | POA: Diagnosis present

## 2015-06-01 DIAGNOSIS — E1165 Type 2 diabetes mellitus with hyperglycemia: Secondary | ICD-10-CM | POA: Diagnosis present

## 2015-06-01 DIAGNOSIS — Z8711 Personal history of peptic ulcer disease: Secondary | ICD-10-CM

## 2015-06-01 DIAGNOSIS — T465X5A Adverse effect of other antihypertensive drugs, initial encounter: Secondary | ICD-10-CM | POA: Diagnosis present

## 2015-06-01 DIAGNOSIS — E871 Hypo-osmolality and hyponatremia: Secondary | ICD-10-CM | POA: Diagnosis present

## 2015-06-01 DIAGNOSIS — G4733 Obstructive sleep apnea (adult) (pediatric): Secondary | ICD-10-CM | POA: Diagnosis present

## 2015-06-01 DIAGNOSIS — N170 Acute kidney failure with tubular necrosis: Principal | ICD-10-CM | POA: Diagnosis present

## 2015-06-01 DIAGNOSIS — IMO0002 Reserved for concepts with insufficient information to code with codable children: Secondary | ICD-10-CM

## 2015-06-01 DIAGNOSIS — R739 Hyperglycemia, unspecified: Secondary | ICD-10-CM

## 2015-06-01 DIAGNOSIS — D72829 Elevated white blood cell count, unspecified: Secondary | ICD-10-CM | POA: Diagnosis present

## 2015-06-01 DIAGNOSIS — E119 Type 2 diabetes mellitus without complications: Secondary | ICD-10-CM | POA: Diagnosis present

## 2015-06-01 HISTORY — DX: Obstructive sleep apnea (adult) (pediatric): G47.33

## 2015-06-01 HISTORY — DX: Type 2 diabetes mellitus without complications: E11.9

## 2015-06-01 HISTORY — DX: Unspecified asthma, uncomplicated: J45.909

## 2015-06-01 LAB — CBC
HCT: 38.8 % — ABNORMAL LOW (ref 39.0–52.0)
HEMOGLOBIN: 12.7 g/dL — AB (ref 13.0–17.0)
MCH: 28.1 pg (ref 26.0–34.0)
MCHC: 32.7 g/dL (ref 30.0–36.0)
MCV: 85.8 fL (ref 78.0–100.0)
PLATELETS: 225 10*3/uL (ref 150–400)
RBC: 4.52 MIL/uL (ref 4.22–5.81)
RDW: 14.8 % (ref 11.5–15.5)
WBC: 14.1 10*3/uL — ABNORMAL HIGH (ref 4.0–10.5)

## 2015-06-01 LAB — D-DIMER, QUANTITATIVE: D-Dimer, Quant: 2.27 ug/mL-FEU — ABNORMAL HIGH (ref 0.00–0.50)

## 2015-06-01 LAB — PROTIME-INR
INR: 1.07 (ref 0.00–1.49)
PROTHROMBIN TIME: 14.1 s (ref 11.6–15.2)

## 2015-06-01 LAB — BASIC METABOLIC PANEL
ANION GAP: 11 (ref 5–15)
BUN: 32 mg/dL — ABNORMAL HIGH (ref 6–20)
CALCIUM: 7.9 mg/dL — AB (ref 8.9–10.3)
CO2: 29 mmol/L (ref 22–32)
CREATININE: 5.2 mg/dL — AB (ref 0.61–1.24)
Chloride: 94 mmol/L — ABNORMAL LOW (ref 101–111)
GFR calc Af Amer: 14 mL/min — ABNORMAL LOW (ref >60)
GFR, EST NON AFRICAN AMERICAN: 12 mL/min — AB (ref >60)
GLUCOSE: 298 mg/dL — AB (ref 65–99)
Potassium: 3.4 mmol/L — ABNORMAL LOW (ref 3.5–5.1)
Sodium: 134 mmol/L — ABNORMAL LOW (ref 135–145)

## 2015-06-01 LAB — TROPONIN I: TROPONIN I: 0.1 ng/mL — AB (ref <0.031)

## 2015-06-01 LAB — CBG MONITORING, ED
Glucose-Capillary: 306 mg/dL — ABNORMAL HIGH (ref 65–99)
Glucose-Capillary: 209 mg/dL — ABNORMAL HIGH (ref 65–99)

## 2015-06-01 LAB — BRAIN NATRIURETIC PEPTIDE: B NATRIURETIC PEPTIDE 5: 750.7 pg/mL — AB (ref 0.0–100.0)

## 2015-06-01 MED ORDER — HEPARIN (PORCINE) IN NACL 100-0.45 UNIT/ML-% IJ SOLN
2050.0000 [IU]/h | INTRAMUSCULAR | Status: DC
  Administered 2015-06-02: 2050 [IU]/h via INTRAVENOUS
  Filled 2015-06-01 (×2): qty 250

## 2015-06-01 MED ORDER — INSULIN ASPART 100 UNIT/ML ~~LOC~~ SOLN
8.0000 [IU] | Freq: Once | SUBCUTANEOUS | Status: AC
  Administered 2015-06-01: 8 [IU] via SUBCUTANEOUS
  Filled 2015-06-01: qty 1

## 2015-06-01 MED ORDER — SODIUM CHLORIDE 0.9 % IV BOLUS (SEPSIS)
1000.0000 mL | Freq: Once | INTRAVENOUS | Status: AC
  Administered 2015-06-01: 1000 mL via INTRAVENOUS

## 2015-06-01 MED ORDER — HEPARIN BOLUS VIA INFUSION
4000.0000 [IU] | Freq: Once | INTRAVENOUS | Status: AC
  Administered 2015-06-02: 4000 [IU] via INTRAVENOUS
  Filled 2015-06-01: qty 4000

## 2015-06-01 NOTE — ED Notes (Signed)
Bed: WA11 Expected date:  Expected time:  Means of arrival:  Comments: EMS hyperglycemia 

## 2015-06-01 NOTE — ED Provider Notes (Signed)
CSN: [id number]     Arrival date & time 06/01/15  1924 History   First MD Initiated Contact with Patient 06/01/15 1953     Chief Complaint  Patient presents with  . Hyperglycemia   Kenneth Fox is a 44 y.o. male with history of hypertension, obesity, chronic abdominal pain, asthma, chronic bronchitis, diabetes and fibromyalgia who presents to the emergency department complaining of "hyperglycemia and weakness all over." Patient claims a generalized weakness since yesterday. He also feels that his arms and legs are swelling. He reports some slight increased shortness of breath and chest tightness. He reports his history of COPD and uses oxygen only at night. EMS reports that the patient was hypoxic on arrival at 86% on room air. Patient also complains of room spinning dizziness when he stands up and that he cannot walk. The patient denies fevers, chest pain, shortness of breath, coughing, abdominal pain, nausea, vomiting, focal weakness, headache, urinary symptoms, or rashes.   (Consider location/radiation/quality/duration/timing/severity/associated sxs/prior Treatment) HPI  Past Medical History  Diagnosis Date  . Hypertension   . Neuropathy (HCC)   . Anxiety   . Depression   . Sickle cell trait (HCC)   . Gastroenteritis   . Obesity   . Constipation   . High cholesterol   . Asthma   . Chronic bronchitis (HCC)     "get it q yr" (11/25/2013)  . Sleep apnea     "don't use CPAP" (11/25/2013)  . Type II diabetes mellitus (HCC)   . GERD (gastroesophageal reflux disease)   . Stomach ulcer   . Fibromyalgia    Past Surgical History  Procedure Laterality Date  . Hip fracture surgery Right 1999    "put a plate in"   Family History  Problem Relation Age of Onset  . Diabetes Father   . Neuropathy Father   . Hypertension Father   . Sickle cell anemia Mother   . Kidney disease Brother   . Kidney disease Sister   . Aneurysm Father   . Heart attack Mother    Social History   Substance Use Topics  . Smoking status: Never Smoker   . Smokeless tobacco: Never Used  . Alcohol Use: No    Review of Systems  Constitutional: Positive for fatigue. Negative for fever and chills.  HENT: Negative for congestion and sore throat.   Eyes: Negative for visual disturbance.  Respiratory: Positive for chest tightness and shortness of breath. Negative for cough and wheezing.   Cardiovascular: Positive for leg swelling. Negative for chest pain and palpitations.  Gastrointestinal: Negative for nausea, vomiting, abdominal pain and diarrhea.  Genitourinary: Negative for dysuria.  Musculoskeletal: Negative for back pain and neck pain.  Skin: Negative for rash.  Neurological: Positive for dizziness and weakness. Negative for light-headedness and headaches.      Allergies  Lisinopril  Home Medications   Prior to Admission medications   Medication Sig Start Date End Date Taking? Authorizing Provider  dicyclomine (BENTYL) 20 MG tablet Take 1 tablet (20 mg total) by mouth 2 (two) times daily. 05/29/15  Yes Joycie Peek, PA-C  hydrALAZINE (APRESOLINE) 50 MG tablet Take 50 mg by mouth 3 (three) times daily.   Yes Historical Provider, MD  insulin aspart (NOVOLOG) 100 UNIT/ML injection Inject 10-20 Units into the skin 3 (three) times daily with meals. per sliding scale CBG 70 - 120: 0 units CBG 121 - 150: 2 units CBG 151 - 200: 3 units CBG 201 - 250: 5 units CBG 251 -  300: 8 units CBG 301 - 350: 11 units CBG 351 - 400: 15 units 11/26/13  Yes Shanker Levora Dredge, MD  JANUMET XR [phone] MG TB24 Take 1 tablet by mouth daily. 02/15/14  Yes Historical Provider, MD  LEVEMIR FLEXTOUCH 100 UNIT/ML Pen Inject 80 Units into the skin at bedtime.  05/27/15  Yes Historical Provider, MD  losartan (COZAAR) 50 MG tablet Take 50 mg by mouth daily. 05/24/15  Yes Historical Provider, MD  LYRICA 100 MG capsule Take 100 mg by mouth 3 (three) times daily. 05/02/15  Yes Historical Provider, MD   metoCLOPramide (REGLAN) 10 MG tablet Take 1-2 tablets (10-20 mg total) by mouth every 6 (six) hours as needed for nausea or vomiting. 05/29/15  Yes Joycie Peek, PA-C  Oxycodone HCl 10 MG TABS Take 1 tablet by mouth every 6 (six) hours as needed (pain).  04/17/14  Yes Historical Provider, MD  polyethylene glycol (MIRALAX / GLYCOLAX) packet Take 17 g by mouth daily as needed for mild constipation.    Yes Historical Provider, MD   BP 134/91 mmHg  Pulse 78  Temp(Src) 99.5 F (37.5 C) (Oral)  Resp 15  SpO2 95% Physical Exam  Constitutional: He is oriented to person, place, and time. He appears well-developed and well-nourished. No distress.  Nontoxic appearing. Obese male.  HENT:  Head: Normocephalic and atraumatic.  Mouth/Throat: Oropharynx is clear and moist.  Eyes: Conjunctivae are normal. Pupils are equal, round, and reactive to light. Right eye exhibits no discharge. Left eye exhibits no discharge.  Neck: Normal range of motion. Neck supple. No JVD present.  Cardiovascular: Normal rate, regular rhythm, normal heart sounds and intact distal pulses.  Exam reveals no gallop and no friction rub.   No murmur heard. Good capillary refill.  Pulmonary/Chest: Effort normal. No respiratory distress. He has no wheezes. He has no rales.  Distant crackles noted bilaterally. Oxygen saturation 86% on room air. This improved to 97% on 2 L via nasal cannula. No increased work of breathing. No rales or rhonchi noted. No wheezes noted.  Abdominal: Soft. Bowel sounds are normal. There is no tenderness. There is no guarding.  Musculoskeletal: He exhibits edema. He exhibits no tenderness.  Moderate bilateral lower extremity edema. No calf tenderness to palpation. Patient has 5 out of 5 strength in his bilateral upper lobe Shirley's. He has good strength with plantar and dorsiflexion of his bilateral feet.  Lymphadenopathy:    He has no cervical adenopathy.  Neurological: He is alert and oriented to  person, place, and time. No cranial nerve deficit. Coordination normal.  The patient is alert and oriented 3. Cranial nerves are intact. Sensation is intact his bilateral upper and lower extremities. He has 5 out of 5 strength in his bilateral upper and lower extremities. No focal weakness. Finger-to-nose intact bilaterally. No pronator drift. Speech is clear and coherent.  Skin: Skin is warm and dry. No rash noted. He is not diaphoretic. No erythema. No pallor.  Psychiatric: He has a normal mood and affect. His behavior is normal.  Nursing note and vitals reviewed.   ED Course  Procedures (including critical care time) Labs Review Labs Reviewed  BASIC METABOLIC PANEL - Abnormal; Notable for the following:    Sodium 134 (*)    Potassium 3.4 (*)    Chloride 94 (*)    Glucose, Bld 298 (*)    BUN 32 (*)    Creatinine, Ser 5.20 (*)    Calcium 7.9 (*)    GFR calc  non Af Amer 12 (*)    GFR calc Af Amer 14 (*)    All other components within normal limits  CBC - Abnormal; Notable for the following:    WBC 14.1 (*)    Hemoglobin 12.7 (*)    HCT 38.8 (*)    All other components within normal limits  BRAIN NATRIURETIC PEPTIDE - Abnormal; Notable for the following:    B Natriuretic Peptide 750.7 (*)    All other components within normal limits  TROPONIN I - Abnormal; Notable for the following:    Troponin I 0.10 (*)    All other components within normal limits  D-DIMER, QUANTITATIVE (NOT AT [facility]) - Abnormal; Notable for the following:    D-Dimer, Quant 2.27 (*)    All other components within normal limits  CBG MONITORING, ED - Abnormal; Notable for the following:    Glucose-Capillary 306 (*)    All other components within normal limits  CBG MONITORING, ED - Abnormal; Notable for the following:    Glucose-Capillary 209 (*)    All other components within normal limits  MRSA PCR SCREENING  PROTIME-INR  URINALYSIS, ROUTINE W REFLEX MICROSCOPIC (NOT AT [facility])  CREATININE, URINE, RANDOM   SODIUM, URINE, RANDOM  TROPONIN I  TROPONIN I  CBC  HEPARIN LEVEL (UNFRACTIONATED)  TROPONIN I    Imaging Review Dg Chest 2 View  06/01/2015  CLINICAL DATA:  44 year old male with shortness of breath, weakness, and hypoxia EXAM: CHEST  2 VIEW COMPARISON:  Radiograph dated 01/17/2015 FINDINGS: Two views of the chest demonstrate linear atelectasis/scarring involving the right lower lung field. No focal consolidation, pleural effusion, or pneumothorax. Stable cardiomegaly. The osseous structures appear unremarkable. IMPRESSION: No active cardiopulmonary disease. Electronically Signed   By: Elgie CollardArash  Radparvar M.D.   On: 06/01/2015 20:57   [place] Renal  06/02/2015  CLINICAL DATA:  Acute onset of renal failure.  Initial encounter. EXAM: RENAL / URINARY TRACT ULTRASOUND COMPLETE COMPARISON:  CT of the abdomen and pelvis from 03/10/2014 FINDINGS: Right Kidney: Length: 11.6 cm. Echogenicity within normal limits. No mass or hydronephrosis visualized. Left Kidney: Length: 11.2 cm. Echogenicity within normal limits. No mass or hydronephrosis visualized. Bladder: Not visualized on this study. Evaluation is suboptimal due to the patient's habitus. IMPRESSION: Unremarkable renal ultrasound. No evidence for hydronephrosis. Evaluation suboptimal due to the patient's habitus; the bladder is not visualized. Electronically Signed   By: Roanna RaiderJeffery  Chang M.D.   On: 06/02/2015 00:03   I have personally reviewed and evaluated these images and lab results as part of my medical decision-making.   EKG Interpretation   Date/Time:  Saturday June 01 2015 20:32:39 EST Ventricular Rate:  80 PR Interval:  134 QRS Duration: 95 QT Interval:  400 QTC Calculation: 461 R Axis:   45 Text Interpretation:  Sinus rhythm Nonspecific T abnormalities, inferior  leads No significant change since last tracing Confirmed by [facility]  MD,  MARTHA [phone]([id number]) on 06/01/2015 9:39:34 PM      Filed Vitals:   06/01/15 2150 06/01/15 2200  06/01/15 2300 06/01/15 2330  BP: 120/80 132/93 101/89 134/91  Pulse: 80 80 78 78  Temp:      TempSrc:      Resp: 18 16 13 15   SpO2: 97% 97% 96% 95%     MDM   Meds given in ED:  Medications  heparin ADULT infusion 100 units/mL (25000 units/250 mL) (2,050 Units/hr Intravenous New Bag/Given 06/02/15 0003)  Influenza vac split quadrivalent PF (FLUARIX) injection 0.5 mL (not  administered)  insulin aspart (novoLOG) injection 8 Units (8 Units Subcutaneous Given 06/01/15 2028)  sodium chloride 0.9 % bolus 1,000 mL (0 mLs Intravenous Stopped 06/01/15 2358)  heparin bolus via infusion 4,000 Units (4,000 Units Intravenous Given 06/02/15 0003)    Current Discharge Medication List      Final diagnoses:  SOB (shortness of breath)  Dx: #1: Acute kidney injury #2: SOB #3: Hyperglycemia #4: Hypoxia #5: Elevated D-Dimer   This is a 44 y.o. male with history of hypertension, obesity, chronic abdominal pain, asthma, chronic bronchitis, diabetes and fibromyalgia who presents to the emergency department complaining of "hyperglycemia and weakness all over." Patient claims a generalized weakness since yesterday. He also feels that his arms and legs are swelling. He reports some slight increased shortness of breath and chest tightness. He reports his history of COPD and uses oxygen only at night. EMS reports that the patient was hypoxic on arrival at 86% on room air.  On exam the patient is afebrile nontoxic appearing. The patient has no focal neurological deficits. He has no focal weakness. Patient saw oxygen saturation does drop to 86% on room air. Patient's oxygen saturations improved to 97% with 2 L via nasal cannula. He is not tachypneic or tachycardic. Lungs have distant crackles noted bilaterally.  Patient CBC indicates no leukocytosis of white count of 14,000. BMP shows acute kidney injury with a creatinine of 5.20. Patient's d-dimer is elevated at 2.27 with an elevated troponin at 0.10. EKG shows  no signs of STEMI. Patient denies any chest pain. He denies any shortness of breath. Chest x-ray is clear. Glucose is 298 with a normal anion gap. Will provide with fluid bolus and will get VQ scan due to the patient's elevated creatinine. Will consult for admission. Patient was accepted for admission by Dr. Maryfrances Bunnell. Stepdown temporary admission orders placed. Patient is in agreement with admission. Will get V/Q scan tonight after admission when staff arrives to do this.   This patient was discussed with Dr. Karma Ganja who agrees with assessment and plan.    Everlene Farrier, PA-C 06/02/15 [id number]  Jerelyn [name], MD 06/04/15 [phone]

## 2015-06-01 NOTE — H&P (Signed)
History and Physical  Patient Name: Kenneth Fox     TFT:[id number]    DOB: [birth year]    DOA: 06/01/2015 Referring physician: Sula Rumple, PA-C PCP: Ledell Noss      Chief Complaint: Leg weakness  HPI: Kenneth Fox is a 44 y.o. male with a past medical history significant for IDDM c/b neuropathy and gastroparesis with chronic abdominal pain, HTN, OSA on CPAP, and morbid obesity who presents with acute leg weakness and dyspnea on exertion.  The patient is a poor historian, but it seems that he came in tonight because of leg weakness, legs "locking up" and several falls in the last few days.  Once he fell in the shower the other day, and today when he woke up, he stood up and his legs locked, he fell, and his brother had to help him up.  When they called EMS later because he wasn't getting better, they found him hypoxic in the high 80s.  In the ED, the patient was found to have an AKI with serum creatinine 5.2 mg/dL, elevated BNP, mildly elevated troponin, and elevated d-dimer.  However, the patient denies chest pain, discomfort, or pleuritic pain.  He denies dyspnea, but when pressed about his exercise capacity, says it is normal for him to only be able to walk about 15 feet before getting out of breath which he for some reason attributes to his neuropathy/feet burning.  He has his chronic leg burning/neuropathy, but no leg aching, redness, or unilateral swelling.  He has chronic leg swelling, which he doesn't think is worse, but he has noticed 10 lbs weight gain over the month and swelling in his hands.    Of note, the patient was in the ER three times last week for abdominal pain and vomiting, which was thought to be due to his known gastroparesis.  This was relieved with narcotics.  He was noted to have serum creatinine 1.4 (his baseline is 1.0-1.1).  During his last ER visit three days ago, the patient was given ketorolac for abdominal pain.  He has had no recent contrast and not  taking NSAIDs at home.  Two of his brothers are on HD.  One had a spontaneous SDH while on warfarin.    Review of Systems:  Pt complains of leg weakness, leg stiffness, weakness, chronic leg swelling, hand swelling that is new, weight gain. Pt denies any oliguria (although he has not made urine here), hematuria, orthopnea, PND, worsening leg swelling, cough, chest discomfort.  All other systems negative except as just noted or noted in the history of present illness.  Allergies  Allergen Reactions  . Lisinopril Other (See Comments)    Patient developed AKI after being on lisinopril for a week.    Prior to Admission medications   Medication Sig Start Date End Date Taking? Authorizing Provider  dicyclomine (BENTYL) 20 MG tablet Take 1 tablet (20 mg total) by mouth 2 (two) times daily. 05/29/15  Yes Comer Locket, PA-C  hydrALAZINE (APRESOLINE) 50 MG tablet Take 50 mg by mouth 3 (three) times daily.   Yes Historical Provider, MD  insulin aspart (NOVOLOG) 100 UNIT/ML injection Inject 10-20 Units into the skin 3 (three) times daily with meals. per sliding scale CBG 70 - 120: 0 units CBG 121 - 150: 2 units CBG 151 - 200: 3 units CBG 201 - 250: 5 units CBG 251 - 300: 8 units CBG 301 - 350: 11 units CBG 351 - 400: 15 units 11/26/13  Yes Shanker  Kristeen Mans, MD  JANUMET XR [phone] MG TB24 Take 1 tablet by mouth daily. 02/15/14  Yes Historical Provider, MD  LEVEMIR FLEXTOUCH 100 UNIT/ML Pen Inject 80 Units into the skin at bedtime.  05/27/15  Yes Historical Provider, MD  losartan (COZAAR) 50 MG tablet Take 50 mg by mouth daily. 05/24/15  Yes Historical Provider, MD  LYRICA 100 MG capsule Take 100 mg by mouth 3 (three) times daily. 05/02/15  Yes Historical Provider, MD  metoCLOPramide (REGLAN) 10 MG tablet Take 1-2 tablets (10-20 mg total) by mouth every 6 (six) hours as needed for nausea or vomiting. 05/29/15  Yes Comer Locket, PA-C  Oxycodone HCl 10 MG TABS Take 1 tablet by mouth every 6  (six) hours as needed (pain).  04/17/14  Yes Historical Provider, MD  polyethylene glycol (MIRALAX / GLYCOLAX) packet Take 17 g by mouth daily as needed for mild constipation.    Yes Historical Provider, MD  ondansetron (ZOFRAN) 4 MG tablet Take 1 tablet (4 mg total) by mouth every 6 (six) hours. Patient not taking: Reported on 06/01/2015 05/24/15   Antonietta Breach, PA-C    Past Medical History  Diagnosis Date  . Hypertension   . Neuropathy ([place])   . Anxiety   . Depression   . Sickle cell trait ([place])   . Gastroenteritis   . Obesity   . Constipation   . High cholesterol   . Asthma   . Chronic bronchitis ([place])     "get it q yr" (11/25/2013)  . Sleep apnea     "don't use CPAP" (11/25/2013)  . Type II diabetes mellitus ([place])   . GERD (gastroesophageal reflux disease)   . Stomach ulcer   . Fibromyalgia     Past Surgical History  Procedure Laterality Date  . Hip fracture surgery Right 1999    "put a plate in"    Family history: family history includes Aneurysm in his father; Diabetes in his father; Heart attack in his mother; Hypertension in his father; Kidney disease in his brother and sister; Neuropathy in his father; Sickle cell anemia in his mother.    Social History: Patient lives with his brother.  He is married and has several children, some of whom live in [place].  He does not smoke.  He denies illicit drugs.       Physical Exam: BP 132/93 mmHg  Pulse 80  Temp(Src) 99.5 F (37.5 C) (Oral)  Resp 16  SpO2 97% General appearance: Morbidly obese adult male, alert and in no acute distress.   Eyes: Anicteric, conjunctiva pink, lids and lashes normal.     ENT: No nasal deformity, discharge, or epistaxis.  OP moist without lesions.  On nasal cannula.  Mallampati 4.   Lymph: Lymphadenopathy cannot be appreciated due to body habitus. Skin: Warm and dry.  Hands a little swollen.  No red and swollen leg.  Homan's negative.  Calves nontender. Cardiac: RRR, nl S1-S2, no murmurs  appreciated.  Capillary refill is brisk.  JVP not visible.  Minimal pretibial edema.   Respiratory: Normal respiratory rate and rhythm.  CTAB without rales or wheezes, but exam limited by body habitus. Abdomen: Abdomen soft without rigidity.  No TTP.  Neuro: Sensorium intact and responding to questions, attention normal.  Speech is fluent.  Moves all extremities equally and with normal coordination.    Psych: Behavior appropriate.  Affect normal.  No evidence of aural or visual hallucinations or delusions.       Labs on Admission:  The metabolic panel shows hyponatremia, hypokalemia, AKI with serum creatinine 5.2 mg/dL from 1.4 mg/dL three days ago and 1.1 mg/dL at baseline.  BUN not elevated. AG normal. BNP elevated at 750 D-dimer elevated. TNI elevated at 0.1 ng/ml The complete blood count shows leukocytosis, without anemia or thrombocytopenia.   Radiological Exams on Admission: Personally reviewed: Dg Chest 2 View Poor quality and non-diagnostic.   EKG: Independently reviewed. Rate 80, sinus, without flat Twaves throughout, similar to all previous.  There is new S1Q3T3 strain pattern, which is nonspecific.  QTc 461.    Assessment/Plan 1. AKI:  This is new.  The patient's baseline eGFR is >60, but he has a history of AKI in the past and two brothers with renal disease, including at least one with ESRD on HD.  Presumably his predisposing factor is HTN and DM, but he was also given Toradol three days ago, which may have contributed. -Urinalysis for sediment -Urine electrolytes -US renal -Has already been given fluid challenge, will trend BMP -Hold nephrotoxins -Strict I/Os    2. Hypoxia:  This is new.  The differential includes pulmonary edema from renal failure or CHF, PE.  He has no cough to suggest pneumonia, so this is not favored.  Given his d-dimer and elevated troponin, he and I discussed risks and benefits of empiric heparin which I believe outweigh risks (including  intracranial bleeding) and will start heparin while we wait on VQ scan.  Currently hemodynamically stable. -STAT VQ scan -Heparin gtt -Admit to stepdown for now -Echocardiogram  3. Elevated troponin: Suspect this is in the setting of fluid overload from AKI and poor clearance, but it is not clear.  ACS and injury from PE are within the differential, but doubted. -Cycle troponins -Consult to Cardiology -Heparin gtt -Echocardiogram  4. IDDM:  Hyperglycemic at admission. -Hold home insulins and metformin given renal function -Sliding scale corrections spaced out q8hrs given renal function  5. HTN:  Normotensive at admission. -Continue home hydralazine -Hold home losartan -Labetalol PRN if needed for severe HTN  6. OSA:  -CPAP overnight  7. Hypokalemia:  -Check magnesium -Supplement cautiously for now with AKI -Trend BMP  8. Hyponatremia:  Corrects to normal.     DVT PPx: Heparin Diet: Diabetic Consultants: Cardiology Code Status: Full Family Communication: Brothers, present at bedside.  Differential and overnight testing and treatment plan discussed, including anticoagulation with warfarin and potential benefits outweighing potential harms.    Medical decision making: What exists of the patient's previous chart was reviewed in depth and the case was discussed with Will Dansie, PA-C. Patient seen 10:20 PM on 06/01/2015.  Disposition Plan:  Admit to stepdown inpatient for hypoxia unclear cause.  Heparin while PE is ruled out with VQ scan.  If ruled out, and troponins do not rise, would favor stopping heparin and pursuing renal failure workup.  Anticipate >2 days admission.      Edwin Dada [facility] Pager [phone]

## 2015-06-01 NOTE — ED Notes (Signed)
Brought in by EMS from home with c/o hyperglycemia.  Also c/o generalized weakness for the past few days.  CBG was 437 by EMS.  Arrived to ED A/Ox4, with O2 at 2LPM via [place]---- per EMS, pt's O2 sat was 87% on room air at the scene.

## 2015-06-01 NOTE — Progress Notes (Signed)
ANTICOAGULATION CONSULT NOTE - Initial Consult  Pharmacy Consult for Heparin Indication: pulmonary embolus  Allergies  Allergen Reactions  . Lisinopril Other (See Comments)    Patient developed AKI after being on lisinopril for a week.    Patient Measurements:   Heparin Dosing Weight:   Vital Signs: Temp: 99.5 F (37.5 C) (12/24 1930) Temp Source: Oral (12/24 1930) BP: 132/93 mmHg (12/24 2200) Pulse Rate: 80 (12/24 2200)  Labs:  Recent Labs  06/01/15 2011 06/01/15 2019 06/01/15 2024  HGB 12.7*  --   --   HCT 38.8*  --   --   PLT 225  --   --   LABPROT  --   --  14.1  INR  --   --  1.07  CREATININE 5.20*  --   --   TROPONINI  --  0.10*  --     Estimated Creatinine Clearance: 26 mL/min (by C-G formula based on Cr of 5.2).   Medical History: Past Medical History  Diagnosis Date  . Hypertension   . Neuropathy (HCC)   . Anxiety   . Depression   . Sickle cell trait (HCC)   . Gastroenteritis   . Obesity   . Constipation   . High cholesterol   . Asthma   . Chronic bronchitis (HCC)     "get it q yr" (11/25/2013)  . Sleep apnea     "don't use CPAP" (11/25/2013)  . Type II diabetes mellitus (HCC)   . GERD (gastroesophageal reflux disease)   . Stomach ulcer   . Fibromyalgia     Medications:  Infusions:  . heparin      Assessment: Patient from home, in ED with concern for hyperglycemia.  Found to have + D-dimer (2.27)  pharmacy to dose heparin for PE.  Goal of Therapy:  Heparin level 0.3-0.7 units/ml Monitor platelets by anticoagulation protocol: Yes   Plan:  Heparin bolus 4000 units iv x1 Heparin drip at 2050 units/hr Daily CBC Next heparin level at 0900    Darlina GuysGrimsley Jr, Jacquenette ShoneJulian Crowford 06/01/2015,11:06 PM

## 2015-06-02 ENCOUNTER — Inpatient Hospital Stay (HOSPITAL_COMMUNITY): Payer: Medicaid Other

## 2015-06-02 ENCOUNTER — Encounter (HOSPITAL_COMMUNITY): Payer: Self-pay | Admitting: Internal Medicine

## 2015-06-02 DIAGNOSIS — E1140 Type 2 diabetes mellitus with diabetic neuropathy, unspecified: Secondary | ICD-10-CM

## 2015-06-02 DIAGNOSIS — J80 Acute respiratory distress syndrome: Secondary | ICD-10-CM

## 2015-06-02 DIAGNOSIS — E1165 Type 2 diabetes mellitus with hyperglycemia: Secondary | ICD-10-CM

## 2015-06-02 DIAGNOSIS — D72829 Elevated white blood cell count, unspecified: Secondary | ICD-10-CM | POA: Diagnosis present

## 2015-06-02 DIAGNOSIS — E119 Type 2 diabetes mellitus without complications: Secondary | ICD-10-CM | POA: Diagnosis present

## 2015-06-02 DIAGNOSIS — Z794 Long term (current) use of insulin: Secondary | ICD-10-CM

## 2015-06-02 LAB — GLUCOSE, CAPILLARY
GLUCOSE-CAPILLARY: 182 mg/dL — AB (ref 65–99)
GLUCOSE-CAPILLARY: 249 mg/dL — AB (ref 65–99)
GLUCOSE-CAPILLARY: 339 mg/dL — AB (ref 65–99)
Glucose-Capillary: 201 mg/dL — ABNORMAL HIGH (ref 65–99)

## 2015-06-02 LAB — CBC
HEMATOCRIT: 37.7 % — AB (ref 39.0–52.0)
HEMOGLOBIN: 12.2 g/dL — AB (ref 13.0–17.0)
MCH: 27.8 pg (ref 26.0–34.0)
MCHC: 32.4 g/dL (ref 30.0–36.0)
MCV: 85.9 fL (ref 78.0–100.0)
Platelets: 233 10*3/uL (ref 150–400)
RBC: 4.39 MIL/uL (ref 4.22–5.81)
RDW: 14.6 % (ref 11.5–15.5)
WBC: 14.2 10*3/uL — AB (ref 4.0–10.5)

## 2015-06-02 LAB — URINALYSIS, ROUTINE W REFLEX MICROSCOPIC
KETONES UR: NEGATIVE mg/dL
Leukocytes, UA: NEGATIVE
NITRITE: NEGATIVE
PH: 5.5 (ref 5.0–8.0)
Protein, ur: >300 mg/dL — AB
SPECIFIC GRAVITY, URINE: 1.026 (ref 1.005–1.030)

## 2015-06-02 LAB — BASIC METABOLIC PANEL
ANION GAP: 11 (ref 5–15)
BUN: 33 mg/dL — ABNORMAL HIGH (ref 6–20)
CALCIUM: 8.1 mg/dL — AB (ref 8.9–10.3)
CO2: 30 mmol/L (ref 22–32)
Chloride: 96 mmol/L — ABNORMAL LOW (ref 101–111)
Creatinine, Ser: 5.38 mg/dL — ABNORMAL HIGH (ref 0.61–1.24)
GFR, EST AFRICAN AMERICAN: 13 mL/min — AB (ref >60)
GFR, EST NON AFRICAN AMERICAN: 12 mL/min — AB (ref >60)
GLUCOSE: 195 mg/dL — AB (ref 65–99)
POTASSIUM: 3.1 mmol/L — AB (ref 3.5–5.1)
SODIUM: 137 mmol/L (ref 135–145)

## 2015-06-02 LAB — SODIUM, URINE, RANDOM: Sodium, Ur: <10 mmol/L

## 2015-06-02 LAB — TROPONIN I
TROPONIN I: 0.11 ng/mL — AB (ref <0.031)
TROPONIN I: 0.06 ng/mL — AB (ref <0.031)
Troponin I: 0.09 ng/mL — ABNORMAL HIGH (ref <0.031)

## 2015-06-02 LAB — URINE MICROSCOPIC-ADD ON: BACTERIA UA: NONE SEEN

## 2015-06-02 LAB — MRSA PCR SCREENING: MRSA BY PCR: NEGATIVE

## 2015-06-02 LAB — CREATININE, URINE, RANDOM: CREATININE, URINE: 288 mg/dL

## 2015-06-02 MED ORDER — CHLORHEXIDINE GLUCONATE 0.12 % MT SOLN
15.0000 mL | Freq: Two times a day (BID) | OROMUCOSAL | Status: DC
  Administered 2015-06-03 – 2015-06-06 (×7): 15 mL via OROMUCOSAL
  Filled 2015-06-02 (×8): qty 15

## 2015-06-02 MED ORDER — INFLUENZA VAC SPLIT QUAD 0.5 ML IM SUSY
0.5000 mL | PREFILLED_SYRINGE | INTRAMUSCULAR | Status: AC
  Administered 2015-06-03: 0.5 mL via INTRAMUSCULAR
  Filled 2015-06-02 (×3): qty 0.5

## 2015-06-02 MED ORDER — TECHNETIUM TC 99M DIETHYLENETRIAME-PENTAACETIC ACID: 27.7000 | Freq: Once | INTRAVENOUS | Status: DC | PRN

## 2015-06-02 MED ORDER — HYDROMORPHONE HCL 1 MG/ML IJ SOLN: 0.5000 mg | INTRAMUSCULAR | Status: DC | PRN

## 2015-06-02 MED ORDER — CETYLPYRIDINIUM CHLORIDE 0.05 % MT LIQD
7.0000 mL | Freq: Two times a day (BID) | OROMUCOSAL | Status: DC
  Administered 2015-06-03 – 2015-06-05 (×5): 7 mL via OROMUCOSAL

## 2015-06-02 MED ORDER — TECHNETIUM TO 99M ALBUMIN AGGREGATED
4.1000 | Freq: Once | INTRAVENOUS | Status: AC | PRN
  Administered 2015-06-02: 4 via INTRAVENOUS

## 2015-06-02 MED ORDER — ONDANSETRON HCL 4 MG PO TABS: 4.0000 mg | ORAL_TABLET | Freq: Four times a day (QID) | ORAL | Status: DC | PRN

## 2015-06-02 MED ORDER — HYDRALAZINE HCL 50 MG PO TABS
50.0000 mg | ORAL_TABLET | Freq: Three times a day (TID) | ORAL | Status: DC
  Administered 2015-06-02 – 2015-06-06 (×13): 50 mg via ORAL
  Filled 2015-06-02 (×16): qty 1

## 2015-06-02 MED ORDER — SODIUM CHLORIDE 0.9 % IJ SOLN
3.0000 mL | Freq: Two times a day (BID) | INTRAMUSCULAR | Status: DC
  Administered 2015-06-02 – 2015-06-05 (×8): 3 mL via INTRAVENOUS

## 2015-06-02 MED ORDER — POTASSIUM CHLORIDE CRYS ER 20 MEQ PO TBCR
20.0000 meq | EXTENDED_RELEASE_TABLET | Freq: Once | ORAL | Status: AC
Start: 2015-06-02 — End: 2015-06-02
  Administered 2015-06-02: 20 meq via ORAL
  Filled 2015-06-02: qty 1

## 2015-06-02 MED ORDER — OXYCODONE HCL 5 MG PO TABS
10.0000 mg | ORAL_TABLET | Freq: Four times a day (QID) | ORAL | Status: DC | PRN
  Administered 2015-06-04 – 2015-06-06 (×2): 10 mg via ORAL
  Filled 2015-06-02 (×2): qty 2

## 2015-06-02 MED ORDER — ONDANSETRON HCL 4 MG/2ML IJ SOLN: 4.0000 mg | Freq: Four times a day (QID) | INTRAMUSCULAR | Status: DC | PRN

## 2015-06-02 MED ORDER — LABETALOL HCL 100 MG PO TABS
100.0000 mg | ORAL_TABLET | Freq: Two times a day (BID) | ORAL | Status: DC | PRN
  Filled 2015-06-02: qty 1

## 2015-06-02 MED ORDER — METOCLOPRAMIDE HCL 10 MG PO TABS: 10.0000 mg | ORAL_TABLET | Freq: Four times a day (QID) | ORAL | Status: DC | PRN

## 2015-06-02 MED ORDER — POLYETHYLENE GLYCOL 3350 17 G PO PACK: 17.0000 g | PACK | Freq: Every day | ORAL | Status: DC | PRN

## 2015-06-02 MED ORDER — INSULIN ASPART 100 UNIT/ML ~~LOC~~ SOLN
0.0000 [IU] | Freq: Three times a day (TID) | SUBCUTANEOUS | Status: DC
  Administered 2015-06-02: 7 [IU] via SUBCUTANEOUS
  Administered 2015-06-02: 3 [IU] via SUBCUTANEOUS
  Administered 2015-06-03: 5 [IU] via SUBCUTANEOUS

## 2015-06-02 NOTE — Progress Notes (Signed)
Patient was placed on CPAP due to desaturation while asleep; patient has a hx of OSA. O2 and pressure increased to maintain adequate oxygenation. Patient continues to remove CPAP stating he does not always need it, however, O2 saturations dropped to 55-60% while patient was sleeping. RT explained to patient the importance of compliance wearing CPAP while sleeping. Patient acknowledged understanding, but has continued to remove CPAP. Patient is now more awake and eating. No complications noted at this time. RT will continue to monitor patient and encourage him to wear CPAP throughout times of rest.

## 2015-06-02 NOTE — Progress Notes (Signed)
  Echocardiogram 2D Echocardiogram has been performed.  Kenneth Fox 06/02/2015, 11:05 AM

## 2015-06-02 NOTE — Progress Notes (Signed)
RN asked RT to place patient on CPAP due to desaturation when falling asleep. Patient is receiving 2 L supplemental O2 in addition to CPAP. RT will continue to monitor patient.

## 2015-06-02 NOTE — Consult Note (Signed)
[facility] Renal Consultation Note  Requesting MD: Charlies Silvers Indication for Consultation: AKI  HPI:  Kenneth Fox is a 44 y.o. male with PMhx significant for poorly controlled DM with complications, poorly controlled HTN on losartan  (although pt is not sure he was taking), morbid obesity,  OSA on CPAP and chronic abdominal pain and frequent ER visits.  He presented to the ER on 12/24 with leg weakness and DOE- apparently episode of ?syncope the other day? During ER visit on 12/21 creatinine was 1.44 and was 1.1 in August of this year but last night was 5.2- increased to 5.3 today.  There is one BP recorded of 101/89 which could be too low for him, U/A with granular casts U/S is negative for obstruction. UOP only recorded as 250 since admission and that is the reason for consult. He is falling asleep during our conversation- now has a foley with much urine in the bag.  He denies using any NSAIDS and as above does not think he was taking losartan but does admit to dizziness.  Is on a number of meds for pain- lyrica and narcotics. He has a history of AKI due to lisinopril and has 2 first degree relatives that are on dialysis  CREATININE, SER  Date/Time Value Ref Range Status  06/02/2015 06:40 AM 5.38* 0.61 - 1.24 mg/dL Final  06/01/2015 08:11 PM 5.20* 0.61 - 1.24 mg/dL Final  05/29/2015 04:43 AM 1.44* 0.61 - 1.24 mg/dL Final  05/24/2015 07:40 PM 1.47* 0.61 - 1.24 mg/dL Final  05/23/2015 10:07 PM 1.33* 0.61 - 1.24 mg/dL Final  01/18/2015 12:26 AM 1.10 0.61 - 1.24 mg/dL Final  03/14/2014 09:31 PM 1.00 0.50 - 1.35 mg/dL Final  03/13/2014 07:05 PM 0.85 0.50 - 1.35 mg/dL Final  03/11/2014 03:55 AM 0.98 0.50 - 1.35 mg/dL Final  03/10/2014 05:44 AM 1.02 0.50 - 1.35 mg/dL Final  03/09/2014 04:18 PM 0.97 0.50 - 1.35 mg/dL Final  03/09/2014 03:25 AM 0.96 0.50 - 1.35 mg/dL Final  03/08/2014 02:51 PM 1.00 0.50 - 1.35 mg/dL Final  03/08/2014 02:44 PM 0.99 0.50 - 1.35 mg/dL Final   12/05/2013 05:30 AM 1.05 0.50 - 1.35 mg/dL Final  12/04/2013 03:45 AM 2.25* 0.50 - 1.35 mg/dL Final  12/03/2013 12:10 PM 2.90* 0.50 - 1.35 mg/dL Final  12/03/2013 02:43 AM 2.65* 0.50 - 1.35 mg/dL Final  12/03/2013 12:59 AM 2.90* 0.50 - 1.35 mg/dL Final  11/30/2013 11:59 PM 0.95 0.50 - 1.35 mg/dL Final  11/26/2013 12:39 PM 1.03 0.50 - 1.35 mg/dL Final  11/26/2013 04:28 AM 1.14 0.50 - 1.35 mg/dL Final  11/25/2013 06:51 AM 1.09 0.50 - 1.35 mg/dL Final  11/25/2013 04:38 AM 1.12 0.50 - 1.35 mg/dL Final  11/25/2013 03:18 AM 1.13 0.50 - 1.35 mg/dL Final  11/25/2013 12:43 AM 1.15 0.50 - 1.35 mg/dL Final  11/24/2013 06:30 PM 1.26 0.50 - 1.35 mg/dL Final  11/21/2013 07:39 PM 0.94 0.50 - 1.35 mg/dL Final  11/17/2013 05:00 AM 1.17 0.50 - 1.35 mg/dL Final  11/16/2013 06:12 PM 0.92 0.50 - 1.35 mg/dL Final  10/05/2013 04:40 PM 0.93 0.50 - 1.35 mg/dL Final  10/04/2013 04:57 PM 0.80 0.50 - 1.35 mg/dL Final  09/06/2013 07:06 AM 1.30 0.50 - 1.35 mg/dL Final  09/05/2013 01:39 PM 0.96 0.50 - 1.35 mg/dL Final  11/08/2012 04:50 AM 1.11 0.50 - 1.35 mg/dL Final  11/07/2012 06:25 PM 1.07 0.50 - 1.35 mg/dL Final  11/07/2012 09:07 AM 1.20 0.50 - 1.35 mg/dL Final  11/05/2012 05:03 PM 1.35 0.50 -  1.35 mg/dL Final  10/02/2012 12:50 AM 1.20 0.50 - 1.35 mg/dL Final  05/20/2011 12:52 PM 0.88 0.50 - 1.35 mg/dL Final  05/18/2011 05:59 PM 0.81 0.50 - 1.35 mg/dL Final  05/18/2011 03:26 PM 0.85 0.50 - 1.35 mg/dL Final  05/17/2011 10:23 PM 0.86 0.50 - 1.35 mg/dL Final  12/20/2010 10:22 PM 0.95 0.50 - 1.35 mg/dL Final    Comment:    **Please note change in reference range.**  07/01/2010 05:10 AM 1.06 0.4 - 1.5 mg/dL Final  06/30/2010 04:50 PM 1.13 0.4 - 1.5 mg/dL Final  06/29/2010 11:39 AM 1.01 0.4 - 1.5 mg/dL Final  07/12/2009 07:55 PM 0.7 0.4 - 1.5 mg/dL Final  08/23/2008 12:00 PM 1.36 0.4 - 1.5 mg/dL Final  05/29/2007 07:47 AM 1.0  Final     PMHx:   Past Medical History  Diagnosis Date  . Sickle cell trait  [facility])     Past Surgical History  Procedure Laterality Date  . Hip fracture surgery Right 1999    "put a plate in"    Family Hx:  Family History  Problem Relation Age of Onset  . Diabetes Father   . Neuropathy Father   . Hypertension Father   . Sickle cell anemia Mother   . Kidney disease Brother   . Kidney disease Sister   . Aneurysm Father   . Heart attack Mother     Social History:  reports that he has never smoked. He has never used smokeless tobacco. He reports that he does not drink alcohol or use illicit drugs.  Allergies:  Allergies  Allergen Reactions  . Lisinopril Other (See Comments)    Patient developed AKI after being on lisinopril for a week.    Medications: Prior to Admission medications   Medication Sig Start Date End Date Taking? Authorizing Provider  dicyclomine (BENTYL) 20 MG tablet Take 1 tablet (20 mg total) by mouth 2 (two) times daily. 05/29/15  Yes Comer Locket, PA-C  hydrALAZINE (APRESOLINE) 50 MG tablet Take 50 mg by mouth 3 (three) times daily.   Yes Historical Provider, MD  insulin aspart (NOVOLOG) 100 UNIT/ML injection Inject 10-20 Units into the skin 3 (three) times daily with meals. per sliding scale CBG 70 - 120: 0 units CBG 121 - 150: 2 units CBG 151 - 200: 3 units CBG 201 - 250: 5 units CBG 251 - 300: 8 units CBG 301 - 350: 11 units CBG 351 - 400: 15 units 11/26/13  Yes Shanker Kristeen Mans, MD  JANUMET XR [phone] MG TB24 Take 1 tablet by mouth daily. 02/15/14  Yes Historical Provider, MD  LEVEMIR FLEXTOUCH 100 UNIT/ML Pen Inject 80 Units into the skin at bedtime.  05/27/15  Yes Historical Provider, MD  losartan (COZAAR) 50 MG tablet Take 50 mg by mouth daily. 05/24/15  Yes Historical Provider, MD  LYRICA 100 MG capsule Take 100 mg by mouth 3 (three) times daily. 05/02/15  Yes Historical Provider, MD  metoCLOPramide (REGLAN) 10 MG tablet Take 1-2 tablets (10-20 mg total) by mouth every 6 (six) hours as needed for nausea or vomiting.  05/29/15  Yes Comer Locket, PA-C  Oxycodone HCl 10 MG TABS Take 1 tablet by mouth every 6 (six) hours as needed (pain).  04/17/14  Yes Historical Provider, MD  polyethylene glycol (MIRALAX / GLYCOLAX) packet Take 17 g by mouth daily as needed for mild constipation.    Yes Historical Provider, MD    I have reviewed the patient's current medications.  Labs:  Results  for orders placed or performed during the hospital encounter of 06/01/15 (from the past 48 hour(s))  CBG monitoring, ED     Status: Abnormal   Collection Time: 06/01/15  7:36 PM  Result Value Ref Range   Glucose-Capillary 306 (H) 65 - 99 mg/dL   Comment 1 Notify RN   Basic metabolic panel     Status: Abnormal   Collection Time: 06/01/15  8:11 PM  Result Value Ref Range   Sodium 134 (L) 135 - 145 mmol/L   Potassium 3.4 (L) 3.5 - 5.1 mmol/L   Chloride 94 (L) 101 - 111 mmol/L   CO2 29 22 - 32 mmol/L   Glucose, Bld 298 (H) 65 - 99 mg/dL   BUN 32 (H) 6 - 20 mg/dL   Creatinine, Ser 5.20 (H) 0.61 - 1.24 mg/dL   Calcium 7.9 (L) 8.9 - 10.3 mg/dL   GFR calc non Af Amer 12 (L) >60 mL/min   GFR calc Af Amer 14 (L) >60 mL/min    Comment: (NOTE) The eGFR has been calculated using the CKD EPI equation. This calculation has not been validated in all clinical situations. eGFR's persistently <60 mL/min signify possible Chronic Kidney Disease.    Anion gap 11 5 - 15  CBC     Status: Abnormal   Collection Time: 06/01/15  8:11 PM  Result Value Ref Range   WBC 14.1 (H) 4.0 - 10.5 K/uL   RBC 4.52 4.22 - 5.81 MIL/uL   Hemoglobin 12.7 (L) 13.0 - 17.0 g/dL   HCT 38.8 (L) 39.0 - 52.0 %   MCV 85.8 78.0 - 100.0 fL   MCH 28.1 26.0 - 34.0 pg   MCHC 32.7 30.0 - 36.0 g/dL   RDW 14.8 11.5 - 15.5 %   Platelets 225 150 - 400 K/uL  Brain natriuretic peptide     Status: Abnormal   Collection Time: 06/01/15  8:11 PM  Result Value Ref Range   B Natriuretic Peptide 750.7 (H) 0.0 - 100.0 pg/mL  Troponin I     Status: Abnormal   Collection Time:  06/01/15  8:19 PM  Result Value Ref Range   Troponin I 0.10 (H) <0.031 ng/mL    Comment:        PERSISTENTLY INCREASED TROPONIN VALUES IN THE RANGE OF 0.04-0.49 ng/mL CAN BE SEEN IN:       -UNSTABLE ANGINA       -CONGESTIVE HEART FAILURE       -MYOCARDITIS       -CHEST TRAUMA       -ARRYHTHMIAS       -LATE PRESENTING MYOCARDIAL INFARCTION       -COPD   CLINICAL FOLLOW-UP RECOMMENDED.   D-dimer, quantitative     Status: Abnormal   Collection Time: 06/01/15  8:19 PM  Result Value Ref Range   D-Dimer, Quant 2.27 (H) 0.00 - 0.50 ug/mL-FEU    Comment: (NOTE) At the manufacturer cut-off of 0.50 ug/mL FEU, this assay has been documented to exclude PE with a sensitivity and negative predictive value of 97 to 99%.  At this time, this assay has not been approved by the FDA to exclude DVT/VTE. Results should be correlated with clinical presentation.   Protime-INR     Status: None   Collection Time: 06/01/15  8:24 PM  Result Value Ref Range   Prothrombin Time 14.1 11.6 - 15.2 seconds   INR 1.07 0.00 - 1.49  POC CBG, ED     Status: Abnormal  Collection Time: 06/01/15  9:29 PM  Result Value Ref Range   Glucose-Capillary 209 (H) 65 - 99 mg/dL  Troponin I (q 6hr x 3)     Status: Abnormal   Collection Time: 06/02/15  1:10 AM  Result Value Ref Range   Troponin I 0.11 (H) <0.031 ng/mL    Comment:        PERSISTENTLY INCREASED TROPONIN VALUES IN THE RANGE OF 0.04-0.49 ng/mL CAN BE SEEN IN:       -UNSTABLE ANGINA       -CONGESTIVE HEART FAILURE       -MYOCARDITIS       -CHEST TRAUMA       -ARRYHTHMIAS       -LATE PRESENTING MYOCARDIAL INFARCTION       -COPD   CLINICAL FOLLOW-UP RECOMMENDED.   Glucose, capillary     Status: Abnormal   Collection Time: 06/02/15  2:28 AM  Result Value Ref Range   Glucose-Capillary 182 (H) 65 - 99 mg/dL   Comment 1 Notify RN   MRSA PCR Screening     Status: None   Collection Time: 06/02/15  4:00 AM  Result Value Ref Range   MRSA by PCR NEGATIVE  NEGATIVE    Comment:        The GeneXpert MRSA Assay (FDA approved for NASAL specimens only), is one component of a comprehensive MRSA colonization surveillance program. It is not intended to diagnose MRSA infection nor to guide or monitor treatment for MRSA infections.   Urinalysis, Routine w reflex microscopic (not at [facility])     Status: Abnormal   Collection Time: 06/02/15  4:53 AM  Result Value Ref Range   Color, Urine AMBER (A) YELLOW    Comment: BIOCHEMICALS MAY BE AFFECTED BY COLOR   APPearance TURBID (A) CLEAR   Specific Gravity, Urine 1.026 1.005 - 1.030   pH 5.5 5.0 - 8.0   Glucose, UA >1000 (A) NEGATIVE mg/dL   Hgb urine dipstick MODERATE (A) NEGATIVE   Bilirubin Urine SMALL (A) NEGATIVE   Ketones, ur NEGATIVE NEGATIVE mg/dL   Protein, ur >300 (A) NEGATIVE mg/dL   Nitrite NEGATIVE NEGATIVE   Leukocytes, UA NEGATIVE NEGATIVE  Creatinine, urine, random     Status: None   Collection Time: 06/02/15  4:53 AM  Result Value Ref Range   Creatinine, Urine 288 mg/dL    Comment: Performed at [facility]  Sodium, urine, random     Status: None   Collection Time: 06/02/15  4:53 AM  Result Value Ref Range   Sodium, Ur <10 mmol/L    Comment: Performed at [facility]  Urine microscopic-add on     Status: Abnormal   Collection Time: 06/02/15  4:53 AM  Result Value Ref Range   Squamous Epithelial / LPF 6-30 (A) NONE SEEN   WBC, UA 0-5 0 - 5 WBC/hpf   RBC / HPF 0-5 0 - 5 RBC/hpf   Bacteria, UA NONE SEEN NONE SEEN   Casts GRANULAR CAST (A) NEGATIVE   Urine-Other AMORPHOUS URATES/PHOSPHATES     Comment: MUCOUS PRESENT  Troponin I (q 6hr x 3)     Status: Abnormal   Collection Time: 06/02/15  6:40 AM  Result Value Ref Range   Troponin I 0.09 (H) <0.031 ng/mL    Comment:        PERSISTENTLY INCREASED TROPONIN VALUES IN THE RANGE OF 0.04-0.49 ng/mL CAN BE SEEN IN:       -UNSTABLE ANGINA       -  CONGESTIVE HEART FAILURE       -MYOCARDITIS       -CHEST TRAUMA        -ARRYHTHMIAS       -LATE PRESENTING MYOCARDIAL INFARCTION       -COPD   CLINICAL FOLLOW-UP RECOMMENDED.   CBC     Status: Abnormal   Collection Time: 06/02/15  6:40 AM  Result Value Ref Range   WBC 14.2 (H) 4.0 - 10.5 K/uL   RBC 4.39 4.22 - 5.81 MIL/uL   Hemoglobin 12.2 (L) 13.0 - 17.0 g/dL   HCT 37.7 (L) 39.0 - 52.0 %   MCV 85.9 78.0 - 100.0 fL   MCH 27.8 26.0 - 34.0 pg   MCHC 32.4 30.0 - 36.0 g/dL   RDW 14.6 11.5 - 15.5 %   Platelets 233 150 - 400 K/uL  Basic metabolic panel     Status: Abnormal   Collection Time: 06/02/15  6:40 AM  Result Value Ref Range   Sodium 137 135 - 145 mmol/L   Potassium 3.1 (L) 3.5 - 5.1 mmol/L   Chloride 96 (L) 101 - 111 mmol/L   CO2 30 22 - 32 mmol/L   Glucose, Bld 195 (H) 65 - 99 mg/dL   BUN 33 (H) 6 - 20 mg/dL   Creatinine, Ser 5.38 (H) 0.61 - 1.24 mg/dL   Calcium 8.1 (L) 8.9 - 10.3 mg/dL   GFR calc non Af Amer 12 (L) >60 mL/min   GFR calc Af Amer 13 (L) >60 mL/min    Comment: (NOTE) The eGFR has been calculated using the CKD EPI equation. This calculation has not been validated in all clinical situations. eGFR's persistently <60 mL/min signify possible Chronic Kidney Disease.    Anion gap 11 5 - 15  Glucose, capillary     Status: Abnormal   Collection Time: 06/02/15  7:37 AM  Result Value Ref Range   Glucose-Capillary 201 (H) 65 - 99 mg/dL   Comment 1 Notify RN    Comment 2 Document in Chart      ROS:  A comprehensive review of systems was negative except for: what he describes as leg weakness- feeling unsteady  Physical Exam: Filed Vitals:   06/02/15 1100 06/02/15 1200  BP:    Pulse: 79 85  Temp:    Resp: 17 20     General: obese, young, falling asleep/snoring HEENT: PERRLA, EOMI, mm moist Neck: difficult to tell JVD due to neck size Heart: RRR Lungs: poor effort- mostly clear Abdomen: obese, soft  Extremities: some peripheral edema Skin: warm and dry Neuro: somnolent bur arousable  Assessment/Plan:44 year old  BM with DM/HTN, chronic abdominal pain on narcotics and lyrica now presenting with AKI 1.Renal- AKI in the setting of relative hypotension- granular casts on U/A- likely just due to ATN/hypotension in a susceptible pt with  at least the start of diabetic nephropathy at baseline.- unclear if he was taking losartan but can exacerbate this issue.  He also probably was developing AKI which decreased the clearance of his narcotics and lyrica leading to more hypotension and possibly is the etiology of his perceived unsteadiness.  I agree with holding the losartan and lyrica- watch blood pressure so does not get too low for him- try to keep above 130-140.  Did not appear to be obstruction on U/S but UOP seemed to be less before foley placed.  Now is nonoliguric so would anticipate plateau and improvement of renal function relatively soon.  Agree with holding metformin  at this time as well.    2. Hypertension/volume  - Although no overt hypotension I think SBP of 101 much different than his baseline and has given him some ATN 3. Unsteadiness/leg weakness- maybe due to decreased clearance of lyrica and narcotics vs relative hypotension.  Work up per primary and then get PT when able.   4. DM- per primary- sugars over 300 5. Hypokalemia- repletion given  Thank you for this consult, we will continue to follow with you    Morrisa Aldaba A 06/02/2015, 1:12 PM

## 2015-06-02 NOTE — Progress Notes (Addendum)
Patient ID: Kenneth Fox, male   DOB: [birth year], 44 y.o.   MRN: [id number] [facility] PROGRESS NOTE  LUM STILLINGER QMV:[id number] DOB: August 09, 1970 DOA: 06/01/2015 PCP: Mike Craze  Brief narrative:    44 y.o. male with a past medical history significant for IDDM ad diabetic neuropathy, gastroparesis, OSA on CPAP, chronic abdominal pain who presented to [facility] ED with leg weakness. He apparently had few falls in past few days. In addition, he was hypoxic at home for which reason  He came to ED for further evaluation.  In the ED, the patient was found to have an AKI with serum creatinine 5.2 mg/dL, elevated BNP, mildly elevated troponin and elevated d-dimer. The 12 lead EKG showed sinus rhythm. CXR showed no acute cardiopulmonary findings. Perfusion scan showed low probability for PE. Pt was admitted to SDU due to acute renal failure.  Assessment/Plan:    Principal Problem:   AKI (acute kidney injury) (HCC) - Unclear etiology, ?diabetic nephropathy, new baseline versus losartan, janumet - Janumet and losartan on hold  - Renal US relatively unremarkable - Cr further up this am from 5.2 --> 2.3 - Nephrology consulted - Continue to monitor urine output - Monitor in SDU  Active Problems:   Elevated troponin - Likely demand ischemia due to acute renal failure  - Trop 0.10 --> 0.09 - No chest pain - Normal sinus rhythm on 12 lead EKG - 2 D ECHO on this admission showed EF of 55 - 60% - Continue to monitor on telemetry     Uncontrolled diabetes mellitus with neurologic complication, with long-term current use of insulin (HCC) - A1c 1 year ago was 11.3; A1c on this admission is pending  - Now on SSI     Essential hypertension - Continue hydralazine 50 mg PO TID    Obstructive sleep apnea - CPAP at night     Diabetic gastroparesis (HCC) - On PRN Reglan - Tolerated diet this am     Hypokalemia - Due to ARF - Supplemented     Leukocytosis - Unclear etiology - No  evidence of acute infectious process     Morbid obesity due to excess calories (HCC) - Body mass index is 46.7 kg/(m^2). - Nutrition consulted    DVT Prophylaxis  - SCD's bilaterally   Code Status: Full.  Family Communication:  plan of care discussed with the patient Disposition Plan: Remains in SDU due to acute renal failure, low urine ouput   IV access:  Peripheral IV  Procedures and diagnostic studies:    Dg Chest 2 View 06/01/2015 No active cardiopulmonary disease.  US Renal 06/02/2015 Unremarkable renal ultrasound. No evidence for hydronephrosis. Evaluation suboptimal due to the patient's habitus; the bladder is not visualized.   Nm Pulmonary Perf And Vent 06/02/2015 Low probability for pulmonary embolus. Ventilation images are somewhat suboptimal.   Medical Consultants:  Nephrology  Other Consultants:  Nutrition DM coordinator   IAnti-Infectives:   None    Manson Passey, MD  [facility] Pager [phone]  Time spent in minutes: 25 minutes  If 7PM-7AM, please contact night-coverage [email or web address] Password [facility] 06/02/2015, 11:37 AM   LOS: 1 day    HPI/Subjective: No acute overnight events. Patient reports no nausea or vomiting.   Objective: Filed Vitals:   06/02/15 0800 06/02/15 0851 06/02/15 0900 06/02/15 1000  BP:  165/89    Pulse: 75 80 77 79  Temp:      TempSrc:      Resp: 17  Height:      Weight:      SpO2: 97% 100% 99% 94%    Intake/Output Summary (Last 24 hours) at 06/02/15 1137 Last data filed at 06/02/15 0657  Gross per 24 hour  Intake 141.46 ml  Output    250 ml  Net -108.54 ml    Exam:   General:  Pt is not in acute distress  Cardiovascular: Regular rate and rhythm, S1/S2 (+)  Respiratory: No wheezing, no crackles, no rhonchi  Abdomen: Soft, non tender, non distended, bowel sounds present  Extremities: No edema, pulses DP and PT palpable bilaterally  Neuro: Grossly nonfocal  Data Reviewed: Basic  Metabolic Panel:  Recent Labs Lab 05/29/15 0443 06/01/15 2011 06/02/15 0640  NA 141 134* 137  K 3.1* 3.4* 3.1*  CL 102 94* 96*  CO2 31 29 30   GLUCOSE 278* 298* 195*  BUN 9 32* 33*  CREATININE 1.44* 5.20* 5.38*  CALCIUM 8.9 7.9* 8.1*   Liver Function Tests:  Recent Labs Lab 05/29/15 0443  AST 14*  ALT 17  ALKPHOS 85  BILITOT 0.4  PROT 5.8*  ALBUMIN 2.3*    Recent Labs Lab 05/29/15 0443  LIPASE 53*   No results for input(s): AMMONIA in the last 168 hours. CBC:  Recent Labs Lab 05/29/15 0443 06/01/15 2011 06/02/15 0640  WBC 10.2 14.1* 14.2*  HGB 13.5 12.7* 12.2*  HCT 40.7 38.8* 37.7*  MCV 85.5 85.8 85.9  PLT 246 225 233   Cardiac Enzymes:  Recent Labs Lab 06/01/15 2019 06/02/15 0110 06/02/15 0640  TROPONINI 0.10* 0.11* 0.09*   BNP: Invalid input(s): POCBNP CBG:  Recent Labs Lab 06/01/15 1936 06/01/15 2129 06/02/15 0228 06/02/15 0737  GLUCAP 306* 209* 182* 201*    Recent Results (from the past 240 hour(s))  MRSA PCR Screening     Status: None   Collection Time: 06/02/15  4:00 AM  Result Value Ref Range Status   MRSA by PCR NEGATIVE NEGATIVE Final     Scheduled Meds: . hydrALAZINE  50 mg Oral TID  . [START ON 06/03/2015] Influenza vac split quadrivalent PF  0.5 mL Intramuscular Tomorrow-1000  . insulin aspart  0-9 Units Subcutaneous Q8H  . sodium chloride  3 mL Intravenous Q12H

## 2015-06-03 LAB — RENAL FUNCTION PANEL
ANION GAP: 10 (ref 5–15)
Albumin: 2.1 g/dL — ABNORMAL LOW (ref 3.5–5.0)
BUN: 39 mg/dL — AB (ref 6–20)
CHLORIDE: 97 mmol/L — AB (ref 101–111)
CO2: 30 mmol/L (ref 22–32)
Calcium: 8.2 mg/dL — ABNORMAL LOW (ref 8.9–10.3)
Creatinine, Ser: 4.3 mg/dL — ABNORMAL HIGH (ref 0.61–1.24)
GFR calc Af Amer: 18 mL/min — ABNORMAL LOW (ref >60)
GFR, EST NON AFRICAN AMERICAN: 15 mL/min — AB (ref >60)
GLUCOSE: 297 mg/dL — AB (ref 65–99)
POTASSIUM: 3.5 mmol/L (ref 3.5–5.1)
Phosphorus: 4.3 mg/dL (ref 2.5–4.6)
Sodium: 137 mmol/L (ref 135–145)

## 2015-06-03 LAB — GLUCOSE, CAPILLARY
GLUCOSE-CAPILLARY: 263 mg/dL — AB (ref 65–99)
GLUCOSE-CAPILLARY: 460 mg/dL — AB (ref 65–99)
Glucose-Capillary: 344 mg/dL — ABNORMAL HIGH (ref 65–99)
Glucose-Capillary: 288 mg/dL — ABNORMAL HIGH (ref 65–99)
Glucose-Capillary: 229 mg/dL — ABNORMAL HIGH (ref 65–99)

## 2015-06-03 MED ORDER — INSULIN ASPART 100 UNIT/ML ~~LOC~~ SOLN
6.0000 [IU] | Freq: Three times a day (TID) | SUBCUTANEOUS | Status: DC
  Administered 2015-06-03 – 2015-06-06 (×8): 6 [IU] via SUBCUTANEOUS

## 2015-06-03 MED ORDER — INSULIN ASPART 100 UNIT/ML ~~LOC~~ SOLN
0.0000 [IU] | Freq: Three times a day (TID) | SUBCUTANEOUS | Status: DC
  Administered 2015-06-03: 5 [IU] via SUBCUTANEOUS
  Administered 2015-06-03: 7 [IU] via SUBCUTANEOUS
  Administered 2015-06-03 – 2015-06-04 (×2): 3 [IU] via SUBCUTANEOUS
  Administered 2015-06-04: 2 [IU] via SUBCUTANEOUS
  Administered 2015-06-04: 1 [IU] via SUBCUTANEOUS
  Administered 2015-06-04: 3 [IU] via SUBCUTANEOUS
  Administered 2015-06-05 – 2015-06-06 (×5): 2 [IU] via SUBCUTANEOUS

## 2015-06-03 MED ORDER — INSULIN DETEMIR 100 UNIT/ML ~~LOC~~ SOLN
25.0000 [IU] | Freq: Every day | SUBCUTANEOUS | Status: DC
  Administered 2015-06-03 – 2015-06-04 (×2): 25 [IU] via SUBCUTANEOUS
  Filled 2015-06-03 (×2): qty 0.25

## 2015-06-03 MED ORDER — INSULIN ASPART 100 UNIT/ML ~~LOC~~ SOLN
8.0000 [IU] | Freq: Once | SUBCUTANEOUS | Status: AC
  Administered 2015-06-03: 8 [IU] via SUBCUTANEOUS

## 2015-06-03 NOTE — Progress Notes (Signed)
Inpatient Diabetes Program Recommendations  AACE/ADA: New Consensus Statement on Inpatient Glycemic Control (2015)  Target Ranges:  Prepandial:   less than 140 mg/dL      Peak postprandial:   less than 180 mg/dL (1-2 hours)      Critically ill patients:  140 - 180 mg/dL    Results for Kenneth Fox, Kenneth Fox (MRN [id number]) as of 06/03/2015 10:38  Ref. Range 06/02/2015 07:37 06/02/2015 11:57 06/02/2015 16:44 06/03/2015 00:21  Glucose-Capillary Latest Ref Range: 65-99 mg/dL 295201 (H) 284249 (H) 132339 (H) 460 (H)    Results for Kenneth Fox, Kenneth Fox (MRN [id number]) as of 06/03/2015 10:38  Ref. Range 06/03/2015 08:43  Glucose-Capillary Latest Ref Range: 65-99 mg/dL 366263 (H)     Admit with: DOE, Acute Kidney Injury  History: DM, HTN, OSA  Home DM Meds: Levemir 80 units QHS       Novolog 10-20 units tid per SSI       Janumet 100/1000- 1 tablet daily  Current Insulin Orders: Novolog Sensitive SSI (0-9 units) TID AC/HS     MD- Please consider the following in-hospital insulin adjustments:  1. Start Levemir 25 units daily (this would be about 1/3 of patient's home dose of Levemir insulin)- Can titrate upward as needed based on fasting glucose level.  2. Start Novolog Meal Coverage- Novolog 6 units tid with meals (patient eating 100% of meals)      --Will follow patient during hospitalization--  Ambrose FinlandJeannine Johnston Deziray Nabi RN, MSN, CDE Diabetes Coordinator Inpatient Glycemic Control Team Team Pager: [phone] (8a-5p)

## 2015-06-03 NOTE — Progress Notes (Signed)
Patient refuses CPAP at this time. Placed on 4L [place].

## 2015-06-03 NOTE — Progress Notes (Signed)
Utilization review completed.  

## 2015-06-03 NOTE — Progress Notes (Signed)
Nutrition Education Note RD consulted for nutrition education regarding gastroparesis  Lab Results  Component Value Date   HGBA1C 11.3* 03/10/2014    RD provided "Nutrition Therapy for Gastroparesis" handout from the [employer] of [employer]. Discussed different food groups and their effects on gastric emptying, emphasizing low fiber, low fat foods.  Discussed importance of small, frequent meals throughout the day. Provided examples of ways to balance meals/snacks and encouraged intake of low-fiber, simple carbohydrates. Teach back method used.  Expect fair compliance.  Body mass index is 46.94 kg/(m^2). Pt meets criteria for obese class III based on current BMI.  Current diet order is carb modified, patient is consuming approximately 100% of meals at this time. Labs and medications reviewed. No further nutrition interventions warranted at this time. RD contact information provided. If additional nutrition issues arise, please re-consult RD.  Kenneth AnoWilliam M. Dalaya Suppa, MS, RD LDN After Hours/Weekend Pager [phone]

## 2015-06-03 NOTE — Progress Notes (Signed)
RT placed patient on CPAP. CPAP humidifier has sterile water in it and 4L O2 is bled into CPAP circuit. Patient is resting comfortably with O2 Sat 97%.

## 2015-06-03 NOTE — Progress Notes (Signed)
Patient stated he would placed cpap on himself when he is ready for bed. Patient will need O2 bled in, and RN is aware. RT encouraged patient and RN to call if assistance is needed.

## 2015-06-03 NOTE — Progress Notes (Signed)
RT spoke with ICU RN concerning patient desaturations while asleep after patient was transferred to 1521. RN stated that she made floor RN aware of desaturation and that patient was refusing to take CPAP upstairs because he would not need it. RT called floor RN, Lily KocherKirsten Ryan, and discussed situation further. RT and floor RN agreed that CPAP being at bedside would be most appropriate for patient and attempts to encourage use would continue. RT came to patient's room and spoke with patient and wife about the importance of the patient wearing CPAP. RT expressed concerns of patient desaturations of 50-60%, or lower, to patient and wife. Wife stated that patient has CPAP at home but does not comply with it there either. She also states that he is "stubborn" and "prideful". Again, RT discussed with them that it is not an unusual for people to require CPAP at night and that there are different options available through home health to find a mask the patient would be more comfortable in.  RT will continue to monitor patient and encourage him in use of CPAP. RN aware of discussion with family.

## 2015-06-03 NOTE — Evaluation (Signed)
Physical Therapy Evaluation Patient Details Name: Kenneth Fox MRN: [id number] DOB: [birth year] Today's Date: 06/03/2015   History of Present Illness  44 y.o. male with a past medical history significant for IDDM c/b neuropathy and gastroparesis with chronic abdominal pain, HTN, OSA on CPAP, and morbid obesity who presents with acute leg weakness (with reported fall) and dyspnea on exertion. Dx of AKI, dypnea.   Clinical Impression  Pt admitted with above diagnosis. Pt currently with functional limitations due to the deficits listed below (see PT Problem List). Pt ambulated 8' with RW and +2 assist for safety due to recent fall. SaO2 90-93% on RA. Pt declines ST-SNF, HHPT recommended.  Pt will benefit from skilled PT to increase their independence and safety with mobility to allow discharge to the venue listed below.       Follow Up Recommendations Home health PT;Supervision for mobility/OOB (SNF recommended, pt declined)    Equipment Recommendations  Rolling walker with 5" wheels (bariatric RW)    Recommendations for Other Services OT consult     Precautions / Restrictions Precautions Precautions: Fall Precaution Comments: monitor O2, 1 fall in bathroom PTA, denies other falls in past year Restrictions Weight Bearing Restrictions: No      Mobility  Bed Mobility Overal bed mobility: Modified Independent             General bed mobility comments: HOB up 35*, used rail, increased time  Transfers Overall transfer level: Needs assistance Equipment used: Rolling walker (2 wheeled) Transfers: Sit to/from Stand Sit to Stand: +2 safety/equipment;Min assist         General transfer comment: verbal cues for hand placement, +2 safety due to recent falls  Ambulation/Gait Ambulation/Gait assistance: Min guard;+2 safety/equipment Ambulation Distance (Feet): 8 Feet Assistive device: Rolling walker (2 wheeled) Gait Pattern/deviations: Step-through pattern;Decreased step  length - right;Decreased step length - left   Gait velocity interpretation: Below normal speed for age/gender General Gait Details: distance limited by BLE fatigue, +2 for safety due to recent fall; SaO2 90-93% on RA with activity  Stairs            Wheelchair Mobility    Modified Rankin (Stroke Patients Only)       Balance Overall balance assessment: Needs assistance   Sitting balance-Leahy Scale: Good     Standing balance support: Bilateral upper extremity supported Standing balance-Leahy Scale: Poor Standing balance comment: relies on BUE support                             Pertinent Vitals/Pain Pain Assessment: No/denies pain    Home Living Family/patient expects to be discharged to:: Private residence Living Arrangements: Alone Available Help at Discharge: Available PRN/intermittently   Home Access: Stairs to enter Entrance Stairs-Rails: Right;Left;Can reach both Entrance Stairs-Number of Steps: 4 Home Layout: One level Home Equipment: Cane - single point;Shower seat Additional Comments: wife lives "across town with the kids" and isn't able to assist; has a friend that can stop in at times but he's on dialysis, pt declined SNF    Prior Function Level of Independence: Independent with assistive device(s)         Comments: uses cane, independent with showering and dressing; walks only 15' at baseline limited by B feet neuropathy pain, a few months ago he could walk farther     Hand Dominance        Extremity/Trunk Assessment   Upper Extremity Assessment: Overall WFL for tasks assessed (  pt reports fingertips are numb, which is new )           Lower Extremity Assessment: Generalized weakness (knee extension 4/5 B; decreased sensation light touch B feet)      Cervical / Trunk Assessment: Normal  Communication   Communication: No difficulties  Cognition Arousal/Alertness: Awake/alert Behavior During Therapy: WFL for tasks  assessed/performed Overall Cognitive Status: Within Functional Limits for tasks assessed                      General Comments      Exercises        Assessment/Plan    PT Assessment Patient needs continued PT services  PT Diagnosis Difficulty walking;Generalized weakness   PT Problem List Decreased strength;Decreased activity tolerance;Decreased balance;Decreased knowledge of use of DME;Decreased mobility;Obesity;Impaired sensation  PT Treatment Interventions DME instruction;Gait training;Stair training;Functional mobility training;Therapeutic activities;Patient/family education;Therapeutic exercise;Balance training   PT Goals (Current goals can be found in the Care Plan section) Acute Rehab PT Goals Patient Stated Goal: to be able to walk farther PT Goal Formulation: With patient Time For Goal Achievement: 07/18/15 Potential to Achieve Goals: Good    Frequency Min 3X/week   Barriers to discharge Decreased caregiver support      Co-evaluation               End of Session Equipment Utilized During Treatment: Gait belt Activity Tolerance: Patient limited by fatigue Patient left: in chair;with call bell/phone within reach;with family/visitor present           Time: 0926-0952 PT Time Calculation (min) (ACUTE ONLY): 26 min   Charges:   PT Evaluation $Initial PT Evaluation Tier I: 1 Procedure PT Treatments $Gait Training: 8-22 mins   PT G Codes:        Tamala SerUhlenberg, Jowana Thumma Kistler 06/03/2015, 10:22 AM [phone]

## 2015-06-03 NOTE — Progress Notes (Addendum)
  [facility] Progress Note   Subjective: creat down 5.3 > 4.3 today, great UOP 1.8 L yesterday . BP's good 150/ 70.   Filed Vitals:   06/03/15 0800 06/03/15 0850 06/03/15 1010 06/03/15 1100  BP: 157/76   130/67  Pulse: 86   79  Temp:  98.3 F (36.8 C)  97.8 F (36.6 C)  TempSrc:    Oral  Resp: 20   18  Height:      Weight:      SpO2: 97%  90% 96%    Inpatient medications: . antiseptic oral rinse  7 mL Mouth Rinse q12n4p  . chlorhexidine  15 mL Mouth Rinse BID  . hydrALAZINE  50 mg Oral TID  . insulin aspart  0-9 Units Subcutaneous TID AC & HS  . insulin aspart  6 Units Subcutaneous TID WC  . insulin detemir  25 Units Subcutaneous Daily  . sodium chloride  3 mL Intravenous Q12H     HYDROmorphone (DILAUDID) injection, labetalol, metoCLOPramide, ondansetron **OR** ondansetron (ZOFRAN) IV, oxyCODONE, polyethylene glycol, technetium TC 37M diethylenetriame-pentaacetic acid  Exam: Alert, obese AAM no distress +JVD Chest clear bilat RRR no RG ABd obese no ascites Ext 1+ edema bilat LE's Neuro alert, no asterixis  UA >300 prot, 0-5 rbc/ wbc UNa < 10 , UCreat 288 ECHO LV 55-60%, septal flattening c/w RV pressure overload, RV mod reduced fxn and dilated, mild-mod PAH by doppler but severe PAH by 2D measurements     Assessment: 1 AKI low BP/ ATN 2 CKD creat 1.4 baseline, proteinuria prob diab neph 3 Obesity 4 DM 5 Unsteadiness/ leg weakness 6 Chronic pain on lyrica/ narcotics 7 Pulm HTN/ RHF prob due to obesity/ OHS/ OSA 8 HTN on labet/ hydral  Plan - cont supportive care, holding ARB. Has signs of PAH and R heart failure on ECHO, prob needs to be eval for sleep apnea. Obesity primary issue.    [name] MD [facility] pager [phone]    cell [phone] 06/03/2015, 1:16 PM    Recent Labs Lab 06/01/15 2011 06/02/15 0640 06/03/15 0437  NA 134* 137 137  K 3.4* 3.1* 3.5  CL 94* 96* 97*  CO2 29 30 30   GLUCOSE 298* 195* 297*   BUN 32* 33* 39*  CREATININE 5.20* 5.38* 4.30*  CALCIUM 7.9* 8.1* 8.2*  PHOS  --   --  4.3    Recent Labs Lab 05/29/15 0443 06/03/15 0437  AST 14*  --   ALT 17  --   ALKPHOS 85  --   BILITOT 0.4  --   PROT 5.8*  --   ALBUMIN 2.3* 2.1*    Recent Labs Lab 05/29/15 0443 06/01/15 2011 06/02/15 0640  WBC 10.2 14.1* 14.2*  HGB 13.5 12.7* 12.2*  HCT 40.7 38.8* 37.7*  MCV 85.5 85.8 85.9  PLT 246 225 233

## 2015-06-03 NOTE — Progress Notes (Addendum)
Patient ID: Kenneth Fox, male   DOB: 27-Nov-1970, 44 y.o.   MRN: [id number] [facility] PROGRESS NOTE  TARAY NORMOYLE WGN:[id number] DOB: 08/15/1970 DOA: 06/01/2015 PCP: Mike Craze  Brief narrative:    44 y.o. male with a past medical history significant for IDDM ad diabetic neuropathy, gastroparesis, OSA on CPAP, chronic abdominal pain who presented to [facility] ED with leg weakness. He apparently had few falls in past few days. In addition, he was hypoxic at home for which reason  He came to ED for further evaluation.  In the ED, the patient was found to have an AKI with serum creatinine 5.2 mg/dL, elevated BNP, mildly elevated troponin and elevated d-dimer. The 12 lead EKG showed sinus rhythm. CXR showed no acute cardiopulmonary findings. Perfusion scan showed low probability for PE. Pt was admitted to SDU due to acute renal failure.  Assessment/Plan:    Principal Problem:   AKI (acute kidney injury) (HCC) - Unclear etiology, likely diabetic nephropathy, new baseline  - We held losartan and janumet as those meds could have contributed to ARF - Renal US unremarkable  - Renal consulted - Cr improving, 4.3 this am   Active Problems:   Elevated troponin - Likely demand ischemia due to acute renal failure  - No chest pain  - the 12 lead EKG On admission showed sinus rhythm - Trop 0.09 - 2 D ECHO on this admission showed EF of 55 - 60%    Uncontrolled diabetes mellitus with neurologic complication, with long-term current use of insulin (HCC) - A1c 1 year ago was 11.3; A1c on this admission is pending as of 12/26 - DM coordinator recommended levemir 25 units daily and novolog 6 units SSI, order placed     Essential hypertension - Continue hydralazine 50 mg PO TID    Obstructive sleep apnea - CPAP at night  - Stable resp status     Diabetic gastroparesis (HCC) - On PRN Reglan - Tolerated diet this am     Hypokalemia - Due to ARF - Supplemented     Leukocytosis -  Unclear etiology - No evidence of acute infectious process     Morbid obesity due to excess calories (HCC) - Body mass index is 46.94 kg/(m^2). - Seen by dietician   DVT Prophylaxis  - SCD's   Code Status: Full.  Family Communication:  plan of care discussed with the patient Disposition Plan: Home once renal function closer to baseline normal Cr, likely by 06/05/2015.  IV access:  Peripheral IV  Procedures and diagnostic studies:    Dg Chest 2 View 06/01/2015 No active cardiopulmonary disease.  US Renal 06/02/2015 Unremarkable renal ultrasound. No evidence for hydronephrosis. Evaluation suboptimal due to the patient's habitus; the bladder is not visualized.   Nm Pulmonary Perf And Vent 06/02/2015 Low probability for pulmonary embolus. Ventilation images are somewhat suboptimal.   Medical Consultants:  Nephrology  Other Consultants:  Nutrition DM coordinator   IAnti-Infectives:   None    Manson Passey, MD  [facility] Pager [phone]  Time spent in minutes: 25 minutes  If 7PM-7AM, please contact night-coverage [email or web address] Password [facility] 06/03/2015, 12:47 PM   LOS: 2 days    HPI/Subjective: No acute overnight events. Patient reports no resp distress.   Objective: Filed Vitals:   06/03/15 0800 06/03/15 0850 06/03/15 1010 06/03/15 1100  BP: 157/76   130/67  Pulse: 86   79  Temp:  98.3 F (36.8 C)  97.8 F (36.6 C)  TempSrc:  Oral  Resp: 20   18  Height:      Weight:      SpO2: 97%  90% 96%    Intake/Output Summary (Last 24 hours) at 06/03/15 1247 Last data filed at 06/03/15 [id number]  Gross per 24 hour  Intake    363 ml  Output   1855 ml  Net  -1492 ml    Exam:   General:  Pt is not in acute distress  Cardiovascular: RRR, (+) S1, S2   Respiratory: B/L air entry, no wheezing   Abdomen: (+) BS, non tender   Extremities: No leg swelling, palpable pulses   Neuro: Nonfocal  Data Reviewed: Basic Metabolic Panel:  Recent Labs Lab  05/29/15 0443 06/01/15 2011 06/02/15 0640 06/03/15 0437  NA 141 134* 137 137  K 3.1* 3.4* 3.1* 3.5  CL 102 94* 96* 97*  CO2 31 29 30 30   GLUCOSE 278* 298* 195* 297*  BUN 9 32* 33* 39*  CREATININE 1.44* 5.20* 5.38* 4.30*  CALCIUM 8.9 7.9* 8.1* 8.2*  PHOS  --   --   --  4.3   Liver Function Tests:  Recent Labs Lab 05/29/15 0443 06/03/15 0437  AST 14*  --   ALT 17  --   ALKPHOS 85  --   BILITOT 0.4  --   PROT 5.8*  --   ALBUMIN 2.3* 2.1*    Recent Labs Lab 05/29/15 0443  LIPASE 53*   No results for input(s): AMMONIA in the last 168 hours. CBC:  Recent Labs Lab 05/29/15 0443 06/01/15 2011 06/02/15 0640  WBC 10.2 14.1* 14.2*  HGB 13.5 12.7* 12.2*  HCT 40.7 38.8* 37.7*  MCV 85.5 85.8 85.9  PLT 246 225 233   Cardiac Enzymes:  Recent Labs Lab 06/01/15 2019 06/02/15 0110 06/02/15 0640 06/02/15 1307  TROPONINI 0.10* 0.11* 0.09* 0.06*   BNP: Invalid input(s): POCBNP CBG:  Recent Labs Lab 06/02/15 1157 06/02/15 1644 06/03/15 0021 06/03/15 0843 06/03/15 1210  GLUCAP 249* 339* 460* 263* 344*    Recent Results (from the past 240 hour(s))  MRSA PCR Screening     Status: None   Collection Time: 06/02/15  4:00 AM  Result Value Ref Range Status   MRSA by PCR NEGATIVE NEGATIVE Final     Scheduled Meds: . antiseptic oral rinse  7 mL Mouth Rinse q12n4p  . chlorhexidine  15 mL Mouth Rinse BID  . hydrALAZINE  50 mg Oral TID  . insulin aspart  0-9 Units Subcutaneous TID AC & HS  . insulin aspart  6 Units Subcutaneous TID WC  . insulin detemir  25 Units Subcutaneous Daily  . sodium chloride  3 mL Intravenous Q12H

## 2015-06-04 LAB — GLUCOSE, CAPILLARY
GLUCOSE-CAPILLARY: 211 mg/dL — AB (ref 65–99)
GLUCOSE-CAPILLARY: 150 mg/dL — AB (ref 65–99)
Glucose-Capillary: 210 mg/dL — ABNORMAL HIGH (ref 65–99)
Glucose-Capillary: 158 mg/dL — ABNORMAL HIGH (ref 65–99)

## 2015-06-04 LAB — BASIC METABOLIC PANEL
Anion gap: 9 (ref 5–15)
BUN: 33 mg/dL — AB (ref 6–20)
CALCIUM: 8.1 mg/dL — AB (ref 8.9–10.3)
CO2: 31 mmol/L (ref 22–32)
CREATININE: 3 mg/dL — AB (ref 0.61–1.24)
Chloride: 98 mmol/L — ABNORMAL LOW (ref 101–111)
GFR calc Af Amer: 27 mL/min — ABNORMAL LOW (ref >60)
GFR, EST NON AFRICAN AMERICAN: 23 mL/min — AB (ref >60)
Glucose, Bld: 214 mg/dL — ABNORMAL HIGH (ref 65–99)
Potassium: 3.1 mmol/L — ABNORMAL LOW (ref 3.5–5.1)
SODIUM: 138 mmol/L (ref 135–145)

## 2015-06-04 LAB — HEMOGLOBIN A1C
HEMOGLOBIN A1C: 11.1 % — AB (ref 4.8–5.6)
MEAN PLASMA GLUCOSE: 272 mg/dL

## 2015-06-04 MED ORDER — AMLODIPINE BESYLATE 5 MG PO TABS
5.0000 mg | ORAL_TABLET | Freq: Every day | ORAL | Status: DC
  Administered 2015-06-04 – 2015-06-06 (×3): 5 mg via ORAL
  Filled 2015-06-04 (×3): qty 1

## 2015-06-04 MED ORDER — POTASSIUM CHLORIDE CRYS ER 20 MEQ PO TBCR
40.0000 meq | EXTENDED_RELEASE_TABLET | Freq: Once | ORAL | Status: AC
  Administered 2015-06-04: 40 meq via ORAL
  Filled 2015-06-04: qty 2

## 2015-06-04 MED ORDER — INSULIN DETEMIR 100 UNIT/ML ~~LOC~~ SOLN
40.0000 [IU] | Freq: Every day | SUBCUTANEOUS | Status: DC
  Administered 2015-06-05 – 2015-06-06 (×2): 40 [IU] via SUBCUTANEOUS
  Filled 2015-06-04 (×2): qty 0.4

## 2015-06-04 MED ORDER — AMLODIPINE BESYLATE 5 MG PO TABS
5.0000 mg | ORAL_TABLET | Freq: Every day | ORAL | Status: DC
  Filled 2015-06-04: qty 1

## 2015-06-04 NOTE — Progress Notes (Signed)
  [facility] Progress Note   Subjective: creat down 3.0 today, good UOP.  BP good.   Filed Vitals:   06/03/15 1010 06/03/15 1100 06/03/15 2045 06/04/15 0602  BP:  130/67 144/81 153/76  Pulse:  79 81 79  Temp:  97.8 F (36.6 C) 97.9 F (36.6 C) 98.6 F (37 C)  TempSrc:  Oral Oral Oral  Resp:  18 20 20   Height:      Weight:    153.543 kg (338 lb 8 oz)  SpO2: 90% 96% 96% 95%    Inpatient medications: . antiseptic oral rinse  7 mL Mouth Rinse q12n4p  . chlorhexidine  15 mL Mouth Rinse BID  . hydrALAZINE  50 mg Oral TID  . insulin aspart  0-9 Units Subcutaneous TID AC & HS  . insulin aspart  6 Units Subcutaneous TID WC  . insulin detemir  25 Units Subcutaneous Daily  . sodium chloride  3 mL Intravenous Q12H     HYDROmorphone (DILAUDID) injection, labetalol, metoCLOPramide, ondansetron **OR** ondansetron (ZOFRAN) IV, oxyCODONE, polyethylene glycol, technetium TC 6M diethylenetriame-pentaacetic acid  Exam: Alert, obese AAM no distress +JVD Chest clear bilat RRR no RG ABd obese no ascites Ext 1+ edema bilat LE's Neuro alert, no asterixis  UA >300 prot, 0-5 rbc/ wbc UNa < 10 , UCreat 288 ECHO LV 55-60%, septal flattening c/w RV pressure overload, RV dilated w mod reduced fxn, mild-mod PAH by doppler but severe by 2D measurements     Assessment: 1 AKI low BP/ ATN / ARB - resolving 2 CKD creat 1.4 baseline, proteinuria prob diab neph 3 Obesity morbid 4 DM 5 Unsteadiness/ leg weakness - better 6 Chronic pain on lyrica/ narcotics 7 Pulm HTN/ RHF prob due to obesity/ OHS/ OSA- has LE edema and possibly ascites on exam which would go along with R HF 8 HTN on labet/ hydral  Plan - avoid ARB/ ACEi indefinitely in this patient w R HF and AKI episode as he will be at risk of recurrent AKI.  Have arranged 1-2 month f/u with neph at [facility].  He can f/u with PCP and should have labs in 1-2 weeks after dc.  Will sign off.     Kenneth Moselleob Kenneth Faulstich MD [facility] pager [phone]    cell [phone] 06/04/2015, 9:08 AM    Recent Labs Lab 06/02/15 0640 06/03/15 0437 06/04/15 0556  NA 137 137 138  K 3.1* 3.5 3.1*  CL 96* 97* 98*  CO2 30 30 31   GLUCOSE 195* 297* 214*  BUN 33* 39* 33*  CREATININE 5.38* 4.30* 3.00*  CALCIUM 8.1* 8.2* 8.1*  PHOS  --  4.3  --     Recent Labs Lab 05/29/15 0443 06/03/15 0437  AST 14*  --   ALT 17  --   ALKPHOS 85  --   BILITOT 0.4  --   PROT 5.8*  --   ALBUMIN 2.3* 2.1*    Recent Labs Lab 05/29/15 0443 06/01/15 2011 06/02/15 0640  WBC 10.2 14.1* 14.2*  HGB 13.5 12.7* 12.2*  HCT 40.7 38.8* 37.7*  MCV 85.5 85.8 85.9  PLT 246 225 233

## 2015-06-04 NOTE — Progress Notes (Addendum)
Spoke with patient today about his elevated A1c of 11.1%.  Patient stated he is not surprised his A1c is so high.  Sees Dr. Sharl MaKerr for Endocrinology needs at [facility].  Per patient report, patient has follow-up appointment with Dr. Sharl MaKerr in January.  Has had issues with transportation in the past, but patient stated he is working on his transportation issues.  Patient stated his CBGs always run high at home.  Stated Dr. Sharl MaKerr had him on Invokana (SGLT-2 receptor inhibitor drug) but he had to come off that medicine b/c of his kidney function.  Patient stated he has had DM for more than 10 years and started insulin about 8 years ago.  Likely does not make much insulin at this point.  Patient stated to me he thinks his nutrition at home is likely the cause of his elevated CBGs.  Has to take care of his sick brother (brother needs dialysis treatments) and rarely takes care of himself per patient report.  Will ask RD to see pt to assist with dietary reinforcement.  Will also place OP DM education referral so patient can [facility] after d/c. Does not know how to count carbohydrates and may benefit from a more structured Novolog insulin plan to cover carbohydrates at home instead of just using a SSI regimen with Novolog.  Asked patient if he is keeping a logbook of his CBGs at home.  Patient stated his CBGs meter battery is dead.  Asked him if he tried replacing the battery and patient stated he didn't have time b/c he was admitted to the hospital.  Encouraged patient to replace battery in his CBG meter asap once he gets home.  If meter not working after battery replacement, encouraged patient to call Dr. Sharl MaKerr to get new Rx for CBG meter.  Encouraged patient to either take meter with him to Dr. Daune PerchKerr's office for download or to keep written record for Dr. Sharl MaKerr so that Dr. Sharl MaKerr can make insulin adjustments based on CBG readings.      --Will  follow patient during hospitalization--  Ambrose FinlandJeannine Johnston Silvia Markuson RN, MSN, CDE Diabetes Coordinator Inpatient Glycemic Control Team Team Pager: [phone] (8a-5p)

## 2015-06-04 NOTE — Progress Notes (Addendum)
Patient ID: Kenneth Fox, male   DOB: March 04, 1971, 44 y.o.   MRN: [id number] [facility] PROGRESS NOTE  Kenneth Fox FAO:[id number] DOB: March 04, 1971 DOA: 06/01/2015 PCP: Mike CrazeLONG,[name], PA-C  Brief narrative:    44 y.o. male with a past medical history significant for IDDM ad diabetic neuropathy, gastroparesis, OSA on CPAP, chronic abdominal pain who [facility] with [facility] weakness. He apparently had few falls in past few days. In addition, he was hypoxic at home for which reason  He came to ED for further evaluation.  In the ED, the patient was found to have an AKI with serum creatinine 5.2 mg/dL, elevated BNP, mildly elevated troponin and elevated d-dimer. The 12 lead EKG showed sinus rhythm. CXR showed no acute cardiopulmonary findings. Perfusion scan showed low probability for PE. Pt was admitted to SDU due to acute renal failure. Nephrology has seen the pt in consultation. His Cr started to improve since then.  Assessment/Plan:    Principal Problem:   AKI (acute kidney injury) (HCC) - Perhaps diabetic nephropathy, janumet and losartan combination - Those meds placed on hold since admission - Unremarkable US - Nephrology consulted, sure enough Cr started to trend down, 3.0 this am  Active Problems:   Elevated troponin - Likely demand ischemia from AKI - The 12 lead EKG showed sinus rhythm - Trop 0.09, no complaints of chest ain  - 2 D ECHO on this admission showed EF of 55 - 60%    Uncontrolled diabetes mellitus with neurologic complication, with long-term current use of insulin (HCC) - A1c is 11.1 indicating poor glycemic control  - Now on levemir 25 units daily and novolog 6 units SSI - CBG's in past 24 hours: 229, 211, 210 so Dm coordinator recommended increasing levemir to 40 units, order placed     Essential hypertension - Continue hydralazine 50 mg PO TID - BP 153/76 - Will add Norvasc 5 mg daily for better BP control     Obstructive sleep apnea - CPAP at  night     Diabetic gastroparesis (HCC) - Tolerates current diet     Hypokalemia - Due to renal failure, supplemented     Leukocytosis - No evidence of acute infectious process     Morbid obesity due to excess calories (HCC) - Body mass index is 47.23 kg/(m^2). - Nutrition consulted    DVT Prophylaxis  - SCD's while pt in hospital   Code Status: Full.  Family Communication:  plan of care discussed with the patient Disposition Plan: 06/06/2015.  IV access:  Peripheral IV  Procedures and diagnostic studies:    Dg Chest 2 View 06/01/2015 No active cardiopulmonary disease.  [place] Renal 06/02/2015 Unremarkable renal ultrasound. No evidence for hydronephrosis. Evaluation suboptimal due to the patient's habitus; the bladder is not visualized.   Nm Pulmonary Perf And Vent 06/02/2015 Low probability for pulmonary embolus. Ventilation images are somewhat suboptimal.   Medical Consultants:  Nephrology  Other Consultants:  Nutrition DM coordinator   IAnti-Infectives:   None    Manson PasseyEVINE, Veasna Santibanez, MD  [facility] Pager [phone]  Time spent in minutes: 25 minutes  If 7PM-7AM, please contact night-coverage [email or web address] Password [email or web address] 06/04/2015, 12:43 PM   LOS: 3 days    HPI/Subjective: No acute overnight events. Patient reports he feels better.  Objective: Filed Vitals:   06/03/15 1010 06/03/15 1100 06/03/15 2045 06/04/15 0602  BP:  130/67 144/81 153/76  Pulse:  79 81 79  Temp:  97.8 F (36.6 C) 97.9 F (  36.6 C) 98.6 F (37 C)  TempSrc:  Oral Oral Oral  Resp:  18 20 20   Height:      Weight:    153.543 kg (338 lb 8 oz)  SpO2: 90% 96% 96% 95%    Intake/Output Summary (Last 24 hours) at 06/04/15 1243 Last data filed at 06/04/15 0900  Gross per 24 hour  Intake    480 ml  Output   4350 ml  Net  -3870 ml    Exam:   General:  Pt is obese, no distress  Cardiovascular: Rate controlled, appreciate S1, S2   Respiratory: no wheezing, no rhonchi    Abdomen: obese, non tender   Extremities: No edema, palpable pulses bilaterally   Neuro: No focal deficits   Data Reviewed: Basic Metabolic Panel:  Recent Labs Lab 05/29/15 0443 06/01/15 2011 06/02/15 0640 06/03/15 0437 06/04/15 0556  NA 141 134* 137 137 138  K 3.1* 3.4* 3.1* 3.5 3.1*  CL 102 94* 96* 97* 98*  CO2 GLUCOSE 278* 298* 195* 297* 214*  BUN 9 32* 33* 39* 33*  CREATININE 1.44* 5.20* 5.38* 4.30* 3.00*  CALCIUM 8.9 7.9* 8.1* 8.2* 8.1*  PHOS  --   --   --  4.3  --    Liver Function Tests:  Recent Labs Lab 05/29/15 0443 06/03/15 0437  AST 14*  --   ALT 17  --   ALKPHOS 85  --   BILITOT 0.4  --   PROT 5.8*  --   ALBUMIN 2.3* 2.1*    Recent Labs Lab 05/29/15 0443  LIPASE 53*   No results for input(s): AMMONIA in the last 168 hours. CBC:  Recent Labs Lab 05/29/15 0443 06/01/15 2011 06/02/15 0640  WBC 10.2 14.1* 14.2*  HGB 13.5 12.7* 12.2*  HCT 40.7 38.8* 37.7*  MCV 85.5 85.8 85.9  PLT 246 225 233   Cardiac Enzymes:  Recent Labs Lab 06/01/15 2019 06/02/15 0110 06/02/15 0640 06/02/15 1307  TROPONINI 0.10* 0.11* 0.09* 0.06*   BNP: Invalid input(s): POCBNP CBG:  Recent Labs Lab 06/03/15 1210 06/03/15 1650 06/03/15 2150 06/04/15 0754 06/04/15 1209  GLUCAP 344* 288* 229* 211* 210*    Recent Results (from the past 240 hour(s))  MRSA PCR Screening     Status: None   Collection Time: 06/02/15  4:00 AM  Result Value Ref Range Status   MRSA by PCR NEGATIVE NEGATIVE Final     Scheduled Meds: . antiseptic oral rinse  7 mL Mouth Rinse q12n4p  . chlorhexidine  15 mL Mouth Rinse BID  . hydrALAZINE  50 mg Oral TID  . insulin aspart  0-9 Units Subcutaneous TID AC & HS  . insulin aspart  6 Units Subcutaneous TID WC  . insulin detemir  25 Units Subcutaneous Daily  . sodi um chloride  3 mL Intravenous Q12H

## 2015-06-04 NOTE — Progress Notes (Signed)
Pt stated that he will self administer CPAP when ready for bed.  RT to monitor and assess as needed.  

## 2015-06-04 NOTE — Progress Notes (Signed)
RT placed patient on CPAP. Patient pressure was changed to auto 8-20 cmH2O from 17 cmH2O for patient comfort. 6 liters oxygen bleed into patients tubing to keep oxygenation up during sleep.Sterile water was added to water chamber for humidification. Patient is tolerating well.RT will continue to monitor.

## 2015-06-04 NOTE — Progress Notes (Signed)
Inpatient Diabetes Program Recommendations  AACE/ADA: New Consensus Statement on Inpatient Glycemic Control (2015)  Target Ranges:  Prepandial:   less than 140 mg/dL      Peak postprandial:   less than 180 mg/dL (1-2 hours)      Critically ill patients:  140 - 180 mg/dL    Results for Lucky RathkeFISCHER, Journee E (MRN [id number]) as of 06/04/2015 10:42  Ref. Range 06/03/2015 08:43 06/03/2015 12:10 06/03/2015 16:50 06/03/2015 21:50  Glucose-Capillary Latest Ref Range: 65-99 mg/dL 409263 (H) 811344 (H) 914288 (H) 229 (H)    Results for Lucky RathkeFISCHER, Churchill E (MRN [id number]) as of 06/04/2015 10:42  Ref. Range 06/04/2015 07:54  Glucose-Capillary Latest Ref Range: 65-99 mg/dL 086211 (H)     Admit with: DOE, Acute Kidney Injury  History: DM, HTN, OSA  Home DM Meds: Levemir 80 units QHS  Novolog 10-20 units tid per SSI  Janumet 100/1000- 1 tablet daily  Current Insulin Orders: Novolog Sensitive SSI (0-9 units) TID AC/HS      Levemir 25 units daily      Novolog 6 units tidwc     MD- Please consider increasing Levemir to 40 units daily (50% home dose)    --Will follow patient during hospitalization--  Ambrose FinlandJeannine Johnston Deronda Christian RN, MSN, CDE Diabetes Coordinator Inpatient Glycemic Control Team Team Pager: [phone] (8a-5p)

## 2015-06-04 NOTE — Progress Notes (Signed)
Physical Therapy Treatment Patient Details Name: FLAVIO LINDROTH MRN: [id number] DOB: 1971-04-19 Today's Date: 06/04/2015    History of Present Illness 44 y.o. male with a past medical history significant for IDDM c/b neuropathy and gastroparesis with chronic abdominal pain, HTN, OSA on CPAP, and morbid obesity who presents with acute leg weakness (with reported fall) and dyspnea on exertion. Dx of AKI, dypnea.     PT Comments    Pt able to tolerate improving ambulation distance however only 30 feet x2.  Pt reports bil calves tingling/tightness.  Pt also reported blurred vision upon return to bed which improved with resting in supine.  Follow Up Recommendations  Home health PT;Supervision for mobility/OOB     Equipment Recommendations  Rolling walker with 5" wheels    Recommendations for Other Services       Precautions / Restrictions Precautions Precautions: Fall Precaution Comments: monitor O2, 1 fall in bathroom PTA, denies other falls in past year Restrictions Weight Bearing Restrictions: No    Mobility  Bed Mobility Overal bed mobility: Modified Independent             General bed mobility comments: HOB up 35*, used rail, increased time  Transfers Overall transfer level: Needs assistance Equipment used: Rolling walker (2 wheeled) Transfers: Sit to/from Stand Sit to Stand: Min assist         General transfer comment: verbal cues for hand placement, assist for rise, better transfer from recliner using armrests  Ambulation/Gait Ambulation/Gait assistance: Min guard Ambulation Distance (Feet): 30 Feet (x2) Assistive device: Rolling walker (2 wheeled) Gait Pattern/deviations: Step-through pattern;Decreased stride length;Trunk flexed;Wide base of support     General Gait Details: 30 feet x2, required seated rest break due to c/o bil calves hurting, SPO2 92% room air during ambulation, pt reports blurred vision upon return to bed. Vitals: 96% room air, 78bpm,  157/86 mmHg (RN notified)   Stairs            Wheelchair Mobility    Modified Rankin (Stroke Patients Only)       Balance                                    Cognition Arousal/Alertness: Awake/alert Behavior During Therapy: WFL for tasks assessed/performed Overall Cognitive Status: Within Functional Limits for tasks assessed                      Exercises      General Comments        Pertinent Vitals/Pain Pain Assessment: 0-10 Pain Score: 7  Pain Location: bil calves Pain Descriptors / Indicators: Tingling;Tightness Pain Intervention(s): Limited activity within patient's tolerance;Monitored during session;Repositioned (RN notified)    Home Living                      Prior Function            PT Goals (current goals can now be found in the care plan section) Progress towards PT goals: Progressing toward goals    Frequency  Min 3X/week    PT Plan Current plan remains appropriate    Co-evaluation             End of Session   Activity Tolerance: Patient tolerated treatment well Patient left: with call bell/phone within reach;in bed     Time: 4098-1191 PT Time Calculation (min) (ACUTE ONLY): 18 min  Charges:  $  Gait Training: 8-22 mins                    G Codes:      Briannie Gutierrez,KATHrine E 06/04/2015, 12:28 PM Zenovia JarredKati Ulysses Alper, PT, DPT 06/04/2015 Pager: [phone]

## 2015-06-05 LAB — BASIC METABOLIC PANEL
Anion gap: 11 (ref 5–15)
BUN: 23 mg/dL — AB (ref 6–20)
CALCIUM: 8.5 mg/dL — AB (ref 8.9–10.3)
CO2: 32 mmol/L (ref 22–32)
Chloride: 98 mmol/L — ABNORMAL LOW (ref 101–111)
Creatinine, Ser: 2.17 mg/dL — ABNORMAL HIGH (ref 0.61–1.24)
GFR calc Af Amer: 40 mL/min — ABNORMAL LOW (ref >60)
GFR, EST NON AFRICAN AMERICAN: 35 mL/min — AB (ref >60)
Glucose, Bld: 158 mg/dL — ABNORMAL HIGH (ref 65–99)
POTASSIUM: 3.2 mmol/L — AB (ref 3.5–5.1)
Sodium: 141 mmol/L (ref 135–145)

## 2015-06-05 LAB — GLUCOSE, CAPILLARY
GLUCOSE-CAPILLARY: 208 mg/dL — AB (ref 65–99)
GLUCOSE-CAPILLARY: 165 mg/dL — AB (ref 65–99)
Glucose-Capillary: 185 mg/dL — ABNORMAL HIGH (ref 65–99)
Glucose-Capillary: 191 mg/dL — ABNORMAL HIGH (ref 65–99)
Glucose-Capillary: 199 mg/dL — ABNORMAL HIGH (ref 65–99)

## 2015-06-05 MED ORDER — PREGABALIN 50 MG PO CAPS
100.0000 mg | ORAL_CAPSULE | Freq: Three times a day (TID) | ORAL | Status: DC
  Administered 2015-06-05 – 2015-06-06 (×4): 100 mg via ORAL
  Filled 2015-06-05 (×4): qty 2

## 2015-06-05 MED ORDER — POTASSIUM CHLORIDE CRYS ER 20 MEQ PO TBCR
40.0000 meq | EXTENDED_RELEASE_TABLET | Freq: Once | ORAL | Status: AC
  Administered 2015-06-05: 40 meq via ORAL
  Filled 2015-06-05: qty 2

## 2015-06-05 MED ORDER — LIP MEDEX EX OINT
TOPICAL_OINTMENT | CUTANEOUS | Status: AC
  Administered 2015-06-05: 12:00:00
  Filled 2015-06-05: qty 7

## 2015-06-05 NOTE — Progress Notes (Signed)
Patient ID: Kenneth Fox, male   DOB: Oct 11, 1970, 44 y.o.   MRN: [id number] [facility] PROGRESS NOTE  Kenneth Rathkehomas E Habib [id number] DOB: Oct 11, 1970 DOA: 06/01/2015 PCP: Mike CrazeLONG,[name], PA-C  Brief narrative:    44 y.o. male with a past medical history significant for IDDM ad diabetic neuropathy, gastroparesis, OSA on CPAP, chronic abdominal pain [facility]. He apparently had few falls in past few days. In addition, he was hypoxic at home for which reason  He came to ED for further evaluation.  In the ED, the patient was found to have an AKI with serum creatinine 5.2 mg/dL, elevated BNP, mildly elevated troponin and elevated d-dimer. The 12 lead EKG showed sinus rhythm. CXR showed no acute cardiopulmonary findings. Perfusion scan showed low probability for PE. Pt was admitted to SDU due to acute renal failure. Nephrology has seen the pt in consultation.  Assessment/Plan:    Principal Problem:   AKI (acute kidney injury) (HCC) - Combination of diabetic nephropathy, janumet and losartan. We placed janumet and losartan on hold - Nephrology consulted - Renal ultrasounds was unremarkable - Cr has improved tremendously since admission, from 5.3 --> 2.17   Active Problems:   Elevated troponin - Likely demand ischemia from AKI - The 12 lead EKG on admission showed sinus rhythm - Trop 0.09 - 2 D ECHO on this admission showed EF of 55 - 60% - No reports of chest pain     Uncontrolled diabetes mellitus with neurologic complication, with long-term current use of insulin (HCC) - A1c is 11.1 indicating poor glycemic control  - CBG's in past 24 hours:210, 158, 150 - Continue levemir 40 units daily along with novolog 6 units TID and SSI    Essential hypertension - Continue hydralazine 50 mg PO TID and Norvasc 5 mg daily    Obstructive sleep apnea - CPAP at night     Diabetic gastroparesis (HCC) - No nausea or vomiting - Tolerates diabetic diet    Hypokalemia - Likely in the setting of acute real failure, supplemented     Leukocytosis - No evidence of acute infectious process     Morbid obesity due to excess calories (HCC) - Body mass index is 47.23 kg/(m^2). - Counseled on diet    DVT Prophylaxis  - SCD's   Code Status: Full.  Family Communication:  plan of care discussed with the patient Disposition Plan: 06/06/2015.  IV access:  Peripheral IV  Procedures and diagnostic studies:    Dg Chest 2 View 06/01/2015 No active cardiopulmonary disease.  [place] Renal 06/02/2015 Unremarkable renal ultrasound. No evidence for hydronephrosis. Evaluation suboptimal due to the patient's habitus; the bladder is not visualized.   Nm Pulmonary Perf And Vent 06/02/2015 Low probability for pulmonary embolus. Ventilation images are somewhat suboptimal.   Medical Consultants:  Nephrology  Other Consultants:  Nutrition DM coordinator   IAnti-Infectives:   None    Manson PasseyEVINE, Raylyn Speckman, MD  [facility] Pager [phone]  Time spent in minutes: 25 minutes  If 7PM-7AM, please contact night-coverage [email or web address] Password [email or web address] 06/05/2015, 7:07 AM   LOS: 4 days    HPI/Subjective: No acute overnight events. No nausea or vomiting.   Objective: Filed Vitals:   06/04/15 0602 06/04/15 1300 06/04/15 2046 06/04/15 2057  BP: 153/76 155/81  165/93  Pulse: 79 78  87  Temp: 98.6 F (37 C) 98.1 F (36.7 C)  98.3 F (36.8 C)  TempSrc: Oral Oral  Oral  Resp: 20 20 20  20  Height:      Weight: 153.543 kg (338 lb 8 oz)     SpO2: 95% 90%  94%    Intake/Output Summary (Last 24 hours) at 06/05/15 0707 Last data filed at 06/05/15 0510  Gross per 24 hour  Intake      0 ml  Output   5025 ml  Net  -5025 ml    Exam:   General:  Pt is obese, awake, alert  Cardiovascular: RRR. appreciate S1, S2   Respiratory: bilateral air entry, no wheezing   Abdomen: non tender,(+) BS  Extremities: No swelling, palpable pulses   Neuro: Nonfocal     Data Reviewed: Basic Metabolic Panel:  Recent Labs Lab 06/01/15 2011 06/02/15 0640 06/03/15 0437 06/04/15 0556 06/05/15 0455  NA 134* 137 137 138 141  K 3.4* 3.1* 3.5 3.1* 3.2*  CL 94* 96* 97* 98* 98*  CO2 32  GLUCOSE 298* 195* 297* 214* 158*  BUN 32* 33* 39* 33* 23*  CREATININE 5.20* 5.38* 4.30* 3.00* 2.17*  CALCIUM 7.9* 8.1* 8.2* 8.1* 8.5*  PHOS  --   --  4.3  --   --    Liver Function Tests:  Recent Labs Lab 06/03/15 0437  ALBUMIN 2.1*   No results for input(s): LIPASE, AMYLASE in the last 168 hours. No results for input(s): AMMONIA in the last 168 hours. CBC:  Recent Labs Lab 06/01/15 2011 06/02/15 0640  WBC 14.1* 14.2*  HGB 12.7* 12.2*  HCT 38.8* 37.7*  MCV 85.8 85.9  PLT 225 233   Cardiac Enzymes:  Recent Labs Lab 06/01/15 2019 06/02/15 0110 06/02/15 0640 06/02/15 1307  TROPONINI 0.10* 0.11* 0.09* 0.06*   BNP: Invalid input(s): POCBNP CBG:  Recent Labs Lab 06/03/15 2150 06/04/15 0754 06/04/15 1209 06/04/15 1703 06/04/15 2140  GLUCAP 229* 211* 210* 158* 150*    Recent Results (from the past 240 hour(s))  MRSA PCR Screening     Status: None   Collection Time: 06/02/15  4:00 AM  Result Value Ref Range Status   MRSA by PCR NEGATIVE NEGATIVE Final     Scheduled Meds: . amLODipine  5 mg Oral Daily  . antiseptic oral rinse  7 mL Mouth Rinse q12n4p  . chlorhexidine  15 mL Mouth Rinse BID  . hydrALAZINE  50 mg Oral TID  . insulin aspart  0-9 Units Subcutaneous TID AC & HS  . insulin aspart  6 Units Subcutaneous TID WC  . insulin detemir  40 Units Subcutaneous Daily  . potassium chloride  40 mEq Oral Once  . sodium chloride  3 mL Intravenous Q12H

## 2015-06-05 NOTE — Progress Notes (Signed)
Pt refusing CPAP for the night.  RT to monitor and assess as needed.  

## 2015-06-05 NOTE — Care Management Note (Signed)
Case Management Note  Patient Details  Name: Lucky Rathkehomas E Baril MRN: [id number] Date of Birth: 02-14-1971  Subjective/Objective: spoke to spouse in rm per patient request-PT-recc HH.Ordered-HHRN/(PT/OT)-not covered by insurance)/aide. [facility] chosen-[facility] rep notified of referral will confirm if able to accept. Spouse will states she does not live w/patient. Spouse aware of HHPT/OT not covered by insurance.[facility] dme rep-Lecretia aware of home rw needed. Await order.                  Action/Plan:d/c plan home w/HHC/DME.   Expected Discharge Date:                  Expected Discharge Plan:  Home w Home Health Services  In-House Referral:     Discharge planning Services  CM Consult  Post Acute Care Choice:    Choice offered to:  Spouse  DME Arranged:  Walker rolling DME Agency:     HH Arranged:  RN, PT, OT, Nurse's Aide HH Agency:  [facility]  Status of Service:  In process, will continue to follow  Medicare Important Message Given:    Date Medicare IM Given:    Medicare IM give by:    Date Additional Medicare IM Given:    Additional Medicare Important Message give by:     If discussed at Long Length of Stay Meetings, dates discussed:    Additional Comments:  Lanier ClamMahabir, Priyana Mccarey, RN 06/05/2015, 1:33 PM

## 2015-06-06 ENCOUNTER — Encounter (HOSPITAL_COMMUNITY): Payer: Self-pay

## 2015-06-06 LAB — BASIC METABOLIC PANEL
ANION GAP: 11 (ref 5–15)
BUN: 17 mg/dL (ref 6–20)
CO2: 33 mmol/L — ABNORMAL HIGH (ref 22–32)
Calcium: 8.7 mg/dL — ABNORMAL LOW (ref 8.9–10.3)
Chloride: 102 mmol/L (ref 101–111)
Creatinine, Ser: 2.01 mg/dL — ABNORMAL HIGH (ref 0.61–1.24)
GFR calc Af Amer: 44 mL/min — ABNORMAL LOW (ref >60)
GFR, EST NON AFRICAN AMERICAN: 38 mL/min — AB (ref >60)
GLUCOSE: 143 mg/dL — AB (ref 65–99)
POTASSIUM: 3.1 mmol/L — AB (ref 3.5–5.1)
Sodium: 146 mmol/L — ABNORMAL HIGH (ref 135–145)

## 2015-06-06 LAB — GLUCOSE, CAPILLARY: GLUCOSE-CAPILLARY: 158 mg/dL — AB (ref 65–99)

## 2015-06-06 MED ORDER — INSULIN ASPART 100 UNIT/ML ~~LOC~~ SOLN: 8.0000 [IU] | Freq: Three times a day (TID) | SUBCUTANEOUS | Status: DC

## 2015-06-06 MED ORDER — INSULIN ASPART 100 UNIT/ML ~~LOC~~ SOLN: 10.0000 [IU] | Freq: Three times a day (TID) | SUBCUTANEOUS | Status: DC

## 2015-06-06 MED ORDER — INSULIN DETEMIR 100 UNIT/ML ~~LOC~~ SOLN: 40.0000 [IU] | Freq: Every day | SUBCUTANEOUS | Status: DC

## 2015-06-06 MED ORDER — POTASSIUM CHLORIDE CRYS ER 20 MEQ PO TBCR
40.0000 meq | EXTENDED_RELEASE_TABLET | Freq: Once | ORAL | Status: AC
  Administered 2015-06-06: 40 meq via ORAL
  Filled 2015-06-06: qty 2

## 2015-06-06 MED ORDER — AMLODIPINE BESYLATE 5 MG PO TABS: 5.0000 mg | ORAL_TABLET | Freq: Every day | ORAL | Status: DC

## 2015-06-06 MED ORDER — HYDRALAZINE HCL 50 MG PO TABS: 50.0000 mg | ORAL_TABLET | Freq: Three times a day (TID) | ORAL | Status: DC

## 2015-06-06 MED ORDER — ONDANSETRON HCL 4 MG PO TABS: 4.0000 mg | ORAL_TABLET | Freq: Four times a day (QID) | ORAL | Status: DC | PRN

## 2015-06-06 NOTE — Discharge Instructions (Signed)
Acute Kidney Injury °Acute kidney injury is any condition in which there is sudden (acute) damage to the kidneys. Acute kidney injury was previously known as acute kidney failure or acute renal failure. The kidneys are two organs that lie on either side of the spine between the middle of the back and the front of the abdomen. The kidneys: °· Remove wastes and extra water from the blood.   °· Produce important hormones. These help keep bones strong, regulate blood pressure, and help create red blood cells.   °· Balance the fluids and chemicals in the blood and tissues. °A small amount of kidney damage may not cause problems, but a large amount of damage may make it difficult or impossible for the kidneys to work the way they should. Acute kidney injury may develop into long-lasting (chronic) kidney disease. It may also develop into a life-threatening disease called end-stage kidney disease. Acute kidney injury can get worse very quickly, so it should be treated right away. Early treatment may prevent other kidney diseases from developing. °CAUSES  °· A problem with blood flow to the kidneys. This may be caused by:   °¨ Blood loss.   °¨ Heart disease.   °¨ Severe burns.   °¨ Liver disease. °· Direct damage to the kidneys. This may be caused by: °¨ Some medicines.   °¨ A kidney infection.   °¨ Poisoning or consuming toxic substances.   °¨ A surgical wound.   °¨ A blow to the kidney area.   °· A problem with urine flow. This may be caused by:   °¨ Cancer.   °¨ Kidney stones.   °¨ An enlarged prostate. °SIGNS AND SYMPTOMS  °· Swelling (edema) of the legs, ankles, or feet.   °· Tiredness (lethargy).   °· Nausea or vomiting.   °· Confusion.   °· Problems with urination, such as:   °¨ Painful or burning feeling during urination.   °¨ Decreased urine production.   °¨ Frequent accidents in children who are potty trained.   °¨ Bloody urine.   °· Muscle twitches and cramps.   °· Shortness of breath.   °· Seizures.   °· Chest  pain or pressure. °Sometimes, no symptoms are present.  °DIAGNOSIS °Acute kidney injury may be detected and diagnosed by tests, including blood, urine, imaging, or kidney biopsy tests.  °TREATMENT °Treatment of acute kidney injury varies depending on the cause and severity of the kidney damage. In mild cases, no treatment may be needed. The kidneys may heal on their own. If acute kidney injury is more severe, your health care provider will treat the cause of the kidney damage, help the kidneys heal, and prevent complications from occurring. Severe cases may require a procedure to remove toxic wastes from the body (dialysis) or surgery to repair kidney damage. Surgery may involve:  °· Repair of a torn kidney.   °· Removal of an obstruction. °HOME CARE INSTRUCTIONS °· Follow your prescribed diet. °· Take medicines only as directed by your health care provider.  °· Do not take any new medicines (prescription, over-the-counter, or nutritional supplements) unless approved by your health care provider. Many medicines can worsen your kidney damage or may need to have the dose adjusted.   °· Keep all follow-up visits as directed by your health care provider. This is important. °· Observe your condition to make sure you are healing as expected. °SEEK IMMEDIATE MEDICAL CARE IF: °· You are feeling ill or have severe pain in the back or side.   °· Your symptoms return or you have new symptoms. °· You have any symptoms of end-stage kidney disease. These include:   °¨ Persistent itchiness.   °¨   Loss of appetite.   °¨ Headaches.   °¨ Abnormally dark or light skin. °¨ Numbness in the hands or feet.   °¨ Easy bruising.   °¨ Frequent hiccups.   °¨ Menstruation stops.   °· You have a fever. °· You have increased urine production. °· You have pain or bleeding when urinating. °MAKE SURE YOU:  °· Understand these instructions. °· Will watch your condition. °· Will get help right away if you are not doing well or get worse. °  °This  information is not intended to replace advice given to you by your health care provider. Make sure you discuss any questions you have with your health care provider. °  °Document Released: 12/08/2010 Document Revised: 06/15/2014 Document Reviewed: 01/22/2012 °Elsevier Interactive Patient Education ©2016 [employer] ° °

## 2015-06-06 NOTE — Discharge Summary (Signed)
Physician Discharge Summary  Kenneth Fox:[id number] DOB: 1971-04-03 DOA: 06/01/2015  PCP: Kenneth Fox  Admit date: 06/01/2015 Discharge date: 06/06/2015  Recommendations for Outpatient Follow-up:  Please follow-up with primary care physician in one week to make sure creatinine and potassium are within normal limits. Potassium was supplemented prior to discharge. Please note your insulin regimen at home is Levemir 40 units daily along with NovoLog 8 units 3 times daily with meals. Then in addition to that you may use insulin sliding scale per scale dosing.  Discharge Diagnoses:  Principal Problem:   AKI (acute kidney injury) (HCC) Active Problems:   Essential hypertension   Obstructive sleep apnea   Diabetic gastroparesis (HCC)   Hypokalemia   Elevated troponin   Leukocytosis   Uncontrolled type 2 diabetes mellitus with diabetic neuropathy, with long-term current use of insulin (HCC)   Morbid obesity due to excess calories (HCC)    Discharge Condition: stable   Diet recommendation: as tolerated   History of present illness:  44 y.o. male with a past medical history significant for IDDM ad diabetic neuropathy, gastroparesis, OSA on CPAP, chronic abdominal pain who presented to [facility] ED with leg weakness. He apparently had few falls in past few days. In addition, he was hypoxic at home for which reason He came to ED for further evaluation.  In the ED, the patient was found to have an AKI with serum creatinine 5.2 mg/dL, elevated BNP, mildly elevated troponin and elevated d-dimer. The 12 lead EKG showed sinus rhythm. CXR showed no acute cardiopulmonary findings. Perfusion scan showed low probability for PE. Pt was admitted to SDU due to acute renal failure. Nephrology has seen the pt in consultation.  Hospital Course:   Assessment/Plan:    Principal Problem:  AKI (acute kidney injury) (HCC) - Likely due to combination of diabetic nephropathy, janumet and  losartan. We placed janumet and losartan on hold in hospital and on discharge  - Nephrology consulted - Renal ultrasounds was unremarkable - Cr has improved tremendously since admission, from 5.3 --> 2.17 --> 2.0   Active Problems:  Elevated troponin - Likely demand ischemia from AKI - The 12 lead EKG on admission showed sinus rhythm - Trop 0.09 - No chest pain  - 2 D ECHO on this admission showed EF of 55 - 60%   Uncontrolled diabetes mellitus with neurologic complication, with long-term current use of insulin (HCC) - A1c is 11.1 indicating poor glycemic control  - Continue levemir 40 units daily along with novolog 8 units TID and SSI on discharge  - Continue to hold janumet    Essential hypertension - Continue hydralazine 50 mg PO TID and Norvasc 5 mg daily   Obstructive sleep apnea - CPAP at night    Diabetic gastroparesis (HCC) - No nausea or vomiting - Continue Reglan as needed    Hypokalemia - Likely in the setting of acute real failure, supplemented    Leukocytosis - No evidence of acute infectious process    Morbid obesity due to excess calories (HCC) - Body mass index is 47.23 kg/(m^2). - Counseled on diet   DVT Prophylaxis  - SCD's in hospital   Code Status: Full.  Family Communication: plan of care discussed with the patient   IV access:  Peripheral IV  Procedures and diagnostic studies:   Dg Chest 2 View 06/01/2015 No active cardiopulmonary disease.  US Renal 06/02/2015 Unremarkable renal ultrasound. No evidence for hydronephrosis. Evaluation suboptimal due to the patient's habitus; the bladder is  not visualized.   Nm Pulmonary Perf And Vent 06/02/2015 Low probability for pulmonary embolus. Ventilation images are somewhat suboptimal.   Medical Consultants:  Nephrology  Other Consultants:  Nutrition DM coordinator   IAnti-Infectives:   None   Signed:  Manson PasseyEVINE, Anyela Napierkowski, MD  [facility] 06/06/2015, 9:47  AM  Pager #: [phone]  Time spent in minutes: more than 30 minutes    Discharge Exam: Filed Vitals:   06/05/15 2127 06/06/15 0441  BP: 168/97 177/90  Pulse: 80 89  Temp: 98.2 F (36.8 C) 98.2 F (36.8 C)  Resp: 22 22   Filed Vitals:   06/05/15 1604 06/05/15 2127 06/06/15 0441 06/06/15 0442  BP: 152/86 168/97 177/90   Pulse: 74 80 89   Temp:  98.2 F (36.8 C) 98.2 F (36.8 C)   TempSrc:  Oral Oral   Resp:  22 22   Height:    5\' 11"  (1.803 m)  Weight:    324 lb 8.3 oz (147.2 kg)  SpO2: 99% 100% 94%     General: Pt is alert, follows commands appropriately, not in acute distress Cardiovascular: Regular rate and rhythm, S1/S2 appreciated  Respiratory: Clear to auscultation bilaterally, no wheezing, no crackles, no rhonchi Abdominal: Soft, non tender, non distended, bowel sounds +, no guarding Extremities: no edema, no cyanosis, pulses palpable bilaterally DP and PT Neuro: Grossly nonfocal  Discharge Instructions  Discharge Instructions    Ambulatory referral to Nutrition and Diabetic Education    Complete by:  As directed   A1c 11.1%.  Sees Dr. Talmage CoinJeffrey Kerr for Endocrinology.  Needs 1:1 session to help with carbohydrate counting at home.  Still in hospital as of 12/27.     Call MD for:  difficulty breathing, headache or visual disturbances    Complete by:  As directed      Call MD for:  persistant nausea and vomiting    Complete by:  As directed      Call MD for:  redness, tenderness, or signs of infection (pain, swelling, redness, odor or green/yellow discharge around incision site)    Complete by:  As directed      Call MD for:  severe uncontrolled pain    Complete by:  As directed      Diet - low sodium heart healthy    Complete by:  As directed      Discharge instructions    Complete by:  As directed   Please follow-up with primary care physician in one week to make sure creatinine and potassium are within normal limits. Potassium was supplemented prior to  discharge. Please note your insulin regimen at home is Levemir 40 units daily along with NovoLog 8 units 3 times daily with meals. Then in addition to that you may use insulin sliding scale per scale dosing.     Increase activity slowly    Complete by:  As directed             Medication List    STOP taking these medications        JANUMET XR [id number] MG Tb24  Generic drug:  SitaGLIPtin-MetFORMIN HCl     LEVEMIR FLEXTOUCH 100 UNIT/ML Pen  Generic drug:  Insulin Detemir  Replaced by:  insulin detemir 100 UNIT/ML injection     losartan 50 MG tablet  Commonly known as:  COZAAR      TAKE these medications        amLODipine 5 MG tablet  Commonly known as:  NORVASC  Take 1 tablet (5 mg total) by mouth daily.     dicyclomine 20 MG tablet  Commonly known as:  BENTYL  Take 1 tablet (20 mg total) by mouth 2 (two) times daily.     hydrALAZINE 50 MG tablet  Commonly known as:  APRESOLINE  Take 1 tablet (50 mg total) by mouth 3 (three) times daily.     insulin aspart 100 UNIT/ML injection  Commonly known as:  novoLOG  Inject 10-20 Units into the skin 3 (three) times daily with meals. per sliding scale CBG 70 - 120: 0 units CBG 121 - 150: 2 units CBG 151 - 200: 3 units CBG 201 - 250: 5 units CBG 251 - 300: 8 units CBG 301 - 350: 11 units CBG 351 - 400: 15 units     insulin aspart 100 UNIT/ML injection  Commonly known as:  novoLOG  Inject 8 Units into the skin 3 (three) times daily with meals.     insulin detemir 100 UNIT/ML injection  Commonly known as:  LEVEMIR  Inject 0.4 mLs (40 Units total) into the skin daily.     LYRICA 100 MG capsule  Generic drug:  pregabalin  Take 100 mg by mouth 3 (three) times daily.     metoCLOPramide 10 MG tablet  Commonly known as:  REGLAN  Take 1-2 tablets (10-20 mg total) by mouth every 6 (six) hours as needed for nausea or vomiting.     ondansetron 4 MG tablet  Commonly known as:  ZOFRAN  Take 1 tablet (4 mg total) by mouth every 6 (six)  hours as needed for nausea.     Oxycodone HCl 10 MG Tabs  Take 1 tablet by mouth every 6 (six) hours as needed (pain).     polyethylene glycol packet  Commonly known as:  MIRALAX / GLYCOLAX  Take 17 g by mouth daily as needed for mild constipation.           Follow-up Information    Follow up with Cecille Aver, MD On 07/17/2015.   Specialty:  Nephrology   Why:  Wednesday Feb 8th arrive at 10:00 am for follow up with Dr Kathrene Bongo (kidney doctor)    Contact information:   [address] [place] [address] [phone]       Follow up with LONG,[name], PA-C. Schedule an appointment as soon as possible for a visit in 1 week.   Specialty:  Physician Assistant   Why:  Follow up appt after recent hospitalization   Contact information:   [address] [facility] [address] [phone]        The results of significant diagnostics from this hospitalization (including imaging, microbiology, ancillary and laboratory) are listed below for reference.    Significant Diagnostic Studies: Dg Chest 2 View  06/01/2015  CLINICAL DATA:  44 year old male with shortness of breath, weakness, and hypoxia EXAM: CHEST  2 VIEW COMPARISON:  Radiograph dated 01/17/2015 FINDINGS: Two views of the chest demonstrate linear atelectasis/scarring involving the right lower lung field. No focal consolidation, pleural effusion, or pneumothorax. Stable cardiomegaly. The osseous structures appear unremarkable. IMPRESSION: No active cardiopulmonary disease. Electronically Signed   By: Elgie Collard M.D.   On: 06/01/2015 20:57   US Renal  06/02/2015  CLINICAL DATA:  Acute onset of renal failure.  Initial encounter. EXAM: RENAL / URINARY TRACT ULTRASOUND COMPLETE COMPARISON:  CT of the abdomen and pelvis from 03/10/2014 FINDINGS: Right Kidney: Length: 11.6 cm. Echogenicity within normal limits. No mass or hydronephrosis  visualized. Left Kidney: Length: 11.2 cm. Echogenicity within normal limits.  No mass or hydronephrosis visualized. Bladder: Not visualized on this study. Evaluation is suboptimal due to the patient's habitus. IMPRESSION: Unremarkable renal ultrasound. No evidence for hydronephrosis. Evaluation suboptimal due to the patient's habitus; the bladder is not visualized. Electronically Signed   By: Roanna Raider M.D.   On: 06/02/2015 00:03   Nm Pulmonary Perf And Vent  06/02/2015  CLINICAL DATA:  Acute onset of shortness of breath. Initial encounter. EXAM: NUCLEAR MEDICINE VENTILATION - PERFUSION LUNG SCAN TECHNIQUE: Ventilation images were obtained in multiple projections using inhaled aerosol Tc-39m DTPA. Perfusion images were obtained in multiple projections after intravenous injection of Tc-48m MAA. RADIOPHARMACEUTICALS:  27.7 mCi Technetium-31m DTPA aerosol inhalation and 4.1 mCi Technetium-5m MAA IV COMPARISON:  Chest radiograph performed earlier today at 8:46 p.m. FINDINGS: Ventilation: Residual activity is noted within the airways, artifactual in nature. Ventilation images are somewhat suboptimal. No significant focal ventilation defects are seen. Perfusion: A few small subsegmental perfusion defects are suggested within both lungs. These are nonspecific, without significant segmental filling defect. IMPRESSION: Low probability for pulmonary embolus. Ventilation images are somewhat suboptimal. Electronically Signed   By: Roanna Raider M.D.   On: 06/02/2015 02:32   Dg Abd 2 Views  05/23/2015  CLINICAL DATA:  Abdominal pain and vomiting today. EXAM: ABDOMEN - 2 VIEW COMPARISON:  01/17/2015 FINDINGS: Examination is technically limited due to body habitus. There is stool and gas throughout the colon. No small or large bowel distention. No free intra-abdominal air. No abnormal air-fluid levels. Postoperative changes with plate and screw fixation of the right hemipelvis. Several of the screws are not flush with the plate but this appearance is unchanged since previous study. Calcified  phleboliths in the pelvis. No radiopaque stones identified. IMPRESSION: Limited study due to patient body habitus. Nonobstructive bowel gas pattern. Electronically Signed   By: Burman Nieves M.D.   On: 05/23/2015 23:12    Microbiology: Recent Results (from the past 240 hour(s))  MRSA PCR Screening     Status: None   Collection Time: 06/02/15  4:00 AM  Result Value Ref Range Status   MRSA by PCR NEGATIVE NEGATIVE Final    Comment:        The GeneXpert MRSA Assay (FDA approved for NASAL specimens only), is one component of a comprehensive MRSA colonization surveillance program. It is not intended to diagnose MRSA infection nor to guide or monitor treatment for MRSA infections.      Labs: Basic Metabolic Panel:  Recent Labs Lab 06/02/15 0640 06/03/15 0437 06/04/15 0556 06/05/15 0455 06/06/15 0510  NA 137 137 138 141 146*  K 3.1* 3.5 3.1* 3.2* 3.1*  CL 96* 97* 98* 98* 102  CO2 30 30 31  32 33*  GLUCOSE 195* 297* 214* 158* 143*  BUN 33* 39* 33* 23* 17  CREATININE 5.38* 4.30* 3.00* 2.17* 2.01*  CALCIUM 8.1* 8.2* 8.1* 8.5* 8.7*  PHOS  --  4.3  --   --   --    Liver Function Tests:  Recent Labs Lab 06/03/15 0437  ALBUMIN 2.1*   No results for input(s): LIPASE, AMYLASE in the last 168 hours. No results for input(s): AMMONIA in the last 168 hours. CBC:  Recent Labs Lab 06/01/15 2011 06/02/15 0640  WBC 14.1* 14.2*  HGB 12.7* 12.2*  HCT 38.8* 37.7*  MCV 85.8 85.9  PLT 225 233   Cardiac Enzymes:  Recent Labs Lab 06/01/15 2019 06/02/15 0110 06/02/15 0640 06/02/15 1307  TROPONINI 0.10* 0.11* 0.09* 0.06*   BNP: BNP (last 3 results)  Recent Labs  06/01/15 2011  BNP 750.7*    ProBNP (last 3 results) No results for input(s): PROBNP in the last 8760 hours.  CBG:  Recent Labs Lab 06/05/15 1058 06/05/15 1155 06/05/15 1659 06/05/15 2130 06/06/15 0728  GLUCAP 208* 191* 199* 165* 158*

## 2015-06-06 NOTE — Care Management Note (Signed)
Case Management Note  Patient Details  Name: Lucky Rathkehomas E Reece MRN: [id number] Date of Birth: Oct 06, 1970  Subjective/Objective:                    Action/Plan:d/c home w/HHC/home rw.   Expected Discharge Date:                  Expected Discharge Plan:  Home w Home Health Services  In-House Referral:     Discharge planning Services  CM Consult  Post Acute Care Choice:    Choice offered to:  Spouse  DME Arranged:  [facility] rolling DME Agency:     HH Arranged:  RN, PT, OT, Nurse's Aide HH Agency:  [facility]  Status of Service:  Completed, signed off  Medicare Important Message Given:    Date Medicare IM Given:    Medicare IM give by:    Date Additional Medicare IM Given:    Additional Medicare Important Message give by:     If discussed at Long Length of Stay Meetings, dates discussed:    Additional Comments:  Lanier ClamMahabir, Brighton Pilley, RN 06/06/2015, 12:46 PM

## 2015-06-27 ENCOUNTER — Encounter (HOSPITAL_COMMUNITY): Payer: Self-pay | Admitting: Emergency Medicine

## 2015-06-27 ENCOUNTER — Emergency Department (HOSPITAL_COMMUNITY)
Admission: EM | Admit: 2015-06-27 | Discharge: 2015-06-27 | Disposition: A | Discharge status: Home / Self Care | Payer: Medicaid Other | Attending: Emergency Medicine | Admitting: Emergency Medicine

## 2015-06-27 ENCOUNTER — Emergency Department (HOSPITAL_COMMUNITY): Payer: Medicaid Other

## 2015-06-27 DIAGNOSIS — J45901 Unspecified asthma with (acute) exacerbation: Secondary | ICD-10-CM | POA: Insufficient documentation

## 2015-06-27 DIAGNOSIS — K219 Gastro-esophageal reflux disease without esophagitis: Secondary | ICD-10-CM | POA: Diagnosis not present

## 2015-06-27 DIAGNOSIS — M7989 Other specified soft tissue disorders: Secondary | ICD-10-CM | POA: Insufficient documentation

## 2015-06-27 DIAGNOSIS — Z79899 Other long term (current) drug therapy: Secondary | ICD-10-CM | POA: Diagnosis not present

## 2015-06-27 DIAGNOSIS — Z794 Long term (current) use of insulin: Secondary | ICD-10-CM | POA: Diagnosis not present

## 2015-06-27 DIAGNOSIS — R0602 Shortness of breath: Secondary | ICD-10-CM | POA: Diagnosis present

## 2015-06-27 DIAGNOSIS — I10 Essential (primary) hypertension: Secondary | ICD-10-CM | POA: Insufficient documentation

## 2015-06-27 DIAGNOSIS — Z862 Personal history of diseases of the blood and blood-forming organs and certain disorders involving the immune mechanism: Secondary | ICD-10-CM | POA: Insufficient documentation

## 2015-06-27 DIAGNOSIS — G4730 Sleep apnea, unspecified: Secondary | ICD-10-CM | POA: Diagnosis not present

## 2015-06-27 DIAGNOSIS — E119 Type 2 diabetes mellitus without complications: Secondary | ICD-10-CM | POA: Diagnosis not present

## 2015-06-27 DIAGNOSIS — E669 Obesity, unspecified: Secondary | ICD-10-CM | POA: Insufficient documentation

## 2015-06-27 LAB — I-STAT VENOUS BLOOD GAS, ED
Acid-Base Excess: 2 mmol/L (ref 0.0–2.0)
Bicarbonate: 29.4 mEq/L — ABNORMAL HIGH (ref 20.0–24.0)
O2 Saturation: 71 %
PCO2 VEN: 60 mmHg — AB (ref 45.0–50.0)
PH VEN: 7.299 (ref 7.250–7.300)
TCO2: 31 mmol/L (ref 0–100)
pO2, Ven: 42 mmHg (ref 30.0–45.0)

## 2015-06-27 LAB — CBC
HCT: 38.2 % — ABNORMAL LOW (ref 39.0–52.0)
HEMOGLOBIN: 12.2 g/dL — AB (ref 13.0–17.0)
MCH: 27.3 pg (ref 26.0–34.0)
MCHC: 31.9 g/dL (ref 30.0–36.0)
MCV: 85.5 fL (ref 78.0–100.0)
Platelets: 232 10*3/uL (ref 150–400)
RBC: 4.47 MIL/uL (ref 4.22–5.81)
RDW: 15.2 % (ref 11.5–15.5)
WBC: 8.6 10*3/uL (ref 4.0–10.5)

## 2015-06-27 LAB — I-STAT ARTERIAL BLOOD GAS, ED
Acid-Base Excess: 4 mmol/L — ABNORMAL HIGH (ref 0.0–2.0)
BICARBONATE: 30.9 meq/L — AB (ref 20.0–24.0)
O2 Saturation: 82 %
PCO2 ART: 54.8 mmHg — AB (ref 35.0–45.0)
PH ART: 7.359 (ref 7.350–7.450)
PO2 ART: 49 mmHg — AB (ref 80.0–100.0)
TCO2: 33 mmol/L (ref 0–100)

## 2015-06-27 LAB — DIFFERENTIAL
BASOS ABS: 0 10*3/uL (ref 0.0–0.1)
Basophils Relative: 0 %
EOS ABS: 0.2 10*3/uL (ref 0.0–0.7)
Eosinophils Relative: 2 %
LYMPHS ABS: 1.7 10*3/uL (ref 0.7–4.0)
LYMPHS PCT: 19 %
Monocytes Absolute: 0.9 10*3/uL (ref 0.1–1.0)
Monocytes Relative: 10 %
NEUTROS PCT: 69 %
Neutro Abs: 5.9 10*3/uL (ref 1.7–7.7)

## 2015-06-27 LAB — BASIC METABOLIC PANEL
ANION GAP: 5 (ref 5–15)
BUN: 8 mg/dL (ref 6–20)
CALCIUM: 8.4 mg/dL — AB (ref 8.9–10.3)
CO2: 31 mmol/L (ref 22–32)
Chloride: 106 mmol/L (ref 101–111)
Creatinine, Ser: 1.39 mg/dL — ABNORMAL HIGH (ref 0.61–1.24)
GFR calc non Af Amer: 59 mL/min — ABNORMAL LOW (ref >60)
Glucose, Bld: 129 mg/dL — ABNORMAL HIGH (ref 65–99)
Potassium: 3.5 mmol/L (ref 3.5–5.1)
SODIUM: 142 mmol/L (ref 135–145)

## 2015-06-27 LAB — I-STAT TROPONIN, ED: TROPONIN I, POC: 0.02 ng/mL (ref 0.00–0.08)

## 2015-06-27 LAB — BRAIN NATRIURETIC PEPTIDE: B Natriuretic Peptide: 121.2 pg/mL — ABNORMAL HIGH (ref 0.0–100.0)

## 2015-06-27 NOTE — ED Notes (Signed)
Per ems- Pt from home worsening shortness of breath and bilaterally leg swelling and weight gain in the last 2 weeks. pts 02 sats on room air 83%. Pt placed on simple face mask. Pt has labored respirations.

## 2015-06-27 NOTE — ED Notes (Signed)
MD at bedside.Joni Fears

## 2015-06-27 NOTE — ED Provider Notes (Addendum)
CSN: [id number]     Arrival date & time 06/27/15  1137 History   First MD Initiated Contact with Patient 06/27/15 1137     Chief Complaint  Patient presents with  . Shortness of Breath  . Leg Swelling     (Consider location/radiation/quality/duration/timing/severity/associated sxs/prior Treatment) HPI 45 year old male who presents with shortness of breath. History of hypertension, obesity, severe with a, asthma, diabetes and fibromyalgia. Was recently hospitalized in mid December for evaluation of shortness of breath and hypoxia. Was found to be in acute renal failure and noted to have mild pulmonary hypertension and mild right heart failure weekly due to obesity and severe OSA. States that he was discharged in good condition, and has noted that over the past 2 days has had intermittent dyspnea, primarily. States that he wears his CPAP at night, but feels stuffy overnight so removed it. Fell asleep on his couch with CPAP. States that today was first day for visiting nurse, and she checked his pulse-ox and it was 82-83% on room air while he was sleeping. Called EMS subsequently. States that on arrival, he feels he is back to baseline  Has baseline orthopnea on 5 pillows. That has not been changed. He continues to have lower extremity edema and reports that over the past 2 month has had a 15 pound weight gain, which has been stable since his last admission. Denies any chest pain, and has had mild cough, congestion, runny nose over the past week.   Past Medical History  Diagnosis Date  . Sickle cell trait (HCC)   . Diabetes (HCC)   . Obstructive sleep apnea     will not wear CPAP  . Asthma   . GERD (gastroesophageal reflux disease)    Past Surgical History  Procedure Laterality Date  . Hip fracture surgery Right 1999    "put a plate in"   Family History  Problem Relation Age of Onset  . Diabetes Father   . Neuropathy Father   . Hypertension Father   . Sickle cell anemia Mother   .  Kidney disease Brother   . Kidney disease Sister   . Aneurysm Father   . Heart attack Mother    Social History  Substance Use Topics  . Smoking status: Never Smoker   . Smokeless tobacco: Never Used  . Alcohol Use: No    Review of Systems 10/14 systems reviewed and are negative other than those stated in the HPI    Allergies  Angiotensin receptor blockers and Lisinopril  Home Medications   Prior to Admission medications   Medication Sig Start Date End Date Taking? Authorizing Provider  amLODipine (NORVASC) 5 MG tablet Take 1 tablet (5 mg total) by mouth daily. 06/06/15  Yes Alison Murray, MD  dicyclomine (BENTYL) 20 MG tablet Take 1 tablet (20 mg total) by mouth 2 (two) times daily. 05/29/15  Yes Benjamin Cartner, PA-C  insulin aspart (NOVOLOG) 100 UNIT/ML injection Inject 10-20 Units into the skin 3 (three) times daily with meals. per sliding scale CBG 70 - 120: 0 units CBG 121 - 150: 2 units CBG 151 - 200: 3 units CBG 201 - 250: 5 units CBG 251 - 300: 8 units CBG 301 - 350: 11 units CBG 351 - 400: 15 units 06/06/15  Yes Alison Murray, MD  insulin detemir (LEVEMIR) 100 UNIT/ML injection Inject 0.4 mLs (40 Units total) into the skin daily. Patient taking differently: Inject 80 Units into the skin daily.  06/06/15  Yes Alma  Concepcion Elk, MD  LYRICA 100 MG capsule Take 100 mg by mouth 3 (three) times daily. 05/02/15  Yes Historical Provider, MD  ondansetron (ZOFRAN) 4 MG tablet Take 1 tablet (4 mg total) by mouth every 6 (six) hours as needed for nausea. 06/06/15  Yes Alison Murray, MD  Oxycodone HCl 10 MG TABS Take 1 tablet by mouth every 6 (six) hours as needed (pain).  04/17/14  Yes Historical Provider, MD  hydrALAZINE (APRESOLINE) 50 MG tablet Take 1 tablet (50 mg total) by mouth 3 (three) times daily. Patient not taking: Reported on 06/27/2015 06/06/15   Alison Murray, MD  insulin aspart (NOVOLOG) 100 UNIT/ML injection Inject 8 Units into the skin 3 (three) times daily with  meals. 06/06/15   Alison Murray, MD  metoCLOPramide (REGLAN) 10 MG tablet Take 1-2 tablets (10-20 mg total) by mouth every 6 (six) hours as needed for nausea or vomiting. 05/29/15   Joycie Peek, PA-C   BP 134/107 mmHg  Pulse 85  Temp(Src) 98.1 F (36.7 C) (Oral)  Resp 16  Ht  (1.676 m)  Wt 350 lb (158.759 kg)  BMI 56.52 kg/m2  SpO2 91% Physical Exam Physical Exam  Nursing note and vitals reviewed. Constitutional: Morbidly obese, in moderate respiratory distress Head: Normocephalic and atraumatic.  Mouth/Throat: Oropharynx is clear and moist.  Neck: Normal range of motion. Neck supple.  Cardiovascular: Normal rate and regular rhythm.  +2 pitting edema bilaterally Pulmonary/Chest: Tachypnea, but improved with oxygen. Lungs overall clear to auscultation.  Abdominal: Soft. There is no tenderness. There is no rebound and no guarding.  Musculoskeletal: Normal range of motion.  Neurological: Sleepy but arouses to voice, no facial droop, fluent speech, moves all extremities symmetrically Skin: Skin is warm and dry.  Psychiatric: Cooperative  ED Course  Procedures (including critical care time) Labs Review Labs Reviewed  CBC - Abnormal; Notable for the following:    Hemoglobin 12.2 (*)    HCT 38.2 (*)    All other components within normal limits  BRAIN NATRIURETIC PEPTIDE - Abnormal; Notable for the following:    B Natriuretic Peptide 121.2 (*)    All other components within normal limits  BASIC METABOLIC PANEL - Abnormal; Notable for the following:    Glucose, Bld 129 (*)    Creatinine, Ser 1.39 (*)    Calcium 8.4 (*)    GFR calc non Af Amer 59 (*)    All other components within normal limits  I-STAT VENOUS BLOOD GAS, ED - Abnormal; Notable for the following:    pCO2, Ven 60.0 (*)    Bicarbonate 29.4 (*)    All other components within normal limits  I-STAT ARTERIAL BLOOD GAS, ED - Abnormal; Notable for the following:    pCO2 arterial 54.8 (*)    pO2, Arterial 49.0  (*)    Bicarbonate 30.9 (*)    Acid-Base Excess 4.0 (*)    All other components within normal limits  DIFFERENTIAL  BLOOD GAS, VENOUS  BLOOD GAS, ARTERIAL  I-STAT TROPOININ, ED    Imaging Review Dg Chest 2 View  06/27/2015  CLINICAL DATA:  Worsening shortness of breath for 2 days, sleep apnea EXAM: CHEST  2 VIEW COMPARISON:  06/01/2015 FINDINGS: Cardiomegaly again noted. Study is limited by poor inspiration and patient's large body habitus. Mild perihilar bronchitic changes. Streaky bilateral basilar atelectasis or infiltrate right greater than left. IMPRESSION: Limited study by poor inspiration and patient's large body habitus. Mild perihilar bronchitic changes. Streaky bilateral basilar  atelectasis or infiltrate right greater than left. Electronically Signed   By: Natasha Mead M.D.   On: 06/27/2015 12:36   I have personally reviewed and evaluated these images and lab results as part of my medical decision-making.   EKG Interpretation   Date/Time:  Thursday June 27 2015 11:52:40 EST Ventricular Rate:  83 PR Interval:  168 QRS Duration: 82 QT Interval:  437 QTC Calculation: 513 R Axis:   108 Text Interpretation:  Sinus rhythm Probable anterior infarct, old  Prolonged QT interval No significant change since last tracing Confirmed  by Nimrit Kehres MD, Seanmichael Salmons [phone]) on 06/27/2015 1:06:19 PM      MDM   Final diagnoses:  Shortness of breath  Severe sleep apnea    45 year old male with history of hypertension, obesity, asthma and severe OSA who presents with shortness of breath upon waking from sleep today. He is on a nonrebreather on presentation, with normal work of breathing and normal oxygenation. States that his symptoms this morning had seemed to resolve. Under observation in the ED, he is noted to have frequent desaturations when he is asleep or napping. When he is awake and converse and, he is oxygenating normally on room air with normal work of breathing and no conversational dyspnea.  This seems more consistent with his underlying sleep apnea,  and he has CPAP at home. States that this is typical for him to have these symptoms when not wearing CPAP, and he otherwise would not have come to ED if not urged by his visiting nurse today.    An ABG performed here shows chronic respiratory acidosis with metabolic compensation. Chest x-ray does not show evidence of edema or other major acute cardiopulmonary processes. Questionable infiltrate versus atelectasis, but he is afebrile, no leukocytosis, and no major symptoms suggestive of a pneumonia. His BNP is not elevated, and he just recently had an echocardiogram last month showing normal EF on presentation unlikely due to acute CHF exacerbation. EKG is non-ischemic and troponin is negative. On his last admission 3 weeks ago, he also had a PE evaluation that was low probability on VQ scan for the same symptoms. Presentation today does not seem consistent with that of PE, ACS, or other serious etiology, and more consistent with this severe sleep apnea. Renal function significantly improved from recent hospitalization, and he is no issues urinating and  no worsening fluid retention compared to what he normally has. He has one day follow-up with his primary care doctor tomorrow for reevaluation, and pulmonary and nephrology evaluations in one week. Strict return and follow-up instructions are reviewed. He expressed understanding of all discharge instructions and felt comfortable with the plan of care.     Lavera Guise, MD 06/27/15 1653  Lavera Guise, MD 06/27/15 [phone]

## 2015-06-27 NOTE — ED Notes (Signed)
MD at bedside.liu

## 2015-06-27 NOTE — ED Notes (Signed)
Pt transported to xray 

## 2015-06-27 NOTE — Discharge Instructions (Signed)
Please where your sleep apnea machine whenever you go to sleep. Return without fail for worsening symptoms including chest pain, difficulty breathing, or any other symptoms concerning to you. Please see your primary care doctor tomorrow as scheduled. Continue to also follow up with your pulmonologist and nephrologist as scheduled.  Sleep Apnea Sleep apnea is disorder that affects a person's sleep. A person with sleep apnea has abnormal pauses in their breathing when they sleep. It is hard for them to get a good sleep. This makes a person tired during the day. It also can lead to other physical problems. There are three types of sleep apnea. One type is when breathing stops for a short time because your airway is blocked (obstructive sleep apnea). Another type is when the brain sometimes fails to give the normal signal to breathe to the muscles that control your breathing (central sleep apnea). The third type is a combination of the other two types. HOME CARE  Do not sleep on your back. Try to sleep on your side.  Take all medicine as told by your doctor.  Avoid alcohol, calming medicines (sedatives), and depressant drugs.  Try to lose weight if you are overweight. Talk to your doctor about a healthy weight goal. Your doctor may have you use a device that helps to open your airway. It can help you get the air that you need. It is called a positive airway pressure (PAP) device. There are three types of PAP devices:  Continuous positive airway pressure (CPAP) device.  Nasal expiratory positive airway pressure (EPAP) device.  Bilevel positive airway pressure (BPAP) device. MAKE SURE YOU:  Understand these instructions.  Will watch your condition.  Will get help right away if you are not doing well or get worse.   This information is not intended to replace advice given to you by your health care provider. Make sure you discuss any questions you have with your health care provider.     Document Released: 03/03/2008 Document Revised: 06/15/2014 Document Reviewed: 09/26/2011 Elsevier Interactive Patient Education [employer]

## 2015-06-27 NOTE — ED Notes (Signed)
Pt able to ambulate down the hallway. 02 sats 90%.

## 2015-07-26 ENCOUNTER — Emergency Department (HOSPITAL_COMMUNITY)
Admission: EM | Admit: 2015-07-26 | Discharge: 2015-07-26 | Disposition: A | Discharge status: Home / Self Care | Payer: Medicare Other | Attending: Emergency Medicine | Admitting: Emergency Medicine

## 2015-07-26 ENCOUNTER — Emergency Department (HOSPITAL_COMMUNITY): Payer: Medicare Other

## 2015-07-26 ENCOUNTER — Encounter (HOSPITAL_COMMUNITY): Payer: Self-pay | Admitting: Emergency Medicine

## 2015-07-26 DIAGNOSIS — J45909 Unspecified asthma, uncomplicated: Secondary | ICD-10-CM | POA: Diagnosis not present

## 2015-07-26 DIAGNOSIS — Z7984 Long term (current) use of oral hypoglycemic drugs: Secondary | ICD-10-CM | POA: Diagnosis not present

## 2015-07-26 DIAGNOSIS — E669 Obesity, unspecified: Secondary | ICD-10-CM | POA: Insufficient documentation

## 2015-07-26 DIAGNOSIS — Z8669 Personal history of other diseases of the nervous system and sense organs: Secondary | ICD-10-CM | POA: Insufficient documentation

## 2015-07-26 DIAGNOSIS — Z794 Long term (current) use of insulin: Secondary | ICD-10-CM | POA: Diagnosis not present

## 2015-07-26 DIAGNOSIS — E119 Type 2 diabetes mellitus without complications: Secondary | ICD-10-CM | POA: Insufficient documentation

## 2015-07-26 DIAGNOSIS — Z862 Personal history of diseases of the blood and blood-forming organs and certain disorders involving the immune mechanism: Secondary | ICD-10-CM | POA: Insufficient documentation

## 2015-07-26 DIAGNOSIS — R600 Localized edema: Secondary | ICD-10-CM | POA: Insufficient documentation

## 2015-07-26 DIAGNOSIS — R112 Nausea with vomiting, unspecified: Secondary | ICD-10-CM | POA: Diagnosis present

## 2015-07-26 DIAGNOSIS — K3184 Gastroparesis: Secondary | ICD-10-CM | POA: Insufficient documentation

## 2015-07-26 DIAGNOSIS — Z79899 Other long term (current) drug therapy: Secondary | ICD-10-CM | POA: Diagnosis not present

## 2015-07-26 LAB — CBC WITH DIFFERENTIAL/PLATELET
BASOS PCT: 0 %
Basophils Absolute: 0 10*3/uL (ref 0.0–0.1)
EOS ABS: 0 10*3/uL (ref 0.0–0.7)
Eosinophils Relative: 1 %
HEMATOCRIT: 40.6 % (ref 39.0–52.0)
HEMOGLOBIN: 13.2 g/dL (ref 13.0–17.0)
LYMPHS ABS: 1.2 10*3/uL (ref 0.7–4.0)
Lymphocytes Relative: 14 %
MCH: 26.9 pg (ref 26.0–34.0)
MCHC: 32.5 g/dL (ref 30.0–36.0)
MCV: 82.7 fL (ref 78.0–100.0)
MONOS PCT: 7 %
Monocytes Absolute: 0.6 10*3/uL (ref 0.1–1.0)
NEUTROS ABS: 6.7 10*3/uL (ref 1.7–7.7)
NEUTROS PCT: 78 %
Platelets: 255 10*3/uL (ref 150–400)
RBC: 4.91 MIL/uL (ref 4.22–5.81)
RDW: 14.5 % (ref 11.5–15.5)
WBC: 8.6 10*3/uL (ref 4.0–10.5)

## 2015-07-26 LAB — COMPREHENSIVE METABOLIC PANEL
ALBUMIN: 2.5 g/dL — AB (ref 3.5–5.0)
ALT: 16 U/L — ABNORMAL LOW (ref 17–63)
ANION GAP: 8 (ref 5–15)
AST: 18 U/L (ref 15–41)
Alkaline Phosphatase: 96 U/L (ref 38–126)
BUN: 10 mg/dL (ref 6–20)
CALCIUM: 8.8 mg/dL — AB (ref 8.9–10.3)
CO2: 26 mmol/L (ref 22–32)
Chloride: 108 mmol/L (ref 101–111)
Creatinine, Ser: 1.41 mg/dL — ABNORMAL HIGH (ref 0.61–1.24)
GFR calc non Af Amer: 58 mL/min — ABNORMAL LOW (ref >60)
GLUCOSE: 202 mg/dL — AB (ref 65–99)
POTASSIUM: 3.1 mmol/L — AB (ref 3.5–5.1)
SODIUM: 142 mmol/L (ref 135–145)
Total Bilirubin: 0.4 mg/dL (ref 0.3–1.2)
Total Protein: 6.4 g/dL — ABNORMAL LOW (ref 6.5–8.1)

## 2015-07-26 LAB — URINALYSIS, ROUTINE W REFLEX MICROSCOPIC
Bilirubin Urine: NEGATIVE
Glucose, UA: 250 mg/dL — AB
Hgb urine dipstick: NEGATIVE
Ketones, ur: NEGATIVE mg/dL
Leukocytes, UA: NEGATIVE
NITRITE: NEGATIVE
PH: 6.5 (ref 5.0–8.0)
Protein, ur: >300 mg/dL — AB
SPECIFIC GRAVITY, URINE: 1.017 (ref 1.005–1.030)

## 2015-07-26 LAB — I-STAT VENOUS BLOOD GAS, ED
Acid-Base Excess: 4 mmol/L — ABNORMAL HIGH (ref 0.0–2.0)
BICARBONATE: 30.2 meq/L — AB (ref 20.0–24.0)
O2 SAT: 89 %
PCO2 VEN: 50.8 mmHg — AB (ref 45.0–50.0)
PH VEN: 7.382 — AB (ref 7.250–7.300)
TCO2: 32 mmol/L (ref 0–100)
pO2, Ven: 59 mmHg — ABNORMAL HIGH (ref 30.0–45.0)

## 2015-07-26 LAB — URINE MICROSCOPIC-ADD ON: RBC / HPF: NONE SEEN RBC/hpf (ref 0–5)

## 2015-07-26 LAB — LIPASE, BLOOD: Lipase: 37 U/L (ref 11–51)

## 2015-07-26 MED ORDER — SODIUM CHLORIDE 0.9 % IV SOLN: INTRAVENOUS | Status: DC

## 2015-07-26 MED ORDER — METOCLOPRAMIDE HCL 5 MG/ML IJ SOLN
10.0000 mg | Freq: Once | INTRAMUSCULAR | Status: AC
  Administered 2015-07-26: 10 mg via INTRAVENOUS
  Filled 2015-07-26: qty 2

## 2015-07-26 MED ORDER — SODIUM CHLORIDE 0.9 % IV BOLUS (SEPSIS)
1000.0000 mL | Freq: Once | INTRAVENOUS | Status: AC
  Administered 2015-07-26: 1000 mL via INTRAVENOUS

## 2015-07-26 MED ORDER — ONDANSETRON 8 MG PO TBDP: 8.0000 mg | ORAL_TABLET | Freq: Three times a day (TID) | ORAL | Status: DC | PRN

## 2015-07-26 MED ORDER — LORAZEPAM 2 MG/ML IJ SOLN
1.0000 mg | Freq: Once | INTRAMUSCULAR | Status: AC
  Administered 2015-07-26: 1 mg via INTRAVENOUS
  Filled 2015-07-26: qty 1

## 2015-07-26 NOTE — ED Provider Notes (Signed)
CSN: [id number]     Arrival date & time 07/26/15  1739 History   First MD Initiated Contact with Patient 07/26/15 1743     Chief Complaint  Patient presents with  . Nausea  . Emesis  . Abdominal Pain   HPI Patient presents to the emergency room with complaints of nausea and vomiting. The patient has a history of diabetes and gastroparesis. He has had recurrent episodes of abdominal pain nausea and vomiting that he associates with this illness. Patient states he started having trouble with nausea a few days ago. He ran out of his Zofran so he did not have anything available. He started developing sharp pain in his upper abdomen associated with multiple episodes of nausea and vomiting. He has not been able to be well but has been able to keep down some liquids. Whenever he vomits he will feel better but then the symptoms will return again. Patient called EMS today and was given 50 g of fentanyl and 4 mg of Zofran. He denies any trouble with fevers or chills. No dysuria. He denies any prior abdominal surgery. Past Medical History  Diagnosis Date  . Sickle cell trait (HCC)   . Diabetes (HCC)   . Obstructive sleep apnea     will not wear CPAP  . Asthma   . GERD (gastroesophageal reflux disease)    Past Surgical History  Procedure Laterality Date  . Hip fracture surgery Right 1999    "put a plate in"   Family History  Problem Relation Age of Onset  . Diabetes Father   . Neuropathy Father   . Hypertension Father   . Sickle cell anemia Mother   . Kidney disease Brother   . Kidney disease Sister   . Aneurysm Father   . Heart attack Mother    Social History  Substance Use Topics  . Smoking status: Never Smoker   . Smokeless tobacco: Never Used  . Alcohol Use: No    Review of Systems  All other systems reviewed and are negative.     Allergies  Angiotensin receptor blockers and Lisinopril  Home Medications   Prior to Admission medications   Medication Sig Start Date End  Date Taking? Authorizing Provider  amLODipine (NORVASC) 5 MG tablet Take 1 tablet (5 mg total) by mouth daily. 06/06/15  Yes Alison Murray, MD  dicyclomine (BENTYL) 20 MG tablet Take 1 tablet (20 mg total) by mouth 2 (two) times daily. 05/29/15  Yes Benjamin Cartner, PA-C  insulin aspart (NOVOLOG) 100 UNIT/ML injection Inject 10-20 Units into the skin 3 (three) times daily with meals. per sliding scale CBG 70 - 120: 0 units CBG 121 - 150: 2 units CBG 151 - 200: 3 units CBG 201 - 250: 5 units CBG 251 - 300: 8 units CBG 301 - 350: 11 units CBG 351 - 400: 15 units 06/06/15  Yes Alison Murray, MD  insulin detemir (LEVEMIR) 100 UNIT/ML injection Inject 0.4 mLs (40 Units total) into the skin daily. Patient taking differently: Inject 80 Units into the skin daily.  06/06/15  Yes Alison Murray, MD  LYRICA 100 MG capsule Take 100 mg by mouth 3 (three) times daily. 05/02/15  Yes Historical Provider, MD  ondansetron (ZOFRAN) 4 MG tablet Take 1 tablet (4 mg total) by mouth every 6 (six) hours as needed for nausea. 06/06/15  Yes Alison Murray, MD  Oxycodone HCl 10 MG TABS Take 1 tablet by mouth every 6 (six) hours as needed (  pain).  04/17/14  Yes Historical Provider, MD  hydrALAZINE (APRESOLINE) 50 MG tablet Take 1 tablet (50 mg total) by mouth 3 (three) times daily. Patient not taking: Reported on 06/27/2015 06/06/15   Alison Murray, MD  insulin aspart (NOVOLOG) 100 UNIT/ML injection Inject 8 Units into the skin 3 (three) times daily with meals. Patient not taking: Reported on 07/26/2015 06/06/15   Alison Murray, MD  metoCLOPramide (REGLAN) 10 MG tablet Take 1-2 tablets (10-20 mg total) by mouth every 6 (six) hours as needed for nausea or vomiting. Patient not taking: Reported on 07/26/2015 05/29/15   Joycie Peek, PA-C  ondansetron (ZOFRAN ODT) 8 MG disintegrating tablet Take 1 tablet (8 mg total) by mouth every 8 (eight) hours as needed for nausea or vomiting. 07/26/15   Linwood Dibbles, MD   BP 170/90 mmHg   Pulse 93  Temp(Src) 98.2 F (36.8 C) (Oral)  Ht  (1.803 m)  Wt 148.326 kg  BMI 45.63 kg/m2  SpO2 84% Physical Exam  Constitutional: No distress.  Obese  HENT:  Head: Normocephalic and atraumatic.  Right Ear: External ear normal.  Left Ear: External ear normal.  Eyes: Conjunctivae are normal. Right eye exhibits no discharge. Left eye exhibits no discharge. No scleral icterus.  Neck: Neck supple. No tracheal deviation present.  Cardiovascular: Normal rate, regular rhythm and intact distal pulses.   Pulmonary/Chest: Effort normal and breath sounds normal. No stridor. No respiratory distress. He has no wheezes. He has no rales.  Abdominal: Soft. Bowel sounds are normal. He exhibits no distension. There is tenderness. There is no rebound and no guarding.  Mild tenderness to palpation in the epigastric region, no rebound or guarding, no mass  Musculoskeletal: He exhibits edema. He exhibits no tenderness.  Neurological: He is alert. He has normal strength. No cranial nerve deficit (no facial droop, extraocular movements intact, no slurred speech) or sensory deficit. He exhibits normal muscle tone. He displays no seizure activity. Coordination normal.  Skin: Skin is warm and dry. No rash noted. He is not diaphoretic.  Psychiatric: He has a normal mood and affect.  Nursing note and vitals reviewed.   ED Course  Procedures (including critical care time) Labs Review Labs Reviewed  COMPREHENSIVE METABOLIC PANEL - Abnormal; Notable for the following:    Potassium 3.1 (*)    Glucose, Bld 202 (*)    Creatinine, Ser 1.41 (*)    Calcium 8.8 (*)    Total Protein 6.4 (*)    Albumin 2.5 (*)    ALT 16 (*)    GFR calc non Af Amer 58 (*)    All other components within normal limits  URINALYSIS, ROUTINE W REFLEX MICROSCOPIC (NOT AT [facility]) - Abnormal; Notable for the following:    APPearance HAZY (*)    Glucose, UA 250 (*)    Protein, ur >300 (*)    All other components within normal limits   URINE MICROSCOPIC-ADD ON - Abnormal; Notable for the following:    Squamous Epithelial / LPF 0-5 (*)    Bacteria, UA RARE (*)    Casts HYALINE CASTS (*)    All other components within normal limits  I-STAT VENOUS BLOOD GAS, ED - Abnormal; Notable for the following:    pH, Ven 7.382 (*)    pCO2, Ven 50.8 (*)    pO2, Ven 59.0 (*)    Bicarbonate 30.2 (*)    Acid-Base Excess 4.0 (*)    All other components within normal limits  LIPASE,  BLOOD  CBC WITH DIFFERENTIAL/PLATELET    Imaging Review Dg Abd Acute W/chest  07/26/2015  CLINICAL DATA:  45 year old male with acute onset nausea vomiting and epigastric pain EXAM: DG ABDOMEN ACUTE W/ 1V CHEST COMPARISON:  Prior chest x-ray 06/27/2015 FINDINGS: Stable borderline cardiomegaly. Chronic disc like atelectasis versus scarring in the right lung base is similar compared to prior imaging. No pulmonary edema, focal airspace consolidation, pleural effusion or pneumothorax. The bowel gas pattern is not obstructed. No large free air. Prior repair of right acetabular fracture. No evidence of hardware complication. No organomegaly or suspicious calcifications. IMPRESSION: 1. Stable chest x-ray without evidence of acute cardiopulmonary process. 2. Unremarkable common non obstructed bowel gas pattern. Electronically Signed   By: Malachy Moan M.D.   On: 07/26/2015 19:27   I have personally reviewed and evaluated these images and lab results as part of my medical decision-making.  MDM   Final diagnoses:  Gastroparesis    Patient's laboratory tests are reassuring. No evidence of dehydration or DKA.  X-rays do not show evidence of bowel obstruction\  Patient's symptoms improved with treatment in the emergency room. He was able to tolerate fluids any crackers.  I suspect his symptoms are related to his recurrent gastroparesis. Plan on discharge home with a prescription for Zofran. Follow up with his primary care doctor.    Linwood Dibbles, MD 07/26/15  2131

## 2015-07-26 NOTE — ED Notes (Signed)
Pt attempting to use urinal at bedside; pt also given ginger ale and crackers for PO challenge

## 2015-07-26 NOTE — Discharge Instructions (Signed)
Gastroparesis °Gastroparesis, also called delayed gastric emptying, is a condition in which food takes longer than normal to empty from the stomach. The condition is usually long-lasting (chronic). °CAUSES °This condition may be caused by: °· An endocrine disorder, such as hypothyroidism or diabetes. Diabetes is the most common cause of this condition. °· A nervous system disease, such as Parkinson disease or multiple sclerosis. °· Cancer, infection, or surgery of the stomach or vagus nerve. °· A connective tissue disorder, such as scleroderma. °· Certain medicines. °In most cases, the cause is not known. °RISK FACTORS °This condition is more likely to develop in: °· People with certain disorders, including endocrine disorders, eating disorders, amyloidosis, and scleroderma. °· People with certain diseases, including Parkinson disease or multiple sclerosis. °· People with cancer or infection of the stomach or vagus nerve. °· People who have had surgery on the stomach or vagus nerve. °· People who take certain medicines. °· Women. °SYMPTOMS °Symptoms of this condition include: °· An early feeling of fullness when eating. °· Nausea. °· Weight loss. °· Vomiting. °· Heartburn. °· Abdominal bloating. °· Inconsistent blood glucose levels. °· Lack of appetite. °· Acid from the stomach coming up into the esophagus (gastroesophageal reflux). °· Spasms of the stomach. °Symptoms may come and go. °DIAGNOSIS °This condition is diagnosed with tests, such as: °· Tests that check how long it takes food to move through the stomach and intestines. These tests include: °¨ Upper gastrointestinal (GI) series. In this test, X-rays of the intestines are taken after you drink a liquid. The liquid makes the intestines show up better on the X-rays. °¨ Gastric emptying scintigraphy. In this test, scans are taken after you eat food that contains a small amount of radioactive material. °¨ Wireless capsule GI monitoring system. This test  involves swallowing a capsule that records information about movement through the stomach. °· Gastric manometry. This test measures electrical and muscular activity in the stomach. It is done with a thin tube that is passed down the throat and into the stomach. °· Endoscopy. This test checks for abnormalities in the lining of the stomach. It is done with a long, thin tube that is passed down the throat and into the stomach. °· An ultrasound. This test can help rule out gallbladder disease or pancreatitis as a cause of your symptoms. It uses sound waves to take pictures of the inside of your body. °TREATMENT °There is no cure for gastroparesis. This condition may be managed with: °· Treatment of the underlying condition causing the gastroparesis. °· Lifestyle changes, including exercise and dietary changes. Dietary changes can include: °¨ Changes in what and when you eat. °¨ Eating smaller meals more often. °¨ Eating low-fat foods. °¨ Eating low-fiber forms of high-fiber foods, such as cooked vegetables instead of raw vegetables. °¨ Having liquid foods in place of solid foods. Liquid foods are easier to digest. °· Medicines. These may be given to control nausea and vomiting and to stimulate stomach muscles. °· Getting food through a feeding tube. This may be done in severe cases. °· A gastric neurostimulator. This is a device that is inserted into the body with surgery. It helps improve stomach emptying and control nausea and vomiting. °HOME CARE INSTRUCTIONS °· Follow your health care provider's instructions about exercise and diet. °· Take medicines only as directed by your health care provider. °SEEK MEDICAL CARE IF: °· Your symptoms do not improve with treatment. °· You have new symptoms. °SEEK IMMEDIATE MEDICAL CARE IF: °· You have   severe abdominal pain that does not improve with treatment. °· You have nausea that does not go away. °· You cannot keep fluids down. °  °This information is not intended to replace  advice given to you by your health care provider. Make sure you discuss any questions you have with your health care provider. °  °Document Released: 05/25/2005 Document Revised: 10/09/2014 Document Reviewed: 05/21/2014 °Elsevier Interactive Patient Education ©2016 [employer] ° °

## 2015-07-26 NOTE — ED Notes (Signed)
Pt arrived via [facility]. Pt woke up with n/v, s/s x3 days, and worsened epigastric pain. Unable to keep food down, drank only tea today. Clear, viscous emesis output reported from EMS. Had fentanyl 50 mcg,  Zofran.

## 2015-07-27 ENCOUNTER — Emergency Department (HOSPITAL_COMMUNITY)
Admission: EM | Admit: 2015-07-27 | Discharge: 2015-07-27 | Discharge status: Against Medical Advice | Payer: Medicare Other | Attending: Emergency Medicine | Admitting: Emergency Medicine

## 2015-07-27 ENCOUNTER — Encounter (HOSPITAL_COMMUNITY): Payer: Self-pay | Admitting: Emergency Medicine

## 2015-07-27 ENCOUNTER — Emergency Department (HOSPITAL_COMMUNITY)
Admission: EM | Admit: 2015-07-27 | Discharge: 2015-07-27 | Disposition: A | Discharge status: Home / Self Care | Payer: Medicare Other | Source: Home / Self Care | Attending: Emergency Medicine | Admitting: Emergency Medicine

## 2015-07-27 ENCOUNTER — Encounter (HOSPITAL_COMMUNITY): Payer: Self-pay | Admitting: *Deleted

## 2015-07-27 DIAGNOSIS — R1012 Left upper quadrant pain: Secondary | ICD-10-CM | POA: Insufficient documentation

## 2015-07-27 DIAGNOSIS — Z79899 Other long term (current) drug therapy: Secondary | ICD-10-CM | POA: Insufficient documentation

## 2015-07-27 DIAGNOSIS — K3184 Gastroparesis: Secondary | ICD-10-CM

## 2015-07-27 DIAGNOSIS — Z8669 Personal history of other diseases of the nervous system and sense organs: Secondary | ICD-10-CM

## 2015-07-27 DIAGNOSIS — Z8719 Personal history of other diseases of the digestive system: Secondary | ICD-10-CM | POA: Insufficient documentation

## 2015-07-27 DIAGNOSIS — E119 Type 2 diabetes mellitus without complications: Secondary | ICD-10-CM | POA: Insufficient documentation

## 2015-07-27 DIAGNOSIS — Z862 Personal history of diseases of the blood and blood-forming organs and certain disorders involving the immune mechanism: Secondary | ICD-10-CM | POA: Insufficient documentation

## 2015-07-27 DIAGNOSIS — F419 Anxiety disorder, unspecified: Secondary | ICD-10-CM | POA: Diagnosis not present

## 2015-07-27 DIAGNOSIS — E1143 Type 2 diabetes mellitus with diabetic autonomic (poly)neuropathy: Secondary | ICD-10-CM | POA: Insufficient documentation

## 2015-07-27 DIAGNOSIS — Z794 Long term (current) use of insulin: Secondary | ICD-10-CM

## 2015-07-27 DIAGNOSIS — R451 Restlessness and agitation: Secondary | ICD-10-CM | POA: Insufficient documentation

## 2015-07-27 DIAGNOSIS — R110 Nausea: Secondary | ICD-10-CM

## 2015-07-27 DIAGNOSIS — J45909 Unspecified asthma, uncomplicated: Secondary | ICD-10-CM | POA: Insufficient documentation

## 2015-07-27 DIAGNOSIS — R079 Chest pain, unspecified: Secondary | ICD-10-CM | POA: Diagnosis present

## 2015-07-27 LAB — URINALYSIS, ROUTINE W REFLEX MICROSCOPIC
BILIRUBIN URINE: NEGATIVE
GLUCOSE, UA: 250 mg/dL — AB
KETONES UR: NEGATIVE mg/dL
Leukocytes, UA: NEGATIVE
Nitrite: NEGATIVE
Protein, ur: >300 mg/dL — AB
SPECIFIC GRAVITY, URINE: 1.013 (ref 1.005–1.030)
pH: 7 (ref 5.0–8.0)

## 2015-07-27 LAB — CBC WITH DIFFERENTIAL/PLATELET
BASOS ABS: 0 10*3/uL (ref 0.0–0.1)
BASOS PCT: 0 %
EOS ABS: 0 10*3/uL (ref 0.0–0.7)
EOS PCT: 0 %
HCT: 41.9 % (ref 39.0–52.0)
Hemoglobin: 13.5 g/dL (ref 13.0–17.0)
LYMPHS PCT: 12 %
Lymphs Abs: 1.2 10*3/uL (ref 0.7–4.0)
MCH: 26.7 pg (ref 26.0–34.0)
MCHC: 32.2 g/dL (ref 30.0–36.0)
MCV: 82.8 fL (ref 78.0–100.0)
Monocytes Absolute: 0.5 10*3/uL (ref 0.1–1.0)
Monocytes Relative: 5 %
Neutro Abs: 8 10*3/uL — ABNORMAL HIGH (ref 1.7–7.7)
Neutrophils Relative %: 83 %
PLATELETS: 248 10*3/uL (ref 150–400)
RBC: 5.06 MIL/uL (ref 4.22–5.81)
RDW: 14.5 % (ref 11.5–15.5)
WBC: 9.7 10*3/uL (ref 4.0–10.5)

## 2015-07-27 LAB — RAPID URINE DRUG SCREEN, HOSP PERFORMED
AMPHETAMINES: NOT DETECTED
BARBITURATES: NOT DETECTED
BENZODIAZEPINES: NOT DETECTED
COCAINE: NOT DETECTED
Opiates: NOT DETECTED
Tetrahydrocannabinol: NOT DETECTED

## 2015-07-27 LAB — COMPREHENSIVE METABOLIC PANEL
ALBUMIN: 2.4 g/dL — AB (ref 3.5–5.0)
ALT: 16 U/L — AB (ref 17–63)
AST: 17 U/L (ref 15–41)
Alkaline Phosphatase: 96 U/L (ref 38–126)
Anion gap: 10 (ref 5–15)
BUN: 8 mg/dL (ref 6–20)
CHLORIDE: 101 mmol/L (ref 101–111)
CO2: 25 mmol/L (ref 22–32)
CREATININE: 1.42 mg/dL — AB (ref 0.61–1.24)
Calcium: 8.4 mg/dL — ABNORMAL LOW (ref 8.9–10.3)
GFR calc Af Amer: >60 mL/min (ref >60)
GFR calc non Af Amer: 58 mL/min — ABNORMAL LOW (ref >60)
Glucose, Bld: 285 mg/dL — ABNORMAL HIGH (ref 65–99)
Potassium: 3.2 mmol/L — ABNORMAL LOW (ref 3.5–5.1)
SODIUM: 136 mmol/L (ref 135–145)
Total Bilirubin: 0.4 mg/dL (ref 0.3–1.2)
Total Protein: 6.4 g/dL — ABNORMAL LOW (ref 6.5–8.1)

## 2015-07-27 LAB — URINE MICROSCOPIC-ADD ON

## 2015-07-27 LAB — LIPASE, BLOOD: Lipase: 69 U/L — ABNORMAL HIGH (ref 11–51)

## 2015-07-27 LAB — I-STAT TROPONIN, ED: TROPONIN I, POC: 0 ng/mL (ref 0.00–0.08)

## 2015-07-27 LAB — I-STAT CG4 LACTIC ACID, ED: Lactic Acid, Venous: 1.08 mmol/L (ref 0.5–2.0)

## 2015-07-27 MED ORDER — HALOPERIDOL LACTATE 5 MG/ML IJ SOLN
5.0000 mg | Freq: Once | INTRAMUSCULAR | Status: DC
  Filled 2015-07-27: qty 1

## 2015-07-27 MED ORDER — ONDANSETRON 4 MG PO TBDP
8.0000 mg | ORAL_TABLET | Freq: Once | ORAL | Status: AC
  Administered 2015-07-27: 8 mg via ORAL
  Filled 2015-07-27: qty 2

## 2015-07-27 MED ORDER — SODIUM CHLORIDE 0.9 % IV BOLUS (SEPSIS): 1000.0000 mL | Freq: Once | INTRAVENOUS | Status: DC

## 2015-07-27 NOTE — ED Notes (Signed)
Pt making loud retching noises multiple times a minute when no medical staff is in the room. When RN or PA enters, retching stops.

## 2015-07-27 NOTE — ED Notes (Signed)
PA at bedside.

## 2015-07-27 NOTE — ED Notes (Signed)
Pt is in stable condition upon d/c and is escorted from ED via wheelchair. 

## 2015-07-27 NOTE — ED Notes (Signed)
Pt left AMA °

## 2015-07-27 NOTE — Discharge Instructions (Signed)
I have reviewed your labs and x-ray from yesterday, they are normal. Fill your prescriptions from last night which should help with your nausea. Follow-up with your primary care physician. Return here for new concerns.

## 2015-07-27 NOTE — ED Notes (Signed)
Pt disconnected from monitoring equipment and laying on stomach sleeping.

## 2015-07-27 NOTE — ED Notes (Signed)
Pt arrives via EMS for LUQ abdominal pain ongoing x3 days. Seen yesterday by Dr. Lynelle Doctor and did not get his prescriptions filled. Said the pharmacy as closed.

## 2015-07-27 NOTE — ED Provider Notes (Signed)
CSN: [id number]     Arrival date & time 07/27/15  4098 History   First MD Initiated Contact with Patient 07/27/15 0559     Chief Complaint  Patient presents with  . Abdominal Pain     (Consider location/radiation/quality/duration/timing/severity/associated sxs/prior Treatment) The history is provided by the patient.    45 year old male with history of sickle cell trait, diabetes, sleep apnea, asthma, GERD, presenting to the ED for continued abdominal pain. Patient was seen late yesterday evening around 2130.  He had a negative workup and was discharged home with supportive care. He states he went to [facility] to fill his prescriptions, however they were closed. He states he came back because he did not have any medications to take at home. He denies any new symptoms. No fever or chills.  VSS.  Past Medical History  Diagnosis Date  . Sickle cell trait (HCC)   . Diabetes (HCC)   . Obstructive sleep apnea     will not wear CPAP  . Asthma   . GERD (gastroesophageal reflux disease)    Past Surgical History  Procedure Laterality Date  . Hip fracture surgery Right 1999    "put a plate in"   Family History  Problem Relation Age of Onset  . Diabetes Father   . Neuropathy Father   . Hypertension Father   . Sickle cell anemia Mother   . Kidney disease Brother   . Kidney disease Sister   . Aneurysm Father   . Heart attack Mother    Social History  Substance Use Topics  . Smoking status: Never Smoker   . Smokeless tobacco: Never Used  . Alcohol Use: No    Review of Systems  Gastrointestinal: Positive for nausea and abdominal pain.  All other systems reviewed and are negative.     Allergies  Angiotensin receptor blockers and Lisinopril  Home Medications   Prior to Admission medications   Medication Sig Start Date End Date Taking? Authorizing Provider  amLODipine (NORVASC) 5 MG tablet Take 1 tablet (5 mg total) by mouth daily. 06/06/15   Alison Murray, MD  dicyclomine  (BENTYL) 20 MG tablet Take 1 tablet (20 mg total) by mouth 2 (two) times daily. 05/29/15   Joycie Peek, PA-C  hydrALAZINE (APRESOLINE) 50 MG tablet Take 1 tablet (50 mg total) by mouth 3 (three) times daily. Patient not taking: Reported on 06/27/2015 06/06/15   Alison Murray, MD  insulin aspart (NOVOLOG) 100 UNIT/ML injection Inject 10-20 Units into the skin 3 (three) times daily with meals. per sliding scale CBG 70 - 120: 0 units CBG 121 - 150: 2 units CBG 151 - 200: 3 units CBG 201 - 250: 5 units CBG 251 - 300: 8 units CBG 301 - 350: 11 units CBG 351 - 400: 15 units 06/06/15   Alison Murray, MD  insulin aspart (NOVOLOG) 100 UNIT/ML injection Inject 8 Units into the skin 3 (three) times daily with meals. Patient not taking: Reported on 07/26/2015 06/06/15   Alison Murray, MD  insulin detemir (LEVEMIR) 100 UNIT/ML injection Inject 0.4 mLs (40 Units total) into the skin daily. Patient taking differently: Inject 80 Units into the skin daily.  06/06/15   Alison Murray, MD  LYRICA 100 MG capsule Take 100 mg by mouth 3 (three) times daily. 05/02/15   Historical Provider, MD  metoCLOPramide (REGLAN) 10 MG tablet Take 1-2 tablets (10-20 mg total) by mouth every 6 (six) hours as needed for nausea or vomiting.  Patient not taking: Reported on 07/26/2015 05/29/15   Joycie Peek, PA-C  ondansetron (ZOFRAN ODT) 8 MG disintegrating tablet Take 1 tablet (8 mg total) by mouth every 8 (eight) hours as needed for nausea or vomiting. 07/26/15   Linwood Dibbles, MD  ondansetron (ZOFRAN) 4 MG tablet Take 1 tablet (4 mg total) by mouth every 6 (six) hours as needed for nausea. 06/06/15   Alison Murray, MD  Oxycodone HCl 10 MG TABS Take 1 tablet by mouth every 6 (six) hours as needed (pain).  04/17/14   Historical Provider, MD   BP 190/121 mmHg  Pulse 93  Temp(Src) 97.7 F (36.5 C) (Oral)  Resp 18  SpO2 94%   Physical Exam  Constitutional: He is oriented to person, place, and time. He appears well-developed  and well-nourished. No distress.  Moaning loudly and making retching noises in room with emesis bag in face, no active vomiting  HENT:  Head: Normocephalic and atraumatic.  Mouth/Throat: Oropharynx is clear and moist.  Eyes: Conjunctivae and EOM are normal. Pupils are equal, round, and reactive to light.  Neck: Normal range of motion. Neck supple.  Cardiovascular: Normal rate, regular rhythm and normal heart sounds.   Pulmonary/Chest: Effort normal and breath sounds normal. No respiratory distress. He has no wheezes.  Abdominal: There is no tenderness. There is no rigidity, no guarding and no CVA tenderness.  Obese abdomen, points to his left upper abdomen as source of point, not overtly tender to palpation  Musculoskeletal: Normal range of motion. He exhibits no edema.  Neurological: He is alert and oriented to person, place, and time.  Skin: Skin is warm and dry. He is not diaphoretic.  Psychiatric: He has a normal mood and affect.  Nursing note and vitals reviewed.   ED Course  Procedures (including critical care time) Labs Review Labs Reviewed - No data to display  Imaging Review Dg Abd Acute W/chest  07/26/2015  CLINICAL DATA:  45 year old male with acute onset nausea vomiting and epigastric pain EXAM: DG ABDOMEN ACUTE W/ 1V CHEST COMPARISON:  Prior chest x-ray 06/27/2015 FINDINGS: Stable borderline cardiomegaly. Chronic disc like atelectasis versus scarring in the right lung base is similar compared to prior imaging. No pulmonary edema, focal airspace consolidation, pleural effusion or pneumothorax. The bowel gas pattern is not obstructed. No large free air. Prior repair of right acetabular fracture. No evidence of hardware complication. No organomegaly or suspicious calcifications. IMPRESSION: 1. Stable chest x-ray without evidence of acute cardiopulmonary process. 2. Unremarkable common non obstructed bowel gas pattern. Electronically Signed   By: Malachy Moan M.D.   On:  07/26/2015 19:27   I have personally reviewed and evaluated these images and lab results as part of my medical decision-making.   EKG Interpretation None      MDM   Final diagnoses:  Left upper quadrant pain   45 year old male here with abdominal pain. He was evaluated last night for the same with negative workup. Patient reports he is here today is he could not fill his prescriptions. Patient is afebrile, nontoxic. On exam he is making loud, retching noises with emesis bag in front of his face, however he is not actively vomiting. He is very uncooperative with exam and answers limited questions. He points to his left upper abdomen as the source of pain, however no overt tenderness on exam. Patient was given dose of Zofran. I have reviewed his labs and x-ray from yesterday which are only about 8 hours old at this time,  no acute findings to suggest acute abdominal pathology or DKA.  I agree that this is likely patient's recurrent gastroparesis.  After zofran here patient has calmed, now lying in bed in no distress.  He has not had any active vomiting in the ED.  He is repeatedly asking for pain medication, do not feel narcotics indicated currently.  Patient d/c home in stable condition, encouraged to fill scripts from yesterday and follow-up with PCP.  Discussed plan with patient, he/she acknowledged understanding and agreed with plan of care.  Return precautions given for new or worsening symptoms.  Garlon Hatchet, PA-C 07/27/15 1610  Shon Baton, MD 07/27/15 2255

## 2015-07-27 NOTE — ED Notes (Addendum)
Pt continues to request meds and making noises as if he is going to vomit. Pt still has had no episodes of vomiting. Pt continues to take off monitoring equipment.

## 2015-07-27 NOTE — ED Notes (Signed)
Pt requesting pain meds and zofran. Pt still has had no episodes of vomiting.

## 2015-07-27 NOTE — ED Provider Notes (Signed)
CSN: [id number]     Arrival date & time 07/27/15  7829 History   First MD Initiated Contact with Patient 07/27/15 [phone]     Chief Complaint  Patient presents with  . Chest Pain     (Consider location/radiation/quality/duration/timing/severity/associated sxs/prior Treatment) Patient is a 45 y.o. male presenting with vomiting. The history is provided by the patient.  Emesis Severity:  Moderate Duration:  1 day Timing:  Constant Quality:  Stomach contents Progression:  Unchanged Chronicity:  Chronic (for "years" per wife) Relieved by:  Nothing Worsened by:  Nothing tried Ineffective treatments:  Antiemetics Associated symptoms: abdominal pain   Associated symptoms comment:  Chest pain with retching, nontender Risk factors: diabetes     Past Medical History  Diagnosis Date  . Sickle cell trait (HCC)   . Diabetes (HCC)   . Obstructive sleep apnea     will not wear CPAP  . Asthma   . GERD (gastroesophageal reflux disease)    Past Surgical History  Procedure Laterality Date  . Hip fracture surgery Right 1999    "put a plate in"   Family History  Problem Relation Age of Onset  . Diabetes Father   . Neuropathy Father   . Hypertension Father   . Sickle cell anemia Mother   . Kidney disease Brother   . Kidney disease Sister   . Aneurysm Father   . Heart attack Mother    Social History  Substance Use Topics  . Smoking status: Never Smoker   . Smokeless tobacco: Never Used  . Alcohol Use: No    Review of Systems  Gastrointestinal: Positive for vomiting and abdominal pain.  All other systems reviewed and are negative.     Allergies  Angiotensin receptor blockers and Lisinopril  Home Medications   Prior to Admission medications   Medication Sig Start Date End Date Taking? Authorizing Provider  amLODipine (NORVASC) 5 MG tablet Take 1 tablet (5 mg total) by mouth daily. 06/06/15   Alison Murray, MD  dicyclomine (BENTYL) 20 MG tablet Take 1 tablet (20 mg total)  by mouth 2 (two) times daily. 05/29/15   Joycie Peek, PA-C  hydrALAZINE (APRESOLINE) 50 MG tablet Take 1 tablet (50 mg total) by mouth 3 (three) times daily. Patient not taking: Reported on 06/27/2015 06/06/15   Alison Murray, MD  insulin aspart (NOVOLOG) 100 UNIT/ML injection Inject 10-20 Units into the skin 3 (three) times daily with meals. per sliding scale CBG 70 - 120: 0 units CBG 121 - 150: 2 units CBG 151 - 200: 3 units CBG 201 - 250: 5 units CBG 251 - 300: 8 units CBG 301 - 350: 11 units CBG 351 - 400: 15 units 06/06/15   Alison Murray, MD  insulin aspart (NOVOLOG) 100 UNIT/ML injection Inject 8 Units into the skin 3 (three) times daily with meals. Patient not taking: Reported on 07/26/2015 06/06/15   Alison Murray, MD  insulin detemir (LEVEMIR) 100 UNIT/ML injection Inject 0.4 mLs (40 Units total) into the skin daily. Patient taking differently: Inject 80 Units into the skin daily.  06/06/15   Alison Murray, MD  LYRICA 100 MG capsule Take 100 mg by mouth 3 (three) times daily. 05/02/15   Historical Provider, MD  metoCLOPramide (REGLAN) 10 MG tablet Take 1-2 tablets (10-20 mg total) by mouth every 6 (six) hours as needed for nausea or vomiting. Patient not taking: Reported on 07/26/2015 05/29/15   Joycie Peek, PA-C  ondansetron (ZOFRAN ODT) 8 MG  disintegrating tablet Take 1 tablet (8 mg total) by mouth every 8 (eight) hours as needed for nausea or vomiting. 07/26/15   Linwood Dibbles, MD  ondansetron (ZOFRAN) 4 MG tablet Take 1 tablet (4 mg total) by mouth every 6 (six) hours as needed for nausea. 06/06/15   Alison Murray, MD  Oxycodone HCl 10 MG TABS Take 1 tablet by mouth every 6 (six) hours as needed (pain).  04/17/14   Historical Provider, MD   BP 171/99 mmHg  Pulse 87  Temp(Src) 97.7 F (36.5 C) (Oral)  Resp 25  Ht 5\' 11"  (1.803 m)  Wt 327 lb (148.326 kg)  BMI 45.63 kg/m2  SpO2 93% Physical Exam  Constitutional: He is oriented to person, place, and time. He appears  well-developed and well-nourished. No distress.  HENT:  Head: Normocephalic and atraumatic.  Eyes: Conjunctivae are normal.  Neck: Neck supple. No tracheal deviation present.  Cardiovascular: Normal rate, regular rhythm and normal heart sounds.   Pulmonary/Chest: Effort normal and breath sounds normal. No respiratory distress.  Abdominal: Soft. He exhibits no distension. There is tenderness (mild diffuse).  Neurological: He is alert and oriented to person, place, and time. GCS eye subscore is 4. GCS verbal subscore is 5. GCS motor subscore is 6.  Skin: Skin is warm and dry.  Psychiatric: His mood appears anxious. He is agitated.  Restless, intermittently sleeping face down on stretcher  Vitals reviewed.   ED Course  Procedures (including critical care time) Labs Review Labs Reviewed  CBC WITH DIFFERENTIAL/PLATELET - Abnormal; Notable for the following:    Neutro Abs 8.0 (*)    All other components within normal limits  COMPREHENSIVE METABOLIC PANEL - Abnormal; Notable for the following:    Potassium 3.2 (*)    Glucose, Bld 285 (*)    Creatinine, Ser 1.42 (*)    Calcium 8.4 (*)    Total Protein 6.4 (*)    Albumin 2.4 (*)    ALT 16 (*)    GFR calc non Af Amer 58 (*)    All other components within normal limits  LIPASE, BLOOD - Abnormal; Notable for the following:    Lipase 69 (*)    All other components within normal limits  URINALYSIS, ROUTINE W REFLEX MICROSCOPIC (NOT AT [facility]) - Abnormal; Notable for the following:    Glucose, UA 250 (*)    Hgb urine dipstick MODERATE (*)    Protein, ur >300 (*)    All other components within normal limits  URINE MICROSCOPIC-ADD ON - Abnormal; Notable for the following:    Squamous Epithelial / LPF 6-30 (*)    Bacteria, UA RARE (*)    All other components within normal limits  URINE RAPID DRUG SCREEN, HOSP PERFORMED  I-STAT CG4 LACTIC ACID, ED  Rosezena Sensor, ED    Imaging Review Dg Abd Acute W/chest  07/26/2015  CLINICAL DATA:   45 year old male with acute onset nausea vomiting and epigastric pain EXAM: DG ABDOMEN ACUTE W/ 1V CHEST COMPARISON:  Prior chest x-ray 06/27/2015 FINDINGS: Stable borderline cardiomegaly. Chronic disc like atelectasis versus scarring in the right lung base is similar compared to prior imaging. No pulmonary edema, focal airspace consolidation, pleural effusion or pneumothorax. The bowel gas pattern is not obstructed. No large free air. Prior repair of right acetabular fracture. No evidence of hardware complication. No organomegaly or suspicious calcifications. IMPRESSION: 1. Stable chest x-ray without evidence of acute cardiopulmonary process. 2. Unremarkable common non obstructed bowel gas pattern. Electronically Signed  By: Malachy Moan M.D.   On: 07/26/2015 19:27   I have personally reviewed and evaluated these images and lab results as part of my medical decision-making.   EKG Interpretation   Date/Time:  Saturday July 27 2015 08:23:58 EST Ventricular Rate:  89 PR Interval:  211 QRS Duration: 95 QT Interval:  412 QTC Calculation: 501 R Axis:   -108 Text Interpretation:  Sinus rhythm Prolonged PR interval Abnormal R-wave  progression, late transition Inferior infarct, old Prolonged QT interval  Baseline wander in lead(s) V1 No significant change since last tracing  Confirmed by Daijon Wenke MD, Shabree Tebbetts ([id number]) on 07/27/2015 9:27:09 AM      MDM   Final diagnoses:  Diabetic gastroparesis (HCC)    45 y.o. male presents with 3rd visit in 24 hours for persistent nausea vomiting and ongoing pain. No signs of booerhave syndrome, AFVSS again and repeat labs obtained without significant change in metabolic status between visits. Slight elevation of lipase is nondiagnostic for pancreatitis and do not suspect currently. Slept intermittently during his ED course. Pt intermittently sitting at end of bed retching but not vomiting. I spoke with Pt and wife regarding possible benefit of haldol and IVF  for breaking cyclic vomiting cycle since he has failed zofran on last 2 visits and they agreed to try. While I was in with another critical patient nursing informed me that Pt refused IV, requested pain medication, and then eloped from the ED. I was unable to discuss the implications of leaving without treatment but Pt was capable of making decisions, stable and ambulatory.      Lyndal Pulley, MD 07/27/15 [phone]

## 2015-07-27 NOTE — ED Notes (Signed)
Calling out for pain meds. No new orders.

## 2015-07-27 NOTE — ED Notes (Signed)
Pt just d/c with nausea, pt never left and checked back in with c/o CP and dizziness. Pt states pain just started under his left breast and it "feels like bubbles". Pt continues to request pain meds.

## 2015-07-28 ENCOUNTER — Emergency Department (HOSPITAL_COMMUNITY)
Admission: EM | Admit: 2015-07-28 | Discharge: 2015-07-29 | Disposition: A | Discharge status: Home / Self Care | Payer: Medicare Other | Attending: Emergency Medicine | Admitting: Emergency Medicine

## 2015-07-28 ENCOUNTER — Encounter (HOSPITAL_COMMUNITY): Payer: Self-pay | Admitting: Emergency Medicine

## 2015-07-28 DIAGNOSIS — Z87448 Personal history of other diseases of urinary system: Secondary | ICD-10-CM | POA: Diagnosis not present

## 2015-07-28 DIAGNOSIS — Z862 Personal history of diseases of the blood and blood-forming organs and certain disorders involving the immune mechanism: Secondary | ICD-10-CM | POA: Insufficient documentation

## 2015-07-28 DIAGNOSIS — Z79899 Other long term (current) drug therapy: Secondary | ICD-10-CM | POA: Insufficient documentation

## 2015-07-28 DIAGNOSIS — K3184 Gastroparesis: Secondary | ICD-10-CM

## 2015-07-28 DIAGNOSIS — G4733 Obstructive sleep apnea (adult) (pediatric): Secondary | ICD-10-CM | POA: Insufficient documentation

## 2015-07-28 DIAGNOSIS — J45909 Unspecified asthma, uncomplicated: Secondary | ICD-10-CM | POA: Insufficient documentation

## 2015-07-28 DIAGNOSIS — Z794 Long term (current) use of insulin: Secondary | ICD-10-CM | POA: Insufficient documentation

## 2015-07-28 DIAGNOSIS — E119 Type 2 diabetes mellitus without complications: Secondary | ICD-10-CM | POA: Insufficient documentation

## 2015-07-28 DIAGNOSIS — R1084 Generalized abdominal pain: Secondary | ICD-10-CM | POA: Diagnosis present

## 2015-07-28 HISTORY — DX: Disorder of kidney and ureter, unspecified: N28.9

## 2015-07-28 HISTORY — DX: Gastroparesis: K31.84

## 2015-07-28 NOTE — ED Notes (Signed)
Bed: WTR6 Expected date:  Expected time:  Means of arrival:  Comments: EMS 44yo abd pain / n/v - triage

## 2015-07-28 NOTE — ED Notes (Addendum)
Pt states that he has a hx of gastroparesis but has been seen the past 2 days at [facility]. Has been given zofran with no relief. Generalized abdominal pain. Wears oxygen because "his levels drop when he sleeps" but he wears O2 during day as well. No active vomiting with EMS. Dry heaving. Alert and oriented.

## 2015-07-29 DIAGNOSIS — K3184 Gastroparesis: Secondary | ICD-10-CM | POA: Diagnosis not present

## 2015-07-29 LAB — CBC
HEMATOCRIT: 44.9 % (ref 39.0–52.0)
Hemoglobin: 14.3 g/dL (ref 13.0–17.0)
MCH: 26.6 pg (ref 26.0–34.0)
MCHC: 31.8 g/dL (ref 30.0–36.0)
MCV: 83.5 fL (ref 78.0–100.0)
Platelets: 275 10*3/uL (ref 150–400)
RBC: 5.38 MIL/uL (ref 4.22–5.81)
RDW: 14.5 % (ref 11.5–15.5)
WBC: 10.7 10*3/uL — AB (ref 4.0–10.5)

## 2015-07-29 LAB — COMPREHENSIVE METABOLIC PANEL
ALBUMIN: 2.9 g/dL — AB (ref 3.5–5.0)
ALT: 13 U/L — ABNORMAL LOW (ref 17–63)
AST: 17 U/L (ref 15–41)
Alkaline Phosphatase: 97 U/L (ref 38–126)
Anion gap: 12 (ref 5–15)
BUN: 14 mg/dL (ref 6–20)
CHLORIDE: 109 mmol/L (ref 101–111)
CO2: 27 mmol/L (ref 22–32)
Calcium: 9.7 mg/dL (ref 8.9–10.3)
Creatinine, Ser: 1.75 mg/dL — ABNORMAL HIGH (ref 0.61–1.24)
GFR calc Af Amer: 52 mL/min — ABNORMAL LOW (ref >60)
GFR, EST NON AFRICAN AMERICAN: 45 mL/min — AB (ref >60)
GLUCOSE: 188 mg/dL — AB (ref 65–99)
POTASSIUM: 3.3 mmol/L — AB (ref 3.5–5.1)
SODIUM: 148 mmol/L — AB (ref 135–145)
Total Bilirubin: 0.7 mg/dL (ref 0.3–1.2)
Total Protein: 7 g/dL (ref 6.5–8.1)

## 2015-07-29 LAB — LIPASE, BLOOD: LIPASE: 28 U/L (ref 11–51)

## 2015-07-29 LAB — URINALYSIS, ROUTINE W REFLEX MICROSCOPIC
Bilirubin Urine: NEGATIVE
GLUCOSE, UA: 100 mg/dL — AB
KETONES UR: NEGATIVE mg/dL
LEUKOCYTES UA: NEGATIVE
Nitrite: NEGATIVE
PH: 7 (ref 5.0–8.0)
Protein, ur: >300 mg/dL — AB
Specific Gravity, Urine: 1.013 (ref 1.005–1.030)

## 2015-07-29 LAB — URINE MICROSCOPIC-ADD ON: Bacteria, UA: NONE SEEN

## 2015-07-29 MED ORDER — TRAMADOL HCL 50 MG PO TABS: 50.0000 mg | ORAL_TABLET | Freq: Four times a day (QID) | ORAL | Status: DC | PRN

## 2015-07-29 MED ORDER — HYDROMORPHONE HCL 1 MG/ML IJ SOLN
1.0000 mg | Freq: Once | INTRAMUSCULAR | Status: AC
  Administered 2015-07-29: 1 mg via INTRAVENOUS
  Filled 2015-07-29: qty 1

## 2015-07-29 MED ORDER — METOCLOPRAMIDE HCL 5 MG/ML IJ SOLN
10.0000 mg | Freq: Once | INTRAMUSCULAR | Status: AC
  Administered 2015-07-29: 10 mg via INTRAVENOUS
  Filled 2015-07-29: qty 2

## 2015-07-29 MED ORDER — HALOPERIDOL LACTATE 5 MG/ML IJ SOLN
2.0000 mg | Freq: Once | INTRAMUSCULAR | Status: AC
  Administered 2015-07-29: 2 mg via INTRAVENOUS
  Filled 2015-07-29: qty 1

## 2015-07-29 MED ORDER — METOCLOPRAMIDE HCL 10 MG PO TABS: 10.0000 mg | ORAL_TABLET | Freq: Four times a day (QID) | ORAL | Status: DC | PRN

## 2015-07-29 MED ORDER — ONDANSETRON HCL 4 MG/2ML IJ SOLN
4.0000 mg | Freq: Once | INTRAMUSCULAR | Status: AC | PRN
  Administered 2015-07-29: 4 mg via INTRAVENOUS
  Filled 2015-07-29: qty 2

## 2015-07-29 MED ORDER — SODIUM CHLORIDE 0.9 % IV BOLUS (SEPSIS)
1000.0000 mL | Freq: Once | INTRAVENOUS | Status: AC
  Administered 2015-07-29: 1000 mL via INTRAVENOUS

## 2015-07-29 MED ORDER — TRAMADOL HCL 50 MG PO TABS
100.0000 mg | ORAL_TABLET | Freq: Once | ORAL | Status: AC
  Administered 2015-07-29: 100 mg via ORAL
  Filled 2015-07-29: qty 2

## 2015-07-29 NOTE — ED Notes (Signed)
Offered pt 240 ml of water for PO challenge. Plan to reassess.

## 2015-07-29 NOTE — ED Provider Notes (Signed)
CSN: [id number]     Arrival date & time 07/28/15  2224 History  By signing my name below, I, Kenneth Fox, attest that this documentation has been prepared under the direction and in the presence of Kenneth Kaplan, MD. Electronically Signed: Tanda Fox, ED Scribe. 07/29/2015. 2:32 AM.   Chief Complaint  Patient presents with  . Abdominal Pain   The history is provided by the patient. No language interpreter was used.     HPI Comments: Kenneth Fox is a 45 y.o. male brought in by ambulance, with PMHx DM and gastroparesis who presents to the Emergency Department complaining of gradual onset, constant, worsening, diffuse abdominal pain x 3 days. He estimates 3 episodes of vomiting in the past 24 hours. Pt reports that he feels nauseous throughout the entire day. He is able to keep fluids down but notes worsening abdominal pain with fluid intake. Pt usually takes Zofran for his gastroparesis but reports it has not been helping recently. He has been seen at [facility] ED twice in the past 2 days for similar symptoms.  Denies EtOH or illicit drug use. No previous abdominal surgeries. Denies diarrhea, constipation, obstipation, any other associated symptoms.    Past Medical History  Diagnosis Date  . Sickle cell trait (HCC)   . Diabetes (HCC)   . Obstructive sleep apnea     will not wear CPAP  . Asthma   . GERD (gastroesophageal reflux disease)   . Gastroparesis   . Kidney disorder   . Neuropathy [facility])    Past Surgical History  Procedure Laterality Date  . Hip fracture surgery Right 1999    "put a plate in"   Family History  Problem Relation Age of Onset  . Diabetes Father   . Neuropathy Father   . Hypertension Father   . Sickle cell anemia Mother   . Kidney disease Brother   . Kidney disease Sister   . Aneurysm Father   . Heart attack Mother    Social History  Substance Use Topics  . Smoking status: Never Smoker   . Smokeless tobacco: Never Used  . Alcohol Use: No     Review of Systems  A complete 10 system review of systems was obtained and all systems are negative except as noted in the HPI and PMH.   Allergies  Angiotensin receptor blockers and Lisinopril  Home Medications   Prior to Admission medications   Medication Sig Start Date End Date Taking? Authorizing Provider  amLODipine (NORVASC) 5 MG tablet Take 1 tablet (5 mg total) by mouth daily. 06/06/15  Yes Alison Murray, MD  dicyclomine (BENTYL) 20 MG tablet Take 1 tablet (20 mg total) by mouth 2 (two) times daily. 05/29/15  Yes Benjamin Cartner, PA-C  insulin aspart (NOVOLOG) 100 UNIT/ML injection Inject 10-20 Units into the skin 3 (three) times daily with meals. per sliding scale CBG 70 - 120: 0 units CBG 121 - 150: 2 units CBG 151 - 200: 3 units CBG 201 - 250: 5 units CBG 251 - 300: 8 units CBG 301 - 350: 11 units CBG 351 - 400: 15 units 06/06/15  Yes Alison Murray, MD  insulin detemir (LEVEMIR) 100 UNIT/ML injection Inject 0.4 mLs (40 Units total) into the skin daily. Patient taking differently: Inject 80 Units into the skin daily.  06/06/15  Yes Alison Murray, MD  LYRICA 100 MG capsule Take 100 mg by mouth 3 (three) times daily. 05/02/15  Yes Historical Provider, MD  ondansetron (  ZOFRAN ODT) 8 MG disintegrating tablet Take 1 tablet (8 mg total) by mouth every 8 (eight) hours as needed for nausea or vomiting. 07/26/15  Yes Linwood Dibbles, MD  Oxycodone HCl 10 MG TABS Take 1 tablet by mouth every 6 (six) hours as needed (pain).  04/17/14  Yes Historical Provider, MD  hydrALAZINE (APRESOLINE) 50 MG tablet Take 1 tablet (50 mg total) by mouth 3 (three) times daily. Patient not taking: Reported on 06/27/2015 06/06/15   Alison Murray, MD  insulin aspart (NOVOLOG) 100 UNIT/ML injection Inject 8 Units into the skin 3 (three) times daily with meals. Patient not taking: Reported on 07/29/2015 06/06/15   Alison Murray, MD  metoCLOPramide (REGLAN) 10 MG tablet Take 1-2 tablets (10-20 mg total) by mouth  every 6 (six) hours as needed for nausea or vomiting. 07/29/15   Kenneth Kaplan, MD  ondansetron (ZOFRAN) 4 MG tablet Take 1 tablet (4 mg total) by mouth every 6 (six) hours as needed for nausea. Patient not taking: Reported on 07/29/2015 06/06/15   Alison Murray, MD  traMADol (ULTRAM) 50 MG tablet Take 1 tablet (50 mg total) by mouth every 6 (six) hours as needed. 07/29/15   Naudia Crosley, MD   BP 154/97 mmHg  Pulse 93  Temp(Src) 98.2 F (36.8 C) (Oral)  Resp 18  SpO2 93%   Physical Exam  Constitutional: He is oriented to person, place, and time. He appears well-developed and well-nourished. No distress.  HENT:  Head: Normocephalic and atraumatic.  Eyes: Conjunctivae and EOM are normal.  Neck: Neck supple. No tracheal deviation present.  Cardiovascular: Normal rate, regular rhythm and normal heart sounds.   Pulmonary/Chest: Effort normal. No respiratory distress.  Abdominal: Soft. Bowel sounds are normal. There is no tenderness.  No significant tenderness to palpation  Musculoskeletal: Normal range of motion.  Neurological: He is alert and oriented to person, place, and time.  Skin: Skin is warm and dry.  Psychiatric: He has a normal mood and affect. His behavior is normal.  Nursing note and vitals reviewed.   ED Course  Procedures (including critical care time)  DIAGNOSTIC STUDIES: Oxygen Saturation is 99% on RA, normal by my interpretation.    COORDINATION OF CARE: 2:24 AM-Discussed treatment plan with pt at bedside and pt agreed to plan.   3:39 AM - Upon reassessment, pt states he is feeling better. He passed PO challenge. Will prescribed pt Reglan for his nausea upon discharge. Discussed discharge and returns precautions with pt. Will also give him referral to GI.   Labs Review Labs Reviewed  COMPREHENSIVE METABOLIC PANEL - Abnormal; Notable for the following:    Sodium 148 (*)    Potassium 3.3 (*)    Glucose, Bld 188 (*)    Creatinine, Ser 1.75 (*)    Albumin 2.9  (*)    ALT 13 (*)    GFR calc non Af Amer 45 (*)    GFR calc Af Amer 52 (*)    All other components within normal limits  CBC - Abnormal; Notable for the following:    WBC 10.7 (*)    All other components within normal limits  URINALYSIS, ROUTINE W REFLEX MICROSCOPIC (NOT AT [facility]) - Abnormal; Notable for the following:    Glucose, UA 100 (*)    Hgb urine dipstick SMALL (*)    Protein, ur >300 (*)    All other components within normal limits  URINE MICROSCOPIC-ADD ON - Abnormal; Notable for the following:  Squamous Epithelial / LPF 0-5 (*)    Casts HYALINE CASTS (*)    All other components within normal limits  LIPASE, BLOOD    Imaging Review No results found. I have personally reviewed and evaluated these lab results as part of my medical decision-making.   EKG Interpretation None      MDM   Final diagnoses:  Gastroparesis   I personally performed the services described in this documentation, which was scribed in my presence. The recorded information has been reviewed and is accurate.  PT with gastroparesis flair. 3rd visit to the ER in the past few days. Will start oral challenge. If pt gets better - we will d/c.  Will add reglan to his regimen. GI f/u will be provided if discharged.   Kenneth Kaplan, MD 07/30/15 2128

## 2015-07-29 NOTE — Discharge Instructions (Signed)
Gastroparesis °Gastroparesis, also called delayed gastric emptying, is a condition in which food takes longer than normal to empty from the stomach. The condition is usually long-lasting (chronic). °CAUSES °This condition may be caused by: °· An endocrine disorder, such as hypothyroidism or diabetes. Diabetes is the most common cause of this condition. °· A nervous system disease, such as Parkinson disease or multiple sclerosis. °· Cancer, infection, or surgery of the stomach or vagus nerve. °· A connective tissue disorder, such as scleroderma. °· Certain medicines. °In most cases, the cause is not known. °RISK FACTORS °This condition is more likely to develop in: °· People with certain disorders, including endocrine disorders, eating disorders, amyloidosis, and scleroderma. °· People with certain diseases, including Parkinson disease or multiple sclerosis. °· People with cancer or infection of the stomach or vagus nerve. °· People who have had surgery on the stomach or vagus nerve. °· People who take certain medicines. °· Women. °SYMPTOMS °Symptoms of this condition include: °· An early feeling of fullness when eating. °· Nausea. °· Weight loss. °· Vomiting. °· Heartburn. °· Abdominal bloating. °· Inconsistent blood glucose levels. °· Lack of appetite. °· Acid from the stomach coming up into the esophagus (gastroesophageal reflux). °· Spasms of the stomach. °Symptoms may come and go. °DIAGNOSIS °This condition is diagnosed with tests, such as: °· Tests that check how long it takes food to move through the stomach and intestines. These tests include: °¨ Upper gastrointestinal (GI) series. In this test, X-rays of the intestines are taken after you drink a liquid. The liquid makes the intestines show up better on the X-rays. °¨ Gastric emptying scintigraphy. In this test, scans are taken after you eat food that contains a small amount of radioactive material. °¨ Wireless capsule GI monitoring system. This test  involves swallowing a capsule that records information about movement through the stomach. °· Gastric manometry. This test measures electrical and muscular activity in the stomach. It is done with a thin tube that is passed down the throat and into the stomach. °· Endoscopy. This test checks for abnormalities in the lining of the stomach. It is done with a long, thin tube that is passed down the throat and into the stomach. °· An ultrasound. This test can help rule out gallbladder disease or pancreatitis as a cause of your symptoms. It uses sound waves to take pictures of the inside of your body. °TREATMENT °There is no cure for gastroparesis. This condition may be managed with: °· Treatment of the underlying condition causing the gastroparesis. °· Lifestyle changes, including exercise and dietary changes. Dietary changes can include: °¨ Changes in what and when you eat. °¨ Eating smaller meals more often. °¨ Eating low-fat foods. °¨ Eating low-fiber forms of high-fiber foods, such as cooked vegetables instead of raw vegetables. °¨ Having liquid foods in place of solid foods. Liquid foods are easier to digest. °· Medicines. These may be given to control nausea and vomiting and to stimulate stomach muscles. °· Getting food through a feeding tube. This may be done in severe cases. °· A gastric neurostimulator. This is a device that is inserted into the body with surgery. It helps improve stomach emptying and control nausea and vomiting. °HOME CARE INSTRUCTIONS °· Follow your health care provider's instructions about exercise and diet. °· Take medicines only as directed by your health care provider. °SEEK MEDICAL CARE IF: °· Your symptoms do not improve with treatment. °· You have new symptoms. °SEEK IMMEDIATE MEDICAL CARE IF: °· You have   severe abdominal pain that does not improve with treatment. °· You have nausea that does not go away. °· You cannot keep fluids down. °  °This information is not intended to replace  advice given to you by your health care provider. Make sure you discuss any questions you have with your health care provider. °  °Document Released: 05/25/2005 Document Revised: 10/09/2014 Document Reviewed: 05/21/2014 °Elsevier Interactive Patient Education ©2016 [employer] ° °

## 2015-10-25 ENCOUNTER — Emergency Department (HOSPITAL_COMMUNITY)
Admission: EM | Admit: 2015-10-25 | Discharge: 2015-10-25 | Disposition: A | Discharge status: Home / Self Care | Payer: Medicare Other | Attending: Emergency Medicine | Admitting: Emergency Medicine

## 2015-10-25 ENCOUNTER — Encounter (HOSPITAL_COMMUNITY): Payer: Self-pay | Admitting: Emergency Medicine

## 2015-10-25 DIAGNOSIS — Z7984 Long term (current) use of oral hypoglycemic drugs: Secondary | ICD-10-CM | POA: Insufficient documentation

## 2015-10-25 DIAGNOSIS — Z8669 Personal history of other diseases of the nervous system and sense organs: Secondary | ICD-10-CM | POA: Diagnosis not present

## 2015-10-25 DIAGNOSIS — Z794 Long term (current) use of insulin: Secondary | ICD-10-CM | POA: Diagnosis not present

## 2015-10-25 DIAGNOSIS — J45909 Unspecified asthma, uncomplicated: Secondary | ICD-10-CM | POA: Diagnosis not present

## 2015-10-25 DIAGNOSIS — R1084 Generalized abdominal pain: Secondary | ICD-10-CM | POA: Diagnosis not present

## 2015-10-25 DIAGNOSIS — R110 Nausea: Secondary | ICD-10-CM | POA: Insufficient documentation

## 2015-10-25 DIAGNOSIS — Z79899 Other long term (current) drug therapy: Secondary | ICD-10-CM | POA: Insufficient documentation

## 2015-10-25 DIAGNOSIS — Z862 Personal history of diseases of the blood and blood-forming organs and certain disorders involving the immune mechanism: Secondary | ICD-10-CM | POA: Insufficient documentation

## 2015-10-25 DIAGNOSIS — E119 Type 2 diabetes mellitus without complications: Secondary | ICD-10-CM | POA: Diagnosis not present

## 2015-10-25 DIAGNOSIS — E669 Obesity, unspecified: Secondary | ICD-10-CM | POA: Diagnosis not present

## 2015-10-25 DIAGNOSIS — Z87448 Personal history of other diseases of urinary system: Secondary | ICD-10-CM | POA: Diagnosis not present

## 2015-10-25 LAB — URINALYSIS, ROUTINE W REFLEX MICROSCOPIC
Bilirubin Urine: NEGATIVE
GLUCOSE, UA: 250 mg/dL — AB
KETONES UR: NEGATIVE mg/dL
LEUKOCYTES UA: NEGATIVE
Nitrite: NEGATIVE
Specific Gravity, Urine: 1.021 (ref 1.005–1.030)
pH: 6 (ref 5.0–8.0)

## 2015-10-25 LAB — LIPASE, BLOOD: LIPASE: 76 U/L — AB (ref 11–51)

## 2015-10-25 LAB — COMPREHENSIVE METABOLIC PANEL
ALK PHOS: 99 U/L (ref 38–126)
ALT: 16 U/L — AB (ref 17–63)
AST: 16 U/L (ref 15–41)
Albumin: 2.4 g/dL — ABNORMAL LOW (ref 3.5–5.0)
Anion gap: 8 (ref 5–15)
BUN: 6 mg/dL (ref 6–20)
CALCIUM: 8.5 mg/dL — AB (ref 8.9–10.3)
CHLORIDE: 101 mmol/L (ref 101–111)
CO2: 29 mmol/L (ref 22–32)
CREATININE: 1.55 mg/dL — AB (ref 0.61–1.24)
GFR calc Af Amer: >60 mL/min (ref >60)
GFR, EST NON AFRICAN AMERICAN: 52 mL/min — AB (ref >60)
Glucose, Bld: 215 mg/dL — ABNORMAL HIGH (ref 65–99)
Potassium: 3.2 mmol/L — ABNORMAL LOW (ref 3.5–5.1)
Sodium: 138 mmol/L (ref 135–145)
Total Bilirubin: 0.4 mg/dL (ref 0.3–1.2)
Total Protein: 6.3 g/dL — ABNORMAL LOW (ref 6.5–8.1)

## 2015-10-25 LAB — URINE MICROSCOPIC-ADD ON

## 2015-10-25 LAB — CBC
HCT: 39.7 % (ref 39.0–52.0)
Hemoglobin: 12.7 g/dL — ABNORMAL LOW (ref 13.0–17.0)
MCH: 26.5 pg (ref 26.0–34.0)
MCHC: 32 g/dL (ref 30.0–36.0)
MCV: 82.9 fL (ref 78.0–100.0)
PLATELETS: 247 10*3/uL (ref 150–400)
RBC: 4.79 MIL/uL (ref 4.22–5.81)
RDW: 15.5 % (ref 11.5–15.5)
WBC: 9 10*3/uL (ref 4.0–10.5)

## 2015-10-25 MED ORDER — ONDANSETRON 4 MG PO TBDP
4.0000 mg | ORAL_TABLET | Freq: Once | ORAL | Status: AC
  Administered 2015-10-25: 4 mg via ORAL
  Filled 2015-10-25: qty 1

## 2015-10-25 MED ORDER — ONDANSETRON 4 MG PO TBDP: 4.0000 mg | ORAL_TABLET | Freq: Three times a day (TID) | ORAL | Status: DC | PRN

## 2015-10-25 NOTE — ED Provider Notes (Signed)
CSN: [id number]     Arrival date & time 10/25/15  2028 History   First MD Initiated Contact with Patient 10/25/15 2213     Chief Complaint  Patient presents with  . Abdominal Pain     (Consider location/radiation/quality/duration/timing/severity/associated sxs/prior Treatment) HPI Comments: Patient presents today with a chief complaint of generalized abdominal pain.  He describes the pain as a fullness.  He states that he took Oxycodone for the pain, which resolved the pain.  He denies any abdominal pain at this time.  He states that he has a history of Gastroparesis and that the pain that he had today felt similar.  He states that his last BM was 4-5 hours ago and was normal.  He reports associated dry heaving, but denies actual vomiting.  Denies fever or chills.  Denies urinary symptoms.  Denies chest pain.  Patient is a 45 y.o. male presenting with abdominal pain. The history is provided by the patient.  Abdominal Pain   Past Medical History  Diagnosis Date  . Sickle cell trait (HCC)   . Diabetes (HCC)   . Obstructive sleep apnea     will not wear CPAP  . Asthma   . GERD (gastroesophageal reflux disease)   . Gastroparesis   . Kidney disorder   . Neuropathy (HCC)   . Obesity    Past Surgical History  Procedure Laterality Date  . Hip fracture surgery Right 1999    "put a plate in"   Family History  Problem Relation Age of Onset  . Diabetes Father   . Neuropathy Father   . Hypertension Father   . Sickle cell anemia Mother   . Kidney disease Brother   . Kidney disease Sister   . Aneurysm Father   . Heart attack Mother    Social History  Substance Use Topics  . Smoking status: Never Smoker   . Smokeless tobacco: Never Used  . Alcohol Use: No    Review of Systems  Gastrointestinal: Positive for abdominal pain.  All other systems reviewed and are negative.     Allergies  Angiotensin receptor blockers and Lisinopril  Home Medications   Prior to Admission  medications   Medication Sig Start Date End Date Taking? Authorizing Provider  amLODipine (NORVASC) 5 MG tablet Take 1 tablet (5 mg total) by mouth daily. 06/06/15  Yes Alison Murray, MD  furosemide (LASIX) 40 MG tablet Take 40 mg by mouth 2 (two) times daily. 10/15/15  Yes Historical Provider, MD  hydrALAZINE (APRESOLINE) 50 MG tablet Take 1 tablet (50 mg total) by mouth 3 (three) times daily. 06/06/15  Yes Alison Murray, MD  insulin detemir (LEVEMIR) 100 UNIT/ML injection Inject 0.4 mLs (40 Units total) into the skin daily. Patient taking differently: Inject 80 Units into the skin every evening.  06/06/15  Yes Alison Murray, MD  LYRICA 100 MG capsule Take 100 mg by mouth 3 (three) times daily. 05/02/15  Yes Historical Provider, MD  metFORMIN (GLUCOPHAGE) 500 MG tablet Take 500 mg by mouth 3 (three) times daily. 09/22/15  Yes Historical Provider, MD  metoprolol succinate (TOPROL-XL) 50 MG 24 hr tablet Take 50 mg by mouth daily. 10/18/15  Yes Historical Provider, MD  Oxycodone HCl 10 MG TABS Take 1 tablet by mouth every 6 (six) hours as needed (pain).  04/17/14  Yes Historical Provider, MD  potassium chloride SA (K-DUR,KLOR-CON) 20 MEQ tablet Take 20 mEq by mouth daily. 10/07/15  Yes Historical Provider, MD  dicyclomine (BENTYL)  20 MG tablet Take 1 tablet (20 mg total) by mouth 2 (two) times daily. 05/29/15   Joycie Peek, PA-C  insulin aspart (NOVOLOG) 100 UNIT/ML injection Inject 10-20 Units into the skin 3 (three) times daily with meals. per sliding scale CBG 70 - 120: 0 units CBG 121 - 150: 2 units CBG 151 - 200: 3 units CBG 201 - 250: 5 units CBG 251 - 300: 8 units CBG 301 - 350: 11 units CBG 351 - 400: 15 units 06/06/15   Alison Murray, MD  insulin aspart (NOVOLOG) 100 UNIT/ML injection Inject 8 Units into the skin 3 (three) times daily with meals. Patient not taking: Reported on 07/29/2015 06/06/15   Alison Murray, MD  metoCLOPramide (REGLAN) 10 MG tablet Take 1-2 tablets (10-20 mg total)  by mouth every 6 (six) hours as needed for nausea or vomiting. 07/29/15   Derwood Kaplan, MD  ondansetron (ZOFRAN ODT) 8 MG disintegrating tablet Take 1 tablet (8 mg total) by mouth every 8 (eight) hours as needed for nausea or vomiting. 07/26/15   Linwood Dibbles, MD  ondansetron (ZOFRAN) 4 MG tablet Take 1 tablet (4 mg total) by mouth every 6 (six) hours as needed for nausea. Patient not taking: Reported on 07/29/2015 06/06/15   Alison Murray, MD  traMADol (ULTRAM) 50 MG tablet Take 1 tablet (50 mg total) by mouth every 6 (six) hours as needed. 07/29/15   Ankit Nanavati, MD   BP 131/72 mmHg  Pulse 72  Temp(Src) 98.8 F (37.1 C) (Oral)  SpO2 90% Physical Exam  Constitutional: He appears well-developed and well-nourished.  HENT:  Head: Normocephalic and atraumatic.  Mouth/Throat: Oropharynx is clear and moist.  Neck: Normal range of motion. Neck supple.  Cardiovascular: Normal rate, regular rhythm and normal heart sounds.   Pulmonary/Chest: Effort normal and breath sounds normal.  Abdominal: Soft. Bowel sounds are normal. He exhibits no mass. There is no tenderness. There is no rebound and no guarding.  Obese abdomen  Musculoskeletal: Normal range of motion.  Neurological: He is alert.  Skin: Skin is warm and dry.  Psychiatric: He has a normal mood and affect.  Nursing note and vitals reviewed.   ED Course  Procedures (including critical care time) Labs Review Labs Reviewed  LIPASE, BLOOD - Abnormal; Notable for the following:    Lipase 76 (*)    All other components within normal limits  COMPREHENSIVE METABOLIC PANEL - Abnormal; Notable for the following:    Potassium 3.2 (*)    Glucose, Bld 215 (*)    Creatinine, Ser 1.55 (*)    Calcium 8.5 (*)    Total Protein 6.3 (*)    Albumin 2.4 (*)    ALT 16 (*)    GFR calc non Af Amer 52 (*)    All other components within normal limits  CBC - Abnormal; Notable for the following:    Hemoglobin 12.7 (*)    All other components within  normal limits  URINALYSIS, ROUTINE W REFLEX MICROSCOPIC (NOT AT [facility]) - Abnormal; Notable for the following:    Glucose, UA 250 (*)    Hgb urine dipstick TRACE (*)    Protein, ur >300 (*)    All other components within normal limits  URINE MICROSCOPIC-ADD ON - Abnormal; Notable for the following:    Squamous Epithelial / LPF 0-5 (*)    Bacteria, UA RARE (*)    All other components within normal limits    Imaging Review No results found. I  have personally reviewed and evaluated these images and lab results as part of my medical decision-making.   EKG Interpretation None      MDM   Final diagnoses:  None   Patient presents today with a chief complaint of generalized abdominal pain.  However, by the time of my evaluation he denies pain.  Abdomen is soft and non tender on exam.  Labs unremarkable.  He is requesting to be discharged.  Feel that the patient is stable for discharge.  Return precautions given.    Santiago GladHeather Dawnell Bryant, PA-C 10/27/15 2224  Donnetta HutchingBrian Cook, MD 10/27/15 [phone]

## 2015-10-25 NOTE — ED Notes (Signed)
Pt. reports generalized abdominal pain with emesis and diarrhea onset 4 days ago , denies fever , respirations unlabored .

## 2015-10-25 NOTE — ED Notes (Signed)
Pt states he is bee waiting too long and he will like to live, pt oriented that MD will see him ASAP, and that for him to leave now it will be against medical advice, pt encouraged to wait to be assess by MD.

## 2015-10-25 NOTE — ED Notes (Signed)
Pts o2 sats dropping down into the low 80s on room air.  Pt on o2 at home PRN.  Pt placed on 2lpm [place].  Sats came up to 90%.  Pt now on 4lpm [place] with sats improving to 94% on same.

## 2015-10-30 ENCOUNTER — Emergency Department (HOSPITAL_COMMUNITY): Payer: Medicare Other

## 2015-10-30 ENCOUNTER — Encounter (HOSPITAL_COMMUNITY): Payer: Self-pay | Admitting: Emergency Medicine

## 2015-10-30 ENCOUNTER — Emergency Department (HOSPITAL_COMMUNITY)
Admission: EM | Admit: 2015-10-30 | Discharge: 2015-10-31 | Disposition: A | Discharge status: Home / Self Care | Payer: Medicare Other | Attending: Emergency Medicine | Admitting: Emergency Medicine

## 2015-10-30 DIAGNOSIS — M25571 Pain in right ankle and joints of right foot: Secondary | ICD-10-CM | POA: Insufficient documentation

## 2015-10-30 DIAGNOSIS — R197 Diarrhea, unspecified: Secondary | ICD-10-CM | POA: Insufficient documentation

## 2015-10-30 DIAGNOSIS — Z8719 Personal history of other diseases of the digestive system: Secondary | ICD-10-CM | POA: Insufficient documentation

## 2015-10-30 DIAGNOSIS — Z862 Personal history of diseases of the blood and blood-forming organs and certain disorders involving the immune mechanism: Secondary | ICD-10-CM | POA: Insufficient documentation

## 2015-10-30 DIAGNOSIS — E119 Type 2 diabetes mellitus without complications: Secondary | ICD-10-CM | POA: Insufficient documentation

## 2015-10-30 DIAGNOSIS — Z79899 Other long term (current) drug therapy: Secondary | ICD-10-CM | POA: Diagnosis not present

## 2015-10-30 DIAGNOSIS — R143 Flatulence: Secondary | ICD-10-CM | POA: Insufficient documentation

## 2015-10-30 DIAGNOSIS — Z87448 Personal history of other diseases of urinary system: Secondary | ICD-10-CM | POA: Diagnosis not present

## 2015-10-30 DIAGNOSIS — J45909 Unspecified asthma, uncomplicated: Secondary | ICD-10-CM | POA: Insufficient documentation

## 2015-10-30 DIAGNOSIS — R1013 Epigastric pain: Secondary | ICD-10-CM | POA: Diagnosis present

## 2015-10-30 DIAGNOSIS — R112 Nausea with vomiting, unspecified: Secondary | ICD-10-CM | POA: Insufficient documentation

## 2015-10-30 DIAGNOSIS — Z794 Long term (current) use of insulin: Secondary | ICD-10-CM | POA: Insufficient documentation

## 2015-10-30 DIAGNOSIS — R079 Chest pain, unspecified: Secondary | ICD-10-CM | POA: Diagnosis not present

## 2015-10-30 DIAGNOSIS — Z7984 Long term (current) use of oral hypoglycemic drugs: Secondary | ICD-10-CM | POA: Insufficient documentation

## 2015-10-30 DIAGNOSIS — R630 Anorexia: Secondary | ICD-10-CM | POA: Insufficient documentation

## 2015-10-30 DIAGNOSIS — Z8669 Personal history of other diseases of the nervous system and sense organs: Secondary | ICD-10-CM | POA: Insufficient documentation

## 2015-10-30 DIAGNOSIS — M25572 Pain in left ankle and joints of left foot: Secondary | ICD-10-CM | POA: Diagnosis not present

## 2015-10-30 LAB — COMPREHENSIVE METABOLIC PANEL
ALT: 13 U/L — ABNORMAL LOW (ref 17–63)
ANION GAP: 8 (ref 5–15)
AST: 14 U/L — AB (ref 15–41)
Albumin: 2.5 g/dL — ABNORMAL LOW (ref 3.5–5.0)
Alkaline Phosphatase: 96 U/L (ref 38–126)
BUN: 11 mg/dL (ref 6–20)
CHLORIDE: 99 mmol/L — AB (ref 101–111)
CO2: 30 mmol/L (ref 22–32)
Calcium: 8.7 mg/dL — ABNORMAL LOW (ref 8.9–10.3)
Creatinine, Ser: 1.56 mg/dL — ABNORMAL HIGH (ref 0.61–1.24)
GFR, EST AFRICAN AMERICAN: 60 mL/min — AB (ref >60)
GFR, EST NON AFRICAN AMERICAN: 52 mL/min — AB (ref >60)
Glucose, Bld: 315 mg/dL — ABNORMAL HIGH (ref 65–99)
POTASSIUM: 3.5 mmol/L (ref 3.5–5.1)
Sodium: 137 mmol/L (ref 135–145)
Total Bilirubin: 0.4 mg/dL (ref 0.3–1.2)
Total Protein: 5.9 g/dL — ABNORMAL LOW (ref 6.5–8.1)

## 2015-10-30 LAB — CBC
HEMATOCRIT: 41.8 % (ref 39.0–52.0)
HEMOGLOBIN: 13.3 g/dL (ref 13.0–17.0)
MCH: 27.1 pg (ref 26.0–34.0)
MCHC: 31.8 g/dL (ref 30.0–36.0)
MCV: 85.3 fL (ref 78.0–100.0)
Platelets: 266 10*3/uL (ref 150–400)
RBC: 4.9 MIL/uL (ref 4.22–5.81)
RDW: 15.9 % — ABNORMAL HIGH (ref 11.5–15.5)
WBC: 8.8 10*3/uL (ref 4.0–10.5)

## 2015-10-30 LAB — LIPASE, BLOOD: LIPASE: 54 U/L — AB (ref 11–51)

## 2015-10-30 MED ORDER — HALOPERIDOL LACTATE 5 MG/ML IJ SOLN
2.0000 mg | Freq: Once | INTRAMUSCULAR | Status: AC
  Administered 2015-10-31: 2 mg via INTRAVENOUS
  Filled 2015-10-30: qty 1

## 2015-10-30 MED ORDER — MORPHINE SULFATE (PF) 4 MG/ML IV SOLN
8.0000 mg | Freq: Once | INTRAVENOUS | Status: AC
  Administered 2015-10-31: 8 mg via INTRAVENOUS
  Filled 2015-10-30: qty 2

## 2015-10-30 MED ORDER — METOCLOPRAMIDE HCL 5 MG/ML IJ SOLN
10.0000 mg | Freq: Once | INTRAMUSCULAR | Status: AC
  Administered 2015-10-30: 10 mg via INTRAVENOUS
  Filled 2015-10-30: qty 2

## 2015-10-30 MED ORDER — SODIUM CHLORIDE 0.9 % IV BOLUS (SEPSIS)
1000.0000 mL | Freq: Once | INTRAVENOUS | Status: AC
  Administered 2015-10-30: 1000 mL via INTRAVENOUS

## 2015-10-30 NOTE — ED Provider Notes (Signed)
CSN: [id number]     Arrival date & time 10/30/15  2203 History   None    Chief Complaint  Patient presents with  . Emesis    Patient is a 45 y.o. male presenting with vomiting and abdominal pain. The history is provided by the patient.  Emesis Severity:  Moderate Duration:  5 days Timing:  Intermittent Number of daily episodes:  6 Quality:  Stomach contents (nbnb) Feeding tolerance: none  Onset of vomiting after eating: soon. Progression:  Unchanged Chronicity:  Recurrent (cw prior gastroparesis pain) Recent urination:  Normal Associated symptoms: abdominal pain and diarrhea (mutliple times today, nonbloody)   Associated symptoms: no chills, no fever, no myalgias and no URI   Abdominal pain:    Location:  Epigastric   Pain quality: "nausea"   Onset quality:  Gradual   Duration:  5 days   Timing:  Intermittent   Progression:  Waxing and waning   Chronicity:  Recurrent (exactly like prior gastroparesis flares) Risk factors: diabetes (poorly controlled, compliant) and suspect food intake (maybe chicken?)   Risk factors: no alcohol use, no sick contacts and no travel to endemic areas   Abdominal Pain Associated symptoms: anorexia, chest pain (when he vomits, not exertional, felt like reflux), diarrhea (mutliple times today, nonbloody), flatus, nausea and vomiting   Associated symptoms: no chills, no constipation (last bm 1 hr ago), no cough, no dysuria, no fatigue, no fever, no hematemesis, no melena and no shortness of breath   Risk factors: no alcohol abuse, has not had multiple surgeries and no NSAID use    NM gastric emptying study confirmed gastroparesis  Past Medical History  Diagnosis Date  . Sickle cell trait (HCC)   . Diabetes (HCC)   . Obstructive sleep apnea     will not wear CPAP  . Asthma   . GERD (gastroesophageal reflux disease)   . Gastroparesis   . Kidney disorder   . Neuropathy (HCC)   . Obesity    Past Surgical History  Procedure Laterality Date  .  Hip fracture surgery Right 1999    "put a plate in"   Family History  Problem Relation Age of Onset  . Diabetes Father   . Neuropathy Father   . Hypertension Father   . Sickle cell anemia Mother   . Kidney disease Brother   . Kidney disease Sister   . Aneurysm Father   . Heart attack Mother    Social History  Substance Use Topics  . Smoking status: Never Smoker   . Smokeless tobacco: Never Used  . Alcohol Use: No    Review of Systems  Constitutional: Negative for fever, chills and fatigue.  HENT:       Sour taste in mouth  Respiratory: Negative for cough, shortness of breath and wheezing.   Cardiovascular: Positive for chest pain (when he vomits, not exertional, felt like reflux). Negative for leg swelling.  Gastrointestinal: Positive for nausea, vomiting, abdominal pain, diarrhea (mutliple times today, nonbloody), anorexia and flatus. Negative for constipation (last bm 1 hr ago), blood in stool, melena, abdominal distention and hematemesis.  Genitourinary: Negative for dysuria and decreased urine volume.  Musculoskeletal: Negative for myalgias and back pain.  Skin: Negative for rash.  Neurological: Negative for syncope.  All other systems reviewed and are negative.     Allergies  Angiotensin receptor blockers and Lisinopril  Home Medications   Prior to Admission medications   Medication Sig Start Date End Date Taking? Authorizing Provider  amLODipine (  NORVASC) 5 MG tablet Take 1 tablet (5 mg total) by mouth daily. 06/06/15  Yes Alison Murray, MD  dicyclomine (BENTYL) 20 MG tablet Take 1 tablet (20 mg total) by mouth 2 (two) times daily. 05/29/15  Yes Benjamin Cartner, PA-C  furosemide (LASIX) 40 MG tablet Take 40 mg by mouth 2 (two) times daily. 10/15/15  Yes Historical Provider, MD  hydrALAZINE (APRESOLINE) 50 MG tablet Take 1 tablet (50 mg total) by mouth 3 (three) times daily. 06/06/15  Yes Alison Murray, MD  insulin detemir (LEVEMIR) 100 UNIT/ML injection Inject 0.4  mLs (40 Units total) into the skin daily. Patient taking differently: Inject 80 Units into the skin every evening.  06/06/15  Yes Alison Murray, MD  LYRICA 100 MG capsule Take 100 mg by mouth 3 (three) times daily. 05/02/15  Yes Historical Provider, MD  metFORMIN (GLUCOPHAGE) 500 MG tablet Take 500 mg by mouth 3 (three) times daily. 09/22/15  Yes Historical Provider, MD  metoCLOPramide (REGLAN) 10 MG tablet Take 1-2 tablets (10-20 mg total) by mouth every 6 (six) hours as needed for nausea or vomiting. 07/29/15  Yes Derwood Kaplan, MD  metoprolol succinate (TOPROL-XL) 50 MG 24 hr tablet Take 50 mg by mouth daily. 10/18/15  Yes Historical Provider, MD  Oxycodone HCl 10 MG TABS Take 1 tablet by mouth every 6 (six) hours as needed (pain).  04/17/14  Yes Historical Provider, MD  potassium chloride SA (K-DUR,KLOR-CON) 20 MEQ tablet Take 20 mEq by mouth daily. 10/07/15  Yes Historical Provider, MD  traMADol (ULTRAM) 50 MG tablet Take 1 tablet (50 mg total) by mouth every 6 (six) hours as needed. Patient taking differently: Take 50 mg by mouth every 6 (six) hours as needed for moderate pain.  07/29/15  Yes Derwood Kaplan, MD  insulin aspart (NOVOLOG) 100 UNIT/ML injection Inject 10-20 Units into the skin 3 (three) times daily with meals. per sliding scale CBG 70 - 120: 0 units CBG 121 - 150: 2 units CBG 151 - 200: 3 units CBG 201 - 250: 5 units CBG 251 - 300: 8 units CBG 301 - 350: 11 units CBG 351 - 400: 15 units Patient not taking: Reported on 10/25/2015 06/06/15   Alison Murray, MD  insulin aspart (NOVOLOG) 100 UNIT/ML injection Inject 8 Units into the skin 3 (three) times daily with meals. Patient not taking: Reported on 07/29/2015 06/06/15   Alison Murray, MD  ondansetron (ZOFRAN ODT) 4 MG disintegrating tablet Take 1 tablet (4 mg total) by mouth every 8 (eight) hours as needed for nausea or vomiting. 10/31/15   Sofie Rower, MD   BP 171/100 mmHg  Pulse 98  Temp(Src) 98.3 F (36.8 C) (Oral)  Resp 15   Ht 5\' 11"  (1.803 m)  Wt 153.769 kg  BMI 47.30 kg/m2  SpO2 79% Physical Exam  Constitutional: He is oriented to person, place, and time. He appears well-developed and well-nourished.  In mild amount of pain.    HENT:  Head: Normocephalic and atraumatic.  Nose: Nose normal.  Eyes: Conjunctivae are normal.  Neck: Normal range of motion. Neck supple. No tracheal deviation present.  Cardiovascular: Normal rate, regular rhythm and normal heart sounds.   No murmur heard. Pulmonary/Chest: Effort normal and breath sounds normal. No respiratory distress. He has no rales.  No crepitus  Abdominal: Soft. Bowel sounds are normal. He exhibits no distension and no mass. There is tenderness (epigastric).  obese  Musculoskeletal: Normal range of motion. He exhibits tenderness (nonpitting  bilateral ankles). He exhibits no edema.  Neurological: He is alert and oriented to person, place, and time.  Skin: Skin is warm and dry. No rash noted.  Psychiatric: He has a normal mood and affect.  Nursing note and vitals reviewed.   ED Course  Procedures (including critical care time) Labs Review Labs Reviewed  LIPASE, BLOOD - Abnormal; Notable for the following:    Lipase 54 (*)    All other components within normal limits  COMPREHENSIVE METABOLIC PANEL - Abnormal; Notable for the following:    Chloride 99 (*)    Glucose, Bld 315 (*)    Creatinine, Ser 1.56 (*)    Calcium 8.7 (*)    Total Protein 5.9 (*)    Albumin 2.5 (*)    AST 14 (*)    ALT 13 (*)    GFR calc non Af Amer 52 (*)    GFR calc Af Amer 60 (*)    All other components within normal limits  CBC - Abnormal; Notable for the following:    RDW 15.9 (*)    All other components within normal limits  URINALYSIS, ROUTINE W REFLEX MICROSCOPIC (NOT AT [facility])    Imaging Review Dg Abd Acute W/chest  10/30/2015  CLINICAL DATA:  Acute onset of upper abdominal pain and vomiting. Diarrhea. Mid chest pain and shortness of breath. Initial encounter.  EXAM: DG ABDOMEN ACUTE W/ 1V CHEST COMPARISON:  Chest and abdominal radiographs performed 07/26/2015 FINDINGS: There is mild elevation of the right hemidiaphragm. Vascular congestion is noted. Bibasilar airspace opacities raise concern for mild interstitial edema. There is no evidence of pleural effusion or pneumothorax. The cardiomediastinal silhouette is borderline enlarged. The visualized bowel gas pattern is unremarkable. Scattered stool and air are seen within the colon; there is no evidence of small bowel dilatation to suggest obstruction. No free intra-abdominal air is identified on the provided upright view. No acute osseous abnormalities are seen; the sacroiliac joints are unremarkable in appearance. Postoperative change is noted along the right ilium and right ischium. IMPRESSION: 1. Mild elevation of the right hemidiaphragm. Vascular congestion and borderline cardiomegaly noted. Bibasilar airspace opacities raise concern for mild interstitial edema. 2. Unremarkable bowel gas pattern; no free intra-abdominal air seen. Small to moderate amount of stool noted in the colon. Electronically Signed   By: Roanna Raider M.D.   On: 10/30/2015 23:17   I have personally reviewed and evaluated these images and lab results as part of my medical decision-making.   EKG Interpretation   Date/Time:  Wednesday Oct 30 2015 22:33:54 EDT Ventricular Rate:  87 PR Interval:  170 QRS Duration: 102 QT Interval:  403 QTC Calculation: 485 R Axis:   -32 Text Interpretation:  Sinus rhythm Left axis deviation Consider anterior  infarct No significant change since last tracing Confirmed by KNAPP  MD-J,  JON ([id number]) on 10/30/2015 11:40:44 PM      MDM   Final diagnoses:  Epigastric pain  Morbid obesity due to excess calories (HCC)   I reviewed sibling past medical history involving point until diabetes and gastroparesis with multiple ED presentations for similar subjective history as detailed above. Vital signs  stable. He is well-appearing. Abdomen without signs of peritonitis or systemic toxicity to suggest surgical emergency. Oxygen requirement 2 L is intermittent this patient and not new. Abdominal pain been typical of prior flares.. Lipase without evidence of otitis. No evidence of obstructive gallbladder pathology by labs.  EKG obtained given upper epigastric/ chest pain after bouts of  emesis.  No ischemic changes. Chest x-ray without evidence of free air or evidence of Boerhaave's. No PTX. Given the benign exam and relatively normal labs I think CT imaging would be low yield due to very low suspicion for surgical or infectious pathology. Not in DKA.    12:35 AM pain better controlled.  No vomiting during ED course.  Desats to low 80 while asleep, easily awakens and SpO2 comes up to mid 90s on baseline 2L [place].  Wears CPAP when he sleeps normally.    12:53 AM No pain.  No nv.  Cleared for dc home.  While awake, he had SpO2 95% on 2.5L [place].  He voices no complaints.  ABle to stay alert without much intervention.  Will go home with O2.  Use CPAP as normally prescribed.  Counseled on strict return precautions.  Family taking him home with his O2 tank.    Sofie Rower, MD 10/31/15 1610  Sofie Rower, MD 10/31/15 [id number]  Linwood Dibbles, MD 10/31/15 1024

## 2015-10-30 NOTE — ED Notes (Signed)
Pt arrives via EMS from home with c/o upper abdominal pain and vomiting x5 days, seen in ED for same. States didn't get any better b/c he didn't fill zofran RX d/t cost. Emesis x6 times today. Diarrhea x4 times today.

## 2015-10-30 NOTE — ED Notes (Signed)
EMS gave 4MG  zofran, effective for nausea.

## 2015-10-31 DIAGNOSIS — R1013 Epigastric pain: Secondary | ICD-10-CM | POA: Diagnosis not present

## 2015-10-31 MED ORDER — FUROSEMIDE 10 MG/ML IJ SOLN
20.0000 mg | Freq: Once | INTRAMUSCULAR | Status: AC
  Administered 2015-10-31: 20 mg via INTRAVENOUS
  Filled 2015-10-31: qty 2

## 2015-10-31 MED ORDER — ONDANSETRON HCL 4 MG/2ML IJ SOLN: 4.0000 mg | Freq: Once | INTRAMUSCULAR | Status: DC

## 2015-10-31 MED ORDER — ONDANSETRON 4 MG PO TBDP: 4.0000 mg | ORAL_TABLET | Freq: Three times a day (TID) | ORAL | Status: DC | PRN

## 2015-10-31 NOTE — ED Provider Notes (Signed)
Medical screening examination/treatment/procedure(s) were conducted as a shared visit with non-physician practitioner(s) and myself.  I personally evaluated the patient during the encounter.   EKG Interpretation   Date/Time:  Wednesday Oct 30 2015 22:33:54 EDT Ventricular Rate:  87 PR Interval:  170 QRS Duration: 102 QT Interval:  403 QTC Calculation: 485 R Axis:   -32 Text Interpretation:  Sinus rhythm Left axis deviation Consider anterior  infarct No significant change since last tracing Confirmed by Raksha Wolfgang  MD-J,  Jaquanda Wickersham ([id number]) on 10/30/2015 11:40:44 PM      Pt has history of recurrent abdominal pain and vomiting attributed to gastroparesis.  Pt states he has had multiple episodes of vomiting and diarrhea.  Car RRR Lungs CTA On exam abdomen is soft, diffuse tenderness Extrem peripheral edema  Will check labs, aas.  Symptomatic care.  Suspect this is a recurrent episode of his recurrent abdominal pain and vomiting.    Linwood DibblesJon Kellsey Sansone, MD 10/31/15 [phone]

## 2015-10-31 NOTE — ED Notes (Signed)
Pt left at this time with all belongings.  

## 2015-10-31 NOTE — Discharge Instructions (Signed)

## 2015-11-02 ENCOUNTER — Emergency Department (HOSPITAL_COMMUNITY): Payer: Medicare Other

## 2015-11-02 ENCOUNTER — Emergency Department (HOSPITAL_COMMUNITY)
Admission: EM | Admit: 2015-11-02 | Discharge: 2015-11-02 | Disposition: A | Discharge status: Home / Self Care | Payer: Medicare Other | Attending: Emergency Medicine | Admitting: Emergency Medicine

## 2015-11-02 ENCOUNTER — Encounter (HOSPITAL_COMMUNITY): Payer: Self-pay

## 2015-11-02 ENCOUNTER — Encounter (HOSPITAL_COMMUNITY): Payer: Self-pay | Admitting: Oncology

## 2015-11-02 ENCOUNTER — Emergency Department (HOSPITAL_COMMUNITY)
Admission: EM | Admit: 2015-11-02 | Discharge: 2015-11-03 | Disposition: A | Discharge status: Home / Self Care | Payer: Medicare Other | Source: Home / Self Care | Attending: Emergency Medicine | Admitting: Emergency Medicine

## 2015-11-02 DIAGNOSIS — J45909 Unspecified asthma, uncomplicated: Secondary | ICD-10-CM

## 2015-11-02 DIAGNOSIS — E1143 Type 2 diabetes mellitus with diabetic autonomic (poly)neuropathy: Secondary | ICD-10-CM

## 2015-11-02 DIAGNOSIS — Z79891 Long term (current) use of opiate analgesic: Secondary | ICD-10-CM

## 2015-11-02 DIAGNOSIS — Z7984 Long term (current) use of oral hypoglycemic drugs: Secondary | ICD-10-CM | POA: Insufficient documentation

## 2015-11-02 DIAGNOSIS — E1140 Type 2 diabetes mellitus with diabetic neuropathy, unspecified: Secondary | ICD-10-CM

## 2015-11-02 DIAGNOSIS — R1084 Generalized abdominal pain: Secondary | ICD-10-CM | POA: Diagnosis present

## 2015-11-02 DIAGNOSIS — K3184 Gastroparesis: Secondary | ICD-10-CM

## 2015-11-02 DIAGNOSIS — I509 Heart failure, unspecified: Secondary | ICD-10-CM | POA: Diagnosis not present

## 2015-11-02 DIAGNOSIS — Z794 Long term (current) use of insulin: Secondary | ICD-10-CM

## 2015-11-02 DIAGNOSIS — Z79899 Other long term (current) drug therapy: Secondary | ICD-10-CM | POA: Insufficient documentation

## 2015-11-02 LAB — COMPREHENSIVE METABOLIC PANEL
ALBUMIN: 2.4 g/dL — AB (ref 3.5–5.0)
ALT: 11 U/L — ABNORMAL LOW (ref 17–63)
ANION GAP: 7 (ref 5–15)
AST: 13 U/L — ABNORMAL LOW (ref 15–41)
Alkaline Phosphatase: 85 U/L (ref 38–126)
BUN: 15 mg/dL (ref 6–20)
CO2: 30 mmol/L (ref 22–32)
Calcium: 8.3 mg/dL — ABNORMAL LOW (ref 8.9–10.3)
Chloride: 99 mmol/L — ABNORMAL LOW (ref 101–111)
Creatinine, Ser: 2.01 mg/dL — ABNORMAL HIGH (ref 0.61–1.24)
GFR calc non Af Amer: 38 mL/min — ABNORMAL LOW (ref >60)
GFR, EST AFRICAN AMERICAN: 44 mL/min — AB (ref >60)
GLUCOSE: 267 mg/dL — AB (ref 65–99)
POTASSIUM: 3.3 mmol/L — AB (ref 3.5–5.1)
SODIUM: 136 mmol/L (ref 135–145)
TOTAL PROTEIN: 5.8 g/dL — AB (ref 6.5–8.1)
Total Bilirubin: 0.3 mg/dL (ref 0.3–1.2)

## 2015-11-02 LAB — URINALYSIS, ROUTINE W REFLEX MICROSCOPIC
BILIRUBIN URINE: NEGATIVE
Glucose, UA: 500 mg/dL — AB
Ketones, ur: NEGATIVE mg/dL
Leukocytes, UA: NEGATIVE
NITRITE: NEGATIVE
PH: 6 (ref 5.0–8.0)
Protein, ur: >300 mg/dL — AB
SPECIFIC GRAVITY, URINE: 1.017 (ref 1.005–1.030)

## 2015-11-02 LAB — URINE MICROSCOPIC-ADD ON: WBC UA: NONE SEEN WBC/hpf (ref 0–5)

## 2015-11-02 LAB — CBC
HEMATOCRIT: 39.4 % (ref 39.0–52.0)
HEMOGLOBIN: 12.6 g/dL — AB (ref 13.0–17.0)
MCH: 27 pg (ref 26.0–34.0)
MCHC: 32 g/dL (ref 30.0–36.0)
MCV: 84.4 fL (ref 78.0–100.0)
Platelets: 251 10*3/uL (ref 150–400)
RBC: 4.67 MIL/uL (ref 4.22–5.81)
RDW: 15.6 % — ABNORMAL HIGH (ref 11.5–15.5)
WBC: 9.6 10*3/uL (ref 4.0–10.5)

## 2015-11-02 LAB — LIPASE, BLOOD: LIPASE: 111 U/L — AB (ref 11–51)

## 2015-11-02 MED ORDER — DIPHENHYDRAMINE HCL 50 MG/ML IJ SOLN
25.0000 mg | Freq: Once | INTRAMUSCULAR | Status: AC
  Administered 2015-11-02: 25 mg via INTRAVENOUS
  Filled 2015-11-02: qty 1

## 2015-11-02 MED ORDER — HALOPERIDOL LACTATE 5 MG/ML IJ SOLN
5.0000 mg | Freq: Once | INTRAMUSCULAR | Status: AC
  Administered 2015-11-02: 5 mg via INTRAVENOUS
  Filled 2015-11-02: qty 1

## 2015-11-02 MED ORDER — METOCLOPRAMIDE HCL 10 MG PO TABS: 10.0000 mg | ORAL_TABLET | Freq: Four times a day (QID) | ORAL | Status: DC | PRN

## 2015-11-02 NOTE — ED Notes (Signed)
[facility], C/O abd pain and nausea that has been going on for two days, pt has hx of gastroparesis. Unable to eat or drink, pt was unable to fill his prescription for zofran from the last time he was here.

## 2015-11-02 NOTE — ED Notes (Signed)
Pt denies CP.  States he is experiencing his usual pain associated w/ gastroparesis.

## 2015-11-02 NOTE — Discharge Instructions (Signed)
Gastroparesis °Gastroparesis, also called delayed gastric emptying, is a condition in which food takes longer than normal to empty from the stomach. The condition is usually long-lasting (chronic). °CAUSES °This condition may be caused by: °· An endocrine disorder, such as hypothyroidism or diabetes. Diabetes is the most common cause of this condition. °· A nervous system disease, such as Parkinson disease or multiple sclerosis. °· Cancer, infection, or surgery of the stomach or vagus nerve. °· A connective tissue disorder, such as scleroderma. °· Certain medicines. °In most cases, the cause is not known. °RISK FACTORS °This condition is more likely to develop in: °· People with certain disorders, including endocrine disorders, eating disorders, amyloidosis, and scleroderma. °· People with certain diseases, including Parkinson disease or multiple sclerosis. °· People with cancer or infection of the stomach or vagus nerve. °· People who have had surgery on the stomach or vagus nerve. °· People who take certain medicines. °· Women. °SYMPTOMS °Symptoms of this condition include: °· An early feeling of fullness when eating. °· Nausea. °· Weight loss. °· Vomiting. °· Heartburn. °· Abdominal bloating. °· Inconsistent blood glucose levels. °· Lack of appetite. °· Acid from the stomach coming up into the esophagus (gastroesophageal reflux). °· Spasms of the stomach. °Symptoms may come and go. °DIAGNOSIS °This condition is diagnosed with tests, such as: °· Tests that check how long it takes food to move through the stomach and intestines. These tests include: °¨ Upper gastrointestinal (GI) series. In this test, X-rays of the intestines are taken after you drink a liquid. The liquid makes the intestines show up better on the X-rays. °¨ Gastric emptying scintigraphy. In this test, scans are taken after you eat food that contains a small amount of radioactive material. °¨ Wireless capsule GI monitoring system. This test  involves swallowing a capsule that records information about movement through the stomach. °· Gastric manometry. This test measures electrical and muscular activity in the stomach. It is done with a thin tube that is passed down the throat and into the stomach. °· Endoscopy. This test checks for abnormalities in the lining of the stomach. It is done with a long, thin tube that is passed down the throat and into the stomach. °· An ultrasound. This test can help rule out gallbladder disease or pancreatitis as a cause of your symptoms. It uses sound waves to take pictures of the inside of your body. °TREATMENT °There is no cure for gastroparesis. This condition may be managed with: °· Treatment of the underlying condition causing the gastroparesis. °· Lifestyle changes, including exercise and dietary changes. Dietary changes can include: °¨ Changes in what and when you eat. °¨ Eating smaller meals more often. °¨ Eating low-fat foods. °¨ Eating low-fiber forms of high-fiber foods, such as cooked vegetables instead of raw vegetables. °¨ Having liquid foods in place of solid foods. Liquid foods are easier to digest. °· Medicines. These may be given to control nausea and vomiting and to stimulate stomach muscles. °· Getting food through a feeding tube. This may be done in severe cases. °· A gastric neurostimulator. This is a device that is inserted into the body with surgery. It helps improve stomach emptying and control nausea and vomiting. °HOME CARE INSTRUCTIONS °· Follow your health care provider's instructions about exercise and diet. °· Take medicines only as directed by your health care provider. °SEEK MEDICAL CARE IF: °· Your symptoms do not improve with treatment. °· You have new symptoms. °SEEK IMMEDIATE MEDICAL CARE IF: °· You have   severe abdominal pain that does not improve with treatment. °· You have nausea that does not go away. °· You cannot keep fluids down. °  °This information is not intended to replace  advice given to you by your health care provider. Make sure you discuss any questions you have with your health care provider. °  °Document Released: 05/25/2005 Document Revised: 10/09/2014 Document Reviewed: 05/21/2014 °Elsevier Interactive Patient Education ©2016 [employer] ° °

## 2015-11-02 NOTE — ED Notes (Signed)
Per EMS pt c/o abdominal pain, "heart pain" and hyperglycemia.  Pt's CBG was 318.  [facility]

## 2015-11-02 NOTE — ED Provider Notes (Signed)
CSN: [id number]     Arrival date & time 11/02/15  0026 History  By signing my name below, I, [name], attest that this documentation has been prepared under the direction and in the presence of Gilda Crease, MD. Electronically Signed: [name], ED Scribe. 11/02/2015. 12:44 AM.    Chief Complaint  Patient presents with  . Abdominal Pain  . Nausea   Patient is a 45 y.o. male presenting with abdominal pain. The history is provided by the patient. No language interpreter was used.  Abdominal Pain Pain location:  Generalized Pain radiates to:  Does not radiate Pain severity:  Moderate Onset quality:  Gradual Duration:  2 days Timing:  Constant Progression:  Worsening Chronicity:  Recurrent Relieved by:  None tried Worsened by:  Nothing tried Ineffective treatments:  None tried Associated symptoms: nausea   Associated symptoms: no diarrhea and no fever    HPI Comments: Kenneth Fox is a 45 y.o. male with a hx of DM, GERD, Gastroparesis, and CHF who presents to the Emergency Department complaining of ongoing gradually worsening generalized abdominal pain onset 2 days ago. Pt reports associated nausea and difficulty eating/drinking appropriately. No alleviating factors noted. Pt has not tried any medications PTA. He was seen here on 10/30/15 for the same symptoms where he was discharged with no pain. No fever, chills, or diarrhea.    Past Medical History  Diagnosis Date  . Sickle cell trait (HCC)   . Diabetes (HCC)   . Obstructive sleep apnea     will not wear CPAP  . Asthma   . GERD (gastroesophageal reflux disease)   . Gastroparesis   . Kidney disorder   . Neuropathy (HCC)   . Obesity    Past Surgical History  Procedure Laterality Date  . Hip fracture surgery Right 1999    "put a plate in"   Family History  Problem Relation Age of Onset  . Diabetes Father   . Neuropathy Father   . Hypertension Father   . Sickle cell anemia Mother   . Kidney  disease Brother   . Kidney disease Sister   . Aneurysm Father   . Heart attack Mother    Social History  Substance Use Topics  . Smoking status: Never Smoker   . Smokeless tobacco: Never Used  . Alcohol Use: No    Review of Systems  Constitutional: Negative for fever.  Gastrointestinal: Positive for nausea and abdominal pain. Negative for diarrhea.  All other systems reviewed and are negative.     Allergies  Angiotensin receptor blockers and Lisinopril  Home Medications   Prior to Admission medications   Medication Sig Start Date End Date Taking? Authorizing Provider  amLODipine (NORVASC) 5 MG tablet Take 1 tablet (5 mg total) by mouth daily. 06/06/15  Yes Alison Murray, MD  dicyclomine (BENTYL) 20 MG tablet Take 1 tablet (20 mg total) by mouth 2 (two) times daily. 05/29/15  Yes Benjamin Cartner, PA-C  furosemide (LASIX) 40 MG tablet Take 40 mg by mouth 2 (two) times daily. 10/15/15  Yes Historical Provider, MD  hydrALAZINE (APRESOLINE) 50 MG tablet Take 1 tablet (50 mg total) by mouth 3 (three) times daily. 06/06/15  Yes Alison Murray, MD  insulin detemir (LEVEMIR) 100 UNIT/ML injection Inject 0.4 mLs (40 Units total) into the skin daily. Patient taking differently: Inject 80 Units into the skin every evening.  06/06/15  Yes Alison Murray, MD  LYRICA 100 MG capsule Take 100 mg by  mouth 3 (three) times daily. 05/02/15  Yes Historical Provider, MD  metFORMIN (GLUCOPHAGE) 500 MG tablet Take 500 mg by mouth 3 (three) times daily. 09/22/15  Yes Historical Provider, MD  metoprolol succinate (TOPROL-XL) 50 MG 24 hr tablet Take 50 mg by mouth daily. 10/18/15  Yes Historical Provider, MD  Oxycodone HCl 10 MG TABS Take 1 tablet by mouth every 6 (six) hours as needed (pain).  04/17/14  Yes Historical Provider, MD  potassium chloride SA (K-DUR,KLOR-CON) 20 MEQ tablet Take 20 mEq by mouth daily. 10/07/15  Yes Historical Provider, MD  traMADol (ULTRAM) 50 MG tablet Take 1 tablet (50 mg total) by  mouth every 6 (six) hours as needed. Patient taking differently: Take 50 mg by mouth every 6 (six) hours as needed for moderate pain.  07/29/15  Yes Derwood Kaplan, MD  insulin aspart (NOVOLOG) 100 UNIT/ML injection Inject 10-20 Units into the skin 3 (three) times daily with meals. per sliding scale CBG 70 - 120: 0 units CBG 121 - 150: 2 units CBG 151 - 200: 3 units CBG 201 - 250: 5 units CBG 251 - 300: 8 units CBG 301 - 350: 11 units CBG 351 - 400: 15 units Patient not taking: Reported on 10/25/2015 06/06/15   Alison Murray, MD  metoCLOPramide (REGLAN) 10 MG tablet Take 1 tablet (10 mg total) by mouth every 6 (six) hours as needed for nausea (nausea/headache). 11/02/15   Gilda Crease, MD  ondansetron (ZOFRAN ODT) 4 MG disintegrating tablet Take 1 tablet (4 mg total) by mouth every 8 (eight) hours as needed for nausea or vomiting. Patient not taking: Reported on 11/02/2015 10/31/15   Sofie Rower, MD   BP 154/139 mmHg  Pulse 89  Temp(Src) 98.6 F (37 C) (Oral)  Resp 28  Ht 5\' 7"  (1.702 m)  Wt 340 lb (154.223 kg)  BMI 53.24 kg/m2  SpO2 96% Physical Exam  Constitutional: He is oriented to person, place, and time. He appears well-developed and well-nourished. No distress.  HENT:  Head: Normocephalic and atraumatic.  Right Ear: Hearing normal.  Left Ear: Hearing normal.  Nose: Nose normal.  Mouth/Throat: Oropharynx is clear and moist and mucous membranes are normal.  Eyes: Conjunctivae and EOM are normal. Pupils are equal, round, and reactive to light.  Neck: Normal range of motion. Neck supple.  Cardiovascular: Regular rhythm, S1 normal and S2 normal.  Exam reveals no gallop and no friction rub.   No murmur heard. Pulmonary/Chest: Effort normal and breath sounds normal. No respiratory distress. He exhibits no tenderness.  Abdominal: Soft. Normal appearance and bowel sounds are normal. There is no hepatosplenomegaly. There is tenderness. There is no rebound, no guarding, no  tenderness at McBurney's point and negative Murphy's sign. No hernia.  Generalized abdominal pain   Musculoskeletal: Normal range of motion.  Pitting edema BLE  Neurological: He is alert and oriented to person, place, and time. He has normal strength. No cranial nerve deficit or sensory deficit. Coordination normal. GCS eye subscore is 4. GCS verbal subscore is 5. GCS motor subscore is 6.  Skin: Skin is warm, dry and intact. No rash noted. No cyanosis.  Psychiatric: He has a normal mood and affect. His speech is normal and behavior is normal. Thought content normal.  Nursing note and vitals reviewed.   ED Course  Procedures (including critical care time) DIAGNOSTIC STUDIES: Oxygen Saturation is 94% on [place], adequate by my interpretation.    COORDINATION OF CARE: 12:37 AM- Pt advised of plan  for treatment and pt agrees. Pt will receive chest x-ray and lab work for further evaluation. He will also receive Haldol and Benadryl.    Labs Review Labs Reviewed  LIPASE, BLOOD - Abnormal; Notable for the following:    Lipase 111 (*)    All other components within normal limits  COMPREHENSIVE METABOLIC PANEL - Abnormal; Notable for the following:    Potassium 3.3 (*)    Chloride 99 (*)    Glucose, Bld 267 (*)    Creatinine, Ser 2.01 (*)    Calcium 8.3 (*)    Total Protein 5.8 (*)    Albumin 2.4 (*)    AST 13 (*)    ALT 11 (*)    GFR calc non Af Amer 38 (*)    GFR calc Af Amer 44 (*)    All other components within normal limits  CBC - Abnormal; Notable for the following:    Hemoglobin 12.6 (*)    RDW 15.6 (*)    All other components within normal limits  URINALYSIS, ROUTINE W REFLEX MICROSCOPIC (NOT AT [facility]) - Abnormal; Notable for the following:    Glucose, UA 500 (*)    Hgb urine dipstick TRACE (*)    Protein, ur >300 (*)    All other components within normal limits  URINE MICROSCOPIC-ADD ON - Abnormal; Notable for the following:    Squamous Epithelial / LPF 0-5 (*)    Bacteria, UA  RARE (*)    Casts GRANULAR CAST (*)    All other components within normal limits    Imaging Review Dg Abd Acute W/chest  11/02/2015  CLINICAL DATA:  Acute onset of generalized abdominal pain and nausea. Initial encounter. EXAM: DG ABDOMEN ACUTE W/ 1V CHEST COMPARISON:  Abdominal and chest radiographs performed 10/30/2015 FINDINGS: The lungs are well-aerated. Bibasilar airspace opacities likely reflect atelectasis. Mild vascular congestion is seen. No pleural effusion or pneumothorax is identified. The cardiomediastinal silhouette is borderline enlarged. The visualized bowel gas pattern is unremarkable. Scattered stool and air are seen within the colon; there is no evidence of small bowel dilatation to suggest obstruction. No free intra-abdominal air is identified on the provided upright view. No acute osseous abnormalities are seen; the sacroiliac joints are unremarkable in appearance. A plate and screws are noted along the right ischium and ilium. IMPRESSION: 1. Unremarkable bowel gas pattern; no free intra-abdominal air seen. Small amount of stool noted in the colon. 2. Bibasilar airspace opacities likely reflect atelectasis. Vascular congestion and borderline cardiomegaly noted. Electronically Signed   By: Roanna Raider M.D.   On: 11/02/2015 01:25     Gilda Crease, MD has personally reviewed and evaluated these images and lab results as part of his medical decision-making.   EKG Interpretation   Date/Time:  Saturday Nov 02 2015 01:16:03 EDT Ventricular Rate:  88 PR Interval:  155 QRS Duration: 109 QT Interval:  396 QTC Calculation: 479 R Axis:   122 Text Interpretation:  Sinus rhythm Low voltage with right axis deviation  Consider anterior infarct No significant change since last tracing  Confirmed by Nastashia Gallo  MD, Kylan Veach [phone]) on 11/02/2015 1:23:24 AM      MDM   Final diagnoses:  Gastroparesis    Patient well-known to this emergency department for frequent visits  for abdominal pain with nausea and vomiting secondary to gastroparesis. This is the third visit this week. Patient has not filled any prescriptions after being discharged from the emergency department. Abdominal exam was benign. He does  have a history congestive heart failure, however, does not appear to be decompensated. Acute abdominal x-ray with chest does not reveal any evidence of congestive heart failure or obstruction. Because of his tenuous volume status, however, IV fluids were not administered. Patient minister IV Haldol and Benadryl, as he seems to have done well with these medications in the past.  Patient has a normal CBC. Creatinine is slightly above his baseline, likely indicating some element of mild dehydration lipase is also mildly elevated, has been chronically elevated for the past few visits. I do not see any indication for admission and the patient. He is tolerating oral intake. He will be discharged with medications for symptomatic treatment of his gastroparesis.  I personally performed the services described in this documentation, which was scribed in my presence. The recorded information has been reviewed and is accurate.   Gilda Creasehristopher J Aashish Hamm, MD 11/02/15 [phone]

## 2015-11-03 ENCOUNTER — Encounter (HOSPITAL_COMMUNITY): Payer: Self-pay | Admitting: Emergency Medicine

## 2015-11-03 DIAGNOSIS — K3184 Gastroparesis: Secondary | ICD-10-CM | POA: Diagnosis not present

## 2015-11-03 LAB — URINALYSIS, ROUTINE W REFLEX MICROSCOPIC
Bilirubin Urine: NEGATIVE
KETONES UR: NEGATIVE mg/dL
Leukocytes, UA: NEGATIVE
Nitrite: NEGATIVE
Specific Gravity, Urine: 1.014 (ref 1.005–1.030)
pH: 7 (ref 5.0–8.0)

## 2015-11-03 LAB — CBC WITH DIFFERENTIAL/PLATELET
BASOS ABS: 0 10*3/uL (ref 0.0–0.1)
Basophils Relative: 0 %
Eosinophils Absolute: 0.1 10*3/uL (ref 0.0–0.7)
Eosinophils Relative: 1 %
HEMATOCRIT: 41.2 % (ref 39.0–52.0)
Hemoglobin: 13.3 g/dL (ref 13.0–17.0)
LYMPHS PCT: 9 %
Lymphs Abs: 1 10*3/uL (ref 0.7–4.0)
MCH: 27.2 pg (ref 26.0–34.0)
MCHC: 32.3 g/dL (ref 30.0–36.0)
MCV: 84.3 fL (ref 78.0–100.0)
MONO ABS: 0.7 10*3/uL (ref 0.1–1.0)
Monocytes Relative: 6 %
NEUTROS ABS: 8.9 10*3/uL — AB (ref 1.7–7.7)
Neutrophils Relative %: 84 %
Platelets: 258 10*3/uL (ref 150–400)
RBC: 4.89 MIL/uL (ref 4.22–5.81)
RDW: 15.5 % (ref 11.5–15.5)
WBC: 10.6 10*3/uL — ABNORMAL HIGH (ref 4.0–10.5)

## 2015-11-03 LAB — COMPREHENSIVE METABOLIC PANEL
ALK PHOS: 97 U/L (ref 38–126)
ALT: 14 U/L — AB (ref 17–63)
AST: 15 U/L (ref 15–41)
Albumin: 2.9 g/dL — ABNORMAL LOW (ref 3.5–5.0)
Anion gap: 6 (ref 5–15)
BILIRUBIN TOTAL: 0.4 mg/dL (ref 0.3–1.2)
BUN: 10 mg/dL (ref 6–20)
CALCIUM: 8.7 mg/dL — AB (ref 8.9–10.3)
CO2: 33 mmol/L — ABNORMAL HIGH (ref 22–32)
CREATININE: 1.37 mg/dL — AB (ref 0.61–1.24)
Chloride: 100 mmol/L — ABNORMAL LOW (ref 101–111)
GFR calc Af Amer: >60 mL/min (ref >60)
Glucose, Bld: 328 mg/dL — ABNORMAL HIGH (ref 65–99)
Potassium: 3.3 mmol/L — ABNORMAL LOW (ref 3.5–5.1)
Sodium: 139 mmol/L (ref 135–145)
TOTAL PROTEIN: 6.7 g/dL (ref 6.5–8.1)

## 2015-11-03 LAB — URINE MICROSCOPIC-ADD ON
Bacteria, UA: NONE SEEN
RBC / HPF: NONE SEEN RBC/hpf (ref 0–5)
WBC, UA: NONE SEEN WBC/hpf (ref 0–5)

## 2015-11-03 LAB — LIPASE, BLOOD: LIPASE: 105 U/L — AB (ref 11–51)

## 2015-11-03 MED ORDER — SODIUM CHLORIDE 0.9 % IV BOLUS (SEPSIS)
500.0000 mL | Freq: Once | INTRAVENOUS | Status: AC
  Administered 2015-11-03: 500 mL via INTRAVENOUS

## 2015-11-03 MED ORDER — ONDANSETRON 8 MG PO TBDP: 8.0000 mg | ORAL_TABLET | Freq: Once | ORAL | Status: DC

## 2015-11-03 MED ORDER — KETOROLAC TROMETHAMINE 30 MG/ML IJ SOLN
30.0000 mg | Freq: Once | INTRAMUSCULAR | Status: AC
  Administered 2015-11-03: 30 mg via INTRAVENOUS
  Filled 2015-11-03: qty 1

## 2015-11-03 MED ORDER — HALOPERIDOL LACTATE 5 MG/ML IJ SOLN
5.0000 mg | Freq: Once | INTRAMUSCULAR | Status: AC
  Administered 2015-11-03: 5 mg via INTRAVENOUS
  Filled 2015-11-03: qty 1

## 2015-11-03 NOTE — ED Provider Notes (Signed)
CSN: [id number]     Arrival date & time 11/02/15  2322 History   By signing my name below, I, Suzan Slick. Elon Spanner, attest that this documentation has been prepared under the direction and in the presence of Janai Brannigan, MD.  Electronically Signed: Suzan Slick. Elon Spanner, ED Scribe. 11/03/2015. 1:59 AM.   Chief Complaint  Patient presents with  . Abdominal Pain    Patient is a 45 y.o. male presenting with abdominal pain. The history is provided by the patient. No language interpreter was used.  Abdominal Pain Pain location:  Generalized Pain radiates to:  Does not radiate Pain severity:  Severe Onset quality:  Gradual Duration:  1 day Timing:  Constant Progression:  Unchanged Chronicity:  Chronic Relieved by:  Nothing Worsened by:  Nothing tried Ineffective treatments:  None tried Associated symptoms: nausea and vomiting   Associated symptoms: no chest pain, no chills, no cough, no diarrhea, no fever and no shortness of breath     HPI Comments: Kenneth Fox is a 45 y.o. male with PMHx of DM, Gastroparesis, and Sickle Cell trait who presents to the Emergency Department complaining of constant, unchanged, diffuse abdominal pain that is chronic in nature. No aggravating or alleviating factors. He reports having 5-6 episodes of emesis within the last 24 hours. Pt was recently seen earlier today in the ED for same complaint. At that time, pt was safely discharged home with pain medication for gastroparesis flare.   PCP: Mike Craze    Past Medical History  Diagnosis Date  . Sickle cell trait (HCC)   . Diabetes (HCC)   . Obstructive sleep apnea     will not wear CPAP  . Asthma   . GERD (gastroesophageal reflux disease)   . Gastroparesis   . Kidney disorder   . Neuropathy (HCC)   . Obesity    Past Surgical History  Procedure Laterality Date  . Hip fracture surgery Right 1999    "put a plate in"   Family History  Problem Relation Age of Onset  . Diabetes Father   .  Neuropathy Father   . Hypertension Father   . Sickle cell anemia Mother   . Kidney disease Brother   . Kidney disease Sister   . Aneurysm Father   . Heart attack Mother    Social History  Substance Use Topics  . Smoking status: Never Smoker   . Smokeless tobacco: Never Used  . Alcohol Use: No    Review of Systems  Constitutional: Negative for fever and chills.  Respiratory: Negative for cough and shortness of breath.   Cardiovascular: Negative for chest pain.  Gastrointestinal: Positive for nausea, vomiting and abdominal pain. Negative for diarrhea.  Psychiatric/Behavioral: Negative for confusion.  All other systems reviewed and are negative.     Allergies  Angiotensin receptor blockers and Lisinopril  Home Medications   Prior to Admission medications   Medication Sig Start Date End Date Taking? Authorizing Provider  amLODipine (NORVASC) 5 MG tablet Take 1 tablet (5 mg total) by mouth daily. 06/06/15   Alison Murray, MD  dicyclomine (BENTYL) 20 MG tablet Take 1 tablet (20 mg total) by mouth 2 (two) times daily. 05/29/15   Joycie Peek, PA-C  furosemide (LASIX) 40 MG tablet Take 40 mg by mouth 2 (two) times daily. 10/15/15   Historical Provider, MD  hydrALAZINE (APRESOLINE) 50 MG tablet Take 1 tablet (50 mg total) by mouth 3 (three) times daily. 06/06/15   Alison Murray, MD  insulin aspart (NOVOLOG) 100 UNIT/ML injection Inject 10-20 Units into the skin 3 (three) times daily with meals. per sliding scale CBG 70 - 120: 0 units CBG 121 - 150: 2 units CBG 151 - 200: 3 units CBG 201 - 250: 5 units CBG 251 - 300: 8 units CBG 301 - 350: 11 units CBG 351 - 400: 15 units Patient not taking: Reported on 10/25/2015 06/06/15   Alison Murray, MD  insulin detemir (LEVEMIR) 100 UNIT/ML injection Inject 0.4 mLs (40 Units total) into the skin daily. Patient taking differently: Inject 80 Units into the skin every evening.  06/06/15   Alison Murray, MD  LYRICA 100 MG capsule Take 100 mg  by mouth 3 (three) times daily. 05/02/15   Historical Provider, MD  metFORMIN (GLUCOPHAGE) 500 MG tablet Take 500 mg by mouth 3 (three) times daily. 09/22/15   Historical Provider, MD  metoCLOPramide (REGLAN) 10 MG tablet Take 1 tablet (10 mg total) by mouth every 6 (six) hours as needed for nausea (nausea/headache). 11/02/15   Gilda Crease, MD  metoprolol succinate (TOPROL-XL) 50 MG 24 hr tablet Take 50 mg by mouth daily. 10/18/15   Historical Provider, MD  ondansetron (ZOFRAN ODT) 4 MG disintegrating tablet Take 1 tablet (4 mg total) by mouth every 8 (eight) hours as needed for nausea or vomiting. Patient not taking: Reported on 11/02/2015 10/31/15   Sofie Rower, MD  Oxycodone HCl 10 MG TABS Take 1 tablet by mouth every 6 (six) hours as needed (pain).  04/17/14   Historical Provider, MD  potassium chloride SA (K-DUR,KLOR-CON) 20 MEQ tablet Take 20 mEq by mouth daily. 10/07/15   Historical Provider, MD  traMADol (ULTRAM) 50 MG tablet Take 1 tablet (50 mg total) by mouth every 6 (six) hours as needed. Patient taking differently: Take 50 mg by mouth every 6 (six) hours as needed for moderate pain.  07/29/15   Derwood Kaplan, MD   Triage Vitals: BP 202/97 mmHg  Pulse 86  Temp(Src) 97.5 F (36.4 C) (Oral)  Resp 22  Ht  (1.803 m)  Wt 338 lb (153.316 kg)  BMI 47.16 kg/m2  SpO2 91%   Physical Exam  Constitutional: He is oriented to person, place, and time. He appears well-developed and well-nourished.  HENT:  Head: Normocephalic and atraumatic.  Mouth/Throat: Oropharynx is clear and moist.  Eyes: EOM are normal. Pupils are equal, round, and reactive to light.  Neck: Normal range of motion.  Cardiovascular: Normal rate, regular rhythm, normal heart sounds and intact distal pulses.   Pulmonary/Chest: Effort normal and breath sounds normal. No respiratory distress.  Abdominal: Soft. He exhibits no distension. There is no tenderness.  Hyperactive bowel sounds. Constipated.   Musculoskeletal: Normal range of motion.  Neurological: He is alert and oriented to person, place, and time. He has normal reflexes.  Skin: Skin is warm and dry.  Psychiatric: He has a normal mood and affect. Judgment normal.  Nursing note and vitals reviewed.   ED Course  Procedures (including critical care time)  DIAGNOSTIC STUDIES: Oxygen Saturation is 91% on RA, low by my interpretation.    COORDINATION OF CARE: 1:59 AM-Discussed treatment plan with pt at bedside and pt agreed to plan.     Labs Review Labs Reviewed  CBC WITH DIFFERENTIAL/PLATELET - Abnormal; Notable for the following:    WBC 10.6 (*)    Neutro Abs 8.9 (*)    All other components within normal limits  COMPREHENSIVE METABOLIC PANEL - Abnormal; Notable  for the following:    Potassium 3.3 (*)    Chloride 100 (*)    CO2 33 (*)    Glucose, Bld 328 (*)    Creatinine, Ser 1.37 (*)    Calcium 8.7 (*)    Albumin 2.9 (*)    ALT 14 (*)    All other components within normal limits  LIPASE, BLOOD - Abnormal; Notable for the following:    Lipase 105 (*)    All other components within normal limits  URINALYSIS, ROUTINE W REFLEX MICROSCOPIC (NOT AT [facility]) - Abnormal; Notable for the following:    Glucose, UA >1000 (*)    Hgb urine dipstick SMALL (*)    Protein, ur >300 (*)    All other components within normal limits  URINE MICROSCOPIC-ADD ON - Abnormal; Notable for the following:    Squamous Epithelial / LPF 0-5 (*)    All other components within normal limits    Imaging Review Dg Abd Acute W/chest  11/02/2015  CLINICAL DATA:  Acute onset of generalized abdominal pain and nausea. Initial encounter. EXAM: DG ABDOMEN ACUTE W/ 1V CHEST COMPARISON:  Abdominal and chest radiographs performed 10/30/2015 FINDINGS: The lungs are well-aerated. Bibasilar airspace opacities likely reflect atelectasis. Mild vascular congestion is seen. No pleural effusion or pneumothorax is identified. The cardiomediastinal silhouette is  borderline enlarged. The visualized bowel gas pattern is unremarkable. Scattered stool and air are seen within the colon; there is no evidence of small bowel dilatation to suggest obstruction. No free intra-abdominal air is identified on the provided upright view. No acute osseous abnormalities are seen; the sacroiliac joints are unremarkable in appearance. A plate and screws are noted along the right ischium and ilium. IMPRESSION: 1. Unremarkable bowel gas pattern; no free intra-abdominal air seen. Small amount of stool noted in the colon. 2. Bibasilar airspace opacities likely reflect atelectasis. Vascular congestion and borderline cardiomegaly noted. Electronically Signed   By: Roanna Raider M.D.   On: 11/02/2015 01:25   I have personally reviewed and evaluated these images and lab results as part of my medical decision-making.   EKG Interpretation None      MDM   Final diagnoses:  None   Filed Vitals:   11/03/15 0200 11/03/15 0301  BP: 188/98 189/89  Pulse: 91 83  Temp:    Resp:  18    EKG Interpretation  Date/Time:    Ventricular Rate:    PR Interval:    QRS Duration:   QT Interval:    QTC Calculation:   R Axis:     Text Interpretation:        Results for orders placed or performed during the hospital encounter of 11/02/15  CBC with Differential  Result Value Ref Range   WBC 10.6 (H) 4.0 - 10.5 K/uL   RBC 4.89 4.22 - 5.81 MIL/uL   Hemoglobin 13.3 13.0 - 17.0 g/dL   HCT 16.1 09.6 - 04.5 %   MCV 84.3 78.0 - 100.0 fL   MCH 27.2 26.0 - 34.0 pg   MCHC 32.3 30.0 - 36.0 g/dL   RDW 40.9 81.1 - 91.4 %   Platelets 258 150 - 400 K/uL   Neutrophils Relative % 84 %   Neutro Abs 8.9 (H) 1.7 - 7.7 K/uL   Lymphocytes Relative 9 %   Lymphs Abs 1.0 0.7 - 4.0 K/uL   Monocytes Relative 6 %   Monocytes Absolute 0.7 0.1 - 1.0 K/uL   Eosinophils Relative 1 %   Eosinophils Absolute  0.1 0.0 - 0.7 K/uL   Basophils Relative 0 %   Basophils Absolute 0.0 0.0 - 0.1 K/uL  Comprehensive  metabolic panel  Result Value Ref Range   Sodium 139 135 - 145 mmol/L   Potassium 3.3 (L) 3.5 - 5.1 mmol/L   Chloride 100 (L) 101 - 111 mmol/L   CO2 33 (H) 22 - 32 mmol/L   Glucose, Bld 328 (H) 65 - 99 mg/dL   BUN 10 6 - 20 mg/dL   Creatinine, Ser 0.981.37 (H) 0.61 - 1.24 mg/dL   Calcium 8.7 (L) 8.9 - 10.3 mg/dL   Total Protein 6.7 6.5 - 8.1 g/dL   Albumin 2.9 (L) 3.5 - 5.0 g/dL   AST 15 15 - 41 U/L   ALT 14 (L) 17 - 63 U/L   Alkaline Phosphatase 97 38 - 126 U/L   Total Bilirubin 0.4 0.3 - 1.2 mg/dL   GFR calc non Af Amer >60 >60 mL/min   GFR calc Af Amer >60 >60 mL/min   Anion gap 6 5 - 15  Lipase, blood  Result Value Ref Range   Lipase 105 (H) 11 - 51 U/L  Urinalysis, Routine w reflex microscopic (not [facility])  Result Value Ref Range   Color, Urine YELLOW YELLOW   APPearance CLEAR CLEAR   Specific Gravity, Urine 1.014 1.005 - 1.030   pH 7.0 5.0 - 8.0   Glucose, UA >1000 (A) NEGATIVE mg/dL   Hgb urine dipstick SMALL (A) NEGATIVE   Bilirubin Urine NEGATIVE NEGATIVE   Ketones, ur NEGATIVE NEGATIVE mg/dL   Protein, ur >119>300 (A) NEGATIVE mg/dL   Nitrite NEGATIVE NEGATIVE   Leukocytes, UA NEGATIVE NEGATIVE  Urine microscopic-add on  Result Value Ref Range   Squamous Epithelial / LPF 0-5 (A) NONE SEEN   WBC, UA NONE SEEN 0 - 5 WBC/hpf   RBC / HPF NONE SEEN 0 - 5 RBC/hpf   Bacteria, UA NONE SEEN NONE SEEN   Dg Abd Acute W/chest  11/02/2015  CLINICAL DATA:  Acute onset of generalized abdominal pain and nausea. Initial encounter. EXAM: DG ABDOMEN ACUTE W/ 1V CHEST COMPARISON:  Abdominal and chest radiographs performed 10/30/2015 FINDINGS: The lungs are well-aerated. Bibasilar airspace opacities likely reflect atelectasis. Mild vascular congestion is seen. No pleural effusion or pneumothorax is identified. The cardiomediastinal silhouette is borderline enlarged. The visualized bowel gas pattern is unremarkable. Scattered stool and air are seen within the colon; there is no evidence of  small bowel dilatation to suggest obstruction. No free intra-abdominal air is identified on the provided upright view. No acute osseous abnormalities are seen; the sacroiliac joints are unremarkable in appearance. A plate and screws are noted along the right ischium and ilium. IMPRESSION: 1. Unremarkable bowel gas pattern; no free intra-abdominal air seen. Small amount of stool noted in the colon. 2. Bibasilar airspace opacities likely reflect atelectasis. Vascular congestion and borderline cardiomegaly noted. Electronically Signed   By: Roanna RaiderJeffery  Chang M.D.   On: 11/02/2015 01:25   Dg Abd Acute W/chest  10/30/2015  CLINICAL DATA:  Acute onset of upper abdominal pain and vomiting. Diarrhea. Mid chest pain and shortness of breath. Initial encounter. EXAM: DG ABDOMEN ACUTE W/ 1V CHEST COMPARISON:  Chest and abdominal radiographs performed 07/26/2015 FINDINGS: There is mild elevation of the right hemidiaphragm. Vascular congestion is noted. Bibasilar airspace opacities raise concern for mild interstitial edema. There is no evidence of pleural effusion or pneumothorax. The cardiomediastinal silhouette is borderline enlarged. The visualized bowel gas pattern is  unremarkable. Scattered stool and air are seen within the colon; there is no evidence of small bowel dilatation to suggest obstruction. No free intra-abdominal air is identified on the provided upright view. No acute osseous abnormalities are seen; the sacroiliac joints are unremarkable in appearance. Postoperative change is noted along the right ilium and right ischium. IMPRESSION: 1. Mild elevation of the right hemidiaphragm. Vascular congestion and borderline cardiomegaly noted. Bibasilar airspace opacities raise concern for mild interstitial edema. 2. Unremarkable bowel gas pattern; no free intra-abdominal air seen. Small to moderate amount of stool noted in the colon. Electronically Signed   By: Roanna Raider M.D.   On: 10/30/2015 23:17    Medications   ketorolac (TORADOL) 30 MG/ML injection 30 mg (30 mg Intravenous Given 11/03/15 0258)  sodium chloride 0.9 % bolus 500 mL (500 mLs Intravenous New Bag/Given 11/03/15 0259)  haloperidol lactate (HALDOL) injection 5 mg (5 mg Intravenous Given 11/03/15 0258)    Exam and vitals are benign and reassuring.  Stable for discharge.  Follow up with your PMD  I personally performed the services described in this documentation, which was scribed in my presence. The recorded information has been reviewed and is accurate.     Cy Blamer, MD 11/03/15 [phone]

## 2015-12-05 ENCOUNTER — Encounter (HOSPITAL_COMMUNITY): Payer: Self-pay | Admitting: *Deleted

## 2015-12-05 DIAGNOSIS — I4581 Long QT syndrome: Secondary | ICD-10-CM | POA: Diagnosis not present

## 2015-12-05 DIAGNOSIS — Z794 Long term (current) use of insulin: Secondary | ICD-10-CM | POA: Diagnosis not present

## 2015-12-05 DIAGNOSIS — E1140 Type 2 diabetes mellitus with diabetic neuropathy, unspecified: Secondary | ICD-10-CM | POA: Diagnosis not present

## 2015-12-05 DIAGNOSIS — E1143 Type 2 diabetes mellitus with diabetic autonomic (poly)neuropathy: Secondary | ICD-10-CM | POA: Insufficient documentation

## 2015-12-05 DIAGNOSIS — E876 Hypokalemia: Secondary | ICD-10-CM | POA: Insufficient documentation

## 2015-12-05 DIAGNOSIS — Z79899 Other long term (current) drug therapy: Secondary | ICD-10-CM | POA: Diagnosis not present

## 2015-12-05 DIAGNOSIS — J45909 Unspecified asthma, uncomplicated: Secondary | ICD-10-CM | POA: Insufficient documentation

## 2015-12-05 DIAGNOSIS — Z7984 Long term (current) use of oral hypoglycemic drugs: Secondary | ICD-10-CM | POA: Diagnosis not present

## 2015-12-05 DIAGNOSIS — R1010 Upper abdominal pain, unspecified: Secondary | ICD-10-CM | POA: Diagnosis present

## 2015-12-05 LAB — COMPREHENSIVE METABOLIC PANEL
ALT: 16 U/L — AB (ref 17–63)
ANION GAP: 9 (ref 5–15)
AST: 18 U/L (ref 15–41)
Albumin: 2.7 g/dL — ABNORMAL LOW (ref 3.5–5.0)
Alkaline Phosphatase: 93 U/L (ref 38–126)
BUN: 14 mg/dL (ref 6–20)
CHLORIDE: 103 mmol/L (ref 101–111)
CO2: 27 mmol/L (ref 22–32)
CREATININE: 1.81 mg/dL — AB (ref 0.61–1.24)
Calcium: 9.1 mg/dL (ref 8.9–10.3)
GFR, EST AFRICAN AMERICAN: 50 mL/min — AB (ref >60)
GFR, EST NON AFRICAN AMERICAN: 43 mL/min — AB (ref >60)
Glucose, Bld: 159 mg/dL — ABNORMAL HIGH (ref 65–99)
POTASSIUM: 3.1 mmol/L — AB (ref 3.5–5.1)
SODIUM: 139 mmol/L (ref 135–145)
Total Bilirubin: 0.6 mg/dL (ref 0.3–1.2)
Total Protein: 6.7 g/dL (ref 6.5–8.1)

## 2015-12-05 LAB — LIPASE, BLOOD: LIPASE: 47 U/L (ref 11–51)

## 2015-12-05 LAB — CBC
HEMATOCRIT: 42.4 % (ref 39.0–52.0)
HEMOGLOBIN: 13.6 g/dL (ref 13.0–17.0)
MCH: 27.4 pg (ref 26.0–34.0)
MCHC: 32.1 g/dL (ref 30.0–36.0)
MCV: 85.3 fL (ref 78.0–100.0)
Platelets: 267 10*3/uL (ref 150–400)
RBC: 4.97 MIL/uL (ref 4.22–5.81)
RDW: 14.3 % (ref 11.5–15.5)
WBC: 10.9 10*3/uL — AB (ref 4.0–10.5)

## 2015-12-05 MED ORDER — ONDANSETRON 4 MG PO TBDP
ORAL_TABLET | ORAL | Status: AC
  Filled 2015-12-05: qty 1

## 2015-12-05 MED ORDER — ONDANSETRON 4 MG PO TBDP
4.0000 mg | ORAL_TABLET | Freq: Once | ORAL | Status: AC | PRN
  Administered 2015-12-05: 4 mg via ORAL

## 2015-12-05 NOTE — ED Notes (Signed)
Pt c/o abd pain, emesis and diarrhea x 4 days.

## 2015-12-06 ENCOUNTER — Emergency Department (HOSPITAL_COMMUNITY): Payer: Medicare Other

## 2015-12-06 ENCOUNTER — Emergency Department (HOSPITAL_COMMUNITY)
Admission: EM | Admit: 2015-12-06 | Discharge: 2015-12-06 | Disposition: A | Discharge status: Home / Self Care | Payer: Medicare Other | Attending: Emergency Medicine | Admitting: Emergency Medicine

## 2015-12-06 DIAGNOSIS — E876 Hypokalemia: Secondary | ICD-10-CM

## 2015-12-06 DIAGNOSIS — K3184 Gastroparesis: Secondary | ICD-10-CM

## 2015-12-06 DIAGNOSIS — E1143 Type 2 diabetes mellitus with diabetic autonomic (poly)neuropathy: Secondary | ICD-10-CM | POA: Diagnosis not present

## 2015-12-06 DIAGNOSIS — R9431 Abnormal electrocardiogram [ECG] [EKG]: Secondary | ICD-10-CM

## 2015-12-06 DIAGNOSIS — R197 Diarrhea, unspecified: Secondary | ICD-10-CM

## 2015-12-06 DIAGNOSIS — R112 Nausea with vomiting, unspecified: Secondary | ICD-10-CM

## 2015-12-06 LAB — URINALYSIS, ROUTINE W REFLEX MICROSCOPIC
BILIRUBIN URINE: NEGATIVE
Glucose, UA: 100 mg/dL — AB
KETONES UR: NEGATIVE mg/dL
Leukocytes, UA: NEGATIVE
NITRITE: NEGATIVE
Protein, ur: >300 mg/dL — AB
Specific Gravity, Urine: 1.016 (ref 1.005–1.030)
pH: 7 (ref 5.0–8.0)

## 2015-12-06 LAB — URINE MICROSCOPIC-ADD ON

## 2015-12-06 MED ORDER — POTASSIUM CHLORIDE CRYS ER 20 MEQ PO TBCR
40.0000 meq | EXTENDED_RELEASE_TABLET | ORAL | Status: AC
  Administered 2015-12-06: 40 meq via ORAL
  Filled 2015-12-06: qty 2

## 2015-12-06 MED ORDER — METOCLOPRAMIDE HCL 5 MG/ML IJ SOLN
10.0000 mg | Freq: Once | INTRAMUSCULAR | Status: AC
  Administered 2015-12-06: 10 mg via INTRAVENOUS
  Filled 2015-12-06: qty 2

## 2015-12-06 MED ORDER — HYDROMORPHONE HCL 1 MG/ML IJ SOLN
1.0000 mg | Freq: Once | INTRAMUSCULAR | Status: AC
  Administered 2015-12-06: 1 mg via INTRAVENOUS
  Filled 2015-12-06: qty 1

## 2015-12-06 MED ORDER — METOCLOPRAMIDE HCL 10 MG PO TABS: 10.0000 mg | ORAL_TABLET | Freq: Three times a day (TID) | ORAL | Status: DC | PRN

## 2015-12-06 MED ORDER — SODIUM CHLORIDE 0.9 % IV BOLUS (SEPSIS)
1000.0000 mL | Freq: Once | INTRAVENOUS | Status: AC
  Administered 2015-12-06: 1000 mL via INTRAVENOUS

## 2015-12-06 MED ORDER — POTASSIUM CHLORIDE CRYS ER 20 MEQ PO TBCR
40.0000 meq | EXTENDED_RELEASE_TABLET | Freq: Once | ORAL | Status: DC
Start: 2015-12-06 — End: 2015-12-06

## 2015-12-06 MED ORDER — POTASSIUM CHLORIDE CRYS ER 20 MEQ PO TBCR: 40.0000 meq | EXTENDED_RELEASE_TABLET | Freq: Every day | ORAL | Status: DC

## 2015-12-06 NOTE — ED Notes (Signed)
Patient left at this time with all belongings. 

## 2015-12-06 NOTE — ED Provider Notes (Signed)
CSN: [id number]     Arrival date & time 12/05/15  2143 History   First MD Initiated Contact with Patient 12/06/15 0113     Chief Complaint  Patient presents with  . Abdominal Pain     (Consider location/radiation/quality/duration/timing/severity/associated sxs/prior Treatment) The history is provided by the patient.     Patient presents with 4 days of upper abdominal pain, vomiting, diarrhea that feels like his hx gastroparesis.  Has been unable to fill his home nausea prescription due to need for doctor authorization paperwork.  Has associated chills, increased peripheral edema.  Companion in the room states that pt is at his baseline, no other problems currently.    Past Medical History  Diagnosis Date  . Sickle cell trait (HCC)   . Diabetes (HCC)   . Obstructive sleep apnea     will not wear CPAP  . Asthma   . GERD (gastroesophageal reflux disease)   . Gastroparesis   . Kidney disorder   . Neuropathy (HCC)   . Obesity    Past Surgical History  Procedure Laterality Date  . Hip fracture surgery Right 1999    "put a plate in"   Family History  Problem Relation Age of Onset  . Diabetes Father   . Neuropathy Father   . Hypertension Father   . Sickle cell anemia Mother   . Kidney disease Brother   . Kidney disease Sister   . Aneurysm Father   . Heart attack Mother    Social History  Substance Use Topics  . Smoking status: Never Smoker   . Smokeless tobacco: Never Used  . Alcohol Use: No    Review of Systems  All other systems reviewed and are negative.     Allergies  Angiotensin receptor blockers and Lisinopril  Home Medications   Prior to Admission medications   Medication Sig Start Date End Date Taking? Authorizing Provider  amLODipine (NORVASC) 5 MG tablet Take 1 tablet (5 mg total) by mouth daily. 06/06/15  Yes Alison Murray, MD  dicyclomine (BENTYL) 20 MG tablet Take 1 tablet (20 mg total) by mouth 2 (two) times daily. 05/29/15  Yes Benjamin Cartner,  PA-C  furosemide (LASIX) 40 MG tablet Take 40 mg by mouth 2 (two) times daily. 10/15/15  Yes Historical Provider, MD  hydrALAZINE (APRESOLINE) 50 MG tablet Take 1 tablet (50 mg total) by mouth 3 (three) times daily. 06/06/15  Yes Alison Murray, MD  insulin aspart (NOVOLOG) 100 UNIT/ML injection Inject 10-20 Units into the skin 3 (three) times daily with meals. per sliding scale CBG 70 - 120: 0 units CBG 121 - 150: 2 units CBG 151 - 200: 3 units CBG 201 - 250: 5 units CBG 251 - 300: 8 units CBG 301 - 350: 11 units CBG 351 - 400: 15 units 06/06/15  Yes Alison Murray, MD  insulin detemir (LEVEMIR) 100 UNIT/ML injection Inject 0.4 mLs (40 Units total) into the skin daily. Patient taking differently: Inject 80 Units into the skin every evening.  06/06/15  Yes Alison Murray, MD  LYRICA 100 MG capsule Take 100 mg by mouth 3 (three) times daily. 05/02/15  Yes Historical Provider, MD  metFORMIN (GLUCOPHAGE) 500 MG tablet Take 500 mg by mouth 3 (three) times daily. 09/22/15  Yes Historical Provider, MD  metoCLOPramide (REGLAN) 10 MG tablet Take 1 tablet (10 mg total) by mouth every 6 (six) hours as needed for nausea (nausea/headache). 11/02/15  Yes Gilda Crease, MD  metoprolol succinate (  TOPROL-XL) 50 MG 24 hr tablet Take 50 mg by mouth daily. 10/18/15  Yes Historical Provider, MD  ondansetron (ZOFRAN ODT) 4 MG disintegrating tablet Take 1 tablet (4 mg total) by mouth every 8 (eight) hours as needed for nausea or vomiting. 10/31/15  Yes Sofie RowerMike Stengel, MD  Oxycodone HCl 10 MG TABS Take 1 tablet by mouth every 6 (six) hours as needed (pain).  04/17/14  Yes Historical Provider, MD  potassium chloride SA (K-DUR,KLOR-CON) 20 MEQ tablet Take 20 mEq by mouth daily. 10/07/15  Yes Historical Provider, MD  traMADol (ULTRAM) 50 MG tablet Take 1 tablet (50 mg total) by mouth every 6 (six) hours as needed. Patient taking differently: Take 50 mg by mouth every 6 (six) hours as needed for moderate pain.  07/29/15  Yes  Ankit Nanavati, MD   BP 143/91 mmHg  Pulse 74  Temp(Src) 97.7 F (36.5 C) (Oral)  Resp 24  Ht 5\' 11"  (1.803 m)  Wt 143.79 kg  BMI 44.23 kg/m2  SpO2 97% Physical Exam  Constitutional: He appears well-developed and well-nourished. No distress.  Chronically ill appearing   HENT:  Head: Normocephalic and atraumatic.  Neck: Neck supple.  Cardiovascular: Normal rate and regular rhythm.   Pulmonary/Chest: Effort normal and breath sounds normal. No respiratory distress. He has no wheezes. He has no rales.  Abdominal: Soft. He exhibits no distension. There is tenderness (upper abdomen ). There is no rebound and no guarding.  Morbidly obese   Musculoskeletal: He exhibits edema (pitting edema bilateral lower extremities ).  Neurological: He is alert. He exhibits normal muscle tone.  Skin: He is not diaphoretic.  Nursing note and vitals reviewed.   ED Course  Procedures (including critical care time) Labs Review Labs Reviewed  COMPREHENSIVE METABOLIC PANEL - Abnormal; Notable for the following:    Potassium 3.1 (*)    Glucose, Bld 159 (*)    Creatinine, Ser 1.81 (*)    Albumin 2.7 (*)    ALT 16 (*)    GFR calc non Af Amer 43 (*)    GFR calc Af Amer 50 (*)    All other components within normal limits  CBC - Abnormal; Notable for the following:    WBC 10.9 (*)    All other components within normal limits  URINALYSIS, ROUTINE W REFLEX MICROSCOPIC (NOT AT [facility]) - Abnormal; Notable for the following:    Glucose, UA 100 (*)    Hgb urine dipstick SMALL (*)    Protein, ur >300 (*)    All other components within normal limits  URINE MICROSCOPIC-ADD ON - Abnormal; Notable for the following:    Squamous Epithelial / LPF 0-5 (*)    Bacteria, UA RARE (*)    Casts HYALINE CASTS (*)    All other components within normal limits  LIPASE, BLOOD    Imaging Review Dg Abd Acute W/chest  12/06/2015  CLINICAL DATA:  45 year old male with abdominal pain, nausea vomiting. EXAM: DG ABDOMEN ACUTE  W/ 1V CHEST COMPARISON:  All radiograph dated 11/02/2015 FINDINGS: Stable appearing bibasilar linear and platelike airspace densities which may represent atelectasis/ scarring. Infiltrate is not excluded. Clinical correlation is recommended. There is no pleural effusion or pneumothorax. Stable cardiac silhouette. There is no bowel dilatation or evidence of obstruction. Air and stool no noted within the colon. There is no free air. No radiopaque calculi. Right acetabular fixation hardware. No acute fractures. IMPRESSION: No evidence of bowel obstruction or free air. Bibasilar atelectasis/ scarring versus less likely infiltrate.  Electronically Signed   By: Elgie CollardArash  Radparvar M.D.   On: 12/06/2015 02:45   I have personally reviewed and evaluated these images and lab results as part of my medical decision-making.   EKG Interpretation   Date/Time:  Friday December 06 2015 03:40:48 EDT Ventricular Rate:  92 PR Interval:    QRS Duration: 101 QT Interval:  405 QTC Calculation: 502 R Axis:   -54 Text Interpretation:  Sinus rhythm LAD, consider left anterior fascicular  block Borderline low voltage, extremity leads Consider anterior infarct  Prolonged QT interval Confirmed by Bebe ShaggyWICKLINE  MD, Dorinda HillNALD ([id number]) on  12/06/2015 3:47:21 AM      MDM   Final diagnoses:  Gastroparesis  Nausea vomiting and diarrhea  Hypokalemia  Prolonged Q-T interval on ECG    Afebrile nontoxic morbidly obese pt with hx DM, gastroparesis p/w N/V/D and upper abdominal pain that feels like gastroparesis.  Has been out of his home medications - zofran.  Also ran out of his potassium.  Takes lasix daily.  Given single dose of medications in ED with improvement.  Tolerating PO.  Given potassium in ED.  Pt discussed with Dr Bebe ShaggyWickline who also saw the patient.  Pt is hypertensive but has been unable to keep down his home antihypertensives.   Pt advised to follow closely with PCP. D/C home with reglan, potassium. Discussed result, findings,  treatment, and follow up  with patient.  Pt given return precautions.  Pt verbalizes understanding and agrees with plan.        Rock FallsEmily Keante Urizar, PA-C 12/06/15 [phone]  Zadie Rhineonald Wickline, MD 12/06/15 [phone]

## 2015-12-06 NOTE — Discharge Instructions (Signed)
Read the information below.  Use the prescribed medication as directed.  Please discuss all new medications with your pharmacist.  You may return to the Emergency Department at any time for worsening condition or any new symptoms that concern you.  If you develop high fevers, worsening abdominal pain, uncontrolled vomiting, or are unable to tolerate fluids by mouth, return to the ER for a recheck.   ° ° °Gastroparesis °Gastroparesis, also called delayed gastric emptying, is a condition in which food takes longer than normal to empty from the stomach. The condition is usually long-lasting (chronic). °CAUSES °This condition may be caused by: °· An endocrine disorder, such as hypothyroidism or diabetes. Diabetes is the most common cause of this condition. °· A nervous system disease, such as Parkinson disease or multiple sclerosis. °· Cancer, infection, or surgery of the stomach or vagus nerve. °· A connective tissue disorder, such as scleroderma. °· Certain medicines. °In most cases, the cause is not known. °RISK FACTORS °This condition is more likely to develop in: °· People with certain disorders, including endocrine disorders, eating disorders, amyloidosis, and scleroderma. °· People with certain diseases, including Parkinson disease or multiple sclerosis. °· People with cancer or infection of the stomach or vagus nerve. °· People who have had surgery on the stomach or vagus nerve. °· People who take certain medicines. °· Women. °SYMPTOMS °Symptoms of this condition include: °· An early feeling of fullness when eating. °· Nausea. °· Weight loss. °· Vomiting. °· Heartburn. °· Abdominal bloating. °· Inconsistent blood glucose levels. °· Lack of appetite. °· Acid from the stomach coming up into the esophagus (gastroesophageal reflux). °· Spasms of the stomach. °Symptoms may come and go. °DIAGNOSIS °This condition is diagnosed with tests, such as: °· Tests that check how long it takes food to move through the stomach  and intestines. These tests include: °¨ Upper gastrointestinal (GI) series. In this test, X-rays of the intestines are taken after you drink a liquid. The liquid makes the intestines show up better on the X-rays. °¨ Gastric emptying scintigraphy. In this test, scans are taken after you eat food that contains a small amount of radioactive material. °¨ Wireless capsule GI monitoring system. This test involves swallowing a capsule that records information about movement through the stomach. °· Gastric manometry. This test measures electrical and muscular activity in the stomach. It is done with a thin tube that is passed down the throat and into the stomach. °· Endoscopy. This test checks for abnormalities in the lining of the stomach. It is done with a long, thin tube that is passed down the throat and into the stomach. °· An ultrasound. This test can help rule out gallbladder disease or pancreatitis as a cause of your symptoms. It uses sound waves to take pictures of the inside of your body. °TREATMENT °There is no cure for gastroparesis. This condition may be managed with: °· Treatment of the underlying condition causing the gastroparesis. °· Lifestyle changes, including exercise and dietary changes. Dietary changes can include: °¨ Changes in what and when you eat. °¨ Eating smaller meals more often. °¨ Eating low-fat foods. °¨ Eating low-fiber forms of high-fiber foods, such as cooked vegetables instead of raw vegetables. °¨ Having liquid foods in place of solid foods. Liquid foods are easier to digest. °· Medicines. These may be given to control nausea and vomiting and to stimulate stomach muscles. °· Getting food through a feeding tube. This may be done in severe cases. °· A gastric neurostimulator. This   is a device that is inserted into the body with surgery. It helps improve stomach emptying and control nausea and vomiting. °HOME CARE INSTRUCTIONS °· Follow your health care provider's instructions about  exercise and diet. °· Take medicines only as directed by your health care provider. °SEEK MEDICAL CARE IF: °· Your symptoms do not improve with treatment. °· You have new symptoms. °SEEK IMMEDIATE MEDICAL CARE IF: °· You have severe abdominal pain that does not improve with treatment. °· You have nausea that does not go away. °· You cannot keep fluids down. °  °This information is not intended to replace advice given to you by your health care provider. Make sure you discuss any questions you have with your health care provider. °  °Document Released: 05/25/2005 Document Revised: 10/09/2014 Document Reviewed: 05/21/2014 °Elsevier Interactive Patient Education ©2016 Else[employer] Inc[employer] ° °

## 2015-12-06 NOTE — ED Notes (Signed)
PO challenge passed, no vomiting since Reglan given

## 2015-12-09 ENCOUNTER — Encounter (HOSPITAL_COMMUNITY): Payer: Self-pay

## 2015-12-09 ENCOUNTER — Emergency Department (HOSPITAL_COMMUNITY)
Admission: EM | Admit: 2015-12-09 | Discharge: 2015-12-09 | Disposition: A | Discharge status: Home / Self Care | Payer: Medicare Other | Attending: Emergency Medicine | Admitting: Emergency Medicine

## 2015-12-09 DIAGNOSIS — J45909 Unspecified asthma, uncomplicated: Secondary | ICD-10-CM | POA: Insufficient documentation

## 2015-12-09 DIAGNOSIS — R1013 Epigastric pain: Secondary | ICD-10-CM | POA: Diagnosis present

## 2015-12-09 DIAGNOSIS — Z7984 Long term (current) use of oral hypoglycemic drugs: Secondary | ICD-10-CM | POA: Insufficient documentation

## 2015-12-09 DIAGNOSIS — K3184 Gastroparesis: Secondary | ICD-10-CM

## 2015-12-09 DIAGNOSIS — Z794 Long term (current) use of insulin: Secondary | ICD-10-CM | POA: Diagnosis not present

## 2015-12-09 DIAGNOSIS — E1140 Type 2 diabetes mellitus with diabetic neuropathy, unspecified: Secondary | ICD-10-CM | POA: Diagnosis not present

## 2015-12-09 DIAGNOSIS — Z6841 Body Mass Index (BMI) 40.0 and over, adult: Secondary | ICD-10-CM | POA: Insufficient documentation

## 2015-12-09 LAB — URINALYSIS, ROUTINE W REFLEX MICROSCOPIC
Bilirubin Urine: NEGATIVE
GLUCOSE, UA: NEGATIVE mg/dL
Ketones, ur: NEGATIVE mg/dL
LEUKOCYTES UA: NEGATIVE
NITRITE: NEGATIVE
Specific Gravity, Urine: 1.014 (ref 1.005–1.030)
pH: 5.5 (ref 5.0–8.0)

## 2015-12-09 LAB — LIPASE, BLOOD: LIPASE: 20 U/L (ref 11–51)

## 2015-12-09 LAB — CBC
HCT: 40.3 % (ref 39.0–52.0)
Hemoglobin: 13.1 g/dL (ref 13.0–17.0)
MCH: 27.2 pg (ref 26.0–34.0)
MCHC: 32.5 g/dL (ref 30.0–36.0)
MCV: 83.8 fL (ref 78.0–100.0)
PLATELETS: 270 10*3/uL (ref 150–400)
RBC: 4.81 MIL/uL (ref 4.22–5.81)
RDW: 14.2 % (ref 11.5–15.5)
WBC: 11.5 10*3/uL — AB (ref 4.0–10.5)

## 2015-12-09 LAB — COMPREHENSIVE METABOLIC PANEL
ALBUMIN: 2.7 g/dL — AB (ref 3.5–5.0)
ALT: 16 U/L — ABNORMAL LOW (ref 17–63)
AST: 19 U/L (ref 15–41)
Alkaline Phosphatase: 88 U/L (ref 38–126)
Anion gap: 10 (ref 5–15)
BILIRUBIN TOTAL: 0.5 mg/dL (ref 0.3–1.2)
BUN: 13 mg/dL (ref 6–20)
CHLORIDE: 100 mmol/L — AB (ref 101–111)
CO2: 27 mmol/L (ref 22–32)
CREATININE: 2.17 mg/dL — AB (ref 0.61–1.24)
Calcium: 8.7 mg/dL — ABNORMAL LOW (ref 8.9–10.3)
GFR calc Af Amer: 40 mL/min — ABNORMAL LOW (ref >60)
GFR calc non Af Amer: 35 mL/min — ABNORMAL LOW (ref >60)
Glucose, Bld: 130 mg/dL — ABNORMAL HIGH (ref 65–99)
POTASSIUM: 2.9 mmol/L — AB (ref 3.5–5.1)
SODIUM: 137 mmol/L (ref 135–145)
TOTAL PROTEIN: 6.6 g/dL (ref 6.5–8.1)

## 2015-12-09 LAB — URINE MICROSCOPIC-ADD ON

## 2015-12-09 MED ORDER — LORAZEPAM 2 MG/ML IJ SOLN
1.0000 mg | Freq: Once | INTRAMUSCULAR | Status: AC
  Administered 2015-12-09: 1 mg via INTRAVENOUS
  Filled 2015-12-09: qty 1

## 2015-12-09 MED ORDER — POTASSIUM CHLORIDE 10 MEQ/100ML IV SOLN
10.0000 meq | Freq: Once | INTRAVENOUS | Status: AC
  Administered 2015-12-09: 10 meq via INTRAVENOUS
  Filled 2015-12-09: qty 100

## 2015-12-09 MED ORDER — SODIUM CHLORIDE 0.9 % IV BOLUS (SEPSIS)
1000.0000 mL | Freq: Once | INTRAVENOUS | Status: AC
  Administered 2015-12-09: 1000 mL via INTRAVENOUS

## 2015-12-09 MED ORDER — METOCLOPRAMIDE HCL 5 MG/ML IJ SOLN: 10.0000 mg | Freq: Once | INTRAMUSCULAR | Status: DC

## 2015-12-09 MED ORDER — POTASSIUM CHLORIDE 20 MEQ/15ML (10%) PO SOLN
40.0000 meq | Freq: Two times a day (BID) | ORAL | Status: DC
  Filled 2015-12-09: qty 30

## 2015-12-09 MED ORDER — POTASSIUM CHLORIDE 20 MEQ/15ML (10%) PO SOLN
40.0000 meq | ORAL | Status: AC
  Administered 2015-12-09: 40 meq via ORAL

## 2015-12-09 MED ORDER — METOCLOPRAMIDE HCL 5 MG/ML IJ SOLN
10.0000 mg | Freq: Once | INTRAMUSCULAR | Status: AC
  Administered 2015-12-09: 10 mg via INTRAVENOUS
  Filled 2015-12-09: qty 2

## 2015-12-09 MED ORDER — MORPHINE SULFATE (PF) 4 MG/ML IV SOLN
4.0000 mg | Freq: Once | INTRAVENOUS | Status: DC
Start: 2015-12-09 — End: 2015-12-09

## 2015-12-09 MED ORDER — DIAZEPAM 1 MG/ML PO SOLN: 5.0000 mg | Freq: Three times a day (TID) | ORAL | Status: DC | PRN

## 2015-12-09 NOTE — Discharge Instructions (Signed)
Read the information below.  Use the prescribed medication as directed.  Please discuss all new medications with your pharmacist.  You may return to the Emergency Department at any time for worsening condition or any new symptoms that concern you.  If you develop high fevers, worsening abdominal pain, uncontrolled vomiting, or are unable to tolerate fluids by mouth, return to the ER for a recheck.     Gastroparesis Gastroparesis, also called delayed gastric emptying, is a condition in which food takes longer than normal to empty from the stomach. The condition is usually long-lasting (chronic). CAUSES This condition may be caused by:  An endocrine disorder, such as hypothyroidism or diabetes. Diabetes is the most common cause of this condition.  A nervous system disease, such as Parkinson disease or multiple sclerosis.  Cancer, infection, or surgery of the stomach or vagus nerve.  A connective tissue disorder, such as scleroderma.  Certain medicines. In most cases, the cause is not known. RISK FACTORS This condition is more likely to develop in:  People with certain disorders, including endocrine disorders, eating disorders, amyloidosis, and scleroderma.  People with certain diseases, including Parkinson disease or multiple sclerosis.  People with cancer or infection of the stomach or vagus nerve.  People who have had surgery on the stomach or vagus nerve.  People who take certain medicines.  Women. SYMPTOMS Symptoms of this condition include:  An early feeling of fullness when eating.  Nausea.  Weight loss.  Vomiting.  Heartburn.  Abdominal bloating.  Inconsistent blood glucose levels.  Lack of appetite.  Acid from the stomach coming up into the esophagus (gastroesophageal reflux).  Spasms of the stomach. Symptoms may come and go. DIAGNOSIS This condition is diagnosed with tests, such as:  Tests that check how long it takes food to move through the stomach  and intestines. These tests include:  Upper gastrointestinal (GI) series. In this test, X-rays of the intestines are taken after you drink a liquid. The liquid makes the intestines show up better on the X-rays.  Gastric emptying scintigraphy. In this test, scans are taken after you eat food that contains a small amount of radioactive material.  Wireless capsule GI monitoring system. This test involves swallowing a capsule that records information about movement through the stomach.  Gastric manometry. This test measures electrical and muscular activity in the stomach. It is done with a thin tube that is passed down the throat and into the stomach.  Endoscopy. This test checks for abnormalities in the lining of the stomach. It is done with a long, thin tube that is passed down the throat and into the stomach.  An ultrasound. This test can help rule out gallbladder disease or pancreatitis as a cause of your symptoms. It uses sound waves to take pictures of the inside of your body. TREATMENT There is no cure for gastroparesis. This condition may be managed with:  Treatment of the underlying condition causing the gastroparesis.  Lifestyle changes, including exercise and dietary changes. Dietary changes can include:  Changes in what and when you eat.  Eating smaller meals more often.  Eating low-fat foods.  Eating low-fiber forms of high-fiber foods, such as cooked vegetables instead of raw vegetables.  Having liquid foods in place of solid foods. Liquid foods are easier to digest.  Medicines. These may be given to control nausea and vomiting and to stimulate stomach muscles.  Getting food through a feeding tube. This may be done in severe cases.  A gastric neurostimulator. This  is a device that is inserted into the body with surgery. It helps improve stomach emptying and control nausea and vomiting. HOME CARE INSTRUCTIONS  Follow your health care provider's instructions about  exercise and diet.  Take medicines only as directed by your health care provider. SEEK MEDICAL CARE IF:  Your symptoms do not improve with treatment.  You have new symptoms. SEEK IMMEDIATE MEDICAL CARE IF:  You have severe abdominal pain that does not improve with treatment.  You have nausea that does not go away.  You cannot keep fluids down.   This information is not intended to replace advice given to you by your health care provider. Make sure you discuss any questions you have with your health care provider.   Document Released: 05/25/2005 Document Revised: 10/09/2014 Document Reviewed: 05/21/2014 Elsevier Interactive Patient Education [employer]2016 Elsevier Inc.

## 2015-12-09 NOTE — ED Notes (Signed)
Pt. 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

## 2015-12-09 NOTE — ED Notes (Cosign Needed)
Hx gastroparesis.  Here with n/v/d.   Hypokalemia, no EKG changes.    Goal: PO trial, and discharge  8:59 PM Pt felt better after PO challenge, able to tolerates PO and felt comfortable going home.  Encourage pt to take his potassium supplementation at home and to f/u with PCP for further care.    BP 159/87 mmHg  Pulse 86  Temp(Src) 97.8 F (36.6 C) (Oral)  Resp 21  Ht 5\' 11"  (1.803 m)  Wt 143.337 kg  BMI 44.09 kg/m2  SpO2 97%  Results for orders placed or performed during the hospital encounter of 12/09/15  Lipase, blood  Result Value Ref Range   Lipase 20 11 - 51 U/L  Comprehensive metabolic panel  Result Value Ref Range   Sodium 137 135 - 145 mmol/L   Potassium 2.9 (L) 3.5 - 5.1 mmol/L   Chloride 100 (L) 101 - 111 mmol/L   CO2 27 22 - 32 mmol/L   Glucose, Bld 130 (H) 65 - 99 mg/dL   BUN 13 6 - 20 mg/dL   Creatinine, Ser 1.612.17 (H) 0.61 - 1.24 mg/dL   Calcium 8.7 (L) 8.9 - 10.3 mg/dL   Total Protein 6.6 6.5 - 8.1 g/dL   Albumin 2.7 (L) 3.5 - 5.0 g/dL   AST 19 15 - 41 U/L   ALT 16 (L) 17 - 63 U/L   Alkaline Phosphatase 88 38 - 126 U/L   Total Bilirubin 0.5 0.3 - 1.2 mg/dL   GFR calc non Af Amer 35 (L) >60 mL/min   GFR calc Af Amer 40 (L) >60 mL/min   Anion gap 10 5 - 15  CBC  Result Value Ref Range   WBC 11.5 (H) 4.0 - 10.5 K/uL   RBC 4.81 4.22 - 5.81 MIL/uL   Hemoglobin 13.1 13.0 - 17.0 g/dL   HCT 09.640.3 04.539.0 - 40.952.0 %   MCV 83.8 78.0 - 100.0 fL   MCH 27.2 26.0 - 34.0 pg   MCHC 32.5 30.0 - 36.0 g/dL   RDW 81.114.2 91.411.5 - 78.215.5 %   Platelets 270 150 - 400 K/uL  Urinalysis, Routine w reflex microscopic  Result Value Ref Range   Color, Urine YELLOW YELLOW   APPearance CLEAR CLEAR   Specific Gravity, Urine 1.014 1.005 - 1.030   pH 5.5 5.0 - 8.0   Glucose, UA NEGATIVE NEGATIVE mg/dL   Hgb urine dipstick SMALL (A) NEGATIVE   Bilirubin Urine NEGATIVE NEGATIVE   Ketones, ur NEGATIVE NEGATIVE mg/dL   Protein, ur >956>300 (A) NEGATIVE mg/dL   Nitrite NEGATIVE NEGATIVE   Leukocytes, UA NEGATIVE NEGATIVE  Urine microscopic-add on  Result Value Ref Range   Squamous Epithelial / LPF 0-5 (A) NONE SEEN   WBC, UA 0-5 0 - 5 WBC/hpf   RBC / HPF 0-5 0 - 5 RBC/hpf   Bacteria, UA MANY (A) NONE SEEN   Casts HYALINE CASTS (A) NEGATIVE   Dg Abd Acute W/chest  12/06/2015  CLINICAL DATA:  45 year old male with abdominal pain, nausea vomiting. EXAM: DG ABDOMEN ACUTE W/ 1V CHEST COMPARISON:  All radiograph dated 11/02/2015 FINDINGS: Stable appearing bibasilar linear and platelike airspace densities which may represent atelectasis/ scarring. Infiltrate is not excluded. Clinical correlation is recommended. There is no pleural effusion or pneumothorax. Stable cardiac silhouette. There is no bowel dilatation or evidence of obstruction. Air and stool no noted within the colon. There is no free air. No radiopaque calculi. Right acetabular fixation hardware. No acute fractures. IMPRESSION: No evidence  of bowel obstruction or free air. Bibasilar atelectasis/ scarring versus less likely infiltrate. Electronically Signed   By: Elgie CollardArash  Radparvar M.D.   On: 12/06/2015 02:45      Fayrene HelperBowie Alysabeth Scalia, PA-C 12/09/15 2059

## 2015-12-09 NOTE — ED Provider Notes (Signed)
CSN: [id number]     Arrival date & time 12/09/15  1445 History   First MD Initiated Contact with Patient 12/09/15 1718     Chief Complaint  Patient presents with  . Abdominal Pain  . Nausea     (Consider location/radiation/quality/duration/timing/severity/associated sxs/prior Treatment) HPI   Pt with hx gastroparesis presents with uncontrolled symptoms c/w his previous gastroparesis symptoms.  States he is taking the reglan I prescribed for him on 12/06/15 but it is not helping.  Reports epigastric pain with radiation into his chest, N/V/D.  11 ED visits in 6 months, frequently for similar symptoms.    Past Medical History  Diagnosis Date  . Sickle cell trait (HCC)   . Diabetes (HCC)   . Obstructive sleep apnea     will not wear CPAP  . Asthma   . GERD (gastroesophageal reflux disease)   . Gastroparesis   . Kidney disorder   . Neuropathy (HCC)   . Obesity    Past Surgical History  Procedure Laterality Date  . Hip fracture surgery Right 1999    "put a plate in"   Family History  Problem Relation Age of Onset  . Diabetes Father   . Neuropathy Father   . Hypertension Father   . Sickle cell anemia Mother   . Kidney disease Brother   . Kidney disease Sister   . Aneurysm Father   . Heart attack Mother    Social History  Substance Use Topics  . Smoking status: Never Smoker   . Smokeless tobacco: Never Used  . Alcohol Use: No    Review of Systems  All other systems reviewed and are negative.     Allergies  Angiotensin receptor blockers and Lisinopril  Home Medications   Prior to Admission medications   Medication Sig Start Date End Date Taking? Authorizing Provider  amLODipine (NORVASC) 5 MG tablet Take 1 tablet (5 mg total) by mouth daily. 06/06/15   Alison MurrayAlma M Devine, MD  dicyclomine (BENTYL) 20 MG tablet Take 1 tablet (20 mg total) by mouth 2 (two) times daily. 05/29/15   Joycie PeekBenjamin Cartner, PA-C  furosemide (LASIX) 40 MG tablet Take 40 mg by mouth 2 (two) times  daily. 10/15/15   Historical Provider, MD  hydrALAZINE (APRESOLINE) 50 MG tablet Take 1 tablet (50 mg total) by mouth 3 (three) times daily. 06/06/15   Alison MurrayAlma M Devine, MD  insulin aspart (NOVOLOG) 100 UNIT/ML injection Inject 10-20 Units into the skin 3 (three) times daily with meals. per sliding scale CBG 70 - 120: 0 units CBG 121 - 150: 2 units CBG 151 - 200: 3 units CBG 201 - 250: 5 units CBG 251 - 300: 8 units CBG 301 - 350: 11 units CBG 351 - 400: 15 units 06/06/15   Alison MurrayAlma M Devine, MD  insulin detemir (LEVEMIR) 100 UNIT/ML injection Inject 0.4 mLs (40 Units total) into the skin daily. Patient taking differently: Inject 80 Units into the skin every evening.  06/06/15   Alison MurrayAlma M Devine, MD  LYRICA 100 MG capsule Take 100 mg by mouth 3 (three) times daily. 05/02/15   Historical Provider, MD  metFORMIN (GLUCOPHAGE) 500 MG tablet Take 500 mg by mouth 3 (three) times daily. 09/22/15   Historical Provider, MD  metoCLOPramide (REGLAN) 10 MG tablet Take 1 tablet (10 mg total) by mouth every 8 (eight) hours as needed for nausea or vomiting. 12/06/15   Trixie DredgeEmily Darlene Brozowski, PA-C  metoprolol succinate (TOPROL-XL) 50 MG 24 hr tablet Take 50 mg by  mouth daily. 10/18/15   Historical Provider, MD  Oxycodone HCl 10 MG TABS Take 1 tablet by mouth every 6 (six) hours as needed (pain).  04/17/14   Historical Provider, MD  potassium chloride SA (K-DUR,KLOR-CON) 20 MEQ tablet Take 2 tablets (40 mEq total) by mouth daily. 12/06/15   Trixie Dredge, PA-C  traMADol (ULTRAM) 50 MG tablet Take 1 tablet (50 mg total) by mouth every 6 (six) hours as needed. Patient taking differently: Take 50 mg by mouth every 6 (six) hours as needed for moderate pain.  07/29/15   Ankit Nanavati, MD   BP 140/97 mmHg  Pulse 70  Temp(Src) 97.8 F (36.6 C) (Oral)  Resp 19  Ht  (1.803 m)  Wt 143.337 kg  BMI 44.09 kg/m2  SpO2 97% Physical Exam  Constitutional: He appears well-developed and well-nourished. No distress.  Grunting, uncomfortable  appearing   HENT:  Head: Normocephalic and atraumatic.  Neck: Neck supple.  Cardiovascular: Normal rate and regular rhythm.   Pulmonary/Chest: Effort normal and breath sounds normal. No respiratory distress. He has no wheezes. He has no rales.  Abdominal: Soft. He exhibits no distension. There is tenderness (epigastric ). There is no rebound and no guarding.  Morbidly obese   Neurological: He is alert. He exhibits normal muscle tone.  Skin: He is not diaphoretic.  Nursing note and vitals reviewed.   ED Course  Procedures (including critical care time) Labs Review Labs Reviewed  COMPREHENSIVE METABOLIC PANEL - Abnormal; Notable for the following:    Potassium 2.9 (*)    Chloride 100 (*)    Glucose, Bld 130 (*)    Creatinine, Ser 2.17 (*)    Calcium 8.7 (*)    Albumin 2.7 (*)    ALT 16 (*)    GFR calc non Af Amer 35 (*)    GFR calc Af Amer 40 (*)    All other components within normal limits  CBC - Abnormal; Notable for the following:    WBC 11.5 (*)    All other components within normal limits  URINALYSIS, ROUTINE W REFLEX MICROSCOPIC (NOT AT [facility]) - Abnormal; Notable for the following:    Hgb urine dipstick SMALL (*)    Protein, ur >300 (*)    All other components within normal limits  URINE MICROSCOPIC-ADD ON - Abnormal; Notable for the following:    Squamous Epithelial / LPF 0-5 (*)    Bacteria, UA MANY (*)    Casts HYALINE CASTS (*)    All other components within normal limits  LIPASE, BLOOD    Imaging Review No results found. I have personally reviewed and evaluated these images and lab results as part of my medical decision-making.   EKG Interpretation None      MDM   Final diagnoses:  Gastroparesis   Afebrile, nontoxic patient with hx gastroparesis who multiple visits for same.  Feeling much better after single dose of ativan and IVF.  Wife arrived and we discussed follow up and plan.  She requests liquid PO medications for home as pills seem to "get  stuck" and not digest.  I have recommended follow up with GI- pt previously was seen at [facility] but reports getting dismissed from practice but told he could come back if he had new referral.  Pt has hypokalemia - given IV and PO (liquid) potassium.  Tolerating PO until potassium, which upset his stomach again.  IV ativan given.  Anticipate  D/C home with liquid medication and GI follow  up.  Signed out to Fayrene HelperBowie Tran, PA-C, at change of shift anticipating d/c home after PO trial.    Trixie Dredgemily Lavell Ridings, PA-C 12/09/15 2017  Loren Raceravid Yelverton, MD 12/10/15 0010

## 2015-12-11 ENCOUNTER — Emergency Department (HOSPITAL_COMMUNITY): Payer: Medicare Other

## 2015-12-11 ENCOUNTER — Encounter (HOSPITAL_COMMUNITY): Payer: Self-pay | Admitting: Radiology

## 2015-12-11 ENCOUNTER — Observation Stay (HOSPITAL_COMMUNITY)
Admission: EM | Admit: 2015-12-11 | Discharge: 2015-12-13 | Disposition: A | Discharge status: Home Health | Payer: Medicare Other | Attending: Internal Medicine | Admitting: Internal Medicine

## 2015-12-11 DIAGNOSIS — IMO0002 Reserved for concepts with insufficient information to code with codable children: Secondary | ICD-10-CM

## 2015-12-11 DIAGNOSIS — G4733 Obstructive sleep apnea (adult) (pediatric): Secondary | ICD-10-CM | POA: Diagnosis not present

## 2015-12-11 DIAGNOSIS — E1143 Type 2 diabetes mellitus with diabetic autonomic (poly)neuropathy: Principal | ICD-10-CM | POA: Insufficient documentation

## 2015-12-11 DIAGNOSIS — D573 Sickle-cell trait: Secondary | ICD-10-CM | POA: Insufficient documentation

## 2015-12-11 DIAGNOSIS — E1165 Type 2 diabetes mellitus with hyperglycemia: Secondary | ICD-10-CM | POA: Diagnosis not present

## 2015-12-11 DIAGNOSIS — I130 Hypertensive heart and chronic kidney disease with heart failure and stage 1 through stage 4 chronic kidney disease, or unspecified chronic kidney disease: Secondary | ICD-10-CM | POA: Diagnosis not present

## 2015-12-11 DIAGNOSIS — E669 Obesity, unspecified: Secondary | ICD-10-CM | POA: Insufficient documentation

## 2015-12-11 DIAGNOSIS — N183 Chronic kidney disease, stage 3 (moderate): Secondary | ICD-10-CM | POA: Diagnosis not present

## 2015-12-11 DIAGNOSIS — Z6841 Body Mass Index (BMI) 40.0 and over, adult: Secondary | ICD-10-CM | POA: Insufficient documentation

## 2015-12-11 DIAGNOSIS — E1122 Type 2 diabetes mellitus with diabetic chronic kidney disease: Secondary | ICD-10-CM | POA: Diagnosis not present

## 2015-12-11 DIAGNOSIS — K3184 Gastroparesis: Secondary | ICD-10-CM | POA: Diagnosis not present

## 2015-12-11 DIAGNOSIS — E119 Type 2 diabetes mellitus without complications: Secondary | ICD-10-CM

## 2015-12-11 DIAGNOSIS — E1140 Type 2 diabetes mellitus with diabetic neuropathy, unspecified: Secondary | ICD-10-CM

## 2015-12-11 DIAGNOSIS — K219 Gastro-esophageal reflux disease without esophagitis: Secondary | ICD-10-CM | POA: Insufficient documentation

## 2015-12-11 DIAGNOSIS — Z9114 Patient's other noncompliance with medication regimen: Secondary | ICD-10-CM | POA: Diagnosis not present

## 2015-12-11 DIAGNOSIS — R1110 Vomiting, unspecified: Secondary | ICD-10-CM | POA: Diagnosis present

## 2015-12-11 DIAGNOSIS — E876 Hypokalemia: Secondary | ICD-10-CM | POA: Insufficient documentation

## 2015-12-11 DIAGNOSIS — Z794 Long term (current) use of insulin: Secondary | ICD-10-CM | POA: Diagnosis not present

## 2015-12-11 DIAGNOSIS — N179 Acute kidney failure, unspecified: Secondary | ICD-10-CM

## 2015-12-11 DIAGNOSIS — D72829 Elevated white blood cell count, unspecified: Secondary | ICD-10-CM

## 2015-12-11 DIAGNOSIS — R1310 Dysphagia, unspecified: Secondary | ICD-10-CM | POA: Diagnosis not present

## 2015-12-11 DIAGNOSIS — Z9981 Dependence on supplemental oxygen: Secondary | ICD-10-CM | POA: Insufficient documentation

## 2015-12-11 DIAGNOSIS — I5032 Chronic diastolic (congestive) heart failure: Secondary | ICD-10-CM | POA: Insufficient documentation

## 2015-12-11 DIAGNOSIS — E860 Dehydration: Secondary | ICD-10-CM | POA: Diagnosis not present

## 2015-12-11 DIAGNOSIS — R7989 Other specified abnormal findings of blood chemistry: Secondary | ICD-10-CM

## 2015-12-11 DIAGNOSIS — I10 Essential (primary) hypertension: Secondary | ICD-10-CM

## 2015-12-11 DIAGNOSIS — Z9989 Dependence on other enabling machines and devices: Secondary | ICD-10-CM

## 2015-12-11 DIAGNOSIS — R112 Nausea with vomiting, unspecified: Secondary | ICD-10-CM | POA: Diagnosis present

## 2015-12-11 DIAGNOSIS — R778 Other specified abnormalities of plasma proteins: Secondary | ICD-10-CM

## 2015-12-11 HISTORY — DX: Disorder of kidney and ureter, unspecified: N28.9

## 2015-12-11 LAB — I-STAT CG4 LACTIC ACID, ED: Lactic Acid, Venous: 2.41 mmol/L (ref 0.5–1.9)

## 2015-12-11 LAB — URINE MICROSCOPIC-ADD ON

## 2015-12-11 LAB — I-STAT VENOUS BLOOD GAS, ED
Acid-Base Excess: 10 mmol/L — ABNORMAL HIGH (ref 0.0–2.0)
Bicarbonate: 31.7 mEq/L — ABNORMAL HIGH (ref 20.0–24.0)
O2 Saturation: 83 %
TCO2: 33 mmol/L (ref 0–100)
pCO2, Ven: 33.9 mmHg — ABNORMAL LOW (ref 45.0–50.0)
pH, Ven: 7.58 — ABNORMAL HIGH (ref 7.250–7.300)
pO2, Ven: 40 mmHg (ref 31.0–45.0)

## 2015-12-11 LAB — CBC WITH DIFFERENTIAL/PLATELET
Basophils Absolute: 0 10*3/uL (ref 0.0–0.1)
Basophils Relative: 0 %
Eosinophils Absolute: 0 10*3/uL (ref 0.0–0.7)
Eosinophils Relative: 0 %
HCT: 41.8 % (ref 39.0–52.0)
Hemoglobin: 13.6 g/dL (ref 13.0–17.0)
Lymphocytes Relative: 13 %
Lymphs Abs: 1.2 10*3/uL (ref 0.7–4.0)
MCH: 27 pg (ref 26.0–34.0)
MCHC: 32.5 g/dL (ref 30.0–36.0)
MCV: 83.1 fL (ref 78.0–100.0)
Monocytes Absolute: 0.7 10*3/uL (ref 0.1–1.0)
Monocytes Relative: 8 %
Neutro Abs: 7.2 10*3/uL (ref 1.7–7.7)
Neutrophils Relative %: 79 %
Platelets: 287 10*3/uL (ref 150–400)
RBC: 5.03 MIL/uL (ref 4.22–5.81)
RDW: 14.2 % (ref 11.5–15.5)
WBC: 9.1 10*3/uL (ref 4.0–10.5)

## 2015-12-11 LAB — COMPREHENSIVE METABOLIC PANEL
ALT: 14 U/L — ABNORMAL LOW (ref 17–63)
AST: 16 U/L (ref 15–41)
Albumin: 2.6 g/dL — ABNORMAL LOW (ref 3.5–5.0)
Alkaline Phosphatase: 90 U/L (ref 38–126)
Anion gap: 9 (ref 5–15)
BUN: 8 mg/dL (ref 6–20)
CO2: 28 mmol/L (ref 22–32)
Calcium: 8.8 mg/dL — ABNORMAL LOW (ref 8.9–10.3)
Chloride: 100 mmol/L — ABNORMAL LOW (ref 101–111)
Creatinine, Ser: 1.63 mg/dL — ABNORMAL HIGH (ref 0.61–1.24)
GFR calc Af Amer: 57 mL/min — ABNORMAL LOW (ref >60)
GFR calc non Af Amer: 49 mL/min — ABNORMAL LOW (ref >60)
Glucose, Bld: 298 mg/dL — ABNORMAL HIGH (ref 65–99)
Potassium: 2.8 mmol/L — ABNORMAL LOW (ref 3.5–5.1)
Sodium: 137 mmol/L (ref 135–145)
Total Bilirubin: 0.6 mg/dL (ref 0.3–1.2)
Total Protein: 6.6 g/dL (ref 6.5–8.1)

## 2015-12-11 LAB — URINALYSIS, ROUTINE W REFLEX MICROSCOPIC
Bilirubin Urine: NEGATIVE
Glucose, UA: 250 mg/dL — AB
Ketones, ur: NEGATIVE mg/dL
Leukocytes, UA: NEGATIVE
Nitrite: NEGATIVE
Protein, ur: >300 mg/dL — AB
Specific Gravity, Urine: 1.011 (ref 1.005–1.030)
pH: 7 (ref 5.0–8.0)

## 2015-12-11 LAB — CBG MONITORING, ED: GLUCOSE-CAPILLARY: 304 mg/dL — AB (ref 65–99)

## 2015-12-11 LAB — GLUCOSE, CAPILLARY: Glucose-Capillary: 208 mg/dL — ABNORMAL HIGH (ref 65–99)

## 2015-12-11 LAB — LIPASE, BLOOD: Lipase: 28 U/L (ref 11–51)

## 2015-12-11 MED ORDER — DIAZEPAM 1 MG/ML PO SOLN: 5.0000 mg | Freq: Three times a day (TID) | ORAL | Status: DC | PRN

## 2015-12-11 MED ORDER — SODIUM CHLORIDE 0.9 % IV BOLUS (SEPSIS)
1000.0000 mL | Freq: Once | INTRAVENOUS | Status: AC
  Administered 2015-12-11: 1000 mL via INTRAVENOUS

## 2015-12-11 MED ORDER — METOCLOPRAMIDE HCL 10 MG PO TABS
10.0000 mg | ORAL_TABLET | Freq: Three times a day (TID) | ORAL | Status: DC | PRN
  Filled 2015-12-11: qty 1

## 2015-12-11 MED ORDER — DICYCLOMINE HCL 20 MG PO TABS
20.0000 mg | ORAL_TABLET | Freq: Two times a day (BID) | ORAL | Status: DC
  Administered 2015-12-11 – 2015-12-13 (×4): 20 mg via ORAL
  Filled 2015-12-11 (×4): qty 1

## 2015-12-11 MED ORDER — POTASSIUM CHLORIDE IN NACL 40-0.9 MEQ/L-% IV SOLN
INTRAVENOUS | Status: DC
  Filled 2015-12-11 (×2): qty 1000

## 2015-12-11 MED ORDER — IOPAMIDOL (ISOVUE-300) INJECTION 61%
INTRAVENOUS | Status: AC
  Administered 2015-12-11: 100 mL
  Filled 2015-12-11: qty 100

## 2015-12-11 MED ORDER — TRAMADOL HCL 50 MG PO TABS: 50.0000 mg | ORAL_TABLET | Freq: Four times a day (QID) | ORAL | Status: DC | PRN

## 2015-12-11 MED ORDER — OXYCODONE HCL 10 MG PO TABS: 10.0000 mg | ORAL_TABLET | Freq: Four times a day (QID) | ORAL | Status: DC | PRN

## 2015-12-11 MED ORDER — POTASSIUM CHLORIDE CRYS ER 20 MEQ PO TBCR
40.0000 meq | EXTENDED_RELEASE_TABLET | Freq: Every day | ORAL | Status: DC
  Administered 2015-12-12: 40 meq via ORAL
  Filled 2015-12-11: qty 2

## 2015-12-11 MED ORDER — INSULIN ASPART 100 UNIT/ML ~~LOC~~ SOLN
0.0000 [IU] | Freq: Every day | SUBCUTANEOUS | Status: DC
  Administered 2015-12-11: 2 [IU] via SUBCUTANEOUS

## 2015-12-11 MED ORDER — ONDANSETRON HCL 4 MG/2ML IJ SOLN: 4.0000 mg | Freq: Four times a day (QID) | INTRAMUSCULAR | Status: DC | PRN

## 2015-12-11 MED ORDER — INSULIN DETEMIR 100 UNIT/ML ~~LOC~~ SOLN
40.0000 [IU] | Freq: Every day | SUBCUTANEOUS | Status: DC
  Administered 2015-12-11: 40 [IU] via SUBCUTANEOUS
  Filled 2015-12-11 (×2): qty 0.4

## 2015-12-11 MED ORDER — POTASSIUM CHLORIDE 10 MEQ/100ML IV SOLN
10.0000 meq | INTRAVENOUS | Status: AC
  Administered 2015-12-11 (×3): 10 meq via INTRAVENOUS
  Filled 2015-12-11 (×3): qty 100

## 2015-12-11 MED ORDER — PROMETHAZINE HCL 25 MG/ML IJ SOLN
25.0000 mg | Freq: Once | INTRAMUSCULAR | Status: AC
  Administered 2015-12-11: 25 mg via INTRAVENOUS
  Filled 2015-12-11: qty 1

## 2015-12-11 MED ORDER — ONDANSETRON HCL 4 MG PO TABS
4.0000 mg | ORAL_TABLET | Freq: Four times a day (QID) | ORAL | Status: DC | PRN
Start: 2015-12-11 — End: 2015-12-13
  Administered 2015-12-12: 4 mg via ORAL
  Filled 2015-12-11: qty 1

## 2015-12-11 MED ORDER — POTASSIUM CHLORIDE 10 MEQ/100ML IV SOLN
10.0000 meq | INTRAVENOUS | Status: AC
  Administered 2015-12-11 – 2015-12-12 (×3): 10 meq via INTRAVENOUS
  Filled 2015-12-11 (×3): qty 100

## 2015-12-11 MED ORDER — HYDRALAZINE HCL 50 MG PO TABS
50.0000 mg | ORAL_TABLET | Freq: Three times a day (TID) | ORAL | Status: DC
  Administered 2015-12-11 – 2015-12-13 (×5): 50 mg via ORAL
  Filled 2015-12-11 (×5): qty 1

## 2015-12-11 MED ORDER — IPRATROPIUM BROMIDE 0.02 % IN SOLN: 0.5000 mg | RESPIRATORY_TRACT | Status: DC | PRN

## 2015-12-11 MED ORDER — AMLODIPINE BESYLATE 5 MG PO TABS
5.0000 mg | ORAL_TABLET | Freq: Every day | ORAL | Status: DC
  Administered 2015-12-11 – 2015-12-13 (×3): 5 mg via ORAL
  Filled 2015-12-11 (×4): qty 1

## 2015-12-11 MED ORDER — PREGABALIN 100 MG PO CAPS
100.0000 mg | ORAL_CAPSULE | Freq: Three times a day (TID) | ORAL | Status: DC
  Administered 2015-12-11 – 2015-12-13 (×5): 100 mg via ORAL
  Filled 2015-12-11 (×5): qty 1

## 2015-12-11 MED ORDER — ENOXAPARIN SODIUM 40 MG/0.4ML ~~LOC~~ SOLN
40.0000 mg | SUBCUTANEOUS | Status: DC
  Administered 2015-12-11 – 2015-12-12 (×2): 40 mg via SUBCUTANEOUS
  Filled 2015-12-11 (×2): qty 0.4

## 2015-12-11 MED ORDER — MAGNESIUM SULFATE IN D5W 1-5 GM/100ML-% IV SOLN
1.0000 g | Freq: Once | INTRAVENOUS | Status: AC
  Administered 2015-12-11: 1 g via INTRAVENOUS
  Filled 2015-12-11: qty 100

## 2015-12-11 MED ORDER — INSULIN ASPART 100 UNIT/ML ~~LOC~~ SOLN: 0.0000 [IU] | Freq: Three times a day (TID) | SUBCUTANEOUS | Status: DC

## 2015-12-11 MED ORDER — MORPHINE SULFATE (PF) 4 MG/ML IV SOLN
4.0000 mg | Freq: Once | INTRAVENOUS | Status: AC
  Administered 2015-12-11: 4 mg via INTRAVENOUS
  Filled 2015-12-11: qty 1

## 2015-12-11 MED ORDER — OXYCODONE HCL 5 MG PO TABS: 10.0000 mg | ORAL_TABLET | Freq: Four times a day (QID) | ORAL | Status: DC | PRN

## 2015-12-11 MED ORDER — ALBUTEROL SULFATE (2.5 MG/3ML) 0.083% IN NEBU
2.5000 mg | INHALATION_SOLUTION | RESPIRATORY_TRACT | Status: DC | PRN
Start: 2015-12-11 — End: 2015-12-13

## 2015-12-11 MED ORDER — SODIUM CHLORIDE 0.9 % IV SOLN
INTRAVENOUS | Status: DC
  Administered 2015-12-11 – 2015-12-12 (×2): via INTRAVENOUS
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
## 2015-12-11 NOTE — H&P (Addendum)
History and Physical    BUEFORD ARP WUJ:[id number] DOB: Jul 25, 1970 DOA: 12/11/2015  Referring MD/NP/PA: Dr. Otila Kluver PCP: Lindaann Pascal, PA-C  Patient coming from: Home  Chief Complaint: stomach pain  HPI: Kenneth Fox is a 45 y.o. male with medical history significant of  IDDM with complications neuropathy and gastroparesis, HTN, oxygen dependent, CKD stage III, OSA, and GERD; presents with complaints of stomach pain, nausea, and vomiting. Symptoms have been intermittent over the last 2 weeks. Pain is generalized across his whole abdomen. He states that he's had difficulty in taking any of his oral medications as it feels like they are getting stuck in the center of his chest. He reports this burning queasy sensation that is across his whole abdomen. Patient's wife is present at bedside and provides additional history stating that over the last 4 days he's not been able to keep much of his food or liquids down. He reports trying to take his medications of Reglan without much relief of symptoms. Reports that he has not been taking his insulin because he's not been able to eat. When asked about his blood sugars he states that they're up and down. In listening to the patient's responses he does not routinely check blood sugars and give insulin as advised. Seen in the ED 2 days ago with similar complaints, but was able to tolerate oral medications and was able to be discharged home.    ED Course: Upon admission into the emergency department patient was found to be afebrile with all other vital signs within normal limits. Lab work revealed CBC within normal limits, potassium was 2.8, chloride 100, BUN 8, creatinine 1.63, glucose 298. Urinalysis negative, and CT of the abdomen shows a well circumcised lytic lesion that appears benign, but no other acute abnormality to explain patient's symptoms.  Review of Systems: As per HPI otherwise 10 point review of systems negative.   Past Medical History    Diagnosis Date  . Sickle cell trait (HCC)   . Diabetes (HCC)   . Obstructive sleep apnea     will not wear CPAP  . Asthma   . GERD (gastroesophageal reflux disease)   . Gastroparesis   . Kidney disorder   . Neuropathy (HCC)   . Obesity   . Hypertension   . Renal insufficiency     Past Surgical History  Procedure Laterality Date  . Hip fracture surgery Right 1999    "put a plate in"     reports that he has never smoked. He has never used smokeless tobacco. He reports that he does not drink alcohol or use illicit drugs.  Allergies  Allergen Reactions  . Angiotensin Receptor Blockers Other (See Comments)    Acute renal failure in patient w R heart failure  . Lisinopril Other (See Comments)    Patient developed AKI after being on lisinopril for a week.    Family History  Problem Relation Age of Onset  . Diabetes Father   . Neuropathy Father   . Hypertension Father   . Sickle cell anemia Mother   . Kidney disease Brother   . Kidney disease Sister   . Aneurysm Father   . Heart attack Mother     Prior to Admission medications   Medication Sig Start Date End Date Taking? Authorizing Provider  amLODipine (NORVASC) 5 MG tablet Take 1 tablet (5 mg total) by mouth daily. 06/06/15  Yes Alison Murray, MD  diazepam (VALIUM) 1 MG/ML solution Take 5 mLs (5 mg  total) by mouth every 8 (eight) hours as needed (nausea/vomiting). 12/09/15  Yes Trixie DredgeEmily West, PA-C  dicyclomine (BENTYL) 20 MG tablet Take 1 tablet (20 mg total) by mouth 2 (two) times daily. 05/29/15  Yes Benjamin Cartner, PA-C  furosemide (LASIX) 40 MG tablet Take 40 mg by mouth 2 (two) times daily. 10/15/15  Yes Historical Provider, MD  hydrALAZINE (APRESOLINE) 50 MG tablet Take 1 tablet (50 mg total) by mouth 3 (three) times daily. 06/06/15  Yes Alison MurrayAlma M Devine, MD  insulin aspart (NOVOLOG) 100 UNIT/ML injection Inject 10-20 Units into the skin 3 (three) times daily with meals. per sliding scale CBG 70 - 120: 0 units CBG 121 -  150: 2 units CBG 151 - 200: 3 units CBG 201 - 250: 5 units CBG 251 - 300: 8 units CBG 301 - 350: 11 units CBG 351 - 400: 15 units 06/06/15  Yes Alison MurrayAlma M Devine, MD  insulin detemir (LEVEMIR) 100 UNIT/ML injection Inject 0.4 mLs (40 Units total) into the skin daily. Patient taking differently: Inject 80 Units into the skin every evening.  06/06/15  Yes Alison MurrayAlma M Devine, MD  LYRICA 100 MG capsule Take 100 mg by mouth 3 (three) times daily. 05/02/15  Yes Historical Provider, MD  metFORMIN (GLUCOPHAGE) 500 MG tablet Take 500 mg by mouth 3 (three) times daily. 09/22/15  Yes Historical Provider, MD  metoCLOPramide (REGLAN) 10 MG tablet Take 1 tablet (10 mg total) by mouth every 8 (eight) hours as needed for nausea or vomiting. 12/06/15  Yes Trixie DredgeEmily West, PA-C  metoprolol succinate (TOPROL-XL) 50 MG 24 hr tablet Take 50 mg by mouth daily. 10/18/15  Yes Historical Provider, MD  Oxycodone HCl 10 MG TABS Take 1 tablet by mouth every 6 (six) hours as needed (pain).  04/17/14  Yes Historical Provider, MD  potassium chloride SA (K-DUR,KLOR-CON) 20 MEQ tablet Take 2 tablets (40 mEq total) by mouth daily. 12/06/15  Yes Trixie DredgeEmily West, PA-C  traMADol (ULTRAM) 50 MG tablet Take 1 tablet (50 mg total) by mouth every 6 (six) hours as needed. Patient taking differently: Take 50 mg by mouth every 6 (six) hours as needed for moderate pain.  07/29/15  Yes Derwood KaplanAnkit Nanavati, MD    Physical Exam:   Constitutional: Obese male who appears chronically ill and  lethargic, but arousable to verbal stimuli Filed Vitals:   12/11/15 1337  BP: 123/73  Pulse: 74  Temp: 97.9 F (36.6 C)  TempSrc: Oral  Resp: 18  SpO2: 94%   Eyes: PERRL, lids and conjunctivae normal ENMT: Mucous membranes are moist. Posterior pharynx clear of any exudate or lesions.Normal dentition.  Neck: normal, supple, no masses, no thyromegaly Respiratory: clear to auscultation bilaterally, no wheezing, no crackles. Normal respiratory effort. No accessory muscle use.   Cardiovascular: Regular rate and rhythm, no murmurs / rubs / gallops. No extremity edema. 2+ pedal pulses. No carotid bruits.  Abdomen: no tenderness, no masses palpated. No hepatosplenomegaly. Bowel sounds positive.  Musculoskeletal: no clubbing / cyanosis. No joint deformity upper and lower extremities. Good ROM, no contractures. Normal muscle tone.  Skin: no rashes, lesions, ulcers. No induration Neurologic: CN 2-12 grossly intact. Sensation intact, DTR normal. Strength 5/5 in all 4.  Psychiatric: poor judgment and insight. Alert and oriented x 3. Normal mood.     Labs on Admission: I have personally reviewed following labs and imaging studies  CBC:  Recent Labs Lab 12/05/15 2205 12/09/15 1517 12/11/15 1419  WBC 10.9* 11.5* 9.1  NEUTROABS  --   --  7.2  HGB 13.6 13.1 13.6  HCT 42.4 40.3 41.8  MCV 85.3 83.8 83.1  PLT 267 270 287   Basic Metabolic Panel:  Recent Labs Lab 12/05/15 2205 12/09/15 1517 12/11/15 1419  NA 139 137 137  K 3.1* 2.9* 2.8*  CL 103 100* 100*  CO2 27 27 28   GLUCOSE 159* 130* 298*  BUN 14 13 8   CREATININE 1.81* 2.17* 1.63*  CALCIUM 9.1 8.7* 8.8*   GFR: Estimated Creatinine Clearance: 82.1 mL/min (by C-G formula based on Cr of 1.63). Liver Function Tests:  Recent Labs Lab 12/05/15 2205 12/09/15 1517 12/11/15 1419  AST 18 19 16   ALT 16* 16* 14*  ALKPHOS 93 88 90  BILITOT 0.6 0.5 0.6  PROT 6.7 6.6 6.6  ALBUMIN 2.7* 2.7* 2.6*    Recent Labs Lab 12/05/15 2205 12/09/15 1517 12/11/15 1419  LIPASE 47 20 28   No results for input(s): AMMONIA in the last 168 hours. Coagulation Profile: No results for input(s): INR, PROTIME in the last 168 hours. Cardiac Enzymes: No results for input(s): CKTOTAL, CKMB, CKMBINDEX, TROPONINI in the last 168 hours. BNP (last 3 results) No results for input(s): PROBNP in the last 8760 hours. HbA1C: No results for input(s): HGBA1C in the last 72 hours. CBG:  Recent Labs Lab 12/11/15 1343  GLUCAP  304*   Lipid Profile: No results for input(s): CHOL, HDL, LDLCALC, TRIG, CHOLHDL, LDLDIRECT in the last 72 hours. Thyroid Function Tests: No results for input(s): TSH, T4TOTAL, FREET4, T3FREE, THYROIDAB in the last 72 hours. Anemia Panel: No results for input(s): VITAMINB12, FOLATE, FERRITIN, TIBC, IRON, RETICCTPCT in the last 72 hours. Urine analysis:    Component Value Date/Time   COLORURINE YELLOW 12/11/2015 1600   APPEARANCEUR CLEAR 12/11/2015 1600   LABSPEC 1.011 12/11/2015 1600   PHURINE 7.0 12/11/2015 1600   GLUCOSEU 250* 12/11/2015 1600   HGBUR SMALL* 12/11/2015 1600   BILIRUBINUR NEGATIVE 12/11/2015 1600   KETONESUR NEGATIVE 12/11/2015 1600   PROTEINUR >300* 12/11/2015 1600   UROBILINOGEN 1.0 01/17/2015 2230   NITRITE NEGATIVE 12/11/2015 1600   LEUKOCYTESUR NEGATIVE 12/11/2015 1600   Sepsis Labs: No results found for this or any previous visit (from the past 240 hour(s)).   Radiological Exams on Admission: Ct Abdomen Pelvis W Contrast  12/11/2015  ADDENDUM REPORT: 12/11/2015 18:48 ADDENDUM: Small umbilical hernia measures 3 cm (image 113, series 6). No change from 03/10/2014. Well-circumscribed lytic lesion in the LEFT iliac bone appears benign. Electronically Signed   By: Genevive BiStewart  Edmunds M.D.   On: 12/11/2015 18:48  12/11/2015  CLINICAL DATA:  Abdominal pain, nausea and vomiting. EXAM: CT ABDOMEN AND PELVIS WITH CONTRAST TECHNIQUE: Multidetector CT imaging of the abdomen and pelvis was performed using the standard protocol following bolus administration of intravenous contrast. CONTRAST:  1 ISOVUE-300 IOPAMIDOL (ISOVUE-300) INJECTION 61% COMPARISON:  CT 03/10/2014 FINDINGS: Lower chest: There is a band of atelectasis in the RIGHT middle lobe and linear atelectasis in the LEFT RIGHT lower lobe. No pleural fluid or airspace disease identified. Hepatobiliary: No focal hepatic lesion. The gallbladder is normal. No biliary duct dilatation. Pancreas: Pancreas is normal. No ductal  dilatation. No pancreatic inflammation. Spleen: Normal spleen Adrenals/urinary tract: Adrenal glands and kidneys are normal. The ureters and bladder normal. Stomach/Bowel: Stomach, small bowel, appendix, and cecum are normal. The colon and rectosigmoid colon are normal. Vascular/Lymphatic: Abdominal aorta is normal caliber. There is no retroperitoneal or periportal lymphadenopathy. No pelvic lymphadenopathy. Reproductive: Prostate normal. Other: No free-fluid.  No ventral hernia or  inguinal hernia. Musculoskeletal: Internal fixation of RIGHT hemipelvis. No acute findings. IMPRESSION: 1. Bibasilar atelectasis. 2. Normal appendix and gallbladder. 3. No ureteral obstruction. Electronically Signed: By: Genevive Bi M.D. On: 12/11/2015 18:42    EKG: Independently reviewed. Normal sinus rhythm with left axis deviation  Assessment/Plan Intractable nausea and vomiting/ history of gastroparesis: Acute on chronic. Patient with recurrent bouts of nausea vomiting unable to keep any food or liquids down not currently held with Reglan.  CT scan of the abdomen  showed no significant abnormality for patient's symptoms. Patient also noted to have a history of pain medication abuse. Differential includes gastroparesis, esophageal obstruction, or withdrawals from opioids. UDS of low utility as patient had already received IV morphine. - Admitted to a MedSurg bed - Normal saline IV fluids at 142ml/hr - Zofran/Compazine IV prn N/V - Continue Reglan - Clear liquid diet advance as tolerated - Dietitian consult with history of gastroparesis  Hypokalemia: Potassium 2.8 meq Patient given 30 mEq of potassium chloride in the ED with 1 g of magnesium sulfate. - Additional potassium chloride for 30 meq - Continue to monitor and replace as needed  Dysphagia: Patient complains of feeling like food/pills getting stuck in the center of his chest. 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 Patient with uncontrolled blood glucose readings at home. last hemoglobin A1c on file noted to be 11.6 in 05/2015. Question if progression of patient's disease and possible gastroparesis is due to continued noncompliance with diabetic regimen. - Check hemoglobin A1c in a.m.  - Hypoglycemic protocols - Continue Lyrica - Held metformin - continue Levemir 40 units subcutaneous at bedtime - CBGs every before meals with a moderate sliding scale insulin - diabetic education  Chronic kidney disease stage III: Patient's baseline creatinine ranges from 1.4-2. On presentation patient's creatinine was improved from 2.172 days ago to 1.63. - Continue to monitor  Essential hypertension - Continue amlodipine, hydralazine   Obstructive sleep apnea: While sleeping patient seen to have O2 saturations dropped into the 60-70s on not on the CPAP mask. - RT to supply CPAP  GERD - protonix  DVT prophylaxis: Lovenox   Code Status: Full code  Family Communication: Discussed overall plan with patient's wife present at bedside  Disposition Plan: Likely discharge home in 1-2 days Consults called: None Admission status:  observation MedSurg  Clydie Braun MD [facility] Pager [phone]  If 7PM-7AM, please contact night-coverage [email or web address] Password [facility]  12/11/2015, 7:38 PM

## 2015-12-11 NOTE — ED Provider Notes (Signed)
CSN: [id number]     Arrival date & time 12/11/15  1323 History   First MD Initiated Contact with Patient 12/11/15 1350     Chief Complaint  Patient presents with  . Abdominal Pain     (Consider location/radiation/quality/duration/timing/severity/associated sxs/prior Treatment) HPI Patient presents to the emergency department with continued abdominal discomfort with nausea, vomiting.  The patient states that he has a history gastroparesis, and he has had several days' worth of vomiting and cannot stop.  Patient states that he is unable to take his medications due to the vomiting.  Patient was seen here recently for similar symptoms and discharged home.  The patient states he did feel some better following that visit, but was never completely better.  The patient states that nothing seems make her condition better or worse.  The patient states that he did not follow up with his primary care doctor. The patient denies chest pain, shortness of breath, headache,blurred vision, neck pain, fever, cough, weakness, numbness, dizziness, anorexia, edema,diarrhea, rash, back pain, dysuria, hematemesis, bloody stool, near syncope, or syncope. Past Medical History  Diagnosis Date  . Sickle cell trait (HCC)   . Diabetes (HCC)   . Obstructive sleep apnea     will not wear CPAP  . Asthma   . GERD (gastroesophageal reflux disease)   . Gastroparesis   . Kidney disorder   . Neuropathy (HCC)   . Obesity   . Hypertension   . Renal insufficiency    Past Surgical History  Procedure Laterality Date  . Hip fracture surgery Right 1999    "put a plate in"   Family History  Problem Relation Age of Onset  . Diabetes Father   . Neuropathy Father   . Hypertension Father   . Sickle cell anemia Mother   . Kidney disease Brother   . Kidney disease Sister   . Aneurysm Father   . Heart attack Mother    Social History  Substance Use Topics  . Smoking status: Never Smoker   . Smokeless tobacco: Never Used  .  Alcohol Use: No    Review of Systems  All other systems negative except as documented in the HPI. All pertinent positives and negatives as reviewed in the HPI.  Allergies  Angiotensin receptor blockers and Lisinopril  Home Medications   Prior to Admission medications   Medication Sig Start Date End Date Taking? Authorizing Provider  amLODipine (NORVASC) 5 MG tablet Take 1 tablet (5 mg total) by mouth daily. 06/06/15  Yes Alison MurrayAlma M Devine, MD  diazepam (VALIUM) 1 MG/ML solution Take 5 mLs (5 mg total) by mouth every 8 (eight) hours as needed (nausea/vomiting). 12/09/15  Yes Trixie DredgeEmily West, PA-C  dicyclomine (BENTYL) 20 MG tablet Take 1 tablet (20 mg total) by mouth 2 (two) times daily. 05/29/15  Yes Benjamin Cartner, PA-C  furosemide (LASIX) 40 MG tablet Take 40 mg by mouth 2 (two) times daily. 10/15/15  Yes Historical Provider, MD  hydrALAZINE (APRESOLINE) 50 MG tablet Take 1 tablet (50 mg total) by mouth 3 (three) times daily. 06/06/15  Yes Alison MurrayAlma M Devine, MD  insulin aspart (NOVOLOG) 100 UNIT/ML injection Inject 10-20 Units into the skin 3 (three) times daily with meals. per sliding scale CBG 70 - 120: 0 units CBG 121 - 150: 2 units CBG 151 - 200: 3 units CBG 201 - 250: 5 units CBG 251 - 300: 8 units CBG 301 - 350: 11 units CBG 351 - 400: 15 units 06/06/15  Yes Alma  Concepcion Elk, MD  insulin detemir (LEVEMIR) 100 UNIT/ML injection Inject 0.4 mLs (40 Units total) into the skin daily. Patient taking differently: Inject 80 Units into the skin every evening.  06/06/15  Yes Alison Murray, MD  LYRICA 100 MG capsule Take 100 mg by mouth 3 (three) times daily. 05/02/15  Yes Historical Provider, MD  metFORMIN (GLUCOPHAGE) 500 MG tablet Take 500 mg by mouth 3 (three) times daily. 09/22/15  Yes Historical Provider, MD  metoCLOPramide (REGLAN) 10 MG tablet Take 1 tablet (10 mg total) by mouth every 8 (eight) hours as needed for nausea or vomiting. 12/06/15  Yes Trixie Dredge, PA-C  metoprolol succinate (TOPROL-XL)  50 MG 24 hr tablet Take 50 mg by mouth daily. 10/18/15  Yes Historical Provider, MD  Oxycodone HCl 10 MG TABS Take 1 tablet by mouth every 6 (six) hours as needed (pain).  04/17/14  Yes Historical Provider, MD  potassium chloride SA (K-DUR,KLOR-CON) 20 MEQ tablet Take 2 tablets (40 mEq total) by mouth daily. 12/06/15  Yes Trixie Dredge, PA-C  traMADol (ULTRAM) 50 MG tablet Take 1 tablet (50 mg total) by mouth every 6 (six) hours as needed. Patient taking differently: Take 50 mg by mouth every 6 (six) hours as needed for moderate pain.  07/29/15  Yes Ankit Nanavati, MD   BP 123/73 mmHg  Pulse 74  Temp(Src) 97.9 F (36.6 C) (Oral)  Resp 18  SpO2 94% Physical Exam  Constitutional: He is oriented to person, place, and time. He appears well-developed and well-nourished. No distress.  HENT:  Head: Normocephalic and atraumatic.  Mouth/Throat: Uvula is midline. Mucous membranes are dry.  Eyes: Pupils are equal, round, and reactive to light.  Neck: Normal range of motion. Neck supple.  Cardiovascular: Normal rate, regular rhythm and normal heart sounds.  Exam reveals no gallop and no friction rub.   No murmur heard. Pulmonary/Chest: Effort normal and breath sounds normal. No respiratory distress. He has no wheezes.  Abdominal: Soft. Bowel sounds are normal. He exhibits no distension. There is no tenderness.  Neurological: He is alert and oriented to person, place, and time. He exhibits normal muscle tone. Coordination normal.  Skin: Skin is warm and dry. No rash noted. No erythema.  Psychiatric: He has a normal mood and affect. His behavior is normal.  Nursing note and vitals reviewed.   ED Course  Procedures (including critical care time) Labs Review Labs Reviewed  COMPREHENSIVE METABOLIC PANEL - Abnormal; Notable for the following:    Potassium 2.8 (*)    Chloride 100 (*)    Glucose, Bld 298 (*)    Creatinine, Ser 1.63 (*)    Calcium 8.8 (*)    Albumin 2.6 (*)    ALT 14 (*)    GFR calc  non Af Amer 49 (*)    GFR calc Af Amer 57 (*)    All other components within normal limits  URINALYSIS, ROUTINE W REFLEX MICROSCOPIC (NOT AT [facility]) - Abnormal; Notable for the following:    Glucose, UA 250 (*)    Hgb urine dipstick SMALL (*)    Protein, ur >300 (*)    All other components within normal limits  URINE MICROSCOPIC-ADD ON - Abnormal; Notable for the following:    Squamous Epithelial / LPF 0-5 (*)    Bacteria, UA RARE (*)    Casts HYALINE CASTS (*)    All other components within normal limits  CBG MONITORING, ED - Abnormal; Notable for the following:    Glucose-Capillary  304 (*)    All other components within normal limits  I-STAT CG4 LACTIC ACID, ED - Abnormal; Notable for the following:    Lactic Acid, Venous 2.41 (*)    All other components within normal limits  I-STAT VENOUS BLOOD GAS, ED - Abnormal; Notable for the following:    pH, Ven 7.580 (*)    pCO2, Ven 33.9 (*)    Bicarbonate 31.7 (*)    Acid-Base Excess 10.0 (*)    All other components within normal limits  CBC WITH DIFFERENTIAL/PLATELET  LIPASE, BLOOD    Imaging Review Ct Abdomen Pelvis W Contrast  12/11/2015  ADDENDUM REPORT: 12/11/2015 18:48 ADDENDUM: Small umbilical hernia measures 3 cm (image 113, series 6). No change from 03/10/2014. Well-circumscribed lytic lesion in the LEFT iliac bone appears benign. Electronically Signed   By: Genevive BiStewart  Edmunds M.D.   On: 12/11/2015 18:48  12/11/2015  CLINICAL DATA:  Abdominal pain, nausea and vomiting. EXAM: CT ABDOMEN AND PELVIS WITH CONTRAST TECHNIQUE: Multidetector CT imaging of the abdomen and pelvis was performed using the standard protocol following bolus administration of intravenous contrast. CONTRAST:  1 ISOVUE-300 IOPAMIDOL (ISOVUE-300) INJECTION 61% COMPARISON:  CT 03/10/2014 FINDINGS: Lower chest: There is a band of atelectasis in the RIGHT middle lobe and linear atelectasis in the LEFT RIGHT lower lobe. No pleural fluid or airspace disease identified.  Hepatobiliary: No focal hepatic lesion. The gallbladder is normal. No biliary duct dilatation. Pancreas: Pancreas is normal. No ductal dilatation. No pancreatic inflammation. Spleen: Normal spleen Adrenals/urinary tract: Adrenal glands and kidneys are normal. The ureters and bladder normal. Stomach/Bowel: Stomach, small bowel, appendix, and cecum are normal. The colon and rectosigmoid colon are normal. Vascular/Lymphatic: Abdominal aorta is normal caliber. There is no retroperitoneal or periportal lymphadenopathy. No pelvic lymphadenopathy. Reproductive: Prostate normal. Other: No free-fluid.  No ventral hernia or inguinal hernia. Musculoskeletal: Internal fixation of RIGHT hemipelvis. No acute findings. IMPRESSION: 1. Bibasilar atelectasis. 2. Normal appendix and gallbladder. 3. No ureteral obstruction. Electronically Signed: By: Genevive BiStewart  Edmunds M.D. On: 12/11/2015 18:42   I have personally reviewed and evaluated these images and lab results as part of my medical decision-making.   EKG Interpretation None      The patient will need admission to the hospital for further fluid resuscitation and repletion of his potassium.  The patient has no abnormalities noted on CT scan and her acute.  I spoke with the [facility] hospitalist to evaluate the patient for admission    Charlestine NightChristopher Treyvonne Tata, PA-C 12/11/15 1941  Marily MemosJason Mesner, MD 12/13/15 [id number]

## 2015-12-12 ENCOUNTER — Encounter (HOSPITAL_COMMUNITY): Payer: Self-pay

## 2015-12-12 ENCOUNTER — Observation Stay (HOSPITAL_COMMUNITY): Payer: Medicare Other

## 2015-12-12 DIAGNOSIS — E1143 Type 2 diabetes mellitus with diabetic autonomic (poly)neuropathy: Secondary | ICD-10-CM | POA: Diagnosis not present

## 2015-12-12 DIAGNOSIS — R1310 Dysphagia, unspecified: Secondary | ICD-10-CM | POA: Diagnosis not present

## 2015-12-12 DIAGNOSIS — E876 Hypokalemia: Secondary | ICD-10-CM

## 2015-12-12 DIAGNOSIS — R1110 Vomiting, unspecified: Secondary | ICD-10-CM

## 2015-12-12 DIAGNOSIS — K3184 Gastroparesis: Secondary | ICD-10-CM

## 2015-12-12 DIAGNOSIS — G4733 Obstructive sleep apnea (adult) (pediatric): Secondary | ICD-10-CM

## 2015-12-12 LAB — BASIC METABOLIC PANEL
Anion gap: 7 (ref 5–15)
BUN: <5 mg/dL — ABNORMAL LOW (ref 6–20)
CHLORIDE: 105 mmol/L (ref 101–111)
CO2: 32 mmol/L (ref 22–32)
Calcium: 8.4 mg/dL — ABNORMAL LOW (ref 8.9–10.3)
Creatinine, Ser: 1.46 mg/dL — ABNORMAL HIGH (ref 0.61–1.24)
GFR calc non Af Amer: 56 mL/min — ABNORMAL LOW (ref >60)
Glucose, Bld: 113 mg/dL — ABNORMAL HIGH (ref 65–99)
POTASSIUM: 2.8 mmol/L — AB (ref 3.5–5.1)
SODIUM: 144 mmol/L (ref 135–145)

## 2015-12-12 LAB — CBC
HCT: 41.1 % (ref 39.0–52.0)
HEMOGLOBIN: 13.1 g/dL (ref 13.0–17.0)
MCH: 27.2 pg (ref 26.0–34.0)
MCHC: 31.9 g/dL (ref 30.0–36.0)
MCV: 85.3 fL (ref 78.0–100.0)
PLATELETS: 266 10*3/uL (ref 150–400)
RBC: 4.82 MIL/uL (ref 4.22–5.81)
RDW: 14.4 % (ref 11.5–15.5)
WBC: 8.1 10*3/uL (ref 4.0–10.5)

## 2015-12-12 LAB — GLUCOSE, CAPILLARY
GLUCOSE-CAPILLARY: 99 mg/dL (ref 65–99)
Glucose-Capillary: 124 mg/dL — ABNORMAL HIGH (ref 65–99)
Glucose-Capillary: 137 mg/dL — ABNORMAL HIGH (ref 65–99)
Glucose-Capillary: 162 mg/dL — ABNORMAL HIGH (ref 65–99)

## 2015-12-12 LAB — POTASSIUM: Potassium: 3.5 mmol/L (ref 3.5–5.1)

## 2015-12-12 LAB — MAGNESIUM: MAGNESIUM: 1.7 mg/dL (ref 1.7–2.4)

## 2015-12-12 MED ORDER — FUROSEMIDE 40 MG PO TABS: 40.0000 mg | ORAL_TABLET | Freq: Every day | ORAL | Status: DC

## 2015-12-12 MED ORDER — POTASSIUM CHLORIDE 10 MEQ/100ML IV SOLN
10.0000 meq | Freq: Once | INTRAVENOUS | Status: AC
  Administered 2015-12-12: 10 meq via INTRAVENOUS

## 2015-12-12 MED ORDER — INSULIN ASPART 100 UNIT/ML ~~LOC~~ SOLN: 0.0000 [IU] | Freq: Every day | SUBCUTANEOUS | Status: DC

## 2015-12-12 MED ORDER — METOCLOPRAMIDE HCL 5 MG/ML IJ SOLN
5.0000 mg | Freq: Once | INTRAMUSCULAR | Status: AC
  Administered 2015-12-12: 5 mg via INTRAVENOUS
  Filled 2015-12-12: qty 2

## 2015-12-12 MED ORDER — MAGNESIUM SULFATE 50 % IJ SOLN
3.0000 g | Freq: Once | INTRAMUSCULAR | Status: AC
  Administered 2015-12-12: 3 g via INTRAVENOUS
  Filled 2015-12-12: qty 6

## 2015-12-12 MED ORDER — DIAZEPAM 5 MG PO TABS: 5.0000 mg | ORAL_TABLET | Freq: Three times a day (TID) | ORAL | Status: DC | PRN

## 2015-12-12 MED ORDER — INSULIN DETEMIR 100 UNIT/ML ~~LOC~~ SOLN
60.0000 [IU] | Freq: Every day | SUBCUTANEOUS | Status: DC
  Filled 2015-12-12: qty 0.6

## 2015-12-12 MED ORDER — POTASSIUM CHLORIDE 20 MEQ/15ML (10%) PO SOLN
40.0000 meq | Freq: Once | ORAL | Status: AC
  Administered 2015-12-12: 40 meq via ORAL
  Filled 2015-12-12: qty 30

## 2015-12-12 MED ORDER — PROMETHAZINE HCL 12.5 MG PO TABS: 12.5000 mg | ORAL_TABLET | Freq: Four times a day (QID) | ORAL | Status: DC | PRN

## 2015-12-12 MED ORDER — POTASSIUM CHLORIDE 10 MEQ/100ML IV SOLN
10.0000 meq | INTRAVENOUS | Status: AC
  Administered 2015-12-12 (×3): 10 meq via INTRAVENOUS
  Filled 2015-12-12 (×4): qty 100

## 2015-12-12 MED ORDER — OXYCODONE HCL 5 MG PO TABS: 5.0000 mg | ORAL_TABLET | Freq: Four times a day (QID) | ORAL | Status: DC | PRN

## 2015-12-12 MED ORDER — PANTOPRAZOLE SODIUM 40 MG PO TBEC: 40.0000 mg | DELAYED_RELEASE_TABLET | Freq: Every day | ORAL | Status: DC

## 2015-12-12 MED ORDER — METFORMIN HCL 500 MG PO TABS: 500.0000 mg | ORAL_TABLET | Freq: Three times a day (TID) | ORAL | Status: DC

## 2015-12-12 MED ORDER — CETYLPYRIDINIUM CHLORIDE 0.05 % MT LIQD
7.0000 mL | Freq: Two times a day (BID) | OROMUCOSAL | Status: DC
  Administered 2015-12-12: 7 mL via OROMUCOSAL

## 2015-12-12 MED ORDER — CHLORHEXIDINE GLUCONATE 0.12 % MT SOLN
15.0000 mL | Freq: Two times a day (BID) | OROMUCOSAL | Status: DC
Start: 2015-12-12 — End: 2015-12-13
  Administered 2015-12-12 – 2015-12-13 (×3): 15 mL via OROMUCOSAL
  Filled 2015-12-12 (×3): qty 15

## 2015-12-12 MED ORDER — METOCLOPRAMIDE HCL 5 MG/ML IJ SOLN
10.0000 mg | Freq: Three times a day (TID) | INTRAMUSCULAR | Status: DC
  Administered 2015-12-12 – 2015-12-13 (×3): 10 mg via INTRAVENOUS
  Filled 2015-12-12 (×3): qty 2

## 2015-12-12 MED ORDER — INSULIN ASPART 100 UNIT/ML ~~LOC~~ SOLN
0.0000 [IU] | Freq: Three times a day (TID) | SUBCUTANEOUS | Status: DC
  Administered 2015-12-12: 1 [IU] via SUBCUTANEOUS
  Administered 2015-12-13: 2 [IU] via SUBCUTANEOUS

## 2015-12-12 MED ORDER — INSULIN ASPART 100 UNIT/ML ~~LOC~~ SOLN: 10.0000 [IU] | Freq: Three times a day (TID) | SUBCUTANEOUS | Status: DC

## 2015-12-12 MED ORDER — POTASSIUM CHLORIDE CRYS ER 20 MEQ PO TBCR: 40.0000 meq | EXTENDED_RELEASE_TABLET | Freq: Every day | ORAL | Status: DC

## 2015-12-12 MED ORDER — POTASSIUM CHLORIDE 20 MEQ PO PACK: 20.0000 meq | PACK | Freq: Every day | ORAL | Status: DC

## 2015-12-12 MED ORDER — PANTOPRAZOLE SODIUM 40 MG PO TBEC
40.0000 mg | DELAYED_RELEASE_TABLET | Freq: Every day | ORAL | Status: DC
  Administered 2015-12-12 – 2015-12-13 (×2): 40 mg via ORAL
  Filled 2015-12-12 (×2): qty 1

## 2015-12-12 MED ORDER — INSULIN DETEMIR 100 UNIT/ML ~~LOC~~ SOLN
50.0000 [IU] | Freq: Every day | SUBCUTANEOUS | Status: DC
  Administered 2015-12-12: 50 [IU] via SUBCUTANEOUS
  Filled 2015-12-12 (×2): qty 0.5

## 2015-12-12 NOTE — Plan of Care (Signed)
Problem: Food- and Nutrition-Related Knowledge Deficit (NB-1.1) Goal: Nutrition education Formal process to instruct or train a patient/client in a skill or to impart knowledge to help patients/clients voluntarily manage or modify food choices and eating behavior to maintain or improve health. Outcome: Adequate for Discharge  RD consulted for nutrition education regarding diabetes.     Lab Results  Component Value Date    HGBA1C 11.1* 06/02/2015   Spoke with pt at bedside. He reports poor appetite over the past weeks, due to nausea, which he attributes to gastroparesis. Pt admits to poor glycemic control- relays CBGS usually range from 150-275. Pt consumes 2 meals per day (2 PM and 6 PM), which consist mainly of fast food.   Pt acknowledges that he was educated about gastroparesis recently, however, when asked to recall principles, he reports "I forgot".   Pt did not engage RD well at time of visit (made limited eye contact and a lot of probing was required to obtain hx).   RD provided "Carbohydrate Counting for People with Diabetes" and "Gastroparesis Nutrition Therapy" handouts from the Academy of [employer]etics. Discussed different food groups and their effects on blood sugar, emphasizing carbohydrate-containing foods. Provided list of carbohydrates and recommended serving sizes of common foods.  Discussed importance of controlled and consistent carbohydrate intake throughout the day. Provided examples of ways to balance meals/snacks.    Discussed importance of optimizing glycemic control to assist with management of gastroparesis. Educated pt on importance of consuming 4-6 small meals per day to stimulate digestive process. Encouraged pt to limit high fat and high fiber foods.Teach back method used.  Expect poor compliance.  Body mass index is 44.09 kg/(m^2). Pt meets criteria for extreme obesity, class III based on current BMI.  Current diet order is Carb Modified, patient is  consuming approximately 75-100% of meals at this time. Labs and medications reviewed. No further nutrition interventions warranted at this time. RD contact information provided. If additional nutrition issues arise, please re-consult RD.  Chalice Philbert A. Mayford KnifeWilliams, RD, LDN, CDE Pager: [phone] After hours Pager: [phone]

## 2015-12-12 NOTE — Progress Notes (Signed)
[facility]                                                                              Patient Demographics  Kenneth Fox, is a 45 y.o. male, DOB - 1970-12-07, ZOX:[id number]  Admit date - 12/11/2015   Admitting Physician Clydie Braun, MD  Outpatient Primary MD for the patient is LONG,[name], PA-C  Outpatient specialists:   LOS -   days    Chief Complaint  Patient presents with  . Abdominal Pain       Brief summary   Kenneth Fox is a 45 y.o. male with medical history significant of IDDM with complications neuropathy and gastroparesis, HTN, oxygen dependent, CKD stage III, OSA, and GERD; presents with complaints of stomach pain, nausea, and vomiting. Symptoms have been intermittent over the last 2 weeks. Pain is generalized across his whole abdomen. He states that he's had difficulty in taking any of his oral medications as it feels like they are getting stuck in the center of his chest. He reports this burning queasy sensation that is across his whole abdomen. Patient's wife present at bedside reported that over the last 4 days he's not been able to keep much of his food or liquids down. He reported taking Reglan without much relief of symptoms.  Seen in the ED 2 days ago with similar complaints, but was able to tolerate oral medications and was able to be discharged home. He has been somewhat noncompliant with insulin as well   Assessment & Plan   Intractable nausea and vomiting/ history of gastroparesis: Acute on chronic.  - CT abdomen and pelvis negative for any SBO or acute abdominal pathology. LFT's WNL. - At the time of admission, patient had reported intractable nausea, vomiting and abdominal pain.  - Esophago-gram was ordered to rule out any stricture or aphasia, however early this a.m., patient reported his symptoms had resolved. Esophagogram was canceled.  - Patient requested his diet to be advanced to solids. Patient was able to tolerate his  breakfast however after taking potassium supplementation, patient started having symptoms of nausea - Placed on PPI, antiemetics, esophagogram, will consult GI if any abnormality  Hypokalemia, hypomagnesemia: Potassium 2.8  - Placed on IV potassium supplementation, oral liquid solution -  recheck potassium, magnesium at 1 PM today  Dysphagia: Patient complains of feeling like food/pills getting stuck in the center of his chest. 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 Patient with uncontrolled blood glucose readings at home. last hemoglobin A1c on file noted to be 11.6 in 05/2015.  - Uncontrolled, increased Lantus to 60 units at bedtime, add meal coverage NovoLog, with sliding scale insulin  Lactic acidosis Likely due to dehydration, metformin. Hold metformin  Chronic kidney disease stage III: Patient's baseline creatinine ranges from 1.4-2. On presentation patient's creatinine was improved from 2.172 days ago to 1.63. - Continue to monitor  Essential hypertension - Continue amlodipine, hydralazine   Obstructive sleep apnea: While sleeping patient seen to have O2 saturations dropped into the 60-70s on not on the CPAP mask. - RT to supply  CPAP  GERD - protonix  Chronic diastolic CHF - No JVD, currently compensated - 2-D echo in 12/16 had shown EF of 55-60% with no regional wall motion abnormalities, pulmonary hypertension. - Patient had been taking Lasix 40 mg twice a day, currently somewhat dehydrated, will decrease Lasix to 40 mg once a day to resume at home. Hold while inpatient.   Code Status: full code DVT Prophylaxis:  Lovenox Family Communication: Discussed in detail with the patient, all imaging results, lab results explained to the patient or *   Disposition Plan: DC in  24-48 hours  Time Spent in minutes   25 minutes  Procedures:  CTabd  Consultants:   None   Antimicrobials:       Medications  Scheduled Meds: . amLODipine  5 mg Oral Daily  . antiseptic oral rinse  7 mL Mouth Rinse q12n4p  . chlorhexidine  15 mL Mouth Rinse BID  . dicyclomine  20 mg Oral BID  . enoxaparin (LOVENOX) injection  40 mg Subcutaneous Q24H  . hydrALAZINE  50 mg Oral TID  . insulin aspart  0-15 Units Subcutaneous TID WC  . insulin aspart  0-5 Units Subcutaneous QHS  . insulin detemir  40 Units Subcutaneous QHS  . magnesium sulfate LVP 250-500 ml  3 g Intravenous Once  . metoCLOPramide (REGLAN) injection  5 mg Intravenous Once  . pantoprazole  40 mg Oral Daily  . potassium chloride  10 mEq Intravenous Q1 Hr x 4  . pregabalin  100 mg Oral TID   Continuous Infusions:  PRN Meds:.albuterol, diazepam, ipratropium, metoCLOPramide, ondansetron **OR** ondansetron (ZOFRAN) IV, oxyCODONE   Antibiotics   Anti-infectives    None        Subjective:   Kenneth Fox was seen and examined today. Earlier this morning, patient reported his symptoms of nausea, vomiting, abdominal pain had resolved. However subsequently after taking potassium supplementation started feeling nauseous again.  Patient denies dizziness, chest pain, shortness of breath, new weakness, numbess, tingling. No acute events overnight.    Objective:   Filed Vitals:   12/11/15 2133 12/11/15 2207 12/12/15 0015 12/12/15 0540  BP: 175/99 124/79  124/55  Pulse: 86 80  77  Temp:  97.5 F (36.4 C)  97.4 F (36.3 C)  TempSrc:      Resp: 19 18 16 18   Height:  5\' 11"  (1.803 m)    Weight:  143.337 kg (316 lb)    SpO2: 98% 99%  98%    Intake/Output Summary (Last 24 hours) at 12/12/15 1218 Last data filed at 12/12/15 0845  Gross per 24 hour  Intake   1290 ml  Output   2050 ml  Net   -760 ml     Wt Readings from Last 3 Encounters:  12/11/15 143.337 kg (316 lb)  12/09/15 143.337 kg (316 lb)  12/05/15 143.79 kg (317 lb)     Exam  General: Alert and oriented x 3, NAD  HEENT:    Neck: Supple, no  JVD  Cardiovascular: S1 S2 auscultated, no rubs, murmurs or gallops. Regular rate and rhythm.  Respiratory: Clear to auscultation bilaterally, no wheezing, rales or rhonchi  Gastrointestinal: Obese, Soft, nontender, nondistended, + bowel sounds  Ext: no cyanosis clubbing or edema  Neuro: AAOx3, Cr N's II- XII. Strength 5/5 upper and lower extremities bilaterally  Skin: No rashes  Psych: Normal affect and demeanor, alert and oriented x3    Data Reviewed:  I have personally reviewed following labs and imaging studies  Micro Results No results found for this or any previous visit (from the past 240 hour(s)).  Radiology Reports Ct Abdomen Pelvis W Contrast  12/11/2015  ADDENDUM REPORT: 12/11/2015 18:48 ADDENDUM: Small umbilical hernia measures 3 cm (image 113, series 6). No change from 03/10/2014. Well-circumscribed lytic lesion in the LEFT iliac bone appears benign. Electronically Signed   By: Genevive BiStewart  Edmunds M.D.   On: 12/11/2015 18:48  12/11/2015  CLINICAL DATA:  Abdominal pain, nausea and vomiting. EXAM: CT ABDOMEN AND PELVIS WITH CONTRAST TECHNIQUE: Multidetector CT imaging of the abdomen and pelvis was performed using the standard protocol following bolus administration of intravenous contrast. CONTRAST:  1 ISOVUE-300 IOPAMIDOL (ISOVUE-300) INJECTION 61% COMPARISON:  CT 03/10/2014 FINDINGS: Lower chest: There is a band of atelectasis in the RIGHT middle lobe and linear atelectasis in the LEFT RIGHT lower lobe. No pleural fluid or airspace disease identified. Hepatobiliary: No focal hepatic lesion. The gallbladder is normal. No biliary duct dilatation. Pancreas: Pancreas is normal. No ductal dilatation. No pancreatic inflammation. Spleen: Normal spleen Adrenals/urinary tract: Adrenal glands and kidneys are normal. The ureters and bladder normal. Stomach/Bowel: Stomach, small bowel, appendix, and cecum are normal. The colon and rectosigmoid colon are normal. Vascular/Lymphatic: Abdominal  aorta is normal caliber. There is no retroperitoneal or periportal lymphadenopathy. No pelvic lymphadenopathy. Reproductive: Prostate normal. Other: No free-fluid.  No ventral hernia or inguinal hernia. Musculoskeletal: Internal fixation of RIGHT hemipelvis. No acute findings. IMPRESSION: 1. Bibasilar atelectasis. 2. Normal appendix and gallbladder. 3. No ureteral obstruction. Electronically Signed: By: Genevive BiStewart  Edmunds M.D. On: 12/11/2015 18:42   Dg Abd Acute W/chest  12/06/2015  CLINICAL DATA:  45 year old male with abdominal pain, nausea vomiting. EXAM: DG ABDOMEN ACUTE W/ 1V CHEST COMPARISON:  All radiograph dated 11/02/2015 FINDINGS: Stable appearing bibasilar linear and platelike airspace densities which may represent atelectasis/ scarring. Infiltrate is not excluded. Clinical correlation is recommended. There is no pleural effusion or pneumothorax. Stable cardiac silhouette. There is no bowel dilatation or evidence of obstruction. Air and stool no noted within the colon. There is no free air. No radiopaque calculi. Right acetabular fixation hardware. No acute fractures. IMPRESSION: No evidence of bowel obstruction or free air. Bibasilar atelectasis/ scarring versus less likely infiltrate. Electronically Signed   By: Elgie CollardArash  Radparvar M.D.   On: 12/06/2015 02:45    Lab Data:  CBC:  Recent Labs Lab 12/05/15 2205 12/09/15 1517 12/11/15 1419 12/12/15 0601  WBC 10.9* 11.5* 9.1 8.1  NEUTROABS  --   --  7.2  --   HGB 13.6 13.1 13.6 13.1  HCT 42.4 40.3 41.8 41.1  MCV 85.3 83.8 83.1 85.3  PLT 267 270 287 266   Basic Metabolic Panel:  Recent Labs Lab 12/05/15 2205 12/09/15 1517 12/11/15 1419 12/12/15 0601  NA 139 137 137 144  K 3.1* 2.9* 2.8* 2.8*  CL 103 100* 100* 105  CO2 27 27 28  32  GLUCOSE 159* 130* 298* 113*  BUN 14 13 8  <5*  CREATININE 1.81* 2.17* 1.63* 1.46*  CALCIUM 9.1 8.7* 8.8* 8.4*  MG  --   --   --  1.7   GFR: Estimated Creatinine Clearance: 91.7 mL/min (by C-G  formula based on Cr of 1.46). Liver Function Tests:  Recent Labs Lab 12/05/15 2205 12/09/15 1517 12/11/15 1419  AST 18 19 16   ALT 16* 16* 14*  ALKPHOS 93 88 90  BILITOT 0.6 0.5 0.6  PROT 6.7 6.6 6.6  ALBUMIN 2.7* 2.7* 2.6*    Recent Labs Lab 12/05/15 2205  12/09/15 1517 12/11/15 1419  LIPASE 47 20 28   No results for input(s): AMMONIA in the last 168 hours. Coagulation Profile: No results for input(s): INR, PROTIME in the last 168 hours. Cardiac Enzymes: No results for input(s): CKTOTAL, CKMB, CKMBINDEX, TROPONINI in the last 168 hours. BNP (last 3 results) No results for input(s): PROBNP in the last 8760 hours. HbA1C: No results for input(s): HGBA1C in the last 72 hours. CBG:  Recent Labs Lab 12/11/15 1343 12/11/15 2232 12/12/15 0800  GLUCAP 304* 208* 99   Lipid Profile: No results for input(s): CHOL, HDL, LDLCALC, TRIG, CHOLHDL, LDLDIRECT in the last 72 hours. Thyroid Function Tests: No results for input(s): TSH, T4TOTAL, FREET4, T3FREE, THYROIDAB in the last 72 hours. Anemia Panel: No results for input(s): VITAMINB12, FOLATE, FERRITIN, TIBC, IRON, RETICCTPCT in the last 72 hours. Urine analysis:    Component Value Date/Time   COLORURINE YELLOW 12/11/2015 1600   APPEARANCEUR CLEAR 12/11/2015 1600   LABSPEC 1.011 12/11/2015 1600   PHURINE 7.0 12/11/2015 1600   GLUCOSEU 250* 12/11/2015 1600   HGBUR SMALL* 12/11/2015 1600   BILIRUBINUR NEGATIVE 12/11/2015 1600   KETONESUR NEGATIVE 12/11/2015 1600   PROTEINUR >300* 12/11/2015 1600   UROBILINOGEN 1.0 01/17/2015 2230   NITRITE NEGATIVE 12/11/2015 1600   LEUKOCYTESUR NEGATIVE 12/11/2015 1600     RAI,RIPUDEEP M.D. [facility] 12/12/2015, 12:18 PM  Pager: [phone] Between 7am to 7pm - call Pager - [phone]  After 7pm go to [email or web address] - password [email or web address]  Call night coverage person covering after 7pm

## 2015-12-12 NOTE — Evaluation (Addendum)
Clinical/Bedside Swallow Evaluation Patient Details  Name: Kenneth Fox MRN: [id number] Date of Birth: [birth year]  Today's Date: 12/12/2015 Time: SLP Start Time (ACUTE ONLY): 0830 SLP Stop Time (ACUTE ONLY): 0842 SLP Time Calculation (min) (ACUTE ONLY): 12 min  Past Medical History:  Past Medical History  Diagnosis Date  . Sickle cell trait (HCC)   . Diabetes (HCC)   . Obstructive sleep apnea     will not wear CPAP  . Asthma   . GERD (gastroesophageal reflux disease)   . Gastroparesis   . Kidney disorder   . Neuropathy (HCC)   . Obesity   . Hypertension   . Renal insufficiency    Past Surgical History:  Past Surgical History  Procedure Laterality Date  . Hip fracture surgery Right 1999    "put a plate in"   HPI:  Kenneth Fox is a 45 y.o. male with medical history significant of IDDM with complications neuropathy and gastroparesis, HTN, oxygen dependent, CKD stage III, OSA, and GERD; presents with complaints of stomach pain, nausea, and vomiting. Symptoms have been intermittent over the last 2 weeks. Pain is generalized across his whole abdomen. He states that he's had difficulty in taking any of his oral medications as it feels like they are getting stuck in the center of his chest. He reports this burning queasy sensation that is across his whole abdomen. Patient's wife is present at bedside and provides additional history stating that over the last 4 days he's not been able to keep much of his food or liquids down. He reports trying to take his medications of Reglan without much relief of symptoms. Reports that he has not been taking his insulin because he's not been able to eat. When asked about his blood sugars he states that they're up and down. In listening to the patient's responses he does not routinely check blood sugars and give insulin as advised. Seen in the ED 2 days ago with similar complaints, but was able to tolerate oral medications and was able to be  discharged home.    Assessment / Plan / Recommendation Clinical Impression  Pt seen for clinical assessment of swallow given complaints of difficulty with food getting stuck substernally prior to admission. Pt reports that symptoms have largely resolved at this point and he is not having trouble with PO liquids or solids including meds. Pt was observed with regular textures which [facility]. Careful mastication self-initiated by the patient. Of note, pt did endorse "starving himself" and not eating until late in the afternoon in an effort to not gain weight. Pt reports that a dietician came to his home years ago, but he doesn't remember what they suggested. He reports openness to receiving a consult with a dietician now to see if there might be better ways to support his health/weight/blood glucose/gastroparesis with diet. No further SLP involvement indicated, however recommend consult with dietician.     Aspiration Risk  No limitations    Diet Recommendation Regular;Thin liquid   Liquid Administration via: Cup;Straw Medication Administration: Whole meds with liquid Supervision: Patient able to self feed Compensations: Small sips/bites;Follow solids with liquid Postural Changes: Seated upright at 90 degrees    Other  Recommendations Recommended Consults:  (pt reports he will be having GI workup as an outpatient) Oral Care Recommendations: Oral care BID   Follow up Recommendations  None    Frequency and Duration            Prognosis  Swallow Study   General Date of Onset: 12/11/15 HPI: Kenneth Fox is a 45 y.o. male with medical history significant of IDDM with complications neuropathy and gastroparesis, HTN, oxygen dependent, CKD stage III, OSA, and GERD; presents with complaints of stomach pain, nausea, and vomiting. Symptoms have been intermittent over the last 2 weeks. Pain is generalized across his whole abdomen. He states that he's had difficulty in taking any of  his oral medications as it feels like they are getting stuck in the center of his chest. He reports this burning queasy sensation that is across his whole abdomen. Patient's wife is present at bedside and provides additional history stating that over the last 4 days he's not been able to keep much of his food or liquids down. He reports trying to take his medications of Reglan without much relief of symptoms. Reports that he has not been taking his insulin because he's not been able to eat. When asked about his blood sugars he states that they're up and down. In listening to the patient's responses he does not routinely check blood sugars and give insulin as advised. Seen in the ED 2 days ago with similar complaints, but was able to tolerate oral medications and was able to be discharged home.  Type of Study: Bedside Swallow Evaluation Previous Swallow Assessment: none known Diet Prior to this Study: Regular;Thin liquids Temperature Spikes Noted: No Respiratory Status: Room air History of Recent Intubation: No Behavior/Cognition: Alert;Cooperative;Pleasant mood Oral Cavity Assessment: Within Functional Limits Oral Care Completed by SLP: No Oral Cavity - Dentition: Adequate natural dentition Vision: Functional for self-feeding Self-Feeding Abilities: Able to feed self Patient Positioning:  (upright at EOB) Baseline Vocal Quality: Normal Volitional Cough: Strong Volitional Swallow: Able to elicit    Oral/Motor/Sensory Function Overall Oral Motor/Sensory Function: Within functional limits   Ice Chips Ice chips: Not tested   Thin Liquid Thin Liquid: Within functional limits Presentation: Cup    Nectar Thick Nectar Thick Liquid: Not tested   Honey Thick Honey Thick Liquid: Not tested   Puree Puree: Within functional limits Presentation: Self Fed;Spoon   Solid   GO   Solid: Within functional limits Presentation: Self Fed    Functional Assessment Tool Used: clinician  judgement Functional Limitations: Swallowing Swallow Current Status (Z6109(G8996): 0 percent impaired, limited or restricted Swallow Goal Status (U0454(G8997): 0 percent impaired, limited or restricted Swallow Discharge Status [phone](G8998): 0 percent impaired, limited or restricted   Rocky CraftsKara E Therman Hughlett  MA, CCC-SLP Pager [phone]  12/12/2015,9:00 AM

## 2015-12-12 NOTE — Progress Notes (Signed)
Inpatient Diabetes Program Recommendations  AACE/ADA: New Consensus Statement on Inpatient Glycemic Control (2015)  Target Ranges:  Prepandial:   less than 140 mg/dL      Peak postprandial:   less than 180 mg/dL (1-2 hours)      Critically ill patients:  140 - 180 mg/dL   Lab Results  Component Value Date   GLUCAP 99 12/12/2015   HGBA1C 11.1* 06/02/2015   Results for Kenneth Fox, Kenneth Fox (MRN [id number]) as of 12/12/2015 11:12  Ref. Range 12/11/2015 13:43 12/11/2015 22:32 12/12/2015 08:00  Glucose-Capillary Latest Ref Range: 65-99 mg/dL 409304 (H) 811208 (H) 99   [id number] pending. Last one 06/02/2015 - 11.1%.  Review of Glycemic Control  Diabetes history: DM2 Outpatient Diabetes medications: Levemir 80 units QHS, Novolog s/s, metformin 500 mg tid Current orders for Inpatient glycemic control: Levemir 40 units QHS, Novolog moderate tidwc and hs  Spoke at length with pt regarding his glucose control. Pt states he's been under a lot of stress lately with home situation. Has not been taking care of himself, and sometimes forgets to take his insulin. Does not monitor regularly.  Inpatient Diabetes Program Recommendations:    Levemir 60 units QHS Novolog 12 units tidwc for meal coverage.  Continue Novolog correction scale at home.  F/U with MD - Dr. [name] Long  Thank you. Ailene Ardshonda Donyale Falcon, RD, LDN, CDE Inpatient Diabetes Coordinator [phone]

## 2015-12-12 NOTE — Progress Notes (Signed)
NURSING PROGRESS NOTE  [name] [id number] Admission Data: 12/12/2015 1:49 AM Attending Provider: Clydie Braunondell A Smith, MD WUJ:WJXB,JYNWGPCP:LONG,[name], PA-C Code Status: Full  Allergies:  Angiotensin receptor blockers and Lisinopril Past Medical History:   has a past medical history of Sickle cell trait (HCC); Diabetes (HCC); Obstructive sleep apnea; Asthma; GERD (gastroesophageal reflux disease); Gastroparesis; Kidney disorder; Neuropathy (HCC); Obesity; Hypertension; and Renal insufficiency. Past Surgical History:   has past surgical history that includes Hip fracture surgery (Right, 1999). Social History:   reports that he has never smoked. He has never used smokeless tobacco. He reports that he does not drink alcohol or use illicit drugs.  Kenneth Fox is a 45 y.o. male patient admitted from ED:   Last Documented Vital Signs: Blood pressure 124/79, pulse 80, temperature 97.5 F (36.4 C), temperature source Oral, resp. rate 16, height 5\' 11"  (1.803 m), weight 143.337 kg (316 lb), SpO2 99 %.  IV Fluids:  IV in place, occlusive dsg intact without redness, IV cath forearm right, condition patent and no redness normal saline.   Skin: Appropriate for ethnicity, dry and flaky.  Patient/Family orientated to room. Information packet given to patient/family. Admission inpatient armband information verified with patient/family to include name and date of birth and placed on patient arm. Side rails up x 2, fall assessment and education completed with patient/family. Patient/family able to verbalize understanding of risk associated with falls and verbalized understanding to call for assistance before getting out of bed. Call light within reach. Patient/family able to voice and demonstrate understanding of unit orientation instructions.    Will continue to evaluate and treat per MD orders.  Sue LushKaelin Romesberg RN, BSN

## 2015-12-13 DIAGNOSIS — E1143 Type 2 diabetes mellitus with diabetic autonomic (poly)neuropathy: Secondary | ICD-10-CM | POA: Diagnosis not present

## 2015-12-13 DIAGNOSIS — R1110 Vomiting, unspecified: Secondary | ICD-10-CM | POA: Diagnosis not present

## 2015-12-13 DIAGNOSIS — R1310 Dysphagia, unspecified: Secondary | ICD-10-CM | POA: Diagnosis not present

## 2015-12-13 DIAGNOSIS — E876 Hypokalemia: Secondary | ICD-10-CM | POA: Diagnosis not present

## 2015-12-13 LAB — MAGNESIUM: MAGNESIUM: 2.2 mg/dL (ref 1.7–2.4)

## 2015-12-13 LAB — CBC
HEMATOCRIT: 40.9 % (ref 39.0–52.0)
Hemoglobin: 13.1 g/dL (ref 13.0–17.0)
MCH: 27.3 pg (ref 26.0–34.0)
MCHC: 32 g/dL (ref 30.0–36.0)
MCV: 85.4 fL (ref 78.0–100.0)
Platelets: 251 10*3/uL (ref 150–400)
RBC: 4.79 MIL/uL (ref 4.22–5.81)
RDW: 14.6 % (ref 11.5–15.5)
WBC: 8.6 10*3/uL (ref 4.0–10.5)

## 2015-12-13 LAB — BASIC METABOLIC PANEL
ANION GAP: 6 (ref 5–15)
BUN: 6 mg/dL (ref 6–20)
CHLORIDE: 106 mmol/L (ref 101–111)
CO2: 28 mmol/L (ref 22–32)
CREATININE: 1.8 mg/dL — AB (ref 0.61–1.24)
Calcium: 8.3 mg/dL — ABNORMAL LOW (ref 8.9–10.3)
GFR calc non Af Amer: 43 mL/min — ABNORMAL LOW (ref >60)
GFR, EST AFRICAN AMERICAN: 50 mL/min — AB (ref >60)
Glucose, Bld: 143 mg/dL — ABNORMAL HIGH (ref 65–99)
POTASSIUM: 3.1 mmol/L — AB (ref 3.5–5.1)
SODIUM: 140 mmol/L (ref 135–145)

## 2015-12-13 LAB — GLUCOSE, CAPILLARY: Glucose-Capillary: 162 mg/dL — ABNORMAL HIGH (ref 65–99)

## 2015-12-13 LAB — HEMOGLOBIN A1C
Hgb A1c MFr Bld: 9.4 % — ABNORMAL HIGH (ref 4.8–5.6)
MEAN PLASMA GLUCOSE: 223 mg/dL

## 2015-12-13 MED ORDER — PANTOPRAZOLE SODIUM 40 MG PO TBEC: 40.0000 mg | DELAYED_RELEASE_TABLET | Freq: Every day | ORAL | Status: DC

## 2015-12-13 MED ORDER — PROMETHAZINE HCL 12.5 MG PO TABS: 12.5000 mg | ORAL_TABLET | Freq: Four times a day (QID) | ORAL | Status: DC | PRN

## 2015-12-13 MED ORDER — INSULIN DETEMIR 100 UNIT/ML ~~LOC~~ SOLN: 80.0000 [IU] | Freq: Every evening | SUBCUTANEOUS | Status: DC

## 2015-12-13 MED ORDER — POTASSIUM CHLORIDE 20 MEQ/15ML (10%) PO SOLN
40.0000 meq | Freq: Once | ORAL | Status: AC
  Administered 2015-12-13: 40 meq via ORAL
  Filled 2015-12-13: qty 30

## 2015-12-13 MED ORDER — POTASSIUM CHLORIDE 20 MEQ PO PACK: 20.0000 meq | PACK | Freq: Every day | ORAL | Status: DC

## 2015-12-13 MED ORDER — FUROSEMIDE 40 MG PO TABS: 40.0000 mg | ORAL_TABLET | Freq: Every day | ORAL | Status: DC

## 2015-12-13 MED ORDER — POTASSIUM CHLORIDE CRYS ER 20 MEQ PO TBCR
40.0000 meq | EXTENDED_RELEASE_TABLET | Freq: Once | ORAL | Status: DC
  Filled 2015-12-13: qty 2

## 2015-12-13 NOTE — Discharge Summary (Signed)
Physician Discharge Summary   Patient ID: Kenneth Fox MRN: [id number] DOB/AGE: [birth year] 45 y.o.  Admit date: 12/11/2015 Discharge date: 12/13/2015  Primary Care Physician:  Kenneth Fox  Discharge Diagnoses:    . Intractable nausea and vomiting . Diabetic gastroparesis (HCC) . Hypokalemia . Obstructive sleep apnea    Hypokalemia    Chronic diastolic CHF   Uncontrolled diabetes mellitus insulin-dependent  Consults: None  Recommendations for Outpatient Follow-up:  1. Please repeat CBC/BMET at next visit 2. Lasix was placed on hold until PCP/cardiology follow-up 3. Patient was given the perception for Klor-Con solution, unable to tolerate pills (per patient triggers gastroparesis)   DIET: Carb modified diet    Allergies:   Allergies  Allergen Reactions  . Angiotensin Receptor Blockers Other (See Comments)    Acute renal failure in patient w R heart failure  . Lisinopril Other (See Comments)    Patient developed AKI after being on lisinopril for a week.     DISCHARGE MEDICATIONS: Discharge Medication List as of 12/13/2015 11:10 AM    CONTINUE these medications which have CHANGED   Details  furosemide (LASIX) 40 MG tablet Take 1 tablet (40 mg total) by mouth daily. HOLD LASIX UNTIL FOLLOW-UP WITH YOUR CARDIOLOGIST, Starting 12/15/2015, Until Discontinued, No Print    insulin detemir (LEVEMIR) 100 UNIT/ML injection Inject 0.8 mLs (80 Units total) into the skin every evening., Starting 12/13/2015, Until Discontinued, Print    metFORMIN (GLUCOPHAGE) 500 MG tablet Take 1 tablet (500 mg total) by mouth 3 (three) times daily., Starting 12/13/2015, Until Discontinued, No Print    pantoprazole (PROTONIX) 40 MG tablet Take 1 tablet (40 mg total) by mouth daily., Starting 12/13/2015, Until Discontinued, Print    potassium chloride (KLOR-CON) 20 MEQ packet Take 20 mEq by mouth daily., Starting 12/13/2015, Until Discontinued, Print    promethazine (PHENERGAN) 12.5 MG tablet Take 1  tablet (12.5 mg total) by mouth every 6 (six) hours as needed for nausea or vomiting., Starting 12/13/2015, Until Discontinued, Print      CONTINUE these medications which have NOT CHANGED   Details  amLODipine (NORVASC) 5 MG tablet Take 1 tablet (5 mg total) by mouth daily., Starting 06/06/2015, Until Discontinued, Print    diazepam (VALIUM) 1 MG/ML solution Take 5 mLs (5 mg total) by mouth every 8 (eight) hours as needed (nausea/vomiting)., Starting 12/09/2015, Until Discontinued, Print    dicyclomine (BENTYL) 20 MG tablet Take 1 tablet (20 mg total) by mouth 2 (two) times daily., Starting 05/29/2015, Until Discontinued, Print    hydrALAZINE (APRESOLINE) 50 MG tablet Take 1 tablet (50 mg total) by mouth 3 (three) times daily., Starting 06/06/2015, Until Discontinued, Print    insulin aspart (NOVOLOG) 100 UNIT/ML injection Inject 10-20 Units into the skin 3 (three) times daily with meals. per sliding scale CBG 70 - 120: 0 units CBG 121 - 150: 2 units CBG 151 - 200: 3 units CBG 201 - 250: 5 units CBG 251 - 300: 8 units CBG 301 - 350: 11 units CBG 351 - 400: 15 units,  Starting 06/06/2015, Until Discontinued, Print    LYRICA 100 MG capsule Take 100 mg by mouth 3 (three) times daily., Starting 05/02/2015, Until Discontinued, Historical Med    metoCLOPramide (REGLAN) 10 MG tablet Take 1 tablet (10 mg total) by mouth every 8 (eight) hours as needed for nausea or vomiting., Starting 12/06/2015, Until Discontinued, Print    metoprolol succinate (TOPROL-XL) 50 MG 24 hr tablet Take 50 mg by mouth daily., Starting 10/18/2015,  Until Discontinued, Historical Med    Oxycodone HCl 10 MG TABS Take 1 tablet by mouth every 6 (six) hours as needed (pain). , Starting 04/17/2014, Until Discontinued, Historical Med    traMADol (ULTRAM) 50 MG tablet Take 1 tablet (50 mg total) by mouth every 6 (six) hours as needed., Starting 07/29/2015, Until Discontinued, Print      STOP taking these medications      potassium chloride SA (K-DUR,KLOR-CON) 20 MEQ tablet          Brief H and P: For complete details please refer to admission H and P, but in brief Kenneth Fox is a 45 y.o. male with medical history significant of IDDM with complications neuropathy and gastroparesis, HTN, oxygen dependent, CKD stage III, OSA, and GERD; presents with complaints of stomach pain, nausea, and vomiting. Symptoms have been intermittent over the last 2 weeks. Pain is generalized across his whole abdomen. He states that he's had difficulty in taking any of his oral medications as it feels like they are getting stuck in the center of his chest. He reports this burning queasy sensation that is across his whole abdomen. Patient's wife present at bedside reported that over the last 4 days he's not been able to keep much of his food or liquids down. He reported taking Reglan without much relief of symptoms. Seen in the ED 2 days ago with similar complaints, but was able to tolerate oral medications and was able to be discharged home. He has been somewhat noncompliant with insulin as well  Hospital Course:   Intractable nausea and vomiting/ history of gastroparesis: Acute on chronic. Resolved - CT abdomen and pelvis was negative for any SBO or acute abdominal pathology. LFT's WNL. - At the time of admission, patient had reported intractable nausea, vomiting and abdominal pain.  - Esophagogram showed no stricture or mass or any obstruction in the esophagus. - Patient is not tolerating solids without any difficulty. It also appears that potassium pills triggered nausea and hence patient was recommended potassium solution rather than pills. Recommended GI consultation outpatient if worsening gastroparesis.  - Nutrition consult was obtained for guidance on diet with gastroparesis history  Hypokalemia, hypomagnesemia: Potassium 2.8  - Patient received IV and oral potassium replacement, magnesium IV replacement. Patient had  difficulty with oral potassium pills which triggered his nausea every time. He was able to take potassium solution.  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 Patient with uncontrolled blood glucose readings at home. last hemoglobin A1c on file noted to be 11.6 in 05/2015.  - Follow outpatient with PCP, Levemir 80 units daily, metformin  Lactic acidosis Likely due to dehydration, metformin. Metformin was held during hospitalization  Chronic kidney disease stage III: Patient's baseline creatinine ranges from 1.4-2. On presentation patient's creatinine was improved from 2.172 days ago to 1.63. - Currently at baseline.  Essential hypertension - Continue amlodipine, hydralazine   Obstructive sleep apnea: While sleeping patient seen to have O2 saturations dropped into the 60-70s on not on the CPAP mask. - RT to supply CPAP  GERD - protonix  Chronic diastolic CHF - No JVD, currently compensated - 2-D echo in 12/16 had shown EF of 55-60% with no regional wall motion abnormalities, pulmonary hypertension. - Patient had been taking Lasix 40 mg twice a day, currently somewhat dehydrated, decrease Lasix to 40 mg once a day to resume after assessed by cardiology, follow-up with Dr. Jacinto Halim scheduled.   Day of Discharge BP  138/88 mmHg  Pulse 77  Temp(Src) 98 F (36.7 C) (Oral)  Resp 19  Ht 5\' 11"  (1.803 m)  Wt 143.337 kg (316 lb)  BMI 44.09 kg/m2  SpO2 97%  Physical Exam: General: Alert and awake oriented x3 not in any acute distress. HEENT: anicteric sclera, pupils reactive to light and accommodation CVS: S1-S2 clear no murmur rubs or gallops Chest: clear to auscultation bilaterally, no wheezing rales or rhonchi Abdomen: Obese, soft nontender, nondistended, normal bowel sounds Extremities: no cyanosis, clubbing or edema noted bilaterally Neuro: Cranial nerves II-XII intact, no focal neurological deficits   The results of  significant diagnostics from this hospitalization (including imaging, microbiology, ancillary and laboratory) are listed below for reference.    LAB RESULTS: Basic Metabolic Panel:  Recent Labs Lab 12/12/15 0601 12/12/15 1453 12/13/15 0455  NA 144  --  140  K 2.8* 3.5 3.1*  CL 105  --  106  CO2 32  --  28  GLUCOSE 113*  --  143*  BUN <5*  --  6  CREATININE 1.46*  --  1.80*  CALCIUM 8.4*  --  8.3*  MG 1.7  --  2.2   Liver Function Tests:  Recent Labs Lab 12/09/15 1517 12/11/15 1419  AST 19 16  ALT 16* 14*  ALKPHOS 88 90  BILITOT 0.5 0.6  PROT 6.6 6.6  ALBUMIN 2.7* 2.6*    Recent Labs Lab 12/09/15 1517 12/11/15 1419  LIPASE 20 28   No results for input(s): AMMONIA in the last 168 hours. CBC:  Recent Labs Lab 12/11/15 1419 12/12/15 0601 12/13/15 0455  WBC 9.1 8.1 8.6  NEUTROABS 7.2  --   --   HGB 13.6 13.1 13.1  HCT 41.8 41.1 40.9  MCV 83.1 85.3 85.4  PLT 287 266 251   Cardiac Enzymes: No results for input(s): CKTOTAL, CKMB, CKMBINDEX, TROPONINI in the last 168 hours. BNP: Invalid input(s): POCBNP CBG:  Recent Labs Lab 12/12/15 2143 12/13/15 0804  GLUCAP 162* 162*    Significant Diagnostic Studies:  Ct Abdomen Pelvis W Contrast  12/11/2015  ADDENDUM REPORT: 12/11/2015 18:48 ADDENDUM: Small umbilical hernia measures 3 cm (image 113, series 6). No change from 03/10/2014. Well-circumscribed lytic lesion in the LEFT iliac bone appears benign. Electronically Signed   By: Genevive BiStewart  Edmunds M.D.   On: 12/11/2015 18:48  12/11/2015  CLINICAL DATA:  Abdominal pain, nausea and vomiting. EXAM: CT ABDOMEN AND PELVIS WITH CONTRAST TECHNIQUE: Multidetector CT imaging of the abdomen and pelvis was performed using the standard protocol following bolus administration of intravenous contrast. CONTRAST:  1 ISOVUE-300 IOPAMIDOL (ISOVUE-300) INJECTION 61% COMPARISON:  CT 03/10/2014 FINDINGS: Lower chest: There is a band of atelectasis in the RIGHT middle lobe and linear  atelectasis in the LEFT RIGHT lower lobe. No pleural fluid or airspace disease identified. Hepatobiliary: No focal hepatic lesion. The gallbladder is normal. No biliary duct dilatation. Pancreas: Pancreas is normal. No ductal dilatation. No pancreatic inflammation. Spleen: Normal spleen Adrenals/urinary tract: Adrenal glands and kidneys are normal. The ureters and bladder normal. Stomach/Bowel: Stomach, small bowel, appendix, and cecum are normal. The colon and rectosigmoid colon are normal. Vascular/Lymphatic: Abdominal aorta is normal caliber. There is no retroperitoneal or periportal lymphadenopathy. No pelvic lymphadenopathy. Reproductive: Prostate normal. Other: No free-fluid.  No ventral hernia or inguinal hernia. Musculoskeletal: Internal fixation of RIGHT hemipelvis. No acute findings. IMPRESSION: 1. Bibasilar atelectasis. 2. Normal appendix and gallbladder. 3. No ureteral obstruction. Electronically Signed: By: Genevive BiStewart  Edmunds M.D. On: 12/11/2015 18:42  Dg Esophagus  12/12/2015  CLINICAL DATA:  Complaining of meds sticking in esophagus. EXAM: ESOPHOGRAM/BARIUM SWALLOW TECHNIQUE: Single contrast examination was performed using  thin barium. FLUOROSCOPY TIME:  Radiation Exposure Index (as provided by the fluoroscopic device): 44.4 mGy COMPARISON:  CT abdomen 12/11/2015 FINDINGS: Limited exam due to patient immobility and somewhat sedated state. There is no stricture or mass within the esophagus. Normal esophageal motility. Barium tab was not administered due to patient's semi upright positioning. IMPRESSION: No stricture, mass or obstruction within the esophagus on single contrast exam. Electronically Signed   By: Genevive Bi M.D.   On: 12/12/2015 15:49    2D ECHO:   Disposition and Follow-up: Discharge Instructions    (HEART FAILURE PATIENTS) Call MD:  Anytime you have any of the following symptoms: 1) 3 pound weight gain in 24 hours or 5 pounds in 1 week 2) shortness of breath, with or  without a dry hacking cough 3) swelling in the hands, feet or stomach 4) if you have to sleep on extra pillows at night in order to breathe.    Complete by:  As directed      Diet Carb Modified    Complete by:  As directed      Discharge instructions    Complete by:  As directed   Please hold lasix until your follow-up with primary physician. Recheck labs for potassium, kidney function in 7 days     Discharge instructions    Complete by:  As directed   Please HOLD lasix for next 5 days or until follow-up with your cardiologist  It is VERY IMPORTANT that you follow up with a PCP on a regular basis.  Check your blood glucoses before each meal and at bedtime and maintain a log of your readings.  Bring this log with you when you follow up with your PCP so that he or she can adjust your insulin at your follow up visit.     Increase activity slowly    Complete by:  As directed             DISPOSITION: home    DISCHARGE FOLLOW-UP Follow-up Information    Follow up with LONG,[name], Fox. Schedule an appointment as soon as possible for a visit in 2 weeks.   Specialty:  Physician Assistant   Why:  Doctor office did not answer phone Give them a call later on today or in the A.M.    Contact information:   [address] [place] [address] [phone]       Follow up with Yates Decamp, MD On 12/31/2015.   Specialty:  Cardiology   Why:  for hospital follow-up   Appointment is on 12/31/15 at [facility] information:   [address] [place] [address] [phone]        Time spent on Discharge:   Signed:   RAI,RIPUDEEP M.D. [facility] 12/13/2015, 12:32 PM Pager: [phone]

## 2015-12-13 NOTE — Care Management Note (Signed)
Case Management Note  Patient Details  Name: Kenneth Fox E Avallone MRN: [id number] Date of Birth: March 28, 1971  Subjective/Objective:                    Action/Plan: Pt discharge to  home with resumption of home health services ( RN,PT).  Expected Discharge Date:   12/13/2015          Expected Discharge Plan:  Home w Home Health Services  In-House Referral:     Discharge planning Services  CM Consult  Post Acute Care Choice:  Resumption of Svcs/PTA Provider Choice offered to:     DME Arranged:    DME Agency:     HH Arranged:  RN, PT (resumption of HH THERAPY (RN,PT)) [facility]   [facility] [phone]  Status of Service:  Completed, signed off  If discussed at Long Length of Stay Meetings, dates discussed:    Additional Comments:  Epifanio LeschesCole, Kerah Hardebeck Hudson, [place]RN,BSN,CM [phone] 12/13/2015, 12:12 PM

## 2015-12-13 NOTE — Progress Notes (Signed)
NURSING PROGRESS NOTE  Kenneth Fox [id number] Discharge Data: 12/13/2015 11:36 AM Attending Provider: Cathren Harshipudeep K Rai, MD WUJ:WJXB,JYNWGPCP:LONG,[name], PA-C     Kenneth Fox to be D/C'd Home per MD order.  Discussed with the patient the After Visit Summary and all questions fully answered. All IV's discontinued with no bleeding noted. All belongings returned to patient for patient to take home.   Last Vital Signs:  Blood pressure 138/88, pulse 77, temperature 98 F (36.7 C), temperature source Oral, resp. rate 19, height 5\' 11"  (1.803 m), weight 143.337 kg (316 lb), SpO2 97 %.  Discharge Medication List   Medication List    STOP taking these medications        potassium chloride SA 20 MEQ tablet  Commonly known as:  K-DUR,KLOR-CON      TAKE these medications        amLODipine 5 MG tablet  Commonly known as:  NORVASC  Take 1 tablet (5 mg total) by mouth daily.     diazepam 1 MG/ML solution  Commonly known as:  VALIUM  Take 5 mLs (5 mg total) by mouth every 8 (eight) hours as needed (nausea/vomiting).     dicyclomine 20 MG tablet  Commonly known as:  BENTYL  Take 1 tablet (20 mg total) by mouth 2 (two) times daily.     furosemide 40 MG tablet  Commonly known as:  LASIX  Take 1 tablet (40 mg total) by mouth daily. HOLD LASIX UNTIL FOLLOW-UP WITH YOUR CARDIOLOGIST  Start taking on:  12/15/2015     hydrALAZINE 50 MG tablet  Commonly known as:  APRESOLINE  Take 1 tablet (50 mg total) by mouth 3 (three) times daily.     insulin aspart 100 UNIT/ML injection  Commonly known as:  novoLOG  Inject 10-20 Units into the skin 3 (three) times daily with meals. per sliding scale CBG 70 - 120: 0 units CBG 121 - 150: 2 units CBG 151 - 200: 3 units CBG 201 - 250: 5 units CBG 251 - 300: 8 units CBG 301 - 350: 11 units CBG 351 - 400: 15 units     insulin detemir 100 UNIT/ML injection  Commonly known as:  LEVEMIR  Inject 0.8 mLs (80 Units total) into the skin every evening.     LYRICA 100 MG  capsule  Generic drug:  pregabalin  Take 100 mg by mouth 3 (three) times daily.     metFORMIN 500 MG tablet  Commonly known as:  GLUCOPHAGE  Take 1 tablet (500 mg total) by mouth 3 (three) times daily.     metoCLOPramide 10 MG tablet  Commonly known as:  REGLAN  Take 1 tablet (10 mg total) by mouth every 8 (eight) hours as needed for nausea or vomiting.     metoprolol succinate 50 MG 24 hr tablet  Commonly known as:  TOPROL-XL  Take 50 mg by mouth daily.     Oxycodone HCl 10 MG Tabs  Take 1 tablet by mouth every 6 (six) hours as needed (pain).     pantoprazole 40 MG tablet  Commonly known as:  PROTONIX  Take 1 tablet (40 mg total) by mouth daily.     potassium chloride 20 MEQ packet  Commonly known as:  KLOR-CON  Take 20 mEq by mouth daily.     promethazine 12.5 MG tablet  Commonly known as:  PHENERGAN  Take 1 tablet (12.5 mg total) by mouth every 6 (six) hours as needed for nausea or vomiting.  traMADol 50 MG tablet  Commonly known as:  ULTRAM  Take 1 tablet (50 mg total) by mouth every 6 (six) hours as needed.

## 2016-02-20 ENCOUNTER — Emergency Department (HOSPITAL_COMMUNITY): Payer: Medicare Other

## 2016-02-20 ENCOUNTER — Emergency Department (HOSPITAL_COMMUNITY)
Admission: EM | Admit: 2016-02-20 | Discharge: 2016-02-20 | Disposition: A | Discharge status: Home / Self Care | Payer: Medicare Other | Attending: Physician Assistant | Admitting: Physician Assistant

## 2016-02-20 ENCOUNTER — Encounter (HOSPITAL_COMMUNITY): Payer: Self-pay | Admitting: *Deleted

## 2016-02-20 DIAGNOSIS — R519 Headache, unspecified: Secondary | ICD-10-CM

## 2016-02-20 DIAGNOSIS — I110 Hypertensive heart disease with heart failure: Secondary | ICD-10-CM | POA: Insufficient documentation

## 2016-02-20 DIAGNOSIS — Z7984 Long term (current) use of oral hypoglycemic drugs: Secondary | ICD-10-CM | POA: Diagnosis not present

## 2016-02-20 DIAGNOSIS — Z79899 Other long term (current) drug therapy: Secondary | ICD-10-CM | POA: Insufficient documentation

## 2016-02-20 DIAGNOSIS — E1143 Type 2 diabetes mellitus with diabetic autonomic (poly)neuropathy: Secondary | ICD-10-CM | POA: Insufficient documentation

## 2016-02-20 DIAGNOSIS — R51 Headache: Secondary | ICD-10-CM | POA: Diagnosis present

## 2016-02-20 DIAGNOSIS — R030 Elevated blood-pressure reading, without diagnosis of hypertension: Secondary | ICD-10-CM

## 2016-02-20 DIAGNOSIS — R0602 Shortness of breath: Secondary | ICD-10-CM | POA: Insufficient documentation

## 2016-02-20 DIAGNOSIS — J45909 Unspecified asthma, uncomplicated: Secondary | ICD-10-CM | POA: Diagnosis not present

## 2016-02-20 DIAGNOSIS — I509 Heart failure, unspecified: Secondary | ICD-10-CM | POA: Diagnosis not present

## 2016-02-20 DIAGNOSIS — IMO0001 Reserved for inherently not codable concepts without codable children: Secondary | ICD-10-CM

## 2016-02-20 DIAGNOSIS — Z794 Long term (current) use of insulin: Secondary | ICD-10-CM | POA: Insufficient documentation

## 2016-02-20 HISTORY — DX: Heart failure, unspecified: I50.9

## 2016-02-20 LAB — CBC
HCT: 36.8 % — ABNORMAL LOW (ref 39.0–52.0)
Hemoglobin: 12 g/dL — ABNORMAL LOW (ref 13.0–17.0)
MCH: 26.7 pg (ref 26.0–34.0)
MCHC: 32.6 g/dL (ref 30.0–36.0)
MCV: 81.8 fL (ref 78.0–100.0)
Platelets: 236 10*3/uL (ref 150–400)
RBC: 4.5 MIL/uL (ref 4.22–5.81)
RDW: 15.3 % (ref 11.5–15.5)
WBC: 7.1 10*3/uL (ref 4.0–10.5)

## 2016-02-20 LAB — BASIC METABOLIC PANEL WITH GFR
Anion gap: 6 (ref 5–15)
BUN: 14 mg/dL (ref 6–20)
CO2: 25 mmol/L (ref 22–32)
Calcium: 8.6 mg/dL — ABNORMAL LOW (ref 8.9–10.3)
Chloride: 107 mmol/L (ref 101–111)
Creatinine, Ser: 1.74 mg/dL — ABNORMAL HIGH (ref 0.61–1.24)
GFR calc Af Amer: 52 mL/min — ABNORMAL LOW
GFR calc non Af Amer: 45 mL/min — ABNORMAL LOW
Glucose, Bld: 244 mg/dL — ABNORMAL HIGH (ref 65–99)
Potassium: 3.6 mmol/L (ref 3.5–5.1)
Sodium: 138 mmol/L (ref 135–145)

## 2016-02-20 LAB — BRAIN NATRIURETIC PEPTIDE: B Natriuretic Peptide: 20 pg/mL (ref 0.0–100.0)

## 2016-02-20 LAB — I-STAT TROPONIN, ED: Troponin i, poc: 0 ng/mL (ref 0.00–0.08)

## 2016-02-20 LAB — CBG MONITORING, ED: Glucose-Capillary: 245 mg/dL — ABNORMAL HIGH (ref 65–99)

## 2016-02-20 MED ORDER — BUTALBITAL-APAP-CAFFEINE 50-325-40 MG PO TABS
1.0000 | ORAL_TABLET | Freq: Once | ORAL | Status: AC
  Administered 2016-02-20: 1 via ORAL
  Filled 2016-02-20: qty 1

## 2016-02-20 MED ORDER — HYDRALAZINE HCL 25 MG PO TABS
50.0000 mg | ORAL_TABLET | Freq: Once | ORAL | Status: AC
  Administered 2016-02-20: 50 mg via ORAL
  Filled 2016-02-20: qty 2

## 2016-02-20 MED ORDER — AMLODIPINE BESYLATE 5 MG PO TABS: 5.0000 mg | ORAL_TABLET | Freq: Every day | ORAL | 0 refills | Status: DC

## 2016-02-20 NOTE — ED Notes (Signed)
Pt in xray

## 2016-02-20 NOTE — ED Notes (Signed)
Placed patient into a gown on the monitor 

## 2016-02-20 NOTE — ED Provider Notes (Signed)
[facility]-EMERGENCY DEPT Provider Note   CSN: [id number] Arrival date & time: 02/20/16  1531     History   Chief Complaint Chief Complaint  Patient presents with  . Hypertension  . Headache  . Shortness of Breath  . Leg Swelling    HPI Kenneth Fox is a 45 y.o. male.  HPI   45 year old male with history of IDDM, HTN, morbid obesity, mild CHF Presenting with complaints of headache. Patient states he developed gradual onset of headache ongoing since this morning. Headache is described as a throbbing sensation to his forehead, persistent, felt like hunger pain that has since improved. He also endorsed having running nose sneezing coughing for the past 2 days. Complaining of mild increased shortness of breath and fluid gains his leg for the past several weeks. He is on almost and at 2 L. This morning during his accident episode check his blood pressure was 157 systolic which concerns him. He did take his blood pressure medication. He also tried eating when the headache seems to respond. He does not normally have headaches. He denies having fever, diplopia, neck stiffness, neck pain, chest pain, focal numbness or weakness.  Past Medical History:  Diagnosis Date  . Asthma   . CHF (congestive heart failure) (HCC)   . Diabetes (HCC)   . Gastroparesis   . GERD (gastroesophageal reflux disease)   . Hypertension   . Kidney disorder   . Neuropathy (HCC)   . Obesity   . Obstructive sleep apnea    will not wear CPAP  . Renal insufficiency   . Sickle cell trait [facility](HCC)     Patient Active Problem List   Diagnosis Date Noted  . Dysphagia 12/12/2015  . Intractable nausea and vomiting 12/11/2015  . Leukocytosis 06/02/2015  . Morbid obesity due to excess calories (HCC) 06/02/2015  . Uncontrolled type 2 diabetes mellitus with diabetic neuropathy, with long-term current use of insulin (HCC)   . Elevated troponin 06/01/2015  . AKI (acute kidney injury) (HCC) 12/03/2013  . Adverse reaction to  lisinopril 12/03/2013  . Hypokalemia 11/25/2013  . Diabetic gastroparesis (HCC) 11/17/2013  . Obstructive sleep apnea 11/07/2012  . Essential hypertension 05/18/2011    Past Surgical History:  Procedure Laterality Date  . HIP FRACTURE SURGERY Right 1999   "put a plate in"       Home Medications    Prior to Admission medications   Medication Sig Start Date End Date Taking? Authorizing Provider  amLODipine (NORVASC) 5 MG tablet Take 1 tablet (5 mg total) by mouth daily. 06/06/15   Alison MurrayAlma M Devine, MD  diazepam (VALIUM) 1 MG/ML solution Take 5 mLs (5 mg total) by mouth every 8 (eight) hours as needed (nausea/vomiting). 12/09/15   Trixie DredgeEmily West, PA-C  dicyclomine (BENTYL) 20 MG tablet Take 1 tablet (20 mg total) by mouth 2 (two) times daily. 05/29/15   Joycie PeekBenjamin Cartner, PA-C  furosemide (LASIX) 40 MG tablet Take 1 tablet (40 mg total) by mouth daily. HOLD LASIX UNTIL FOLLOW-UP WITH YOUR CARDIOLOGIST 12/15/15   Ripudeep Jenna LuoK Rai, MD  hydrALAZINE (APRESOLINE) 50 MG tablet Take 1 tablet (50 mg total) by mouth 3 (three) times daily. 06/06/15   Alison MurrayAlma M Devine, MD  insulin aspart (NOVOLOG) 100 UNIT/ML injection Inject 10-20 Units into the skin 3 (three) times daily with meals. per sliding scale CBG 70 - 120: 0 units CBG 121 - 150: 2 units CBG 151 - 200: 3 units CBG 201 - 250: 5 units CBG 251 - 300:  8 units CBG 301 - 350: 11 units CBG 351 - 400: 15 units 06/06/15   Alison Murray, MD  insulin detemir (LEVEMIR) 100 UNIT/ML injection Inject 0.8 mLs (80 Units total) into the skin every evening. 12/13/15   Ripudeep Jenna Luo, MD  LYRICA 100 MG capsule Take 100 mg by mouth 3 (three) times daily. 05/02/15   Historical Provider, MD  metFORMIN (GLUCOPHAGE) 500 MG tablet Take 1 tablet (500 mg total) by mouth 3 (three) times daily. 12/13/15   Ripudeep Jenna Luo, MD  metoCLOPramide (REGLAN) 10 MG tablet Take 1 tablet (10 mg total) by mouth every 8 (eight) hours as needed for nausea or vomiting. 12/06/15   Trixie Dredge, PA-C    metoprolol succinate (TOPROL-XL) 50 MG 24 hr tablet Take 50 mg by mouth daily. 10/18/15   Historical Provider, MD  Oxycodone HCl 10 MG TABS Take 1 tablet by mouth every 6 (six) hours as needed (pain).  04/17/14   Historical Provider, MD  pantoprazole (PROTONIX) 40 MG tablet Take 1 tablet (40 mg total) by mouth daily. 12/13/15   Ripudeep Jenna Luo, MD  potassium chloride (KLOR-CON) 20 MEQ packet Take 20 mEq by mouth daily. 12/13/15   Ripudeep Jenna Luo, MD  promethazine (PHENERGAN) 12.5 MG tablet Take 1 tablet (12.5 mg total) by mouth every 6 (six) hours as needed for nausea or vomiting. 12/13/15   Ripudeep Jenna Luo, MD  traMADol (ULTRAM) 50 MG tablet Take 1 tablet (50 mg total) by mouth every 6 (six) hours as needed. Patient taking differently: Take 50 mg by mouth every 6 (six) hours as needed for moderate pain.  07/29/15   Derwood Kaplan, MD    Family History Family History  Problem Relation Age of Onset  . Sickle cell anemia Mother   . Heart attack Mother   . Diabetes Father   . Neuropathy Father   . Hypertension Father   . Aneurysm Father   . Kidney disease Brother   . Kidney disease Sister     Social History Social History  Substance Use Topics  . Smoking status: Never Smoker  . Smokeless tobacco: Never Used  . Alcohol use No     Allergies   Angiotensin receptor blockers and Lisinopril   Review of Systems Review of Systems  All other systems reviewed and are negative.    Physical Exam Updated Vital Signs BP 147/84   Pulse 89   Temp 98.9 F (37.2 C) (Oral)   Resp 18   SpO2 95%   Physical Exam  Constitutional: He is oriented to person, place, and time. He appears well-developed and well-nourished. No distress.  Moderately obese male, sitting upright appears to be in mild respiratory discomfort but nontoxic.  HENT:  Head: Atraumatic.  Right Ear: External ear normal.  Left Ear: External ear normal.  Nose: Nose normal.  Mouth/Throat: Oropharynx is clear and moist.  Eyes:  Conjunctivae and EOM are normal. Pupils are equal, round, and reactive to light.  Neck: Normal range of motion. Neck supple.  No nuchal rigidity  Cardiovascular: Normal rate, regular rhythm and intact distal pulses.   Pulmonary/Chest: Breath sounds normal. He is in respiratory distress (Mildly tachypneic). He has no wheezes. He has no rales.  Abdominal: Soft. He exhibits distension. There is no tenderness.  Musculoskeletal: He exhibits edema (2+ pitting edema to bilateral lower extent is without palpable cords or erythema).  Neurological: He is alert and oriented to person, place, and time. He has normal strength. He is not  disoriented. No cranial nerve deficit or sensory deficit. He displays a negative Romberg sign. GCS eye subscore is 4. GCS verbal subscore is 5. GCS motor subscore is 6.  Skin: No rash noted.  Psychiatric: He has a normal mood and affect.  Nursing note and vitals reviewed.    ED Treatments / Results  Labs (all labs ordered are listed, but only abnormal results are displayed) Labs Reviewed  CBG MONITORING, ED - Abnormal; Notable for the following:       Result Value   Glucose-Capillary 245 (*)    All other components within normal limits  BASIC METABOLIC PANEL  CBC  I-STAT TROPOININ, ED    EKG  EKG Interpretation None       Radiology No results found.  Procedures Procedures (including critical care time)  Medications Ordered in ED Medications - No data to display   Initial Impression / Assessment and Plan / ED Course  I have reviewed the triage vital signs and the nursing notes.  Pertinent labs & imaging results that were available during my care of the patient were reviewed by me and considered in my medical decision making (see chart for details).  Clinical Course    BP 161/91   Pulse 74   Temp 98.9 F (37.2 C) (Oral)   Resp 18   SpO2 96%    Final Clinical Impressions(s) / ED Diagnoses   Final diagnoses:  Elevated blood pressure  Bad  headache    New Prescriptions Current Discharge Medication List     5:19 PM Patient presents with headache and was voicing concern for high blood pressure. Headache is probably in his forehead. Headache has since subsided and she does not request for any pain medication at this time. No red flags. Has history of CHF currently taking Lasix. He is complaining of fluid gain for the past several days. His chest x-ray shows no signs of pleural effusion. He does have 2+ pedal edema to bilateral lower extremities. He has no focal neuro deficit on exam. Workup initiated.  7:42 PM BP in the 170s systolic.  Pt report he ran out of one of his BP medication, amlodipine.  Head CT without acute finding.  Labs are reassuring.  Headache improves with treatment in the ER.  Pt agrees to f/u with PCP for further management of his sxs.  Will refill his BP.  Return precaution discussed.  Doubt SAH, dissection or hypertensive emergency.  Care discussed with Dr. Corlis Leak.   Fayrene Helper, PA-C 02/20/16 1944    Courteney Randall An, MD 02/20/16 2351

## 2016-02-20 NOTE — Discharge Instructions (Signed)
Your headache may be related to elevated blood pressure.  Please take your blood pressure medication as prescribed. Follow up with your doctor for further management of your condition. Return if you have any concerns.

## 2016-02-20 NOTE — ED Triage Notes (Signed)
PT is here with htn, headaches, sob, swelling, and has history of chf.  Pt states head has been hurting real bad since this am.

## 2016-05-04 ENCOUNTER — Emergency Department (HOSPITAL_COMMUNITY)
Admission: EM | Admit: 2016-05-04 | Discharge: 2016-05-04 | Disposition: A | Discharge status: Home / Self Care | Payer: Medicare Other | Attending: Emergency Medicine | Admitting: Emergency Medicine

## 2016-05-04 ENCOUNTER — Encounter (HOSPITAL_COMMUNITY): Payer: Self-pay | Admitting: Emergency Medicine

## 2016-05-04 DIAGNOSIS — R1013 Epigastric pain: Secondary | ICD-10-CM | POA: Diagnosis present

## 2016-05-04 DIAGNOSIS — Z79899 Other long term (current) drug therapy: Secondary | ICD-10-CM | POA: Insufficient documentation

## 2016-05-04 DIAGNOSIS — Z794 Long term (current) use of insulin: Secondary | ICD-10-CM | POA: Diagnosis not present

## 2016-05-04 DIAGNOSIS — I110 Hypertensive heart disease with heart failure: Secondary | ICD-10-CM | POA: Insufficient documentation

## 2016-05-04 DIAGNOSIS — K3184 Gastroparesis: Secondary | ICD-10-CM

## 2016-05-04 DIAGNOSIS — E1143 Type 2 diabetes mellitus with diabetic autonomic (poly)neuropathy: Secondary | ICD-10-CM | POA: Insufficient documentation

## 2016-05-04 DIAGNOSIS — I509 Heart failure, unspecified: Secondary | ICD-10-CM | POA: Insufficient documentation

## 2016-05-04 DIAGNOSIS — J45909 Unspecified asthma, uncomplicated: Secondary | ICD-10-CM | POA: Diagnosis not present

## 2016-05-04 DIAGNOSIS — E1140 Type 2 diabetes mellitus with diabetic neuropathy, unspecified: Secondary | ICD-10-CM | POA: Diagnosis not present

## 2016-05-04 LAB — COMPREHENSIVE METABOLIC PANEL
ALK PHOS: 100 U/L (ref 38–126)
ALT: 13 U/L — AB (ref 17–63)
ANION GAP: 9 (ref 5–15)
AST: 19 U/L (ref 15–41)
Albumin: 3.4 g/dL — ABNORMAL LOW (ref 3.5–5.0)
BUN: 13 mg/dL (ref 6–20)
CALCIUM: 9 mg/dL (ref 8.9–10.3)
CO2: 24 mmol/L (ref 22–32)
CREATININE: 1.51 mg/dL — AB (ref 0.61–1.24)
Chloride: 106 mmol/L (ref 101–111)
GFR, EST NON AFRICAN AMERICAN: 53 mL/min — AB (ref >60)
Glucose, Bld: 260 mg/dL — ABNORMAL HIGH (ref 65–99)
Potassium: 3.8 mmol/L (ref 3.5–5.1)
SODIUM: 139 mmol/L (ref 135–145)
TOTAL PROTEIN: 7.7 g/dL (ref 6.5–8.1)
Total Bilirubin: 0.8 mg/dL (ref 0.3–1.2)

## 2016-05-04 LAB — URINALYSIS, ROUTINE W REFLEX MICROSCOPIC
Bilirubin Urine: NEGATIVE
Glucose, UA: 500 mg/dL — AB
Ketones, ur: NEGATIVE mg/dL
LEUKOCYTES UA: NEGATIVE
NITRITE: NEGATIVE
SPECIFIC GRAVITY, URINE: 1.013 (ref 1.005–1.030)
pH: 7.5 (ref 5.0–8.0)

## 2016-05-04 LAB — CBC
HCT: 41.2 % (ref 39.0–52.0)
HEMOGLOBIN: 14.3 g/dL (ref 13.0–17.0)
MCH: 28 pg (ref 26.0–34.0)
MCHC: 34.7 g/dL (ref 30.0–36.0)
MCV: 80.8 fL (ref 78.0–100.0)
PLATELETS: 261 10*3/uL (ref 150–400)
RBC: 5.1 MIL/uL (ref 4.22–5.81)
RDW: 14.4 % (ref 11.5–15.5)
WBC: 10.9 10*3/uL — AB (ref 4.0–10.5)

## 2016-05-04 LAB — URINE MICROSCOPIC-ADD ON

## 2016-05-04 LAB — CBG MONITORING, ED: GLUCOSE-CAPILLARY: 244 mg/dL — AB (ref 65–99)

## 2016-05-04 LAB — LIPASE, BLOOD: Lipase: 47 U/L (ref 11–51)

## 2016-05-04 MED ORDER — HALOPERIDOL LACTATE 5 MG/ML IJ SOLN
5.0000 mg | Freq: Once | INTRAMUSCULAR | Status: AC
  Administered 2016-05-04: 5 mg via INTRAVENOUS
  Filled 2016-05-04: qty 1

## 2016-05-04 MED ORDER — SODIUM CHLORIDE 0.9 % IV BOLUS (SEPSIS)
1000.0000 mL | Freq: Once | INTRAVENOUS | Status: AC
  Administered 2016-05-04: 1000 mL via INTRAVENOUS

## 2016-05-04 NOTE — ED Notes (Signed)
Unable to collect blood patient will not get up off the floor.  I ask patient if he want to lay in the chair and the wife stated she has been through this for 7 years and he will not lay in a chair.

## 2016-05-04 NOTE — ED Triage Notes (Signed)
Pt presents via EMS after having epigastric pain since 8am this morning. N/V/D. Alert and oriented.

## 2016-05-04 NOTE — ED Provider Notes (Signed)
[facility]-EMERGENCY DEPT Provider Note   CSN: [id number] Arrival date & time: 05/04/16  1824     History   Chief Complaint Chief Complaint  Patient presents with  . Abdominal Pain    HPI Kenneth Fox is a 45 y.o. male.  The history is provided by the patient.  Abdominal Pain   This is a chronic problem. The current episode started more than 1 week ago (multiple years with worsening today). The problem occurs constantly. The problem has not changed since onset.The pain is associated with eating. The pain is located in the epigastric region. The quality of the pain is aching. The pain is severe. Associated symptoms include anorexia, nausea and vomiting. Nothing aggravates the symptoms. Nothing relieves the symptoms. Past medical history comments: gastroparesis.    Past Medical History:  Diagnosis Date  . Asthma   . CHF (congestive heart failure) (HCC)   . Diabetes (HCC)   . Gastroparesis   . GERD (gastroesophageal reflux disease)   . Hypertension   . Kidney disorder   . Neuropathy (HCC)   . Obesity   . Obstructive sleep apnea    will not wear CPAP  . Renal insufficiency   . Sickle cell trait [facility])     Patient Active Problem List   Diagnosis Date Noted  . Dysphagia 12/12/2015  . Intractable nausea and vomiting 12/11/2015  . Leukocytosis 06/02/2015  . Morbid obesity due to excess calories (HCC) 06/02/2015  . Uncontrolled type 2 diabetes mellitus with diabetic neuropathy, with long-term current use of insulin (HCC)   . Elevated troponin 06/01/2015  . AKI (acute kidney injury) (HCC) 12/03/2013  . Adverse reaction to lisinopril 12/03/2013  . Hypokalemia 11/25/2013  . Diabetic gastroparesis (HCC) 11/17/2013  . Obstructive sleep apnea 11/07/2012  . Essential hypertension 05/18/2011    Past Surgical History:  Procedure Laterality Date  . HIP FRACTURE SURGERY Right 1999   "put a plate in"       Home Medications    Prior to Admission medications   Medication  Sig Start Date End Date Taking? Authorizing Provider  amLODipine (NORVASC) 5 MG tablet Take 1 tablet (5 mg total) by mouth daily. 02/20/16   Fayrene Helper, PA-C  diazepam (VALIUM) 1 MG/ML solution Take 5 mLs (5 mg total) by mouth every 8 (eight) hours as needed (nausea/vomiting). 12/09/15   Trixie Dredge, PA-C  dicyclomine (BENTYL) 20 MG tablet Take 1 tablet (20 mg total) by mouth 2 (two) times daily. 05/29/15   Joycie Peek, PA-C  furosemide (LASIX) 40 MG tablet Take 1 tablet (40 mg total) by mouth daily. HOLD LASIX UNTIL FOLLOW-UP WITH YOUR CARDIOLOGIST 12/15/15   Ripudeep Jenna Luo, MD  hydrALAZINE (APRESOLINE) 50 MG tablet Take 1 tablet (50 mg total) by mouth 3 (three) times daily. 06/06/15   Alison Murray, MD  insulin aspart (NOVOLOG) 100 UNIT/ML injection Inject 10-20 Units into the skin 3 (three) times daily with meals. per sliding scale CBG 70 - 120: 0 units CBG 121 - 150: 2 units CBG 151 - 200: 3 units CBG 201 - 250: 5 units CBG 251 - 300: 8 units CBG 301 - 350: 11 units CBG 351 - 400: 15 units 06/06/15   Alison Murray, MD  insulin detemir (LEVEMIR) 100 UNIT/ML injection Inject 0.8 mLs (80 Units total) into the skin every evening. 12/13/15   Ripudeep Jenna Luo, MD  LYRICA 100 MG capsule Take 100 mg by mouth 3 (three) times daily. 05/02/15   Historical Provider, MD  metFORMIN (GLUCOPHAGE) 500 MG tablet Take 1 tablet (500 mg total) by mouth 3 (three) times daily. 12/13/15   Ripudeep Jenna LuoK Rai, MD  metoCLOPramide (REGLAN) 10 MG tablet Take 1 tablet (10 mg total) by mouth every 8 (eight) hours as needed for nausea or vomiting. 12/06/15   Trixie DredgeEmily West, PA-C  metoprolol succinate (TOPROL-XL) 50 MG 24 hr tablet Take 50 mg by mouth daily. 10/18/15   Historical Provider, MD  Oxycodone HCl 10 MG TABS Take 1 tablet by mouth every 6 (six) hours as needed (pain).  04/17/14   Historical Provider, MD  pantoprazole (PROTONIX) 40 MG tablet Take 1 tablet (40 mg total) by mouth daily. 12/13/15   Ripudeep Jenna LuoK Rai, MD  potassium chloride  (KLOR-CON) 20 MEQ packet Take 20 mEq by mouth daily. 12/13/15   Ripudeep Jenna LuoK Rai, MD  promethazine (PHENERGAN) 12.5 MG tablet Take 1 tablet (12.5 mg total) by mouth every 6 (six) hours as needed for nausea or vomiting. 12/13/15   Ripudeep Jenna LuoK Rai, MD  traMADol (ULTRAM) 50 MG tablet Take 1 tablet (50 mg total) by mouth every 6 (six) hours as needed. Patient taking differently: Take 50 mg by mouth every 6 (six) hours as needed for moderate pain.  07/29/15   Derwood KaplanAnkit Nanavati, MD    Family History Family History  Problem Relation Age of Onset  . Sickle cell anemia Mother   . Heart attack Mother   . Diabetes Father   . Neuropathy Father   . Hypertension Father   . Aneurysm Father   . Kidney disease Brother   . Kidney disease Sister     Social History Social History  Substance Use Topics  . Smoking status: Never Smoker  . Smokeless tobacco: Never Used  . Alcohol use No     Allergies   Angiotensin receptor blockers and Lisinopril   Review of Systems Review of Systems  Gastrointestinal: Positive for abdominal pain, anorexia, nausea and vomiting.  All other systems reviewed and are negative.    Physical Exam Updated Vital Signs BP 194/99 (BP Location: Left Arm)   Pulse 88   Temp 97.9 F (36.6 C) (Oral)   Resp 22   Ht 5\' 11"  (1.803 m)   Wt (!) 330 lb (149.7 kg)   SpO2 98%   BMI 46.03 kg/m   Physical Exam  Constitutional: He is oriented to person, place, and time. He appears well-developed and well-nourished. No distress.  HENT:  Head: Normocephalic and atraumatic.  Nose: Nose normal.  Eyes: Conjunctivae are normal.  Neck: Neck supple. No tracheal deviation present.  Cardiovascular: Normal rate and regular rhythm.   Pulmonary/Chest: Effort normal. No respiratory distress.  Abdominal: Soft. He exhibits no distension. There is tenderness in the epigastric area. There is no rigidity, no rebound, no guarding and negative Murphy's sign.  Neurological: He is alert and oriented to  person, place, and time.  Skin: Skin is warm and dry.  Psychiatric: His mood appears anxious (and moaning).     ED Treatments / Results  Labs (all labs ordered are listed, but only abnormal results are displayed) Labs Reviewed  COMPREHENSIVE METABOLIC PANEL - Abnormal; Notable for the following:       Result Value   Glucose, Bld 260 (*)    Creatinine, Ser 1.51 (*)    Albumin 3.4 (*)    ALT 13 (*)    GFR calc non Af Amer 53 (*)    All other components within normal limits  CBC - Abnormal; Notable  for the following:    WBC 10.9 (*)    All other components within normal limits  CBG MONITORING, ED - Abnormal; Notable for the following:    Glucose-Capillary 244 (*)    All other components within normal limits  LIPASE, BLOOD  URINALYSIS, ROUTINE W REFLEX MICROSCOPIC (NOT AT [facility])    EKG  EKG Interpretation None       Radiology No results found.  Procedures Procedures (including critical care time)  Medications Ordered in ED Medications  haloperidol lactate (HALDOL) injection 5 mg (5 mg Intravenous Given 05/04/16 2111)  sodium chloride 0.9 % bolus 1,000 mL (0 mLs Intravenous Stopped 05/04/16 2210)     Initial Impression / Assessment and Plan / ED Course  I have reviewed the triage vital signs and the nursing notes.  Pertinent labs & imaging results that were available during my care of the patient were reviewed by me and considered in my medical decision making (see chart for details).  Clinical Course     45 y.o. male presents with recurrent diabetic gastroparesis symptoms which are poorly controlled today. Labs reassuring and exam not c/w surgical etiology. No imaging indicated emergently. Suspect this is a flare of a chronic condition. Provided IV fluids and IV haldol, Pt no longer retching and feels better, able to sleep during ED stay. Plan to follow up with PCP as needed and return precautions discussed for worsening or new concerning symptoms.   Final  Clinical Impressions(s) / ED Diagnoses   Final diagnoses:  Diabetic gastroparesis [facility](HCC)    New Prescriptions New Prescriptions   No medications on file     Lyndal Pulleyaniel Asante Blanda, MD 05/05/16 0144

## 2016-05-04 NOTE — ED Notes (Addendum)
Urine sample unattained.  Pt had already used the restroom before I could get them a specimen cup.

## 2016-05-05 ENCOUNTER — Encounter (HOSPITAL_COMMUNITY): Payer: Self-pay | Admitting: Emergency Medicine

## 2016-05-06 ENCOUNTER — Emergency Department (HOSPITAL_COMMUNITY)
Admission: EM | Admit: 2016-05-06 | Discharge: 2016-05-06 | Disposition: A | Discharge status: Home / Self Care | Payer: Medicare Other | Source: Home / Self Care | Attending: Emergency Medicine | Admitting: Emergency Medicine

## 2016-05-06 ENCOUNTER — Encounter (HOSPITAL_COMMUNITY): Payer: Self-pay

## 2016-05-06 DIAGNOSIS — I509 Heart failure, unspecified: Secondary | ICD-10-CM

## 2016-05-06 DIAGNOSIS — I110 Hypertensive heart disease with heart failure: Secondary | ICD-10-CM | POA: Insufficient documentation

## 2016-05-06 DIAGNOSIS — E860 Dehydration: Secondary | ICD-10-CM

## 2016-05-06 DIAGNOSIS — Z79899 Other long term (current) drug therapy: Secondary | ICD-10-CM

## 2016-05-06 DIAGNOSIS — E1140 Type 2 diabetes mellitus with diabetic neuropathy, unspecified: Secondary | ICD-10-CM

## 2016-05-06 DIAGNOSIS — Z794 Long term (current) use of insulin: Secondary | ICD-10-CM

## 2016-05-06 DIAGNOSIS — K3184 Gastroparesis: Secondary | ICD-10-CM

## 2016-05-06 DIAGNOSIS — E1143 Type 2 diabetes mellitus with diabetic autonomic (poly)neuropathy: Secondary | ICD-10-CM

## 2016-05-06 DIAGNOSIS — R748 Abnormal levels of other serum enzymes: Secondary | ICD-10-CM

## 2016-05-06 LAB — CBC WITH DIFFERENTIAL/PLATELET
Basophils Absolute: 0 10*3/uL (ref 0.0–0.1)
Basophils Relative: 0 %
EOS PCT: 0 %
Eosinophils Absolute: 0 10*3/uL (ref 0.0–0.7)
HCT: 41.3 % (ref 39.0–52.0)
Hemoglobin: 13.9 g/dL (ref 13.0–17.0)
LYMPHS ABS: 0.9 10*3/uL (ref 0.7–4.0)
LYMPHS PCT: 7 %
MCH: 27.8 pg (ref 26.0–34.0)
MCHC: 33.7 g/dL (ref 30.0–36.0)
MCV: 82.6 fL (ref 78.0–100.0)
MONO ABS: 0.6 10*3/uL (ref 0.1–1.0)
Monocytes Relative: 4 %
Neutro Abs: 12.2 10*3/uL — ABNORMAL HIGH (ref 1.7–7.7)
Neutrophils Relative %: 89 %
PLATELETS: 275 10*3/uL (ref 150–400)
RBC: 5 MIL/uL (ref 4.22–5.81)
RDW: 14.9 % (ref 11.5–15.5)
WBC: 13.7 10*3/uL — ABNORMAL HIGH (ref 4.0–10.5)

## 2016-05-06 LAB — I-STAT CHEM 8, ED
BUN: 16 mg/dL (ref 6–20)
CHLORIDE: 101 mmol/L (ref 101–111)
CREATININE: 1.8 mg/dL — AB (ref 0.61–1.24)
Calcium, Ion: 1.1 mmol/L — ABNORMAL LOW (ref 1.15–1.40)
GLUCOSE: 350 mg/dL — AB (ref 65–99)
HCT: 46 % (ref 39.0–52.0)
Hemoglobin: 15.6 g/dL (ref 13.0–17.0)
POTASSIUM: 3.6 mmol/L (ref 3.5–5.1)
Sodium: 140 mmol/L (ref 135–145)
TCO2: 26 mmol/L (ref 0–100)

## 2016-05-06 LAB — URINE MICROSCOPIC-ADD ON

## 2016-05-06 LAB — URINALYSIS, ROUTINE W REFLEX MICROSCOPIC
Bilirubin Urine: NEGATIVE
Ketones, ur: 15 mg/dL — AB
Leukocytes, UA: NEGATIVE
Nitrite: NEGATIVE
Specific Gravity, Urine: 1.014 (ref 1.005–1.030)
pH: 6.5 (ref 5.0–8.0)

## 2016-05-06 LAB — CBG MONITORING, ED: Glucose-Capillary: 352 mg/dL — ABNORMAL HIGH (ref 65–99)

## 2016-05-06 LAB — MAGNESIUM: Magnesium: 2.1 mg/dL (ref 1.7–2.4)

## 2016-05-06 MED ORDER — HYDRALAZINE HCL 50 MG PO TABS
50.0000 mg | ORAL_TABLET | Freq: Three times a day (TID) | ORAL | Status: DC
  Administered 2016-05-06: 50 mg via ORAL
  Filled 2016-05-06 (×2): qty 1

## 2016-05-06 MED ORDER — GI COCKTAIL ~~LOC~~
30.0000 mL | Freq: Once | ORAL | Status: AC
Start: 2016-05-06 — End: 2016-05-06
  Administered 2016-05-06: 30 mL via ORAL
  Filled 2016-05-06: qty 30

## 2016-05-06 MED ORDER — HALOPERIDOL LACTATE 5 MG/ML IJ SOLN
5.0000 mg | Freq: Once | INTRAMUSCULAR | Status: AC
  Administered 2016-05-06: 5 mg via INTRAVENOUS
  Filled 2016-05-06: qty 1

## 2016-05-06 MED ORDER — FENTANYL CITRATE (PF) 100 MCG/2ML IJ SOLN
50.0000 ug | Freq: Once | INTRAMUSCULAR | Status: AC
  Administered 2016-05-06: 50 ug via INTRAVENOUS
  Filled 2016-05-06: qty 2

## 2016-05-06 MED ORDER — SODIUM CHLORIDE 0.9 % IV BOLUS (SEPSIS)
1000.0000 mL | Freq: Once | INTRAVENOUS | Status: AC
  Administered 2016-05-06: 1000 mL via INTRAVENOUS

## 2016-05-06 MED ORDER — AMLODIPINE BESYLATE 5 MG PO TABS
5.0000 mg | ORAL_TABLET | Freq: Every day | ORAL | Status: DC
Start: 2016-05-06 — End: 2016-05-06
  Administered 2016-05-06: 5 mg via ORAL
  Filled 2016-05-06: qty 1

## 2016-05-06 MED ORDER — METOPROLOL SUCCINATE ER 50 MG PO TB24
50.0000 mg | ORAL_TABLET | Freq: Every day | ORAL | Status: DC
  Administered 2016-05-06: 50 mg via ORAL
  Filled 2016-05-06: qty 1

## 2016-05-06 MED ORDER — METOCLOPRAMIDE HCL 5 MG/ML IJ SOLN
10.0000 mg | Freq: Once | INTRAMUSCULAR | Status: AC
  Administered 2016-05-06: 10 mg via INTRAVENOUS
  Filled 2016-05-06: qty 2

## 2016-05-06 MED ORDER — SODIUM CHLORIDE 0.9 % IV SOLN: INTRAVENOUS | Status: DC

## 2016-05-06 MED ORDER — FUROSEMIDE 40 MG PO TABS
40.0000 mg | ORAL_TABLET | Freq: Every day | ORAL | Status: DC
  Administered 2016-05-06: 40 mg via ORAL
  Filled 2016-05-06: qty 1

## 2016-05-06 NOTE — ED Triage Notes (Signed)
He reports persistent abd. Pain with n/v. Seen here recently for same.

## 2016-05-06 NOTE — ED Provider Notes (Addendum)
[facility]-EMERGENCY DEPT Provider Note   CSN: [id number] Arrival date & time: 05/06/16  0845     History   Chief Complaint Chief Complaint  Patient presents with  . Abdominal Pain  . Emesis    HPI Kenneth Fox is a 45 y.o. male.  HPI Pt comes in with cc of n/v/abd pain. Hx of gastroparesis and DM. Pt has been having his symptoms x 3 days. Pt has had emesis x 10+ in the last 24 hours. Pt has abd pain in the epigastrium and also has had 4 loose BM. Pt has hx of gastroparesis, and thinks the symptoms are related. Pt doesn't think he are anything suspicious.  Past Medical History:  Diagnosis Date  . Asthma   . CHF (congestive heart failure) (HCC)   . Diabetes (HCC)   . Gastroparesis   . GERD (gastroesophageal reflux disease)   . Hypertension   . Kidney disorder   . Neuropathy (HCC)   . Obesity   . Obstructive sleep apnea    will not wear CPAP  . Renal insufficiency   . Sickle cell trait [facility])     Patient Active Problem List   Diagnosis Date Noted  . Dysphagia 12/12/2015  . Intractable nausea and vomiting 12/11/2015  . Leukocytosis 06/02/2015  . Morbid obesity due to excess calories (HCC) 06/02/2015  . Uncontrolled type 2 diabetes mellitus with diabetic neuropathy, with long-term current use of insulin (HCC)   . Elevated troponin 06/01/2015  . AKI (acute kidney injury) (HCC) 12/03/2013  . Adverse reaction to lisinopril 12/03/2013  . Hypokalemia 11/25/2013  . Diabetic gastroparesis (HCC) 11/17/2013  . Obstructive sleep apnea 11/07/2012  . Essential hypertension 05/18/2011    Past Surgical History:  Procedure Laterality Date  . HIP FRACTURE SURGERY Right 1999   "put a plate in"       Home Medications    Prior to Admission medications   Medication Sig Start Date End Date Taking? Authorizing Provider  amLODipine (NORVASC) 10 MG tablet Take 10 mg by mouth daily. 04/27/16  Yes Historical Provider, MD  dicyclomine (BENTYL) 20 MG tablet Take 1 tablet (20 mg  total) by mouth 2 (two) times daily. 05/29/15  Yes Benjamin Cartner, PA-C  furosemide (LASIX) 40 MG tablet Take 1 tablet (40 mg total) by mouth daily. HOLD LASIX UNTIL FOLLOW-UP WITH YOUR CARDIOLOGIST 12/15/15  Yes Ripudeep Jenna Luo, MD  hydrALAZINE (APRESOLINE) 50 MG tablet Take 1 tablet (50 mg total) by mouth 3 (three) times daily. 06/06/15  Yes Alison Murray, MD  insulin aspart (NOVOLOG) 100 UNIT/ML injection Inject 10-20 Units into the skin 3 (three) times daily with meals. per sliding scale CBG 70 - 120: 0 units CBG 121 - 150: 2 units CBG 151 - 200: 3 units CBG 201 - 250: 5 units CBG 251 - 300: 8 units CBG 301 - 350: 11 units CBG 351 - 400: 15 units 06/06/15  Yes Alison Murray, MD  insulin detemir (LEVEMIR) 100 UNIT/ML injection Inject 0.8 mLs (80 Units total) into the skin every evening. 12/13/15  Yes Ripudeep Jenna Luo, MD  lidocaine (XYLOCAINE) 5 % ointment Apply 1 application topically 2 (two) times daily as needed for pain. 04/15/16  Yes Historical Provider, MD  LYRICA 100 MG capsule Take 100 mg by mouth 3 (three) times daily. 05/02/15  Yes Historical Provider, MD  metFORMIN (GLUCOPHAGE) 500 MG tablet Take 1 tablet (500 mg total) by mouth 3 (three) times daily. 12/13/15  Yes Ripudeep Jenna Luo, MD  metoprolol succinate (TOPROL-XL) 50 MG 24 hr tablet Take 50 mg by mouth daily. 10/18/15  Yes Historical Provider, MD  Oxycodone HCl 10 MG TABS Take 1 tablet by mouth every 6 (six) hours as needed (pain).  04/17/14  Yes Historical Provider, MD  polyethylene glycol powder (GLYCOLAX/MIRALAX) powder Take 17 g by mouth daily as needed for constipation. 02/19/16  Yes Historical Provider, MD  potassium chloride (KLOR-CON) 20 MEQ packet Take 20 mEq by mouth daily. 12/13/15  Yes Ripudeep Jenna LuoK Rai, MD  promethazine (PHENERGAN) 12.5 MG tablet Take 1 tablet (12.5 mg total) by mouth every 6 (six) hours as needed for nausea or vomiting. 12/13/15  Yes Ripudeep Jenna LuoK Rai, MD  diazepam (VALIUM) 1 MG/ML solution Take 5 mLs (5 mg total) by  mouth every 8 (eight) hours as needed (nausea/vomiting). Patient not taking: Reported on 05/06/2016 12/09/15   Trixie DredgeEmily West, PA-C  metoCLOPramide (REGLAN) 10 MG tablet Take 1 tablet (10 mg total) by mouth every 8 (eight) hours as needed for nausea or vomiting. Patient not taking: Reported on 05/06/2016 12/06/15   Trixie DredgeEmily West, PA-C  pantoprazole (PROTONIX) 40 MG tablet Take 1 tablet (40 mg total) by mouth daily. Patient not taking: Reported on 05/06/2016 12/13/15   Ripudeep Jenna LuoK Rai, MD    Family History Family History  Problem Relation Age of Onset  . Sickle cell anemia Mother   . Heart attack Mother   . Diabetes Father   . Neuropathy Father   . Hypertension Father   . Aneurysm Father   . Kidney disease Brother   . Kidney disease Sister     Social History Social History  Substance Use Topics  . Smoking status: Never Smoker  . Smokeless tobacco: Never Used  . Alcohol use No     Allergies   Angiotensin receptor blockers and Lisinopril   Review of Systems Review of Systems  ROS 10 Systems reviewed and are negative for acute change except as noted in the HPI.     Physical Exam Updated Vital Signs BP (!) 192/109   Pulse 103   Temp 98.3 F (36.8 C) (Oral)   Resp 14   SpO2 96%   Physical Exam  Constitutional: He is oriented to person, place, and time. He appears well-developed.  HENT:  Head: Normocephalic and atraumatic.  Mucus membrane is dry  Eyes: Conjunctivae and EOM are normal. Pupils are equal, round, and reactive to light.  Neck: Normal range of motion. Neck supple.  Cardiovascular: Normal rate and regular rhythm.   Pulmonary/Chest: Effort normal and breath sounds normal.  Abdominal: Soft. Bowel sounds are normal. He exhibits no distension. There is tenderness. There is no rebound and no guarding.  Epigastric tenderness  Neurological: He is alert and oriented to person, place, and time.  Skin: Skin is warm.  Nursing note and vitals reviewed.    ED Treatments  / Results  Labs (all labs ordered are listed, but only abnormal results are displayed) Labs Reviewed  CBC WITH DIFFERENTIAL/PLATELET - Abnormal; Notable for the following:       Result Value   WBC 13.7 (*)    Neutro Abs 12.2 (*)    All other components within normal limits  URINALYSIS, ROUTINE W REFLEX MICROSCOPIC (NOT AT [facility]) - Abnormal; Notable for the following:    Glucose, UA >1000 (*)    Hgb urine dipstick MODERATE (*)    Ketones, ur 15 (*)    Protein, ur >300 (*)    All other components within normal  limits  URINE MICROSCOPIC-ADD ON - Abnormal; Notable for the following:    Squamous Epithelial / LPF 6-30 (*)    Bacteria, UA RARE (*)    Casts HYALINE CASTS (*)    All other components within normal limits  I-STAT CHEM 8, ED - Abnormal; Notable for the following:    Creatinine, Ser 1.80 (*)    Glucose, Bld 350 (*)    Calcium, Ion 1.10 (*)    All other components within normal limits  CBG MONITORING, ED - Abnormal; Notable for the following:    Glucose-Capillary 352 (*)    All other components within normal limits  MAGNESIUM    EKG  EKG Interpretation  Date/Time:  Wednesday May 06 2016 10:18:37 EST Ventricular Rate:  104 PR Interval:    QRS Duration: 96 QT Interval:  358 QTC Calculation: 471 R Axis:   117 Text Interpretation:  Sinus tachycardia Right axis deviation Low voltage, precordial leads Probable anteroseptal infarct, old No acute changes Confirmed by Rhunette CroftNANAVATI, MD, Janey GentaANKIT [phone]([id number]) on 05/06/2016 11:40:29 AM       Radiology No results found.  Procedures Procedures (including critical care time)  Medications Ordered in ED Medications  sodium chloride 0.9 % bolus 1,000 mL (0 mLs Intravenous Stopped 05/06/16 1302)    And  0.9 %  sodium chloride infusion (not administered)  amLODipine (NORVASC) tablet 5 mg (5 mg Oral Given 05/06/16 1240)  furosemide (LASIX) tablet 40 mg (40 mg Oral Given 05/06/16 1240)  hydrALAZINE (APRESOLINE) tablet 50 mg (50 mg  Oral Given 05/06/16 1240)  metoprolol succinate (TOPROL-XL) 24 hr tablet 50 mg (50 mg Oral Given 05/06/16 1240)  metoCLOPramide (REGLAN) injection 10 mg (10 mg Intravenous Given 05/06/16 1022)  gi cocktail (Maalox,Lidocaine,Donnatal) (30 mLs Oral Given 05/06/16 1023)  fentaNYL (SUBLIMAZE) injection 50 mcg (50 mcg Intravenous Given 05/06/16 1023)  haloperidol lactate (HALDOL) injection 5 mg (5 mg Intravenous Given 05/06/16 1240)     Initial Impression / Assessment and Plan / ED Course  I have reviewed the triage vital signs and the nursing notes.  Pertinent labs & imaging results that were available during my care of the patient were reviewed by me and considered in my medical decision making (see chart for details).  Clinical Course as of May 07 1315  Wed May 06, 2016  1208 Patient reassessed. Pt is comfortable at this time.  Results of the workup discussed. Strict ER return precautions discussed. Follow up instruction discussed, and pt agrees with the plan and is comfortable with it.  His BP is high. He has missed his home meds, so we will give him his oral home meds. That will also serve as additional po challenge. Anticipate d/c.  [AN]  1311 PO challenge passed, stable for d./c.  [AN]    Clinical Course User Index [AN] Derwood KaplanAnkit Marcele Kosta, MD    Pt comes in with cc of ad pain/n/v/diarrhea. He reports that all of these sx are present with gastroparesis. We will get basic labs to check for Cr and lytes. Also give hydration. We will give meds for gastroparesis.   Final Clinical Impressions(s) / ED Diagnoses   Final diagnoses:  Gastroparesis  Dehydration  Elevated creatine kinase    New Prescriptions New Prescriptions   No medications on file     Derwood KaplanAnkit Rutger Salton, MD 05/06/16 1209    Derwood KaplanAnkit Bambi Fehnel, MD 05/06/16 1316

## 2016-05-06 NOTE — ED Notes (Signed)
He now has drank two glasses of water with no emesis.

## 2016-05-06 NOTE — Discharge Instructions (Signed)
You had a gastroparesis flair. Symptoms better in the ER. Liquid diet for the next 2-3 days. Push fluids. See your doctor in 1 week for repeat electrolytes - as your kidney enzyme was slightly bumped.  Please return to the ER if your symptoms worsen; you have increased pain, fevers, chills, inability to keep any medications down, confusion. Otherwise see the outpatient doctor as requested.

## 2016-05-06 NOTE — ED Notes (Signed)
Bed: WA17 Expected date:  Expected time:  Means of arrival:  Comments: EMS gastroparesis

## 2016-05-08 ENCOUNTER — Encounter (HOSPITAL_COMMUNITY): Payer: Self-pay | Admitting: Emergency Medicine

## 2016-05-08 DIAGNOSIS — E860 Dehydration: Secondary | ICD-10-CM | POA: Diagnosis present

## 2016-05-08 DIAGNOSIS — N179 Acute kidney failure, unspecified: Principal | ICD-10-CM | POA: Diagnosis present

## 2016-05-08 DIAGNOSIS — K219 Gastro-esophageal reflux disease without esophagitis: Secondary | ICD-10-CM | POA: Diagnosis present

## 2016-05-08 DIAGNOSIS — E1143 Type 2 diabetes mellitus with diabetic autonomic (poly)neuropathy: Secondary | ICD-10-CM | POA: Diagnosis present

## 2016-05-08 DIAGNOSIS — E1122 Type 2 diabetes mellitus with diabetic chronic kidney disease: Secondary | ICD-10-CM | POA: Diagnosis present

## 2016-05-08 DIAGNOSIS — D573 Sickle-cell trait: Secondary | ICD-10-CM | POA: Diagnosis present

## 2016-05-08 DIAGNOSIS — Z79899 Other long term (current) drug therapy: Secondary | ICD-10-CM

## 2016-05-08 DIAGNOSIS — I509 Heart failure, unspecified: Secondary | ICD-10-CM | POA: Diagnosis present

## 2016-05-08 DIAGNOSIS — Z841 Family history of disorders of kidney and ureter: Secondary | ICD-10-CM

## 2016-05-08 DIAGNOSIS — Z6841 Body Mass Index (BMI) 40.0 and over, adult: Secondary | ICD-10-CM

## 2016-05-08 DIAGNOSIS — Z8249 Family history of ischemic heart disease and other diseases of the circulatory system: Secondary | ICD-10-CM

## 2016-05-08 DIAGNOSIS — Z794 Long term (current) use of insulin: Secondary | ICD-10-CM

## 2016-05-08 DIAGNOSIS — N183 Chronic kidney disease, stage 3 (moderate): Secondary | ICD-10-CM | POA: Diagnosis present

## 2016-05-08 DIAGNOSIS — K3184 Gastroparesis: Secondary | ICD-10-CM | POA: Diagnosis present

## 2016-05-08 DIAGNOSIS — Z833 Family history of diabetes mellitus: Secondary | ICD-10-CM

## 2016-05-08 DIAGNOSIS — J45909 Unspecified asthma, uncomplicated: Secondary | ICD-10-CM | POA: Diagnosis present

## 2016-05-08 DIAGNOSIS — G4733 Obstructive sleep apnea (adult) (pediatric): Secondary | ICD-10-CM | POA: Diagnosis present

## 2016-05-08 DIAGNOSIS — E1165 Type 2 diabetes mellitus with hyperglycemia: Secondary | ICD-10-CM | POA: Diagnosis present

## 2016-05-08 DIAGNOSIS — I130 Hypertensive heart and chronic kidney disease with heart failure and stage 1 through stage 4 chronic kidney disease, or unspecified chronic kidney disease: Secondary | ICD-10-CM | POA: Diagnosis present

## 2016-05-08 LAB — BASIC METABOLIC PANEL
Anion gap: 11 (ref 5–15)
BUN: 32 mg/dL — AB (ref 6–20)
CO2: 25 mmol/L (ref 22–32)
Calcium: 8.5 mg/dL — ABNORMAL LOW (ref 8.9–10.3)
Chloride: 98 mmol/L — ABNORMAL LOW (ref 101–111)
Creatinine, Ser: 4.58 mg/dL — ABNORMAL HIGH (ref 0.61–1.24)
GFR calc Af Amer: 16 mL/min — ABNORMAL LOW (ref >60)
GFR, EST NON AFRICAN AMERICAN: 14 mL/min — AB (ref >60)
GLUCOSE: 268 mg/dL — AB (ref 65–99)
POTASSIUM: 3.3 mmol/L — AB (ref 3.5–5.1)
Sodium: 134 mmol/L — ABNORMAL LOW (ref 135–145)

## 2016-05-08 LAB — CBC
HEMATOCRIT: 40 % (ref 39.0–52.0)
Hemoglobin: 13.5 g/dL (ref 13.0–17.0)
MCH: 27.7 pg (ref 26.0–34.0)
MCHC: 33.8 g/dL (ref 30.0–36.0)
MCV: 82.1 fL (ref 78.0–100.0)
Platelets: 226 10*3/uL (ref 150–400)
RBC: 4.87 MIL/uL (ref 4.22–5.81)
RDW: 14.4 % (ref 11.5–15.5)
WBC: 9.8 10*3/uL (ref 4.0–10.5)

## 2016-05-08 LAB — I-STAT TROPONIN, ED: Troponin i, poc: 0 ng/mL (ref 0.00–0.08)

## 2016-05-08 NOTE — ED Triage Notes (Signed)
Pt states "my feet are swollen like grapes. I cant feel my legs". Pt states he cant feel his legs since this morning. Pt states he cannot walk. Pt hx of diabetes, htn, heart problems. Denies CP or sob. Pt ate some extra salt in his food today.

## 2016-05-08 NOTE — ED Notes (Signed)
BMET and BNP in process per lab

## 2016-05-09 ENCOUNTER — Encounter (HOSPITAL_COMMUNITY): Payer: Self-pay | Admitting: *Deleted

## 2016-05-09 ENCOUNTER — Inpatient Hospital Stay (HOSPITAL_COMMUNITY)
Admission: EM | Admit: 2016-05-09 | Discharge: 2016-05-11 | DRG: 683 | Disposition: A | Discharge status: Skilled Nursing Facility | Payer: Medicare Other | Attending: Family Medicine | Admitting: Family Medicine

## 2016-05-09 ENCOUNTER — Inpatient Hospital Stay (HOSPITAL_COMMUNITY): Payer: Medicare Other

## 2016-05-09 DIAGNOSIS — K3184 Gastroparesis: Secondary | ICD-10-CM | POA: Diagnosis present

## 2016-05-09 DIAGNOSIS — Z79899 Other long term (current) drug therapy: Secondary | ICD-10-CM | POA: Diagnosis not present

## 2016-05-09 DIAGNOSIS — Z8249 Family history of ischemic heart disease and other diseases of the circulatory system: Secondary | ICD-10-CM | POA: Diagnosis not present

## 2016-05-09 DIAGNOSIS — Z794 Long term (current) use of insulin: Secondary | ICD-10-CM | POA: Diagnosis not present

## 2016-05-09 DIAGNOSIS — E1143 Type 2 diabetes mellitus with diabetic autonomic (poly)neuropathy: Secondary | ICD-10-CM | POA: Diagnosis present

## 2016-05-09 DIAGNOSIS — G4733 Obstructive sleep apnea (adult) (pediatric): Secondary | ICD-10-CM | POA: Diagnosis present

## 2016-05-09 DIAGNOSIS — I130 Hypertensive heart and chronic kidney disease with heart failure and stage 1 through stage 4 chronic kidney disease, or unspecified chronic kidney disease: Secondary | ICD-10-CM | POA: Diagnosis present

## 2016-05-09 DIAGNOSIS — Z833 Family history of diabetes mellitus: Secondary | ICD-10-CM | POA: Diagnosis not present

## 2016-05-09 DIAGNOSIS — Z841 Family history of disorders of kidney and ureter: Secondary | ICD-10-CM | POA: Diagnosis not present

## 2016-05-09 DIAGNOSIS — J45909 Unspecified asthma, uncomplicated: Secondary | ICD-10-CM | POA: Diagnosis present

## 2016-05-09 DIAGNOSIS — D573 Sickle-cell trait: Secondary | ICD-10-CM | POA: Diagnosis present

## 2016-05-09 DIAGNOSIS — E1140 Type 2 diabetes mellitus with diabetic neuropathy, unspecified: Secondary | ICD-10-CM

## 2016-05-09 DIAGNOSIS — N179 Acute kidney failure, unspecified: Principal | ICD-10-CM | POA: Diagnosis present

## 2016-05-09 DIAGNOSIS — N183 Chronic kidney disease, stage 3 (moderate): Secondary | ICD-10-CM | POA: Diagnosis present

## 2016-05-09 DIAGNOSIS — I10 Essential (primary) hypertension: Secondary | ICD-10-CM

## 2016-05-09 DIAGNOSIS — E1165 Type 2 diabetes mellitus with hyperglycemia: Secondary | ICD-10-CM | POA: Diagnosis present

## 2016-05-09 DIAGNOSIS — E860 Dehydration: Secondary | ICD-10-CM | POA: Diagnosis present

## 2016-05-09 DIAGNOSIS — E1122 Type 2 diabetes mellitus with diabetic chronic kidney disease: Secondary | ICD-10-CM | POA: Diagnosis present

## 2016-05-09 DIAGNOSIS — E119 Type 2 diabetes mellitus without complications: Secondary | ICD-10-CM

## 2016-05-09 DIAGNOSIS — I509 Heart failure, unspecified: Secondary | ICD-10-CM | POA: Diagnosis present

## 2016-05-09 DIAGNOSIS — Z6841 Body Mass Index (BMI) 40.0 and over, adult: Secondary | ICD-10-CM | POA: Diagnosis not present

## 2016-05-09 DIAGNOSIS — K219 Gastro-esophageal reflux disease without esophagitis: Secondary | ICD-10-CM | POA: Diagnosis present

## 2016-05-09 LAB — CBC
HEMATOCRIT: 39.5 % (ref 39.0–52.0)
Hemoglobin: 13.2 g/dL (ref 13.0–17.0)
MCH: 27.3 pg (ref 26.0–34.0)
MCHC: 33.4 g/dL (ref 30.0–36.0)
MCV: 81.8 fL (ref 78.0–100.0)
PLATELETS: 206 10*3/uL (ref 150–400)
RBC: 4.83 MIL/uL (ref 4.22–5.81)
RDW: 14.5 % (ref 11.5–15.5)
WBC: 10.7 10*3/uL — AB (ref 4.0–10.5)

## 2016-05-09 LAB — URINALYSIS, ROUTINE W REFLEX MICROSCOPIC
BILIRUBIN URINE: NEGATIVE
Glucose, UA: 250 mg/dL — AB
Ketones, ur: NEGATIVE mg/dL
Leukocytes, UA: NEGATIVE
Nitrite: NEGATIVE
Specific Gravity, Urine: 1.013 (ref 1.005–1.030)
pH: 5 (ref 5.0–8.0)

## 2016-05-09 LAB — GLUCOSE, CAPILLARY
GLUCOSE-CAPILLARY: 274 mg/dL — AB (ref 65–99)
GLUCOSE-CAPILLARY: 309 mg/dL — AB (ref 65–99)
GLUCOSE-CAPILLARY: 221 mg/dL — AB (ref 65–99)
Glucose-Capillary: 320 mg/dL — ABNORMAL HIGH (ref 65–99)
Glucose-Capillary: 266 mg/dL — ABNORMAL HIGH (ref 65–99)

## 2016-05-09 LAB — URINE MICROSCOPIC-ADD ON

## 2016-05-09 LAB — BASIC METABOLIC PANEL
ANION GAP: 11 (ref 5–15)
BUN: 34 mg/dL — ABNORMAL HIGH (ref 6–20)
CALCIUM: 8.7 mg/dL — AB (ref 8.9–10.3)
CO2: 24 mmol/L (ref 22–32)
Chloride: 99 mmol/L — ABNORMAL LOW (ref 101–111)
Creatinine, Ser: 4.33 mg/dL — ABNORMAL HIGH (ref 0.61–1.24)
GFR, EST AFRICAN AMERICAN: 17 mL/min — AB (ref >60)
GFR, EST NON AFRICAN AMERICAN: 15 mL/min — AB (ref >60)
Glucose, Bld: 309 mg/dL — ABNORMAL HIGH (ref 65–99)
Potassium: 3.7 mmol/L (ref 3.5–5.1)
SODIUM: 134 mmol/L — AB (ref 135–145)

## 2016-05-09 LAB — BRAIN NATRIURETIC PEPTIDE: B Natriuretic Peptide: 13 pg/mL (ref 0.0–100.0)

## 2016-05-09 LAB — SODIUM, URINE, RANDOM: SODIUM UR: 19 mmol/L

## 2016-05-09 MED ORDER — METOPROLOL SUCCINATE ER 50 MG PO TB24
50.0000 mg | ORAL_TABLET | Freq: Every day | ORAL | Status: DC
  Administered 2016-05-09 – 2016-05-11 (×3): 50 mg via ORAL
  Filled 2016-05-09 (×3): qty 1

## 2016-05-09 MED ORDER — PREGABALIN 100 MG PO CAPS
100.0000 mg | ORAL_CAPSULE | Freq: Three times a day (TID) | ORAL | Status: DC
  Administered 2016-05-09 – 2016-05-11 (×7): 100 mg via ORAL
  Filled 2016-05-09 (×7): qty 1

## 2016-05-09 MED ORDER — SODIUM CHLORIDE 0.9 % IV SOLN
INTRAVENOUS | Status: DC
  Administered 2016-05-09: 04:00:00 via INTRAVENOUS

## 2016-05-09 MED ORDER — INSULIN DETEMIR 100 UNIT/ML ~~LOC~~ SOLN
40.0000 [IU] | Freq: Every day | SUBCUTANEOUS | Status: DC
  Filled 2016-05-09: qty 0.4

## 2016-05-09 MED ORDER — AMLODIPINE BESYLATE 10 MG PO TABS
10.0000 mg | ORAL_TABLET | Freq: Every day | ORAL | Status: DC
  Administered 2016-05-09 – 2016-05-11 (×3): 10 mg via ORAL
  Filled 2016-05-09 (×3): qty 1

## 2016-05-09 MED ORDER — OXYCODONE HCL 5 MG PO TABS
10.0000 mg | ORAL_TABLET | Freq: Four times a day (QID) | ORAL | Status: DC | PRN
  Administered 2016-05-10: 10 mg via ORAL
  Filled 2016-05-09: qty 2

## 2016-05-09 MED ORDER — HEPARIN SODIUM (PORCINE) 5000 UNIT/ML IJ SOLN
5000.0000 [IU] | Freq: Three times a day (TID) | INTRAMUSCULAR | Status: DC
  Administered 2016-05-09 – 2016-05-11 (×7): 5000 [IU] via SUBCUTANEOUS
  Filled 2016-05-09 (×7): qty 1

## 2016-05-09 MED ORDER — LIDOCAINE 5 % EX OINT: 1.0000 "application " | TOPICAL_OINTMENT | Freq: Two times a day (BID) | CUTANEOUS | Status: DC | PRN

## 2016-05-09 MED ORDER — POLYETHYLENE GLYCOL 3350 17 G PO PACK: 17.0000 g | PACK | Freq: Every day | ORAL | Status: DC | PRN

## 2016-05-09 MED ORDER — SODIUM CHLORIDE 0.9 % IV BOLUS (SEPSIS)
1000.0000 mL | Freq: Once | INTRAVENOUS | Status: AC
  Administered 2016-05-09: 1000 mL via INTRAVENOUS

## 2016-05-09 MED ORDER — INSULIN ASPART 100 UNIT/ML ~~LOC~~ SOLN
0.0000 [IU] | Freq: Three times a day (TID) | SUBCUTANEOUS | Status: DC
  Administered 2016-05-09: 12 [IU] via SUBCUTANEOUS
  Administered 2016-05-09: 11 [IU] via SUBCUTANEOUS
  Administered 2016-05-09: 8 [IU] via SUBCUTANEOUS
  Administered 2016-05-10: 15 [IU] via SUBCUTANEOUS
  Administered 2016-05-10: 8 [IU] via SUBCUTANEOUS
  Administered 2016-05-10 – 2016-05-11 (×2): 3 [IU] via SUBCUTANEOUS

## 2016-05-09 MED ORDER — SODIUM CHLORIDE 0.9 % IV SOLN
INTRAVENOUS | Status: DC
  Administered 2016-05-09: 100 mL/h via INTRAVENOUS
  Administered 2016-05-10: 22:00:00 via INTRAVENOUS

## 2016-05-09 MED ORDER — PROMETHAZINE HCL 25 MG PO TABS: 12.5000 mg | ORAL_TABLET | Freq: Four times a day (QID) | ORAL | Status: DC | PRN

## 2016-05-09 MED ORDER — INSULIN DETEMIR 100 UNIT/ML ~~LOC~~ SOLN
40.0000 [IU] | Freq: Every day | SUBCUTANEOUS | Status: DC
  Administered 2016-05-09: 40 [IU] via SUBCUTANEOUS
  Filled 2016-05-09 (×2): qty 0.4

## 2016-05-09 MED ORDER — METOCLOPRAMIDE HCL 10 MG PO TABS: 10.0000 mg | ORAL_TABLET | Freq: Three times a day (TID) | ORAL | Status: DC | PRN

## 2016-05-09 MED ORDER — DICYCLOMINE HCL 20 MG PO TABS
20.0000 mg | ORAL_TABLET | Freq: Two times a day (BID) | ORAL | Status: DC
  Administered 2016-05-09 – 2016-05-11 (×5): 20 mg via ORAL
  Filled 2016-05-09 (×5): qty 1

## 2016-05-09 NOTE — ED Provider Notes (Signed)
[facility]-EMERGENCY DEPT Provider Note   CSN: [id number] Arrival date & time: 05/08/16  2242  By signing my name below, I, Suzan SlickAshley N. Elon SpannerLeger, attest that this documentation has been prepared under the direction and in the presence of Alvira MondayErin Torrey Horseman, MD.  Electronically Signed: Suzan SlickAshley N. Elon SpannerLeger, ED Scribe. 05/09/16. 1:37 AM.    History   Chief Complaint Chief Complaint  Patient presents with  . Leg Swelling   HPI  HPI Comments: Kenneth Fox is a 45 y.o. male with a PMHx of CHF, diabetes, HTN, and sickle cell trait who presents to the Emergency Department complaining of constant, unchanged lower extremity swelling x 1 day. Pt states he is unable to walk without pain. He attributed swelling to extra salt that was added to his food at a restaurant. Denies any aggravating or alleviating factors at this time. Pt also reports intermittent nausea and vomiting earlier this week which he believes was related to history of gastroparesis. However, these symptoms have resolved. Denies any recent fever, chills, chest pain, or shortness of breath. Pt is able to void as normal. Pt admits he did not take his Lasix today as prescribed. In addition, pt states one of his medication dosages was recently increased but he is unsure which one.  PCP: Mike CrazeLONG,[name], PA-C    Past Medical History:  Diagnosis Date  . Asthma   . CHF (congestive heart failure) (HCC)   . Diabetes (HCC)   . Gastroparesis   . GERD (gastroesophageal reflux disease)   . Hypertension   . Kidney disorder   . Neuropathy (HCC)   . Obesity   . Obstructive sleep apnea    will not wear CPAP  . Renal insufficiency   . Sickle cell trait [facility](HCC)     Patient Active Problem List   Diagnosis Date Noted  . Acute kidney injury (HCC) 05/09/2016  . Dysphagia 12/12/2015  . Intractable nausea and vomiting 12/11/2015  . Leukocytosis 06/02/2015  . Morbid obesity due to excess calories (HCC) 06/02/2015  . Uncontrolled type 2 diabetes mellitus with  diabetic neuropathy, with long-term current use of insulin (HCC)   . Elevated troponin 06/01/2015  . AKI (acute kidney injury) (HCC) 12/03/2013  . Adverse reaction to lisinopril 12/03/2013  . Hypokalemia 11/25/2013  . Diabetic gastroparesis (HCC) 11/17/2013  . Obstructive sleep apnea 11/07/2012  . Essential hypertension 05/18/2011    Past Surgical History:  Procedure Laterality Date  . HIP FRACTURE SURGERY Right 1999   "put a plate in"       Home Medications    Prior to Admission medications   Medication Sig Start Date End Date Taking? Authorizing Provider  amLODipine (NORVASC) 10 MG tablet Take 10 mg by mouth daily. 04/27/16  Yes Historical Provider, MD  dicyclomine (BENTYL) 20 MG tablet Take 1 tablet (20 mg total) by mouth 2 (two) times daily. 05/29/15  Yes Benjamin Cartner, PA-C  furosemide (LASIX) 40 MG tablet Take 1 tablet (40 mg total) by mouth daily. HOLD LASIX UNTIL FOLLOW-UP WITH YOUR CARDIOLOGIST 12/15/15  Yes Ripudeep Jenna LuoK Rai, MD  hydrALAZINE (APRESOLINE) 50 MG tablet Take 1 tablet (50 mg total) by mouth 3 (three) times daily. 06/06/15  Yes Alison MurrayAlma M Devine, MD  insulin aspart (NOVOLOG) 100 UNIT/ML injection Inject 10-20 Units into the skin 3 (three) times daily with meals. per sliding scale CBG 70 - 120: 0 units CBG 121 - 150: 2 units CBG 151 - 200: 3 units CBG 201 - 250: 5 units CBG 251 - 300: 8  units CBG 301 - 350: 11 units CBG 351 - 400: 15 units 06/06/15  Yes Alison MurrayAlma M Devine, MD  insulin detemir (LEVEMIR) 100 UNIT/ML injection Inject 0.8 mLs (80 Units total) into the skin every evening. 12/13/15  Yes Ripudeep Jenna LuoK Rai, MD  lidocaine (XYLOCAINE) 5 % ointment Apply 1 application topically 2 (two) times daily as needed for pain. 04/15/16  Yes Historical Provider, MD  LYRICA 100 MG capsule Take 100 mg by mouth 3 (three) times daily. 05/02/15  Yes Historical Provider, MD  metFORMIN (GLUCOPHAGE) 500 MG tablet Take 1 tablet (500 mg total) by mouth 3 (three) times daily. 12/13/15  Yes  Ripudeep Jenna LuoK Rai, MD  metoprolol succinate (TOPROL-XL) 50 MG 24 hr tablet Take 50 mg by mouth daily. 10/18/15  Yes Historical Provider, MD  Oxycodone HCl 10 MG TABS Take 1 tablet by mouth every 6 (six) hours as needed (pain).  04/17/14  Yes Historical Provider, MD  polyethylene glycol powder (GLYCOLAX/MIRALAX) powder Take 17 g by mouth daily as needed for constipation. 02/19/16  Yes Historical Provider, MD  potassium chloride (KLOR-CON) 20 MEQ packet Take 20 mEq by mouth daily. 12/13/15  Yes Ripudeep Jenna LuoK Rai, MD  promethazine (PHENERGAN) 12.5 MG tablet Take 1 tablet (12.5 mg total) by mouth every 6 (six) hours as needed for nausea or vomiting. 12/13/15  Yes Ripudeep Jenna LuoK Rai, MD  metoCLOPramide (REGLAN) 10 MG tablet Take 1 tablet (10 mg total) by mouth every 8 (eight) hours as needed for nausea or vomiting. Patient not taking: Reported on 05/09/2016 12/06/15   Trixie DredgeEmily West, PA-C    Family History Family History  Problem Relation Age of Onset  . Sickle cell anemia Mother   . Heart attack Mother   . Diabetes Father   . Neuropathy Father   . Hypertension Father   . Aneurysm Father   . Kidney disease Brother   . Kidney disease Sister     Social History Social History  Substance Use Topics  . Smoking status: Never Smoker  . Smokeless tobacco: Never Used  . Alcohol use No     Allergies   Angiotensin receptor blockers and Lisinopril   Review of Systems Review of Systems  Constitutional: Negative for chills and fever.  HENT: Negative for sore throat.   Eyes: Negative for visual disturbance.  Respiratory: Negative for cough and shortness of breath.   Cardiovascular: Positive for leg swelling. Negative for chest pain.  Gastrointestinal: Positive for nausea and vomiting (now resolved). Negative for abdominal pain.  Genitourinary: Negative for decreased urine volume and difficulty urinating.  Musculoskeletal: Negative for back pain and neck stiffness.  Skin: Negative for rash.  Neurological:  Negative for syncope and headaches.  Psychiatric/Behavioral: Negative for confusion.  All other systems reviewed and are negative.    Physical Exam Updated Vital Signs BP (!) 154/91 (BP Location: Left Arm)   Pulse 70   Temp 98.1 F (36.7 C) (Oral)   Resp 18   Ht 5\' 11"  (1.803 m)   Wt (!) 319 lb 8 oz (144.9 kg)   SpO2 98%   BMI 44.56 kg/m   Physical Exam  Constitutional: He is oriented to person, place, and time. He appears well-developed and well-nourished.  HENT:  Head: Normocephalic and atraumatic.  Mouth/Throat: No oropharyngeal exudate.  Eyes: EOM are normal.  Neck: Normal range of motion.  Cardiovascular: Normal rate, regular rhythm, normal heart sounds and intact distal pulses.  Exam reveals no gallop and no friction rub.   No murmur heard.  Pulmonary/Chest: Effort normal and breath sounds normal. No respiratory distress.  Abdominal: Soft. He exhibits no distension. There is no tenderness.  Musculoskeletal: Normal range of motion. He exhibits edema (bilateral).  Neurological: He is alert and oriented to person, place, and time.  Skin: Skin is warm and dry.  Psychiatric: He has a normal mood and affect. Judgment normal.  Nursing note and vitals reviewed.    ED Treatments / Results   DIAGNOSTIC STUDIES: Oxygen Saturation is 92% on RA, adequate by my interpretation.    COORDINATION OF CARE: 1:35 AM- Will order blood work. Discussed treatment plan with pt at bedside and pt agreed to plan.     Labs (all labs ordered are listed, but only abnormal results are displayed) Labs Reviewed  BASIC METABOLIC PANEL - Abnormal; Notable for the following:       Result Value   Sodium 134 (*)    Potassium 3.3 (*)    Chloride 98 (*)    Glucose, Bld 268 (*)    BUN 32 (*)    Creatinine, Ser 4.58 (*)    Calcium 8.5 (*)    GFR calc non Af Amer 14 (*)    GFR calc Af Amer 16 (*)    All other components within normal limits  CBC - Abnormal; Notable for the following:    WBC  10.7 (*)    All other components within normal limits  BASIC METABOLIC PANEL - Abnormal; Notable for the following:    Sodium 134 (*)    Chloride 99 (*)    Glucose, Bld 309 (*)    BUN 34 (*)    Creatinine, Ser 4.33 (*)    Calcium 8.7 (*)    GFR calc non Af Amer 15 (*)    GFR calc Af Amer 17 (*)    All other components within normal limits  GLUCOSE, CAPILLARY - Abnormal; Notable for the following:    Glucose-Capillary 274 (*)    All other components within normal limits  GLUCOSE, CAPILLARY - Abnormal; Notable for the following:    Glucose-Capillary 309 (*)    All other components within normal limits  GLUCOSE, CAPILLARY - Abnormal; Notable for the following:    Glucose-Capillary 320 (*)    All other components within normal limits  GLUCOSE, CAPILLARY - Abnormal; Notable for the following:    Glucose-Capillary 266 (*)    All other components within normal limits  CBC  BRAIN NATRIURETIC PEPTIDE  URINALYSIS, ROUTINE W REFLEX MICROSCOPIC (NOT AT [facility])  SODIUM, URINE, RANDOM  COMPREHENSIVE METABOLIC PANEL  I-STAT TROPOININ, ED    EKG  EKG Interpretation None       Radiology US Renal  Result Date: 05/09/2016 CLINICAL DATA:  Initial evaluation for acute renal injury. EXAM: RENAL / URINARY TRACT ULTRASOUND COMPLETE COMPARISON:  Prior CT from 12/11/2015 FINDINGS: Right Kidney: Length: 12.5 cm. Echogenicity within normal limits. No mass or hydronephrosis visualized. Left Kidney: Length: 11.8 cm. Echogenicity within normal limits. No mass or hydronephrosis visualized. Bladder: Appears normal for degree of bladder distention. IMPRESSION: Normal renal ultrasound.  No hydronephrosis. Electronically Signed   By: Rise Mu M.D.   On: 05/09/2016 03:25    Procedures Procedures (including critical care time)  Medications Ordered in ED Medications  insulin aspart (novoLOG) injection 0-15 Units (11 Units Subcutaneous Given 05/09/16 1200)  heparin injection 5,000 Units (5,000  Units Subcutaneous Given 05/09/16 0648)  metoCLOPramide (REGLAN) tablet 10 mg (not administered)  promethazine (PHENERGAN) tablet 12.5 mg (not administered)  polyethylene glycol (MIRALAX / GLYCOLAX) packet 17 g (not administered)  metoprolol succinate (TOPROL-XL) 24 hr tablet 50 mg (50 mg Oral Given 05/09/16 0901)  pregabalin (LYRICA) capsule 100 mg (100 mg Oral Given 05/09/16 0901)  lidocaine (XYLOCAINE) 5 % ointment 1 application (not administered)  dicyclomine (BENTYL) tablet 20 mg (20 mg Oral Given 05/09/16 0901)  amLODipine (NORVASC) tablet 10 mg (10 mg Oral Given 05/09/16 0901)  oxyCODONE (Oxy IR/ROXICODONE) immediate release tablet 10 mg (not administered)  insulin detemir (LEVEMIR) injection 40 Units (not administered)  0.9 %  sodium chloride infusion (not administered)  sodium chloride 0.9 % bolus 1,000 mL (1,000 mLs Intravenous New Bag/Given 05/09/16 0308)     Initial Impression / Assessment and Plan / ED Course  I have reviewed the triage vital signs and the nursing notes.  Pertinent labs & imaging results that were available during my care of the patient were reviewed by me and considered in my medical decision making (see chart for details).  Clinical Course    45yo male with history of CHF on home O2, DM, htn, asthma, recent episode of gastroparesis and increase in BP medication presents with concern for leg swelling bilaterally. Labs show significant AKI Cr 1.8 to 4.58.  Hospitalist consulted for admission and further evaluation.  Final Clinical Impressions(s) / ED Diagnoses   Final diagnoses:  AKI (acute kidney injury) [facility])    New Prescriptions Current Discharge Medication List     I personally performed the services described in this documentation, which was scribed in my presence. The recorded information has been reviewed and is accurate.    Alvira Monday, MD 05/09/16 1714

## 2016-05-09 NOTE — Consult Note (Signed)
Renal Service Consult Note [facility]  Kenneth Fox 05/09/2016 Kenneth Fox D Requesting Physician:  Dr. Bonner Puna  Reason for Consult:  Acute on CRF HPI: The patient is a 45 y.o. year-old with history of DM2, HTN, OSA/ morbid obesity, gastroparesis, recurrent N/V and CKD stage3.  Patient was in the ED with epigastric pain on 11/27 which is 5 days ago.  Got IVF and haldol, retching improved and pt dc'd home.  Prob recurrent gastroparesis symtpoms. Then showed up back in [facility] 12/1 yesterday with c/o of swollen legs, "like grapes", difficulty walking and dec'd sensation in the feet bilat. Creat was 1.8 on recent visit but yest creat was up to 4.5.  A postvoid bladder scan was negative. Renal US ordered. GIven IVF's overnight, felt to be dry.  Creat not better. Asked to see for a/c renal failure.   Patient denies any difficulty voiding, change in urine color. No nsaids.  Not taking ACEi or ARB.  On 3 bp meds and lasix 40/d.   No SOB or orthopnea.  Weights are down 4-5 kg from when he was in ED on 11/27   Home meds > norvasc, bentyl, lasix 40/d, hydralazine, insulin, lyrica, metformin, metoprolol, oxycodone, KCl, phenergan, Reglan, Valium, PPI  Old Chart: 2012 - fatty liver, viral GE, uncont DM, HTN, obesity, OSA, N/V/ D, gastroparesis 2014 - same 2015 - gastroparesis, DM, HTN, OSA, recur N/V, ^QT, constipation/ DKA/ AKI, adverse rxn to ACEi 2016 - AKI, HTN, OSA, DGP, Kenneth Fox, ^wbc, uncont DM2, MO, OSA/ CPAP July 2017 - intract N/V, DGP, DM, OSA, chron diast CHF > CT abd/ pelv was neg, LFT"s ok. Esophagogram was negative. N/V / dysphagia resolved. Diab neuropathy  Date  Creat  eGFR  CKD stage 2008-14 0.8- 1.3 2015  0.9- 2.9 25- >60 II   2016  1.3- 5.3 12- 59 Feb 2017 1.4- 1.7 May '17 1.4- 2.0 July '17 1.4- 2.1 35- 58  III Sept '17 1.74 Nov '17 1.5- 1.8 43- 53  III May 08, 2016 4.58    ROS  denies CP  no joint pain   no HA  no blurry vision  no rash  no diarrhea  no nausea/ vomiting  no dysuria  no difficulty voiding  no change in urine color    Past Medical History  Past Medical History:  Diagnosis Date  . Asthma   . CHF (congestive heart failure) ([place])   . Diabetes ([place])   . Gastroparesis   . GERD (gastroesophageal reflux disease)   . Hypertension   . Kidney disorder   . Neuropathy ([place])   . Obesity   . Obstructive sleep apnea    will not wear CPAP  . Renal insufficiency   . Sickle cell trait [facility])    Past Surgical History  Past Surgical History:  Procedure Laterality Date  . HIP FRACTURE SURGERY Right 1999   "put a plate in"   Family History  Family History  Problem Relation Age of Onset  . Sickle cell anemia Mother   . Heart attack Mother   . Diabetes Father   . Neuropathy Father   . Hypertension Father   . Aneurysm Father   . Kidney disease Brother   . Kidney disease Sister    Social History  reports that he has never smoked. He has never used smokeless tobacco. He reports that he does not drink alcohol or use drugs. Allergies  Allergies  Allergen Reactions  . Angiotensin Receptor Blockers Other (See Comments)  Acute renal failure in patient w R heart failure  . Lisinopril Other (See Comments)    Patient developed AKI after being on lisinopril for a week.   Home medications Prior to Admission medications   Medication Sig Start Date End Date Taking? Authorizing Provider  amLODipine (NORVASC) 10 MG tablet Take 10 mg by mouth daily. 04/27/16  Yes Historical Provider, MD  dicyclomine (BENTYL) 20 MG tablet Take 1 tablet (20 mg total) by mouth 2 (two) times daily. 05/29/15  Yes Benjamin Cartner, PA-C  furosemide (LASIX) 40 MG tablet Take 1 tablet (40 mg total) by mouth daily. HOLD LASIX UNTIL FOLLOW-UP WITH YOUR CARDIOLOGIST 12/15/15  Yes Ripudeep Krystal Eaton, MD  hydrALAZINE (APRESOLINE) 50 MG tablet Take 1 tablet (50 mg total) by mouth 3 (three) times daily. 06/06/15  Yes Robbie Lis, MD  insulin aspart (NOVOLOG) 100  UNIT/ML injection Inject 10-20 Units into the skin 3 (three) times daily with meals. per sliding scale CBG 70 - 120: 0 units CBG 121 - 150: 2 units CBG 151 - 200: 3 units CBG 201 - 250: 5 units CBG 251 - 300: 8 units CBG 301 - 350: 11 units CBG 351 - 400: 15 units 06/06/15  Yes Robbie Lis, MD  insulin detemir (LEVEMIR) 100 UNIT/ML injection Inject 0.8 mLs (80 Units total) into the skin every evening. 12/13/15  Yes Ripudeep Krystal Eaton, MD  lidocaine (XYLOCAINE) 5 % ointment Apply 1 application topically 2 (two) times daily as needed for pain. 04/15/16  Yes Historical Provider, MD  LYRICA 100 MG capsule Take 100 mg by mouth 3 (three) times daily. 05/02/15  Yes Historical Provider, MD  metFORMIN (GLUCOPHAGE) 500 MG tablet Take 1 tablet (500 mg total) by mouth 3 (three) times daily. 12/13/15  Yes Ripudeep Krystal Eaton, MD  metoprolol succinate (TOPROL-XL) 50 MG 24 hr tablet Take 50 mg by mouth daily. 10/18/15  Yes Historical Provider, MD  Oxycodone HCl 10 MG TABS Take 1 tablet by mouth every 6 (six) hours as needed (pain).  04/17/14  Yes Historical Provider, MD  polyethylene glycol powder (GLYCOLAX/MIRALAX) powder Take 17 g by mouth daily as needed for constipation. 02/19/16  Yes Historical Provider, MD  potassium chloride (KLOR-CON) 20 MEQ packet Take 20 mEq by mouth daily. 12/13/15  Yes Ripudeep Krystal Eaton, MD  promethazine (PHENERGAN) 12.5 MG tablet Take 1 tablet (12.5 mg total) by mouth every 6 (six) hours as needed for nausea or vomiting. 12/13/15  Yes Ripudeep Krystal Eaton, MD  metoCLOPramide (REGLAN) 10 MG tablet Take 1 tablet (10 mg total) by mouth every 8 (eight) hours as needed for nausea or vomiting. Patient not taking: Reported on 05/09/2016 12/06/15   Clayton Bibles, PA-C   Liver Function Tests  Recent Labs Lab 05/04/16 1920  AST 19  ALT 13*  ALKPHOS 100  BILITOT 0.8  PROT 7.7  ALBUMIN 3.4*    Recent Labs Lab 05/04/16 1920  LIPASE 47   CBC  Recent Labs Lab 05/06/16 1018 05/06/16 1039 05/08/16 2304  05/09/16 0257  WBC 13.7*  --  9.8 10.7*  NEUTROABS 12.2*  --   --   --   HGB 13.9 15.6 13.5 13.2  HCT 41.3 46.0 40.0 39.5  MCV 82.6  --  82.1 81.8  PLT 275  --  226 500   Basic Metabolic Panel  Recent Labs Lab 05/04/16 1920 05/06/16 1039 05/08/16 2304 05/09/16 0257  NA 139 140 134* 134*  K 3.8 3.6 3.3* 3.7  CL 106  101 98* 99*  CO2 24  --  25 24  GLUCOSE 260* 350* 268* 309*  BUN 13 16 32* 34*  CREATININE 1.51* 1.80* 4.58* 4.33*  CALCIUM 9.0  --  8.5* 8.7*   Iron/TIBC/Ferritin/ %Sat No results found for: IRON, TIBC, FERRITIN, IRONPCTSAT  Vitals:   05/09/16 0100 05/09/16 0130 05/09/16 0401 05/09/16 0618  BP: 129/86 147/89 (!) 147/84 (!) 154/91  Pulse: 72 77 70 70  Resp:  14 16 18   Temp:   97.5 F (36.4 C) 98.1 F (36.7 C)  TempSrc:   Oral Oral  SpO2: 96% 98% 97% 98%  Weight:   (!) 144.9 kg (319 lb 8 oz)   Height:   5' 11"  (1.803 m)    Exam Gen alert,very obese adult male No rash, cyanosis or gangrene Sclera anicteric, throat clear No jvd or bruits Chest clear bilat RRR no MRG Abd soft ntnd no mass or ascites +bs very obese GU normal male MS no joint effusions or deformity Ext 1+ bilat LE pretib edema / no wounds or ulcers Neuro is alert, Ox 3 , nf, no asterixis  Na 134   K 3.7  BUN 34  Cr 4.33 Renal US no hydro UA pend  Assessment: 1  Acute on CKD 3 - prob from vol depletion. Creat down today slightly, would resume IVF's  2  Recurrent N/V - gastroparesis 3  DM2 4  Morbid obesity 5  HTN on 3 bp meds  Plan - as above  Kelly Splinter MD [facility] pager [phone]   05/09/2016, 1:43 PM

## 2016-05-09 NOTE — H&P (Signed)
History and Physical    Kenneth Fox LKG:[id number] DOB: 1971-03-26 DOA: 05/09/2016   PCP: Mike Craze Chief Complaint:  Chief Complaint  Patient presents with  . Leg Swelling    HPI: Kenneth Fox is a 45 y.o. male with medical history significant of DM2, CHF, HTN.  Patient presents to the ED with c/o constant, unchanged, BLE swelling x1 day.  Patient states he is unable to walk without pain.  He attributed swelling to extra salt that was added to food at a restaurant.  Does say that he is actually quite dehydrated and has essentially 0 PO intake over the past day or so that he kept down due to nausea and vomiting which he attributes to diabetic gastroparesis.  He was seen in the ED 2 days ago for N/V and creat was 1.8 at that time.  N/V resolved at present time.  Patient admits he didn't take his Lasix today as prescribed.  Last week a blood pressure medication dose was increased but he dosent know which one.  ED Course: Patient is actually in AKF with creatinine of 4.5 up from 1.8 just 2 days ago.  Review of Systems: As per HPI otherwise 10 point review of systems negative.    Past Medical History:  Diagnosis Date  . Asthma   . CHF (congestive heart failure) (HCC)   . Diabetes (HCC)   . Gastroparesis   . GERD (gastroesophageal reflux disease)   . Hypertension   . Kidney disorder   . Neuropathy (HCC)   . Obesity   . Obstructive sleep apnea    will not wear CPAP  . Renal insufficiency   . Sickle cell trait [facility])     Past Surgical History:  Procedure Laterality Date  . HIP FRACTURE SURGERY Right 1999   "put a plate in"     reports that he has never smoked. He has never used smokeless tobacco. He reports that he does not drink alcohol or use drugs.  Allergies  Allergen Reactions  . Angiotensin Receptor Blockers Other (See Comments)    Acute renal failure in patient w R heart failure  . Lisinopril Other (See Comments)    Patient developed AKI after being on  lisinopril for a week.    Family History  Problem Relation Age of Onset  . Sickle cell anemia Mother   . Heart attack Mother   . Diabetes Father   . Neuropathy Father   . Hypertension Father   . Aneurysm Father   . Kidney disease Brother   . Kidney disease Sister       Prior to Admission medications   Medication Sig Start Date End Date Taking? Authorizing Provider  amLODipine (NORVASC) 10 MG tablet Take 10 mg by mouth daily. 04/27/16  Yes Historical Provider, MD  dicyclomine (BENTYL) 20 MG tablet Take 1 tablet (20 mg total) by mouth 2 (two) times daily. 05/29/15  Yes Benjamin Cartner, PA-C  furosemide (LASIX) 40 MG tablet Take 1 tablet (40 mg total) by mouth daily. HOLD LASIX UNTIL FOLLOW-UP WITH YOUR CARDIOLOGIST 12/15/15  Yes Ripudeep Jenna Luo, MD  hydrALAZINE (APRESOLINE) 50 MG tablet Take 1 tablet (50 mg total) by mouth 3 (three) times daily. 06/06/15  Yes Alison Murray, MD  insulin aspart (NOVOLOG) 100 UNIT/ML injection Inject 10-20 Units into the skin 3 (three) times daily with meals. per sliding scale CBG 70 - 120: 0 units CBG 121 - 150: 2 units CBG 151 - 200: 3 units CBG 201 -  250: 5 units CBG 251 - 300: 8 units CBG 301 - 350: 11 units CBG 351 - 400: 15 units 06/06/15  Yes Alison Murray, MD  insulin detemir (LEVEMIR) 100 UNIT/ML injection Inject 0.8 mLs (80 Units total) into the skin every evening. 12/13/15  Yes Ripudeep Jenna Luo, MD  lidocaine (XYLOCAINE) 5 % ointment Apply 1 application topically 2 (two) times daily as needed for pain. 04/15/16  Yes Historical Provider, MD  LYRICA 100 MG capsule Take 100 mg by mouth 3 (three) times daily. 05/02/15  Yes Historical Provider, MD  metFORMIN (GLUCOPHAGE) 500 MG tablet Take 1 tablet (500 mg total) by mouth 3 (three) times daily. 12/13/15  Yes Ripudeep Jenna Luo, MD  metoprolol succinate (TOPROL-XL) 50 MG 24 hr tablet Take 50 mg by mouth daily. 10/18/15  Yes Historical Provider, MD  Oxycodone HCl 10 MG TABS Take 1 tablet by mouth every 6 (six)  hours as needed (pain).  04/17/14  Yes Historical Provider, MD  polyethylene glycol powder (GLYCOLAX/MIRALAX) powder Take 17 g by mouth daily as needed for constipation. 02/19/16  Yes Historical Provider, MD  potassium chloride (KLOR-CON) 20 MEQ packet Take 20 mEq by mouth daily. 12/13/15  Yes Ripudeep Jenna Luo, MD  promethazine (PHENERGAN) 12.5 MG tablet Take 1 tablet (12.5 mg total) by mouth every 6 (six) hours as needed for nausea or vomiting. 12/13/15  Yes Ripudeep Jenna Luo, MD  metoCLOPramide (REGLAN) 10 MG tablet Take 1 tablet (10 mg total) by mouth every 8 (eight) hours as needed for nausea or vomiting. Patient not taking: Reported on 05/09/2016 12/06/15   Trixie Dredge, PA-C    Physical Exam: Vitals:   05/08/16 2301 05/09/16 0100 05/09/16 0130  BP: 149/88 129/86 147/89  Pulse: 81 72 77  Resp: 18  14  Temp: 98.7 F (37.1 C)    TempSrc: Oral    SpO2: 92% 96% 98%      Constitutional: NAD, calm, comfortable Eyes: PERRL, lids and conjunctivae normal ENMT: Mucous membranes are dry. Posterior pharynx clear of any exudate or lesions.Normal dentition.  Neck: normal, supple, no masses, no thyromegaly Respiratory: clear to auscultation bilaterally, no wheezing, no crackles. Normal respiratory effort. No accessory muscle use.  Cardiovascular: Regular rate and rhythm, no murmurs / rubs / gallops. No extremity edema. 2+ pedal pulses. No carotid bruits.  Abdomen: no tenderness, no masses palpated. No hepatosplenomegaly. Bowel sounds positive.  Musculoskeletal: no clubbing / cyanosis. No joint deformity upper and lower extremities. Good ROM, no contractures. Normal muscle tone.  Skin: no rashes, lesions, ulcers. No induration Neurologic: CN 2-12 grossly intact. Sensation intact, DTR normal. Strength 5/5 in all 4.  Psychiatric: Normal judgment and insight. Alert and oriented x 3. Normal mood.    Labs on Admission: I have personally reviewed following labs and imaging studies  CBC:  Recent Labs Lab  05/04/16 1920 05/06/16 1018 05/06/16 1039 05/08/16 2304  WBC 10.9* 13.7*  --  9.8  NEUTROABS  --  12.2*  --   --   HGB 14.3 13.9 15.6 13.5  HCT 41.2 41.3 46.0 40.0  MCV 80.8 82.6  --  82.1  PLT 261 275  --  226   Basic Metabolic Panel:  Recent Labs Lab 05/04/16 1920 05/06/16 1018 05/06/16 1039 05/08/16 2304  NA 139  --  140 134*  K 3.8  --  3.6 3.3*  CL 106  --  101 98*  CO2 24  --   --  25  GLUCOSE 260*  --  350* 268*  BUN 13  --  16 32*  CREATININE 1.51*  --  1.80* 4.58*  CALCIUM 9.0  --   --  8.5*  MG  --  2.1  --   --    GFR: Estimated Creatinine Clearance: 29.6 mL/min (by C-G formula based on SCr of 4.58 mg/dL (H)). Liver Function Tests:  Recent Labs Lab 05/04/16 1920  AST 19  ALT 13*  ALKPHOS 100  BILITOT 0.8  PROT 7.7  ALBUMIN 3.4*    Recent Labs Lab 05/04/16 1920  LIPASE 47   No results for input(s): AMMONIA in the last 168 hours. Coagulation Profile: No results for input(s): INR, PROTIME in the last 168 hours. Cardiac Enzymes: No results for input(s): CKTOTAL, CKMB, CKMBINDEX, TROPONINI in the last 168 hours. BNP (last 3 results) No results for input(s): PROBNP in the last 8760 hours. HbA1C: No results for input(s): HGBA1C in the last 72 hours. CBG:  Recent Labs Lab 05/04/16 1929 05/06/16 1020  GLUCAP 244* 352*   Lipid Profile: No results for input(s): CHOL, HDL, LDLCALC, TRIG, CHOLHDL, LDLDIRECT in the last 72 hours. Thyroid Function Tests: No results for input(s): TSH, T4TOTAL, FREET4, T3FREE, THYROIDAB in the last 72 hours. Anemia Panel: No results for input(s): VITAMINB12, FOLATE, FERRITIN, TIBC, IRON, RETICCTPCT in the last 72 hours. Urine analysis:    Component Value Date/Time   COLORURINE YELLOW 05/06/2016 1003   APPEARANCEUR CLEAR 05/06/2016 1003   LABSPEC 1.014 05/06/2016 1003   PHURINE 6.5 05/06/2016 1003   GLUCOSEU >1000 (A) 05/06/2016 1003   HGBUR MODERATE (A) 05/06/2016 1003   BILIRUBINUR NEGATIVE 05/06/2016 1003     KETONESUR 15 (A) 05/06/2016 1003   PROTEINUR >300 (A) 05/06/2016 1003   UROBILINOGEN 1.0 01/17/2015 2230   NITRITE NEGATIVE 05/06/2016 1003   LEUKOCYTESUR NEGATIVE 05/06/2016 1003   Sepsis Labs: @LABRCNTIP (procalcitonin:4,lacticidven:4) )No results found for this or any previous visit (from the past 240 hour(s)).   Radiological Exams on Admission: No results found.  EKG: Independently reviewed.  Assessment/Plan Principal Problem:   Acute kidney injury (HCC) Active Problems:   Essential hypertension   Diabetic gastroparesis (HCC)   Uncontrolled type 2 diabetes mellitus with diabetic neuropathy, with long-term current use of insulin (HCC)    1. AKI - 1. Unclear cause 2. Post void bladder scan is negative 3. Renal US ordered 4. IVF: 1L bolus and 125 cc/hr ordered, patient does have dry mucous membranes and recent severe N/V from diabetic gastroparesis up until today. 5. However, it is concerning that his XWR:UEAVWBUN:creat ratio is <10:1 which is not at all c/w a pre-renal picture 6. Unlike nearly identical presentation in Dec of last year: this time patient is not on ARB, [place] or other obvious nephrotoxic meds. 7. Will hold hydralazine (just incase this is causing AIN or drug induced lupus) 8. Patient not currently taking any NSAIDs 9. If no improvement with empiric hydration overnight, likely will warrant nephrology consult in AM. 10. Hold lasix 2. HTN - 1. Continue home meds except hydralazine 3. Diabetic gastroparesis - 1. Reglan PRN, but not having vomiting right now 4. DM2 - 1. Reduce home levemir dose to 40 QHS 2. Mod scale SSI AC 3. Holding Metformin   DVT prophylaxis: Heparin [place] Code Status: Full Family Communication: Family at bedside Consults called: None Admission status: Admit to inpatient   Hillary BowGARDNER, JARED M. DO [facility] Pager [phone] from 7PM-7AM  If 7AM-7PM, please contact the day physician for the patient [email or web address] Password  [email or web address]  05/09/2016, 3:14 AM

## 2016-05-09 NOTE — Progress Notes (Signed)
PROGRESS NOTE  Kenneth Fox  [id number] DOB: 1970-10-11 DOA: 05/09/2016 PCP: Mike CrazeLONG,[name], PA-C  Outpatient Specialists: Nephrology, [name]  Brief Narrative: Kenneth Fox is a 45 y.o. male with history of poorly-controlled IDT2DM, CHF, HTN. He presented late 12/1 for bilateral LE swelling in the setting of 3 days of unremitting nausea and vomiting attributed to gastroparesis (many ED visits and some admissions with similar presentation). He's not been taking medications during this time. Creatinine was noted to be 4.5 up from 1.8 just 2 days prior.   Assessment & Plan: Principal Problem:   Acute kidney injury (HCC) Active Problems:   Essential hypertension   Diabetic gastroparesis (HCC)   Uncontrolled type 2 diabetes mellitus with diabetic neuropathy, with long-term current use of insulin (HCC)  Acute renal failure: Cr 4.58 on admission from 1.8 2 days prior. Baseline SCr since AKI Dec 2016 has been ~1.5-1.7, prior to that 1.1 with AKI in 2015 to SCr 2.9. ?prerenal due to dehydration due to vomiting but BUN/Cr ratio not typical. Renal U/S with large kidneys and normal echogenicity without hydro. Postvoid residual negative. No longer taking ARB (presumed cause of previous AKI).  - Not significantly improved with IVF's. I've ordered UA and FENa labs and called nephrology for consultation.  - Hold IVF's and lasix for now - Hydralazine stopped due to ?AIN - Repeat echo: Dec 2016 showed systolic flattening of ventricular septum and RV dilation with moderately reduced systolic function. BNP wnl in morbidly obese male.   HTN: Chronic, stable.  - Continue home meds except hydralazine  Diabetic gastroparesis: Suspected cause of N/V though previous ED visit also showed ketonuria and anion gap of 23 without acidosis so hyperglycemia likely contributing - Continue reglan  IDT2DM: Chronic, poorly-controlled.  - Holding metformin. - Need to ensure he can check CBGs at home  following discharge.  - Give levemir 40u now and increase tonight (takes 80u qHS and none yet given) - Mod scale SSI AC  DVT prophylaxis: Subcutaneous heparin Code Status: Full Family Communication: Family at bedside this AM Disposition Plan: Inpatient management of acute renal failure  Consultants:   Nephrology, Dr. Arlean HoppingSchertz  Procedures:  Renal US 12/2: 12.5cm / 11.8cm normal echogenicity and no hydronephrosis.   Antimicrobials:  None   Subjective: Pt has not slept since admission. Started urinating more this AM. Ate breakfast without N/V.   Objective: Vitals:   05/09/16 0100 05/09/16 0130 05/09/16 0401 05/09/16 0618  BP: 129/86 147/89 (!) 147/84 (!) 154/91  Pulse: 72 77 70 70  Resp:  14 16 18   Temp:   97.5 F (36.4 C) 98.1 F (36.7 C)  TempSrc:   Oral Oral  SpO2: 96% 98% 97% 98%  Weight:   (!) 144.9 kg (319 lb 8 oz)   Height:   5\' 11"  (1.803 m)     Intake/Output Summary (Last 24 hours) at 05/09/16 1102 Last data filed at 05/09/16 [id number]  Gross per 24 hour  Intake            16.67 ml  Output             1140 ml  Net         -1123.33 ml   Filed Weights   05/09/16 0401  Weight: (!) 144.9 kg (319 lb 8 oz)    Examination: General exam: Morbidly obese male in no distress snoring very loudly while sleeping Respiratory system: Non-labored breathing ambient air. Clear to auscultation bilaterally.  Cardiovascular system: Regular rate and rhythm.  No murmur, rub, or gallop. Unable to appreciate JVD. 3+ pitting bilateral LE edema. No UE edema.  Gastrointestinal system: Abdomen obese, soft, non-tender, non-distended, with normoactive bowel sounds. No organomegaly or masses felt. Central nervous system: Alert and oriented. No focal neurological deficits. Extremities: Warm, no deformities Skin: No rashes, lesions no ulcers Psychiatry: Judgement and insight appear normal. Mood & affect appropriate.   Data Reviewed: I have personally reviewed following labs and imaging  studies  CBC:  Recent Labs Lab 05/04/16 1920 05/06/16 1018 05/06/16 1039 05/08/16 2304 05/09/16 0257  WBC 10.9* 13.7*  --  9.8 10.7*  NEUTROABS  --  12.2*  --   --   --   HGB 14.3 13.9 15.6 13.5 13.2  HCT 41.2 41.3 46.0 40.0 39.5  MCV 80.8 82.6  --  82.1 81.8  PLT 261 275  --  226 206   Basic Metabolic Panel:  Recent Labs Lab 05/04/16 1920 05/06/16 1018 05/06/16 1039 05/08/16 2304 05/09/16 0257  NA 139  --  140 134* 134*  K 3.8  --  3.6 3.3* 3.7  CL 106  --  101 98* 99*  CO2 24  --   --  25 24  GLUCOSE 260*  --  350* 268* 309*  BUN 13  --  16 32* 34*  CREATININE 1.51*  --  1.80* 4.58* 4.33*  CALCIUM 9.0  --   --  8.5* 8.7*  MG  --  2.1  --   --   --    GFR: Estimated Creatinine Clearance: 30.8 mL/min (by C-G formula based on SCr of 4.33 mg/dL (H)). Liver Function Tests:  Recent Labs Lab 05/04/16 1920  AST 19  ALT 13*  ALKPHOS 100  BILITOT 0.8  PROT 7.7  ALBUMIN 3.4*    Recent Labs Lab 05/04/16 1920  LIPASE 47   No results for input(s): AMMONIA in the last 168 hours. Coagulation Profile: No results for input(s): INR, PROTIME in the last 168 hours. Cardiac Enzymes: No results for input(s): CKTOTAL, CKMB, CKMBINDEX, TROPONINI in the last 168 hours. BNP (last 3 results) No results for input(s): PROBNP in the last 8760 hours. HbA1C: No results for input(s): HGBA1C in the last 72 hours. CBG:  Recent Labs Lab 05/04/16 1929 05/06/16 1020 05/09/16 0349 05/09/16 0825  GLUCAP 244* 352* 274* 309*   Lipid Profile: No results for input(s): CHOL, HDL, LDLCALC, TRIG, CHOLHDL, LDLDIRECT in the last 72 hours. Thyroid Function Tests: No results for input(s): TSH, T4TOTAL, FREET4, T3FREE, THYROIDAB in the last 72 hours. Anemia Panel: No results for input(s): VITAMINB12, FOLATE, FERRITIN, TIBC, IRON, RETICCTPCT in the last 72 hours. Urine analysis:    Component Value Date/Time   COLORURINE YELLOW 05/06/2016 1003   APPEARANCEUR CLEAR 05/06/2016 1003    LABSPEC 1.014 05/06/2016 1003   PHURINE 6.5 05/06/2016 1003   GLUCOSEU >1000 (A) 05/06/2016 1003   HGBUR MODERATE (A) 05/06/2016 1003   BILIRUBINUR NEGATIVE 05/06/2016 1003   KETONESUR 15 (A) 05/06/2016 1003   PROTEINUR >300 (A) 05/06/2016 1003   UROBILINOGEN 1.0 01/17/2015 2230   NITRITE NEGATIVE 05/06/2016 1003   LEUKOCYTESUR NEGATIVE 05/06/2016 1003   Sepsis Labs: @LABRCNTIP (procalcitonin:4,lacticidven:4)  )No results found for this or any previous visit (from the past 240 hour(s)).   Radiology Studies: [place] Renal  Result Date: 05/09/2016 CLINICAL DATA:  Initial evaluation for acute renal injury. EXAM: RENAL / URINARY TRACT ULTRASOUND COMPLETE COMPARISON:  Prior CT from 12/11/2015 FINDINGS: Right Kidney: Length: 12.5 cm. Echogenicity within normal limits.  No mass or hydronephrosis visualized. Left Kidney: Length: 11.8 cm. Echogenicity within normal limits. No mass or hydronephrosis visualized. Bladder: Appears normal for degree of bladder distention. IMPRESSION: Normal renal ultrasound.  No hydronephrosis. Electronically Signed   By: Rise Mu M.D.   On: 05/09/2016 03:25    Scheduled Meds: . amLODipine  10 mg Oral Daily  . dicyclomine  20 mg Oral BID  . heparin  5,000 Units Subcutaneous Q8H  . insulin aspart  0-15 Units Subcutaneous TID WC  . insulin detemir  40 Units Subcutaneous QHS  . metoprolol succinate  50 mg Oral Daily  . pregabalin  100 mg Oral TID   Continuous Infusions: . sodium chloride 125 mL/hr at 05/09/16 0352     LOS: 0 days   Time spent: 25 minutes.  Hazeline Junker, MD [facility] Pager [phone]  If 7PM-7AM, please contact night-coverage [email or web address] Password [email or web address] 05/09/2016, 11:02 AM

## 2016-05-09 NOTE — ED Notes (Signed)
Patient transported to Ultrasound 

## 2016-05-10 ENCOUNTER — Inpatient Hospital Stay (HOSPITAL_COMMUNITY): Payer: Medicare Other

## 2016-05-10 DIAGNOSIS — I10 Essential (primary) hypertension: Secondary | ICD-10-CM

## 2016-05-10 LAB — ECHOCARDIOGRAM COMPLETE
Height: 71 in
Weight: 5112 oz

## 2016-05-10 LAB — COMPREHENSIVE METABOLIC PANEL
ALK PHOS: 73 U/L (ref 38–126)
ALT: 11 U/L — ABNORMAL LOW (ref 17–63)
AST: 11 U/L — ABNORMAL LOW (ref 15–41)
Albumin: 2.6 g/dL — ABNORMAL LOW (ref 3.5–5.0)
Anion gap: 7 (ref 5–15)
BILIRUBIN TOTAL: 0.3 mg/dL (ref 0.3–1.2)
BUN: 28 mg/dL — ABNORMAL HIGH (ref 6–20)
CALCIUM: 8.4 mg/dL — AB (ref 8.9–10.3)
CO2: 27 mmol/L (ref 22–32)
Chloride: 104 mmol/L (ref 101–111)
Creatinine, Ser: 2.57 mg/dL — ABNORMAL HIGH (ref 0.61–1.24)
GFR, EST AFRICAN AMERICAN: 33 mL/min — AB (ref >60)
GFR, EST NON AFRICAN AMERICAN: 28 mL/min — AB (ref >60)
Glucose, Bld: 300 mg/dL — ABNORMAL HIGH (ref 65–99)
Potassium: 3.5 mmol/L (ref 3.5–5.1)
Sodium: 138 mmol/L (ref 135–145)
TOTAL PROTEIN: 5.8 g/dL — AB (ref 6.5–8.1)

## 2016-05-10 LAB — GLUCOSE, CAPILLARY
GLUCOSE-CAPILLARY: 257 mg/dL — AB (ref 65–99)
GLUCOSE-CAPILLARY: 374 mg/dL — AB (ref 65–99)
GLUCOSE-CAPILLARY: 198 mg/dL — AB (ref 65–99)
Glucose-Capillary: 328 mg/dL — ABNORMAL HIGH (ref 65–99)

## 2016-05-10 MED ORDER — PERFLUTREN LIPID MICROSPHERE
1.0000 mL | INTRAVENOUS | Status: AC | PRN
  Administered 2016-05-10: 2 mL via INTRAVENOUS

## 2016-05-10 MED ORDER — INSULIN DETEMIR 100 UNIT/ML ~~LOC~~ SOLN
60.0000 [IU] | Freq: Every day | SUBCUTANEOUS | Status: DC
  Administered 2016-05-10: 60 [IU] via SUBCUTANEOUS
  Filled 2016-05-10 (×2): qty 0.6

## 2016-05-10 MED ORDER — INSULIN DETEMIR 100 UNIT/ML ~~LOC~~ SOLN
20.0000 [IU] | Freq: Once | SUBCUTANEOUS | Status: AC
  Administered 2016-05-10: 20 [IU] via SUBCUTANEOUS
  Filled 2016-05-10: qty 0.2

## 2016-05-10 NOTE — Progress Notes (Signed)
  [facility] Progress Note   Subjective: feeling better  Vitals:   05/09/16 0618 05/09/16 2122 05/10/16 0615 05/10/16 1351  BP: (!) 154/91 (!) 145/81 (!) 156/80 (!) 167/75  Pulse: 70 70 70 65  Resp: 18 19 18 18   Temp: 98.1 F (36.7 C) 97.9 F (36.6 C) 98 F (36.7 C) 98 F (36.7 C)  TempSrc: Oral Oral Oral Oral  SpO2: 98% 100% 95% 96%  Weight:      Height:        Inpatient medications: . amLODipine  10 mg Oral Daily  . dicyclomine  20 mg Oral BID  . heparin  5,000 Units Subcutaneous Q8H  . insulin aspart  0-15 Units Subcutaneous TID WC  . insulin detemir  60 Units Subcutaneous QHS  . metoprolol succinate  50 mg Oral Daily  . pregabalin  100 mg Oral TID   . sodium chloride 100 mL/hr (05/09/16 1739)   lidocaine, metoCLOPramide, oxyCODONE, perflutren lipid microspheres (DEFINITY) IV suspension, polyethylene glycol, promethazine  Exam: Gen alert,very obese adult male No jvd or bruits Chest clear bilat RRR no MRG Abd soft ntnd no mass or ascites +bs very obes MS no joint effusions or deformity Ext 1+ bilat LE pretib edema / no wounds or ulcers Neuro is alert, Ox 3 , nf, no asterixis  Renal US no hydro UA pend  Assessment: 1  Acute on CKD 3 - due to vol depletion. Better, creat down 4 > 2.5, baseline 1.8- 2.2.  [facility] Will sign off.   2  Recurrent N/V - gastroparesis 3  DM2 4  Morbid obesity 5  HTN on 3 bp meds  Vinson Moselleob Lauralyn Shadowens MD [facility] pager [phone]   05/10/2016, 4:15 PM    Recent Labs Lab 05/08/16 2304 05/09/16 0257 05/10/16 0455  NA 134* 134* 138  K 3.3* 3.7 3.5  CL 98* 99* 104  CO2 25 24 27   GLUCOSE 268* 309* 300*  BUN 32* 34* 28*  CREATININE 4.58* 4.33* 2.57*  CALCIUM 8.5* 8.7* 8.4*    Recent Labs Lab 05/04/16 1920 05/10/16 0455  AST 19 11*  ALT 13* 11*  ALKPHOS 100 73  BILITOT 0.8 0.3  PROT 7.7 5.8*  ALBUMIN 3.4* 2.6*    Recent Labs Lab 05/06/16 1018  05/06/16 1039 05/08/16 2304 05/09/16 0257  WBC 13.7*  --  9.8 10.7*  NEUTROABS 12.2*  --   --   --   HGB 13.9 15.6 13.5 13.2  HCT 41.3 46.0 40.0 39.5  MCV 82.6  --  82.1 81.8  PLT 275  --  226 206   Iron/TIBC/Ferritin/ %Sat No results found for: IRON, TIBC, FERRITIN, IRONPCTSAT

## 2016-05-10 NOTE — Progress Notes (Signed)
PROGRESS NOTE  Kenneth Fox  ZOX:[id number] DOB: Feb 24, 1971 DOA: 05/09/2016 PCP: Mike CrazeLONG,[name], PA-C  Outpatient Specialists: Nephrology, [name]  Brief Narrative: Kenneth Fox is a 45 y.o. male with history of poorly-controlled IDT2DM, CHF, HTN. He presented late 12/1 for bilateral LE swelling in the setting of 3 days of unremitting nausea and vomiting attributed to gastroparesis (many ED visits and some admissions with similar presentation). He's not been taking medications during this time. Creatinine was noted to be 4.5 up from 1.8 just 2 days prior. IV fluids were administered with improvement in creatinine.   Assessment & Plan: Principal Problem:   Acute kidney injury (HCC) Active Problems:   Essential hypertension   Diabetic gastroparesis (HCC)   Uncontrolled type 2 diabetes mellitus with diabetic neuropathy, with long-term current use of insulin (HCC)  Acute renal failure: Cr 4.58 on admission from 1.8 2 days prior. Baseline SCr since AKI Dec 2016 has been ~1.5-1.7, prior to that 1.1 with AKI in 2015 to SCr 2.9. ?prerenal due to dehydration due to vomiting but BUN/Cr ratio not typical. Renal U/S with large kidneys and normal echogenicity without hydro. Postvoid residual negative. No longer taking ARB (presumed cause of previous AKI).  - Improved with rehydration, continue IVF's, recheck in AM - Hydralazine stopped due to ?AIN - Repeat echo pending: Dec 2016 showed systolic flattening of ventricular septum and RV dilation with moderately reduced systolic function. BNP wnl in morbidly obese male.  - TED hose for LE swelling.  HTN: Chronic, stable.  - Continue home meds except hydralazine  Diabetic gastroparesis: Suspected cause of N/V though previous ED visit also showed ketonuria and anion gap of 23 without acidosis so hyperglycemia likely contributing - Continue reglan  IDT2DM: Chronic, poorly-controlled.  - Holding metformin. - Need to ensure he can check CBGs at  home following discharge. Reports he's out of test strips. - Give levemir 20u now and continue 60u qHS (takes 80u qHS) - cautious to avoid stacking with AKI. - Mod scale SSI AC  DVT prophylaxis: Subcutaneous heparin Code Status: Full Family Communication: Family at bedside this AM Disposition Plan: Inpatient management of acute renal failure, anticipate DC in AM if tolerating diet and creatinine improved.   Consultants:   Nephrology, Dr. Arlean HoppingSchertz  Procedures:  Renal US 12/2: 12.5cm / 11.8cm normal echogenicity and no hydronephrosis.  Echocardiogram 12/3 pending  Antimicrobials:  None   Subjective: Still having trouble sleeping, eating weill, sugars up. Urinating a lot. Ate breakfast without N/V.   Objective: Vitals:   05/09/16 0401 05/09/16 0618 05/09/16 2122 05/10/16 0615  BP: (!) 147/84 (!) 154/91 (!) 145/81 (!) 156/80  Pulse: 70 70 70 70  Resp: 16 18 19 18   Temp: 97.5 F (36.4 C) 98.1 F (36.7 C) 97.9 F (36.6 C) 98 F (36.7 C)  TempSrc: Oral Oral Oral Oral  SpO2: 97% 98% 100% 95%  Weight: (!) 144.9 kg (319 lb 8 oz)     Height: 5\' 11"  (1.803 m)       Intake/Output Summary (Last 24 hours) at 05/10/16 0935 Last data filed at 05/10/16 0100  Gross per 24 hour  Intake              735 ml  Output                0 ml  Net              735 ml   Filed Weights   05/09/16 0401  Weight: [name](!)  144.9 kg (319 lb 8 oz)    Examination: General exam: Morbidly obese male in no distress snoring very loudly while sleeping Respiratory system: Non-labored breathing ambient air. Clear to auscultation bilaterally.  Cardiovascular system: Regular rate and rhythm. No murmur, rub, or gallop. Unable to appreciate JVD. 2+ pitting bilateral LE edema. No UE edema.  Gastrointestinal system: Abdomen obese, soft, non-tender, non-distended, with normoactive bowel sounds. No organomegaly or masses felt. Central nervous system: Alert and oriented. No focal neurological deficits. Extremities:  Warm, no deformities Skin: No rashes, lesions no ulcers Psychiatry: Judgement and insight appear normal. Mood & affect appropriate.   Data Reviewed: I have personally reviewed following labs and imaging studies  CBC:  Recent Labs Lab 05/04/16 1920 05/06/16 1018 05/06/16 1039 05/08/16 2304 05/09/16 0257  WBC 10.9* 13.7*  --  9.8 10.7*  NEUTROABS  --  12.2*  --   --   --   HGB 14.3 13.9 15.6 13.5 13.2  HCT 41.2 41.3 46.0 40.0 39.5  MCV 80.8 82.6  --  82.1 81.8  PLT 261 275  --  226 206   Basic Metabolic Panel:  Recent Labs Lab 05/04/16 1920 05/06/16 1018 05/06/16 1039 05/08/16 2304 05/09/16 0257 05/10/16 0455  NA 139  --  140 134* 134* 138  K 3.8  --  3.6 3.3* 3.7 3.5  CL 106  --  101 98* 99* 104  CO2 24  --   --  25 24 27   GLUCOSE 260*  --  350* 268* 309* 300*  BUN 13  --  16 32* 34* 28*  CREATININE 1.51*  --  1.80* 4.58* 4.33* 2.57*  CALCIUM 9.0  --   --  8.5* 8.7* 8.4*  MG  --  2.1  --   --   --   --    GFR: Estimated Creatinine Clearance: 51.8 mL/min (by C-G formula based on SCr of 2.57 mg/dL (H)). Liver Function Tests:  Recent Labs Lab 05/04/16 1920 05/10/16 0455  AST 19 11*  ALT 13* 11*  ALKPHOS 100 73  BILITOT 0.8 0.3  PROT 7.7 5.8*  ALBUMIN 3.4* 2.6*    Recent Labs Lab 05/04/16 1920  LIPASE 47   No results for input(s): AMMONIA in the last 168 hours. Coagulation Profile: No results for input(s): INR, PROTIME in the last 168 hours. Cardiac Enzymes: No results for input(s): CKTOTAL, CKMB, CKMBINDEX, TROPONINI in the last 168 hours. BNP (last 3 results) No results for input(s): PROBNP in the last 8760 hours. HbA1C: No results for input(s): HGBA1C in the last 72 hours. CBG:  Recent Labs Lab 05/09/16 0825 05/09/16 1135 05/09/16 1636 05/09/16 2120 05/10/16 0743  GLUCAP 309* 320* 266* 221* 257*   Lipid Profile: No results for input(s): CHOL, HDL, LDLCALC, TRIG, CHOLHDL, LDLDIRECT in the last 72 hours. Thyroid Function Tests: No  results for input(s): TSH, T4TOTAL, FREET4, T3FREE, THYROIDAB in the last 72 hours. Anemia Panel: No results for input(s): VITAMINB12, FOLATE, FERRITIN, TIBC, IRON, RETICCTPCT in the last 72 hours. Urine analysis:    Component Value Date/Time   COLORURINE STRAW (A) 05/09/2016 2252   APPEARANCEUR CLEAR 05/09/2016 2252   LABSPEC 1.013 05/09/2016 2252   PHURINE 5.0 05/09/2016 2252   GLUCOSEU 250 (A) 05/09/2016 2252   HGBUR TRACE (A) 05/09/2016 2252   BILIRUBINUR NEGATIVE 05/09/2016 2252   KETONESUR NEGATIVE 05/09/2016 2252   PROTEINUR >300 (A) 05/09/2016 2252   UROBILINOGEN 1.0 01/17/2015 2230   NITRITE NEGATIVE 05/09/2016 2252   LEUKOCYTESUR NEGATIVE  05/09/2016 2252   Sepsis Labs: @LABRCNTIP (procalcitonin:4,lacticidven:4)  )No results found for this or any previous visit (from the past 240 hour(s)).   Radiology Studies: [place] Renal  Result Date: 05/09/2016 CLINICAL DATA:  Initial evaluation for acute renal injury. EXAM: RENAL / URINARY TRACT ULTRASOUND COMPLETE COMPARISON:  Prior CT from 12/11/2015 FINDINGS: Right Kidney: Length: 12.5 cm. Echogenicity within normal limits. No mass or hydronephrosis visualized. Left Kidney: Length: 11.8 cm. Echogenicity within normal limits. No mass or hydronephrosis visualized. Bladder: Appears normal for degree of bladder distention. IMPRESSION: Normal renal ultrasound.  No hydronephrosis. Electronically Signed   By: Rise MuBenjamin  McClintock M.D.   On: 05/09/2016 03:25    Scheduled Meds: . amLODipine  10 mg Oral Daily  . dicyclomine  20 mg Oral BID  . heparin  5,000 Units Subcutaneous Q8H  . insulin aspart  0-15 Units Subcutaneous TID WC  . insulin detemir  20 Units Subcutaneous Once  . insulin detemir  40 Units Subcutaneous QHS  . metoprolol succinate  50 mg Oral Daily  . pregabalin  100 mg Oral TID   Continuous Infusions: . sodium chloride 100 mL/hr (05/09/16 1739)     LOS: 1 day   Time spent: 25 minutes.  Hazeline Junkeryan Breanne Olvera, MD [facility] Pager [phone]  If 7PM-7AM, please contact night-coverage [email or web address] Password [email or web address] 05/10/2016, 9:35 AM

## 2016-05-11 LAB — BASIC METABOLIC PANEL
Anion gap: 6 (ref 5–15)
BUN: 17 mg/dL (ref 6–20)
CALCIUM: 8.8 mg/dL — AB (ref 8.9–10.3)
CO2: 29 mmol/L (ref 22–32)
CREATININE: 1.91 mg/dL — AB (ref 0.61–1.24)
Chloride: 107 mmol/L (ref 101–111)
GFR, EST AFRICAN AMERICAN: 47 mL/min — AB (ref >60)
GFR, EST NON AFRICAN AMERICAN: 40 mL/min — AB (ref >60)
Glucose, Bld: 254 mg/dL — ABNORMAL HIGH (ref 65–99)
Potassium: 3.6 mmol/L (ref 3.5–5.1)
Sodium: 142 mmol/L (ref 135–145)

## 2016-05-11 LAB — GLUCOSE, CAPILLARY: GLUCOSE-CAPILLARY: 171 mg/dL — AB (ref 65–99)

## 2016-05-11 MED ORDER — BLOOD GLUCOSE METER KIT: PACK | 0 refills | Status: DC

## 2016-05-11 NOTE — Care Management Note (Signed)
Case Management Note  Patient Details  Name: Kenneth Fox E Gladhill MRN: [id number] Date of Birth: [birth year]  Subjective/Objective:                  Admitted with  AKI, history of poorly-controlled IDT2DM, CHF, HTN. He presented late 12/1 for bilateral LE swelling in the setting of 3 days of unremitting nausea and vomiting attributed to gastroparesis (many ED visits and some admissions with similar presentation). From home with wife. States needs some assistance with ADL's (bathing/dressing) which wife helps with. Pt is on home oxygen which [facility] is the provider.   Glenis SmokerShakima Etzkorn (Spouse)     [phone]             PCP:  [name] Long  Action/Plan: Plan is to d/c to home today with home health services (RN).  Expected Discharge Date:    05/11/2016              Expected Discharge Plan:  Home/Self Care  In-House Referral:     Discharge planning Services  CM Consult  Post Acute Care Choice:  Resumption of Svcs/PTA Provider, Durable Medical Equipment Choice offered to:  Patient  DME Arranged:  Oxygen DME Agency:  [facility]  [facility]  Referral made with Vickie @ [phone]- [phone]  Status of Service:  Completed, signed off  If discussed at Long Length of Stay Meetings, dates discussed:    Additional Comments:  Epifanio LeschesCole, Martavis Gurney Hudson, RN 05/11/2016, 11:28 AM

## 2016-05-11 NOTE — Progress Notes (Signed)
Inpatient Diabetes Program Recommendations  AACE/ADA: New Consensus Statement on Inpatient Glycemic Control (2015)  Target Ranges:  Prepandial:   less than 140 mg/dL      Peak postprandial:   less than 180 mg/dL (1-2 hours)      Critically ill patients:  140 - 180 mg/dL   Results for Kenneth Fox, [name] (MRN [id number]) as of 05/11/2016 11:13  Ref. Range 05/10/2016 07:43 05/10/2016 11:40 05/10/2016 16:51 05/10/2016 22:38 05/11/2016 08:04  Glucose-Capillary Latest Ref Range: 65 - 99 mg/dL 409257 (H) 811374 (H) 914198 (H) 328 (H) 171 (H)   Review of Glycemic Control  Diabetes history: DM 2 Outpatient Diabetes medications: Levemir 80 units, Novolog 10-20 units TID, Metformin 500 mg TID Current orders for Inpatient glycemic control: Levemir 60, Novolog Moderate TID  Inpatient Diabetes Program Recommendations:   Glucose increased around meal times. If patient is not discharged, please consider Novolog 3 units meal coverage TID.   Thanks,  Christena DeemShannon Oluwateniola Leitch RN, MSN, [facility] Inpatient Diabetes Coordinator Team Pager [phone] (8a-5p)

## 2016-05-11 NOTE — Discharge Summary (Signed)
Physician Discharge Summary  Kenneth Fox MGN:[id number] DOB: [birth year] DOA: 05/09/2016  PCP: Ledell Noss  Admit date: 05/09/2016 Discharge date: 05/11/2016  Admitted From: Home Disposition: Home   Recommendations for Outpatient Follow-up:  1. Follow up with nephrology in 1 - 2 weeks 2. Follow up with PCP in 1 week for diabetes management. 3. Told to hold lasix and potassium until follow up due to admission for dehydration and preserved EF.  Home Health: None Equipment/Devices: None  Discharge Condition: Stable CODE STATUS: Full Diet recommendation: Carbohydrate modified, heart healthy  Brief/Interim Summary: Rolin Schult Fischeris a 45 y.o.malewith history of poorly-controlled IDT2DM, CHF, HTN. He presented late 12/1 for bilateral LE swelling in the setting of 3 days of unremitting nausea and vomiting attributed to gastroparesis (many ED visits and some admissions with similar presentation). He's not been taking medications during this time. Creatinine was noted to be 4.5 up from 1.8 just 2 days prior. IV fluids were administered with improvement in creatinine.   Discharge Diagnoses:  Principal Problem:   Acute kidney injury ([place]) Active Problems:   Essential hypertension   Diabetic gastroparesis ([place])   Uncontrolled type 2 diabetes mellitus with diabetic neuropathy, with long-term current use of insulin ([facility])  Acute renal failure: Cr 4.58 on admission from 1.8 2 days prior. Baseline SCr since AKI Dec 2016 has been ~1.5-1.7, prior to that 1.1 with AKI in 2015 to SCr 2.9. Suspected this to be prerenal due to dehydration due to vomiting. Renal U/S with large kidneys and normal echogenicity without hydro. Postvoid residual negative. No longer taking ARB (presumed cause of previous AKI).  - Improved with rehydration  - Hydralazine stopped due to ?AIN; ok to restart - Repeat echo limited due to habitus; EF 55-60%, mild LVH. No remark on diastolic function. Dec 9169 showed  systolic flattening of ventricular septum and RV dilation with moderately reduced systolic function. BNP wnl in morbidly obese male.  - TED hose for LE swelling.  HTN: Chronic, stable.  - Continue home meds, will need titration as outpatient  Diabetic gastroparesis: Suspected cause of N/V though previous ED visit also showed ketonuria and anion gap of 23 without acidosis so hyperglycemia likely contributing - Continue reglan  IDT2DM: Chronic, poorly-controlled which is contributing to recurrent dehydration.  - Holding metformin. - Prescribe glucometer, strips, and lancets at discharge. Will record values and take to PCP for insulin titration.  Discharge Instructions Discharge Instructions    Call MD for:  difficulty breathing, headache or visual disturbances    Complete by:  As directed    Call MD for:  extreme fatigue    Complete by:  As directed    Call MD for:  persistant dizziness or light-headedness    Complete by:  As directed    Call MD for:  persistant nausea and vomiting    Complete by:  As directed    Call MD for:  temperature >100.4    Complete by:  As directed    Diet - low sodium heart healthy    Complete by:  As directed    Discharge instructions    Complete by:  As directed    You were admitted with acute renal failure due to dehydration. This has resolved with treatment and you are ready for discharge with the following recommendations:  - Fill the prescription for glucometer, strips, and lancets. Check your blood sugar fasting in the morning, and before each meal (4 times per day) - Record these values and take this  record to your PCP in the next 1 week. - Return if you are unable to take your medications or symptoms return.   Increase activity slowly    Complete by:  As directed        Medication List    STOP taking these medications   furosemide 40 MG tablet Commonly known as:  LASIX   potassium chloride 20 MEQ packet Commonly known as:  KLOR-CON      TAKE these medications   amLODipine 10 MG tablet Commonly known as:  NORVASC Take 10 mg by mouth daily.   blood glucose meter kit and supplies Dispense based on patient and insurance preference. Use up to four times daily as directed. (FOR ICD-9 250.00, 250.01).   dicyclomine 20 MG tablet Commonly known as:  BENTYL Take 1 tablet (20 mg total) by mouth 2 (two) times daily.   hydrALAZINE 50 MG tablet Commonly known as:  APRESOLINE Take 1 tablet (50 mg total) by mouth 3 (three) times daily.   insulin aspart 100 UNIT/ML injection Commonly known as:  novoLOG Inject 10-20 Units into the skin 3 (three) times daily with meals. per sliding scale CBG 70 - 120: 0 units CBG 121 - 150: 2 units CBG 151 - 200: 3 units CBG 201 - 250: 5 units CBG 251 - 300: 8 units CBG 301 - 350: 11 units CBG 351 - 400: 15 units   insulin detemir 100 UNIT/ML injection Commonly known as:  LEVEMIR Inject 0.8 mLs (80 Units total) into the skin every evening.   lidocaine 5 % ointment Commonly known as:  XYLOCAINE Apply 1 application topically 2 (two) times daily as needed for pain.   LYRICA 100 MG capsule Generic drug:  pregabalin Take 100 mg by mouth 3 (three) times daily.   metFORMIN 500 MG tablet Commonly known as:  GLUCOPHAGE Take 1 tablet (500 mg total) by mouth 3 (three) times daily.   metoCLOPramide 10 MG tablet Commonly known as:  REGLAN Take 1 tablet (10 mg total) by mouth every 8 (eight) hours as needed for nausea or vomiting.   metoprolol succinate 50 MG 24 hr tablet Commonly known as:  TOPROL-XL Take 50 mg by mouth daily.   Oxycodone HCl 10 MG Tabs Take 1 tablet by mouth every 6 (six) hours as needed (pain).   polyethylene glycol powder powder Commonly known as:  GLYCOLAX/MIRALAX Take 17 g by mouth daily as needed for constipation.   promethazine 12.5 MG tablet Commonly known as:  PHENERGAN Take 1 tablet (12.5 mg total) by mouth every 6 (six) hours as needed for nausea or vomiting.       Follow-up Information    LONG,[name], PA-C. Schedule an appointment as soon as possible for a visit in 1 week(s).   Specialty:  Physician Assistant Contact information: [place] [address] [phone]        Louis Meckel, MD. Schedule an appointment as soon as possible for a visit.   Specialty:  Nephrology Contact information: [place] [address] [phone]        [facility] Follow up.   Specialty:  Home Health Services Why:  home health RN arranged Contact information: [address] [place] [address] [phone]          Allergies  Allergen Reactions  . Angiotensin Receptor Blockers Other (See Comments)    Acute renal failure in patient w R heart failure  . Lisinopril Other (See Comments)  Patient developed AKI after being on lisinopril for a week.    Consultations:  Nephrology, Dr. Jonnie Finner  Procedures/Studies: US Renal  Result Date: 05/09/2016 CLINICAL DATA:  Initial evaluation for acute renal injury. EXAM: RENAL / URINARY TRACT ULTRASOUND COMPLETE COMPARISON:  Prior CT from 12/11/2015 FINDINGS: Right Kidney: Length: 12.5 cm. Echogenicity within normal limits. No mass or hydronephrosis visualized. Left Kidney: Length: 11.8 cm. Echogenicity within normal limits. No mass or hydronephrosis visualized. Bladder: Appears normal for degree of bladder distention. IMPRESSION: Normal renal ultrasound.  No hydronephrosis. Electronically Signed   By: Jeannine Boga M.D.   On: 05/09/2016 03:25     Echocardiogram 12/3:  Study Conclusions  - Procedure narrative: Transthoracic echocardiography. Image   quality was suboptimal. The study was technically difficult, as a   result of poor acoustic windows, poor sound wave transmission,   and body habitus. Intravenous contrast (Definity) was   administered. - Left ventricle: The cavity size was normal. Wall thickness was   increased in a  pattern of mild LVH. Systolic function was normal.   The estimated ejection fraction was in the range of 55% to 60%.   Wall motion was normal; there were no regional wall motion   abnormalities. The study is not technically sufficient to allow   evaluation of LV diastolic function. - Left atrium: The atrium was normal in size.  Impressions:  - Very difficult study, despite Definity contrast. Grossly normal   LVEF of 55-60%, mild LVH, normal LA size.  Subjective: Pt feels well, wants to go home. Urinating normally. No complaints.   Discharge Exam: Vitals:   05/10/16 2240 05/11/16 0613  BP: (!) 171/88 (!) 168/95  Pulse: 69 76  Resp: 18 18  Temp: 98.1 F (36.7 C) 98.1 F (36.7 C)   General: Pt is obese, in no acute distress Cardiovascular: RRR, S1/S2 +, no rubs, no gallops Respiratory: Nonlabored, CTA bilaterally, no wheezing, no rhonchi Abdominal: Soft, NT, ND, bowel sounds + Extremities: 2+ bilateral LE pitting edema  The results of significant diagnostics from this hospitalization (including imaging, microbiology, ancillary and laboratory) are listed below for reference.    Labs: BNP (last 3 results)  Recent Labs  06/27/15 1155 02/20/16 1703 05/08/16 2305  BNP 121.2* 20.0 79.4   Basic Metabolic Panel:  Recent Labs Lab 05/04/16 1920 05/06/16 1018 05/06/16 1039 05/08/16 2304 05/09/16 0257 05/10/16 0455 05/11/16 1040  NA 139  --  140 134* 134* 138 142  K 3.8  --  3.6 3.3* 3.7 3.5 3.6  CL 106  --  101 98* 99* 104 107  CO2 24  --   --  _0 GLUCOSE 260*  --  350* 268* 309* 300* 254*  BUN 13  --  16 32* 34* 28* 17  CREATININE 1.51*  --  1.80* 4.58* 4.33* 2.57* 1.91*  CALCIUM 9.0  --   --  8.5* 8.7* 8.4* 8.8*  MG  --  2.1  --   --   --   --   --    Liver Function Tests:  Recent Labs Lab 05/04/16 1920 05/10/16 0455  AST 19 11*  ALT 13* 11*  ALKPHOS 100 73  BILITOT 0.8 0.3  PROT 7.7 5.8*  ALBUMIN 3.4* 2.6*    Recent Labs Lab  05/04/16 1920  LIPASE 47   CBC:  Recent Labs Lab 05/04/16 1920 05/06/16 1018 05/06/16 1039 05/08/16 2304 05/09/16 0257  WBC 10.9* 13.7*  --  9.8 10.7*  NEUTROABS  --  12.2*  --   --   --   HGB 14.3 13.9 15.6 13.5 13.2  HCT 41.2 41.3 46.0 40.0 39.5  MCV 80.8 82.6  --  82.1 81.8  PLT 261 275  --  226 206   CBG:  Recent Labs Lab 05/10/16 0743 05/10/16 1140 05/10/16 1651 05/10/16 2238 05/11/16 0804  GLUCAP 257* 374* 198* 328* 171*   Urinalysis    Component Value Date/Time   COLORURINE STRAW (A) 05/09/2016 2252   APPEARANCEUR CLEAR 05/09/2016 2252   LABSPEC 1.013 05/09/2016 2252   PHURINE 5.0 05/09/2016 2252   GLUCOSEU 250 (A) 05/09/2016 2252   HGBUR TRACE (A) 05/09/2016 2252   BILIRUBINUR NEGATIVE 05/09/2016 2252   KETONESUR NEGATIVE 05/09/2016 2252   PROTEINUR >300 (A) 05/09/2016 2252   UROBILINOGEN 1.0 01/17/2015 2230   NITRITE NEGATIVE 05/09/2016 2252   LEUKOCYTESUR NEGATIVE 05/09/2016 2252   Time coordinating discharge: Over 30 minutes  Vance Gather, MD  [facility] 05/11/2016, 3:58 PM Pager [phone]  If 7PM-7AM, please contact night-coverage [email or web address] Password [email or web address]

## 2016-05-11 NOTE — Discharge Instructions (Signed)
Do not take lasix or potassium for the next few days until you follow up with your PCP.

## 2016-06-17 ENCOUNTER — Ambulatory Visit: Payer: Medicare Other | Admitting: *Deleted

## 2016-10-12 ENCOUNTER — Encounter (HOSPITAL_COMMUNITY): Payer: Self-pay | Admitting: *Deleted

## 2016-10-12 ENCOUNTER — Emergency Department (HOSPITAL_COMMUNITY): Payer: Medicare Other

## 2016-10-12 DIAGNOSIS — Z794 Long term (current) use of insulin: Secondary | ICD-10-CM

## 2016-10-12 DIAGNOSIS — Z9119 Patient's noncompliance with other medical treatment and regimen: Secondary | ICD-10-CM

## 2016-10-12 DIAGNOSIS — Z6841 Body Mass Index (BMI) 40.0 and over, adult: Secondary | ICD-10-CM

## 2016-10-12 DIAGNOSIS — D573 Sickle-cell trait: Secondary | ICD-10-CM | POA: Diagnosis present

## 2016-10-12 DIAGNOSIS — E876 Hypokalemia: Secondary | ICD-10-CM | POA: Diagnosis present

## 2016-10-12 DIAGNOSIS — N184 Chronic kidney disease, stage 4 (severe): Secondary | ICD-10-CM | POA: Diagnosis present

## 2016-10-12 DIAGNOSIS — K219 Gastro-esophageal reflux disease without esophagitis: Secondary | ICD-10-CM | POA: Diagnosis present

## 2016-10-12 DIAGNOSIS — K3184 Gastroparesis: Secondary | ICD-10-CM | POA: Diagnosis present

## 2016-10-12 DIAGNOSIS — I130 Hypertensive heart and chronic kidney disease with heart failure and stage 1 through stage 4 chronic kidney disease, or unspecified chronic kidney disease: Principal | ICD-10-CM | POA: Diagnosis present

## 2016-10-12 DIAGNOSIS — E1143 Type 2 diabetes mellitus with diabetic autonomic (poly)neuropathy: Secondary | ICD-10-CM | POA: Diagnosis present

## 2016-10-12 DIAGNOSIS — R0902 Hypoxemia: Secondary | ICD-10-CM | POA: Diagnosis present

## 2016-10-12 DIAGNOSIS — G4733 Obstructive sleep apnea (adult) (pediatric): Secondary | ICD-10-CM | POA: Diagnosis present

## 2016-10-12 DIAGNOSIS — J45909 Unspecified asthma, uncomplicated: Secondary | ICD-10-CM | POA: Diagnosis present

## 2016-10-12 DIAGNOSIS — Z888 Allergy status to other drugs, medicaments and biological substances status: Secondary | ICD-10-CM

## 2016-10-12 DIAGNOSIS — Z7984 Long term (current) use of oral hypoglycemic drugs: Secondary | ICD-10-CM

## 2016-10-12 DIAGNOSIS — Z79899 Other long term (current) drug therapy: Secondary | ICD-10-CM

## 2016-10-12 DIAGNOSIS — I5033 Acute on chronic diastolic (congestive) heart failure: Secondary | ICD-10-CM | POA: Diagnosis present

## 2016-10-12 DIAGNOSIS — E1165 Type 2 diabetes mellitus with hyperglycemia: Secondary | ICD-10-CM | POA: Diagnosis present

## 2016-10-12 DIAGNOSIS — E1122 Type 2 diabetes mellitus with diabetic chronic kidney disease: Secondary | ICD-10-CM | POA: Diagnosis present

## 2016-10-12 LAB — BASIC METABOLIC PANEL
Anion gap: 11 (ref 5–15)
BUN: 17 mg/dL (ref 6–20)
CALCIUM: 8.7 mg/dL — AB (ref 8.9–10.3)
CO2: 24 mmol/L (ref 22–32)
CREATININE: 2.05 mg/dL — AB (ref 0.61–1.24)
Chloride: 104 mmol/L (ref 101–111)
GFR calc Af Amer: 43 mL/min — ABNORMAL LOW (ref >60)
GFR calc non Af Amer: 37 mL/min — ABNORMAL LOW (ref >60)
Glucose, Bld: 208 mg/dL — ABNORMAL HIGH (ref 65–99)
Potassium: 3.8 mmol/L (ref 3.5–5.1)
SODIUM: 139 mmol/L (ref 135–145)

## 2016-10-12 LAB — CBC
HCT: 40.8 % (ref 39.0–52.0)
Hemoglobin: 13.2 g/dL (ref 13.0–17.0)
MCH: 27.2 pg (ref 26.0–34.0)
MCHC: 32.4 g/dL (ref 30.0–36.0)
MCV: 84 fL (ref 78.0–100.0)
PLATELETS: 247 10*3/uL (ref 150–400)
RBC: 4.86 MIL/uL (ref 4.22–5.81)
RDW: 15.4 % (ref 11.5–15.5)
WBC: 8.7 10*3/uL (ref 4.0–10.5)

## 2016-10-12 LAB — I-STAT TROPONIN, ED: TROPONIN I, POC: 0.01 ng/mL (ref 0.00–0.08)

## 2016-10-12 LAB — CBG MONITORING, ED: Glucose-Capillary: 224 mg/dL — ABNORMAL HIGH (ref 65–99)

## 2016-10-12 LAB — BRAIN NATRIURETIC PEPTIDE: B Natriuretic Peptide: 55.9 pg/mL (ref 0.0–100.0)

## 2016-10-12 NOTE — ED Triage Notes (Signed)
Pt sent here by pcp for O2 sats in 80's and fluid in his lungs.  Upon arrival, sats were 96%, though breathing labored.  Pt normally on 2-3 L [place], but was not wearing upon arrival.  States lasix recently decreased d/t kidney function issues and now is experiencing swelling to all extremities.

## 2016-10-13 ENCOUNTER — Inpatient Hospital Stay (HOSPITAL_COMMUNITY)
Admission: EM | Admit: 2016-10-13 | Discharge: 2016-10-15 | DRG: 291 | Disposition: A | Discharge status: Home Health | Payer: Medicare Other | Attending: Internal Medicine | Admitting: Internal Medicine

## 2016-10-13 ENCOUNTER — Encounter (HOSPITAL_COMMUNITY): Payer: Self-pay | Admitting: Internal Medicine

## 2016-10-13 DIAGNOSIS — Z794 Long term (current) use of insulin: Secondary | ICD-10-CM

## 2016-10-13 DIAGNOSIS — Z6841 Body Mass Index (BMI) 40.0 and over, adult: Secondary | ICD-10-CM | POA: Diagnosis not present

## 2016-10-13 DIAGNOSIS — E1140 Type 2 diabetes mellitus with diabetic neuropathy, unspecified: Secondary | ICD-10-CM

## 2016-10-13 DIAGNOSIS — E1122 Type 2 diabetes mellitus with diabetic chronic kidney disease: Secondary | ICD-10-CM | POA: Diagnosis not present

## 2016-10-13 DIAGNOSIS — R609 Edema, unspecified: Secondary | ICD-10-CM

## 2016-10-13 DIAGNOSIS — E1165 Type 2 diabetes mellitus with hyperglycemia: Secondary | ICD-10-CM | POA: Diagnosis not present

## 2016-10-13 DIAGNOSIS — K219 Gastro-esophageal reflux disease without esophagitis: Secondary | ICD-10-CM | POA: Diagnosis present

## 2016-10-13 DIAGNOSIS — I5033 Acute on chronic diastolic (congestive) heart failure: Secondary | ICD-10-CM | POA: Diagnosis present

## 2016-10-13 DIAGNOSIS — Z9119 Patient's noncompliance with other medical treatment and regimen: Secondary | ICD-10-CM | POA: Diagnosis not present

## 2016-10-13 DIAGNOSIS — I509 Heart failure, unspecified: Secondary | ICD-10-CM

## 2016-10-13 DIAGNOSIS — D573 Sickle-cell trait: Secondary | ICD-10-CM | POA: Diagnosis not present

## 2016-10-13 DIAGNOSIS — E876 Hypokalemia: Secondary | ICD-10-CM | POA: Diagnosis present

## 2016-10-13 DIAGNOSIS — Z9989 Dependence on other enabling machines and devices: Secondary | ICD-10-CM

## 2016-10-13 DIAGNOSIS — K3184 Gastroparesis: Secondary | ICD-10-CM | POA: Diagnosis not present

## 2016-10-13 DIAGNOSIS — Z79899 Other long term (current) drug therapy: Secondary | ICD-10-CM | POA: Diagnosis not present

## 2016-10-13 DIAGNOSIS — IMO0002 Reserved for concepts with insufficient information to code with codable children: Secondary | ICD-10-CM

## 2016-10-13 DIAGNOSIS — E119 Type 2 diabetes mellitus without complications: Secondary | ICD-10-CM

## 2016-10-13 DIAGNOSIS — E8770 Fluid overload, unspecified: Secondary | ICD-10-CM | POA: Diagnosis present

## 2016-10-13 DIAGNOSIS — R0602 Shortness of breath: Secondary | ICD-10-CM

## 2016-10-13 DIAGNOSIS — N184 Chronic kidney disease, stage 4 (severe): Secondary | ICD-10-CM | POA: Diagnosis not present

## 2016-10-13 DIAGNOSIS — R600 Localized edema: Secondary | ICD-10-CM

## 2016-10-13 DIAGNOSIS — I10 Essential (primary) hypertension: Secondary | ICD-10-CM | POA: Diagnosis not present

## 2016-10-13 DIAGNOSIS — G4733 Obstructive sleep apnea (adult) (pediatric): Secondary | ICD-10-CM | POA: Diagnosis present

## 2016-10-13 DIAGNOSIS — Z7984 Long term (current) use of oral hypoglycemic drugs: Secondary | ICD-10-CM | POA: Diagnosis not present

## 2016-10-13 DIAGNOSIS — Z888 Allergy status to other drugs, medicaments and biological substances status: Secondary | ICD-10-CM | POA: Diagnosis not present

## 2016-10-13 DIAGNOSIS — I130 Hypertensive heart and chronic kidney disease with heart failure and stage 1 through stage 4 chronic kidney disease, or unspecified chronic kidney disease: Secondary | ICD-10-CM | POA: Diagnosis not present

## 2016-10-13 DIAGNOSIS — J45909 Unspecified asthma, uncomplicated: Secondary | ICD-10-CM | POA: Diagnosis not present

## 2016-10-13 DIAGNOSIS — R0902 Hypoxemia: Secondary | ICD-10-CM | POA: Diagnosis not present

## 2016-10-13 DIAGNOSIS — E1143 Type 2 diabetes mellitus with diabetic autonomic (poly)neuropathy: Secondary | ICD-10-CM | POA: Diagnosis not present

## 2016-10-13 LAB — BASIC METABOLIC PANEL
Anion gap: 7 (ref 5–15)
BUN: 13 mg/dL (ref 6–20)
CALCIUM: 8.4 mg/dL — AB (ref 8.9–10.3)
CO2: 29 mmol/L (ref 22–32)
Chloride: 105 mmol/L (ref 101–111)
Creatinine, Ser: 1.93 mg/dL — ABNORMAL HIGH (ref 0.61–1.24)
GFR calc Af Amer: 46 mL/min — ABNORMAL LOW (ref >60)
GFR, EST NON AFRICAN AMERICAN: 40 mL/min — AB (ref >60)
Glucose, Bld: 219 mg/dL — ABNORMAL HIGH (ref 65–99)
POTASSIUM: 3.5 mmol/L (ref 3.5–5.1)
SODIUM: 141 mmol/L (ref 135–145)

## 2016-10-13 LAB — GLUCOSE, CAPILLARY
GLUCOSE-CAPILLARY: 256 mg/dL — AB (ref 65–99)
Glucose-Capillary: 214 mg/dL — ABNORMAL HIGH (ref 65–99)
Glucose-Capillary: 258 mg/dL — ABNORMAL HIGH (ref 65–99)
Glucose-Capillary: 287 mg/dL — ABNORMAL HIGH (ref 65–99)
Glucose-Capillary: 159 mg/dL — ABNORMAL HIGH (ref 65–99)

## 2016-10-13 LAB — HIV ANTIBODY (ROUTINE TESTING W REFLEX): HIV SCREEN 4TH GENERATION: NONREACTIVE

## 2016-10-13 LAB — CBC
HEMATOCRIT: 40 % (ref 39.0–52.0)
Hemoglobin: 12.6 g/dL — ABNORMAL LOW (ref 13.0–17.0)
MCH: 26.5 pg (ref 26.0–34.0)
MCHC: 31.5 g/dL (ref 30.0–36.0)
MCV: 84.2 fL (ref 78.0–100.0)
PLATELETS: 247 10*3/uL (ref 150–400)
RBC: 4.75 MIL/uL (ref 4.22–5.81)
RDW: 15.5 % (ref 11.5–15.5)
WBC: 8 10*3/uL (ref 4.0–10.5)

## 2016-10-13 LAB — TSH: TSH: 2.329 u[IU]/mL (ref 0.350–4.500)

## 2016-10-13 LAB — TROPONIN I

## 2016-10-13 LAB — CBG MONITORING, ED: Glucose-Capillary: 205 mg/dL — ABNORMAL HIGH (ref 65–99)

## 2016-10-13 MED ORDER — INSULIN ASPART 100 UNIT/ML ~~LOC~~ SOLN
0.0000 [IU] | Freq: Three times a day (TID) | SUBCUTANEOUS | Status: DC
  Administered 2016-10-13: 11 [IU] via SUBCUTANEOUS
  Administered 2016-10-14 (×2): 4 [IU] via SUBCUTANEOUS
  Administered 2016-10-15: 7 [IU] via SUBCUTANEOUS

## 2016-10-13 MED ORDER — FUROSEMIDE 10 MG/ML IJ SOLN
80.0000 mg | Freq: Two times a day (BID) | INTRAMUSCULAR | Status: DC
  Administered 2016-10-13 – 2016-10-15 (×5): 80 mg via INTRAVENOUS
  Filled 2016-10-13 (×5): qty 8

## 2016-10-13 MED ORDER — METOLAZONE 2.5 MG PO TABS
2.5000 mg | ORAL_TABLET | Freq: Once | ORAL | Status: AC
  Administered 2016-10-13: 2.5 mg via ORAL
  Filled 2016-10-13: qty 1

## 2016-10-13 MED ORDER — HYDRALAZINE HCL 50 MG PO TABS
50.0000 mg | ORAL_TABLET | Freq: Three times a day (TID) | ORAL | Status: DC
  Filled 2016-10-13: qty 1

## 2016-10-13 MED ORDER — PREGABALIN 75 MG PO CAPS
150.0000 mg | ORAL_CAPSULE | Freq: Three times a day (TID) | ORAL | Status: DC
  Administered 2016-10-13 – 2016-10-15 (×7): 150 mg via ORAL
  Filled 2016-10-13 (×7): qty 2

## 2016-10-13 MED ORDER — INSULIN ASPART 100 UNIT/ML ~~LOC~~ SOLN: 0.0000 [IU] | Freq: Every day | SUBCUTANEOUS | Status: DC

## 2016-10-13 MED ORDER — METOPROLOL SUCCINATE ER 50 MG PO TB24
50.0000 mg | ORAL_TABLET | Freq: Every day | ORAL | Status: DC
  Administered 2016-10-13 – 2016-10-15 (×3): 50 mg via ORAL
  Filled 2016-10-13 (×3): qty 1

## 2016-10-13 MED ORDER — HYDROCODONE-ACETAMINOPHEN 5-325 MG PO TABS
1.0000 | ORAL_TABLET | Freq: Four times a day (QID) | ORAL | Status: AC | PRN
  Administered 2016-10-13 – 2016-10-14 (×2): 1 via ORAL
  Filled 2016-10-13 (×2): qty 1

## 2016-10-13 MED ORDER — POTASSIUM CHLORIDE CRYS ER 20 MEQ PO TBCR: 20.0000 meq | EXTENDED_RELEASE_TABLET | Freq: Every day | ORAL | Status: DC

## 2016-10-13 MED ORDER — FUROSEMIDE 10 MG/ML IJ SOLN
80.0000 mg | Freq: Once | INTRAMUSCULAR | Status: AC
  Administered 2016-10-13: 80 mg via INTRAVENOUS
  Filled 2016-10-13: qty 8

## 2016-10-13 MED ORDER — INSULIN DEGLUDEC 200 UNIT/ML ~~LOC~~ SOPN: 50.0000 [IU] | PEN_INJECTOR | Freq: Every evening | SUBCUTANEOUS | Status: DC

## 2016-10-13 MED ORDER — ONDANSETRON HCL 4 MG/2ML IJ SOLN: 4.0000 mg | Freq: Four times a day (QID) | INTRAMUSCULAR | Status: DC | PRN

## 2016-10-13 MED ORDER — ONDANSETRON HCL 4 MG PO TABS: 4.0000 mg | ORAL_TABLET | Freq: Four times a day (QID) | ORAL | Status: DC | PRN

## 2016-10-13 MED ORDER — ACETAMINOPHEN 650 MG RE SUPP: 650.0000 mg | Freq: Four times a day (QID) | RECTAL | Status: DC | PRN

## 2016-10-13 MED ORDER — HEPARIN SODIUM (PORCINE) 5000 UNIT/ML IJ SOLN
5000.0000 [IU] | Freq: Three times a day (TID) | INTRAMUSCULAR | Status: DC
  Administered 2016-10-13 – 2016-10-15 (×6): 5000 [IU] via SUBCUTANEOUS
  Filled 2016-10-13 (×6): qty 1

## 2016-10-13 MED ORDER — POLYETHYLENE GLYCOL 3350 17 G PO PACK
17.0000 g | PACK | Freq: Every day | ORAL | Status: DC
  Administered 2016-10-13 – 2016-10-15 (×2): 17 g via ORAL
  Filled 2016-10-13 (×3): qty 1

## 2016-10-13 MED ORDER — HYDRALAZINE HCL 50 MG PO TABS
100.0000 mg | ORAL_TABLET | Freq: Three times a day (TID) | ORAL | Status: DC
  Administered 2016-10-13 – 2016-10-15 (×7): 100 mg via ORAL
  Filled 2016-10-13 (×7): qty 2

## 2016-10-13 MED ORDER — POTASSIUM CHLORIDE CRYS ER 20 MEQ PO TBCR
20.0000 meq | EXTENDED_RELEASE_TABLET | Freq: Two times a day (BID) | ORAL | Status: DC
  Administered 2016-10-13 – 2016-10-15 (×5): 20 meq via ORAL
  Filled 2016-10-13 (×5): qty 1

## 2016-10-13 MED ORDER — INSULIN ASPART 100 UNIT/ML ~~LOC~~ SOLN
3.0000 [IU] | Freq: Three times a day (TID) | SUBCUTANEOUS | Status: DC
  Administered 2016-10-13 – 2016-10-15 (×4): 3 [IU] via SUBCUTANEOUS

## 2016-10-13 MED ORDER — INSULIN GLARGINE 100 UNIT/ML ~~LOC~~ SOLN
60.0000 [IU] | Freq: Every day | SUBCUTANEOUS | Status: DC
  Administered 2016-10-13 – 2016-10-14 (×2): 60 [IU] via SUBCUTANEOUS
  Filled 2016-10-13 (×3): qty 0.6

## 2016-10-13 MED ORDER — GABAPENTIN 600 MG PO TABS: 600.0000 mg | ORAL_TABLET | Freq: Three times a day (TID) | ORAL | Status: DC

## 2016-10-13 MED ORDER — ACETAMINOPHEN 325 MG PO TABS
650.0000 mg | ORAL_TABLET | Freq: Four times a day (QID) | ORAL | Status: DC | PRN
  Administered 2016-10-13: 650 mg via ORAL
  Filled 2016-10-13: qty 2

## 2016-10-13 MED ORDER — INSULIN ASPART 100 UNIT/ML ~~LOC~~ SOLN
0.0000 [IU] | Freq: Three times a day (TID) | SUBCUTANEOUS | Status: DC
  Administered 2016-10-13 (×2): 5 [IU] via SUBCUTANEOUS

## 2016-10-13 MED ORDER — OXYCODONE HCL 5 MG PO TABS: 10.0000 mg | ORAL_TABLET | Freq: Four times a day (QID) | ORAL | Status: DC | PRN

## 2016-10-13 MED ORDER — PANTOPRAZOLE SODIUM 40 MG PO TBEC
40.0000 mg | DELAYED_RELEASE_TABLET | Freq: Every day | ORAL | Status: DC
  Administered 2016-10-13 – 2016-10-15 (×3): 40 mg via ORAL
  Filled 2016-10-13 (×3): qty 1

## 2016-10-13 NOTE — Progress Notes (Signed)
Patient c/o of Headache unrelieved by Tylenol. Paged NP on call, Hydrocodone ordered. Will continue to monitor.  Tadashi Burkel, RN

## 2016-10-13 NOTE — ED Notes (Signed)
Report given to rn on 3e 

## 2016-10-13 NOTE — ED Provider Notes (Signed)
[facility] DEPT Provider Note   CSN: [id number] Arrival date & time: 10/12/16  1749  By signing my name below, I, Oleh Genin, attest that this documentation has been prepared under the direction and in the presence of Horton, Barbette Hair, MD. Electronically Signed: Oleh Genin, Scribe. 10/13/16. 12:46 AM.   History   Chief Complaint Chief Complaint  Patient presents with  . Shortness of Breath    HPI Kenneth Fox is a 46 y.o. male with history of DM2, CHF, HTN who presents to the ED with hypoxia and peripheral edema. The patient was seen at his primary care office for scheduled visit. While at the office he was hypoxic to 80 percent on his usual oxygen requirement. He was also reporting peripheral edema. They obtained a chest x-ray and transferred to this facility. At interview, he does state that his dyspnea is worse than usual. However no chest pain. He is complaint with his antihypertensives. He is off diuretics For 1 week per physician recommendation. Patient reports that his kidney doctor recommended discontinuing diuretic. Since that time he has had increasing shortness of breath and lower extremity edema.  The history is provided by the patient. No language interpreter was used.  Shortness of Breath  This is a chronic problem. The problem occurs continuously.The problem has been gradually worsening. Associated symptoms include leg swelling. Pertinent negatives include no fever, no cough and no chest pain.    Past Medical History:  Diagnosis Date  . Asthma   . CHF (congestive heart failure) ([place])   . Diabetes ([place])   . Gastroparesis   . GERD (gastroesophageal reflux disease)   . Hypertension   . Kidney disorder   . Neuropathy   . Obesity   . Obstructive sleep apnea    will not wear CPAP  . Renal insufficiency   . Sickle cell trait [facility])     Patient Active Problem List   Diagnosis Date Noted  . Fluid overload 10/13/2016  . Acute kidney injury ([place])  05/09/2016  . Dysphagia 12/12/2015  . Intractable nausea and vomiting 12/11/2015  . Leukocytosis 06/02/2015  . Morbid obesity due to excess calories ([place]) 06/02/2015  . Uncontrolled type 2 diabetes mellitus with diabetic neuropathy, with long-term current use of insulin ([place])   . Elevated troponin 06/01/2015  . AKI (acute kidney injury) ([place]) 12/03/2013  . Adverse reaction to lisinopril 12/03/2013  . Hypokalemia 11/25/2013  . Diabetic gastroparesis ([place]) 11/17/2013  . Obstructive sleep apnea 11/07/2012  . Essential hypertension 05/18/2011    Past Surgical History:  Procedure Laterality Date  . HIP FRACTURE SURGERY Right 1999   "put a plate in"       Home Medications    Prior to Admission medications   Medication Sig Start Date End Date Taking? Authorizing Provider  gabapentin (NEURONTIN) 600 MG tablet Take 600 mg by mouth 3 (three) times daily.   Yes [provider]  hydrALAZINE (APRESOLINE) 50 MG tablet Take 1 tablet (50 mg total) by mouth 3 (three) times daily. 06/06/15  Yes Robbie Lis, MD  insulin aspart (NOVOLOG) 100 UNIT/ML injection Inject 10-20 Units into the skin 3 (three) times daily with meals. per sliding scale CBG 70 - 120: 0 units CBG 121 - 150: 2 units CBG 151 - 200: 3 units CBG 201 - 250: 5 units CBG 251 - 300: 8 units CBG 301 - 350: 11 units CBG 351 - 400: 15 units 06/06/15  Yes Robbie Lis, MD  Insulin Degludec (TRESIBA  FLEXTOUCH) 200 UNIT/ML SOPN Inject 80 Units into the skin every evening.   Yes [provider]  insulin detemir (LEVEMIR) 100 UNIT/ML injection Inject 0.8 mLs (80 Units total) into the skin every evening. 12/13/15  Yes Rai, Ripudeep K, MD  ketoconazole (NIZORAL) 2 % cream Apply 1 application topically daily.   Yes [provider]  lidocaine (XYLOCAINE) 5 % ointment Apply 1 application topically 2 (two) times daily as needed for pain. 04/15/16  Yes [provider]  metFORMIN (GLUCOPHAGE) 500 MG tablet  Take 1 tablet (500 mg total) by mouth 3 (three) times daily. Patient taking differently: Take 500 mg by mouth 2 (two) times daily with a meal.  12/13/15  Yes Rai, Ripudeep K, MD  metoprolol succinate (TOPROL-XL) 50 MG 24 hr tablet Take 50 mg by mouth daily. 10/18/15  Yes [provider]  Oxycodone HCl 10 MG TABS Take 1 tablet by mouth every 6 (six) hours as needed (pain).  04/17/14  Yes [provider]  pantoprazole (PROTONIX) 40 MG tablet Take 40 mg by mouth daily.   Yes [provider]  polyethylene glycol (MIRALAX / GLYCOLAX) packet Take 17 g by mouth daily.   Yes [provider]  potassium chloride SA (K-DUR,KLOR-CON) 20 MEQ tablet Take 20 mEq by mouth daily.   Yes [provider]  pregabalin (LYRICA) 150 MG capsule Take 150 mg by mouth 3 (three) times daily.   Yes [provider]  blood glucose meter kit and supplies Dispense based on patient and insurance preference. Use up to four times daily as directed. (FOR ICD-9 250.00, 250.01). 05/11/16   Patrecia Pour, MD  dicyclomine (BENTYL) 20 MG tablet Take 1 tablet (20 mg total) by mouth 2 (two) times daily. Patient not taking: Reported on 10/13/2016 05/29/15   Comer Locket, PA-C  metoCLOPramide (REGLAN) 10 MG tablet Take 1 tablet (10 mg total) by mouth every 8 (eight) hours as needed for nausea or vomiting. Patient not taking: Reported on 05/09/2016 12/06/15   Clayton Bibles, PA-C  promethazine (PHENERGAN) 12.5 MG tablet Take 1 tablet (12.5 mg total) by mouth every 6 (six) hours as needed for nausea or vomiting. Patient not taking: Reported on 10/13/2016 12/13/15   Mendel Corning, MD    Family History Family History  Problem Relation Age of Onset  . Sickle cell anemia Mother   . Heart attack Mother   . Diabetes Father   . Neuropathy Father   . Hypertension Father   . Aneurysm Father   . Kidney disease Brother   . Kidney disease Sister     Social History Social History  Substance Use  Topics  . Smoking status: Never Smoker  . Smokeless tobacco: Never Used  . Alcohol use No     Allergies   Angiotensin receptor blockers and Lisinopril   Review of Systems Review of Systems  Constitutional: Negative for fever.  Respiratory: Positive for shortness of breath. Negative for cough.   Cardiovascular: Positive for leg swelling. Negative for chest pain.  All other systems reviewed and are negative.    Physical Exam Updated Vital Signs BP (!) 156/71 (BP Location: Right Arm)   Pulse 81   Temp 97.7 F (36.5 C) (Oral)   Resp 16   Ht 5' 11"  (1.803 m)   Wt (!) 336 lb (152.4 kg)   SpO2 90%   BMI 46.86 kg/m   Physical Exam  Constitutional: He is oriented to person, place, and time. No distress.  Morbidly  obese  HENT:  Head: Normocephalic and atraumatic.  Cardiovascular: Normal rate, regular rhythm and normal heart sounds.   Limited by body habitus  Pulmonary/Chest: Breath sounds normal. He has no wheezes.  Mild tachypnea, distant breath sounds, 3 L oxygen in place  Abdominal: Soft. Bowel sounds are normal. There is no tenderness. There is no rebound.  Musculoskeletal: He exhibits edema.  3+ pitting bilateral lower extremity edema  Neurological: He is alert and oriented to person, place, and time.  Skin: Skin is warm and dry.  Psychiatric: He has a normal mood and affect.  Nursing note and vitals reviewed.    ED Treatments / Results  Labs (all labs ordered are listed, but only abnormal results are displayed) Labs Reviewed  BASIC METABOLIC PANEL - Abnormal; Notable for the following:       Result Value   Glucose, Bld 208 (*)    Creatinine, Ser 2.05 (*)    Calcium 8.7 (*)    GFR calc non Af Amer 37 (*)    GFR calc Af Amer 43 (*)    All other components within normal limits  CBG MONITORING, ED - Abnormal; Notable for the following:    Glucose-Capillary 224 (*)    All other components within normal limits  CBG MONITORING, ED - Abnormal; Notable for the  following:    Glucose-Capillary 205 (*)    All other components within normal limits  CBC  BRAIN NATRIURETIC PEPTIDE  I-STAT TROPOININ, ED    EKG  EKG Interpretation None       Radiology Dg Chest 2 View  Result Date: 10/12/2016 CLINICAL DATA:  Shortness of breath for 8 months EXAM: CHEST  2 VIEW COMPARISON:  February 20, 2016, June 27, 2015, March 08, 2014 FINDINGS: The heart size and mediastinal contours are stable. The heart size is enlarged. There is opacity of the right lung base possibly due to scarring/atelectasis but developed since 2015 and increased since January 2017. There is no focal pneumonia, pulmonary edema, or pleural effusion. The visualized skeletal structures are unremarkable. IMPRESSION: No active cardiopulmonary disease. There is opacity of the right lung base possibly due to scarring/atelectasis but developed since 2015 and increased since January 2017. Consider further evaluation with chest CT on outpatient basis. Electronically Signed   By: Abelardo Diesel M.D.   On: 10/12/2016 18:54    Procedures Procedures (including critical care time)  Medications Ordered in ED Medications  furosemide (LASIX) injection 80 mg (not administered)     Initial Impression / Assessment and Plan / ED Course  I have reviewed the triage vital signs and the nursing notes.  Pertinent labs & imaging results that were available during my care of the patient were reviewed by me and considered in my medical decision making (see chart for details).     Patient presents with worsening peripheral edema and shortness of breath after discontinuing Lasix one week ago. He was hypoxic in his primary care office. He is 90% on 3 L of oxygen. 3 L is at baseline. Mild tachypnea. Pitting bilateral lower extremity edema. Lab work is notable for creatinine of 2.05. It has at times gone to 4.5.  Patient's baseline is around 2. Chest x-ray shows no pulmonary edema or effusions; however, he had does  have significant peripheral volume overload. Given his kidney function and need for diuresis, he likely needs observation admission for diuresis and monitoring of kidney function. Patient was given 80 mg of IV Lasix. I discussed this with the hospitalist.  He will admit.  Final Clinical Impressions(s) / ED Diagnoses   Final diagnoses:  Shortness of breath  Peripheral edema    New Prescriptions New Prescriptions   No medications on file   I personally performed the services described in this documentation, which was scribed in my presence. The recorded information has been reviewed and is accurate.    Merryl Hacker, MD 10/13/16 [phone]

## 2016-10-13 NOTE — ED Notes (Signed)
The pt was sent here from his doctors office with sob.  No pain

## 2016-10-13 NOTE — ED Notes (Signed)
Pt asking for food given  Lasix given

## 2016-10-13 NOTE — ED Notes (Signed)
One unsuccessful iv start iv team called

## 2016-10-13 NOTE — H&P (Signed)
History and Physical    Kenneth Fox ZOX:[id number] DOB: [birth year] DOA: 10/13/2016  PCP: Shanon Rosser, PA-C  Patient coming from: Home.  Chief Complaint: Shortness of breath.  HPI: Kenneth Fox is a 46 y.o. male with history of diastolic CHF last EF measured in December 2017 was 55-60%, diabetes mellitus type 2, sleep apnea not compliant with CPAP due to mask not fitting properly, chronic kidney disease stage III and morbid obesity presents to the ER because of worsening shortness of breath and lower extremity edema. Denies any chest pain or productive cough fever or chills. Denies any abdominal pain. Patient has been following with his nephrologist Dr. Clover Mealy. As per patient and patient's Lasix was discontinued last week because the patient's creatinine was worsening.    ED Course: Chest x-ray in the ER was unremarkable. On exam patient has significant bilateral lower extremity edema. Patient states he has increased weight by 16 pounds from last weeks. Patient was given Lasix 80 mg IV and admitted for further management of acute CHF.  Review of Systems: As per HPI, rest all negative.   Past Medical History:  Diagnosis Date  . Asthma   . CHF (congestive heart failure) ([place])   . Diabetes ([place])   . Gastroparesis   . GERD (gastroesophageal reflux disease)   . Hypertension   . Kidney disorder   . Neuropathy   . Obesity   . Obstructive sleep apnea    will not wear CPAP  . Renal insufficiency   . Sickle cell trait [facility])     Past Surgical History:  Procedure Laterality Date  . HIP FRACTURE SURGERY Right 1999   "put a plate in"     reports that he has never smoked. He has never used smokeless tobacco. He reports that he does not drink alcohol or use drugs.  Allergies  Allergen Reactions  . Angiotensin Receptor Blockers Other (See Comments)    Acute renal failure in patient w R heart failure  . Lisinopril Other (See Comments)    Patient developed AKI after being on  lisinopril for a week.    Family History  Problem Relation Age of Onset  . Sickle cell anemia Mother   . Heart attack Mother   . Diabetes Father   . Neuropathy Father   . Hypertension Father   . Aneurysm Father   . Kidney disease Brother   . Kidney disease Sister     Prior to Admission medications   Medication Sig Start Date End Date Taking? Authorizing Provider  gabapentin (NEURONTIN) 600 MG tablet Take 600 mg by mouth 3 (three) times daily.   Yes [provider]  hydrALAZINE (APRESOLINE) 50 MG tablet Take 1 tablet (50 mg total) by mouth 3 (three) times daily. 06/06/15  Yes Robbie Lis, MD  insulin aspart (NOVOLOG) 100 UNIT/ML injection Inject 10-20 Units into the skin 3 (three) times daily with meals. per sliding scale CBG 70 - 120: 0 units CBG 121 - 150: 2 units CBG 151 - 200: 3 units CBG 201 - 250: 5 units CBG 251 - 300: 8 units CBG 301 - 350: 11 units CBG 351 - 400: 15 units 06/06/15  Yes Robbie Lis, MD  Insulin Degludec (TRESIBA FLEXTOUCH) 200 UNIT/ML SOPN Inject 80 Units into the skin every evening.   Yes [provider]  insulin detemir (LEVEMIR) 100 UNIT/ML injection Inject 0.8 mLs (80 Units total) into the skin every evening. 12/13/15  Yes Rai, Vernelle Emerald, MD  ketoconazole (NIZORAL) 2 % cream Apply 1 application topically daily.   Yes [provider]  lidocaine (XYLOCAINE) 5 % ointment Apply 1 application topically 2 (two) times daily as needed for pain. 04/15/16  Yes [provider]  metFORMIN (GLUCOPHAGE) 500 MG tablet Take 1 tablet (500 mg total) by mouth 3 (three) times daily. Patient taking differently: Take 500 mg by mouth 2 (two) times daily with a meal.  12/13/15  Yes Rai, Ripudeep K, MD  metoprolol succinate (TOPROL-XL) 50 MG 24 hr tablet Take 50 mg by mouth daily. 10/18/15  Yes [provider]  Oxycodone HCl 10 MG TABS Take 1 tablet by mouth every 6 (six) hours as needed (pain).  04/17/14  Yes [provider]   pantoprazole (PROTONIX) 40 MG tablet Take 40 mg by mouth daily.   Yes [provider]  polyethylene glycol (MIRALAX / GLYCOLAX) packet Take 17 g by mouth daily.   Yes [provider]  potassium chloride SA (K-DUR,KLOR-CON) 20 MEQ tablet Take 20 mEq by mouth daily.   Yes [provider]  pregabalin (LYRICA) 150 MG capsule Take 150 mg by mouth 3 (three) times daily.   Yes [provider]  blood glucose meter kit and supplies Dispense based on patient and insurance preference. Use up to four times daily as directed. (FOR ICD-9 250.00, 250.01). 05/11/16   Patrecia Pour, MD  dicyclomine (BENTYL) 20 MG tablet Take 1 tablet (20 mg total) by mouth 2 (two) times daily. Patient not taking: Reported on 10/13/2016 05/29/15   Comer Locket, PA-C  metoCLOPramide (REGLAN) 10 MG tablet Take 1 tablet (10 mg total) by mouth every 8 (eight) hours as needed for nausea or vomiting. Patient not taking: Reported on 05/09/2016 12/06/15   Clayton Bibles, PA-C  promethazine (PHENERGAN) 12.5 MG tablet Take 1 tablet (12.5 mg total) by mouth every 6 (six) hours as needed for nausea or vomiting. Patient not taking: Reported on 10/13/2016 12/13/15   Mendel Corning, MD    Physical Exam: Vitals:   10/12/16 1753 10/12/16 1807 10/12/16 2023 10/12/16 2256  BP: (!) 195/114  (!) 166/78 (!) 156/71  Pulse: 89  89 81  Resp: (!) _0 Temp: 97.3 F (36.3 C)  97.8 F (36.6 C) 97.7 F (36.5 C)  TempSrc: Oral  Oral Oral  SpO2: 96%  98% 90%  Weight: (!) 163.3 kg (360 lb) (!) 152.4 kg (336 lb)    Height: _1  (1.803 m)         Constitutional: Obese not in distress. Vitals:   10/12/16 1753 10/12/16 1807 10/12/16 2023 10/12/16 2256  BP: (!) 195/114  (!) 166/78 (!) 156/71  Pulse: 89  89 81  Resp: (!) _2 Temp: 97.3 F (36.3 C)  97.8 F (36.6 C) 97.7 F (36.5 C)  TempSrc: Oral  Oral Oral  SpO2: 96%  98% 90%  Weight: (!) 163.3 kg (360 lb) (!) 152.4 kg (336 lb)    Height: _3   (1.803 m)      Eyes: Anicteric no pallor. ENMT: No discharge from the ears eyes nose and mouth. Neck: No JVD appreciated. No mass felt. Respiratory: No rhonchi or crepitations. Cardiovascular: S1-S2 heard no murmurs appreciated. Abdomen: Soft nontender bowel sounds present. Musculoskeletal: Bilateral lower extremity edema. Skin: No rash. Skin appears warm. Neurologic: Alert awake oriented to time place and person. Moves all extremities. Psychiatric: Appears normal. Normal affect.   Labs on Admission: I have personally  reviewed following labs and imaging studies  CBC:  Recent Labs Lab 10/12/16 1757  WBC 8.7  HGB 13.2  HCT 40.8  MCV 84.0  PLT 384   Basic Metabolic Panel:  Recent Labs Lab 10/12/16 1757  NA 139  K 3.8  CL 104  CO2 24  GLUCOSE 208*  BUN 17  CREATININE 2.05*  CALCIUM 8.7*   GFR: Estimated Creatinine Clearance: 66.9 mL/min (A) (by C-G formula based on SCr of 2.05 mg/dL (H)). Liver Function Tests: No results for input(s): AST, ALT, ALKPHOS, BILITOT, PROT, ALBUMIN in the last 168 hours. No results for input(s): LIPASE, AMYLASE in the last 168 hours. No results for input(s): AMMONIA in the last 168 hours. Coagulation Profile: No results for input(s): INR, PROTIME in the last 168 hours. Cardiac Enzymes: No results for input(s): CKTOTAL, CKMB, CKMBINDEX, TROPONINI in the last 168 hours. BNP (last 3 results) No results for input(s): PROBNP in the last 8760 hours. HbA1C: No results for input(s): HGBA1C in the last 72 hours. CBG:  Recent Labs Lab 10/12/16 2233 10/13/16 0041  GLUCAP 224* 205*   Lipid Profile: No results for input(s): CHOL, HDL, LDLCALC, TRIG, CHOLHDL, LDLDIRECT in the last 72 hours. Thyroid Function Tests: No results for input(s): TSH, T4TOTAL, FREET4, T3FREE, THYROIDAB in the last 72 hours. Anemia Panel: No results for input(s): VITAMINB12, FOLATE, FERRITIN, TIBC, IRON, RETICCTPCT in the last 72 hours. Urine analysis:      Component Value Date/Time   COLORURINE STRAW (A) 05/09/2016 2252   APPEARANCEUR CLEAR 05/09/2016 2252   LABSPEC 1.013 05/09/2016 2252   PHURINE 5.0 05/09/2016 2252   GLUCOSEU 250 (A) 05/09/2016 2252   HGBUR TRACE (A) 05/09/2016 2252   BILIRUBINUR NEGATIVE 05/09/2016 2252   KETONESUR NEGATIVE 05/09/2016 2252   PROTEINUR >300 (A) 05/09/2016 2252   UROBILINOGEN 1.0 01/17/2015 2230   NITRITE NEGATIVE 05/09/2016 2252   LEUKOCYTESUR NEGATIVE 05/09/2016 2252   Sepsis Labs: _0 (procalcitonin:4,lacticidven:4) )No results found for this or any previous visit (from the past 240 hour(s)).   Radiological Exams on Admission: Dg Chest 2 View  Result Date: 10/12/2016 CLINICAL DATA:  Shortness of breath for 8 months EXAM: CHEST  2 VIEW COMPARISON:  February 20, 2016, June 27, 2015, March 08, 2014 FINDINGS: The heart size and mediastinal contours are stable. The heart size is enlarged. There is opacity of the right lung base possibly due to scarring/atelectasis but developed since 2015 and increased since January 2017. There is no focal pneumonia, pulmonary edema, or pleural effusion. The visualized skeletal structures are unremarkable. IMPRESSION: No active cardiopulmonary disease. There is opacity of the right lung base possibly due to scarring/atelectasis but developed since 2015 and increased since January 2017. Consider further evaluation with chest CT on outpatient basis. Electronically Signed   By: Abelardo Diesel M.D.   On: 10/12/2016 18:54     Assessment/Plan Principal Problem:   Acute on chronic diastolic CHF (congestive heart failure) (HCC) Active Problems:   Essential hypertension   Obstructive sleep apnea   Uncontrolled type 2 diabetes mellitus with diabetic neuropathy, with long-term current use of insulin (HCC)   Morbid obesity due to excess calories ([facility])   Fluid overload   CHF (congestive heart failure) ([place])    1. Acute on chronic diastolic CHF last EF measured in  December 2017 was 55-60% with progressive renal failure - patient is placed on Lasix 80 mg IV every 12. Closely follow intake and output and daily weights and metabolic panel. 2. Hypertension uncontrolled probably contributing  to #1 symptom - patient is on hydralazine and metoprolol. Will keep patient on when necessary IV hydralazine in addition. Closely follow blood pressure trends. 3. Chronic kidney disease stage III - being followed by nephrologist. Closely follow metabolic panel since patient is on increased dose of Lasix. 4. Diabetes mellitus type 2 - patient is on Lantus insulin 80 units at bedtime. I have decreased it to 60 units with sliding scale coverage. Hold metformin while inpatient. 5. Peripheral neuropathy on Lyrica. 6. History of OSA noncompliant with CPAP probably contributing to #1.  EKG is pending.   DVT prophylaxis: Heparin. Code Status: Full code.  Family Communication: Discussed with patient.  Disposition Plan: Home.  Consults called: None.  Admission status: Inpatient.    Rise Patience MD [facility] Pager [phone].  If 7PM-7AM, please contact night-coverage [email or web address] Password [facility]  10/13/2016, 2:34 AM

## 2016-10-13 NOTE — Care Management Note (Addendum)
Case Management Note  Patient Details  Name: Kenneth Fox MRN: [id number] Date of Birth: 06/12/1970  Subjective/Objective:   Admitted with CHF                Action/Plan: Patient lives at home with spouse; PCP [name], [name], PA-C; has private insurane with Medicare / Medicaid with prescription drug coverage; CM will continue to follow for DCP  Expected Discharge Date:  10/16/16               Expected Discharge Plan:  Home w Home Health Services  Discharge planning Services  CM Consult   Status of Service:  In process, will continue to follow  Reola MosherChandler, Tattiana Fakhouri L, RN,MHA,BSN [phone] 10/13/2016, 2:32 PM

## 2016-10-13 NOTE — Progress Notes (Signed)
Pt educated about safety and importance of bed alarm during the night however pt refuses to be on bed alarm. Will continue to round on patient.   Bhavika Schnider, RN    

## 2016-10-13 NOTE — Progress Notes (Signed)
Pt is alert and oriented tired most of the day. 02 n/c PRN. Uses urinal, 1100 off today. 1200 Fl/Rt pushes limits. Plan To Diurese.

## 2016-10-13 NOTE — Progress Notes (Signed)
Inpatient Diabetes Program Recommendations  AACE/ADA: New Consensus Statement on Inpatient Glycemic Control (2015)  Target Ranges:  Prepandial:   less than 140 mg/dL      Peak postprandial:   less than 180 mg/dL (1-2 hours)      Critically ill patients:  140 - 180 mg/dL   Lab Results  Component Value Date   GLUCAP 258 (H) 10/13/2016   HGBA1C 9.4 (H) 12/12/2015    Review of Glycemic Control Results for Kenneth Fox, Kenneth Fox (MRN [id number]) as of 10/13/2016 10:04  Ref. Range 10/12/2016 22:33 10/13/2016 00:41 10/13/2016 03:20 10/13/2016 07:35  Glucose-Capillary Latest Ref Range: 65 - 99 mg/dL 478224 (H) 295205 (H) 621214 (H) 258 (H)   Diabetes history: DM2 Outpatient Diabetes medications:Tresiba 80 units q hs + Novolog 10-20 units tid + Metformin 500 mg bid Current orders for Inpatient glycemic control: Lantus 60 units qd + Novolog correction 0-9 units tid  Inpatient Diabetes Program Recommendations:    Please consider: -Increase Lantus to 80 units daily -Novolog 5 units meal coverage tid if eats 50% Will follow.  Thank you, Kenneth FischerJudy Fox. Traivon Morrical, RN, MSN, CDE  Diabetes Coordinator Inpatient Glycemic Control Team Team Pager [phone] (8am-5pm) 10/13/2016 10:16 AM

## 2016-10-13 NOTE — Progress Notes (Signed)
PROGRESS NOTE                                                                                                                                                                                                             Patient Demographics:    Kenneth Fox, is a 46 y.o. male, DOB - 01/25/1971, ZOX:[id number]  Admit date - 10/13/2016   Admitting Physician [name], MD  Outpatient Primary MD for the patient is [name], [name], PA-C  LOS - 0  Chief Complaint  Patient presents with  . Shortness of Breath       Brief Narrative  Kenneth Rathkehomas E Fox is a 46 y.o. male with history of diastolic CHF last EF measured in December 2017 was 55-60%, diabetes mellitus type 2, sleep apnea not compliant with CPAP due to mask not fitting properly, chronic kidney disease stage III and morbid obesity presents to the ER because of worsening shortness of breath and lower extremity edema, Apparently his Lasix was stopped recently due to worsening renal function and since then he has gained about 16 pounds of weight, was admitted for acute on chronic diastolic CHF.   Subjective:    Kenneth Fox today has, No headache, No chest pain, No abdominal pain - No Nausea, No new weakness tingling or numbness, No Cough - Mild SOB.     Assessment  & Plan :     1. Acute on chronic diastolic CHF. On IV Lasix 80 mg twice a day, given a dose of Zaroxolyn today, continue salt and fluid restriction, added TED stockings, daily weight, monitor intake and output and BMP. He is so far about 1 L negative and current weight is 354 pounds.  2. Morbid obesity with obstructive sleep apnea. Counseled on CPAP compliance, continue CPAP daily at bedtime, follow with PCP for weight loss.  3. CKD stage IV. Baseline creatinine close to 2, currently at baseline monitor with diuresis. Follows with [name].  4. Essential hypertension - currently on combination of  diuretic, beta blocker, will increase hydralazine dose for better control. Monitor blood pressure.  5. GERD. Continue PPI.   6. DM type II. On Lantus and sliding scale. Monitor and adjust.  Lab Results  Component Value Date   HGBA1C 9.4 (H) 12/12/2015   CBG (last 3)   Recent Labs  10/13/16 0041 10/13/16  0320 10/13/16 0735  GLUCAP 205* 214* 258*      Diet : Diet heart healthy/carb modified Room service appropriate? Yes; Fluid consistency: Thin; Fluid restriction: 1200 mL Fluid    Family Communication  :  None  Code Status :  Full  Disposition Plan  :  TBD  Consults  :  None  Procedures  :    DVT Prophylaxis  :  Heparin    Lab Results  Component Value Date   PLT 247 10/13/2016    Inpatient Medications  Scheduled Meds: . furosemide  80 mg Intravenous Q12H  . heparin  5,000 Units Subcutaneous Q8H  . hydrALAZINE  50 mg Oral TID  . insulin aspart  0-9 Units Subcutaneous TID WC  . insulin glargine  60 Units Subcutaneous QHS  . metolazone  2.5 mg Oral Once  . metoprolol succinate  50 mg Oral Daily  . pantoprazole  40 mg Oral Daily  . polyethylene glycol  17 g Oral Daily  . potassium chloride SA  20 mEq Oral BID  . pregabalin  150 mg Oral TID   Continuous Infusions: PRN Meds:.acetaminophen **OR** [DISCONTINUED] acetaminophen, [DISCONTINUED] ondansetron **OR** ondansetron (ZOFRAN) IV  Antibiotics  :    Anti-infectives    None         Objective:   Vitals:   10/13/16 0115 10/13/16 0231 10/13/16 0244 10/13/16 0327  BP: (!) 152/98  127/77 (!) 162/87  Pulse: 74  74 77  Resp: 19   20  Temp:    97.7 F (36.5 C)  TempSrc:    Oral  SpO2: 97% 90%  98%  Weight:    (!) 160.9 kg (354 lb 12.8 oz)  Height:    5\' 11"  (1.803 m)    Wt Readings from Last 3 Encounters:  10/13/16 (!) 160.9 kg (354 lb 12.8 oz)  05/09/16 (!) 144.9 kg (319 lb 8 oz)  05/04/16 (!) 149.7 kg (330 lb)     Intake/Output Summary (Last 24 hours) at 10/13/16 0939 Last data filed at  10/13/16 0600  Gross per 24 hour  Intake              120 ml  Output              800 ml  Net             -680 ml     Physical Exam  Awake Alert, Oriented X 3, No new F.N deficits, Normal affect [place].AT,PERRAL Supple Neck,No JVD, No cervical lymphadenopathy appriciated.  Symmetrical Chest wall movement, Good air movement bilaterally, few rales RRR,No Gallops,Rubs or new Murmurs, No Parasternal Heave +ve B.Sounds, Abd Soft, No tenderness, No organomegaly appriciated, No rebound - guarding or rigidity. No Cyanosis, Clubbing , 2+ edema, No new Rash or bruise       Data Review:    CBC  Recent Labs Lab 10/12/16 1757 10/13/16 0236  WBC 8.7 8.0  HGB 13.2 12.6*  HCT 40.8 40.0  PLT 247 247  MCV 84.0 84.2  MCH 27.2 26.5  MCHC 32.4 31.5  RDW 15.4 15.5    Chemistries   Recent Labs Lab 10/12/16 1757 10/13/16 0236  NA 139 141  K 3.8 3.5  CL 104 105  CO2 24 29  GLUCOSE 208* 219*  BUN 17 13  CREATININE 2.05* 1.93*  CALCIUM 8.7* 8.4*   ------------------------------------------------------------------------------------------------------------------ No results for input(s): CHOL, HDL, LDLCALC, TRIG, CHOLHDL, LDLDIRECT in the last 72 hours.  Lab Results  Component Value Date  HGBA1C 9.4 (H) 12/12/2015   ------------------------------------------------------------------------------------------------------------------  Recent Labs  10/13/16 0236  TSH 2.329   ------------------------------------------------------------------------------------------------------------------ No results for input(s): VITAMINB12, FOLATE, FERRITIN, TIBC, IRON, RETICCTPCT in the last 72 hours.  Coagulation profile No results for input(s): INR, PROTIME in the last 168 hours.  No results for input(s): DDIMER in the last 72 hours.  Cardiac Enzymes No results for input(s): CKMB, TROPONINI, MYOGLOBIN in the last 168 hours.  Invalid input(s):  CK ------------------------------------------------------------------------------------------------------------------    Component Value Date/Time   BNP 55.9 10/12/2016 1805    Micro Results No results found for this or any previous visit (from the past 240 hour(s)).  Radiology Reports Dg Chest 2 View  Result Date: 10/12/2016 CLINICAL DATA:  Shortness of breath for 8 months EXAM: CHEST  2 VIEW COMPARISON:  February 20, 2016, June 27, 2015, March 08, 2014 FINDINGS: The heart size and mediastinal contours are stable. The heart size is enlarged. There is opacity of the right lung base possibly due to scarring/atelectasis but developed since 2015 and increased since January 2017. There is no focal pneumonia, pulmonary edema, or pleural effusion. The visualized skeletal structures are unremarkable. IMPRESSION: No active cardiopulmonary disease. There is opacity of the right lung base possibly due to scarring/atelectasis but developed since 2015 and increased since January 2017. Consider further evaluation with chest CT on outpatient basis. Electronically Signed   By: Sherian Rein M.D.   On: 10/12/2016 18:54    Time Spent in minutes  30   Susa Raring M.D on 10/13/2016 at 9:39 AM  Between 7am to 7pm - Pager - [phone] ( page via [email or web address], text pages only, please mention full 10 digit call back number). After 7pm go to [email or web address] - password [facility]

## 2016-10-13 NOTE — Progress Notes (Signed)
Patients CBG 159 tonight, 60 units of Lantus. Paged NP on call and asked should this be given. Instructed to give. Will continue to monitor.  Teal Bontrager, RN

## 2016-10-13 NOTE — Progress Notes (Signed)
Pt places self on/off cpap.  Rt will monitor. 

## 2016-10-14 DIAGNOSIS — I5033 Acute on chronic diastolic (congestive) heart failure: Secondary | ICD-10-CM

## 2016-10-14 LAB — BASIC METABOLIC PANEL
ANION GAP: 9 (ref 5–15)
BUN: 16 mg/dL (ref 6–20)
CALCIUM: 8.8 mg/dL — AB (ref 8.9–10.3)
CO2: 32 mmol/L (ref 22–32)
Chloride: 101 mmol/L (ref 101–111)
Creatinine, Ser: 2.36 mg/dL — ABNORMAL HIGH (ref 0.61–1.24)
GFR, EST AFRICAN AMERICAN: 36 mL/min — AB (ref >60)
GFR, EST NON AFRICAN AMERICAN: 31 mL/min — AB (ref >60)
GLUCOSE: 157 mg/dL — AB (ref 65–99)
Potassium: 3.4 mmol/L — ABNORMAL LOW (ref 3.5–5.1)
Sodium: 142 mmol/L (ref 135–145)

## 2016-10-14 LAB — GLUCOSE, CAPILLARY
GLUCOSE-CAPILLARY: 169 mg/dL — AB (ref 65–99)
GLUCOSE-CAPILLARY: 95 mg/dL (ref 65–99)
GLUCOSE-CAPILLARY: 166 mg/dL — AB (ref 65–99)
Glucose-Capillary: 155 mg/dL — ABNORMAL HIGH (ref 65–99)

## 2016-10-14 LAB — HEMOGLOBIN A1C
HEMOGLOBIN A1C: 11.2 % — AB (ref 4.8–5.6)
MEAN PLASMA GLUCOSE: 275 mg/dL

## 2016-10-14 MED ORDER — LIVING WELL WITH DIABETES BOOK
Freq: Once | Status: AC
Start: 2016-10-14 — End: 2016-10-14
  Administered 2016-10-14: 1
  Filled 2016-10-14: qty 1

## 2016-10-14 MED ORDER — ORAL CARE MOUTH RINSE
15.0000 mL | Freq: Two times a day (BID) | OROMUCOSAL | Status: DC
  Administered 2016-10-14 – 2016-10-15 (×3): 15 mL via OROMUCOSAL

## 2016-10-14 NOTE — Consult Note (Signed)
Kenneth Fox is an 46 y.o. male referred by Dr Kenneth Fox   Chief Complaint: Acute on CKD 3 HPI: 46yo BM with CKD 3 sec DM/HTN admitted after presenting to ER CO SOB and edema.  Baseline Scr 1.8-19 with last outpt Scr in March '18.  On admission Scr was 1.9 and today 2.3.  UO yest was 1.7 liters.  He says he was told to stop his lasix when seen by Dr Kenneth Fox in March but her note clearly states to take 73m BID.  He then admits that he may have been confused.  Overall he is confused about a lot of his health care issues.  He is not on low Na or restricted fluid diet.  His BS control is quite poor with HgA1c 11.2.  I had a long discussion with him about the need for him to get control of his medical issues or he wil be on HD sooner rather than later  Past Medical History:  Diagnosis Date  . Asthma   . CHF (congestive heart failure) ([place]   . Diabetes ([place]   . Gastroparesis   . GERD (gastroesophageal reflux disease)   . Hypertension   . Kidney disorder   . Neuropathy   . Obesity   . Obstructive sleep apnea    will not wear CPAP  . Renal insufficiency   . Sickle cell trait ([facility]     Past Surgical History:  Procedure Laterality Date  . HIP FRACTURE SURGERY Right 1999   "put a plate in"    Family History  Problem Relation Age of Onset  . Sickle cell anemia Mother   . Heart attack Mother   . Diabetes Father   . Neuropathy Father   . Hypertension Father   . Aneurysm Father   . Kidney disease Brother   . Kidney disease Sister   father, brother and sister all with ESRD ? Etiology  Social History:  reports that he has never smoked. He has never used smokeless tobacco. He reports that he does not drink alcohol or use drugs.Married but lives by himself  Allergies:  Allergies  Allergen Reactions  . Angiotensin Receptor Blockers Other (See Comments)    Acute renal failure in patient w R heart failure  . Lisinopril Other (See Comments)    Patient developed AKI after being on  lisinopril for a week.    Medications Prior to Admission  Medication Sig Dispense Refill  . gabapentin (NEURONTIN) 600 MG tablet Take 600 mg by mouth 3 (three) times daily.    . hydrALAZINE (APRESOLINE) 50 MG tablet Take 1 tablet (50 mg total) by mouth 3 (three) times daily. 90 tablet 0  . insulin aspart (NOVOLOG) 100 UNIT/ML injection Inject 10-20 Units into the skin 3 (three) times daily with meals. per sliding scale CBG 70 - 120: 0 units CBG 121 - 150: 2 units CBG 151 - 200: 3 units CBG 201 - 250: 5 units CBG 251 - 300: 8 units CBG 301 - 350: 11 units CBG 351 - 400: 15 units 1 vial 1  . Insulin Degludec (TRESIBA FLEXTOUCH) 200 UNIT/ML SOPN Inject 80 Units into the skin every evening.    . insulin detemir (LEVEMIR) 100 UNIT/ML injection Inject 0.8 mLs (80 Units total) into the skin every evening. 10 mL 2  . ketoconazole (NIZORAL) 2 % cream Apply 1 application topically daily.    .Marland Kitchenlidocaine (XYLOCAINE) 5 % ointment Apply 1 application topically 2 (two) times daily as needed for pain.    .[name]  metFORMIN (GLUCOPHAGE) 500 MG tablet Take 1 tablet (500 mg total) by mouth 3 (three) times daily. (Patient taking differently: Take 500 mg by mouth 2 (two) times daily with a meal. )    . metoprolol succinate (TOPROL-XL) 50 MG 24 hr tablet Take 50 mg by mouth daily.  0  . Oxycodone HCl 10 MG TABS Take 1 tablet by mouth every 6 (six) hours as needed (pain).   0  . pantoprazole (PROTONIX) 40 MG tablet Take 40 mg by mouth daily.    . polyethylene glycol (MIRALAX / GLYCOLAX) packet Take 17 g by mouth daily.    . potassium chloride SA (K-DUR,KLOR-CON) 20 MEQ tablet Take 20 mEq by mouth daily.    . pregabalin (LYRICA) 150 MG capsule Take 150 mg by mouth 3 (three) times daily.    . blood glucose meter kit and supplies Dispense based on patient and insurance preference. Use up to four times daily as directed. (FOR ICD-9 250.00, 250.01). 1 each 0  . dicyclomine (BENTYL) 20 MG tablet Take 1 tablet (20 mg total)  by mouth 2 (two) times daily. (Patient not taking: Reported on 10/13/2016) 20 tablet 0  . metoCLOPramide (REGLAN) 10 MG tablet Take 1 tablet (10 mg total) by mouth every 8 (eight) hours as needed for nausea or vomiting. (Patient not taking: Reported on 05/09/2016) 20 tablet 0  . promethazine (PHENERGAN) 12.5 MG tablet Take 1 tablet (12.5 mg total) by mouth every 6 (six) hours as needed for nausea or vomiting. (Patient not taking: Reported on 10/13/2016) 30 tablet 0     Lab Results: UA: ND  Recent Labs  10/12/16 1757 10/13/16 0236  WBC 8.7 8.0  HGB 13.2 12.6*  HCT 40.8 40.0  PLT 247 247   BMET  Recent Labs  10/12/16 1757 10/13/16 0236 10/14/16 0249  NA 139 141 142  K 3.8 3.5 3.4*  CL 104 105 101  CO2 24 29 32  GLUCOSE 208* 219* 157*  BUN 17 13 16   CREATININE 2.05* 1.93* 2.36*  CALCIUM 8.7* 8.4* 8.8*   LFT No results for input(s): PROT, ALBUMIN, AST, ALT, ALKPHOS, BILITOT, BILIDIR, IBILI in the last 72 hours. Dg Chest 2 View  Result Date: 10/12/2016 CLINICAL DATA:  Shortness of breath for 8 months EXAM: CHEST  2 VIEW COMPARISON:  February 20, 2016, June 27, 2015, March 08, 2014 FINDINGS: The heart size and mediastinal contours are stable. The heart size is enlarged. There is opacity of the right lung base possibly due to scarring/atelectasis but developed since 2015 and increased since January 2017. There is no focal pneumonia, pulmonary edema, or pleural effusion. The visualized skeletal structures are unremarkable. IMPRESSION: No active cardiopulmonary disease. There is opacity of the right lung base possibly due to scarring/atelectasis but developed since 2015 and increased since January 2017. Consider further evaluation with chest CT on outpatient basis. Electronically Signed   By: Kenneth Fox M.D.   On: 10/12/2016 18:54    ROS: Appetite good + DOE No CP No change in bowels No dysuria Mild numbness of feet  PHYSICAL EXAM: Blood pressure 95/79, pulse 65, temperature  97.7 F (36.5 C), temperature source Oral, resp. rate 18, height 5' 11"  (1.803 m), weight (!) 155.8 kg (343 lb 6.4 oz), SpO2 94 %. HEENT: PERRLA EOMI NECK:No JVD LUNGS:Clear CARDIAC:RRR wo MRG ABD:+ BS NTND Obese  No overt HSM EXT:1-2+ edema NEURO:CNI Ox3 no asterixis  Assessment: 1. CKD 3 sec DM/HTN  Baseline Scr 1.9.  Now 2.3 2.  HTN 3. DM 4. OSA PLAN: 1. Lasix 30m bid.  I discussed adjusting dose based on his weight and the need to get a scale and BP monitor for home 2. Switch lasix to PO in AM 3. Recheck renal fx in am   Pleasant Bensinger T 10/14/2016, 12:44 PM

## 2016-10-14 NOTE — Plan of Care (Signed)
Problem: Food- and Nutrition-Related Knowledge Deficit (NB-1.1) Goal: Nutrition education Formal process to instruct or train a patient/client in a skill or to impart knowledge to help patients/clients voluntarily manage or modify food choices and eating behavior to maintain or improve health. Outcome: Completed/Met Date Met: 10/14/16 Nutrition Education Note  RD consulted for nutrition education regarding new onset CHF.  RD provided "Heart Failure Nutrition Therapy" handout from the Academy of [employer]. Reviewed patient's dietary recall. Provided examples on ways to decrease sodium intake in diet. Discouraged intake of processed foods and use of salt shaker. Encouraged fresh fruits and vegetables as well as whole grain sources of carbohydrates to maximize fiber intake.   RD discussed why it is important for patient to adhere to diet recommendations, and emphasized the role of fluids, foods to avoid, and importance of weighing self daily. Teach back method used.  Pt expressed that he is seperated from his wife and lives alone but wife does help out sometimes with grocery shopping and could possibley assist with cooking. Pt reports he cannot stand for long periods due to back pain and SOB which makes preparing meals difficult. Discussed strategies for managing this including sitting on a stool/counter-height chair while cooking, preparing quick meals/quick cook foods such as cereal or oatmeal for breakfast, frozen steam-in bag veggies, etc.   Expect good compliance.  Body mass index is 47.89 kg/m. Pt meets criteria for morbid obesity based on current BMI.  Current diet order is heart healthy/carb modified , patient is consuming approximately 100% of meals at this time. Labs and medications reviewed. No further nutrition interventions warranted at this time. RD contact information provided. If additional nutrition issues arise, please re-consult RD.   Kerman Passey MS, RD, LDN [phone] Pager  [phone] Weekend/On-Call Pager

## 2016-10-14 NOTE — Evaluation (Signed)
Physical Therapy Evaluation Patient Details Name: Kenneth Rathkehomas E Tortorella MRN: [id number] DOB: 1970/10/01 Today's Date: 10/14/2016   History of Present Illness  Kenneth Fox is a 46 y.o. male with history of diastolic CHF last EF measured in December 2017 was 55-60%, diabetes mellitus type 2, sleep apnea not compliant with CPAP due to mask not fitting properly, chronic kidney disease stage III and morbid obesity presents to the ER because of worsening shortness of breath and lower extremity edema.  Clinical Impression  Patient presents with mobility limited by pain and hypoxia on room air.  He mobilizes unaided, but is limited due to neuropathy in feet and noted drop in SpO2 to 83% ambulating on room air.  Feel continued skilled PT in the acute setting to ensure improved activity tolerance and balance.  No follow up PT needs at this time.  Patient would like to obtain portable oxygen if possible (over the shoulder).      Follow Up Recommendations Home health PT    Equipment Recommendations  None recommended by PT    Recommendations for Other Services       Precautions / Restrictions Precautions Precaution Comments: watch O2      Mobility  Bed Mobility               General bed mobility comments: up in chair  Transfers Overall transfer level: Modified independent               General transfer comment: pushed up from recliner independently  Ambulation/Gait Ambulation/Gait assistance: Supervision Ambulation Distance (Feet): 15 Feet Assistive device: None Gait Pattern/deviations: Step-through pattern;Decreased stride length;Wide base of support     General Gait Details: declined to walk further due to neuropathy pain and from LE edema.  Some imbalance walking back around bed to chair, but independent recovery.  Patient reports going to bathroom on his own in the room.  SpO2 83% after amb on RA, 88% at rest on RA, O2 applied at [employer]2LPM  Stairs            Wheelchair  Mobility    Modified Rankin (Stroke Patients Only)       Balance Overall balance assessment: Needs assistance   Sitting balance-Leahy Scale: Good       Standing balance-Leahy Scale: Fair                               Pertinent Vitals/Pain Pain Assessment: 0-10 Pain Score: 10-Worst pain ever Pain Location: feet with ambulation Pain Descriptors / Indicators: Other (Comment) (like has knots in them) Pain Intervention(s): Monitored during session;Limited activity within patient's tolerance;Repositioned    Home Living Family/patient expects to be discharged to:: Private residence Living Arrangements: Alone Available Help at Discharge: Available PRN/intermittently;Family (wife (separated)) Type of Home: Apartment Home Access: Level entry     Home Layout: One level Home Equipment: Walker - 2 wheels;Cane - single point;Tub bench Additional Comments: wife comes to assist intermittently    Prior Function Level of Independence: Independent;Independent with assistive device(s)         Comments: uses cane     Hand Dominance        Extremity/Trunk Assessment   Upper Extremity Assessment Upper Extremity Assessment: Overall WFL for tasks assessed    Lower Extremity Assessment Lower Extremity Assessment: Overall WFL for tasks assessed       Communication   Communication: No difficulties  Cognition Arousal/Alertness: Awake/alert Behavior During Therapy: [facility] LLC[facility]FL  for tasks assessed/performed Overall Cognitive Status: Within Functional Limits for tasks assessed                                        General Comments General comments (skin integrity, edema, etc.): encouraged to continue in room ambulation with caution    Exercises     Assessment/Plan    PT Assessment Patient needs continued PT services  PT Problem List Decreased mobility;Decreased balance;Decreased activity tolerance;Pain       PT Treatment Interventions DME  instruction;Gait training;Functional mobility training;Balance training;Therapeutic exercise;Patient/family education    PT Goals (Current goals can be found in the Care Plan section)  Acute Rehab PT Goals Patient Stated Goal: To return home, get portable oxygen PT Goal Formulation: With patient Time For Goal Achievement: 10/17/16 Potential to Achieve Goals: Good    Frequency Min 3X/week   Barriers to discharge        Co-evaluation               AM-PAC PT "6 Clicks" Daily Activity  Outcome Measure Difficulty turning over in bed (including adjusting bedclothes, sheets and blankets)?: A Little Difficulty moving from lying on back to sitting on the side of the bed? : A Little Difficulty sitting down on and standing up from a chair with arms (e.g., wheelchair, bedside commode, etc,.)?: A Little Help needed moving to and from a bed to chair (including a wheelchair)?: A Little Help needed walking in hospital room?: A Little Help needed climbing 3-5 steps with a railing? : A Little 6 Click Score: 18    End of Session Equipment Utilized During Treatment: Oxygen Activity Tolerance: Patient limited by pain Patient left: in chair;with call bell/phone within reach   PT Visit Diagnosis: Unsteadiness on feet (R26.81);Other abnormalities of gait and mobility (R26.89)    Time: 0955-1006 PT Time Calculation (min) (ACUTE ONLY): 11 min   Charges:   PT Evaluation $PT Eval Moderate Complexity: 1 Procedure     PT G CodesSheran Lawless, PT [phone] 10/14/2016   Elray Mcgregor 10/14/2016, 10:38 AM

## 2016-10-14 NOTE — Progress Notes (Signed)
Patient ID: Kenneth Fox, male   DOB: 03-19-1971, 46 y.o.   MRN: [id number]  PROGRESS NOTE    Kenneth Fox  NFA:[id number] DOB: 1970-06-14 DOA: 10/13/2016 PCP: Lindaann Pascal, PA-C   Brief Narrative:  46 y.o.malewith history of diastolic CHF last EF measured in December 2017 was 55-60%, diabetes mellitus type 2, sleep apnea not compliant with CPAP due to mask not fitting properly, chronic kidney disease stage III and morbid obesity presents to the ER because of worsening shortness of breath and lower extremity edema, Apparently his Lasix was stopped recently due to worsening renal function and since then he has gained about 16 pounds of weight, was admitted for acute on chronic diastolic CHF. His symptoms are improving gradually.  Assessment & Plan:   Principal Problem:   Acute on chronic diastolic CHF (congestive heart failure) (HCC) Active Problems:   Essential hypertension   Obstructive sleep apnea   Uncontrolled type 2 diabetes mellitus with diabetic neuropathy, with long-term current use of insulin (HCC)   Morbid obesity due to excess calories (HCC)   Fluid overload   CHF (congestive heart failure) (HCC)   1. Acute on chronic diastolic CHF. On IV Lasix 80 mg twice a day, continue salt and fluid restriction, daily weight, monitor intake and output and BMP.  -Continue metoprolol and hydralazine  2. Morbid obesity with obstructive sleep apnea. Counseled on CPAP compliance, continue CPAP daily at bedtime, follow with PCP for weight loss.  3. CKD stage IV. Baseline creatinine close to 2,  -Creatinine slightly worsened today. Spoke to Dr. Briant Cedar, he'll see the patient in consult. Repeat creatinine for tomorrow  4. Essential hypertension - currently on combination of diuretic, beta blocker, and hydralazine. Monitor blood pressure  5. GERD. Continue PPI.   6. DM type II uncontrolled with hyperglycemia: On Lantus and sliding scale. Monitor blood sugars  7. Hypokalemia:  Replace, repeat labs in a.m.   DVT prophylaxis: Heparin Code Status: Full Family Communication: None present at  Disposition Plan: Home in 1-2 days; might need home care Consultants:  Nephrology  Procedures: None   Antimicrobials: None Subjective: Patient seen and examined at bedside. He feels slightly better but still complains of shortness of breath with exertion and lower extremity swelling. No overnight fever, nausea, vomiting.  Objective: Vitals:   10/13/16 2202 10/13/16 2213 10/14/16 0100 10/14/16 0629  BP: (!) 148/73  (!) 153/88 (!) 145/82  Pulse: 64 78 69 76  Resp: 20 20 20 20   Temp: 98.1 F (36.7 C)  98.2 F (36.8 C) 98 F (36.7 C)  TempSrc: Oral  Oral Oral  SpO2: 98% 97% 96% 97%  Weight:    (!) 155.8 kg (343 lb 6.4 oz)  Height:        Intake/Output Summary (Last 24 hours) at 10/14/16 1106 Last data filed at 10/14/16 0902  Gross per 24 hour  Intake              735 ml  Output             1750 ml  Net            -1015 ml   Filed Weights   10/12/16 1807 10/13/16 0327 10/14/16 0629  Weight: (!) 152.4 kg (336 lb) (!) 160.9 kg (354 lb 12.8 oz) (!) 155.8 kg (343 lb 6.4 oz)    Examination:  General exam: Appears calm and comfortable  Respiratory system: Bilateral decreased breath sounds bases with some scattered crackles. Cardiovascular system: S1 &  S2 heard, RRR. Gastrointestinal system: Morbidly obese. Abdomen is nondistended, soft and nontender. No organomegaly or masses felt. Normal bowel sounds heard. Central nervous system: Alert and oriented. No focal neurological deficits. Extremities: No cyanosis, clubbing. Bilateral lower extremity 1-2+ pitting edema present.   Data Reviewed: I have personally reviewed following labs and imaging studies  CBC:  Recent Labs Lab 10/12/16 1757 10/13/16 0236  WBC 8.7 8.0  HGB 13.2 12.6*  HCT 40.8 40.0  MCV 84.0 84.2  PLT 247 247   Basic Metabolic Panel:  Recent Labs Lab 10/12/16 1757 10/13/16 0236  10/14/16 0249  NA 139 141 142  K 3.8 3.5 3.4*  CL 104 105 101  CO2 24 29 32  GLUCOSE 208* 219* 157*  BUN 17 13 16   CREATININE 2.05* 1.93* 2.36*  CALCIUM 8.7* 8.4* 8.8*   GFR: Estimated Creatinine Clearance: 58.8 mL/min (A) (by C-G formula based on SCr of 2.36 mg/dL (H)). Liver Function Tests: No results for input(s): AST, ALT, ALKPHOS, BILITOT, PROT, ALBUMIN in the last 168 hours. No results for input(s): LIPASE, AMYLASE in the last 168 hours. No results for input(s): AMMONIA in the last 168 hours. Coagulation Profile: No results for input(s): INR, PROTIME in the last 168 hours. Cardiac Enzymes:  Recent Labs Lab 10/13/16 0808  TROPONINI <0.03   BNP (last 3 results) No results for input(s): PROBNP in the last 8760 hours. HbA1C:  Recent Labs  10/13/16 0236  HGBA1C 11.2*   CBG:  Recent Labs Lab 10/13/16 0735 10/13/16 1115 10/13/16 1608 10/13/16 2135 10/14/16 0742  GLUCAP 258* 287* 256* 159* 155*   Lipid Profile: No results for input(s): CHOL, HDL, LDLCALC, TRIG, CHOLHDL, LDLDIRECT in the last 72 hours. Thyroid Function Tests:  Recent Labs  10/13/16 0236  TSH 2.329   Anemia Panel: No results for input(s): VITAMINB12, FOLATE, FERRITIN, TIBC, IRON, RETICCTPCT in the last 72 hours. Sepsis Labs: No results for input(s): PROCALCITON, LATICACIDVEN in the last 168 hours.  No results found for this or any previous visit (from the past 240 hour(s)).       Radiology Studies: Dg Chest 2 View  Result Date: 10/12/2016 CLINICAL DATA:  Shortness of breath for 8 months EXAM: CHEST  2 VIEW COMPARISON:  February 20, 2016, June 27, 2015, March 08, 2014 FINDINGS: The heart size and mediastinal contours are stable. The heart size is enlarged. There is opacity of the right lung base possibly due to scarring/atelectasis but developed since 2015 and increased since January 2017. There is no focal pneumonia, pulmonary edema, or pleural effusion. The visualized skeletal  structures are unremarkable. IMPRESSION: No active cardiopulmonary disease. There is opacity of the right lung base possibly due to scarring/atelectasis but developed since 2015 and increased since January 2017. Consider further evaluation with chest CT on outpatient basis. Electronically Signed   By: Sherian ReinWei-Chen  Lin M.D.   On: 10/12/2016 18:54        Scheduled Meds: . furosemide  80 mg Intravenous Q12H  . heparin  5,000 Units Subcutaneous Q8H  . hydrALAZINE  100 mg Oral TID  . insulin aspart  0-20 Units Subcutaneous TID WC  . insulin aspart  0-5 Units Subcutaneous QHS  . insulin aspart  3 Units Subcutaneous TID WC  . insulin glargine  60 Units Subcutaneous QHS  . living well with diabetes book   Does not apply Once  . mouth rinse  15 mL Mouth Rinse BID  . metoprolol succinate  50 mg Oral Daily  . pantoprazole  40 mg Oral Daily  . polyethylene glycol  17 g Oral Daily  . potassium chloride SA  20 mEq Oral BID  . pregabalin  150 mg Oral TID   Continuous Infusions:   LOS: 1 day        Glade Lloyd, MD [facility] Pager [phone]  If 7PM-7AM, please contact night-coverage [email or web address] Password [email or web address] 10/14/2016, 11:06 AM

## 2016-10-14 NOTE — Progress Notes (Signed)
Pt. States he can place cpap on himself when he is ready. RT informed pt. To notify if he needed any assistance. 

## 2016-10-14 NOTE — Progress Notes (Signed)
Inpatient Diabetes Program Recommendations  AACE/ADA: New Consensus Statement on Inpatient Glycemic Control (2015)  Target Ranges:  Prepandial:   less than 140 mg/dL      Peak postprandial:   less than 180 mg/dL (1-2 hours)      Critically ill patients:  140 - 180 mg/dL   Results for Lucky RathkeFISCHER, Adyan E (MRN [id number]) as of 10/14/2016 13:03  Ref. Range 10/13/2016 11:15 10/13/2016 16:08 10/13/2016 21:35 10/14/2016 07:42 10/14/2016 11:22  Glucose-Capillary Latest Ref Range: 65 - 99 mg/dL 147287 (H) 829256 (H) 562159 (H) 155 (H) 169 (H)   Results for Lucky RathkeFISCHER, Jonel E (MRN [id number]) as of 10/14/2016 13:03  Ref. Range 10/13/2016 02:36  Hemoglobin A1C Latest Ref Range: 4.8 - 5.6 % 11.2 (H)    Review of Glycemic Control  Diabetes history: DM2 Outpatient Diabetes medications: Tresiba 80 units QHS, Novolog 10-20 units TID with meals, Metformin 500 mg BID Current orders for Inpatient glycemic control: Lantus 60 units QHS, Novolog 3 units TID with meals, Novolog 0-20 units TID with meals, Novolog 0-5 units QHS  NOTE: Spoke with patient over the phone about diabetes and home regimen for diabetes control. Patient reports that he is followed by PCP for diabetes management.   Patient reports that he is taking insulin as prescribed.  Patient reports that no changes were made with his insulin at his last office visit but his PCP informed patient that a referral was going to be [facility] for assistance with DM management. Patient reports that he has not been contacted by the endocrinologist office yet.  Patient states that he checks his glucose 3-4 times per day and over the past 1 week his glucose ranged from 134-285 mg/dl.   Discussed A1C results (11.2% on 15/8/18). Discussed glucose and A1C goals. Discussed importance of checking CBGs and maintaining good CBG control to prevent long-term and short-term complications. Explained how hyperglycemia leads to damage within blood vessels which lead to the common  complications seen with uncontrolled diabetes. Stressed to the patient the importance of improving glycemic control to prevent further complications from uncontrolled diabetes. Discussed impact of nutrition, exercise, stress, sickness, and medications on diabetes control. Patient reports that he has been under a lot of stress lately and stated that he just recently lost his brother and his sister within the past few months.  Patient also reports that due to his neuropathy he is not able to stay on his feet long enough to cook meals so he eats take out food most of the time. Discussed carbohydrates, carbohydrate goals per day and meal, along with portion sizes. Informed patient a consult for RD would be ordered today. Encouraged patient to check his glucose 4 times per day (before meals and at bedtime) and to keep a log book of glucose readings and insulin taken which he will need to take to doctor appointments. Explained how the doctor he follows up with can use the log book to continue to make insulin adjustments if needed. Encouraged patient to also call his PCP about Endocrinologist referral if he does not hear from [facility] this week.  Patient verbalized understanding of information discussed and he states that he has no further questions at this time related to diabetes. Will continue to follow along and make recommendations if needed.   Thanks, Orlando PennerMarie Jentzen Minasyan, RN, MSN, CDE Diabetes Coordinator Inpatient Diabetes Program [phone] (Team Pager)

## 2016-10-15 LAB — CBC WITH DIFFERENTIAL/PLATELET
BASOS ABS: 0 10*3/uL (ref 0.0–0.1)
BASOS PCT: 0 %
EOS ABS: 0.2 10*3/uL (ref 0.0–0.7)
EOS PCT: 2 %
HCT: 42.9 % (ref 39.0–52.0)
Hemoglobin: 13.7 g/dL (ref 13.0–17.0)
LYMPHS PCT: 24 %
Lymphs Abs: 1.9 10*3/uL (ref 0.7–4.0)
MCH: 27.1 pg (ref 26.0–34.0)
MCHC: 31.9 g/dL (ref 30.0–36.0)
MCV: 84.8 fL (ref 78.0–100.0)
Monocytes Absolute: 0.7 10*3/uL (ref 0.1–1.0)
Monocytes Relative: 9 %
Neutro Abs: 5.1 10*3/uL (ref 1.7–7.7)
Neutrophils Relative %: 65 %
PLATELETS: 266 10*3/uL (ref 150–400)
RBC: 5.06 MIL/uL (ref 4.22–5.81)
RDW: 15.9 % — ABNORMAL HIGH (ref 11.5–15.5)
WBC: 7.9 10*3/uL (ref 4.0–10.5)

## 2016-10-15 LAB — GLUCOSE, CAPILLARY: Glucose-Capillary: 237 mg/dL — ABNORMAL HIGH (ref 65–99)

## 2016-10-15 LAB — RENAL FUNCTION PANEL
ALBUMIN: 2.6 g/dL — AB (ref 3.5–5.0)
Anion gap: 10 (ref 5–15)
BUN: 21 mg/dL — AB (ref 6–20)
CALCIUM: 8.5 mg/dL — AB (ref 8.9–10.3)
CO2: 30 mmol/L (ref 22–32)
CREATININE: 2.58 mg/dL — AB (ref 0.61–1.24)
Chloride: 96 mmol/L — ABNORMAL LOW (ref 101–111)
GFR, EST AFRICAN AMERICAN: 32 mL/min — AB (ref >60)
GFR, EST NON AFRICAN AMERICAN: 28 mL/min — AB (ref >60)
Glucose, Bld: 346 mg/dL — ABNORMAL HIGH (ref 65–99)
Phosphorus: 3.3 mg/dL (ref 2.5–4.6)
Potassium: 3.7 mmol/L (ref 3.5–5.1)
SODIUM: 136 mmol/L (ref 135–145)

## 2016-10-15 LAB — MAGNESIUM: Magnesium: 2 mg/dL (ref 1.7–2.4)

## 2016-10-15 MED ORDER — FUROSEMIDE 80 MG PO TABS: 80.0000 mg | ORAL_TABLET | Freq: Two times a day (BID) | ORAL | 0 refills | Status: DC

## 2016-10-15 NOTE — Progress Notes (Signed)
  RD consulted for nutrition education regarding diabetes.   Lab Results  Component Value Date   HGBA1C 11.2 (H) 10/13/2016    RD provided "Carbohydrate Counting for People with Diabetes" handout from the Academy of Nutrition and Dietetics. Discussed different food groups and their effects on blood sugar, emphasizing carbohydrate-containing foods. Provided list of carbohydrates and recommended serving sizes of common foods. Pt currently drinks sugar sweetened beverages and eats lots of sweets; discussed limiting these items and reviewed alternatives.   Discussed importance of controlled and consistent carbohydrate intake throughout the day. Pt reports he forgets to eat; pt carries cell phone with him, encouraged pt to set alarm to remind him to eat. Provided examples of ways to balance meals/snacks and encouraged intake of high-fiber, whole grain complex carbohydrates. Teach back method used.  Expect fair compliance. Pt may benefit from further education as outpatient  Body mass index is 47.89 kg/m. Pt meets criteria for morbid obesity based on current BMI.  Pt being discharged to home today. Reviewed/Reinforced CHF education from yesterday; pt remembered our strategies for lifestyle changes to assist pt with adhering to diet. Utilizing stool/counter-height chair while cooking,  limiting take out and fast food but utilizing strategies for reducing sodium when eating out (substitutions, sauces on side, etc), reviewed easy to cook meal ideas.   Romelle Starcherate Crysten Kaman MS, RD, LDN [phone] Pager  [phone] Weekend/On-Call Pager

## 2016-10-15 NOTE — Progress Notes (Signed)
S: No new CO O:BP (!) 145/81 (BP Location: Right Arm)   Pulse 64   Temp 98.3 F (36.8 C) (Oral)   Resp 20   Ht 5\' 11"  (1.803 m)   Wt (!) 155.8 kg (343 lb 6.4 oz) Comment: scale a  SpO2 97%   BMI 47.89 kg/m   Intake/Output Summary (Last 24 hours) at 10/15/16 0902 Last data filed at 10/15/16 0536  Gross per 24 hour  Intake              535 ml  Output             1850 ml  Net            -1315 ml   Weight change:  AVW:UJWJXGen:awake and alert CVS:RRR Resp:clear Abd:+ BS NTND Ext:2+ edema NEURO:CNI Ox3 no asterixis   . furosemide  80 mg Intravenous Q12H  . heparin  5,000 Units Subcutaneous Q8H  . hydrALAZINE  100 mg Oral TID  . insulin aspart  0-20 Units Subcutaneous TID WC  . insulin aspart  0-5 Units Subcutaneous QHS  . insulin aspart  3 Units Subcutaneous TID WC  . insulin glargine  60 Units Subcutaneous QHS  . mouth rinse  15 mL Mouth Rinse BID  . metoprolol succinate  50 mg Oral Daily  . pantoprazole  40 mg Oral Daily  . polyethylene glycol  17 g Oral Daily  . potassium chloride SA  20 mEq Oral BID  . pregabalin  150 mg Oral TID   No results found. BMET    Component Value Date/Time   NA 136 10/15/2016 0408   K 3.7 10/15/2016 0408   CL 96 (L) 10/15/2016 0408   CO2 30 10/15/2016 0408   GLUCOSE 346 (H) 10/15/2016 0408   BUN 21 (H) 10/15/2016 0408   CREATININE 2.58 (H) 10/15/2016 0408   CALCIUM 8.5 (L) 10/15/2016 0408   GFRNONAA 28 (L) 10/15/2016 0408   GFRAA 32 (L) 10/15/2016 0408   CBC    Component Value Date/Time   WBC 7.9 10/15/2016 0408   RBC 5.06 10/15/2016 0408   HGB 13.7 10/15/2016 0408   HCT 42.9 10/15/2016 0408   PLT 266 10/15/2016 0408   MCV 84.8 10/15/2016 0408   MCH 27.1 10/15/2016 0408   MCHC 31.9 10/15/2016 0408   RDW 15.9 (H) 10/15/2016 0408   LYMPHSABS 1.9 10/15/2016 0408   MONOABS 0.7 10/15/2016 0408   EOSABS 0.2 10/15/2016 0408   BASOSABS 0.0 10/15/2016 0408     Assessment:  1. CKD 3 sec DM/HTN 2. HTN 3. DM 4. OSA 5. Volume  overload  Plan: 1. OK for Dc today.  I have arranged labs to be done on Monday 2. He has FU appt with Dr Kathrene BongoGoldsborough May 30th 2pm 3. Cont with lasix 80mg  po BID for now and this can be adjusted as outpt   Leila Schuff T

## 2016-10-15 NOTE — Discharge Summary (Signed)
Physician Discharge Summary  Kenneth Fox YIF:[id number] DOB: Nov 02, 1970 DOA: 10/13/2016  PCP: Kenneth Rosser, PA-C  Admit date: 10/13/2016 Discharge date: 10/15/2016  Admitted From:Home Disposition:  Home  Recommendations for Outpatient Follow-up:  1. Follow up with PCP in 1-2 weeks 2. Please obtain BMP next week at Dr. Shelva Majestic office  3.   Please follow up with Dr. Moshe Cipro as scheduled  4.   Comply with medications and follow-up  Home Health: Home nursing and physical therapy evaluation and follow-up Equipment/Devices: None  Discharge Condition: Stable CODE STATUS: Full Diet recommendation: Heart Healthy / Carb Modified  Brief/Interim Summary: 46 y.o.malewith history of diastolic CHF last EF measured in December 2017 was 55-60%, diabetes mellitus type 2, sleep apnea not compliant with CPAP due to mask not fitting properly, chronic kidney disease stage III and morbid obesity presents to the ER because of worsening shortness of breath and lower extremity edema, Apparently his Lasix was stopped recently due to worsening renal function and since then he has gained about 16 pounds of weight, was admitted for acute on chronic diastolic CHF. His symptoms are improving gradually. Patient was evaluated by nephrology and Lasix was continued at 80 mg IV twice a day. He feels better and nephrology has cleared the patient for discharge on oral Lasix with outpatient follow-up with nephrology. Patient was counseled about compliance with medications and follow-up  Discharge Diagnoses:  Principal Problem:   Acute on chronic diastolic CHF (congestive heart failure) ([place]) Active Problems:   Essential hypertension   Obstructive sleep apnea   Uncontrolled type 2 diabetes mellitus with diabetic neuropathy, with long-term current use of insulin (HCC)   Morbid obesity due to excess calories (HCC)   Fluid overload   CHF (congestive heart failure) ([place])   1. Acute on chronic diastolic CHF. Was  treated with IV Lasix 80 mg twice a day inpatient. - Nephrology evaluation appreciated. Continue with oral Lasix 80 mg by mouth twice a day upon discharge and follow up with Dr. Moshe Cipro as scheduled with repeat BMP to be done next week - continue salt and fluid restriction, daily weight, monitor intake and output -Continue metoprolol and hydralazine. Might need outpatient cardiology follow-up  2.Morbid obesity with obstructive sleep apnea. Counseled on CPAP compliance, continue CPAP daily at bedtime, follow with PCP for weight loss.  3. CKDstage IV. Baseline creatinine close to 2,  -Creatinine slightly worsened today. Outpatient follow-up with Dr. Moshe Cipro  4.Essential hypertension -currently on combination of diuretic, beta blocker, and hydralazine. Monitor blood pressure  5.GERD. Continue PPI.   6. DM type II uncontrolled with hyperglycemia: Hemoglobin A1c is 11.2. Resume home insulin regimen. Patient might benefit from outpatient endocrinology follow-up.  7. Hypokalemia: Improving, outpatient BMP follow-up   Discharge Instructions  Discharge Instructions    Call MD for:  difficulty breathing, headache or visual disturbances    Complete by:  As directed    Call MD for:  extreme fatigue    Complete by:  As directed    Call MD for:  persistant dizziness or light-headedness    Complete by:  As directed    Call MD for:  persistant nausea and vomiting    Complete by:  As directed    Call MD for:  severe uncontrolled pain    Complete by:  As directed    Call MD for:  temperature >100.4    Complete by:  As directed    Diet - low sodium heart healthy    Complete by:  As  directed    Diet Carb Modified    Complete by:  As directed    Discharge instructions    Complete by:  As directed    Comply with medications and follow-up   Increase activity slowly    Complete by:  As directed      Allergies as of 10/15/2016      Reactions   Angiotensin Receptor Blockers  Other (See Comments)   Acute renal failure in patient w R heart failure   Lisinopril Other (See Comments)   Patient developed AKI after being on lisinopril for a week.      Medication List    STOP taking these medications   dicyclomine 20 MG tablet Commonly known as:  BENTYL   gabapentin 600 MG tablet Commonly known as:  NEURONTIN   metFORMIN 500 MG tablet Commonly known as:  GLUCOPHAGE   metoCLOPramide 10 MG tablet Commonly known as:  REGLAN   promethazine 12.5 MG tablet Commonly known as:  PHENERGAN     TAKE these medications   blood glucose meter kit and supplies Dispense based on patient and insurance preference. Use up to four times daily as directed. (FOR ICD-9 250.00, 250.01).   furosemide 80 MG tablet Commonly known as:  LASIX Take 1 tablet (80 mg total) by mouth 2 (two) times daily.   hydrALAZINE 50 MG tablet Commonly known as:  APRESOLINE Take 1 tablet (50 mg total) by mouth 3 (three) times daily.   insulin aspart 100 UNIT/ML injection Commonly known as:  novoLOG Inject 10-20 Units into the skin 3 (three) times daily with meals. per sliding scale CBG 70 - 120: 0 units CBG 121 - 150: 2 units CBG 151 - 200: 3 units CBG 201 - 250: 5 units CBG 251 - 300: 8 units CBG 301 - 350: 11 units CBG 351 - 400: 15 units   insulin detemir 100 UNIT/ML injection Commonly known as:  LEVEMIR Inject 0.8 mLs (80 Units total) into the skin every evening.   ketoconazole 2 % cream Commonly known as:  NIZORAL Apply 1 application topically daily.   lidocaine 5 % ointment Commonly known as:  XYLOCAINE Apply 1 application topically 2 (two) times daily as needed for pain.   metoprolol succinate 50 MG 24 hr tablet Commonly known as:  TOPROL-XL Take 50 mg by mouth daily.   Oxycodone HCl 10 MG Tabs Take 1 tablet by mouth every 6 (six) hours as needed (pain).   pantoprazole 40 MG tablet Commonly known as:  PROTONIX Take 40 mg by mouth daily.   polyethylene glycol  packet Commonly known as:  MIRALAX / GLYCOLAX Take 17 g by mouth daily.   potassium chloride SA 20 MEQ tablet Commonly known as:  K-DUR,KLOR-CON Take 20 mEq by mouth daily.   pregabalin 150 MG capsule Commonly known as:  LYRICA Take 150 mg by mouth 3 (three) times daily.   TRESIBA FLEXTOUCH 200 UNIT/ML Sopn Generic drug:  Insulin Degludec Inject 80 Units into the skin every evening.      Follow-up Information    [facility] Follow up.   Specialty:  Home Health Services Why:  They will do your home health care at your home Contact information: [place] [address] [phone]        Long, [name], PA-C Follow up in 1 week(s).   Specialty:  Physician Assistant Contact information: [address] [place] [address] [phone]  Corliss Parish, MD. Go in 1 week(s).   Specialty:  Nephrology Contact information: [place] [address] [phone]          Allergies  Allergen Reactions  . Angiotensin Receptor Blockers Other (See Comments)    Acute renal failure in patient w R heart failure  . Lisinopril Other (See Comments)    Patient developed AKI after being on lisinopril for a week.    Consultations:  Nephrology/Dr. Mercy Moore   Procedures/Studies: Dg Chest 2 View  Result Date: 10/12/2016 CLINICAL DATA:  Shortness of breath for 8 months EXAM: CHEST  2 VIEW COMPARISON:  February 20, 2016, June 27, 2015, March 08, 2014 FINDINGS: The heart size and mediastinal contours are stable. The heart size is enlarged. There is opacity of the right lung base possibly due to scarring/atelectasis but developed since 2015 and increased since January 2017. There is no focal pneumonia, pulmonary edema, or pleural effusion. The visualized skeletal structures are unremarkable. IMPRESSION: No active cardiopulmonary disease. There is opacity of the right lung base possibly due to scarring/atelectasis but  developed since 2015 and increased since January 2017. Consider further evaluation with chest CT on outpatient basis. Electronically Signed   By: Abelardo Diesel M.D.   On: 10/12/2016 18:54       Subjective: Patient seen and examined at bedside. He feels better and wants to go home. He denies any overnight fever, nausea, vomiting  Discharge Exam: Vitals:   10/15/16 0922 10/15/16 0926  BP: (!) 154/75   Pulse:  78  Resp:    Temp:     Vitals:   10/14/16 2106 10/14/16 2121 10/15/16 0922 10/15/16 0926  BP: (!) 145/81  (!) 154/75   Pulse: 64   78  Resp: 20     Temp: 98.3 F (36.8 C)     TempSrc: Oral     SpO2: 91% 97%    Weight:      Height:        General: Alert, awake  Cardiovascular: S1-S2 positive, rate controlled  Respiratory: Bilateral decreased breath sounds bases with some scattered crackles Abdominal: Soft, morbidly obese, nontender bowel sounds positive Extremities: No cyanosis, clubbing. 1-2+ pitting edema present  The results of significant diagnostics from this hospitalization (including imaging, microbiology, ancillary and laboratory) are listed below for reference.     Microbiology: No results found for this or any previous visit (from the past 240 hour(s)).   Labs: BNP (last 3 results)  Recent Labs  02/20/16 1703 05/08/16 2305 10/12/16 1805  BNP 20.0 13.0 81.8   Basic Metabolic Panel:  Recent Labs Lab 10/12/16 1757 10/13/16 0236 10/14/16 0249 10/15/16 0408  NA 139 141 142 136  K 3.8 3.5 3.4* 3.7  CL 104 105 101 96*  CO2 24 29 32 30  GLUCOSE 208* 219* 157* 346*  BUN _0 21*  CREATININE 2.05* 1.93* 2.36* 2.58*  CALCIUM 8.7* 8.4* 8.8* 8.5*  MG  --   --   --  2.0  PHOS  --   --   --  3.3   Liver Function Tests:  Recent Labs Lab 10/15/16 0408  ALBUMIN 2.6*   No results for input(s): LIPASE, AMYLASE in the last 168 hours. No results for input(s): AMMONIA in the last 168 hours. CBC:  Recent Labs Lab 10/12/16 1757 10/13/16 0236  10/15/16 0408  WBC 8.7 8.0 7.9  NEUTROABS  --   --  5.1  HGB 13.2 12.6* 13.7  HCT 40.8 40.0 42.9  MCV 84.0 84.2 84.8  PLT 247 247 266   Cardiac Enzymes:  Recent Labs Lab 10/13/16 0808  TROPONINI <0.03   BNP: Invalid input(s): POCBNP CBG:  Recent Labs Lab 10/14/16 0742 10/14/16 1122 10/14/16 1611 10/14/16 2050 10/15/16 0746  GLUCAP 155* 169* 95 166* 237*   D-Dimer No results for input(s): DDIMER in the last 72 hours. Hgb A1c  Recent Labs  10/13/16 0236  HGBA1C 11.2*   Lipid Profile No results for input(s): CHOL, HDL, LDLCALC, TRIG, CHOLHDL, LDLDIRECT in the last 72 hours. Thyroid function studies  Recent Labs  10/13/16 0236  TSH 2.329   Anemia work up No results for input(s): VITAMINB12, FOLATE, FERRITIN, TIBC, IRON, RETICCTPCT in the last 72 hours. Urinalysis    Component Value Date/Time   COLORURINE STRAW (A) 05/09/2016 2252   APPEARANCEUR CLEAR 05/09/2016 2252   LABSPEC 1.013 05/09/2016 2252   PHURINE 5.0 05/09/2016 2252   GLUCOSEU 250 (A) 05/09/2016 2252   HGBUR TRACE (A) 05/09/2016 2252   BILIRUBINUR NEGATIVE 05/09/2016 2252   KETONESUR NEGATIVE 05/09/2016 2252   PROTEINUR >300 (A) 05/09/2016 2252   UROBILINOGEN 1.0 01/17/2015 2230   NITRITE NEGATIVE 05/09/2016 2252   LEUKOCYTESUR NEGATIVE 05/09/2016 2252   Sepsis Labs Invalid input(s): PROCALCITONIN,  WBC,  LACTICIDVEN Microbiology No results found for this or any previous visit (from the past 240 hour(s)).   Time coordinating discharge: 35 minutes  SIGNED:   Aline August, MD  [facility] 10/15/2016, 9:55 AM Pager: [phone]  If 7PM-7AM, please contact night-coverage [email or web address] Password [email or web address]

## 2016-10-15 NOTE — Discharge Instructions (Signed)
Acute Kidney Injury, Adult Acute kidney injury is a sudden worsening of kidney function. The kidneys are organs that have several jobs. They filter the blood to remove waste products and extra fluid. They also maintain a healthy balance of minerals and hormones in the body, which helps control blood pressure and keep bones strong. With this condition, your kidneys do not do their jobs as well as they should. This condition ranges from mild to severe. Over time it may develop into long-lasting (chronic) kidney disease. Early detection and treatment may prevent acute kidney injury from developing into a chronic condition. What are the causes? Common causes of this condition include:  A problem with blood flow to the kidneys. This may be caused by:  Low blood pressure (hypotension) or shock.  Blood loss.  Heart and blood vessel (cardiovascular) disease.  Severe burns.  Liver disease.  Direct damage to the kidneys. This may be caused by:  Certain medicines.  A kidney infection.  Poisoning.  Being around or in contact with toxic substances.  A surgical wound.  A hard, direct hit to the kidney area.  A sudden blockage of urine flow. This may be caused by:  Cancer.  Kidney stones.  An enlarged prostate in males. What are the signs or symptoms? Symptoms of this condition may not be obvious until the condition becomes severe. Symptoms of this condition can include:  Tiredness (lethargy), or difficulty staying awake.  Nausea or vomiting.  Swelling (edema) of the face, legs, ankles, or feet.  Problems with urination, such as:  Abdominal pain, or pain along the side of your stomach (flank).  Decreased urine production.  Decrease in the force of urine flow.  Muscle twitches and cramps, especially in the legs.  Confusion or trouble concentrating.  Loss of appetite.  Fever. How is this diagnosed? This condition may be diagnosed with tests, including:  Blood  tests.  Urine tests.  Imaging tests.  A test in which a sample of tissue is removed from the kidneys to be examined under a microscope (kidney biopsy). How is this treated? Treatment for this condition depends on the cause and how severe the condition is. In mild cases, treatment may not be needed. The kidneys may heal on their own. In more severe cases, treatment will involve:  Treating the cause of the kidney injury. This may involve changing any medicines you are taking or adjusting your dosage.  Fluids. You may need specialized IV fluids to balance your body's needs.  Having a catheter placed to drain urine and prevent blockages.  Preventing problems from occurring. This may mean avoiding certain medicines or procedures that can cause further injury to the kidneys. In some cases treatment may also require:  A procedure to remove toxic wastes from the body (dialysis or continuous renal replacement therapy - CRRT).  Surgery. This may be done to repair a torn kidney, or to remove the blockage from the urinary system. Follow these instructions at home: Medicines   Take over-the-counter and prescription medicines only as told by your health care provider.  Do not take any new medicines without your health care provider's approval. Many medicines can worsen your kidney damage.  Do not take any vitamin and mineral supplements without your health care provider's approval. Many nutritional supplements can worsen your kidney damage. Lifestyle   If your health care provider prescribed changes to your diet, follow them. You may need to decrease the amount of protein you eat.  Achieve and maintain a   healthy weight. If you need help with this, ask your health care provider.  Start or continue an exercise plan. Try to exercise at least 30 minutes a day, 5 days a week.  Do not use any tobacco products, such as cigarettes, chewing tobacco, and e-cigarettes. If you need help quitting, ask  your health care provider. General instructions   Keep track of your blood pressure. Report changes in your blood pressure as told by your health care provider.  Stay up to date with immunizations. Ask your health care provider which immunizations you need.  Keep all follow-up visits as told by your health care provider. This is important. Where to find more information:  American Association of Kidney Patients: www.aakp.org  National Kidney Foundation: www.kidney.org  American Kidney Fund: www.akfinc.org  Life Options Rehabilitation Program:  www.lifeoptions.org  www.kidneyschool.org Contact a health care provider if:  Your symptoms get worse.  You develop new symptoms. Get help right away if:  You develop symptoms of worsening kidney disease, which include:  Headaches.  Abnormally dark or light skin.  Easy bruising.  Frequent hiccups.  Chest pain.  Shortness of breath.  End of menstruation in women.  Seizures.  Confusion or altered mental status.  Abdominal or back pain.  Itchiness.  You have a fever.  Your body is producing less urine.  You have pain or bleeding when you urinate. Summary  Acute kidney injury is a sudden worsening of kidney function.  Acute kidney injury can be caused by problems with blood flow to the kidneys, direct damage to the kidneys, and sudden blockage of urine flow.  Symptoms of this condition may not be obvious until it becomes severe. Symptoms may include edema, lethargy, confusion, nausea or vomiting, and problems passing urine.  This condition can usually be diagnosed with blood tests, urine tests, and imaging tests. Sometimes a kidney biopsy is done to diagnose this condition.  Treatment for this condition often involves treating the underlying cause. It is treated with fluids, medicines, dialysis, diet changes, or surgery. This information is not intended to replace advice given to you by your health care provider.  Make sure you discuss any questions you have with your health care provider. Document Released: 12/08/2010 Document Revised: 05/15/2016 Document Reviewed: 05/15/2016 Elsevier Interactive Patient Education  2017 Elsevier Inc.  

## 2016-10-15 NOTE — Progress Notes (Signed)
Patient discharged to home with all his belongings.  Spouse here to pick up.  No complaints at the time of discharge. Elnita MaxwellSalome N Rosea Dory, RN

## 2016-10-15 NOTE — Care Management Note (Signed)
Case Management Note  Patient Details  Name: Kenneth Fox E Tenenbaum MRN: [id number] Date of Birth: 1971-03-16  Subjective/Objective: Admitted with CHF                  Action/Plan: Patient lives at home alone, separated from his spouse; [name]; has private insurance with Medicare / Medicaid with prescription drug coverage; [facility]; Patient states that he eats out a lot because his legs are weak and it is hard for him to stand in the kitchen. CM informed patient to put a chair in the kitchen for rest breaks when he cooks; He could benefit from a Disease Management program for CHF for ongoing teaching. HHC choice offered, patient chose [facility]; [name] with [facility] called for arrangements.  Expected Discharge Date:  10/15/16               Expected Discharge Plan:  Home w Home Health Services  Discharge planning Services  CM Consult  Choice offered to:  Patient  HH Arranged:  RN, Disease Management, PT [facility]  Status of Service:  In process, will continue to follow  Reola MosherChandler, Cordarrel Stiefel L, RN,MHA,BSN [phone] 10/15/2016, 9:54 AM

## 2016-10-21 ENCOUNTER — Inpatient Hospital Stay (HOSPITAL_COMMUNITY)
Admission: EM | Admit: 2016-10-21 | Discharge: 2016-10-26 | DRG: 291 | Disposition: A | Discharge status: Home Health | Payer: Medicare Other | Attending: Internal Medicine | Admitting: Internal Medicine

## 2016-10-21 ENCOUNTER — Emergency Department (HOSPITAL_COMMUNITY): Payer: Medicare Other

## 2016-10-21 DIAGNOSIS — Z794 Long term (current) use of insulin: Secondary | ICD-10-CM | POA: Diagnosis not present

## 2016-10-21 DIAGNOSIS — N183 Chronic kidney disease, stage 3 (moderate): Secondary | ICD-10-CM | POA: Diagnosis not present

## 2016-10-21 DIAGNOSIS — I50810 Right heart failure, unspecified: Secondary | ICD-10-CM | POA: Diagnosis present

## 2016-10-21 DIAGNOSIS — E872 Acidosis: Secondary | ICD-10-CM | POA: Diagnosis not present

## 2016-10-21 DIAGNOSIS — I509 Heart failure, unspecified: Secondary | ICD-10-CM | POA: Diagnosis not present

## 2016-10-21 DIAGNOSIS — E876 Hypokalemia: Secondary | ICD-10-CM | POA: Diagnosis present

## 2016-10-21 DIAGNOSIS — Z841 Family history of disorders of kidney and ureter: Secondary | ICD-10-CM

## 2016-10-21 DIAGNOSIS — E1165 Type 2 diabetes mellitus with hyperglycemia: Secondary | ICD-10-CM | POA: Diagnosis not present

## 2016-10-21 DIAGNOSIS — N179 Acute kidney failure, unspecified: Secondary | ICD-10-CM | POA: Diagnosis not present

## 2016-10-21 DIAGNOSIS — K219 Gastro-esophageal reflux disease without esophagitis: Secondary | ICD-10-CM | POA: Diagnosis present

## 2016-10-21 DIAGNOSIS — E1143 Type 2 diabetes mellitus with diabetic autonomic (poly)neuropathy: Secondary | ICD-10-CM | POA: Diagnosis present

## 2016-10-21 DIAGNOSIS — I5033 Acute on chronic diastolic (congestive) heart failure: Secondary | ICD-10-CM | POA: Diagnosis present

## 2016-10-21 DIAGNOSIS — Z833 Family history of diabetes mellitus: Secondary | ICD-10-CM

## 2016-10-21 DIAGNOSIS — E1122 Type 2 diabetes mellitus with diabetic chronic kidney disease: Secondary | ICD-10-CM | POA: Diagnosis present

## 2016-10-21 DIAGNOSIS — Z9989 Dependence on other enabling machines and devices: Secondary | ICD-10-CM

## 2016-10-21 DIAGNOSIS — J9811 Atelectasis: Secondary | ICD-10-CM | POA: Diagnosis present

## 2016-10-21 DIAGNOSIS — G4733 Obstructive sleep apnea (adult) (pediatric): Secondary | ICD-10-CM | POA: Diagnosis present

## 2016-10-21 DIAGNOSIS — E1142 Type 2 diabetes mellitus with diabetic polyneuropathy: Secondary | ICD-10-CM | POA: Diagnosis present

## 2016-10-21 DIAGNOSIS — D573 Sickle-cell trait: Secondary | ICD-10-CM | POA: Diagnosis present

## 2016-10-21 DIAGNOSIS — I10 Essential (primary) hypertension: Secondary | ICD-10-CM | POA: Diagnosis present

## 2016-10-21 DIAGNOSIS — J449 Chronic obstructive pulmonary disease, unspecified: Secondary | ICD-10-CM | POA: Diagnosis present

## 2016-10-21 DIAGNOSIS — Z6841 Body Mass Index (BMI) 40.0 and over, adult: Secondary | ICD-10-CM

## 2016-10-21 DIAGNOSIS — E1322 Other specified diabetes mellitus with diabetic chronic kidney disease: Secondary | ICD-10-CM | POA: Diagnosis not present

## 2016-10-21 DIAGNOSIS — G9341 Metabolic encephalopathy: Secondary | ICD-10-CM | POA: Diagnosis not present

## 2016-10-21 DIAGNOSIS — J9601 Acute respiratory failure with hypoxia: Secondary | ICD-10-CM | POA: Diagnosis present

## 2016-10-21 DIAGNOSIS — J9600 Acute respiratory failure, unspecified whether with hypoxia or hypercapnia: Secondary | ICD-10-CM | POA: Diagnosis not present

## 2016-10-21 DIAGNOSIS — J9622 Acute and chronic respiratory failure with hypercapnia: Secondary | ICD-10-CM | POA: Diagnosis present

## 2016-10-21 DIAGNOSIS — E1140 Type 2 diabetes mellitus with diabetic neuropathy, unspecified: Secondary | ICD-10-CM | POA: Diagnosis not present

## 2016-10-21 DIAGNOSIS — Z9111 Patient's noncompliance with dietary regimen: Secondary | ICD-10-CM

## 2016-10-21 DIAGNOSIS — E8770 Fluid overload, unspecified: Secondary | ICD-10-CM

## 2016-10-21 DIAGNOSIS — Z9981 Dependence on supplemental oxygen: Secondary | ICD-10-CM

## 2016-10-21 DIAGNOSIS — J9621 Acute and chronic respiratory failure with hypoxia: Secondary | ICD-10-CM | POA: Diagnosis not present

## 2016-10-21 DIAGNOSIS — I130 Hypertensive heart and chronic kidney disease with heart failure and stage 1 through stage 4 chronic kidney disease, or unspecified chronic kidney disease: Secondary | ICD-10-CM | POA: Diagnosis not present

## 2016-10-21 DIAGNOSIS — N178 Other acute kidney failure: Secondary | ICD-10-CM | POA: Diagnosis not present

## 2016-10-21 DIAGNOSIS — J9602 Acute respiratory failure with hypercapnia: Secondary | ICD-10-CM

## 2016-10-21 DIAGNOSIS — E11649 Type 2 diabetes mellitus with hypoglycemia without coma: Secondary | ICD-10-CM | POA: Diagnosis present

## 2016-10-21 DIAGNOSIS — K3184 Gastroparesis: Secondary | ICD-10-CM | POA: Diagnosis present

## 2016-10-21 DIAGNOSIS — Z9119 Patient's noncompliance with other medical treatment and regimen: Secondary | ICD-10-CM

## 2016-10-21 DIAGNOSIS — Z8249 Family history of ischemic heart disease and other diseases of the circulatory system: Secondary | ICD-10-CM

## 2016-10-21 DIAGNOSIS — E119 Type 2 diabetes mellitus without complications: Secondary | ICD-10-CM

## 2016-10-21 DIAGNOSIS — Z832 Family history of diseases of the blood and blood-forming organs and certain disorders involving the immune mechanism: Secondary | ICD-10-CM

## 2016-10-21 LAB — RENAL FUNCTION PANEL
Albumin: 2.6 g/dL — ABNORMAL LOW (ref 3.5–5.0)
Anion gap: 7 (ref 5–15)
BUN: 20 mg/dL (ref 6–20)
CHLORIDE: 103 mmol/L (ref 101–111)
CO2: 30 mmol/L (ref 22–32)
Calcium: 8.3 mg/dL — ABNORMAL LOW (ref 8.9–10.3)
Creatinine, Ser: 2.11 mg/dL — ABNORMAL HIGH (ref 0.61–1.24)
GFR calc Af Amer: 41 mL/min — ABNORMAL LOW (ref >60)
GFR calc non Af Amer: 36 mL/min — ABNORMAL LOW (ref >60)
GLUCOSE: 146 mg/dL — AB (ref 65–99)
POTASSIUM: 4.2 mmol/L (ref 3.5–5.1)
Phosphorus: 3.3 mg/dL (ref 2.5–4.6)
Sodium: 140 mmol/L (ref 135–145)

## 2016-10-21 LAB — I-STAT VENOUS BLOOD GAS, ED
ACID-BASE EXCESS: 11 mmol/L — AB (ref 0.0–2.0)
Bicarbonate: 38 mmol/L — ABNORMAL HIGH (ref 20.0–28.0)
O2 Saturation: 95 %
PO2 VEN: 76 mmHg — AB (ref 32.0–45.0)
TCO2: 40 mmol/L (ref 0–100)
pCO2, Ven: 58.1 mmHg (ref 44.0–60.0)
pH, Ven: 7.424 (ref 7.250–7.430)

## 2016-10-21 LAB — CBC WITH DIFFERENTIAL/PLATELET
Basophils Absolute: 0 10*3/uL (ref 0.0–0.1)
Basophils Relative: 0 %
EOS ABS: 0.2 10*3/uL (ref 0.0–0.7)
EOS PCT: 2 %
HCT: 41.3 % (ref 39.0–52.0)
Hemoglobin: 13 g/dL (ref 13.0–17.0)
LYMPHS ABS: 2.4 10*3/uL (ref 0.7–4.0)
Lymphocytes Relative: 26 %
MCH: 26.9 pg (ref 26.0–34.0)
MCHC: 31.5 g/dL (ref 30.0–36.0)
MCV: 85.3 fL (ref 78.0–100.0)
MONO ABS: 0.9 10*3/uL (ref 0.1–1.0)
MONOS PCT: 10 %
Neutro Abs: 5.6 10*3/uL (ref 1.7–7.7)
Neutrophils Relative %: 62 %
PLATELETS: 251 10*3/uL (ref 150–400)
RBC: 4.84 MIL/uL (ref 4.22–5.81)
RDW: 15.6 % — AB (ref 11.5–15.5)
WBC: 9.1 10*3/uL (ref 4.0–10.5)

## 2016-10-21 LAB — COMPREHENSIVE METABOLIC PANEL
ALK PHOS: 96 U/L (ref 38–126)
ALT: 17 U/L (ref 17–63)
ANION GAP: 6 (ref 5–15)
AST: 24 U/L (ref 15–41)
Albumin: 2.5 g/dL — ABNORMAL LOW (ref 3.5–5.0)
BUN: 21 mg/dL — ABNORMAL HIGH (ref 6–20)
CALCIUM: 8.2 mg/dL — AB (ref 8.9–10.3)
CO2: 32 mmol/L (ref 22–32)
Chloride: 105 mmol/L (ref 101–111)
Creatinine, Ser: 2.23 mg/dL — ABNORMAL HIGH (ref 0.61–1.24)
GFR calc non Af Amer: 33 mL/min — ABNORMAL LOW (ref >60)
GFR, EST AFRICAN AMERICAN: 39 mL/min — AB (ref >60)
Glucose, Bld: 55 mg/dL — ABNORMAL LOW (ref 65–99)
Potassium: 3.4 mmol/L — ABNORMAL LOW (ref 3.5–5.1)
Sodium: 143 mmol/L (ref 135–145)
Total Bilirubin: 0.5 mg/dL (ref 0.3–1.2)
Total Protein: 6.3 g/dL — ABNORMAL LOW (ref 6.5–8.1)

## 2016-10-21 LAB — I-STAT ARTERIAL BLOOD GAS, ED
Acid-Base Excess: 4 mmol/L — ABNORMAL HIGH (ref 0.0–2.0)
BICARBONATE: 32.6 mmol/L — AB (ref 20.0–28.0)
O2 Saturation: 97 %
PCO2 ART: 69.4 mmHg — AB (ref 32.0–48.0)
TCO2: 35 mmol/L (ref 0–100)
pH, Arterial: 7.28 — ABNORMAL LOW (ref 7.350–7.450)
pO2, Arterial: 105 mmHg (ref 83.0–108.0)

## 2016-10-21 LAB — PHOSPHORUS: Phosphorus: 3.3 mg/dL (ref 2.5–4.6)

## 2016-10-21 LAB — POCT I-STAT 3, ART BLOOD GAS (G3+)
Acid-Base Excess: 2 mmol/L (ref 0.0–2.0)
Bicarbonate: 29.7 mmol/L — ABNORMAL HIGH (ref 20.0–28.0)
O2 SAT: 99 %
PCO2 ART: 57.6 mmHg — AB (ref 32.0–48.0)
PH ART: 7.321 — AB (ref 7.350–7.450)
TCO2: 31 mmol/L (ref 0–100)
pO2, Arterial: 146 mmHg — ABNORMAL HIGH (ref 83.0–108.0)

## 2016-10-21 LAB — GLUCOSE, CAPILLARY
GLUCOSE-CAPILLARY: 128 mg/dL — AB (ref 65–99)
GLUCOSE-CAPILLARY: 200 mg/dL — AB (ref 65–99)

## 2016-10-21 LAB — I-STAT TROPONIN, ED: Troponin i, poc: 0 ng/mL (ref 0.00–0.08)

## 2016-10-21 LAB — CBG MONITORING, ED: GLUCOSE-CAPILLARY: 91 mg/dL (ref 65–99)

## 2016-10-21 LAB — TROPONIN I

## 2016-10-21 LAB — PROCALCITONIN: Procalcitonin: 0.35 ng/mL

## 2016-10-21 LAB — MAGNESIUM: Magnesium: 2 mg/dL (ref 1.7–2.4)

## 2016-10-21 LAB — BRAIN NATRIURETIC PEPTIDE: B NATRIURETIC PEPTIDE 5: 57.4 pg/mL (ref 0.0–100.0)

## 2016-10-21 LAB — I-STAT CG4 LACTIC ACID, ED: Lactic Acid, Venous: 1.65 mmol/L (ref 0.5–1.9)

## 2016-10-21 LAB — LACTIC ACID, PLASMA: Lactic Acid, Venous: 2.3 mmol/L (ref 0.5–1.9)

## 2016-10-21 LAB — MRSA PCR SCREENING: MRSA BY PCR: NEGATIVE

## 2016-10-21 MED ORDER — FUROSEMIDE 10 MG/ML IJ SOLN
40.0000 mg | Freq: Four times a day (QID) | INTRAMUSCULAR | Status: AC
  Administered 2016-10-21 – 2016-10-22 (×4): 40 mg via INTRAVENOUS
  Filled 2016-10-21 (×3): qty 4

## 2016-10-21 MED ORDER — HEPARIN SODIUM (PORCINE) 5000 UNIT/ML IJ SOLN
5000.0000 [IU] | Freq: Three times a day (TID) | INTRAMUSCULAR | Status: DC
  Administered 2016-10-21 – 2016-10-26 (×13): 5000 [IU] via SUBCUTANEOUS
  Filled 2016-10-21 (×14): qty 1

## 2016-10-21 MED ORDER — FUROSEMIDE 10 MG/ML IJ SOLN
40.0000 mg | Freq: Once | INTRAMUSCULAR | Status: DC
  Filled 2016-10-21: qty 4

## 2016-10-21 MED ORDER — IPRATROPIUM-ALBUTEROL 0.5-2.5 (3) MG/3ML IN SOLN
3.0000 mL | Freq: Once | RESPIRATORY_TRACT | Status: AC
  Administered 2016-10-21: 3 mL via RESPIRATORY_TRACT
  Filled 2016-10-21: qty 3

## 2016-10-21 MED ORDER — PANTOPRAZOLE SODIUM 40 MG IV SOLR
40.0000 mg | Freq: Every day | INTRAVENOUS | Status: DC
  Administered 2016-10-21: 40 mg via INTRAVENOUS
  Filled 2016-10-21: qty 40

## 2016-10-21 MED ORDER — HYDRALAZINE HCL 20 MG/ML IJ SOLN
10.0000 mg | INTRAMUSCULAR | Status: DC | PRN
  Administered 2016-10-22: 10 mg via INTRAVENOUS
  Filled 2016-10-21: qty 1

## 2016-10-21 MED ORDER — INSULIN ASPART 100 UNIT/ML ~~LOC~~ SOLN
1.0000 [IU] | SUBCUTANEOUS | Status: DC | PRN
  Administered 2016-10-22: 1 [IU] via SUBCUTANEOUS
  Filled 2016-10-21: qty 0.03

## 2016-10-21 MED ORDER — SODIUM CHLORIDE 0.9 % IV SOLN
250.0000 mL | INTRAVENOUS | Status: DC | PRN
  Administered 2016-10-21: 250 mL via INTRAVENOUS

## 2016-10-21 MED ORDER — METHYLPREDNISOLONE SODIUM SUCC 125 MG IJ SOLR
125.0000 mg | Freq: Once | INTRAMUSCULAR | Status: AC
Start: 2016-10-21 — End: 2016-10-21
  Administered 2016-10-21: 125 mg via INTRAVENOUS
  Filled 2016-10-21: qty 2

## 2016-10-21 NOTE — ED Notes (Signed)
Pt noted as less responsive. Pt rouses and answers questions but return to sleeping with snoring resp. IV has been pulled from left arm.

## 2016-10-21 NOTE — ED Notes (Signed)
Dr. Thurnell LoseYow aware of glucose. Given OJ with sugar. No nausea with fluids. Pt states he has not eaten since yesterday but took his metformin

## 2016-10-21 NOTE — ED Provider Notes (Signed)
[facility] DEPT Provider Note   CSN: [id number] Arrival date & time: 10/21/16  1307     History   Chief Complaint Chief Complaint  Patient presents with  . Respiratory Distress    HPI Kenneth Fox is a 46 y.o. male Who presents with concern for fluid over load.  He reports that he is concerned that the fluid pills that he is taking are making him dehydrated and concerned for his kidneys.  He has a hx of CHF, DM and fluid overload.  He was recently discharged on 5/10 after he was found to be in fluid overload.  His lasix had been previously stopped due to kidney damage, however he was recently told to restart his lasix, 14m BID.  He reports compliance with his lasix.  Reports that today he attempted to drive here for evaluation.  RN reports that patient had to be "pulled from his car" [place] [facility]  At home is normally on 3 liters of oxygen, however he does not have a portable tank only a concentrator and drove here with out oxygen.  He complains his chest feels tight, and that he is unable to breathe.      HPI  Past Medical History:  Diagnosis Date  . Asthma   . CHF (congestive heart failure) ([place]   . Diabetes ([place]   . Gastroparesis   . GERD (gastroesophageal reflux disease)   . Hypertension   . Kidney disorder   . Neuropathy   . Obesity   . Obstructive sleep apnea    will not wear CPAP  . Renal insufficiency   . Sickle cell trait ([facility]     Patient Active Problem List   Diagnosis Date Noted  . Fluid overload 10/13/2016  . Acute on chronic diastolic CHF (congestive heart failure) ([place] 10/13/2016  . CHF (congestive heart failure) ([facility] 10/13/2016  . Acute kidney injury ([place] 05/09/2016  . Dysphagia 12/12/2015  . Intractable nausea and vomiting 12/11/2015  . Leukocytosis 06/02/2015  . Morbid obesity due to excess calories ([place] 06/02/2015  . Uncontrolled type 2 diabetes mellitus with diabetic neuropathy, with long-term current  use of insulin ([facility]   . Elevated troponin 06/01/2015  . AKI (acute kidney injury) ([place] 12/03/2013  . Adverse reaction to lisinopril 12/03/2013  . Hypokalemia 11/25/2013  . Diabetic gastroparesis ([place] 11/17/2013  . Obstructive sleep apnea 11/07/2012  . Essential hypertension 05/18/2011    Past Surgical History:  Procedure Laterality Date  . HIP FRACTURE SURGERY Right 1999   "put a plate in"       Home Medications    Prior to Admission medications   Medication Sig Start Date End Date Taking? Authorizing Provider  blood glucose meter kit and supplies Dispense based on patient and insurance preference. Use up to four times daily as directed. (FOR ICD-9 250.00, 250.01). 05/11/16   GPatrecia Pour MD  furosemide (LASIX) 80 MG tablet Take 1 tablet (80 mg total) by mouth 2 (two) times daily. 10/15/16 11/14/16  AAline August MD  hydrALAZINE (APRESOLINE) 50 MG tablet Take 1 tablet (50 mg total) by mouth 3 (three) times daily. 06/06/15   DRobbie Lis MD  insulin aspart (NOVOLOG) 100 UNIT/ML injection Inject 10-20 Units into the skin 3 (three) times daily with meals. per sliding scale CBG 70 - 120: 0 units CBG 121 - 150: 2 units CBG 151 - 200: 3 units CBG 201 - 250: 5 units CBG 251 - 300:  8 units CBG 301 - 350: 11 units CBG 351 - 400: 15 units 06/06/15   Robbie Lis, MD  Insulin Degludec (TRESIBA FLEXTOUCH) 200 UNIT/ML SOPN Inject 80 Units into the skin every evening.    [provider]  insulin detemir (LEVEMIR) 100 UNIT/ML injection Inject 0.8 mLs (80 Units total) into the skin every evening. 12/13/15   Rai, Vernelle Emerald, MD  ketoconazole (NIZORAL) 2 % cream Apply 1 application topically daily.    [provider]  lidocaine (XYLOCAINE) 5 % ointment Apply 1 application topically 2 (two) times daily as needed for pain. 04/15/16   [provider]  metoprolol succinate (TOPROL-XL) 50 MG 24 hr tablet Take 50 mg by mouth daily. 10/18/15   [provider]    Oxycodone HCl 10 MG TABS Take 1 tablet by mouth every 6 (six) hours as needed (pain).  04/17/14   [provider]  pantoprazole (PROTONIX) 40 MG tablet Take 40 mg by mouth daily.    [provider]  polyethylene glycol (MIRALAX / GLYCOLAX) packet Take 17 g by mouth daily.    [provider]  potassium chloride SA (K-DUR,KLOR-CON) 20 MEQ tablet Take 20 mEq by mouth daily.    [provider]  pregabalin (LYRICA) 150 MG capsule Take 150 mg by mouth 3 (three) times daily.    [provider]    Family History Family History  Problem Relation Age of Onset  . Sickle cell anemia Mother   . Heart attack Mother   . Diabetes Father   . Neuropathy Father   . Hypertension Father   . Aneurysm Father   . Kidney disease Brother   . Kidney disease Sister     Social History Social History  Substance Use Topics  . Smoking status: Never Smoker  . Smokeless tobacco: Never Used  . Alcohol use No     Allergies   Angiotensin receptor blockers and Lisinopril   Review of Systems Review of Systems  Constitutional: Positive for fatigue. Negative for chills and fever.  HENT: Negative for congestion and sore throat.   Respiratory: Positive for cough, chest tightness and shortness of breath. Negative for apnea and wheezing.   Cardiovascular: Positive for chest pain and leg swelling. Negative for palpitations.  Gastrointestinal: Negative for abdominal distention, abdominal pain, diarrhea, nausea and vomiting.  Endocrine: Positive for polyuria.  Genitourinary: Negative for difficulty urinating and hematuria.  Musculoskeletal: Negative for back pain and neck pain.  Skin: Negative for color change.  Neurological: Positive for weakness and headaches. Negative for numbness.     Physical Exam Updated Vital Signs BP (!) 165/88   Pulse 80   Temp 97.8 F (36.6 C) (Oral)   Resp 19   Ht 5' 11"  (1.803 m)   Wt (!) 145.2 kg   SpO2 94%   BMI 44.63 kg/m    Physical Exam  Constitutional: He is oriented to person, place, and time. He appears well-developed and well-nourished.  HENT:  Head: Normocephalic and atraumatic.  Mouth/Throat: No oropharyngeal exudate.  Eyes: Conjunctivae are normal. Right eye exhibits discharge. No scleral icterus.  Green discharge noted to right eye  Neck: Normal range of motion. No JVD (Unable to accurately assess secondary to body habbitus) present.  Cardiovascular: Normal rate, regular rhythm, normal heart sounds and intact distal pulses.   No murmur heard. Pulmonary/Chest: Accessory muscle usage present. No stridor. Tachypnea noted. He has decreased breath sounds. He has no wheezes. He has no rhonchi. He has no  rales.  Abdominal: Soft. Bowel sounds are normal. He exhibits no distension. There is no tenderness. There is no guarding.  Abdomen is grossly obese without obvious pitting edema or ascites  Musculoskeletal: He exhibits no deformity.  Neurological: He is alert and oriented to person, place, and time. No sensory deficit. He exhibits normal muscle tone. GCS eye subscore is 4. GCS verbal subscore is 5. GCS motor subscore is 6.  Skin: Skin is warm and dry. No rash noted. He is not diaphoretic.  Psychiatric: He has a normal mood and affect. His behavior is normal.  Nursing note and vitals reviewed.    ED Treatments / Results  Labs (all labs ordered are listed, but only abnormal results are displayed) Labs Reviewed  CBC WITH DIFFERENTIAL/PLATELET - Abnormal; Notable for the following:       Result Value   RDW 15.6 (*)    All other components within normal limits  COMPREHENSIVE METABOLIC PANEL - Abnormal; Notable for the following:    Potassium 3.4 (*)    Glucose, Bld 55 (*)    BUN 21 (*)    Creatinine, Ser 2.23 (*)    Calcium 8.2 (*)    Total Protein 6.3 (*)    Albumin 2.5 (*)    GFR calc non Af Amer 33 (*)    GFR calc Af Amer 39 (*)    All other components within normal limits  I-STAT VENOUS  BLOOD GAS, ED - Abnormal; Notable for the following:    pO2, Ven 76.0 (*)    Bicarbonate 38.0 (*)    Acid-Base Excess 11.0 (*)    All other components within normal limits  I-STAT ARTERIAL BLOOD GAS, ED - Abnormal; Notable for the following:    pH, Arterial 7.280 (*)    pCO2 arterial 69.4 (*)    Bicarbonate 32.6 (*)    Acid-Base Excess 4.0 (*)    All other components within normal limits  CULTURE, BLOOD (ROUTINE X 2)  CULTURE, BLOOD (ROUTINE X 2)  BRAIN NATRIURETIC PEPTIDE  BLOOD GAS, VENOUS  URINALYSIS, ROUTINE W REFLEX MICROSCOPIC  I-STAT TROPOININ, ED  CBG MONITORING, ED  I-STAT CG4 LACTIC ACID, ED    EKG  EKG Interpretation None       Radiology Dg Chest Portable 1 View  Result Date: 10/21/2016 CLINICAL DATA:  Shortness of breath, history of sickle cell trait, asthma, diabetes. EXAM: PORTABLE CHEST 1 VIEW COMPARISON:  Chest x-ray of Oct 12, 2016 FINDINGS: The right lung is mildly hypoinflated. There is persistent increased density above the right hemidiaphragm. The left lung is better inflated. Both lungs exhibit mildly increased interstitial markings. The cardiac silhouette is enlarged. The central pulmonary vascularity is mildly prominent. There is calcification in the wall of the aortic arch. IMPRESSION: Increased density at the right lung base similar to that seen 9 days ago consistent with atelectasis or less likely scarring. Further evaluation with chest CT scanning is again recommended recommended. Cardiomegaly with mild pulmonary vascular prominence consistent with compensated CHF. Thoracic aortic atherosclerosis. Electronically Signed   By: David  Martinique M.D.   On: 10/21/2016 13:48    Procedures Procedures  CRITICAL CARE Performed by: Wyn Quaker Total critical care time: 31 minutes Critical care time was exclusive of separately billable procedures and treating other patients. Critical care was necessary to treat or prevent imminent or life-threatening  deterioration. Critical care was time spent personally by me on the following activities: development of treatment plan with patient and/or surrogate as well as nursing,  discussions with consultants, evaluation of patient's response to treatment, examination of patient, obtaining history from patient or surrogate, ordering and performing treatments and interventions, ordering and review of laboratory studies, ordering and review of radiographic studies, pulse oximetry and re-evaluation of patient's condition.   Respiratory failure/severe CHF  Medications Ordered in ED Medications  furosemide (LASIX) injection 40 mg (not administered)  methylPREDNISolone sodium succinate (SOLU-MEDROL) 125 mg/2 mL injection 125 mg (125 mg Intravenous Given 10/21/16 1545)  ipratropium-albuterol (DUONEB) 0.5-2.5 (3) MG/3ML nebulizer solution 3 mL (3 mLs Nebulization Given 10/21/16 1535)     Initial Impression / Assessment and Plan / ED Course  I have reviewed the triage vital signs and the nursing notes.  Pertinent labs & imaging results that were available during my care of the patient were reviewed by me and considered in my medical decision making (see chart for details).  Clinical Course as of Oct 21 1617  Wed Oct 21, 2016  1516 Spoke with Marily Memos with triad, will do steroids, neb treatment and attempt to ambulate patient after.   [EH]  1540 Decreased alertness, blood sugar ok, will order ABG and bipap.   [EH]  1550 Spoke with the intensivist from [place], states will have ICU team come take a look at patient.  [EH]    Clinical Course User Index [EH] Lorin Glass, PA-C   Melanie Crazier Miskell presents with increased work of breathing, shortness of breath and chest tightness.  While in the ED he was noted to be hypoglycemic and was given juice which increased his sugar.  Initially believed to be fluid overloaded due to history and physical.  BNP resulted at 57.4.  Initial VBG was reassuring, normal PH, very  slight elevation of pCO2 with excess pO2.  Initial troponin negative.  Patient continued to become more short of breath, ordered neb and steroid.  Patient failed to improve, blood sugar normal.  Patient was re-evaluated, found to be less alert, placed on bipap and obtained ABG showing pH of 7.280, PCO2 69.4, Bicarb 32.6, PO2 105.0.  Discussed patient with the intensivist who stated ICU team would come evaluate the patient.  Due to the acuity of the patient Dr. Darl Householder was involved in the patients care from arrival and continued to be heavily involved through the entire ED course.   At shift change care was transferred to C. Lawyer PA-C who will follow pending studies, re-evaulate and determine disposition.     Final Clinical Impressions(s) / ED Diagnoses   Final diagnoses:  Acute respiratory failure with hypoxia [facility])    New Prescriptions New Prescriptions   No medications on file     Ollen Gross 10/21/16 1619    Drenda Freeze, MD 10/22/16 [phone]

## 2016-10-21 NOTE — Progress Notes (Signed)
CRITICAL VALUE ALERT  Critical value received:  Lactic acid 2.3   Date of notification:  10/21/16  Time of notification:  10:04 PM  Critical value read back:Yes.    Nurse who received alert:  Lucretia Roersachel wilson RN  MD notified (1st page):  Dr. Arsenio LoaderSommer  Time of first page:  10:06 PM  No new orders at this time

## 2016-10-21 NOTE — Progress Notes (Signed)
Pt transported on 100% ventilator to 2M08. Pt transported with no complications.

## 2016-10-21 NOTE — H&P (Signed)
PULMONARY / CRITICAL CARE MEDICINE   Name: Kenneth Fox MRN: [id number] DOB: March 21, 1971    ADMISSION DATE:  10/21/2016 CONSULTATION DATE:  10/21/2016  REFERRING MD:  Dr. Darl Householder (EDP)  CHIEF COMPLAINT:  SOB  HISTORY OF PRESENT ILLNESS:  Pt is encephalopathic and therefore HPI obtained from chart review. 46 year old male w/PMH significant for chronic diastolic heart failure (last EF 55-60 on 05/10/16 ), baseline home O2 on 3L, CKD 3, HTN, OSA non compliant with CPAP, uncontrolled DM type II, diabetic neuropathy, and morbid obesity admitted to [facility] with acute respiratory failure in the setting of volume overload.  Of note, he was recently discharged on 5/10 with acute on chronic diastolic heart failure.  He is followed by Dr. Moshe Cipro.  He apparently drove himself to the ED complaining of shortness of breath, chest pain, and with concerns of fluid overload.  He reported compliance with his lasix but was concerned it was making him dehydrated.  He had to be "pulled" from the car.  Additionally he reported he is on 3L [place] at home but does not have a portable tank, just a concentrator.  Vitals stable, afebrile.  Labs notable for a Na 143, K 3.4, Cl 105, glucose 55, BUN 21, sCr 2.23, albumin 2.5, BNP 57.4, WBC 9.1, hgb 13, hct 41, plt 251, normal troponin, EKG pending, CXR consistent with RLL atelectasis (similar to xray 9 days ago), cardiomegaly with mild pulmonary edema.  Initially, he was alert enough to be treated orally with juice to get his glucose up.  However, soon after, he was found to be obtunded and confused,  ABG showed 7.28/69.4/32.6.  Treated with solumedrol and duoneb for increasing shortness of breath and then placed on BiPAP.  PCCM called for admission.    PAST MEDICAL HISTORY :  He  has a past medical history of Asthma; CHF (congestive heart failure) ([place]); Diabetes ([place]); Gastroparesis; GERD (gastroesophageal reflux disease); Hypertension; Kidney disorder; Neuropathy; Obesity;  Obstructive sleep apnea; Renal insufficiency; and Sickle cell trait ([place]).  PAST SURGICAL HISTORY: He  has a past surgical history that includes Hip fracture surgery (Right, 1999).  Allergies  Allergen Reactions  . Angiotensin Receptor Blockers Other (See Comments)    Acute renal failure in patient w R heart failure  . Lisinopril Other (See Comments)    Patient developed AKI after being on lisinopril for a week.    No current facility-administered medications on file prior to encounter.    Current Outpatient Prescriptions on File Prior to Encounter  Medication Sig  . blood glucose meter kit and supplies Dispense based on patient and insurance preference. Use up to four times daily as directed. (FOR ICD-9 250.00, 250.01).  . furosemide (LASIX) 80 MG tablet Take 1 tablet (80 mg total) by mouth 2 (two) times daily.  . hydrALAZINE (APRESOLINE) 50 MG tablet Take 1 tablet (50 mg total) by mouth 3 (three) times daily.  . insulin aspart (NOVOLOG) 100 UNIT/ML injection Inject 10-20 Units into the skin 3 (three) times daily with meals. per sliding scale CBG 70 - 120: 0 units CBG 121 - 150: 2 units CBG 151 - 200: 3 units CBG 201 - 250: 5 units CBG 251 - 300: 8 units CBG 301 - 350: 11 units CBG 351 - 400: 15 units  . Insulin Degludec (TRESIBA FLEXTOUCH) 200 UNIT/ML SOPN Inject 80 Units into the skin every evening.  . insulin detemir (LEVEMIR) 100 UNIT/ML injection Inject 0.8 mLs (80 Units total) into the skin every  evening.  [name] ketoconazole (NIZORAL) 2 % cream Apply 1 application topically daily.  [name] lidocaine (XYLOCAINE) 5 % ointment Apply 1 application topically 2 (two) times daily as needed for pain.  . metoprolol succinate (TOPROL-XL) 50 MG 24 hr tablet Take 50 mg by mouth daily.  . Oxycodone HCl 10 MG TABS Take 1 tablet by mouth every 6 (six) hours as needed (pain).   . pantoprazole (PROTONIX) 40 MG tablet Take 40 mg by mouth daily.  . polyethylene glycol (MIRALAX / GLYCOLAX) packet Take 17  g by mouth daily.  . potassium chloride SA (K-DUR,KLOR-CON) 20 MEQ tablet Take 20 mEq by mouth daily.  . pregabalin (LYRICA) 150 MG capsule Take 150 mg by mouth 3 (three) times daily.    FAMILY HISTORY:  His indicated that his mother is deceased. He indicated that his father is deceased. He indicated that two of his three sisters are alive. He indicated that two of his three brothers are alive. He indicated that his daughter is alive. He indicated that all of his three sons are alive.    SOCIAL HISTORY: He  reports that he has never smoked. He has never used smokeless tobacco. He reports that he does not drink alcohol or use drugs.  REVIEW OF SYSTEMS:  Unable to assess secondary to altered mental status   SUBJECTIVE:  Pt denies SOB or chest pain   VITAL SIGNS: BP (!) 165/88   Pulse 80   Temp 97.8 F (36.6 C) (Oral)   Resp 19   Ht 5\' 11"  (1.803 m)   Wt (!) 320 lb (145.2 kg)   SpO2 94%   BMI 44.63 kg/m   HEMODYNAMICS:    VENTILATOR SETTINGS: Vent Mode: BIPAP FiO2 (%):  [40 %] 40 % Set Rate:  [15 bmp] 15 bmp PEEP:  [6 cmH20] 6 cmH20  INTAKE / OUTPUT: No intake/output data recorded.  PHYSICAL EXAMINATION: General:  Obese male lying in bed in NAD HEENT: MM pink/dry, thick neck, +JVD, Pupils 2/ reactive  Neuro: Somnolent, awakens but falls asleep in conversation, oriented to person and place, follows commands, MAE,  CV: s1s2 rrr, distant, no m/r/g PULM: even/non-labored on BiPAP, intermittent snoring, lungs bilaterally clear, diminished in bases, no wheezing GI: obese, Taunt, non-tender, bsx4 active  Extremities: cool /dry, +3  pitting edema extending to abd and BLE Skin: no rashes or lesions  LABS:  BMET  Recent Labs Lab 10/15/16 0408 10/21/16 1344  NA 136 143  K 3.7 3.4*  CL 96* 105  CO2 30 32  BUN 21* 21*  CREATININE 2.58* 2.23*  GLUCOSE 346* 55*    Electrolytes  Recent Labs Lab 10/15/16 0408 10/21/16 1344  CALCIUM 8.5* 8.2*  MG 2.0  --   PHOS  3.3  --     CBC  Recent Labs Lab 10/15/16 0408 10/21/16 1344  WBC 7.9 9.1  HGB 13.7 13.0  HCT 42.9 41.3  PLT 266 251    Coag's No results for input(s): APTT, INR in the last 168 hours.  Sepsis Markers No results for input(s): LATICACIDVEN, PROCALCITON, O2SATVEN in the last 168 hours.  ABG  Recent Labs Lab 10/21/16 1545  PHART 7.280*  PCO2ART 69.4*  PO2ART 105.0    Liver Enzymes  Recent Labs Lab 10/15/16 0408 10/21/16 1344  AST  --  24  ALT  --  17  ALKPHOS  --  96  BILITOT  --  0.5  ALBUMIN 2.6* 2.5*    Cardiac Enzymes No results for input(s):  TROPONINI, PROBNP in the last 168 hours.  Glucose  Recent Labs Lab 10/14/16 2050 10/15/16 0746 10/21/16 1536  GLUCAP 166* 237* 91    Imaging Dg Chest Portable 1 View  Result Date: 10/21/2016 CLINICAL DATA:  Shortness of breath, history of sickle cell trait, asthma, diabetes. EXAM: PORTABLE CHEST 1 VIEW COMPARISON:  Chest x-ray of Oct 12, 2016 FINDINGS: The right lung is mildly hypoinflated. There is persistent increased density above the right hemidiaphragm. The left lung is better inflated. Both lungs exhibit mildly increased interstitial markings. The cardiac silhouette is enlarged. The central pulmonary vascularity is mildly prominent. There is calcification in the wall of the aortic arch. IMPRESSION: Increased density at the right lung base similar to that seen 9 days ago consistent with atelectasis or less likely scarring. Further evaluation with chest CT scanning is again recommended recommended. Cardiomegaly with mild pulmonary vascular prominence consistent with compensated CHF. Thoracic aortic atherosclerosis. Electronically Signed   By: David  Martinique M.D.   On: 10/21/2016 13:48     STUDIES:  Echo >>  CULTURES: BCx 2 5/16 >>   ANTIBIOTICS: n/a  SIGNIFICANT EVENTS: 5/16 Admit with respiratory failure/ volume overload   LINES/TUBES: PIV  DISCUSSION: 40 yoM admitted with acute respiratory  failure in the setting of volume overload.  Unsure of compliance with meds at home.  Recent discharged on 5/10 for volume overload and acute on chronic diastolic HF.  ASSESSMENT / PLAN:  PULMONARY A: Acute hypercapnic respiratory failure - in the setting of OSA/ ?OHA, volume overload, pulmonary edema Respiratory acidosis- chronic component Hx ?asthma, OSA (non-compliant w/CPAP), home O2 3L P:   BiPAP  ABG in one hour Diuresis Close monitoring of airway for further compromise CXR in am  Hold on abx at this time as does not appear infectious   CARDIOVASCULAR A:  Acute on chronic Diastolic HF- volume overload Hx HTN P:  ICU monitoring Diuresis as below Holding home metoprolol and apresoline PO Apresoline IV PRN for SBP> 180 Trend troponin's x 3 Echo EKG in am  RENAL A:   CKD 3- home lasix 30m BID.  Discharge wt 5/10 155.8kg  Baseline sCr ~1.8 (3/18), discharge sCr was 2.58 on 5/10 Hypoalbuminemia Pseudohypocalcemia - corrects to 9.4 Hypokalemia  P:   Will need K replacement, will wait to see if he can come off BiPAP to avoid more fluid, if not, give IV Lasix 461mq 6 hrs Check renal panel at 2200 Trend BMP/ mag/ phos / urinary output Replace electrolytes as indicated Avoid nephrotoxic agents, ensure adequate renal perfusion Consider Nephrology input if fails diuresis    GASTROINTESTINAL A:   No acute issues P:   Protonix for SUP NPO for now  HEMATOLOGIC A:   No acute issues  P:  Heparin sq for DVT SCDs Trend CBC  INFECTIOUS A:   No acute issues - normal WBC, afebrile.  Does not appear infectious at this point P:   Check PCT for completeness Follow BC Follow fever curve/ trend WBC  ENDOCRINE A:   Poorly controlled DM type II Hypoglycemia  P:   CBG q 4hr SSI sensitive  Holding home levemir, tresiba  NEUROLOGIC A:   Acute encephalopathy -  Hx Diabetic neuropathy P:   Head CT when stable  Frequent neuro checks  Add UDS Holding home  lyrica   FAMILY  - Updates: No family at bedside 5/16.  - Inter-disciplinary family meet or Palliative Care meeting due by:  5/22  CCT 50 mins  BrJerene Pitch  Mariann Laster [facility] Pgr: [phone] or if no answer [phone] 10/21/2016, 5:23 PM

## 2016-10-21 NOTE — ED Triage Notes (Signed)
Pt drove himself to the ED entrance on no o2 pt normally wears 3L [place] at home. Pt states he is sob. Pt has labored respirations pt placed in stretcher placed on 3L [place] and MD Yao at bedside.

## 2016-10-21 NOTE — ED Notes (Signed)
ED Provider at bedside. 

## 2016-10-21 NOTE — ED Notes (Signed)
Family at bedside. 

## 2016-10-21 NOTE — Progress Notes (Signed)
eLink Physician-Brief Progress Note Patient Name: Kenneth Fox DOB: 1971-04-03 MRN: [id number]   Date of Service  10/21/2016  HPI/Events of Note  Lactic Acid = 1.65 >> 2.3. Likely d/t heart failure. CXR c/w pulmonary edema. Patient being diuresed. Will not give more fluid at this time.   eICU Interventions  Continue present management.      Intervention Category Major Interventions: Acid-Base disturbance - evaluation and management  Quoc Tome Eugene 10/21/2016, 10:07 PM

## 2016-10-21 NOTE — ED Notes (Signed)
Got patient undress into a gown on the monitor did ekg shown to Dr Silverio LayYao

## 2016-10-22 ENCOUNTER — Inpatient Hospital Stay (HOSPITAL_COMMUNITY): Payer: Medicare Other

## 2016-10-22 ENCOUNTER — Other Ambulatory Visit: Payer: Self-pay

## 2016-10-22 DIAGNOSIS — I509 Heart failure, unspecified: Secondary | ICD-10-CM

## 2016-10-22 DIAGNOSIS — J9600 Acute respiratory failure, unspecified whether with hypoxia or hypercapnia: Secondary | ICD-10-CM

## 2016-10-22 DIAGNOSIS — J9601 Acute respiratory failure with hypoxia: Secondary | ICD-10-CM

## 2016-10-22 LAB — GLUCOSE, CAPILLARY
GLUCOSE-CAPILLARY: 225 mg/dL — AB (ref 65–99)
GLUCOSE-CAPILLARY: 191 mg/dL — AB (ref 65–99)
Glucose-Capillary: 265 mg/dL — ABNORMAL HIGH (ref 65–99)
Glucose-Capillary: 180 mg/dL — ABNORMAL HIGH (ref 65–99)
Glucose-Capillary: 238 mg/dL — ABNORMAL HIGH (ref 65–99)

## 2016-10-22 LAB — URINALYSIS, ROUTINE W REFLEX MICROSCOPIC
BILIRUBIN URINE: NEGATIVE
Bacteria, UA: NONE SEEN
Glucose, UA: 150 mg/dL — AB
HGB URINE DIPSTICK: NEGATIVE
KETONES UR: NEGATIVE mg/dL
LEUKOCYTES UA: NEGATIVE
Nitrite: NEGATIVE
Protein, ur: >=300 mg/dL — AB
Specific Gravity, Urine: 1.008 (ref 1.005–1.030)
Squamous Epithelial / LPF: NONE SEEN
pH: 7 (ref 5.0–8.0)

## 2016-10-22 LAB — ECHOCARDIOGRAM COMPLETE
HEIGHTINCHES: 71 in
WEIGHTICAEL: 5495.63 [oz_av]

## 2016-10-22 LAB — RAPID URINE DRUG SCREEN, HOSP PERFORMED
Amphetamines: NOT DETECTED
BARBITURATES: NOT DETECTED
BENZODIAZEPINES: NOT DETECTED
COCAINE: NOT DETECTED
Opiates: NOT DETECTED
Tetrahydrocannabinol: NOT DETECTED

## 2016-10-22 LAB — TROPONIN I: Troponin I: <0.03 ng/mL (ref <0.03)

## 2016-10-22 LAB — PROCALCITONIN: Procalcitonin: 0.3 ng/mL

## 2016-10-22 MED ORDER — FENTANYL CITRATE (PF) 100 MCG/2ML IJ SOLN
75.0000 ug | Freq: Once | INTRAMUSCULAR | Status: AC
  Administered 2016-10-23: 75 ug via INTRAVENOUS
  Filled 2016-10-22: qty 2

## 2016-10-22 MED ORDER — FUROSEMIDE 10 MG/ML IJ SOLN: 40.0000 mg | Freq: Four times a day (QID) | INTRAMUSCULAR | Status: DC

## 2016-10-22 MED ORDER — INSULIN ASPART 100 UNIT/ML ~~LOC~~ SOLN
0.0000 [IU] | SUBCUTANEOUS | Status: DC
  Administered 2016-10-22: 3 [IU] via SUBCUTANEOUS
  Administered 2016-10-22: 8 [IU] via SUBCUTANEOUS
  Administered 2016-10-22: 5 [IU] via SUBCUTANEOUS

## 2016-10-22 MED ORDER — SODIUM CHLORIDE 0.9 % IV SOLN: INTRAVENOUS | Status: DC

## 2016-10-22 MED ORDER — PERFLUTREN LIPID MICROSPHERE
1.0000 mL | INTRAVENOUS | Status: AC | PRN
  Administered 2016-10-22: 4 mL via INTRAVENOUS
  Filled 2016-10-22: qty 10

## 2016-10-22 MED ORDER — INSULIN ASPART 100 UNIT/ML ~~LOC~~ SOLN
0.0000 [IU] | Freq: Three times a day (TID) | SUBCUTANEOUS | Status: DC
  Administered 2016-10-22: 5 [IU] via SUBCUTANEOUS
  Administered 2016-10-22 – 2016-10-23 (×3): 3 [IU] via SUBCUTANEOUS
  Administered 2016-10-23: 2 [IU] via SUBCUTANEOUS
  Administered 2016-10-24: 3 [IU] via SUBCUTANEOUS
  Administered 2016-10-24: 2 [IU] via SUBCUTANEOUS
  Administered 2016-10-24: 5 [IU] via SUBCUTANEOUS
  Administered 2016-10-24: 3 [IU] via SUBCUTANEOUS
  Administered 2016-10-25 (×2): 5 [IU] via SUBCUTANEOUS
  Administered 2016-10-25: 3 [IU] via SUBCUTANEOUS
  Administered 2016-10-26: 2 [IU] via SUBCUTANEOUS
  Administered 2016-10-26: 5 [IU] via SUBCUTANEOUS

## 2016-10-22 MED ORDER — METOPROLOL TARTRATE 25 MG/10 ML ORAL SUSPENSION
25.0000 mg | Freq: Two times a day (BID) | ORAL | Status: DC
  Filled 2016-10-22: qty 10

## 2016-10-22 MED ORDER — HYDRALAZINE HCL 50 MG PO TABS
50.0000 mg | ORAL_TABLET | Freq: Three times a day (TID) | ORAL | Status: DC
  Administered 2016-10-22 – 2016-10-26 (×12): 50 mg via ORAL
  Filled 2016-10-22 (×13): qty 1

## 2016-10-22 MED ORDER — METOPROLOL TARTRATE 25 MG PO TABS
25.0000 mg | ORAL_TABLET | Freq: Two times a day (BID) | ORAL | Status: DC
  Administered 2016-10-22 – 2016-10-26 (×9): 25 mg via ORAL
  Filled 2016-10-22 (×9): qty 1

## 2016-10-22 MED ORDER — PANTOPRAZOLE SODIUM 40 MG PO TBEC
40.0000 mg | DELAYED_RELEASE_TABLET | Freq: Every day | ORAL | Status: DC
  Administered 2016-10-22 – 2016-10-26 (×5): 40 mg via ORAL
  Filled 2016-10-22 (×5): qty 1

## 2016-10-22 MED ORDER — INSULIN DETEMIR 100 UNIT/ML ~~LOC~~ SOLN
20.0000 [IU] | Freq: Every day | SUBCUTANEOUS | Status: DC
  Administered 2016-10-22 – 2016-10-25 (×4): 20 [IU] via SUBCUTANEOUS
  Filled 2016-10-22 (×4): qty 0.2

## 2016-10-22 MED ORDER — FUROSEMIDE 10 MG/ML IJ SOLN
40.0000 mg | Freq: Four times a day (QID) | INTRAMUSCULAR | Status: AC
  Administered 2016-10-22 – 2016-10-23 (×3): 40 mg via INTRAVENOUS
  Filled 2016-10-22 (×3): qty 4

## 2016-10-22 NOTE — Progress Notes (Signed)
  Echocardiogram 2D Echocardiogram with Definity has been performed.  Leta JunglingCooper, Cheyanna Strick M 10/22/2016, 10:01 AM

## 2016-10-22 NOTE — Progress Notes (Signed)
Inpatient Diabetes Program Recommendations  AACE/ADA: New Consensus Statement on Inpatient Glycemic Control (2015)  Target Ranges:  Prepandial:   less than 140 mg/dL      Peak postprandial:   less than 180 mg/dL (1-2 hours)      Critically ill patients:  140 - 180 mg/dL   Lab Results  Component Value Date   GLUCAP 225 (H) 10/22/2016   HGBA1C 11.2 (H) 10/13/2016    Review of Glycemic Control:  Results for Kenneth Fox, Sher E (MRN [id number]) as of 10/22/2016 10:08  Ref. Range 10/21/2016 15:36 10/21/2016 19:55 10/21/2016 23:43 10/22/2016 03:50 10/22/2016 08:01  Glucose-Capillary Latest Ref Range: 65 - 99 mg/dL 91 865128 (H) 784200 (H) 696265 (H) 225 (H)   Diabetes history: Type 2 diabetes Outpatient Diabetes medications: Tresiba 80 units q PM , Novolog 10-20 units tid with meals, Levemir 80 units q PM-Note that per home rec. and d/c summary from 5/10, patient appears to be on 2 basal insulins.  This needs to be clarified with patient.  Current orders for Inpatient glycemic control:  Novolog moderate q 4 hours  Inpatient Diabetes Program Recommendations:    May consider adding a portion of patient's home dose of basal insulin.  Consider adding Levemir 20 units bid.   Thanks, Beryl MeagerJenny Katlyne Nishida, RN, BC-ADM Inpatient Diabetes Coordinator Pager [phone] (8a-5p)

## 2016-10-22 NOTE — Progress Notes (Signed)
Two nurses assessed for new IV, placed consult for IV team.

## 2016-10-22 NOTE — H&P (Signed)
PULMONARY / CRITICAL CARE MEDICINE   Name: Kenneth Fox MRN: [id number] DOB: 1970/10/04    ADMISSION DATE:  10/21/2016 CONSULTATION DATE:  10/21/2016  REFERRING MD:  Dr. Darl Householder (EDP)  CHIEF COMPLAINT:  SOB  HISTORY OF PRESENT ILLNESS:  Pt is encephalopathic and therefore HPI obtained from chart review. 46 year old male w/PMH significant for chronic diastolic heart failure (last EF 55-60 on 05/10/16 ), baseline home O2 on 3L, CKD 3, HTN, OSA non compliant with CPAP, uncontrolled DM type II, diabetic neuropathy, and morbid obesity admitted to [facility] with acute respiratory failure in the setting of volume overload. Of note, he was recently discharged on 5/10 with acute on chronic diastolic heart failure.  He apparently drove himself to the ED complaining of shortness of breath, chest pain, and with concerns of fluid overload.  He reported compliance with his lasix but was concerned it was making him dehydrated.  He had to be "pulled" from the car.  Additionally he reported he is on 3L [place] at home but does not have a portable tank, just a concentrator.  Vitals stable, afebrile.  Labs notable for a Na 143, K 3.4, Cl 105, glucose 55, BUN 21, sCr 2.23, albumin 2.5, BNP 57.4, WBC 9.1, hgb 13, hct 41, plt 251, normal troponin, EKG pending, CXR consistent with RLL atelectasis (similar to xray 9 days ago), cardiomegaly with mild pulmonary edema.  Initially, he was alert enough to be treated orally with juice to get his glucose up.  However, soon after, he was found to be obtunded and confused,  ABG showed 7.28/69.4/32.6.  Treated with solumedrol and duoneb for increasing shortness of breath and then placed on BiPAP.  PCCM called for admission.    PAST MEDICAL HISTORY :  He  has a past medical history of Asthma; CHF (congestive heart failure) ([place]); Diabetes ([place]); Gastroparesis; GERD (gastroesophageal reflux disease); Hypertension; Kidney disorder; Neuropathy; Obesity; Obstructive sleep apnea; Renal insufficiency;  and Sickle cell trait ([place]).  PAST SURGICAL HISTORY: He  has a past surgical history that includes Hip fracture surgery (Right, 1999).  Allergies  Allergen Reactions  . Angiotensin Receptor Blockers Other (See Comments)    Acute renal failure in patient w R heart failure  . Lisinopril Other (See Comments)    Patient developed AKI after being on lisinopril for a week.    No current facility-administered medications on file prior to encounter.    Current Outpatient Prescriptions on File Prior to Encounter  Medication Sig  . furosemide (LASIX) 80 MG tablet Take 1 tablet (80 mg total) by mouth 2 (two) times daily.  . Insulin Degludec (TRESIBA FLEXTOUCH) 200 UNIT/ML SOPN Inject 80 Units into the skin every evening.  . Oxycodone HCl 10 MG TABS Take 1 tablet by mouth every 6 (six) hours as needed (pain).   . pregabalin (LYRICA) 150 MG capsule Take 150 mg by mouth 3 (three) times daily.  . blood glucose meter kit and supplies Dispense based on patient and insurance preference. Use up to four times daily as directed. (FOR ICD-9 250.00, 250.01).  . hydrALAZINE (APRESOLINE) 50 MG tablet Take 1 tablet (50 mg total) by mouth 3 (three) times daily.  . insulin aspart (NOVOLOG) 100 UNIT/ML injection Inject 10-20 Units into the skin 3 (three) times daily with meals. per sliding scale CBG 70 - 120: 0 units CBG 121 - 150: 2 units CBG 151 - 200: 3 units CBG 201 - 250: 5 units CBG 251 - 300: 8 units CBG 301 -  350: 11 units CBG 351 - 400: 15 units  . insulin detemir (LEVEMIR) 100 UNIT/ML injection Inject 0.8 mLs (80 Units total) into the skin every evening.  [name] ketoconazole (NIZORAL) 2 % cream Apply 1 application topically daily.  [name] lidocaine (XYLOCAINE) 5 % ointment Apply 1 application topically 2 (two) times daily as needed for pain.  . metoprolol succinate (TOPROL-XL) 50 MG 24 hr tablet Take 50 mg by mouth daily.  . pantoprazole (PROTONIX) 40 MG tablet Take 40 mg by mouth daily.  . polyethylene  glycol (MIRALAX / GLYCOLAX) packet Take 17 g by mouth daily.  . potassium chloride SA (K-DUR,KLOR-CON) 20 MEQ tablet Take 20 mEq by mouth daily.    FAMILY HISTORY:  His indicated that his mother is deceased. He indicated that his father is deceased. He indicated that two of his three sisters are alive. He indicated that two of his three brothers are alive. He indicated that his daughter is alive. He indicated that all of his three sons are alive.    SOCIAL HISTORY: He  reports that he has never smoked. He has never used smokeless tobacco. He reports that he does not drink alcohol or use drugs.  REVIEW OF SYSTEMS:  Unable to assess secondary to altered mental status   SUBJECTIVE:  Pt denies SOB or chest pain   VITAL SIGNS: BP 119/76   Pulse 68   Temp 97.7 F (36.5 C) (Oral)   Resp 16   Ht 5' 11"  (1.803 m)   Wt (!) 155.8 kg (343 lb 7.6 oz)   SpO2 97%   BMI 47.91 kg/m   HEMODYNAMICS:    VENTILATOR SETTINGS: Vent Mode: BIPAP;PCV FiO2 (%):  [40 %-60 %] 60 % Set Rate:  [15 bmp] 15 bmp PEEP:  [6 cmH20] 6 cmH20  INTAKE / OUTPUT: I/O last 3 completed shifts: In: 150 [Other:150] Out: 1700 [Urine:1700]  PHYSICAL EXAMINATION: General:  Obese male on BiPAP in NAD HEENT: MM pink/dry, thick neck, PERRL Neuro: Easily arouses, alert, oriented, non-focal CV: s1s2 rrr, distant, no m/r/g PULM: even/non-labored on 2L (I took BiPAP off). Distant clear breath sounds GI: obese, Taunt, non-tender, bsx4 active  Extremities: No acute deformity. +3 peripheral edema.  Skin: no rashes or lesions.  LABS:  BMET  Recent Labs Lab 10/21/16 1344 10/21/16 2034  NA 143 140  K 3.4* 4.2  CL 105 103  CO2 32 30  BUN 21* 20  CREATININE 2.23* 2.11*  GLUCOSE 55* 146*    Electrolytes  Recent Labs Lab 10/21/16 1344 10/21/16 2034  CALCIUM 8.2* 8.3*  MG  --  2.0  PHOS  --  3.3  3.3    CBC  Recent Labs Lab 10/21/16 1344  WBC 9.1  HGB 13.0  HCT 41.3  PLT 251    Coag's No  results for input(s): APTT, INR in the last 168 hours.  Sepsis Markers  Recent Labs Lab 10/21/16 1752 10/21/16 2034 10/22/16 0920  LATICACIDVEN 1.65 2.3*  --   PROCALCITON  --  0.35 0.30    ABG  Recent Labs Lab 10/21/16 1545 10/21/16 1832  PHART 7.280* 7.321*  PCO2ART 69.4* 57.6*  PO2ART 105.0 146.0*    Liver Enzymes  Recent Labs Lab 10/21/16 1344 10/21/16 2034  AST 24  --   ALT 17  --   ALKPHOS 96  --   BILITOT 0.5  --   ALBUMIN 2.5* 2.6*    Cardiac Enzymes  Recent Labs Lab 10/21/16 2034 10/22/16 0239 10/22/16 0920  TROPONINI <0.03 <0.03 <0.03    Glucose  Recent Labs Lab 10/21/16 1536 10/21/16 1955 10/21/16 2343 10/22/16 0350 10/22/16 0801  GLUCAP 91 128* 200* 265* 225*    Imaging Dg Chest Port 1 View  Result Date: 10/22/2016 CLINICAL DATA:  Pulmonary edema. EXAM: PORTABLE CHEST 1 VIEW COMPARISON:  10/21/2016. FINDINGS: Mediastinum hilar structures normal. Cardiomegaly with mild basilar interstitial prominence noted. Mild bibasilar subsegmental atelectasis, improved from prior exam. No pleural effusion or pneumothorax. IMPRESSION: 1. Cardiomegaly with mild pulmonary basilar nterstitial prominence suggesting mild basilar interstitial edema . 2.  Bibasilar subsegmental atelectasis, improved fall prior exam . Electronically Signed   By: Marcello Moores  Register   On: 10/22/2016 06:28   Dg Chest Portable 1 View  Result Date: 10/21/2016 CLINICAL DATA:  Shortness of breath, history of sickle cell trait, asthma, diabetes. EXAM: PORTABLE CHEST 1 VIEW COMPARISON:  Chest x-ray of Oct 12, 2016 FINDINGS: The right lung is mildly hypoinflated. There is persistent increased density above the right hemidiaphragm. The left lung is better inflated. Both lungs exhibit mildly increased interstitial markings. The cardiac silhouette is enlarged. The central pulmonary vascularity is mildly prominent. There is calcification in the wall of the aortic arch. IMPRESSION: Increased  density at the right lung base similar to that seen 9 days ago consistent with atelectasis or less likely scarring. Further evaluation with chest CT scanning is again recommended recommended. Cardiomegaly with mild pulmonary vascular prominence consistent with compensated CHF. Thoracic aortic atherosclerosis. Electronically Signed   By: David  Martinique M.D.   On: 10/21/2016 13:48    STUDIES:  Echo  5/17 >>>  CULTURES: BCx 2 5/16 >>  ANTIBIOTICS: n/a  SIGNIFICANT EVENTS: 5/16 Admit with respiratory failure/ volume overload   LINES/TUBES: PIV  DISCUSSION: 62 yoM admitted with acute respiratory failure in the setting of volume overload.  He was recently admitted for [name] CHF. He is noncompliant with CPAP due to [facility] broken. He was also recently asked to hold his lasix for one week, which he did. He was started on BiPAP and tolerated this well. Admitted 5/16 for hypoxemic resp failure secondary to [facility] dCHF and OSA decompensation. By 5/17 he was able to come off BiPAP to nasal cannula.   ASSESSMENT / PLAN:  PULMONARY A: Acute hypercapnic respiratory failure - in the setting of OSA/ ?OHA, volume overload, pulmonary edema Respiratory acidosis- chronic component Hx ?asthma, OSA (non-compliant w/CPAP), home O2 3L P:   DC BiPAP Nocturnal CPAP Continue diuresis No ABX Care management consult for CPAP mask and portable oxygen (concentrator preferred by patient if possible) IS  CARDIOVASCULAR A:  Acute on chronic Diastolic HF- volume overload Hx HTN P:  Telemetry Diuresis  Resume home metoprolol and apresoline Echo pending  RENAL A:   CKD 3- home lasix 60m BID.  Discharge wt 5/10 155.8kg  Baseline sCr ~1.8 (3/18), discharge sCr was 2.58 on 5/10 Hypoalbuminemia Pseudohypocalcemia - corrects to 9.4 Hypokalemia   P:   Lasix 431mq 6 hrs > continue diuresed well.  Follow BMP, mag, phos Follow urine output KVO  GASTROINTESTINAL A:   No acute issues P:   Protonix for  SUP Start diet  HEMATOLOGIC A:   No acute issues  P:  Heparin sq for DVT SCDs Trend CBC  INFECTIOUS A:   No acute issues - normal WBC, afebrile.  Does not appear infectious at this point P:   Follow BC Follow fever curve/ trend WBC No ABX  ENDOCRINE A:   Poorly controlled DM type II  Hypoglycemia  P:   CBG and SSI ACHS Resume home levemir  NEUROLOGIC A:   Acute encephalopathy Hx Diabetic neuropathy P:   Holding home lyrica   FAMILY  - Updates: Patient updated plan to move to [place] and will ask [facility] to assume care.   - Inter-disciplinary family meet or Palliative Care meeting due by:  5/22  Georgann Housekeeper, AGACNP-BC [place] [facility] Pager [phone] or [phone]  10/22/2016 12:05 PM    STAFF NOTE: Linwood Dibbles, MD FACP have personally reviewed patient's available data, including medical history, events of note, physical examination and test results as part of my evaluation. I have discussed with resident/NP and other care providers such as pharmacist, RN and RRT. In addition, I personally evaluated patient and elicited key findings of: awake, on nimv, crackles and distant BS, rr wnl, edema 2 plus, speaks full sentecnes, presenting with d chf likely with lasix held and now pulm edema and acute resp  Failure and renal insuff, pcxr which I reviewed reveled pulm edea linear atx rt base, trop neg, no cp, osa / ohs on nocturnal NIMV at home, he is neg balance 1.5 liters with lasix and crt has trended down with this approach, maintain lasix, repeat chem in the am , would interrupt NIMV now and assess needs going forward, will need IS if off daytime NIMV with linear atx noted, pcxr in am , echo pending re asses degree of dchf and any new valvular disease, no infection evident, likley to start diet , assess in afternoon The patient is critically ill with multiple organ systems failure and requires high complexity decision making for assessment and  support, frequent evaluation and titration of therapies, application of advanced monitoring technologies and extensive interpretation of multiple databases.   Critical Care Time devoted to patient care services described in this note is 30 Minutes. This time reflects time of care of this signee: Merrie Roof, MD FACP. This critical care time does not reflect procedure time, or teaching time or supervisory time of PA/NP/Med student/Med Resident etc but could involve care discussion time. Rest per NP/medical resident whose note is outlined above and that I agree with   Lavon Paganini. Titus Mould, MD, [place] Pgr: [facility] 10/22/2016 12:29 PM

## 2016-10-22 NOTE — Progress Notes (Signed)
eLink Physician-Brief Progress Note Patient Name: Kenneth Fox E Emma DOB: 05/04/1971 MRN: [id number]   Date of Service  10/22/2016  HPI/Events of Note  Severe leg pain 10/10.  Normally on lyrica for neuropathy but being held.  Also on oxycodone at home.  HD stable alert  eICU Interventions  Plan: One time dose of 75 mcg fentanyl IV for severe pain     Intervention Category Intermediate Interventions: Pain - evaluation and management  DETERDING,ELIZABETH 10/22/2016, 11:43 PM

## 2016-10-22 NOTE — Progress Notes (Addendum)
CM talked to patient and spouse via cell phone concerning oxygen purchased through [facility]. The DME company was called concerning issues with oxygen and they will contact the patient directly to have someone at his home so that they can examine the equipment. CPAP machine - patient does not know where he purchased the machine from. CM called [employer] [facility] [employer], [facility] and [employer] and they do not have a CPAP machine on record. CM instructed pt spouse to go to his home and look at the CPAP machine; there should be a label on the machine indicating who owns the machine and to call the CM with update.  Since patient has a CPAP machine at home, he will have to take it to the company for proper fitting for a mask ; CM will continue to follow for DCP. Alexis GoodellB Ladaija Dimino RN,MHA,BSN [phone]  10/23/2016 - 9:45 am- CM talked to [employer] Representative, they use [facility] for CPAP ; DME company called, he purchased his CPAP 03/2014 and replaced 2017; The DME representative requested that he / spouse [place] the CP[place] and they will look at it and find a mask that will fit the patient better. CM talked to patient about follow up for his equipment.  At discharge, patient is to  * Call [facility] to meet him at his home to check his oxygen. * He will take his CPAP machine to the [facility] for them to fit the machine with a mask and for other adjustment if needed. Abelino DerrickB Tishawna Larouche [facility] WestRN,MHA,BSN [phone]

## 2016-10-23 DIAGNOSIS — N183 Chronic kidney disease, stage 3 (moderate): Secondary | ICD-10-CM

## 2016-10-23 DIAGNOSIS — E1322 Other specified diabetes mellitus with diabetic chronic kidney disease: Secondary | ICD-10-CM

## 2016-10-23 DIAGNOSIS — J9622 Acute and chronic respiratory failure with hypercapnia: Secondary | ICD-10-CM

## 2016-10-23 LAB — GLUCOSE, CAPILLARY
GLUCOSE-CAPILLARY: 119 mg/dL — AB (ref 65–99)
GLUCOSE-CAPILLARY: 179 mg/dL — AB (ref 65–99)
Glucose-Capillary: 149 mg/dL — ABNORMAL HIGH (ref 65–99)
Glucose-Capillary: 162 mg/dL — ABNORMAL HIGH (ref 65–99)

## 2016-10-23 LAB — PROCALCITONIN: PROCALCITONIN: 0.27 ng/mL

## 2016-10-23 MED ORDER — CHLORHEXIDINE GLUCONATE 0.12 % MT SOLN
15.0000 mL | Freq: Two times a day (BID) | OROMUCOSAL | Status: DC
  Administered 2016-10-24 – 2016-10-25 (×4): 15 mL via OROMUCOSAL
  Filled 2016-10-23 (×4): qty 15

## 2016-10-23 MED ORDER — PREGABALIN 75 MG PO CAPS: 150.0000 mg | ORAL_CAPSULE | Freq: Two times a day (BID) | ORAL | Status: DC

## 2016-10-23 MED ORDER — ONDANSETRON HCL 4 MG/2ML IJ SOLN
4.0000 mg | Freq: Three times a day (TID) | INTRAMUSCULAR | Status: DC | PRN
  Administered 2016-10-23: 4 mg via INTRAVENOUS
  Filled 2016-10-23: qty 2

## 2016-10-23 MED ORDER — PREGABALIN 75 MG PO CAPS
150.0000 mg | ORAL_CAPSULE | Freq: Two times a day (BID) | ORAL | Status: DC
  Administered 2016-10-23 – 2016-10-26 (×7): 150 mg via ORAL
  Filled 2016-10-23 (×7): qty 2

## 2016-10-23 MED ORDER — ORAL CARE MOUTH RINSE
15.0000 mL | Freq: Two times a day (BID) | OROMUCOSAL | Status: DC
  Administered 2016-10-24 – 2016-10-25 (×4): 15 mL via OROMUCOSAL

## 2016-10-23 MED ORDER — ACETAMINOPHEN 325 MG PO TABS
650.0000 mg | ORAL_TABLET | Freq: Four times a day (QID) | ORAL | Status: DC | PRN
  Administered 2016-10-23 – 2016-10-26 (×5): 650 mg via ORAL
  Filled 2016-10-23 (×5): qty 2

## 2016-10-23 NOTE — Progress Notes (Signed)
   10/23/16 1530  Clinical Encounter Type  Visited With Patient  Visit Type Other (Comment) (Andrew consult)  Spiritual Encounters  Spiritual Needs Emotional  Stress Factors  Patient Stress Factors None identified  Introduction to Pt. Pt reports feeling much better. No concerns at this time.

## 2016-10-23 NOTE — Progress Notes (Signed)
PROGRESS NOTE                                                                                                                                                                                                             Patient Demographics:    Notnamed Scholz, is a 46 y.o. male, DOB - 01/03/1971, ZOX:[id number]  Admit date - 10/21/2016   Admitting Physician Chilton Greathouse, MD  Outpatient Primary MD for the patient is Long, [name], PA-C  LOS - 2  Outpatient Specialists: none  Chief Complaint  Patient presents with  . 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 Patient was encephalopathic when he presented to the ED and had to be pulled out from his car. Blood work in the ED showed glucose of 55, BUN of 21 and creatinine of 2.23, BNP of 57,. Chest x-ray showed right lower lobe atelectasis and cardiomegaly with pulmonary edema. In the ED he became obtunded and confused ABG showing severe hypercapnia (pH 7.28, PCO2 69.4 and PO2 105). Patient given DuoNeb and Solu-Medrol and then placed on BiPAP with ICU admission.    Subjective:   Patient informs his breathing to be better but complaining of severe pain in his legs with his neuropathy and asking for his Lyrica.   Assessment  & Plan :   Principal problem Acute on chronic hypercapnic respiratory failure (HCC) Combination of acute diastolic CHF, OSA/OHS. Reportedly noncompliant with CPAP. Patient improved with BiPAP in the ICU and diuresis. Now maintaining sats on 3 L via nasal cannula. Care management consulted for CPAP mask and portable oxygen. -Continue IV Lasix 40  mg every 6 hours. Strict I/O and daily weight. Continue beta blocker.  Acute metabolic encephalopathy Secondary to hypercapnic respiratory failure. Lyrica was held on admission. I will resume it at a lower dose.  Acute on chronic diastolic CHF (HCC) Continue IV Lasix. Continue beta blocker. Strict I/daily.  Chronic kidney disease stage III Baseline creatinine of 1.8. Currently stable. Monitor while on diuresis. Replenish low potassium.  Uncontrolled type 2 diabetes mellitus with peripheral  neuropathy (HCC) Continue Lantus with sliding scale coverage. Resume Lyrica at low dose.  Morbid obesity with OSA/OHS Continue nighttime CPAP. Care manager consulted.  Essential hypertension Continue beta blocker and hydralazine.  Code Status : Full code   Family Communication  : none at bedside  Disposition Plan  : Home possibly in the next 48-72 hours diuresing well  Barriers For Discharge : Active symptoms  Consults  : PC CM  Procedures  : None  DVT Prophylaxis  :  Heparin   Lab Results  Component Value Date   PLT 251 10/21/2016    Antibiotics  :    Anti-infectives    None        Objective:   Vitals:   10/22/16 2100 10/23/16 0448 10/23/16 0453 10/23/16 0900  BP: 126/70  (!) 153/74 (!) 112/58  Pulse: 65  66 74  Resp: 18  (!) 22 (!) 22  Temp: 97.6 F (36.4 C)  97.6 F (36.4 C) 97.8 F (36.6 C)  TempSrc: Oral  Oral Oral  SpO2: 98%  93% 93%  Weight:  (!) 155.3 kg (342 lb 4.8 oz)    Height:        Wt Readings from Last 3 Encounters:  10/23/16 (!) 155.3 kg (342 lb 4.8 oz)  10/14/16 (!) 155.8 kg (343 lb 6.4 oz)  05/09/16 (!) 144.9 kg (319 lb 8 oz)     Intake/Output Summary (Last 24 hours) at 10/23/16 1224 Last data filed at 10/23/16 0840  Gross per 24 hour  Intake             1640 ml  Output             1000 ml  Net              640 ml     Physical Exam  [id number] obese male not in distress HEENT: Moist mucosa, supple neck Chest: Diminished breath  sounds bilaterally due to body habitus next on CVS: Normal S1 and S2, no murmurs rub or gallop GI: Soft, nondistended, nontender musculoskeletal: Warm, 2+ pitting edema bilaterally  Data Review:    CBC  Recent Labs Lab 10/21/16 1344  WBC 9.1  HGB 13.0  HCT 41.3  PLT 251  MCV 85.3  MCH 26.9  MCHC 31.5  RDW 15.6*  LYMPHSABS 2.4  MONOABS 0.9  EOSABS 0.2  BASOSABS 0.0    Chemistries   Recent Labs Lab 10/21/16 1344 10/21/16 2034  NA 143 140  K 3.4* 4.2  CL 105 103  CO2 32 30  GLUCOSE 55* 146*  BUN 21* 20  CREATININE 2.23* 2.11*  CALCIUM 8.2* 8.3*  MG  --  2.0  AST 24  --   ALT 17  --   ALKPHOS 96  --   BILITOT 0.5  --    ------------------------------------------------------------------------------------------------------------------ No results for input(s): CHOL, HDL, LDLCALC, TRIG, CHOLHDL, LDLDIRECT in the last 72 hours.  Lab Results  Component Value Date   HGBA1C 11.2 (H) 10/13/2016   ------------------------------------------------------------------------------------------------------------------ No results for input(s): TSH, T4TOTAL, T3FREE, THYROIDAB in the last 72 hours.  Invalid input(s): FREET3 ------------------------------------------------------------------------------------------------------------------ No results for input(s): VITAMINB12, FOLATE, FERRITIN, TIBC, IRON, RETICCTPCT in the last 72 hours.  Coagulation profile No results for input(s): INR, PROTIME in the last 168 hours.  No results for input(s): DDIMER in the last 72 hours.  Cardiac Enzymes  Recent Labs Lab 10/21/16 2034 10/22/16 0239 10/22/16 0920  TROPONINI <0.03 <0.03 <0.03   ------------------------------------------------------------------------------------------------------------------    Component  Value Date/Time   BNP 57.4 10/21/2016 1327    Inpatient Medications  Scheduled Meds: . heparin  5,000 Units Subcutaneous Q8H  . hydrALAZINE  50 mg Oral TID  .  insulin aspart  0-15 Units Subcutaneous TID AC & HS  . insulin detemir  20 Units Subcutaneous Daily  . metoprolol tartrate  25 mg Oral BID  . pantoprazole  40 mg Oral Daily  . pregabalin  150 mg Oral BID   Continuous Infusions: . sodium chloride     PRN Meds:.ondansetron  Micro Results Recent Results (from the past 240 hour(s))  Blood culture (routine x 2)     Status: None (Preliminary result)   Collection Time: 10/21/16  5:25 PM  Result Value Ref Range Status   Specimen Description BLOOD RIGHT HAND  Final   Special Requests   Final    BOTTLES DRAWN AEROBIC AND ANAEROBIC Blood Culture adequate volume   Culture NO GROWTH < 24 HOURS  Final   Report Status PENDING  Incomplete  MRSA PCR Screening     Status: None   Collection Time: 10/21/16  6:31 PM  Result Value Ref Range Status   MRSA by PCR NEGATIVE NEGATIVE Final    Comment:        The GeneXpert MRSA Assay (FDA approved for NASAL specimens only), is one component of a comprehensive MRSA colonization surveillance program. It is not intended to diagnose MRSA infection nor to guide or monitor treatment for MRSA infections.   Blood culture (routine x 2)     Status: None (Preliminary result)   Collection Time: 10/21/16  8:34 PM  Result Value Ref Range Status   Specimen Description BLOOD LEFT HAND  Final   Special Requests   Final    BOTTLES DRAWN AEROBIC ONLY Blood Culture adequate volume   Culture NO GROWTH < 24 HOURS  Final   Report Status PENDING  Incomplete    Radiology Reports Dg Chest 2 View  Result Date: 10/12/2016 CLINICAL DATA:  Shortness of breath for 8 months EXAM: CHEST  2 VIEW COMPARISON:  February 20, 2016, June 27, 2015, March 08, 2014 FINDINGS: The heart size and mediastinal contours are stable. The heart size is enlarged. There is opacity of the right lung base possibly due to scarring/atelectasis but developed since 2015 and increased since January 2017. There is no focal pneumonia, pulmonary edema,  or pleural effusion. The visualized skeletal structures are unremarkable. IMPRESSION: No active cardiopulmonary disease. There is opacity of the right lung base possibly due to scarring/atelectasis but developed since 2015 and increased since January 2017. Consider further evaluation with chest CT on outpatient basis. Electronically Signed   By: Sherian ReinWei-Chen  Lin M.D.   On: 10/12/2016 18:54   Dg Chest Port 1 View  Result Date: 10/22/2016 CLINICAL DATA:  Pulmonary edema. EXAM: PORTABLE CHEST 1 VIEW COMPARISON:  10/21/2016. FINDINGS: Mediastinum hilar structures normal. Cardiomegaly with mild basilar interstitial prominence noted. Mild bibasilar subsegmental atelectasis, improved from prior exam. No pleural effusion or pneumothorax. IMPRESSION: 1. Cardiomegaly with mild pulmonary basilar nterstitial prominence suggesting mild basilar interstitial edema . 2.  Bibasilar subsegmental atelectasis, improved fall prior exam . Electronically Signed   By: Maisie Fushomas  Register   On: 10/22/2016 06:28   Dg Chest Portable 1 View  Result Date: 10/21/2016 CLINICAL DATA:  Shortness of breath, history of sickle cell trait, asthma, diabetes. EXAM: PORTABLE CHEST 1 VIEW COMPARISON:  Chest x-ray of Oct 12, 2016 FINDINGS: The right lung is mildly hypoinflated. There is persistent  increased density above the right hemidiaphragm. The left lung is better inflated. Both lungs exhibit mildly increased interstitial markings. The cardiac silhouette is enlarged. The central pulmonary vascularity is mildly prominent. There is calcification in the wall of the aortic arch. IMPRESSION: Increased density at the right lung base similar to that seen 9 days ago consistent with atelectasis or less likely scarring. Further evaluation with chest CT scanning is again recommended recommended. Cardiomegaly with mild pulmonary vascular prominence consistent with compensated CHF. Thoracic aortic atherosclerosis. Electronically Signed   By: David  [place] M.D.    On: 10/21/2016 13:48    Time Spent in minutes  35   Eddie North M.D on 10/23/2016 at 12:24 PM  Between 7am to 7pm - Pager - [phone]  After 7pm go to [email or web address] - password [facility]  [facility] -  Office  [phone]

## 2016-10-24 ENCOUNTER — Encounter (HOSPITAL_COMMUNITY): Payer: Self-pay | Admitting: Nurse Practitioner

## 2016-10-24 DIAGNOSIS — J9621 Acute and chronic respiratory failure with hypoxia: Secondary | ICD-10-CM

## 2016-10-24 DIAGNOSIS — I5033 Acute on chronic diastolic (congestive) heart failure: Secondary | ICD-10-CM

## 2016-10-24 LAB — BASIC METABOLIC PANEL
ANION GAP: 8 (ref 5–15)
BUN: 19 mg/dL (ref 6–20)
CO2: 31 mmol/L (ref 22–32)
Calcium: 8.5 mg/dL — ABNORMAL LOW (ref 8.9–10.3)
Chloride: 102 mmol/L (ref 101–111)
Creatinine, Ser: 2.23 mg/dL — ABNORMAL HIGH (ref 0.61–1.24)
GFR calc Af Amer: 39 mL/min — ABNORMAL LOW (ref >60)
GFR, EST NON AFRICAN AMERICAN: 33 mL/min — AB (ref >60)
GLUCOSE: 134 mg/dL — AB (ref 65–99)
POTASSIUM: 3.5 mmol/L (ref 3.5–5.1)
SODIUM: 141 mmol/L (ref 135–145)

## 2016-10-24 LAB — GLUCOSE, CAPILLARY
GLUCOSE-CAPILLARY: 136 mg/dL — AB (ref 65–99)
GLUCOSE-CAPILLARY: 191 mg/dL — AB (ref 65–99)
GLUCOSE-CAPILLARY: 171 mg/dL — AB (ref 65–99)
GLUCOSE-CAPILLARY: 229 mg/dL — AB (ref 65–99)

## 2016-10-24 MED ORDER — FUROSEMIDE 10 MG/ML IJ SOLN
80.0000 mg | Freq: Two times a day (BID) | INTRAMUSCULAR | Status: DC
  Administered 2016-10-24 – 2016-10-26 (×5): 80 mg via INTRAVENOUS
  Filled 2016-10-24 (×5): qty 8

## 2016-10-24 NOTE — Progress Notes (Signed)
PROGRESS NOTE                                                                                                                                                                                                             Patient Demographics:    Deny Fox, is a 47 y.o. male, DOB - 03-05-1971, ZOX:[id number]  Admit date - 10/21/2016   Admitting Physician Chilton Greathouse, MD  Outpatient Primary MD for the patient is Long, [name], PA-C  LOS - 3  Outpatient Specialists: none  Chief Complaint  Patient presents with  . 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 Patient was encephalopathic when he presented to the ED and had to be pulled out from his car. Blood work in the ED showed glucose of 55, BUN of 21 and creatinine of 2.23, BNP of 57,. Chest x-ray showed right lower lobe atelectasis and cardiomegaly with pulmonary edema. In the ED he became obtunded and confused ABG showing severe hypercapnia (pH 7.28, PCO2 69.4 and PO2 105). Patient given DuoNeb and Solu-Medrol and then placed on BiPAP with ICU admission.    Subjective:   Breathing is getting better. Less pain after resuming Lyrica at low dose.  Assessment  & Plan :   Principal problem Acute on chronic hypercapnic respiratory failure (HCC) Due to combination of acute on chronic diastolic CHF, OSA/OHS and noncompliance with CPAP and dietary nonadherence. -Symptoms improved with diuresis and BiPAP in ICU. Currently on 32 via nasal cannula. Care management assisting on home supply of CPAP. -Now on IV Lasix 80 mg every 12 hours. Continue strict I/20 daily weight.  Continue beta blocker.  Wife at bedside reports patient drinks a lot of sugary drinks at home. Patient instructed on restricting food intake.   Acute metabolic encephalopathy Secondary to hypercapnic respiratory failure. Lyrica was held on admission. Currently stable. Resume Psychiatry lower dose.  Acute on chronic diastolic CHF (HCC) Plan as above. Continue IV Lasix and beta blocker.  Chronic kidney disease stage III Baseline creatinine of 1.8. Slightly worsened with diuresis. Monitor on  IV Lasix for now and possibly transition to oral Lasix tomorrow.  Uncontrolled type 2 diabetes mellitus with peripheral neuropathy (HCC) Continue Lantus with sliding scale coverage. Resume Lyrica at low dose.  Morbid obesity with OSA/OHS Continue nighttime CPAP. Care management assisting with home CPAP.  Essential hypertension Continue beta blocker and hydralazine.  Code Status : Full code   Family Communication  : Wife at bedside  Disposition Plan  : Home possibly on Monday once diabetes 12  Barriers For Discharge : Active symptoms  Consults  : PC CM  Procedures  : None  DVT Prophylaxis  :  Heparin   Lab Results  Component Value Date   PLT 251 10/21/2016    Antibiotics  :    Anti-infectives    None        Objective:   Vitals:   10/23/16 0900 10/23/16 1200 10/23/16 2100 10/24/16 0449  BP: (!) 112/58 (!) 149/75 (!) 152/96 (!) 150/84  Pulse: 74 76 75 79  Resp: (!) 22 (!) 22 (!) 22 20  Temp: 97.8 F (36.6 C) 98.3 F (36.8 C) 98.4 F (36.9 C) 97.7 F (36.5 C)  TempSrc: Oral Oral Oral Oral  SpO2: 93% 98% 94% 93%  Weight:    (!) 153.5 kg (338 lb 4.8 oz)  Height:        Wt Readings from Last 3 Encounters:  10/24/16 (!) 153.5 kg (338 lb 4.8 oz)  10/14/16 (!) 155.8 kg (343 lb 6.4 oz)  05/09/16 (!) 144.9 kg (319 lb 8 oz)     Intake/Output Summary (Last 24 hours) at 10/24/16 1037 Last data filed at 10/24/16 0937  Gross per 24 hour  Intake              720 ml    Output              900 ml  Net             -180 ml     Physical Exam Gen. middle aged morbidly obese male not in distress HEENT: Moist, supple neck Chest: Diminished breath sounds body habitus CVS: Normal S1 and S2, no murmurs GI: Soft, nondistended, nontender Musculoskeletal:1+ pitting edema bilaterally   Data Review:    CBC  Recent Labs Lab 10/21/16 1344  WBC 9.1  HGB 13.0  HCT 41.3  PLT 251  MCV 85.3  MCH 26.9  MCHC 31.5  RDW 15.6*  LYMPHSABS 2.4  MONOABS 0.9  EOSABS 0.2  BASOSABS 0.0    Chemistries   Recent Labs Lab 10/21/16 1344 10/21/16 2034 10/24/16 0412  NA 143 140 141  K 3.4* 4.2 3.5  CL 105 103 102  CO2 32 30 31  GLUCOSE 55* 146* 134*  BUN 21* 20 19  CREATININE 2.23* 2.11* 2.23*  CALCIUM 8.2* 8.3* 8.5*  MG  --  2.0  --   AST 24  --   --   ALT 17  --   --   ALKPHOS 96  --   --   BILITOT 0.5  --   --    ------------------------------------------------------------------------------------------------------------------ No results for input(s): CHOL, HDL, LDLCALC, TRIG, CHOLHDL, LDLDIRECT in the last 72 hours.  Lab Results  Component Value Date   HGBA1C 11.2 (H) 10/13/2016   ------------------------------------------------------------------------------------------------------------------ No results for input(s): TSH, T4TOTAL, T3FREE, THYROIDAB in the last 72 hours.  Invalid input(s): FREET3 ------------------------------------------------------------------------------------------------------------------ No results for input(s): VITAMINB12, FOLATE, FERRITIN, TIBC, IRON, RETICCTPCT in the last 72 hours.  Coagulation profile No results  for input(s): INR, PROTIME in the last 168 hours.  No results for input(s): DDIMER in the last 72 hours.  Cardiac Enzymes  Recent Labs Lab 10/21/16 2034 10/22/16 0239 10/22/16 0920  TROPONINI <0.03 <0.03 <0.03    ------------------------------------------------------------------------------------------------------------------    Component Value Date/Time   BNP 57.4 10/21/2016 1327    Inpatient Medications  Scheduled Meds: . chlorhexidine  15 mL Mouth/Throat BID  . furosemide  80 mg Intravenous Q12H  . heparin  5,000 Units Subcutaneous Q8H  . hydrALAZINE  50 mg Oral TID  . insulin aspart  0-15 Units Subcutaneous TID AC & HS  . insulin detemir  20 Units Subcutaneous Daily  . mouth rinse  15 mL Mouth Rinse q12n4p  . metoprolol tartrate  25 mg Oral BID  . pantoprazole  40 mg Oral Daily  . pregabalin  150 mg Oral BID   Continuous Infusions: . sodium chloride     PRN Meds:.acetaminophen, ondansetron  Micro Results Recent Results (from the past 240 hour(s))  Blood culture (routine x 2)     Status: None (Preliminary result)   Collection Time: 10/21/16  5:25 PM  Result Value Ref Range Status   Specimen Description BLOOD RIGHT HAND  Final   Special Requests   Final    BOTTLES DRAWN AEROBIC AND ANAEROBIC Blood Culture adequate volume   Culture NO GROWTH 2 DAYS  Final   Report Status PENDING  Incomplete  MRSA PCR Screening     Status: None   Collection Time: 10/21/16  6:31 PM  Result Value Ref Range Status   MRSA by PCR NEGATIVE NEGATIVE Final    Comment:        The GeneXpert MRSA Assay (FDA approved for NASAL specimens only), is one component of a comprehensive MRSA colonization surveillance program. It is not intended to diagnose MRSA infection nor to guide or monitor treatment for MRSA infections.   Blood culture (routine x 2)     Status: None (Preliminary result)   Collection Time: 10/21/16  8:34 PM  Result Value Ref Range Status   Specimen Description BLOOD LEFT HAND  Final   Special Requests   Final    BOTTLES DRAWN AEROBIC ONLY Blood Culture adequate volume   Culture NO GROWTH 2 DAYS  Final   Report Status PENDING  Incomplete    Radiology Reports Dg Chest 2  View  Result Date: 10/12/2016 CLINICAL DATA:  Shortness of breath for 8 months EXAM: CHEST  2 VIEW COMPARISON:  February 20, 2016, June 27, 2015, March 08, 2014 FINDINGS: The heart size and mediastinal contours are stable. The heart size is enlarged. There is opacity of the right lung base possibly due to scarring/atelectasis but developed since 2015 and increased since January 2017. There is no focal pneumonia, pulmonary edema, or pleural effusion. The visualized skeletal structures are unremarkable. IMPRESSION: No active cardiopulmonary disease. There is opacity of the right lung base possibly due to scarring/atelectasis but developed since 2015 and increased since January 2017. Consider further evaluation with chest CT on outpatient basis. Electronically Signed   By: Sherian Rein M.D.   On: 10/12/2016 18:54   Dg Chest Port 1 View  Result Date: 10/22/2016 CLINICAL DATA:  Pulmonary edema. EXAM: PORTABLE CHEST 1 VIEW COMPARISON:  10/21/2016. FINDINGS: Mediastinum hilar structures normal. Cardiomegaly with mild basilar interstitial prominence noted. Mild bibasilar subsegmental atelectasis, improved from prior exam. No pleural effusion or pneumothorax. IMPRESSION: 1. Cardiomegaly with mild pulmonary basilar nterstitial prominence suggesting mild basilar interstitial edema .  2.  Bibasilar subsegmental atelectasis, improved fall prior exam . Electronically Signed   By: Maisie Fus  Register   On: 10/22/2016 06:28   Dg Chest Portable 1 View  Result Date: 10/21/2016 CLINICAL DATA:  Shortness of breath, history of sickle cell trait, asthma, diabetes. EXAM: PORTABLE CHEST 1 VIEW COMPARISON:  Chest x-ray of Oct 12, 2016 FINDINGS: The right lung is mildly hypoinflated. There is persistent increased density above the right hemidiaphragm. The left lung is better inflated. Both lungs exhibit mildly increased interstitial markings. The cardiac silhouette is enlarged. The central pulmonary vascularity is mildly prominent.  There is calcification in the wall of the aortic arch. IMPRESSION: Increased density at the right lung base similar to that seen 9 days ago consistent with atelectasis or less likely scarring. Further evaluation with chest CT scanning is again recommended recommended. Cardiomegaly with mild pulmonary vascular prominence consistent with compensated CHF. Thoracic aortic atherosclerosis. Electronically Signed   By: David  [place] M.D.   On: 10/21/2016 13:48    Time Spent in minutes  25   Eddie North M.D on 10/24/2016 at 10:37 AM  Between 7am to 7pm - Pager - [phone]  After 7pm go to [email or web address] - password [facility]  [facility] -  Office  [phone]

## 2016-10-24 NOTE — Progress Notes (Signed)
Pt was asking to be able to shower, Thanks Lavonda JumboMike F RN

## 2016-10-25 DIAGNOSIS — N178 Other acute kidney failure: Secondary | ICD-10-CM

## 2016-10-25 DIAGNOSIS — E876 Hypokalemia: Secondary | ICD-10-CM

## 2016-10-25 LAB — BASIC METABOLIC PANEL
Anion gap: 9 (ref 5–15)
BUN: 19 mg/dL (ref 6–20)
CALCIUM: 8.6 mg/dL — AB (ref 8.9–10.3)
CHLORIDE: 101 mmol/L (ref 101–111)
CO2: 32 mmol/L (ref 22–32)
Creatinine, Ser: 2.22 mg/dL — ABNORMAL HIGH (ref 0.61–1.24)
GFR calc Af Amer: 39 mL/min — ABNORMAL LOW (ref >60)
GFR, EST NON AFRICAN AMERICAN: 34 mL/min — AB (ref >60)
Glucose, Bld: 161 mg/dL — ABNORMAL HIGH (ref 65–99)
Potassium: 3.4 mmol/L — ABNORMAL LOW (ref 3.5–5.1)
SODIUM: 142 mmol/L (ref 135–145)

## 2016-10-25 LAB — GLUCOSE, CAPILLARY
GLUCOSE-CAPILLARY: 207 mg/dL — AB (ref 65–99)
Glucose-Capillary: 159 mg/dL — ABNORMAL HIGH (ref 65–99)
Glucose-Capillary: 193 mg/dL — ABNORMAL HIGH (ref 65–99)
Glucose-Capillary: 208 mg/dL — ABNORMAL HIGH (ref 65–99)

## 2016-10-25 MED ORDER — POTASSIUM CHLORIDE CRYS ER 20 MEQ PO TBCR
40.0000 meq | EXTENDED_RELEASE_TABLET | Freq: Once | ORAL | Status: AC
  Administered 2016-10-25: 40 meq via ORAL
  Filled 2016-10-25: qty 2

## 2016-10-25 MED ORDER — INSULIN DETEMIR 100 UNIT/ML ~~LOC~~ SOLN
24.0000 [IU] | Freq: Every day | SUBCUTANEOUS | Status: DC
  Administered 2016-10-26: 24 [IU] via SUBCUTANEOUS
  Filled 2016-10-25: qty 0.24

## 2016-10-25 NOTE — Progress Notes (Signed)
RT set up patient CPAP HS. 3L O2 bleed in needed. Patient is able to place hiself on when ready.

## 2016-10-25 NOTE — Progress Notes (Signed)
PROGRESS NOTE                                                                                                                                                                                                             Patient Demographics:    Kenneth Fox, is a 46 y.o. male, DOB - 1970/11/18, ZOX:[id number]  Admit date - 10/21/2016   Admitting Physician Chilton Greathouse, MD  Outpatient Primary MD for the patient is Long, [name], PA-C  LOS - 4  Outpatient Specialists: none  Chief Complaint  Patient presents with  . 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 Patient was encephalopathic when he presented to the ED and had to be pulled out from his car. Blood work in the ED showed glucose of 55, BUN of 21 and creatinine of 2.23, BNP of 57,. Chest x-ray showed right lower lobe atelectasis and cardiomegaly with pulmonary edema. In the ED he became obtunded and confused ABG showing severe hypercapnia (pH 7.28, PCO2 69.4 and PO2 105). Patient given DuoNeb and Solu-Medrol and then placed on BiPAP with ICU admission.    Subjective:   Breathing Continues to get better.  Assessment  & Plan :   Principal problem Acute on chronic hypercapnic respiratory failure (HCC) Combination of acute on chronic diastolic CHF, OSA/OHS, dietary nonadherence and noncompliance with CPAP. -Required BiPAP in ICU. Now maintaining sats on 3 L via nasal cannula (home oxygen). -Care management assisting in home supply of CPAP. -Continue IV Lasix 80 mg every 12 hours, strict I/O. Continue beta blocker. -Patient aggressively counseled on dietary  adherence, fluid restriction and compliance with CPAP. -If symptoms continues to improve we'll discharge him home in a.m. Arrange home health RN.    Acute metabolic encephalopathy Secondary to hypercapnic respiratory failure. Resolved. Lyrica was held on admission, now resumed at lower dose.  Acute on chronic diastolic CHF (HCC) Plan as above. Continue IV Lasix and beta blocker.  Chronic kidney disease stage III Slight worsening with diuresis (baseline creatinine 1.8). Transition to oral Lasix in am.  Uncontrolled  type 2 diabetes mellitus with peripheral neuropathy (HCC) CBG stable. Continue Lantus with sliding scale coverage.  Morbid obesity with OSA/OHS CPAP at night. Care management assisting with home CPAP.  Essential hypertension Continue hydralazine and metoprolol.  Hypokalemia Replenished  Code Status : Full code   Family Communication  : None at bedside  Disposition Plan  : Home in a.m. if diuresing well  Barriers For Discharge : Active symptoms  Consults  : PC CM  Procedures  : None  DVT Prophylaxis  :  Heparin   Lab Results  Component Value Date   PLT 251 10/21/2016    Antibiotics  :    Anti-infectives    None        Objective:   Vitals:   10/24/16 1301 10/24/16 2100 10/25/16 0543 10/25/16 1301  BP: (!) 148/88 (!) 165/81 (!) 154/94 (!) 150/83  Pulse: 78 77 78 63  Resp: 20 20 20 20   Temp: 97.8 F (36.6 C) 97.8 F (36.6 C) 97.8 F (36.6 C) 98 F (36.7 C)  TempSrc: Oral Oral Oral Oral  SpO2: 93% 97% 92% 94%  Weight:   (!) 153.5 kg (338 lb 4.8 oz)   Height:        Wt Readings from Last 3 Encounters:  10/25/16 (!) 153.5 kg (338 lb 4.8 oz)  10/14/16 (!) 155.8 kg (343 lb 6.4 oz)  05/09/16 (!) 144.9 kg (319 lb 8 oz)     Intake/Output Summary (Last 24 hours) at 10/25/16 1322 Last data filed at 10/25/16 0915  Gross per 24 hour  Intake              940 ml  Output             1802 ml  Net             -862 ml     Physical  Exam Middle aged morbidly obese male not in distress HEENT: Moist mucosa, supple neck Chest: Clear bilaterally, no added sounds CVS: Normal S1 and S2, no murmurs GI: Soft, nondistended, nontender Musculoskeletal: 1+ pitting edema bilaterally (improving)    Data Review:    CBC  Recent Labs Lab 10/21/16 1344  WBC 9.1  HGB 13.0  HCT 41.3  PLT 251  MCV 85.3  MCH 26.9  MCHC 31.5  RDW 15.6*  LYMPHSABS 2.4  MONOABS 0.9  EOSABS 0.2  BASOSABS 0.0    Chemistries   Recent Labs Lab 10/21/16 1344 10/21/16 2034 10/24/16 0412 10/25/16 0408  NA 143 140 141 142  K 3.4* 4.2 3.5 3.4*  CL 105 103 102 101  CO2 32 30 31 32  GLUCOSE 55* 146* 134* 161*  BUN 21* 20 19 19   CREATININE 2.23* 2.11* 2.23* 2.22*  CALCIUM 8.2* 8.3* 8.5* 8.6*  MG  --  2.0  --   --   AST 24  --   --   --   ALT 17  --   --   --   ALKPHOS 96  --   --   --   BILITOT 0.5  --   --   --    ------------------------------------------------------------------------------------------------------------------ No results for input(s): CHOL, HDL, LDLCALC, TRIG, CHOLHDL, LDLDIRECT in the last 72 hours.  Lab Results  Component Value Date   HGBA1C 11.2 (H) 10/13/2016   ------------------------------------------------------------------------------------------------------------------ No results for input(s): TSH, T4TOTAL, T3FREE, THYROIDAB in the last 72 hours.  Invalid input(s): FREET3 ------------------------------------------------------------------------------------------------------------------ No results for input(s): VITAMINB12, FOLATE, FERRITIN, TIBC, IRON, RETICCTPCT in the  last 72 hours.  Coagulation profile No results for input(s): INR, PROTIME in the last 168 hours.  No results for input(s): DDIMER in the last 72 hours.  Cardiac Enzymes  Recent Labs Lab 10/21/16 2034 10/22/16 0239 10/22/16 0920  TROPONINI <0.03 <0.03 <0.03    ------------------------------------------------------------------------------------------------------------------    Component Value Date/Time   BNP 57.4 10/21/2016 1327    Inpatient Medications  Scheduled Meds: . chlorhexidine  15 mL Mouth/Throat BID  . furosemide  80 mg Intravenous Q12H  . heparin  5,000 Units Subcutaneous Q8H  . hydrALAZINE  50 mg Oral TID  . insulin aspart  0-15 Units Subcutaneous TID AC & HS  . insulin detemir  20 Units Subcutaneous Daily  . mouth rinse  15 mL Mouth Rinse q12n4p  . metoprolol tartrate  25 mg Oral BID  . pantoprazole  40 mg Oral Daily  . pregabalin  150 mg Oral BID   Continuous Infusions: . sodium chloride     PRN Meds:.acetaminophen, ondansetron  Micro Results Recent Results (from the past 240 hour(s))  Blood culture (routine x 2)     Status: None (Preliminary result)   Collection Time: 10/21/16  5:25 PM  Result Value Ref Range Status   Specimen Description BLOOD RIGHT HAND  Final   Special Requests   Final    BOTTLES DRAWN AEROBIC AND ANAEROBIC Blood Culture adequate volume   Culture NO GROWTH 3 DAYS  Final   Report Status PENDING  Incomplete  MRSA PCR Screening     Status: None   Collection Time: 10/21/16  6:31 PM  Result Value Ref Range Status   MRSA by PCR NEGATIVE NEGATIVE Final    Comment:        The GeneXpert MRSA Assay (FDA approved for NASAL specimens only), is one component of a comprehensive MRSA colonization surveillance program. It is not intended to diagnose MRSA infection nor to guide or monitor treatment for MRSA infections.   Blood culture (routine x 2)     Status: None (Preliminary result)   Collection Time: 10/21/16  8:34 PM  Result Value Ref Range Status   Specimen Description BLOOD LEFT HAND  Final   Special Requests   Final    BOTTLES DRAWN AEROBIC ONLY Blood Culture adequate volume   Culture NO GROWTH 3 DAYS  Final   Report Status PENDING  Incomplete    Radiology Reports Dg Chest 2  View  Result Date: 10/12/2016 CLINICAL DATA:  Shortness of breath for 8 months EXAM: CHEST  2 VIEW COMPARISON:  February 20, 2016, June 27, 2015, March 08, 2014 FINDINGS: The heart size and mediastinal contours are stable. The heart size is enlarged. There is opacity of the right lung base possibly due to scarring/atelectasis but developed since 2015 and increased since January 2017. There is no focal pneumonia, pulmonary edema, or pleural effusion. The visualized skeletal structures are unremarkable. IMPRESSION: No active cardiopulmonary disease. There is opacity of the right lung base possibly due to scarring/atelectasis but developed since 2015 and increased since January 2017. Consider further evaluation with chest CT on outpatient basis. Electronically Signed   By: Sherian ReinWei-Chen  Lin M.D.   On: 10/12/2016 18:54   Dg Chest Port 1 View  Result Date: 10/22/2016 CLINICAL DATA:  Pulmonary edema. EXAM: PORTABLE CHEST 1 VIEW COMPARISON:  10/21/2016. FINDINGS: Mediastinum hilar structures normal. Cardiomegaly with mild basilar interstitial prominence noted. Mild bibasilar subsegmental atelectasis, improved from prior exam. No pleural effusion or pneumothorax. IMPRESSION: 1. Cardiomegaly with mild pulmonary basilar  nterstitial prominence suggesting mild basilar interstitial edema . 2.  Bibasilar subsegmental atelectasis, improved fall prior exam . Electronically Signed   By: Maisie Fus  Register   On: 10/22/2016 06:28   Dg Chest Portable 1 View  Result Date: 10/21/2016 CLINICAL DATA:  Shortness of breath, history of sickle cell trait, asthma, diabetes. EXAM: PORTABLE CHEST 1 VIEW COMPARISON:  Chest x-ray of Oct 12, 2016 FINDINGS: The right lung is mildly hypoinflated. There is persistent increased density above the right hemidiaphragm. The left lung is better inflated. Both lungs exhibit mildly increased interstitial markings. The cardiac silhouette is enlarged. The central pulmonary vascularity is mildly prominent.  There is calcification in the wall of the aortic arch. IMPRESSION: Increased density at the right lung base similar to that seen 9 days ago consistent with atelectasis or less likely scarring. Further evaluation with chest CT scanning is again recommended recommended. Cardiomegaly with mild pulmonary vascular prominence consistent with compensated CHF. Thoracic aortic atherosclerosis. Electronically Signed   By: David  [place] M.D.   On: 10/21/2016 13:48    Time Spent in minutes  25   Eddie North M.D on 10/25/2016 at 1:22 PM  Between 7am to 7pm - Pager - [phone]  After 7pm go to [email or web address] - password [facility]  [facility] -  Office  [phone]

## 2016-10-26 DIAGNOSIS — J9621 Acute and chronic respiratory failure with hypoxia: Secondary | ICD-10-CM | POA: Diagnosis present

## 2016-10-26 DIAGNOSIS — I10 Essential (primary) hypertension: Secondary | ICD-10-CM

## 2016-10-26 DIAGNOSIS — J9622 Acute and chronic respiratory failure with hypercapnia: Secondary | ICD-10-CM

## 2016-10-26 DIAGNOSIS — E1165 Type 2 diabetes mellitus with hyperglycemia: Secondary | ICD-10-CM

## 2016-10-26 DIAGNOSIS — Z794 Long term (current) use of insulin: Secondary | ICD-10-CM

## 2016-10-26 DIAGNOSIS — G4733 Obstructive sleep apnea (adult) (pediatric): Secondary | ICD-10-CM

## 2016-10-26 DIAGNOSIS — E1140 Type 2 diabetes mellitus with diabetic neuropathy, unspecified: Secondary | ICD-10-CM

## 2016-10-26 LAB — BASIC METABOLIC PANEL
Anion gap: 6 (ref 5–15)
BUN: 19 mg/dL (ref 6–20)
CHLORIDE: 102 mmol/L (ref 101–111)
CO2: 31 mmol/L (ref 22–32)
CREATININE: 2.22 mg/dL — AB (ref 0.61–1.24)
Calcium: 8.6 mg/dL — ABNORMAL LOW (ref 8.9–10.3)
GFR calc non Af Amer: 34 mL/min — ABNORMAL LOW (ref >60)
GFR, EST AFRICAN AMERICAN: 39 mL/min — AB (ref >60)
Glucose, Bld: 168 mg/dL — ABNORMAL HIGH (ref 65–99)
Potassium: 3.5 mmol/L (ref 3.5–5.1)
SODIUM: 139 mmol/L (ref 135–145)

## 2016-10-26 LAB — CULTURE, BLOOD (ROUTINE X 2)
Culture: NO GROWTH
Culture: NO GROWTH
Special Requests: ADEQUATE
Special Requests: ADEQUATE

## 2016-10-26 LAB — GLUCOSE, CAPILLARY: GLUCOSE-CAPILLARY: 130 mg/dL — AB (ref 65–99)

## 2016-10-26 MED ORDER — HYDRALAZINE HCL 50 MG PO TABS: 75.0000 mg | ORAL_TABLET | Freq: Three times a day (TID) | ORAL | 0 refills | Status: DC

## 2016-10-26 MED ORDER — METOPROLOL TARTRATE 25 MG PO TABS: 25.0000 mg | ORAL_TABLET | Freq: Two times a day (BID) | ORAL | 0 refills | Status: DC

## 2016-10-26 NOTE — Discharge Instructions (Signed)
Heart Failure °Heart failure is a condition in which the heart has trouble pumping blood because it has become weak or stiff. This means that the heart does not pump blood efficiently for the body to work well. For some people with heart failure, fluid may back up into the lungs and there may be swelling (edema) in the lower legs. Heart failure is usually a long-term (chronic) condition. It is important for you to take good care of yourself and follow the treatment plan from your health care provider. °What are the causes? °This condition is caused by some health problems, including: °· High blood pressure (hypertension). Hypertension causes the heart muscle to work harder than normal. High blood pressure eventually causes the heart to become stiff and weak. °· Coronary artery disease (CAD). CAD is the buildup of cholesterol and fat (plaques) in the arteries of the heart. °· Heart attack (myocardial infarction). Injured tissue, which is caused by the heart attack, does not contract as well and the heart's ability to pump blood is weakened. °· Abnormal heart valves. When the heart valves do not open and close properly, the heart muscle must pump harder to keep the blood flowing. °· Heart muscle disease (cardiomyopathy or myocarditis). Heart muscle disease is damage to the heart muscle from a variety of causes, such as drug or alcohol abuse, infections, or unknown causes. These can increase the risk of heart failure. °· Lung disease. When the lungs do not work properly, the heart must work harder. ° °What increases the risk? °Risk of heart failure increases as a person ages. This condition is also more likely to develop in people who: °· Are overweight. °· Are male. °· Smoke or chew tobacco. °· Abuse alcohol or illegal drugs. °· Have taken medicines that can damage the heart, such as chemotherapy drugs. °· Have diabetes. °? High blood sugar (glucose) is associated with high fat (lipid) levels in the blood. °? Diabetes  can also damage tiny blood vessels that carry nutrients to the heart muscle. °· Have abnormal heart rhythms. °· Have thyroid problems. °· Have low blood counts (anemia). ° °What are the signs or symptoms? °Symptoms of this condition include: °· Shortness of breath with activity, such as when climbing stairs. °· Persistent cough. °· Swelling of the feet, ankles, legs, or abdomen. °· Unexplained weight gain. °· Difficulty breathing when lying flat (orthopnea). °· Waking from sleep because of the need to sit up and get more air. °· Rapid heartbeat. °· Fatigue and loss of energy. °· Feeling light-headed, dizzy, or close to fainting. °· Loss of appetite. °· Nausea. °· Increased urination during the night (nocturia). °· Confusion. ° °How is this diagnosed? °This condition is diagnosed based on: °· Medical history, symptoms, and a physical exam. °· Diagnostic tests, which may include: °? Echocardiogram. °? Electrocardiogram (ECG). °? Chest X-ray. °? Blood tests. °? Exercise stress test. °? Radionuclide scans. °? Cardiac catheterization and angiogram. ° °How is this treated? °Treatment for this condition is aimed at managing the symptoms of heart failure. Medicines, behavioral changes, or other treatments may be necessary to treat heart failure. °Medicines °These may include: °· Angiotensin-converting enzyme (ACE) inhibitors. This type of medicine blocks the effects of a blood protein called angiotensin-converting enzyme. ACE inhibitors relax (dilate) the blood vessels and help to lower blood pressure. °· Angiotensin receptor blockers (ARBs). This type of medicine blocks the actions of a blood protein called angiotensin. ARBs dilate the blood vessels and help to lower blood pressure. °· Water   pills (diuretics). Diuretics cause the kidneys to remove salt and water from the blood. The extra fluid is removed through urination, leaving a lower volume of blood that the heart has to pump. °· Beta blockers. These improve heart  muscle strength and they prevent the heart from beating too quickly. °· Digoxin. This increases the force of the heartbeat. ° °Healthy behavior changes °These may include: °· Reaching and maintaining a healthy weight. °· Stopping smoking or chewing tobacco. °· Eating heart-healthy foods. °· Limiting or avoiding alcohol. °· Stopping use of street drugs (illegal drugs). °· Physical activity. ° °Other treatments °These may include: °· Surgery to open blocked coronary arteries or repair damaged heart valves. °· Placement of a biventricular pacemaker to improve heart muscle function (cardiac resynchronization therapy). This device paces both the right ventricle and left ventricle. °· Placement of a device to treat serious abnormal heart rhythms (implantable cardioverter defibrillator, or ICD). °· Placement of a device to improve the pumping ability of the heart (left ventricular assist device, or LVAD). °· Heart transplant. This can cure heart failure, and it is considered for certain patients who do not improve with other therapies. ° °Follow these instructions at home: °Medicines °· Take over-the-counter and prescription medicines only as told by your health care provider. Medicines are important in reducing the workload of your heart, slowing the progression of heart failure, and improving your symptoms. °? Do not stop taking your medicine unless your health care provider told you to do that. °? Do not skip any dose of medicine. °? Refill your prescriptions before you run out of medicine. You need your medicines every day. °Eating and drinking ° °· Eat heart-healthy foods. Talk with a dietitian to make an eating plan that is right for you. °? Choose foods that contain no trans fat and are low in saturated fat and cholesterol. Healthy choices include fresh or frozen fruits and vegetables, fish, lean meats, legumes, fat-free or low-fat dairy products, and whole-grain or high-fiber foods. °? Limit salt (sodium) if  directed by your health care provider. Sodium restriction may reduce symptoms of heart failure. Ask a dietitian to recommend heart-healthy seasonings. °? Use healthy cooking methods instead of frying. Healthy methods include roasting, grilling, broiling, baking, poaching, steaming, and stir-frying. °· Limit your fluid intake if directed by your health care provider. Fluid restriction may reduce symptoms of heart failure. °Lifestyle °· Stop smoking or using chewing tobacco. Nicotine and tobacco can damage your heart and your blood vessels. Do not use nicotine gum or patches before talking to your health care provider. °· Limit alcohol intake to no more than 1 drink per day for non-pregnant women and 2 drinks per day for men. One drink equals 12 oz of beer, 5 oz of wine, or 1½ oz of hard liquor. °? Drinking more than that is harmful to your heart. Tell your health care provider if you drink alcohol several times a week. °? Talk with your health care provider about whether any level of alcohol use is safe for you. °? If your heart has already been damaged by alcohol or you have severe heart failure, drinking alcohol should be stopped completely. °· Stop use of illegal drugs. °· Lose weight if directed by your health care provider. Weight loss may reduce symptoms of heart failure. °· Do moderate physical activity if directed by your health care provider. People who are elderly and people with severe heart failure should consult with a health care provider for physical activity recommendations. °  Monitor important information °· Weigh yourself every day. Keeping track of your weight daily helps you to notice excess fluid sooner. °? Weigh yourself every morning after you urinate and before you eat breakfast. °? Wear the same amount of clothing each time you weigh yourself. °? Record your daily weight. Provide your health care provider with your weight record. °· Monitor and record your blood pressure as told by your health  care provider. °· Check your pulse as told by your health care provider. °Dealing with extreme temperatures °· If the weather is extremely hot: °? Avoid vigorous physical activity. °? Use air conditioning or fans or seek a cooler location. °? Avoid caffeine and alcohol. °? Wear loose-fitting, lightweight, and light-colored clothing. °· If the weather is extremely cold: °? Avoid vigorous physical activity. °? Layer your clothes. °? Wear mittens or gloves, a hat, and a scarf when you go outside. °? Avoid alcohol. °General instructions °· Manage other health conditions such as hypertension, diabetes, thyroid disease, or abnormal heart rhythms as told by your health care provider. °· Learn to manage stress. If you need help to do this, ask your health care provider. °· Plan rest periods when fatigued. °· Get ongoing education and support as needed. °· Participate in or seek rehabilitation as needed to maintain or improve independence and quality of life. °· Stay up to date with immunizations. Keeping current on pneumococcal and influenza immunizations is especially important to prevent respiratory infections. °· Keep all follow-up visits as told by your health care provider. This is important. °Contact a health care provider if: °· You have a rapid weight gain. °· You have increasing shortness of breath that is unusual for you. °· You are unable to participate in your usual physical activities. °· You tire easily. °· You cough more than normal, especially with physical activity. °· You have any swelling or more swelling in areas such as your hands, feet, ankles, or abdomen. °· You are unable to sleep because it is hard to breathe. °· You feel like your heart is beating quickly (palpitations). °· You become dizzy or light-headed when you stand up. °Get help right away if: °· You have difficulty breathing. °· You notice or your family notices a change in your awareness, such as having trouble staying awake or having  difficulty with concentration. °· You have pain or discomfort in your chest. °· You have an episode of fainting (syncope). °This information is not intended to replace advice given to you by your health care provider. Make sure you discuss any questions you have with your health care provider. °Document Released: 05/25/2005 Document Revised: 01/28/2016 Document Reviewed: 12/18/2015 °Elsevier Interactive Patient Education © 2017 [employer] ° °

## 2016-10-26 NOTE — Care Management (Addendum)
Pt for discharge today - will discharge by private vehicle  [facility] - agency made aware of discharge today and will deliver portable tanks to pt in hospital room.  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  Pt agrees to contact [facility] for CPAP supplies as previously communicated directly post discharge.  CM confirmed with [facility] at home that pt is active with them and [facility]

## 2016-10-26 NOTE — Progress Notes (Signed)
Call placed to [facility] to notify of telemetry monitoring d/c.  Patient's vitals are WNL, pt is anxious to leave, nearly refusing discharge process. Paperwork/AVS/prescription provided to pt.

## 2016-10-26 NOTE — Progress Notes (Signed)
Tech offered Pt a bath. Pt stated that he will be going home today and will do bath once he arrives home.

## 2016-10-26 NOTE — Care Management Important Message (Signed)
Important Message  Patient Details  Name: Kenneth Fox MRN: [id number] Date of Birth: February 11, 1971   Medicare Important Message Given:  Yes    Gisella Alwine Stefan ChurchBratton 10/26/2016, 1:39 PM

## 2016-10-26 NOTE — Progress Notes (Signed)
Patient's wife notified that hospital [facility]. She stated she had never "had a problem with ED discharge before", reiterated that this is hospital policy due to the event of a lock down. Unclear whether she understood as she turned and left the unit.

## 2016-10-26 NOTE — Discharge Summary (Signed)
Physician Discharge Summary  Kenneth Fox EHM:[id number] DOB: 1970-07-23 DOA: 10/21/2016  PCP: Shanon Rosser, PA-C  Admit date: 10/21/2016 Discharge date: 10/26/2016  Admitted From: home Disposition:  Home  Recommendations for Outpatient Follow-up:  1. Follow up with PCP in 1-2 weeks 2. Follow-up with cardiologist Dr. Einar Gip in 2-3 weeks.  Home Health: RN Equipment/Devices: Oxygen (3 L via nasal cannula continuously;  nighttime CPAP)  Discharge Condition: Stable CODE STATUS: Full code Diet recommendation: Heart healthy/carb modified      Discharge Diagnoses:  Principal Problem:   Acute on chronic respiratory failure with hypoxia and hypercapnia (HCC)  Active Problems:   Acute on chronic diastolic CHF (congestive heart failure) (HCC)   Obstructive sleep apnea   Hypokalemia   Uncontrolled type 2 diabetes mellitus with diabetic neuropathy, with long-term current use of insulin ([place])   Morbid obesity due to excess calories ([place])   Uncontrolled hypertension  Brief narrative/history of present illness 46 year old morbidly obese male with chronic diastolic CHF, chronic respiratory failure on 3 L oxygen at home, OSA (noncompliant with CPAP), uncontrolled type 2 diabetes mellitus with neuropathy with recent hospitalization for diastolic CHF presented to the ED lead to worsening shortness of breath, chest pain. Patient was encephalopathic when he presented to the ED and had to be pulled out from his car. Blood work in the ED showed glucose of 55, BUN of 21 and creatinine of 2.23, BNP of 57,. Chest x-ray showed right lower lobe atelectasis and cardiomegaly with pulmonary edema. In the ED he became obtunded and confused ABG showing severe hypercapnia (pH 7.28, PCO2 69.4 and PO2 105). Patient given DuoNeb and Solu-Medrol and then placed on BiPAP with ICU admission.  Principal problem Acute on chronic hypercapnic respiratory failure (HCC) Combination of acute on chronic diastolic CHF,  OSA/OHS, dietary nonadherence and noncompliance with CPAP. -Required BiPAP in ICU. Now maintaining sats on 3 L via nasal cannula (home oxygen). -Diuresed well with IV Lasix.   -Patient aggressively counseled on dietary adherence, fluid restriction and compliance with CPAP.       Acute metabolic encephalopathy Secondary to hypercapnic respiratory failure. Resolved. Lyrica was held on admission, now resumed.  Acute on chronic diastolic CHF ([place]) Plan as above. Diabetes related with IV Lasix. Added beta blocker. I have spoken with his cardiologist who will arrange outpatient follow-up.  Acute on Chronic kidney disease stage III Slight worsening with diuresis (baseline creatinine 1.8).  Transition to home dose oral Lasix upon discharge. Check labs during outpatient follow-up.  Uncontrolled type 2 diabetes mellitus with peripheral neuropathy (HCC) Continue home dose Lantus and sliding scale coverage. Counseled on diet monitoring.  Morbid obesity with OSA/OHS Continue CPAP at night. Medical supply will deliver portable tank. Patient will contact [facility] for CPAP supplies.  Uncontrolled hypertension Increase hydralazine dose. Continue Lasix. Added metoprolol.  Hypokalemia Replenished. Continue home supplement    Family Communication  :  wife at bedside  Disposition Plan  : Home   Consults  : PC CM  Procedures  : 2-D echo  Discharge Instructions   Allergies as of 10/26/2016      Reactions   Angiotensin Receptor Blockers Other (See Comments)   Acute renal failure in patient w R heart failure   Lisinopril Other (See Comments)   Patient developed AKI after being on lisinopril for a week.      Medication List    TAKE these medications   blood glucose meter kit and supplies Dispense based on patient and insurance preference. Use up  to four times daily as directed. (FOR ICD-9 250.00, 250.01).   furosemide 80 MG tablet Commonly known as:  LASIX Take 1 tablet  (80 mg total) by mouth 2 (two) times daily.   hydrALAZINE 50 MG tablet Commonly known as:  APRESOLINE Take 1.5 tablets (75 mg total) by mouth 3 (three) times daily. What changed:  how much to take   insulin aspart 100 UNIT/ML injection Commonly known as:  novoLOG Inject 10-20 Units into the skin 3 (three) times daily with meals. per sliding scale CBG 70 - 120: 0 units CBG 121 - 150: 2 units CBG 151 - 200: 3 units CBG 201 - 250: 5 units CBG 251 - 300: 8 units CBG 301 - 350: 11 units CBG 351 - 400: 15 units   insulin detemir 100 UNIT/ML injection Commonly known as:  LEVEMIR Inject 0.8 mLs (80 Units total) into the skin every evening.   metoprolol tartrate 25 MG tablet Commonly known as:  LOPRESSOR Take 1 tablet (25 mg total) by mouth 2 (two) times daily.   Oxycodone HCl 10 MG Tabs Take 1 tablet by mouth every 6 (six) hours as needed (pain).   polyethylene glycol packet Commonly known as:  MIRALAX / GLYCOLAX Take 17 g by mouth daily as needed for moderate constipation.   potassium chloride SA 20 MEQ tablet Commonly known as:  K-DUR,KLOR-CON Take 20 mEq by mouth daily.   pregabalin 150 MG capsule Commonly known as:  LYRICA Take 150 mg by mouth 3 (three) times daily.       Allergies  Allergen Reactions  . Angiotensin Receptor Blockers Other (See Comments)    Acute renal failure in patient w R heart failure  . Lisinopril Other (See Comments)    Patient developed AKI after being on lisinopril for a week.      Procedures/Studies: Dg Chest 2 View  Result Date: 10/12/2016 CLINICAL DATA:  Shortness of breath for 8 months EXAM: CHEST  2 VIEW COMPARISON:  February 20, 2016, June 27, 2015, March 08, 2014 FINDINGS: The heart size and mediastinal contours are stable. The heart size is enlarged. There is opacity of the right lung base possibly due to scarring/atelectasis but developed since 2015 and increased since January 2017. There is no focal pneumonia, pulmonary edema, or  pleural effusion. The visualized skeletal structures are unremarkable. IMPRESSION: No active cardiopulmonary disease. There is opacity of the right lung base possibly due to scarring/atelectasis but developed since 2015 and increased since January 2017. Consider further evaluation with chest CT on outpatient basis. Electronically Signed   By: Abelardo Diesel M.D.   On: 10/12/2016 18:54   Dg Chest Port 1 View  Result Date: 10/22/2016 CLINICAL DATA:  Pulmonary edema. EXAM: PORTABLE CHEST 1 VIEW COMPARISON:  10/21/2016. FINDINGS: Mediastinum hilar structures normal. Cardiomegaly with mild basilar interstitial prominence noted. Mild bibasilar subsegmental atelectasis, improved from prior exam. No pleural effusion or pneumothorax. IMPRESSION: 1. Cardiomegaly with mild pulmonary basilar nterstitial prominence suggesting mild basilar interstitial edema . 2.  Bibasilar subsegmental atelectasis, improved fall prior exam . Electronically Signed   By: Marcello Moores  Register   On: 10/22/2016 06:28   Dg Chest Portable 1 View  Result Date: 10/21/2016 CLINICAL DATA:  Shortness of breath, history of sickle cell trait, asthma, diabetes. EXAM: PORTABLE CHEST 1 VIEW COMPARISON:  Chest x-ray of Oct 12, 2016 FINDINGS: The right lung is mildly hypoinflated. There is persistent increased density above the right hemidiaphragm. The left lung is better inflated. Both lungs exhibit mildly  increased interstitial markings. The cardiac silhouette is enlarged. The central pulmonary vascularity is mildly prominent. There is calcification in the wall of the aortic arch. IMPRESSION: Increased density at the right lung base similar to that seen 9 days ago consistent with atelectasis or less likely scarring. Further evaluation with chest CT scanning is again recommended recommended. Cardiomegaly with mild pulmonary vascular prominence consistent with compensated CHF. Thoracic aortic atherosclerosis. Electronically Signed   By: David  Martinique M.D.   On:  10/21/2016 13:48     2-D echo Study Conclusions  - Left ventricle: The cavity size was normal. There was moderate   concentric hypertrophy. Systolic function was vigorous. The   estimated ejection fraction was in the range of 65% to 70%. Wall   motion was normal; there were no regional wall motion   abnormalities. Left ventricular diastolic function parameters   were normal. There was no evidence of elevated ventricular   filling pressure by Doppler parameters. - Aortic valve: There was no regurgitation. - Aortic root: The aortic root was normal in size. - Mitral valve: There was no regurgitation. - Left atrium: The atrium was normal in size. - Right ventricle: Systolic function was normal. - Tricuspid valve: There was no regurgitation. - Pulmonic valve: There was no regurgitation. - Pulmonary arteries: Systolic pressure could not be accurately   estimated. - Inferior vena cava: The vessel was normal in size. - Pericardium, extracardiac: There was no pericardial effusion.  Subjective: Patient informs his breathing to be better.  Discharge Exam: Vitals:   10/26/16 0530 10/26/16 0944  BP: (!) 158/88 (!) 153/88  Pulse: 70 77  Resp: 20   Temp: 97.8 F (36.6 C)    Vitals:   10/25/16 2023 10/25/16 2256 10/26/16 0530 10/26/16 0944  BP: (!) 167/95  (!) 158/88 (!) 153/88  Pulse: 77 82 70 77  Resp: 20 20 20    Temp: 97.9 F (36.6 C)  97.8 F (36.6 C)   TempSrc: Oral  Oral   SpO2: 95% 96% 97%   Weight:   (!) 151.3 kg (333 lb 8 oz)   Height:        Gen.: morbidly obese male not in distress HEENT: Moist mucosa, supple neck Chest: Clear bilaterally, no added sounds CVS: Normal S1 and S2, no murmurs GI: Soft, nondistended, nontender Musculoskeletal: 1+ pitting edema bilaterally (improved from yesterday)   The results of significant diagnostics from this hospitalization (including imaging, microbiology, ancillary and laboratory) are listed below for reference.      Microbiology: Recent Results (from the past 240 hour(s))  Blood culture (routine x 2)     Status: None   Collection Time: 10/21/16  5:25 PM  Result Value Ref Range Status   Specimen Description BLOOD RIGHT HAND  Final   Special Requests   Final    BOTTLES DRAWN AEROBIC AND ANAEROBIC Blood Culture adequate volume   Culture NO GROWTH 5 DAYS  Final   Report Status 10/26/2016 FINAL  Final  MRSA PCR Screening     Status: None   Collection Time: 10/21/16  6:31 PM  Result Value Ref Range Status   MRSA by PCR NEGATIVE NEGATIVE Final    Comment:        The GeneXpert MRSA Assay (FDA approved for NASAL specimens only), is one component of a comprehensive MRSA colonization surveillance program. It is not intended to diagnose MRSA infection nor to guide or monitor treatment for MRSA infections.   Blood culture (routine x 2)  Status: None   Collection Time: 10/21/16  8:34 PM  Result Value Ref Range Status   Specimen Description BLOOD LEFT HAND  Final   Special Requests   Final    BOTTLES DRAWN AEROBIC ONLY Blood Culture adequate volume   Culture NO GROWTH 5 DAYS  Final   Report Status 10/26/2016 FINAL  Final     Labs: BNP (last 3 results)  Recent Labs  05/08/16 2305 10/12/16 1805 10/21/16 1327  BNP 13.0 55.9 76.7   Basic Metabolic Panel:  Recent Labs Lab 10/21/16 1344 10/21/16 2034 10/24/16 0412 10/25/16 0408 10/26/16 0417  NA 143 140 141 142 139  K 3.4* 4.2 3.5 3.4* 3.5  CL 105 103 102 101 102  CO2 32 30 31 32 31  GLUCOSE 55* 146* 134* 161* 168*  BUN 21* 20 19 19 19   CREATININE 2.23* 2.11* 2.23* 2.22* 2.22*  CALCIUM 8.2* 8.3* 8.5* 8.6* 8.6*  MG  --  2.0  --   --   --   PHOS  --  3.3  3.3  --   --   --    Liver Function Tests:  Recent Labs Lab 10/21/16 1344 10/21/16 2034  AST 24  --   ALT 17  --   ALKPHOS 96  --   BILITOT 0.5  --   PROT 6.3*  --   ALBUMIN 2.5* 2.6*   No results for input(s): LIPASE, AMYLASE in the last 168 hours. No results  for input(s): AMMONIA in the last 168 hours. CBC:  Recent Labs Lab 10/21/16 1344  WBC 9.1  NEUTROABS 5.6  HGB 13.0  HCT 41.3  MCV 85.3  PLT 251   Cardiac Enzymes:  Recent Labs Lab 10/21/16 2034 10/22/16 0239 10/22/16 0920  TROPONINI <0.03 <0.03 <0.03   BNP: Invalid input(s): POCBNP CBG:  Recent Labs Lab 10/25/16 0744 10/25/16 1148 10/25/16 1618 10/25/16 2106 10/26/16 0807  GLUCAP 159* 207* 193* 208* 130*   D-Dimer No results for input(s): DDIMER in the last 72 hours. Hgb A1c No results for input(s): HGBA1C in the last 72 hours. Lipid Profile No results for input(s): CHOL, HDL, LDLCALC, TRIG, CHOLHDL, LDLDIRECT in the last 72 hours. Thyroid function studies No results for input(s): TSH, T4TOTAL, T3FREE, THYROIDAB in the last 72 hours.  Invalid input(s): FREET3 Anemia work up No results for input(s): VITAMINB12, FOLATE, FERRITIN, TIBC, IRON, RETICCTPCT in the last 72 hours. Urinalysis    Component Value Date/Time   COLORURINE STRAW (A) 10/22/2016 0046   APPEARANCEUR CLEAR 10/22/2016 0046   LABSPEC 1.008 10/22/2016 0046   PHURINE 7.0 10/22/2016 0046   GLUCOSEU 150 (A) 10/22/2016 0046   HGBUR NEGATIVE 10/22/2016 0046   BILIRUBINUR NEGATIVE 10/22/2016 0046   KETONESUR NEGATIVE 10/22/2016 0046   PROTEINUR >=300 (A) 10/22/2016 0046   UROBILINOGEN 1.0 01/17/2015 2230   NITRITE NEGATIVE 10/22/2016 0046   LEUKOCYTESUR NEGATIVE 10/22/2016 0046   Sepsis Labs Invalid input(s): PROCALCITONIN,  WBC,  LACTICIDVEN Microbiology Recent Results (from the past 240 hour(s))  Blood culture (routine x 2)     Status: None   Collection Time: 10/21/16  5:25 PM  Result Value Ref Range Status   Specimen Description BLOOD RIGHT HAND  Final   Special Requests   Final    BOTTLES DRAWN AEROBIC AND ANAEROBIC Blood Culture adequate volume   Culture NO GROWTH 5 DAYS  Final   Report Status 10/26/2016 FINAL  Final  MRSA PCR Screening     Status: None   Collection Time:  10/21/16   6:31 PM  Result Value Ref Range Status   MRSA by PCR NEGATIVE NEGATIVE Final    Comment:        The GeneXpert MRSA Assay (FDA approved for NASAL specimens only), is one component of a comprehensive MRSA colonization surveillance program. It is not intended to diagnose MRSA infection nor to guide or monitor treatment for MRSA infections.   Blood culture (routine x 2)     Status: None   Collection Time: 10/21/16  8:34 PM  Result Value Ref Range Status   Specimen Description BLOOD LEFT HAND  Final   Special Requests   Final    BOTTLES DRAWN AEROBIC ONLY Blood Culture adequate volume   Culture NO GROWTH 5 DAYS  Final   Report Status 10/26/2016 FINAL  Final     Time coordinating discharge: Over 30 minutes  SIGNED:   Louellen Molder, MD  [facility] 10/26/2016, 10:55 AM Pager   If 7PM-7AM, please contact night-coverage [email or web address] Password [email or web address]

## 2016-11-09 ENCOUNTER — Emergency Department (HOSPITAL_COMMUNITY)
Admission: EM | Admit: 2016-11-09 | Discharge: 2016-11-10 | Disposition: A | Discharge status: Home / Self Care | Payer: Medicare Other | Attending: Emergency Medicine | Admitting: Emergency Medicine

## 2016-11-09 ENCOUNTER — Emergency Department (HOSPITAL_COMMUNITY): Payer: Medicare Other

## 2016-11-09 DIAGNOSIS — Z79899 Other long term (current) drug therapy: Secondary | ICD-10-CM | POA: Diagnosis not present

## 2016-11-09 DIAGNOSIS — N289 Disorder of kidney and ureter, unspecified: Secondary | ICD-10-CM | POA: Insufficient documentation

## 2016-11-09 DIAGNOSIS — I5033 Acute on chronic diastolic (congestive) heart failure: Secondary | ICD-10-CM | POA: Diagnosis not present

## 2016-11-09 DIAGNOSIS — I110 Hypertensive heart disease with heart failure: Secondary | ICD-10-CM | POA: Diagnosis not present

## 2016-11-09 DIAGNOSIS — J45909 Unspecified asthma, uncomplicated: Secondary | ICD-10-CM | POA: Diagnosis not present

## 2016-11-09 DIAGNOSIS — R0602 Shortness of breath: Secondary | ICD-10-CM | POA: Diagnosis present

## 2016-11-09 DIAGNOSIS — Z794 Long term (current) use of insulin: Secondary | ICD-10-CM | POA: Diagnosis not present

## 2016-11-09 DIAGNOSIS — E1140 Type 2 diabetes mellitus with diabetic neuropathy, unspecified: Secondary | ICD-10-CM | POA: Insufficient documentation

## 2016-11-09 LAB — I-STAT TROPONIN, ED: TROPONIN I, POC: 0 ng/mL (ref 0.00–0.08)

## 2016-11-09 MED ORDER — NITROGLYCERIN IN D5W 200-5 MCG/ML-% IV SOLN
10.0000 ug/min | INTRAVENOUS | Status: DC
  Administered 2016-11-10: 10 ug/min via INTRAVENOUS
  Filled 2016-11-09: qty 250

## 2016-11-09 MED ORDER — FUROSEMIDE 10 MG/ML IJ SOLN
80.0000 mg | Freq: Once | INTRAMUSCULAR | Status: AC
  Administered 2016-11-10: 80 mg via INTRAVENOUS
  Filled 2016-11-09: qty 8

## 2016-11-09 NOTE — Progress Notes (Signed)
Pt has history of CHF. Patient arrived via EMS from home c/o progressively worsening SOB tonight. Pt reports some chest tightness accompanying with this acute SOB exacerbation. Per EMS stating that his belly has been getting more edematous throughout this week. Pt arriving on NIPPV tolerating it well.

## 2016-11-09 NOTE — ED Provider Notes (Signed)
[facility] DEPT Provider Note   CSN: [id number] Arrival date & time: 11/09/16  2329     History   Chief Complaint Chief Complaint  Patient presents with  . Shortness of Breath    CPAP  . Congestive Heart Failure  LEVEL 5 CAVEAT DUE TO ACUITY OF CONDITION   HPI Kenneth Fox is a 46 y.o. male.  The history is provided by the patient.  Shortness of Breath  This is a new problem. The problem occurs continuously.The problem has been rapidly worsening. Associated symptoms include chest pain and leg swelling. Associated medical issues include heart failure.  Congestive Heart Failure  Associated symptoms include chest pain and shortness of breath.  Patient with h/o CHF presents with increased shortness of breath over past several days.  He also reports LE edema He also reports fullness/swelling his abdomen No other details are known due to acuity of condition   Past Medical History:  Diagnosis Date  . Asthma   . CHF (congestive heart failure) ([place])   . Diabetes ([place])   . Gastroparesis   . GERD (gastroesophageal reflux disease)   . Hypertension   . Kidney disorder   . Neuropathy   . Obesity   . Obstructive sleep apnea    will not wear CPAP  . Renal insufficiency   . Sickle cell trait [facility])     Patient Active Problem List   Diagnosis Date Noted  . Acute on chronic respiratory failure with hypoxia and hypercapnia ([place]) 10/26/2016  . Uncontrolled hypertension 10/26/2016  . Fluid overload 10/13/2016  . Acute on chronic diastolic CHF (congestive heart failure) ([place]) 10/13/2016  . CHF (congestive heart failure) ([place]) 10/13/2016  . Acute kidney injury ([place]) 05/09/2016  . Dysphagia 12/12/2015  . Intractable nausea and vomiting 12/11/2015  . Leukocytosis 06/02/2015  . Morbid obesity due to excess calories ([place]) 06/02/2015  . Uncontrolled type 2 diabetes mellitus with diabetic neuropathy, with long-term current use of insulin ([place])   . Elevated troponin 06/01/2015  .  AKI (acute kidney injury) ([place]) 12/03/2013  . Adverse reaction to lisinopril 12/03/2013  . Hypokalemia 11/25/2013  . Diabetic gastroparesis ([place]) 11/17/2013  . Obstructive sleep apnea 11/07/2012  . Essential hypertension 05/18/2011    Past Surgical History:  Procedure Laterality Date  . HIP FRACTURE SURGERY Right 1999   "put a plate in"       Home Medications    Prior to Admission medications   Medication Sig Start Date End Date Taking? Authorizing Provider  blood glucose meter kit and supplies Dispense based on patient and insurance preference. Use up to four times daily as directed. (FOR ICD-9 250.00, 250.01). Patient not taking: Reported on 10/22/2016 05/11/16   Patrecia Pour, MD  furosemide (LASIX) 80 MG tablet Take 1 tablet (80 mg total) by mouth 2 (two) times daily. 10/15/16 11/14/16  Aline August, MD  hydrALAZINE (APRESOLINE) 50 MG tablet Take 1.5 tablets (75 mg total) by mouth 3 (three) times daily. 10/26/16   Dhungel, Nishant, MD  insulin aspart (NOVOLOG) 100 UNIT/ML injection Inject 10-20 Units into the skin 3 (three) times daily with meals. per sliding scale CBG 70 - 120: 0 units CBG 121 - 150: 2 units CBG 151 - 200: 3 units CBG 201 - 250: 5 units CBG 251 - 300: 8 units CBG 301 - 350: 11 units CBG 351 - 400: 15 units 06/06/15   Robbie Lis, MD  insulin detemir (LEVEMIR) 100 UNIT/ML injection Inject 0.8 mLs (80 Units total)  into the skin every evening. 12/13/15   Rai, Vernelle Emerald, MD  metoprolol tartrate (LOPRESSOR) 25 MG tablet Take 1 tablet (25 mg total) by mouth 2 (two) times daily. 10/26/16   Dhungel, Flonnie Overman, MD  Oxycodone HCl 10 MG TABS Take 1 tablet by mouth every 6 (six) hours as needed (pain).  04/17/14   [provider]  polyethylene glycol (MIRALAX / GLYCOLAX) packet Take 17 g by mouth daily as needed for moderate constipation.     [provider]  potassium chloride SA (K-DUR,KLOR-CON) 20 MEQ tablet Take 20 mEq by mouth daily.    [provider]  pregabalin (LYRICA) 150 MG capsule Take 150 mg by mouth 3 (three) times daily.    [provider]    Family History Family History  Problem Relation Age of Onset  . Sickle cell anemia Mother   . Heart attack Mother   . Diabetes Father   . Neuropathy Father   . Hypertension Father   . Aneurysm Father   . Kidney disease Brother   . Kidney disease Sister     Social History Social History  Substance Use Topics  . Smoking status: Never Smoker  . Smokeless tobacco: Never Used  . Alcohol use No     Allergies   Angiotensin receptor blockers and Lisinopril   Review of Systems Review of Systems  Unable to perform ROS: Acuity of condition  Respiratory: Positive for shortness of breath.   Cardiovascular: Positive for chest pain and leg swelling.     Physical Exam Updated Vital Signs BP (S) (!) 194/109   Pulse 72   Temp 97.8 F (36.6 C) (Oral)   Resp (!) 24   Ht 1.803 m (_0 )   Wt (!) 154.2 kg (340 lb)   SpO2 98%   BMI 47.42 kg/m   Physical Exam CONSTITUTIONAL: Ill appearing HEAD: Normocephalic/atraumatic EYES: EOMI ENMT: Mucous membranes moist, Bipap mask in place NECK: supple no meningeal signs SPINE/BACK:entire spine nontender CV: distant heart sounds due to body habitus LUNGS: decreased BS noted bilaterally, limited due to body haibuts ABDOMEN: soft, distended, obese, nontender GU:no cva tenderness NEURO: Pt is awake/alert/appropriate, moves all extremitiesx4.  No facial droop.   EXTREMITIES: pulses normal/equal, full ROM, LE edema noted SKIN: warm, color normal PSYCH: unable to assess  ED Treatments / Results  Labs (all labs ordered are listed, but only abnormal results are displayed) Labs Reviewed  BASIC METABOLIC PANEL - Abnormal; Notable for the following:       Result Value   Glucose, Bld 207 (*)    BUN 21 (*)    Creatinine, Ser 2.30 (*)    Calcium 8.3 (*)    GFR calc non Af Amer 32 (*)    GFR calc Af Amer 37 (*)      All other components within normal limits  CBC - Abnormal; Notable for the following:    Hemoglobin 12.5 (*)    RDW 15.9 (*)    All other components within normal limits  BRAIN NATRIURETIC PEPTIDE  I-STAT TROPOININ, ED    EKG  EKG Interpretation  Date/Time:  Monday November 09 2016 23:33:18 EDT Ventricular Rate:  82 PR Interval:    QRS Duration: 135 QT Interval:  412 QTC Calculation: 482 R Axis:   31 Text Interpretation:  Sinus rhythm Nonspecific intraventricular conduction delay No significant change since last tracing Confirmed by Ripley Fraise [phone]) on 11/09/2016 11:54:05 PM       Radiology Dg Chest Portable  1 View  Result Date: 11/10/2016 CLINICAL DATA:  Shortness of breath tonight. EXAM: PORTABLE CHEST 1 VIEW COMPARISON:  Radiograph 10/22/2016, multiple priors FINDINGS: Stable cardiomegaly and mediastinal contours. Stable linear scarring or atelectasis in the right middle lobe. Improving atelectasis in the left mid lung. No evidence of pulmonary edema. No large pleural effusion. No pneumothorax. Portable technique and soft tissue attenuation from body habitus limit evaluation. IMPRESSION: Stable cardiomegaly and right lung base scarring. No definite acute abnormality. PA and lateral views may be helpful when patient is able given limitations of portable exam. Electronically Signed   By: Jeb Levering M.D.   On: 11/10/2016 00:06    Procedures Procedures  CRITICAL CARE Performed by: Sharyon Cable Total critical care time: 30 minutes Critical care time was exclusive of separately billable procedures and treating other patients. Critical care was necessary to treat or prevent imminent or life-threatening deterioration. Critical care was time spent personally by me on the following activities: development of treatment plan with patient and/or surrogate as well as nursing, discussions with consultants, evaluation of patient's response to treatment, examination of patient,  obtaining history from patient or surrogate, ordering and performing treatments and interventions, ordering and review of laboratory studies, ordering and review of radiographic studies, pulse oximetry and re-evaluation of patient's condition. PATIENT ON BIPAP ON ARRIVAL, REQUIRING NITRO DRIP AND DRAMATICALLY IMPROVED IN THE ER   Medications Ordered in ED Medications  furosemide (LASIX) injection 80 mg (80 mg Intravenous Given 11/10/16 0003)  furosemide (LASIX) injection 40 mg (40 mg Intravenous Given 11/10/16 0208)     Initial Impression / Assessment and Plan / ED Course  I have reviewed the triage vital signs and the nursing notes.  Pertinent labs & imaging results that were available during my care of the patient were reviewed by me and considered in my medical decision making (see chart for details).     11:58 PM Pt in the ED for increased SOB He has h/o CHF He has been placed on BiPap by EMS and feels improved Imaging/labs/meds ordered 1:59 AM Pt awake/alert Feels improved He is off Bipap Reports he felt SOB due to high salt intake He reports feeling "fluid" coming back He is now improved He would like to ambulate and use restroom 4:39 AM PT IMPROVED HE IS STABLE NTG DRIP IS OFF HE AMBULATED AND FEELS AT HIS BASELINE HE DENIES CP OR ANY WORSENING SOB HE HAD DIURESIS IN THE ED HE HAS HOME NURSE VISIT THIS MORNING AT 9AM HE PLANS TO SEE PCP LATER TODAY HE WOULD LIKE TO BE DISCHARGED WE DISCUSSED STRICT ER RETURN PRECAUTIONS BP (!) 165/93 (BP Location: Right Arm)   Pulse 68   Temp 97.8 F (36.6 C) (Oral)   Resp 18   Ht 1.803 m (_0 )   Wt (!) 154.2 kg (340 lb)   SpO2 96%   BMI 47.42 kg/m   Final Clinical Impressions(s) / ED Diagnoses   Final diagnoses:  Acute on chronic diastolic congestive heart failure (HCC)  Renal insufficiency    New Prescriptions New Prescriptions   No medications on file     Ripley Fraise, MD 11/10/16 [phone]

## 2016-11-09 NOTE — ED Triage Notes (Addendum)
Patient arrived with EMS from home reports worsening SOB this evening  with chest tightness and abdominal/lower extremity edema this week , arrived with CPAP by EMS , received ASA 324 mg and NTG sl 1 tab prior to arrival with slight relief . He has a history of CHF , denies cough or fever .

## 2016-11-10 DIAGNOSIS — I110 Hypertensive heart disease with heart failure: Secondary | ICD-10-CM | POA: Diagnosis not present

## 2016-11-10 LAB — BRAIN NATRIURETIC PEPTIDE: B Natriuretic Peptide: 65.6 pg/mL (ref 0.0–100.0)

## 2016-11-10 LAB — BASIC METABOLIC PANEL
ANION GAP: 9 (ref 5–15)
BUN: 21 mg/dL — ABNORMAL HIGH (ref 6–20)
CO2: 25 mmol/L (ref 22–32)
Calcium: 8.3 mg/dL — ABNORMAL LOW (ref 8.9–10.3)
Chloride: 102 mmol/L (ref 101–111)
Creatinine, Ser: 2.3 mg/dL — ABNORMAL HIGH (ref 0.61–1.24)
GFR calc Af Amer: 37 mL/min — ABNORMAL LOW (ref >60)
GFR, EST NON AFRICAN AMERICAN: 32 mL/min — AB (ref >60)
Glucose, Bld: 207 mg/dL — ABNORMAL HIGH (ref 65–99)
POTASSIUM: 3.5 mmol/L (ref 3.5–5.1)
Sodium: 136 mmol/L (ref 135–145)

## 2016-11-10 LAB — CBC
HEMATOCRIT: 39 % (ref 39.0–52.0)
HEMOGLOBIN: 12.5 g/dL — AB (ref 13.0–17.0)
MCH: 27.2 pg (ref 26.0–34.0)
MCHC: 32.1 g/dL (ref 30.0–36.0)
MCV: 85 fL (ref 78.0–100.0)
Platelets: 238 10*3/uL (ref 150–400)
RBC: 4.59 MIL/uL (ref 4.22–5.81)
RDW: 15.9 % — ABNORMAL HIGH (ref 11.5–15.5)
WBC: 9.1 10*3/uL (ref 4.0–10.5)

## 2016-11-10 MED ORDER — FUROSEMIDE 10 MG/ML IJ SOLN
40.0000 mg | Freq: Once | INTRAMUSCULAR | Status: AC
  Administered 2016-11-10: 40 mg via INTRAVENOUS
  Filled 2016-11-10: qty 4

## 2016-11-12 ENCOUNTER — Emergency Department (HOSPITAL_COMMUNITY): Payer: Medicare Other

## 2016-11-12 ENCOUNTER — Encounter (HOSPITAL_COMMUNITY): Payer: Self-pay | Admitting: Emergency Medicine

## 2016-11-12 DIAGNOSIS — Y999 Unspecified external cause status: Secondary | ICD-10-CM | POA: Diagnosis not present

## 2016-11-12 DIAGNOSIS — J45909 Unspecified asthma, uncomplicated: Secondary | ICD-10-CM | POA: Diagnosis not present

## 2016-11-12 DIAGNOSIS — I110 Hypertensive heart disease with heart failure: Secondary | ICD-10-CM | POA: Diagnosis not present

## 2016-11-12 DIAGNOSIS — I5033 Acute on chronic diastolic (congestive) heart failure: Secondary | ICD-10-CM | POA: Insufficient documentation

## 2016-11-12 DIAGNOSIS — Z794 Long term (current) use of insulin: Secondary | ICD-10-CM | POA: Diagnosis not present

## 2016-11-12 DIAGNOSIS — Y929 Unspecified place or not applicable: Secondary | ICD-10-CM | POA: Insufficient documentation

## 2016-11-12 DIAGNOSIS — S39012A Strain of muscle, fascia and tendon of lower back, initial encounter: Secondary | ICD-10-CM | POA: Diagnosis not present

## 2016-11-12 DIAGNOSIS — Y939 Activity, unspecified: Secondary | ICD-10-CM | POA: Diagnosis not present

## 2016-11-12 DIAGNOSIS — E1140 Type 2 diabetes mellitus with diabetic neuropathy, unspecified: Secondary | ICD-10-CM | POA: Diagnosis not present

## 2016-11-12 DIAGNOSIS — M545 Low back pain: Secondary | ICD-10-CM | POA: Diagnosis present
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
## 2016-11-13 ENCOUNTER — Emergency Department (HOSPITAL_COMMUNITY)
Admission: EM | Admit: 2016-11-13 | Discharge: 2016-11-13 | Disposition: A | Discharge status: Home / Self Care | Payer: Medicare Other | Attending: Emergency Medicine | Admitting: Emergency Medicine

## 2016-11-13 DIAGNOSIS — S39012A Strain of muscle, fascia and tendon of lower back, initial encounter: Secondary | ICD-10-CM

## 2016-11-13 MED ORDER — METHOCARBAMOL 500 MG PO TABS: 500.0000 mg | ORAL_TABLET | Freq: Two times a day (BID) | ORAL | 0 refills | Status: DC
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
## 2016-11-13 NOTE — ED Notes (Signed)
Pt refusing to get undress or connected to the monitor, states he is not willing to wait longer to be seen.

## 2016-11-13 NOTE — ED Notes (Signed)
Patient Alert and oriented X4. Stable and ambulatory. Patient verbalized understanding of the discharge instructions.  Patient belongings were taken by the patient.  

## 2017-01-05 ENCOUNTER — Encounter (HOSPITAL_COMMUNITY): Payer: Self-pay | Admitting: Emergency Medicine

## 2017-01-05 ENCOUNTER — Emergency Department (HOSPITAL_COMMUNITY): Payer: Medicare Other

## 2017-01-05 ENCOUNTER — Emergency Department (HOSPITAL_COMMUNITY)
Admission: EM | Admit: 2017-01-05 | Discharge: 2017-01-05 | Disposition: A | Discharge status: Home / Self Care | Payer: Medicare Other | Attending: Emergency Medicine | Admitting: Emergency Medicine

## 2017-01-05 DIAGNOSIS — R072 Precordial pain: Secondary | ICD-10-CM | POA: Insufficient documentation

## 2017-01-05 LAB — CBC
HCT: 39.3 % (ref 39.0–52.0)
Hemoglobin: 12.9 g/dL — ABNORMAL LOW (ref 13.0–17.0)
MCH: 27.2 pg (ref 26.0–34.0)
MCHC: 32.8 g/dL (ref 30.0–36.0)
MCV: 82.9 fL (ref 78.0–100.0)
PLATELETS: 196 10*3/uL (ref 150–400)
RBC: 4.74 MIL/uL (ref 4.22–5.81)
RDW: 14.7 % (ref 11.5–15.5)
WBC: 8.1 10*3/uL (ref 4.0–10.5)

## 2017-01-05 LAB — COMPREHENSIVE METABOLIC PANEL WITH GFR
ALT: 12 U/L — ABNORMAL LOW (ref 17–63)
AST: 18 U/L (ref 15–41)
Albumin: 2.7 g/dL — ABNORMAL LOW (ref 3.5–5.0)
Alkaline Phosphatase: 81 U/L (ref 38–126)
Anion gap: 9 (ref 5–15)
BUN: 35 mg/dL — ABNORMAL HIGH (ref 6–20)
CO2: 28 mmol/L (ref 22–32)
Calcium: 8.6 mg/dL — ABNORMAL LOW (ref 8.9–10.3)
Chloride: 102 mmol/L (ref 101–111)
Creatinine, Ser: 2.7 mg/dL — ABNORMAL HIGH (ref 0.61–1.24)
GFR calc Af Amer: 30 mL/min — ABNORMAL LOW
GFR calc non Af Amer: 26 mL/min — ABNORMAL LOW
Glucose, Bld: 158 mg/dL — ABNORMAL HIGH (ref 65–99)
Potassium: 3.8 mmol/L (ref 3.5–5.1)
Sodium: 139 mmol/L (ref 135–145)
Total Bilirubin: 0.4 mg/dL (ref 0.3–1.2)
Total Protein: 6.4 g/dL — ABNORMAL LOW (ref 6.5–8.1)

## 2017-01-05 LAB — I-STAT TROPONIN, ED
TROPONIN I, POC: 0 ng/mL (ref 0.00–0.08)
Troponin i, poc: 0 ng/mL (ref 0.00–0.08)

## 2017-01-05 LAB — BRAIN NATRIURETIC PEPTIDE: B NATRIURETIC PEPTIDE 5: 44.9 pg/mL (ref 0.0–100.0)

## 2017-01-05 NOTE — Discharge Instructions (Signed)
Return for any new or worse symptoms. Make a follow-up appointment with your cardiologist. Today's workup without any significant findings.

## 2017-01-05 NOTE — ED Triage Notes (Signed)
Pt arrives via [facility] with c/o of tightness that started at 0800 this am.  Pt denies N/V.  Pt is on continuous O2 at home (2L).  [facility] administered 324 aspirin PTA.  Vitals 130/90, HR 68, RR 15, SPO2 98% on 3L, CBG: 198.

## 2017-01-28 ENCOUNTER — Emergency Department (HOSPITAL_COMMUNITY): Payer: Medicare Other

## 2017-01-28 ENCOUNTER — Emergency Department (HOSPITAL_COMMUNITY)
Admission: EM | Admit: 2017-01-28 | Discharge: 2017-01-28 | Disposition: A | Discharge status: Home / Self Care | Payer: Medicare Other | Attending: Emergency Medicine | Admitting: Emergency Medicine

## 2017-01-28 DIAGNOSIS — Z794 Long term (current) use of insulin: Secondary | ICD-10-CM | POA: Insufficient documentation

## 2017-01-28 DIAGNOSIS — Z79899 Other long term (current) drug therapy: Secondary | ICD-10-CM | POA: Diagnosis not present

## 2017-01-28 DIAGNOSIS — D573 Sickle-cell trait: Secondary | ICD-10-CM | POA: Insufficient documentation

## 2017-01-28 DIAGNOSIS — E119 Type 2 diabetes mellitus without complications: Secondary | ICD-10-CM | POA: Insufficient documentation

## 2017-01-28 DIAGNOSIS — I110 Hypertensive heart disease with heart failure: Secondary | ICD-10-CM | POA: Diagnosis not present

## 2017-01-28 DIAGNOSIS — J45909 Unspecified asthma, uncomplicated: Secondary | ICD-10-CM | POA: Diagnosis not present

## 2017-01-28 DIAGNOSIS — I509 Heart failure, unspecified: Secondary | ICD-10-CM

## 2017-01-28 DIAGNOSIS — I5033 Acute on chronic diastolic (congestive) heart failure: Secondary | ICD-10-CM | POA: Insufficient documentation

## 2017-01-28 DIAGNOSIS — R0602 Shortness of breath: Secondary | ICD-10-CM | POA: Diagnosis present

## 2017-01-28 LAB — BRAIN NATRIURETIC PEPTIDE: B Natriuretic Peptide: 64.8 pg/mL (ref 0.0–100.0)

## 2017-01-28 LAB — CBC
HCT: 37.1 % — ABNORMAL LOW (ref 39.0–52.0)
HEMOGLOBIN: 12 g/dL — AB (ref 13.0–17.0)
MCH: 26.8 pg (ref 26.0–34.0)
MCHC: 32.3 g/dL (ref 30.0–36.0)
MCV: 83 fL (ref 78.0–100.0)
Platelets: 225 10*3/uL (ref 150–400)
RBC: 4.47 MIL/uL (ref 4.22–5.81)
RDW: 14.4 % (ref 11.5–15.5)
WBC: 7 10*3/uL (ref 4.0–10.5)

## 2017-01-28 LAB — BASIC METABOLIC PANEL
ANION GAP: 8 (ref 5–15)
BUN: 22 mg/dL — ABNORMAL HIGH (ref 6–20)
CALCIUM: 8.6 mg/dL — AB (ref 8.9–10.3)
CO2: 28 mmol/L (ref 22–32)
Chloride: 103 mmol/L (ref 101–111)
Creatinine, Ser: 2.33 mg/dL — ABNORMAL HIGH (ref 0.61–1.24)
GFR calc non Af Amer: 31 mL/min — ABNORMAL LOW (ref >60)
GFR, EST AFRICAN AMERICAN: 36 mL/min — AB (ref >60)
GLUCOSE: 254 mg/dL — AB (ref 65–99)
Potassium: 3.3 mmol/L — ABNORMAL LOW (ref 3.5–5.1)
SODIUM: 139 mmol/L (ref 135–145)

## 2017-01-28 LAB — I-STAT TROPONIN, ED: TROPONIN I, POC: 0.01 ng/mL (ref 0.00–0.08)

## 2017-01-28 MED ORDER — POLYETHYLENE GLYCOL 3350 17 G PO PACK: 17.0000 g | PACK | Freq: Every day | ORAL | 0 refills | Status: DC | PRN

## 2017-01-28 MED ORDER — FUROSEMIDE 10 MG/ML IJ SOLN
80.0000 mg | Freq: Once | INTRAMUSCULAR | Status: AC
  Administered 2017-01-28: 80 mg via INTRAVENOUS
  Filled 2017-01-28: qty 8

## 2017-01-28 NOTE — ED Triage Notes (Signed)
Pt via [facility] c/o generalized stiffness and burning. generalized Edema is presents. Hx of CHF wears 3L [place] at home. Denies SOB. Productive cough.   Vitals  170/100 97% 3L cbg 220

## 2017-01-28 NOTE — ED Notes (Signed)
Patient to xray.

## 2017-01-28 NOTE — ED Notes (Signed)
Got patient undress on the monitor did ekg shown to Dr Pickering patient is resting with call bell in reach 

## 2017-01-28 NOTE — ED Provider Notes (Signed)
[facility] DEPT Provider Note   CSN: [id number] Arrival date & time: 01/28/17  1478     History   Chief Complaint Chief Complaint  Patient presents with  . Generalized EDEMA  . Congestive Heart Failure    HPI Kenneth Fox is a 46 y.o. male.  HPI Patient presents with shortness of breath from earlier today. History of congestive heart failure on chronic oxygen. States that this morning he was more shortness of breath and has more tightness in his legs. No fevers. Slight mid chest pain. States he is not able to cook for himself and had someone bring in food that had a lot of liquid in a yesterday. States he has not checked his weight a couple days but it was 341 pounds 2 days ago. Past Medical History:  Diagnosis Date  . Asthma   . CHF (congestive heart failure) ([place])   . Diabetes ([place])   . Gastroparesis   . GERD (gastroesophageal reflux disease)   . Hypertension   . Kidney disorder   . Neuropathy   . Obesity   . Obstructive sleep apnea    will not wear CPAP  . Renal insufficiency   . Sickle cell trait [facility])     Patient Active Problem List   Diagnosis Date Noted  . Acute on chronic respiratory failure with hypoxia and hypercapnia ([place]) 10/26/2016  . Uncontrolled hypertension 10/26/2016  . Fluid overload 10/13/2016  . Acute on chronic diastolic CHF (congestive heart failure) ([place]) 10/13/2016  . CHF (congestive heart failure) ([place]) 10/13/2016  . Acute kidney injury ([place]) 05/09/2016  . Dysphagia 12/12/2015  . Intractable nausea and vomiting 12/11/2015  . Leukocytosis 06/02/2015  . Morbid obesity due to excess calories ([place]) 06/02/2015  . Uncontrolled type 2 diabetes mellitus with diabetic neuropathy, with long-term current use of insulin ([place])   . Elevated troponin 06/01/2015  . AKI (acute kidney injury) ([place]) 12/03/2013  . Adverse reaction to lisinopril 12/03/2013  . Hypokalemia 11/25/2013  . Diabetic gastroparesis ([place]) 11/17/2013  . Obstructive sleep  apnea 11/07/2012  . Essential hypertension 05/18/2011    Past Surgical History:  Procedure Laterality Date  . HIP FRACTURE SURGERY Right 1999   "put a plate in"       Home Medications    Prior to Admission medications   Medication Sig Start Date End Date Taking? Authorizing Provider  blood glucose meter kit and supplies Dispense based on patient and insurance preference. Use up to four times daily as directed. (FOR ICD-9 250.00, 250.01). 05/11/16  Yes Patrecia Pour, MD  cloNIDine (CATAPRES) 0.2 MG tablet Take 0.2 mg by mouth 2 (two) times daily. 12/11/16  Yes [provider]  furosemide (LASIX) 80 MG tablet Take 1 tablet (80 mg total) by mouth 2 (two) times daily. 10/15/16 01/28/17 Yes Aline August, MD  hydrALAZINE (APRESOLINE) 50 MG tablet Take 1.5 tablets (75 mg total) by mouth 3 (three) times daily. 10/26/16  Yes Dhungel, Nishant, MD  insulin aspart (NOVOLOG) 100 UNIT/ML injection Inject 10-20 Units into the skin 3 (three) times daily with meals. per sliding scale CBG 70 - 120: 0 units CBG 121 - 150: 2 units CBG 151 - 200: 3 units CBG 201 - 250: 5 units CBG 251 - 300: 8 units CBG 301 - 350: 11 units CBG 351 - 400: 15 units 06/06/15  Yes Robbie Lis, MD  insulin detemir (LEVEMIR) 100 UNIT/ML injection Inject 0.8 mLs (80 Units total) into the skin every evening. 12/13/15  Yes  Rai, Vernelle Emerald, MD  metoprolol tartrate (LOPRESSOR) 25 MG tablet Take 1 tablet (25 mg total) by mouth 2 (two) times daily. 10/26/16  Yes Dhungel, Nishant, MD  Oxycodone HCl 10 MG TABS Take 10 mg by mouth every 6 (six) hours as needed for pain.   Yes [provider]  potassium chloride SA (K-DUR,KLOR-CON) 20 MEQ tablet Take 20 mEq by mouth daily.   Yes [provider]  pregabalin (LYRICA) 150 MG capsule Take 150 mg by mouth 3 (three) times daily.   Yes [provider]  methocarbamol (ROBAXIN) 500 MG tablet Take 1 tablet (500 mg total) by mouth 2 (two) times daily. 11/13/16    Charlann Lange, PA-C  polyethylene glycol (MIRALAX / GLYCOLAX) packet Take 17 g by mouth daily as needed for mild constipation. 01/28/17   Davonna Belling, MD    Family History Family History  Problem Relation Age of Onset  . Sickle cell anemia Mother   . Heart attack Mother   . Diabetes Father   . Neuropathy Father   . Hypertension Father   . Aneurysm Father   . Kidney disease Brother   . Kidney disease Sister     Social History Social History  Substance Use Topics  . Smoking status: Never Smoker  . Smokeless tobacco: Never Used  . Alcohol use No     Allergies   Angiotensin receptor blockers and Lisinopril   Review of Systems Review of Systems  Constitutional: Negative for appetite change.  HENT: Negative for congestion.   Respiratory: Positive for cough and shortness of breath.   Cardiovascular: Positive for chest pain and leg swelling.  Gastrointestinal: Positive for constipation. Negative for abdominal pain.  Genitourinary: Negative for flank pain.  Musculoskeletal: Negative for back pain.  Skin: Negative for rash.  Neurological: Negative for seizures.  Hematological: Negative for adenopathy.  Psychiatric/Behavioral: Negative for confusion.     Physical Exam Updated Vital Signs BP (!) 139/96   Pulse 69   Temp 98.3 F (36.8 C) (Oral)   Resp 17   Ht 5' 11"  (1.803 m)   Wt (!) 155.4 kg (342 lb 8 oz)   SpO2 91%   BMI 47.77 kg/m   Physical Exam  Constitutional: He appears well-developed.  Patient is morbidly obese  HENT:  Head: Normocephalic.  Eyes: Pupils are equal, round, and reactive to light.  Neck: Neck supple.  Pulmonary/Chest: Effort normal. He has no rales.  Abdominal: Soft. There is no tenderness.  Musculoskeletal: He exhibits edema.  Moderate pitting edema to bilateral lower extremities.  Skin: Skin is warm. Capillary refill takes less than 2 seconds.  Psychiatric: He has a normal mood and affect.     ED Treatments / Results   Labs (all labs ordered are listed, but only abnormal results are displayed) Labs Reviewed  BASIC METABOLIC PANEL - Abnormal; Notable for the following:       Result Value   Potassium 3.3 (*)    Glucose, Bld 254 (*)    BUN 22 (*)    Creatinine, Ser 2.33 (*)    Calcium 8.6 (*)    GFR calc non Af Amer 31 (*)    GFR calc Af Amer 36 (*)    All other components within normal limits  CBC - Abnormal; Notable for the following:    Hemoglobin 12.0 (*)    HCT 37.1 (*)    All other components within normal limits  BRAIN NATRIURETIC PEPTIDE  I-STAT TROPONIN, ED    EKG  EKG Interpretation  Date/Time:  Thursday January 28 2017 09:31:53 EDT Ventricular Rate:  69 PR Interval:    QRS Duration: 115 QT Interval:  480 QTC Calculation: 515 R Axis:   72 Text Interpretation:  Sinus rhythm Nonspecific intraventricular conduction delay Borderline low voltage, extremity leads Confirmed by Davonna Belling [phone]) on 01/28/2017 9:45:54 AM       Radiology Dg Chest 2 View  Result Date: 01/28/2017 CLINICAL DATA:  Productive cough.  Feet swelling. EXAM: CHEST  2 VIEW COMPARISON:  01/05/2017 FINDINGS: Mildly low lung volumes with streaky opacities, chronic at the right base. Cardiomegaly and chronic vascular pedicle widening. No edema, effusion, or air bronchogram. EKG leads create artifact over the chest. IMPRESSION: Low volume chest with atelectasis and scarring. Electronically Signed   By: Monte Fantasia M.D.   On: 01/28/2017 10:08    Procedures Procedures (including critical care time)  Medications Ordered in ED Medications  furosemide (LASIX) injection 80 mg (80 mg Intravenous Given 01/28/17 1243)     Initial Impression / Assessment and Plan / ED Course  I have reviewed the triage vital signs and the nursing notes.  Pertinent labs & imaging results that were available during my care of the patient were reviewed by me and considered in my medical decision making (see chart for details).      Patient with shortness of breath and edema. Weight is mildly up. Will give IV dose of Lasix. X-ray reassuring. Also has some constipation. Will get relaxed. Patient appears safe to discharge home.  Final Clinical Impressions(s) / ED Diagnoses   Final diagnoses:  Acute on chronic congestive heart failure, unspecified heart failure type [facility])    New Prescriptions Discharge Medication List as of 01/28/2017  2:36 PM       Davonna Belling, MD 01/28/17 [phone]

## 2017-02-04 ENCOUNTER — Encounter (HOSPITAL_COMMUNITY): Payer: Self-pay | Admitting: Emergency Medicine

## 2017-02-04 ENCOUNTER — Emergency Department (HOSPITAL_COMMUNITY): Payer: Medicare Other

## 2017-02-04 ENCOUNTER — Emergency Department (HOSPITAL_COMMUNITY)
Admission: EM | Admit: 2017-02-04 | Discharge: 2017-02-05 | Disposition: A | Discharge status: Home / Self Care | Payer: Medicare Other | Attending: Physician Assistant | Admitting: Physician Assistant

## 2017-02-04 DIAGNOSIS — Z794 Long term (current) use of insulin: Secondary | ICD-10-CM | POA: Insufficient documentation

## 2017-02-04 DIAGNOSIS — R0789 Other chest pain: Secondary | ICD-10-CM | POA: Diagnosis not present

## 2017-02-04 DIAGNOSIS — Z79899 Other long term (current) drug therapy: Secondary | ICD-10-CM | POA: Diagnosis not present

## 2017-02-04 DIAGNOSIS — R1030 Lower abdominal pain, unspecified: Secondary | ICD-10-CM | POA: Diagnosis present

## 2017-02-04 DIAGNOSIS — R0989 Other specified symptoms and signs involving the circulatory and respiratory systems: Secondary | ICD-10-CM | POA: Insufficient documentation

## 2017-02-04 DIAGNOSIS — R112 Nausea with vomiting, unspecified: Secondary | ICD-10-CM | POA: Insufficient documentation

## 2017-02-04 DIAGNOSIS — E119 Type 2 diabetes mellitus without complications: Secondary | ICD-10-CM | POA: Diagnosis not present

## 2017-02-04 DIAGNOSIS — R197 Diarrhea, unspecified: Secondary | ICD-10-CM

## 2017-02-04 DIAGNOSIS — I110 Hypertensive heart disease with heart failure: Secondary | ICD-10-CM | POA: Insufficient documentation

## 2017-02-04 DIAGNOSIS — J45909 Unspecified asthma, uncomplicated: Secondary | ICD-10-CM | POA: Diagnosis not present

## 2017-02-04 DIAGNOSIS — I5032 Chronic diastolic (congestive) heart failure: Secondary | ICD-10-CM | POA: Insufficient documentation

## 2017-02-04 DIAGNOSIS — R1084 Generalized abdominal pain: Secondary | ICD-10-CM

## 2017-02-04 LAB — URINALYSIS, ROUTINE W REFLEX MICROSCOPIC
Bacteria, UA: NONE SEEN
Bilirubin Urine: NEGATIVE
Glucose, UA: >=500 mg/dL — AB
Ketones, ur: 5 mg/dL — AB
Leukocytes, UA: NEGATIVE
Nitrite: NEGATIVE
Protein, ur: >=300 mg/dL — AB
Specific Gravity, Urine: 1.013 (ref 1.005–1.030)
Squamous Epithelial / HPF: NONE SEEN
pH: 6 (ref 5.0–8.0)

## 2017-02-04 LAB — CBC
HCT: 41.6 % (ref 39.0–52.0)
Hemoglobin: 13.3 g/dL (ref 13.0–17.0)
MCH: 27 pg (ref 26.0–34.0)
MCHC: 32 g/dL (ref 30.0–36.0)
MCV: 84.4 fL (ref 78.0–100.0)
Platelets: 295 10*3/uL (ref 150–400)
RBC: 4.93 MIL/uL (ref 4.22–5.81)
RDW: 15.1 % (ref 11.5–15.5)
WBC: 13.1 10*3/uL — ABNORMAL HIGH (ref 4.0–10.5)

## 2017-02-04 LAB — COMPREHENSIVE METABOLIC PANEL
ALK PHOS: 95 U/L (ref 38–126)
ALT: 14 U/L — ABNORMAL LOW (ref 17–63)
AST: 20 U/L (ref 15–41)
Albumin: 2.9 g/dL — ABNORMAL LOW (ref 3.5–5.0)
Anion gap: 12 (ref 5–15)
BUN: 23 mg/dL — AB (ref 6–20)
CALCIUM: 8.7 mg/dL — AB (ref 8.9–10.3)
CHLORIDE: 105 mmol/L (ref 101–111)
CO2: 26 mmol/L (ref 22–32)
CREATININE: 2.4 mg/dL — AB (ref 0.61–1.24)
GFR calc non Af Amer: 30 mL/min — ABNORMAL LOW (ref >60)
GFR, EST AFRICAN AMERICAN: 35 mL/min — AB (ref >60)
GLUCOSE: 208 mg/dL — AB (ref 65–99)
Potassium: 3.7 mmol/L (ref 3.5–5.1)
SODIUM: 143 mmol/L (ref 135–145)
Total Bilirubin: 0.5 mg/dL (ref 0.3–1.2)
Total Protein: 6.9 g/dL (ref 6.5–8.1)

## 2017-02-04 LAB — CBG MONITORING, ED
Glucose-Capillary: 195 mg/dL — ABNORMAL HIGH (ref 65–99)
Glucose-Capillary: 290 mg/dL — ABNORMAL HIGH (ref 65–99)

## 2017-02-04 LAB — LIPASE, BLOOD: LIPASE: 67 U/L — AB (ref 11–51)

## 2017-02-04 MED ORDER — ONDANSETRON HCL 4 MG/2ML IJ SOLN: 4.0000 mg | Freq: Once | INTRAMUSCULAR | Status: DC

## 2017-02-04 MED ORDER — MORPHINE SULFATE (PF) 4 MG/ML IV SOLN
4.0000 mg | Freq: Once | INTRAVENOUS | Status: AC
  Administered 2017-02-04: 4 mg via INTRAVENOUS
  Filled 2017-02-04: qty 1

## 2017-02-04 MED ORDER — ONDANSETRON 4 MG PO TBDP
ORAL_TABLET | ORAL | Status: AC
  Administered 2017-02-04: 4 mg
  Filled 2017-02-04: qty 1

## 2017-02-04 MED ORDER — IOPAMIDOL (ISOVUE-300) INJECTION 61%
INTRAVENOUS | Status: AC
  Administered 2017-02-04: 100 mL
  Filled 2017-02-04: qty 100

## 2017-02-04 MED ORDER — HALOPERIDOL LACTATE 5 MG/ML IJ SOLN: 5.0000 mg | Freq: Once | INTRAMUSCULAR | Status: DC

## 2017-02-04 MED ORDER — OXYCODONE-ACETAMINOPHEN 5-325 MG PO TABS
ORAL_TABLET | ORAL | Status: AC
  Filled 2017-02-04: qty 1

## 2017-02-04 MED ORDER — SODIUM CHLORIDE 0.9 % IV BOLUS (SEPSIS)
1000.0000 mL | Freq: Once | INTRAVENOUS | Status: AC
  Administered 2017-02-04: 1000 mL via INTRAVENOUS

## 2017-02-04 MED ORDER — OXYCODONE-ACETAMINOPHEN 5-325 MG PO TABS
1.0000 | ORAL_TABLET | ORAL | Status: DC | PRN
  Administered 2017-02-04: 1 via ORAL

## 2017-02-04 MED ORDER — ONDANSETRON 4 MG PO TBDP
4.0000 mg | ORAL_TABLET | Freq: Once | ORAL | Status: AC | PRN
  Administered 2017-02-04: 4 mg via ORAL
  Filled 2017-02-04: qty 1

## 2017-02-04 MED ORDER — HALOPERIDOL LACTATE 5 MG/ML IJ SOLN
2.5000 mg | Freq: Once | INTRAMUSCULAR | Status: AC
  Administered 2017-02-04: 2.5 mg via INTRAVENOUS
  Filled 2017-02-04: qty 1

## 2017-02-04 NOTE — ED Notes (Signed)
States nausea and emesis along with Lower abdominal pain 10/10 cramping.

## 2017-02-04 NOTE — ED Notes (Signed)
Pt requesting something for nausea  

## 2017-02-04 NOTE — ED Provider Notes (Signed)
[facility] DEPT Provider Note   CSN: [id number] Arrival date & time: 02/04/17  1448     History   Chief Complaint Chief Complaint  Patient presents with  . Nausea  . Emesis  . Diarrhea    HPI Kenneth Fox is a 46 y.o. male with history of diabetes, gastroparesis, CHF, asthma, renal insufficiency, hypertension who presents with a one-day history of abdominal pain, nausea, vomiting, and diarrhea. His symptoms started after eating an old steak. He denies any bloody emesis or diarrhea. He denies any urinary symptoms. His abdominal pain is mostly lower abdominal pain. He denies any fevers. He has not taken any medications at home for his symptoms. Patient reports he has some associated chest tightness, but no chest pain or shortness of breath.  HPI  Past Medical History:  Diagnosis Date  . Asthma   . CHF (congestive heart failure) ([place])   . Diabetes ([place])   . Gastroparesis   . GERD (gastroesophageal reflux disease)   . Hypertension   . Kidney disorder   . Neuropathy   . Obesity   . Obstructive sleep apnea    will not wear CPAP  . Renal insufficiency   . Sickle cell trait [facility])     Patient Active Problem List   Diagnosis Date Noted  . Acute on chronic respiratory failure with hypoxia and hypercapnia ([place]) 10/26/2016  . Uncontrolled hypertension 10/26/2016  . Fluid overload 10/13/2016  . Acute on chronic diastolic CHF (congestive heart failure) ([place]) 10/13/2016  . CHF (congestive heart failure) ([place]) 10/13/2016  . Acute kidney injury ([place]) 05/09/2016  . Dysphagia 12/12/2015  . Intractable nausea and vomiting 12/11/2015  . Leukocytosis 06/02/2015  . Morbid obesity due to excess calories ([place]) 06/02/2015  . Uncontrolled type 2 diabetes mellitus with diabetic neuropathy, with long-term current use of insulin ([place])   . Elevated troponin 06/01/2015  . AKI (acute kidney injury) ([place]) 12/03/2013  . Adverse reaction to lisinopril 12/03/2013  . Hypokalemia 11/25/2013    . Diabetic gastroparesis ([place]) 11/17/2013  . Obstructive sleep apnea 11/07/2012  . Essential hypertension 05/18/2011    Past Surgical History:  Procedure Laterality Date  . HIP FRACTURE SURGERY Right 1999   "put a plate in"       Home Medications    Prior to Admission medications   Medication Sig Start Date End Date Taking? Authorizing Provider  acetaminophen (TYLENOL) 500 MG tablet Take 1 tablet (500 mg total) by mouth every 6 (six) hours as needed. 02/05/17   Dayven Linsley, Bea Graff, PA-C  blood glucose meter kit and supplies Dispense based on patient and insurance preference. Use up to four times daily as directed. (FOR ICD-9 250.00, 250.01). 05/11/16   Patrecia Pour, MD  cloNIDine (CATAPRES) 0.2 MG tablet Take 0.2 mg by mouth 2 (two) times daily. 12/11/16   [provider]  furosemide (LASIX) 80 MG tablet Take 1 tablet (80 mg total) by mouth 2 (two) times daily. 10/15/16 01/28/17  Aline August, MD  hydrALAZINE (APRESOLINE) 50 MG tablet Take 1.5 tablets (75 mg total) by mouth 3 (three) times daily. 10/26/16   Dhungel, Nishant, MD  insulin aspart (NOVOLOG) 100 UNIT/ML injection Inject 10-20 Units into the skin 3 (three) times daily with meals. per sliding scale CBG 70 - 120: 0 units CBG 121 - 150: 2 units CBG 151 - 200: 3 units CBG 201 - 250: 5 units CBG 251 - 300: 8 units CBG 301 - 350: 11 units CBG 351 - 400: 15  units 06/06/15   Robbie Lis, MD  insulin detemir (LEVEMIR) 100 UNIT/ML injection Inject 0.8 mLs (80 Units total) into the skin every evening. 12/13/15   Rai, Vernelle Emerald, MD  methocarbamol (ROBAXIN) 500 MG tablet Take 1 tablet (500 mg total) by mouth 2 (two) times daily. 11/13/16   Charlann Lange, PA-C  metoCLOPramide (REGLAN) 10 MG tablet Take 1 tablet (10 mg total) by mouth every 6 (six) hours. 02/05/17   Franziska Podgurski, Bea Graff, PA-C  metoprolol tartrate (LOPRESSOR) 25 MG tablet Take 1 tablet (25 mg total) by mouth 2 (two) times daily. 10/26/16   Dhungel, Flonnie Overman, MD  Oxycodone  HCl 10 MG TABS Take 10 mg by mouth every 6 (six) hours as needed for pain.    [provider]  polyethylene glycol (MIRALAX / GLYCOLAX) packet Take 17 g by mouth daily as needed for mild constipation. 01/28/17   Davonna Belling, MD  potassium chloride SA (K-DUR,KLOR-CON) 20 MEQ tablet Take 20 mEq by mouth daily.    [provider]  pregabalin (LYRICA) 150 MG capsule Take 150 mg by mouth 3 (three) times daily.    [provider]    Family History Family History  Problem Relation Age of Onset  . Sickle cell anemia Mother   . Heart attack Mother   . Diabetes Father   . Neuropathy Father   . Hypertension Father   . Aneurysm Father   . Kidney disease Brother   . Kidney disease Sister     Social History Social History  Substance Use Topics  . Smoking status: Never Smoker  . Smokeless tobacco: Never Used  . Alcohol use No     Allergies   Angiotensin receptor blockers and Lisinopril   Review of Systems Review of Systems  Constitutional: Negative for chills and fever.  HENT: Negative for facial swelling and sore throat.   Respiratory: Positive for chest tightness. Negative for shortness of breath.   Cardiovascular: Negative for chest pain.  Gastrointestinal: Positive for abdominal pain, diarrhea, nausea and vomiting. Negative for blood in stool.  Genitourinary: Negative for dysuria.  Musculoskeletal: Negative for back pain.  Skin: Negative for rash and wound.  Neurological: Negative for headaches.  Psychiatric/Behavioral: The patient is not nervous/anxious.      Physical Exam Updated Vital Signs BP (!) 175/89   Pulse (!) 102   Temp 98.1 F (36.7 C) (Oral)   Resp 19   Ht _0  (1.854 m)   Wt (!) 154.2 kg (340 lb)   SpO2 97%   BMI 44.86 kg/m   Physical Exam  Constitutional: He appears well-developed and well-nourished. No distress.  HENT:  Head: Normocephalic and atraumatic.  Mouth/Throat: Oropharynx is clear and moist. No oropharyngeal  exudate.  Eyes: Pupils are equal, round, and reactive to light. Conjunctivae are normal. Right eye exhibits no discharge. Left eye exhibits no discharge. No scleral icterus.  Neck: Normal range of motion. Neck supple. No thyromegaly present.  Cardiovascular: Regular rhythm, normal heart sounds and intact distal pulses.  Exam reveals no gallop and no friction rub.   No murmur heard. Pulmonary/Chest: Effort normal. No stridor. No respiratory distress. He has decreased breath sounds. He has no wheezes. He has rhonchi. He has no rales.  Abdominal: Soft. Bowel sounds are normal. He exhibits no distension. There is generalized tenderness (significant). There is guarding. There is no rebound.  Musculoskeletal: He exhibits no edema.  Lymphadenopathy:    He has no cervical adenopathy.  Neurological: He is alert. Coordination  normal.  Skin: Skin is warm and dry. No rash noted. He is not diaphoretic. No pallor.  Psychiatric: He has a normal mood and affect.  Nursing note and vitals reviewed.    ED Treatments / Results  Labs (all labs ordered are listed, but only abnormal results are displayed) Labs Reviewed  LIPASE, BLOOD - Abnormal; Notable for the following:       Result Value   Lipase 67 (*)    All other components within normal limits  COMPREHENSIVE METABOLIC PANEL - Abnormal; Notable for the following:    Glucose, Bld 208 (*)    BUN 23 (*)    Creatinine, Ser 2.40 (*)    Calcium 8.7 (*)    Albumin 2.9 (*)    ALT 14 (*)    GFR calc non Af Amer 30 (*)    GFR calc Af Amer 35 (*)    All other components within normal limits  CBC - Abnormal; Notable for the following:    WBC 13.1 (*)    All other components within normal limits  URINALYSIS, ROUTINE W REFLEX MICROSCOPIC - Abnormal; Notable for the following:    Color, Urine STRAW (*)    Glucose, UA >=500 (*)    Hgb urine dipstick SMALL (*)    Ketones, ur 5 (*)    Protein, ur >=300 (*)    All other components within normal limits  CBG  MONITORING, ED - Abnormal; Notable for the following:    Glucose-Capillary 195 (*)    All other components within normal limits  CBG MONITORING, ED - Abnormal; Notable for the following:    Glucose-Capillary 290 (*)    All other components within normal limits    EKG  EKG Interpretation None       Radiology Dg Chest 2 View  Result Date: 02/04/2017 CLINICAL DATA:  Chest tightness with ronchi. EXAM: CHEST  2 VIEW COMPARISON:  None. FINDINGS: Low lung volumes with platelike atelectasis and/or scarring at the right lung base. Stable cardiomegaly. Stable appearance of the mediastinum without definite aortic aneurysm. No acute osseous abnormality. IMPRESSION: Low lung volumes with right basilar atelectasis and/or scarring. Electronically Signed   By: Ashley Royalty M.D.   On: 02/04/2017 22:17   Ct Abdomen Pelvis W Contrast  Result Date: 02/04/2017 CLINICAL DATA:  Nausea vomiting and diarrhea today. Lower abdominal pain. EXAM: CT ABDOMEN AND PELVIS WITH CONTRAST TECHNIQUE: Multidetector CT imaging of the abdomen and pelvis was performed using the standard protocol following bolus administration of intravenous contrast. CONTRAST:  132m ISOVUE-300 IOPAMIDOL (ISOVUE-300) INJECTION 61% COMPARISON:  12/11/2015 FINDINGS: Lower chest: Stable linear opacities in the lung bases, unchanged from 12/11/2015. Likely scarring. No acute findings in the lower chest. Hepatobiliary: No focal liver abnormality is seen. No gallstones, gallbladder wall thickening, or biliary dilatation. Pancreas: Unremarkable. No pancreatic ductal dilatation or surrounding inflammatory changes. Spleen: Normal in size without focal abnormality. Adrenals/Urinary Tract: Adrenal glands are unremarkable. Kidneys are normal, without renal calculi, focal lesion, or hydronephrosis. Bladder is unremarkable. Stomach/Bowel: Stomach is within normal limits. Appendix is normal. No evidence of bowel wall thickening, distention, or inflammatory changes.  Vascular/Lymphatic: No significant vascular findings are present. No enlarged abdominal or pelvic lymph nodes. Reproductive: Unremarkable Other: Umbilical hernia containing unobstructed small bowel. No inflammation or fluid associated with the hernia. Musculoskeletal: No significant skeletal lesion. IMPRESSION: No acute findings are evident in the abdomen or pelvis. There is unobstructed small bowel within an umbilical hernia. No inflammation or obstruction of bowel. Electronically  Signed   By: Andreas Newport M.D.   On: 02/04/2017 22:19    Procedures Procedures (including critical care time)  Medications Ordered in ED Medications  oxyCODONE-acetaminophen (PERCOCET/ROXICET) 5-325 MG per tablet 1 tablet (0 tablets Oral Not Given 02/04/17 1545)  ondansetron (ZOFRAN-ODT) disintegrating tablet 4 mg (4 mg Oral Given 02/04/17 1949)  ondansetron (ZOFRAN-ODT) 4 MG disintegrating tablet (4 mg  Given 02/04/17 1515)  sodium chloride 0.9 % bolus 1,000 mL (1,000 mLs Intravenous New Bag/Given 02/04/17 2056)  morphine 4 MG/ML injection 4 mg (4 mg Intravenous Given 02/04/17 2056)  haloperidol lactate (HALDOL) injection 2.5 mg (2.5 mg Intravenous Given 02/04/17 2056)  iopamidol (ISOVUE-300) 61 % injection (100 mLs  Contrast Given 02/04/17 2146)     Initial Impression / Assessment and Plan / ED Course  I have reviewed the triage vital signs and the nursing notes.  Pertinent labs & imaging results that were available during my care of the patient were reviewed by me and considered in my medical decision making (see chart for details).     Patient with probable gastroenteritis versus gastroparesis. CBC shows to be WBC 13.1. CMP shows stable renal function, BUN 23, creatinine 2.4. UA shows small hematuria, glucose, protein, but no infection. Lipase 67. CXR shows low lung volumes with right basilar atelectasis and/or scarring. Patient denies chest pain or shortness of breath. CT abdomen pelvis shows no acute findings.  Patient feeling much better after morphine, Haldol, Zofran, Reglan, fluids. We'll discharge home with Reglan, Tylenol. Follow-up and establish care with gastroenterologist for further evaluation of patient's previously diagnosed gastroparesis. Return precautions discussed. Patient tolerating PO prior to discharge. Patient also evaluated by Dr. Thomasene Lot who guided the patient's management invasive plan. Patient vitals stable throughout ED course and discharged in satisfactory condition.  Final Clinical Impressions(s) / ED Diagnoses   Final diagnoses:  Nausea vomiting and diarrhea  Generalized abdominal pain    New Prescriptions New Prescriptions   ACETAMINOPHEN (TYLENOL) 500 MG TABLET    Take 1 tablet (500 mg total) by mouth every 6 (six) hours as needed.   METOCLOPRAMIDE (REGLAN) 10 MG TABLET    Take 1 tablet (10 mg total) by mouth every 6 (six) hours.     Frederica Kuster, PA-C 02/05/17 1245    Macarthur Critchley, MD 02/19/17 [phone]

## 2017-02-04 NOTE — ED Notes (Signed)
Pt moaning, crying, dry heaving.  Pt reports chest tightness.  Pt's wife states, "He's been doing this for the past couple of years." Pt calmer with spouse now at side.

## 2017-02-04 NOTE — ED Triage Notes (Signed)
Arrived via EMS patient possibly ate and old steak and today developed nausea, emesis, and diarrhea with lower abdominal pain. CBG 195 with EMS

## 2017-02-04 NOTE — ED Notes (Signed)
Patient stated wanted oral pain medication instead of nasal spray while waiting.

## 2017-02-05 DIAGNOSIS — R1030 Lower abdominal pain, unspecified: Secondary | ICD-10-CM | POA: Diagnosis not present

## 2017-02-05 MED ORDER — METOCLOPRAMIDE HCL 10 MG PO TABS: 10.0000 mg | ORAL_TABLET | Freq: Four times a day (QID) | ORAL | 0 refills | Status: DC

## 2017-02-05 MED ORDER — ACETAMINOPHEN 500 MG PO TABS: 500.0000 mg | ORAL_TABLET | Freq: Four times a day (QID) | ORAL | 0 refills | Status: AC | PRN

## 2017-02-05 NOTE — Discharge Instructions (Signed)
Medications: Reglan, Tylenol  Treatment: Take Reglan every 6 hours as needed for nausea, vomiting, or abdominal pain. Take Tylenol every 6 hours as well for abdominal pain. Make sure to drink plenty of fluids.  Follow-up: Please follow-up with your doctor as soon as possible, as well as a gastroenterologist as outlined below for a gastric emptying study. Please return to emergency department if you develop any new or worsening symptoms including fever over 100.4, intractable vomiting, or any other new or concerning symptoms.

## 2017-02-21 NOTE — ED Provider Notes (Signed)
[facility] Provider Note   CSN: [id number] Arrival date & time: 01/05/17  1500     History   Chief Complaint Chief Complaint  Patient presents with  . Chest Pain    HPI Kenneth Fox is a 46 y.o. male.  Patient brought in by EMS. Complaint of chest tightness that started at 8:00 in the morning. Patient denied any nausea or vomiting. Patient is normally on continuous oxygen at home 2 L. EMS did give patient 324 mg of aspirin prior to arrival. Oxygen saturations were 98% on 3 L upon arrival. Patient states that chest pain is substernal. But also has pain from the leg up on the left side. Patient has a history of congestive heart failure. Is followed by cardiology. Denies shortness of breath. Patient has a history of diabetes. Primary care doctor is Dr. Laverta Baltimore. Patient also has a history of neuropathy.      Past Medical History:  Diagnosis Date  . Asthma   . CHF (congestive heart failure) ([place])   . Diabetes ([place])   . Gastroparesis   . GERD (gastroesophageal reflux disease)   . Hypertension   . Kidney disorder   . Neuropathy   . Obesity   . Obstructive sleep apnea    will not wear CPAP  . Renal insufficiency   . Sickle cell trait [facility])     Patient Active Problem List   Diagnosis Date Noted  . Acute on chronic respiratory failure with hypoxia and hypercapnia ([place]) 10/26/2016  . Uncontrolled hypertension 10/26/2016  . Fluid overload 10/13/2016  . Acute on chronic diastolic CHF (congestive heart failure) ([place]) 10/13/2016  . CHF (congestive heart failure) ([place]) 10/13/2016  . Acute kidney injury ([place]) 05/09/2016  . Dysphagia 12/12/2015  . Intractable nausea and vomiting 12/11/2015  . Leukocytosis 06/02/2015  . Morbid obesity due to excess calories ([place]) 06/02/2015  . Uncontrolled type 2 diabetes mellitus with diabetic neuropathy, with long-term current use of insulin ([place])   . Elevated troponin 06/01/2015  . AKI (acute kidney injury) ([place]) 12/03/2013  .  Adverse reaction to lisinopril 12/03/2013  . Hypokalemia 11/25/2013  . Diabetic gastroparesis ([place]) 11/17/2013  . Obstructive sleep apnea 11/07/2012  . Essential hypertension 05/18/2011    Past Surgical History:  Procedure Laterality Date  . HIP FRACTURE SURGERY Right 1999   "put a plate in"       Home Medications    Prior to Admission medications   Medication Sig Start Date End Date Taking? Authorizing Provider  hydrALAZINE (APRESOLINE) 50 MG tablet Take 1.5 tablets (75 mg total) by mouth 3 (three) times daily. 10/26/16  Yes Dhungel, Nishant, MD  acetaminophen (TYLENOL) 500 MG tablet Take 1 tablet (500 mg total) by mouth every 6 (six) hours as needed. 02/05/17   Law, Bea Graff, PA-C  blood glucose meter kit and supplies Dispense based on patient and insurance preference. Use up to four times daily as directed. (FOR ICD-9 250.00, 250.01). 05/11/16   Patrecia Pour, MD  cloNIDine (CATAPRES) 0.2 MG tablet Take 0.2 mg by mouth 2 (two) times daily. 12/11/16   [provider]  furosemide (LASIX) 80 MG tablet Take 1 tablet (80 mg total) by mouth 2 (two) times daily. 10/15/16 01/28/17  Aline August, MD  insulin aspart (NOVOLOG) 100 UNIT/ML injection Inject 10-20 Units into the skin 3 (three) times daily with meals. per sliding scale CBG 70 - 120: 0 units CBG 121 - 150: 2 units CBG 151 - 200: 3 units CBG  201 - 250: 5 units CBG 251 - 300: 8 units CBG 301 - 350: 11 units CBG 351 - 400: 15 units 06/06/15   Robbie Lis, MD  insulin detemir (LEVEMIR) 100 UNIT/ML injection Inject 0.8 mLs (80 Units total) into the skin every evening. 12/13/15   Rai, Vernelle Emerald, MD  methocarbamol (ROBAXIN) 500 MG tablet Take 1 tablet (500 mg total) by mouth 2 (two) times daily. 11/13/16   Charlann Lange, PA-C  metoCLOPramide (REGLAN) 10 MG tablet Take 1 tablet (10 mg total) by mouth every 6 (six) hours. 02/05/17   Law, Bea Graff, PA-C  metoprolol tartrate (LOPRESSOR) 25 MG tablet Take 1 tablet (25 mg total)  by mouth 2 (two) times daily. 10/26/16   Dhungel, Flonnie Overman, MD  Oxycodone HCl 10 MG TABS Take 10 mg by mouth every 6 (six) hours as needed for pain.    [provider]  polyethylene glycol (MIRALAX / GLYCOLAX) packet Take 17 g by mouth daily as needed for mild constipation. 01/28/17   Davonna Belling, MD  potassium chloride SA (K-DUR,KLOR-CON) 20 MEQ tablet Take 20 mEq by mouth daily.    [provider]  pregabalin (LYRICA) 150 MG capsule Take 150 mg by mouth 3 (three) times daily.    [provider]    Family History Family History  Problem Relation Age of Onset  . Sickle cell anemia Mother   . Heart attack Mother   . Diabetes Father   . Neuropathy Father   . Hypertension Father   . Aneurysm Father   . Kidney disease Brother   . Kidney disease Sister     Social History Social History  Substance Use Topics  . Smoking status: Never Smoker  . Smokeless tobacco: Never Used  . Alcohol use No     Allergies   Angiotensin receptor blockers and Lisinopril   Review of Systems Review of Systems  Constitutional: Negative for fever.  HENT: Negative for congestion.   Eyes: Negative for redness.  Respiratory: Positive for chest tightness. Negative for shortness of breath.   Cardiovascular: Positive for chest pain.  Gastrointestinal: Negative for abdominal pain, nausea and vomiting.  Genitourinary: Negative for dysuria.  Musculoskeletal: Positive for myalgias.  Skin: Negative for rash.  Neurological: Negative for headaches.  Psychiatric/Behavioral: Negative for confusion.     Physical Exam Updated Vital Signs BP (!) 161/91   Pulse 64   Temp 97.6 F (36.4 C) (Oral)   Resp 16   Ht 1.803 m (5' 11" )   Wt (!) 154.7 kg (341 lb)   SpO2 95%   BMI 47.56 kg/m   Physical Exam  Constitutional: He is oriented to person, place, and time. He appears well-developed and well-nourished. No distress.  HENT:  Head: Normocephalic and atraumatic.  Mouth/Throat:  Oropharynx is clear and moist.  Eyes: Pupils are equal, round, and reactive to light. Conjunctivae and EOM are normal.  Neck: Neck supple.  Cardiovascular: Normal rate, regular rhythm and normal heart sounds.   Pulmonary/Chest: Effort normal and breath sounds normal. No respiratory distress.  Abdominal: Soft. Bowel sounds are normal. There is no tenderness.  Musculoskeletal: Normal range of motion.  Neurological: He is alert and oriented to person, place, and time. No cranial nerve deficit. He exhibits normal muscle tone. Coordination normal.  Skin: Skin is warm.  Nursing note and vitals reviewed.    ED Treatments / Results  Labs (all labs ordered are listed, but only abnormal results are displayed) Labs Reviewed  CBC - Abnormal;  Notable for the following:       Result Value   Hemoglobin 12.9 (*)    All other components within normal limits  COMPREHENSIVE METABOLIC PANEL - Abnormal; Notable for the following:    Glucose, Bld 158 (*)    BUN 35 (*)    Creatinine, Ser 2.70 (*)    Calcium 8.6 (*)    Total Protein 6.4 (*)    Albumin 2.7 (*)    ALT 12 (*)    GFR calc non Af Amer 26 (*)    GFR calc Af Amer 30 (*)    All other components within normal limits  BRAIN NATRIURETIC PEPTIDE  I-STAT TROPONIN, ED  I-STAT TROPONIN, ED    EKG  EKG Interpretation  Date/Time:  Tuesday January 05 2017 15:03:14 EDT Ventricular Rate:  64 PR Interval:    QRS Duration: 103 QT Interval:  463 QTC Calculation: 478 R Axis:   -6 Text Interpretation:  Sinus rhythm Low voltage, precordial leads Consider anterior infarct No significant change since last tracing Confirmed by Fredia Sorrow [phone]) on 01/05/2017 3:37:25 PM      Results for orders placed or performed during the hospital encounter of 01/05/17  CBC  Result Value Ref Range   WBC 8.1 4.0 - 10.5 K/uL   RBC 4.74 4.22 - 5.81 MIL/uL   Hemoglobin 12.9 (L) 13.0 - 17.0 g/dL   HCT 39.3 39.0 - 52.0 %   MCV 82.9 78.0 - 100.0 fL   MCH 27.2  26.0 - 34.0 pg   MCHC 32.8 30.0 - 36.0 g/dL   RDW 14.7 11.5 - 15.5 %   Platelets 196 150 - 400 K/uL  Comprehensive metabolic panel  Result Value Ref Range   Sodium 139 135 - 145 mmol/L   Potassium 3.8 3.5 - 5.1 mmol/L   Chloride 102 101 - 111 mmol/L   CO2 28 22 - 32 mmol/L   Glucose, Bld 158 (H) 65 - 99 mg/dL   BUN 35 (H) 6 - 20 mg/dL   Creatinine, Ser 2.70 (H) 0.61 - 1.24 mg/dL   Calcium 8.6 (L) 8.9 - 10.3 mg/dL   Total Protein 6.4 (L) 6.5 - 8.1 g/dL   Albumin 2.7 (L) 3.5 - 5.0 g/dL   AST 18 15 - 41 U/L   ALT 12 (L) 17 - 63 U/L   Alkaline Phosphatase 81 38 - 126 U/L   Total Bilirubin 0.4 0.3 - 1.2 mg/dL   GFR calc non Af Amer 26 (L) >60 mL/min   GFR calc Af Amer 30 (L) >60 mL/min   Anion gap 9 5 - 15  Brain natriuretic peptide  Result Value Ref Range   B Natriuretic Peptide 44.9 0.0 - 100.0 pg/mL  I-stat troponin, ED  Result Value Ref Range   Troponin i, poc 0.00 0.00 - 0.08 ng/mL   Comment 3          I-stat troponin, ED  Result Value Ref Range   Troponin i, poc 0.00 0.00 - 0.08 ng/mL   Comment 3           Dg Chest 2 View  Result Date: 02/04/2017 CLINICAL DATA:  Chest tightness with ronchi. EXAM: CHEST  2 VIEW COMPARISON:  None. FINDINGS: Low lung volumes with platelike atelectasis and/or scarring at the right lung base. Stable cardiomegaly. Stable appearance of the mediastinum without definite aortic aneurysm. No acute osseous abnormality. IMPRESSION: Low lung volumes with right basilar atelectasis and/or scarring. Electronically Signed   By: Shanon Brow  Randel Pigg M.D.   On: 02/04/2017 22:17   Dg Chest 2 View  Result Date: 01/28/2017 CLINICAL DATA:  Productive cough.  Feet swelling. EXAM: CHEST  2 VIEW COMPARISON:  01/05/2017 FINDINGS: Mildly low lung volumes with streaky opacities, chronic at the right base. Cardiomegaly and chronic vascular pedicle widening. No edema, effusion, or air bronchogram. EKG leads create artifact over the chest. IMPRESSION: Low volume chest with  atelectasis and scarring. Electronically Signed   By: Monte Fantasia M.D.   On: 01/28/2017 10:08   Ct Abdomen Pelvis W Contrast  Result Date: 02/04/2017 CLINICAL DATA:  Nausea vomiting and diarrhea today. Lower abdominal pain. EXAM: CT ABDOMEN AND PELVIS WITH CONTRAST TECHNIQUE: Multidetector CT imaging of the abdomen and pelvis was performed using the standard protocol following bolus administration of intravenous contrast. CONTRAST:  159m ISOVUE-300 IOPAMIDOL (ISOVUE-300) INJECTION 61% COMPARISON:  12/11/2015 FINDINGS: Lower chest: Stable linear opacities in the lung bases, unchanged from 12/11/2015. Likely scarring. No acute findings in the lower chest. Hepatobiliary: No focal liver abnormality is seen. No gallstones, gallbladder wall thickening, or biliary dilatation. Pancreas: Unremarkable. No pancreatic ductal dilatation or surrounding inflammatory changes. Spleen: Normal in size without focal abnormality. Adrenals/Urinary Tract: Adrenal glands are unremarkable. Kidneys are normal, without renal calculi, focal lesion, or hydronephrosis. Bladder is unremarkable. Stomach/Bowel: Stomach is within normal limits. Appendix is normal. No evidence of bowel wall thickening, distention, or inflammatory changes. Vascular/Lymphatic: No significant vascular findings are present. No enlarged abdominal or pelvic lymph nodes. Reproductive: Unremarkable Other: Umbilical hernia containing unobstructed small bowel. No inflammation or fluid associated with the hernia. Musculoskeletal: No significant skeletal lesion. IMPRESSION: No acute findings are evident in the abdomen or pelvis. There is unobstructed small bowel within an umbilical hernia. No inflammation or obstruction of bowel. Electronically Signed   By: DAndreas NewportM.D.   On: 02/04/2017 22:19   Again chest x-ray is not crossing over. But chest x-ray was done July 31. Findings low volume chest with scarring or atelectasis at the right middle lobe cardiomegaly  without failure.  Radiology No results found.  See above  Procedures Procedures (including critical care time)  Medications Ordered in ED Medications - No data to display   Initial Impression / Assessment and Plan / ED Course  I have reviewed the triage vital signs and the nursing notes.  Pertinent labs & imaging results that were available during my care of the patient were reviewed by me and considered in my medical decision making (see chart for details).     History of renal insufficiency without any significant change. Chest pain workup to include 2 negative troponins without any acute findings. Some concern for anterior changes on EKG since the previous EKG but no acute STEMI changes.  Agent stable for discharge home and follow-up with cardiology. No evidence of pulmonary edema no evidence of acute cardiac event. No hypoxia change from baseline.  Final Clinical Impressions(s) / ED Diagnoses   Final diagnoses:  Precordial pain    New Prescriptions Discharge Medication List as of 01/05/2017  9:20 PM       ZFredia Sorrow MD 02/21/17 1521

## 2017-03-04 ENCOUNTER — Encounter (HOSPITAL_COMMUNITY): Payer: Self-pay | Admitting: Emergency Medicine

## 2017-03-04 ENCOUNTER — Inpatient Hospital Stay (HOSPITAL_COMMUNITY)
Admission: EM | Admit: 2017-03-04 | Discharge: 2017-03-07 | DRG: 682 | Disposition: A | Discharge status: Home / Self Care | Payer: Medicare Other | Attending: Nephrology | Admitting: Nephrology

## 2017-03-04 DIAGNOSIS — K3184 Gastroparesis: Secondary | ICD-10-CM | POA: Diagnosis present

## 2017-03-04 DIAGNOSIS — I959 Hypotension, unspecified: Secondary | ICD-10-CM | POA: Diagnosis not present

## 2017-03-04 DIAGNOSIS — E1122 Type 2 diabetes mellitus with diabetic chronic kidney disease: Secondary | ICD-10-CM | POA: Diagnosis present

## 2017-03-04 DIAGNOSIS — N189 Chronic kidney disease, unspecified: Secondary | ICD-10-CM | POA: Diagnosis not present

## 2017-03-04 DIAGNOSIS — J9611 Chronic respiratory failure with hypoxia: Secondary | ICD-10-CM | POA: Diagnosis present

## 2017-03-04 DIAGNOSIS — N170 Acute kidney failure with tubular necrosis: Principal | ICD-10-CM | POA: Diagnosis present

## 2017-03-04 DIAGNOSIS — E119 Type 2 diabetes mellitus without complications: Secondary | ICD-10-CM

## 2017-03-04 DIAGNOSIS — E1142 Type 2 diabetes mellitus with diabetic polyneuropathy: Secondary | ICD-10-CM | POA: Diagnosis present

## 2017-03-04 DIAGNOSIS — K219 Gastro-esophageal reflux disease without esophagitis: Secondary | ICD-10-CM | POA: Diagnosis present

## 2017-03-04 DIAGNOSIS — E1140 Type 2 diabetes mellitus with diabetic neuropathy, unspecified: Secondary | ICD-10-CM

## 2017-03-04 DIAGNOSIS — Z23 Encounter for immunization: Secondary | ICD-10-CM

## 2017-03-04 DIAGNOSIS — Z79899 Other long term (current) drug therapy: Secondary | ICD-10-CM

## 2017-03-04 DIAGNOSIS — IMO0002 Reserved for concepts with insufficient information to code with codable children: Secondary | ICD-10-CM

## 2017-03-04 DIAGNOSIS — R32 Unspecified urinary incontinence: Secondary | ICD-10-CM | POA: Diagnosis present

## 2017-03-04 DIAGNOSIS — D573 Sickle-cell trait: Secondary | ICD-10-CM | POA: Diagnosis present

## 2017-03-04 DIAGNOSIS — I5032 Chronic diastolic (congestive) heart failure: Secondary | ICD-10-CM | POA: Diagnosis present

## 2017-03-04 DIAGNOSIS — N179 Acute kidney failure, unspecified: Secondary | ICD-10-CM | POA: Diagnosis present

## 2017-03-04 DIAGNOSIS — Z794 Long term (current) use of insulin: Secondary | ICD-10-CM | POA: Diagnosis not present

## 2017-03-04 DIAGNOSIS — E860 Dehydration: Secondary | ICD-10-CM | POA: Diagnosis present

## 2017-03-04 DIAGNOSIS — E876 Hypokalemia: Secondary | ICD-10-CM

## 2017-03-04 DIAGNOSIS — Z9981 Dependence on supplemental oxygen: Secondary | ICD-10-CM | POA: Diagnosis not present

## 2017-03-04 DIAGNOSIS — Z6841 Body Mass Index (BMI) 40.0 and over, adult: Secondary | ICD-10-CM | POA: Diagnosis not present

## 2017-03-04 DIAGNOSIS — G4733 Obstructive sleep apnea (adult) (pediatric): Secondary | ICD-10-CM

## 2017-03-04 DIAGNOSIS — G9341 Metabolic encephalopathy: Secondary | ICD-10-CM | POA: Diagnosis present

## 2017-03-04 DIAGNOSIS — I10 Essential (primary) hypertension: Secondary | ICD-10-CM | POA: Diagnosis not present

## 2017-03-04 DIAGNOSIS — Z888 Allergy status to other drugs, medicaments and biological substances status: Secondary | ICD-10-CM

## 2017-03-04 DIAGNOSIS — E1143 Type 2 diabetes mellitus with diabetic autonomic (poly)neuropathy: Secondary | ICD-10-CM | POA: Diagnosis present

## 2017-03-04 DIAGNOSIS — N183 Chronic kidney disease, stage 3 (moderate): Secondary | ICD-10-CM | POA: Diagnosis present

## 2017-03-04 DIAGNOSIS — E1165 Type 2 diabetes mellitus with hyperglycemia: Secondary | ICD-10-CM

## 2017-03-04 DIAGNOSIS — Z9989 Dependence on other enabling machines and devices: Secondary | ICD-10-CM

## 2017-03-04 DIAGNOSIS — J45909 Unspecified asthma, uncomplicated: Secondary | ICD-10-CM | POA: Diagnosis present

## 2017-03-04 DIAGNOSIS — I130 Hypertensive heart and chronic kidney disease with heart failure and stage 1 through stage 4 chronic kidney disease, or unspecified chronic kidney disease: Secondary | ICD-10-CM | POA: Diagnosis present

## 2017-03-04 LAB — CBC WITH DIFFERENTIAL/PLATELET
Basophils Absolute: 0 10*3/uL (ref 0.0–0.1)
Basophils Relative: 0 %
Eosinophils Absolute: 0 10*3/uL (ref 0.0–0.7)
Eosinophils Relative: 0 %
HCT: 38.4 % — ABNORMAL LOW (ref 39.0–52.0)
Hemoglobin: 12.6 g/dL — ABNORMAL LOW (ref 13.0–17.0)
Lymphocytes Relative: 17 %
Lymphs Abs: 1.6 10*3/uL (ref 0.7–4.0)
MCH: 27.5 pg (ref 26.0–34.0)
MCHC: 32.8 g/dL (ref 30.0–36.0)
MCV: 83.7 fL (ref 78.0–100.0)
Monocytes Absolute: 0.9 10*3/uL (ref 0.1–1.0)
Monocytes Relative: 10 %
Neutro Abs: 6.7 10*3/uL (ref 1.7–7.7)
Neutrophils Relative %: 73 %
Platelets: 181 10*3/uL (ref 150–400)
RBC: 4.59 MIL/uL (ref 4.22–5.81)
RDW: 14.7 % (ref 11.5–15.5)
WBC: 9.2 10*3/uL (ref 4.0–10.5)

## 2017-03-04 LAB — URINALYSIS, ROUTINE W REFLEX MICROSCOPIC
Bilirubin Urine: NEGATIVE
Glucose, UA: NEGATIVE mg/dL
KETONES UR: NEGATIVE mg/dL
Leukocytes, UA: NEGATIVE
Nitrite: NEGATIVE
PH: 5 (ref 5.0–8.0)
Protein, ur: >=300 mg/dL — AB
Specific Gravity, Urine: 1.012 (ref 1.005–1.030)

## 2017-03-04 LAB — BASIC METABOLIC PANEL
Anion gap: 13 (ref 5–15)
BUN: 69 mg/dL — ABNORMAL HIGH (ref 6–20)
CO2: 30 mmol/L (ref 22–32)
Calcium: 7.8 mg/dL — ABNORMAL LOW (ref 8.9–10.3)
Chloride: 93 mmol/L — ABNORMAL LOW (ref 101–111)
Creatinine, Ser: 4.27 mg/dL — ABNORMAL HIGH (ref 0.61–1.24)
GFR calc Af Amer: 17 mL/min — ABNORMAL LOW (ref >60)
GFR calc non Af Amer: 15 mL/min — ABNORMAL LOW (ref >60)
Glucose, Bld: 123 mg/dL — ABNORMAL HIGH (ref 65–99)
Potassium: 2.5 mmol/L — CL (ref 3.5–5.1)
Sodium: 136 mmol/L (ref 135–145)

## 2017-03-04 LAB — CBG MONITORING, ED: Glucose-Capillary: 130 mg/dL — ABNORMAL HIGH (ref 65–99)

## 2017-03-04 LAB — GLUCOSE, CAPILLARY: GLUCOSE-CAPILLARY: 147 mg/dL — AB (ref 65–99)

## 2017-03-04 LAB — MAGNESIUM: MAGNESIUM: 1.7 mg/dL (ref 1.7–2.4)

## 2017-03-04 MED ORDER — HEPARIN SODIUM (PORCINE) 5000 UNIT/ML IJ SOLN
5000.0000 [IU] | Freq: Three times a day (TID) | INTRAMUSCULAR | Status: DC
  Administered 2017-03-04 – 2017-03-06 (×7): 5000 [IU] via SUBCUTANEOUS
  Filled 2017-03-04 (×8): qty 1

## 2017-03-04 MED ORDER — POLYETHYLENE GLYCOL 3350 17 G PO PACK: 17.0000 g | PACK | Freq: Every day | ORAL | Status: DC | PRN

## 2017-03-04 MED ORDER — SODIUM CHLORIDE 0.9 % IV BOLUS (SEPSIS)
500.0000 mL | Freq: Once | INTRAVENOUS | Status: AC
  Administered 2017-03-04: 500 mL via INTRAVENOUS

## 2017-03-04 MED ORDER — METOCLOPRAMIDE HCL 10 MG PO TABS
10.0000 mg | ORAL_TABLET | Freq: Three times a day (TID) | ORAL | Status: DC | PRN
  Administered 2017-03-05: 10 mg via ORAL
  Filled 2017-03-04: qty 1

## 2017-03-04 MED ORDER — PREGABALIN 100 MG PO CAPS
100.0000 mg | ORAL_CAPSULE | Freq: Every day | ORAL | Status: DC
  Administered 2017-03-04 – 2017-03-06 (×3): 100 mg via ORAL
  Filled 2017-03-04 (×3): qty 1

## 2017-03-04 MED ORDER — POTASSIUM CHLORIDE CRYS ER 20 MEQ PO TBCR: 40.0000 meq | EXTENDED_RELEASE_TABLET | Freq: Two times a day (BID) | ORAL | Status: DC

## 2017-03-04 MED ORDER — IPRATROPIUM-ALBUTEROL 0.5-2.5 (3) MG/3ML IN SOLN: 3.0000 mL | RESPIRATORY_TRACT | Status: DC | PRN

## 2017-03-04 MED ORDER — INSULIN DETEMIR 100 UNIT/ML ~~LOC~~ SOLN
80.0000 [IU] | Freq: Every day | SUBCUTANEOUS | Status: DC
  Administered 2017-03-04 – 2017-03-06 (×3): 80 [IU] via SUBCUTANEOUS
  Filled 2017-03-04 (×3): qty 0.8

## 2017-03-04 MED ORDER — INSULIN ASPART 100 UNIT/ML ~~LOC~~ SOLN
0.0000 [IU] | Freq: Three times a day (TID) | SUBCUTANEOUS | Status: DC
  Administered 2017-03-05 (×2): 2 [IU] via SUBCUTANEOUS
  Administered 2017-03-05: 3 [IU] via SUBCUTANEOUS
  Administered 2017-03-06: 2 [IU] via SUBCUTANEOUS
  Administered 2017-03-06: 1 [IU] via SUBCUTANEOUS

## 2017-03-04 MED ORDER — HYDRALAZINE HCL 25 MG PO TABS
75.0000 mg | ORAL_TABLET | Freq: Three times a day (TID) | ORAL | Status: DC
  Administered 2017-03-04 – 2017-03-05 (×2): 75 mg via ORAL
  Filled 2017-03-04 (×2): qty 3

## 2017-03-04 MED ORDER — ONDANSETRON HCL 4 MG PO TABS: 4.0000 mg | ORAL_TABLET | Freq: Four times a day (QID) | ORAL | Status: DC | PRN

## 2017-03-04 MED ORDER — PREGABALIN 100 MG PO CAPS: 150.0000 mg | ORAL_CAPSULE | Freq: Three times a day (TID) | ORAL | Status: DC

## 2017-03-04 MED ORDER — POTASSIUM CHLORIDE CRYS ER 20 MEQ PO TBCR
40.0000 meq | EXTENDED_RELEASE_TABLET | Freq: Three times a day (TID) | ORAL | Status: DC
  Administered 2017-03-04 – 2017-03-06 (×5): 40 meq via ORAL
  Filled 2017-03-04 (×5): qty 2

## 2017-03-04 MED ORDER — METOPROLOL TARTRATE 25 MG PO TABS
25.0000 mg | ORAL_TABLET | Freq: Two times a day (BID) | ORAL | Status: DC
Start: 2017-03-04 — End: 2017-03-07
  Administered 2017-03-04 – 2017-03-07 (×6): 25 mg via ORAL
  Filled 2017-03-04 (×6): qty 1

## 2017-03-04 MED ORDER — METHOCARBAMOL 500 MG PO TABS
500.0000 mg | ORAL_TABLET | Freq: Two times a day (BID) | ORAL | Status: DC
  Administered 2017-03-04 – 2017-03-06 (×4): 500 mg via ORAL
  Filled 2017-03-04 (×6): qty 1

## 2017-03-04 MED ORDER — CLONIDINE HCL 0.2 MG PO TABS
0.2000 mg | ORAL_TABLET | Freq: Two times a day (BID) | ORAL | Status: DC
  Administered 2017-03-04 – 2017-03-05 (×2): 0.2 mg via ORAL
  Filled 2017-03-04 (×2): qty 1

## 2017-03-04 MED ORDER — ONDANSETRON HCL 4 MG/2ML IJ SOLN: 4.0000 mg | Freq: Four times a day (QID) | INTRAMUSCULAR | Status: DC | PRN

## 2017-03-04 MED ORDER — OXYCODONE HCL 5 MG PO TABS: 10.0000 mg | ORAL_TABLET | Freq: Four times a day (QID) | ORAL | Status: DC | PRN

## 2017-03-04 MED ORDER — POTASSIUM CHLORIDE IN NACL 40-0.9 MEQ/L-% IV SOLN
INTRAVENOUS | Status: AC
  Administered 2017-03-04: 100 mL/h via INTRAVENOUS
  Filled 2017-03-04 (×2): qty 1000

## 2017-03-04 MED ORDER — POTASSIUM CHLORIDE 10 MEQ/100ML IV SOLN
10.0000 meq | Freq: Once | INTRAVENOUS | Status: AC
  Administered 2017-03-04: 10 meq via INTRAVENOUS
  Filled 2017-03-04: qty 100

## 2017-03-04 NOTE — Progress Notes (Signed)
Patient resting well on CPAP 4 cmH2O (home setting) and 3L O2.  Humidity added and CPAP plugged into red outlet.

## 2017-03-04 NOTE — ED Notes (Signed)
Admitting at bedside speaking to wife at this time

## 2017-03-04 NOTE — ED Triage Notes (Signed)
Pt from home via EMS with complaints of right flank pain, urinary frequency, dysuria, diarrhea, x5days. Pt reports hx of same symptoms with past renal failure. Pt wears 3LPM [place] at all times. A+Ox4. NAD. VSS.

## 2017-03-04 NOTE — ED Notes (Signed)
Pt reports home health nurse advised him to come to ED per MD order

## 2017-03-04 NOTE — ED Notes (Signed)
Pt ambulated to restroom, gait steady, used walker for ambulation.  Able to toilet independently.

## 2017-03-04 NOTE — ED Notes (Signed)
Pt aware of need for urine sample.  

## 2017-03-04 NOTE — H&P (Signed)
History and Physical    Kenneth Fox QIH:[id number] DOB: Sep 19, 1970 DOA: 03/04/2017  PCP: Shanon Rosser, PA-C  Patient coming from:Home  Chief Complaint: Generalized weakness and decreased oral intake  HPI: Kenneth Fox is a 46 y.o. male with medical history significant of morbid obesity, chronic diastolic congestive heart failure, OSA on CPAP, chronic respiratory failure with hypoxia on 3 L of oxygen at home, uncontrolled type 2 diabetes with neuropathy, chronic kidney disease stage III with baseline serum creatinine level of 2.5, multiple prior admission for congestive heart failure presented with generalized weakness and decreased by mouth intake. Patient and his wife in the ER reported that patient was seen by his nephrologist Dr. Moshe Cipro about 3 weeks ago when patient was added on metolazone for the management of lower extremity edema. Patient was already on Lasix. After initiation of metolazone patient has increased urine output with intermittent incontinence. Lower extremity swelling improved. Patient was found to have worsening weakness for last 4 days with decreased oral intake. Patient also has problem with CPAP machine for about 2-3 weeks. Denied headache, dizziness, chest pain. No change in his shortness of breath. Has nausea but no vomiting. No abdominal pain. No diarrhea or constipation.  Patient lives with his wife. Denies smoking, alcohol or illicit drug use. Unemployed.  ED Course: Patient was found to have serum potassium of 2.5, increase in serum creatinine level to 4.2 from 2.4 on August 30. Plan to administer potassium in the ER. [facility] was consulted for further management.  Review of Systems: As per HPI otherwise 10 point review of systems negative.    Past Medical History:  Diagnosis Date  . Asthma   . CHF (congestive heart failure) ([place])   . Diabetes ([place])   . Gastroparesis   . GERD (gastroesophageal reflux disease)   . Hypertension   . Kidney disorder   .  Neuropathy   . Obesity   . Obstructive sleep apnea    will not wear CPAP  . Renal insufficiency   . Sickle cell trait [facility])     Past Surgical History:  Procedure Laterality Date  . HIP FRACTURE SURGERY Right 1999   "put a plate in"    Social history: reports that he has never smoked. He has never used smokeless tobacco. He reports that he does not drink alcohol or use drugs.  Allergies  Allergen Reactions  . Angiotensin Receptor Blockers Other (See Comments)    Acute renal failure in patient w R heart failure  . Lisinopril Other (See Comments)    Patient developed AKI after being on lisinopril for a week.    Family History  Problem Relation Age of Onset  . Sickle cell anemia Mother   . Heart attack Mother   . Diabetes Father   . Neuropathy Father   . Hypertension Father   . Aneurysm Father   . Kidney disease Brother   . Kidney disease Sister      Prior to Admission medications   Medication Sig Start Date End Date Taking? Authorizing Provider  acetaminophen (TYLENOL) 500 MG tablet Take 1 tablet (500 mg total) by mouth every 6 (six) hours as needed. 02/05/17   Law, Bea Graff, PA-C  blood glucose meter kit and supplies Dispense based on patient and insurance preference. Use up to four times daily as directed. (FOR ICD-9 250.00, 250.01). 05/11/16   Patrecia Pour, MD  cloNIDine (CATAPRES) 0.2 MG tablet Take 0.2 mg by mouth 2 (two) times daily. 12/11/16  [provider]  furosemide (LASIX) 80 MG tablet Take 1 tablet (80 mg total) by mouth 2 (two) times daily. 10/15/16 01/28/17  Aline August, MD  hydrALAZINE (APRESOLINE) 50 MG tablet Take 1.5 tablets (75 mg total) by mouth 3 (three) times daily. 10/26/16   Dhungel, Nishant, MD  insulin aspart (NOVOLOG) 100 UNIT/ML injection Inject 10-20 Units into the skin 3 (three) times daily with meals. per sliding scale CBG 70 - 120: 0 units CBG 121 - 150: 2 units CBG 151 - 200: 3 units CBG 201 - 250: 5 units CBG 251 - 300: 8  units CBG 301 - 350: 11 units CBG 351 - 400: 15 units 06/06/15   Robbie Lis, MD  insulin detemir (LEVEMIR) 100 UNIT/ML injection Inject 0.8 mLs (80 Units total) into the skin every evening. 12/13/15   Rai, Vernelle Emerald, MD  methocarbamol (ROBAXIN) 500 MG tablet Take 1 tablet (500 mg total) by mouth 2 (two) times daily. 11/13/16   Charlann Lange, PA-C  metoCLOPramide (REGLAN) 10 MG tablet Take 1 tablet (10 mg total) by mouth every 6 (six) hours. 02/05/17   Law, Bea Graff, PA-C  metoprolol tartrate (LOPRESSOR) 25 MG tablet Take 1 tablet (25 mg total) by mouth 2 (two) times daily. 10/26/16   Dhungel, Flonnie Overman, MD  Oxycodone HCl 10 MG TABS Take 10 mg by mouth every 6 (six) hours as needed for pain.    [provider]  polyethylene glycol (MIRALAX / GLYCOLAX) packet Take 17 g by mouth daily as needed for mild constipation. 01/28/17   Davonna Belling, MD  potassium chloride SA (K-DUR,KLOR-CON) 20 MEQ tablet Take 20 mEq by mouth daily.    [provider]  pregabalin (LYRICA) 150 MG capsule Take 150 mg by mouth 3 (three) times daily.    [provider]    Physical Exam: Vitals:   03/04/17 1502 03/04/17 1503 03/04/17 1507 03/04/17 1522  BP: (!) 86/61   (!) 111/54  Pulse: 80     Resp: 18     Temp: 98.9 F (37.2 C)     TempSrc: Oral     SpO2: 92% 96%    Weight:   (!) 154.2 kg (340 lb)       Constitutional: Obese male, sitting on bed, not in distress Vitals:   03/04/17 1502 03/04/17 1503 03/04/17 1507 03/04/17 1522  BP: (!) 86/61   (!) 111/54  Pulse: 80     Resp: 18     Temp: 98.9 F (37.2 C)     TempSrc: Oral     SpO2: 92% 96%    Weight:   (!) 154.2 kg (340 lb)    Eyes: PERRL, lids and conjunctivae normal ENMT: Mucous membranes are dry. Normal dentition.  Neck: normal, supple Respiratory: Bibasal decreased because of body habitus,nowheezingorcrackle Cardiovascular: Regular rate and rhythm, S1S2 Normal. no murmurs / rubs / gallops. No extremity edema.     Abdomen: Soft, Non-tender, chronic distention because of obesity. Bowel sounds positive.  Musculoskeletal: no clubbing / cyanosis. No joint deformity upper and lower extremities.  Skin: no rashes, lesions, ulcers. No induration Neurologic: CN 2-12 grossly intact. Sensation intact.  Strength 5/5 in all 4.  Psychiatric: Normal judgment and insight. Alert and oriented x 3. Normal mood.    Labs on Admission: I have personally reviewed following labs and imaging studies  CBC:  Recent Labs Lab 03/04/17 1553  WBC 9.2  NEUTROABS 6.7  HGB 12.6*  HCT 38.4*  MCV 83.7  PLT 181  Basic Metabolic Panel:  Recent Labs Lab 03/04/17 1553  NA 136  K 2.5*  CL 93*  CO2 30  GLUCOSE 123*  BUN 69*  CREATININE 4.27*  CALCIUM 7.8*   GFR: Estimated Creatinine Clearance: 32.8 mL/min (A) (by C-G formula based on SCr of 4.27 mg/dL (H)). Liver Function Tests: No results for input(s): AST, ALT, ALKPHOS, BILITOT, PROT, ALBUMIN in the last 168 hours. No results for input(s): LIPASE, AMYLASE in the last 168 hours. No results for input(s): AMMONIA in the last 168 hours. Coagulation Profile: No results for input(s): INR, PROTIME in the last 168 hours. Cardiac Enzymes: No results for input(s): CKTOTAL, CKMB, CKMBINDEX, TROPONINI in the last 168 hours. BNP (last 3 results) No results for input(s): PROBNP in the last 8760 hours. HbA1C: No results for input(s): HGBA1C in the last 72 hours. CBG:  Recent Labs Lab 03/04/17 1508  GLUCAP 130*   Lipid Profile: No results for input(s): CHOL, HDL, LDLCALC, TRIG, CHOLHDL, LDLDIRECT in the last 72 hours. Thyroid Function Tests: No results for input(s): TSH, T4TOTAL, FREET4, T3FREE, THYROIDAB in the last 72 hours. Anemia Panel: No results for input(s): VITAMINB12, FOLATE, FERRITIN, TIBC, IRON, RETICCTPCT in the last 72 hours. Urine analysis:    Component Value Date/Time   COLORURINE STRAW (A) 02/04/2017 2233   APPEARANCEUR CLEAR 02/04/2017 2233    LABSPEC 1.013 02/04/2017 2233   PHURINE 6.0 02/04/2017 2233   GLUCOSEU >=500 (A) 02/04/2017 2233   HGBUR SMALL (A) 02/04/2017 2233   BILIRUBINUR NEGATIVE 02/04/2017 2233   KETONESUR 5 (A) 02/04/2017 2233   PROTEINUR >=300 (A) 02/04/2017 2233   UROBILINOGEN 1.0 01/17/2015 2230   NITRITE NEGATIVE 02/04/2017 2233   LEUKOCYTESUR NEGATIVE 02/04/2017 2233   Sepsis Labs: !!!!!!!!!!!!!!!!!!!!!!!!!!!!!!!!!!!!!!!!!!!! @LABRCNTIP (procalcitonin:4,lacticidven:4) )No results found for this or any previous visit (from the past 240 hour(s)).   Radiological Exams on Admission: No results found.  EKG: Independently reviewed. Sinus rhythm with prolonged QT interval.  Assessment/Plan Active Problems:   Essential hypertension   Obstructive sleep apnea   Hypokalemia   Uncontrolled type 2 diabetes mellitus with diabetic neuropathy, with long-term current use of insulin ([place])   Morbid obesity due to excess calories ([place])   Acute kidney injury superimposed on CKD ([place])  # Acute kidney injury on chronic kidney disease stage III likely hemodynamically mediated in the setting of Lasix and recent admission of metolazone: -Patient has no lower extremity edema and has dry mucous present. Plan to hold diuretics tonight. Plan to start gentle IV hydration for 12 hours only. Will assess volume status in the morning. -Check UA -Strict in's and out -Repeat BMP in the morning -Avoid nephrotoxins -Consider nephro consult in the morning  #Severe hypokalemia in the setting of diuretics and decreased oral intake: Replete IV and oral potassium chloride. Check magnesium level. Monitor lab. Encourage oral intake.  #Chronic diastolic congestive heart failure: Patient is euvolemic and has no lower extremity edema. Currently diuretics on hold because of renal failure. -Continue hydralazine, metoprolol  #Diabetes on chronic insulin with neuropathy and gastroparesis: Check A1c level. Continue current insulin regimen.  Monitor blood sugar level. Carb modified diet. -Lower the dose of pregabalin -Recommend for the management of diabetic gastroparesis  # OSA/ chronic respiratory failure with hypoxia: -Continue CPAP. Case manager consulted for the evaluation of problem with CPAP at home. Continue 3 L of oxygen. DuoNeb as needed.  # Chronic metabolic encephalopathy, generalized weakness: Due to electrolytic imbalance, renal failure, CHF. -Mentally status is around baseline. -PT, OT and case  manager evaluation -Dietary consult  #Essential hypertension: Continue clonidine, hydralazine, metoprolol. Monitor heart rate and blood pressure.  #Morbid obesity: Dietary consult  DVT prophylaxis: Heparin subcutaneous Code Status: Full code Family Communication: Discussed with the patient's wife at bedside at length in ER Disposition Plan: Admit to telemetry floor Consults called: None Admission status: Inpatient   Rosela Supak Tanna Furry MD [facility] Pager [phone]  If 7PM-7AM, please contact night-coverage [email or web address] Password [facility]  03/04/2017, 6:35 PM

## 2017-03-04 NOTE — ED Provider Notes (Signed)
[facility]-EMERGENCY DEPT Provider Note   CSN: [id number] Arrival date & time: 03/04/17  1500     History   Chief Complaint Chief Complaint  Patient presents with  . Flank Pain  . Dysuria    HPI Kenneth Fox is a 46 y.o. male.  HPI   46 year old  male that is a poor historian with a history of chronic diastolic heart failure, CKD III ( baseline 1.8), chronic respiratory failure on 3 L at home, obstructive sleep apnea, uncontrolled type 2 diabetes with neuropathy presents today with complaints of flank pain and increased urinary frequency.  Patient notes that he has been seen by Dr. Lacy Duverney of nephrology.  He notes approximately 2 weeks ago he had an increase in his Lasix to 80 mg twice daily with a "booster" (starts with M ?? Metolazone).  He notes over the last week he has had increased urination, generalized fatigue.  He notes this is gotten to the point where he is unable to ambulate without overwhelming generalized fatigue.  Patient denies any acute shortness of breath or chest pain.  He notes that he was directed by his primary care provider to be evaluated in the emergency room for acute renal failure.  Patient notes this is similar to previous episodes of renal failure.  Patient's most recent echo was performed on 10/22/2016 with an LVEF of 65-70%   Past Medical History:  Diagnosis Date  . Asthma   . CHF (congestive heart failure) ([facility])   . Diabetes ([facility])   . Gastroparesis   . GERD (gastroesophageal reflux disease)   . Hypertension   . Kidney disorder   . Neuropathy   . Obesity   . Obstructive sleep apnea    will not wear CPAP  . Renal insufficiency   . Sickle cell trait [name])     Patient Active Problem List   Diagnosis Date Noted  . Acute kidney injury superimposed on CKD ([facility]) 03/04/2017  . Acute on chronic respiratory failure with hypoxia and hypercapnia ([facility]) 10/26/2016  . Uncontrolled hypertension 10/26/2016  . Fluid overload 10/13/2016  . Acute on chronic  diastolic CHF (congestive heart failure) (H[facility]) 10/13/2016  . CHF (congestive heart failure) (H[facility]) 10/13/2016  . Acute kidney injury ([facility]) 05/09/2016  . Dysphagia 12/12/2015  . Intractable nausea and vomiting 12/11/2015  . Leukocytosis 06/02/2015  . Morbid obesity due to excess calories ([facility]) 06/02/2015  . Uncontrolled type 2 diabetes mellitus with diabetic neuropathy, with long-term current use of insulin (H[facility])   . Elevated troponin 06/01/2015  . AKI (acute kidney injury) (H[facility]) 12/03/2013  . Adverse reaction to lisinopril 12/03/2013  . Hypokalemia 11/25/2013  . Diabetic gastroparesis (HCC) 11/17/2013  . Obstructive sleep apnea 11/07/2012  . Essential hypertension 05/18/2011    Past Surgical History:  Procedure Laterality Date  . HIP FRACTURE SURGERY Right 1999   "put a plate in"     Home Medications    Prior to Admission medications   Medication Sig Start Date End Date Taking? Authorizing Provider  acetaminophen (TYLENOL) 500 MG tablet Take 1 tablet (500 mg total) by mouth every 6 (six) hours as needed. 02/05/17  Yes Law, Waylan Boga, PA-C  cloNIDine (CATAPRES) 0.2 MG tablet Take 0.2 mg by mouth 2 (two) times daily. 12/11/16  Yes [provider]  furosemide (LASIX) 80 MG tablet Take 1 tablet (80 mg total) by mouth 2 (two) times daily. 10/15/16 03/04/17 Yes Glade Lloyd, MD  hydrALAZINE (APRESOLINE) 50 MG tablet Take 1.5 tablets (75 mg total)  by mouth 3 (three) times daily. Patient taking differently: Take 50 mg by mouth 3 (three) times daily.  10/26/16  Yes Dhungel, Nishant, MD  insulin aspart (NOVOLOG) 100 UNIT/ML injection Inject 10-20 Units into the skin 3 (three) times daily with meals. per sliding scale CBG 70 - 120: 0 units CBG 121 - 150: 2 units CBG 151 - 200: 3 units CBG 201 - 250: 5 units CBG 251 - 300: 8 units CBG 301 - 350: 11 units CBG 351 - 400: 15 units 06/06/15  Yes Alison Murray, MD  insulin detemir (LEVEMIR) 100 UNIT/ML injection Inject 0.8 mLs (80  Units total) into the skin every evening. 12/13/15  Yes Rai, Ripudeep K, MD  methocarbamol (ROBAXIN) 500 MG tablet Take 1 tablet (500 mg total) by mouth 2 (two) times daily. 11/13/16  Yes Upstill, Melvenia Beam, PA-C  metoCLOPramide (REGLAN) 10 MG tablet Take 1 tablet (10 mg total) by mouth every 6 (six) hours. Patient taking differently: Take 10 mg by mouth as needed.  02/05/17  Yes Law, Waylan Boga, PA-C  metolazone (ZAROXOLYN) 5 MG tablet Take 5 mg by mouth 2 (two) times a week. 01/29/17  Yes [provider]  metoprolol tartrate (LOPRESSOR) 25 MG tablet Take 1 tablet (25 mg total) by mouth 2 (two) times daily. 10/26/16  Yes Dhungel, Nishant, MD  Oxycodone HCl 10 MG TABS Take 10 mg by mouth every 6 (six) hours as needed for pain.   Yes [provider]  OXYGEN Inhale 2 L into the lungs continuous.   Yes [provider]  polyethylene glycol (MIRALAX / GLYCOLAX) packet Take 17 g by mouth daily as needed for mild constipation. 01/28/17  Yes Benjiman Core, MD  potassium chloride SA (K-DUR,KLOR-CON) 20 MEQ tablet Take 20 mEq by mouth daily.   Yes [provider]  pregabalin (LYRICA) 150 MG capsule Take 150 mg by mouth 3 (three) times daily.   Yes [provider]    Family History Family History  Problem Relation Age of Onset  . Sickle cell anemia Mother   . Heart attack Mother   . Diabetes Father   . Neuropathy Father   . Hypertension Father   . Aneurysm Father   . Kidney disease Brother   . Kidney disease Sister     Social History Social History  Substance Use Topics  . Smoking status: Never Smoker  . Smokeless tobacco: Never Used  . Alcohol use No     Allergies   Angiotensin receptor blockers and Lisinopril   Review of Systems Review of Systems  All other systems reviewed and are negative.   Physical Exam Updated Vital Signs BP 112/73   Pulse 69   Temp 98.9 F (37.2 C) (Oral)   Resp 15   Wt (!) 154.2 kg (340 lb)   SpO2 99%   BMI  44.86 kg/m    Physical Exam  Constitutional: He is oriented to person, place, and time. He appears well-developed and well-nourished.  HENT:  Head: Normocephalic and atraumatic.  Eyes: Pupils are equal, round, and reactive to light. Conjunctivae are normal. Right eye exhibits no discharge. Left eye exhibits no discharge. No scleral icterus.  Neck: Normal range of motion. No JVD present. No tracheal deviation present.  Pulmonary/Chest: Effort normal. No stridor.  Neurological: He is alert and oriented to person, place, and time. Coordination normal.  Psychiatric: He has a normal mood and affect. His behavior is normal. Judgment and thought content normal.  Nursing note and vitals  reviewed.   ED Treatments / Results  Labs (all labs ordered are listed, but only abnormal results are displayed) Labs Reviewed  URINALYSIS, ROUTINE W REFLEX MICROSCOPIC - Abnormal; Notable for the following:       Result Value   APPearance HAZY (*)    Hgb urine dipstick SMALL (*)    Protein, ur >=300 (*)    Bacteria, UA RARE (*)    Squamous Epithelial / LPF 0-5 (*)    All other components within normal limits  CBC WITH DIFFERENTIAL/PLATELET - Abnormal; Notable for the following:    Hemoglobin 12.6 (*)    HCT 38.4 (*)    All other components within normal limits  BASIC METABOLIC PANEL - Abnormal; Notable for the following:    Potassium 2.5 (*)    Chloride 93 (*)    Glucose, Bld 123 (*)    BUN 69 (*)    Creatinine, Ser 4.27 (*)    Calcium 7.8 (*)    GFR calc non Af Amer 15 (*)    GFR calc Af Amer 17 (*)    All other components within normal limits  CBG MONITORING, ED - Abnormal; Notable for the following:    Glucose-Capillary 130 (*)    All other components within normal limits  BASIC METABOLIC PANEL  CBC  MAGNESIUM  MAGNESIUM  HEMOGLOBIN A1C    EKG  EKG Interpretation  Date/Time:  Thursday March 04 2017 16:43:46 EDT Ventricular Rate:  75 PR Interval:    QRS Duration: 91 QT  Interval:  548 QTC Calculation: 613 R Axis:   133 Text Interpretation:  Sinus rhythm Low voltage with right axis deviation Abnormal R-wave progression, late transition Prolonged QT interval Baseline wander in lead(s) V6 Confirmed by Pricilla Loveless [phone]) on 03/04/2017 4:46:59 PM       Radiology No results found.  Procedures Procedures (including critical care time)  Medications Ordered in ED Medications  0.9 % NaCl with KCl 40 mEq / L  infusion (not administered)  metoCLOPramide (REGLAN) tablet 10 mg (not administered)  cloNIDine (CATAPRES) tablet 0.2 mg (not administered)  Oxycodone HCl TABS 10 mg (not administered)  polyethylene glycol (MIRALAX / GLYCOLAX) packet 17 g (not administered)  methocarbamol (ROBAXIN) tablet 500 mg (not administered)  hydrALAZINE (APRESOLINE) tablet 75 mg (not administered)  metoprolol tartrate (LOPRESSOR) tablet 25 mg (not administered)  insulin detemir (LEVEMIR) injection 80 Units (not administered)  heparin injection 5,000 Units (not administered)  ondansetron (ZOFRAN) tablet 4 mg (not administered)    Or  ondansetron (ZOFRAN) injection 4 mg (not administered)  pregabalin (LYRICA) capsule 100 mg (not administered)  insulin aspart (novoLOG) injection 0-9 Units (not administered)  ipratropium-albuterol (DUONEB) 0.5-2.5 (3) MG/3ML nebulizer solution 3 mL (not administered)  potassium chloride SA (K-DUR,KLOR-CON) CR tablet 40 mEq (not administered)  potassium chloride 10 mEq in 100 mL IVPB (10 mEq Intravenous Restarted 03/04/17 2004)  sodium chloride 0.9 % bolus 500 mL (500 mLs Intravenous Restarted 03/04/17 2004)     Initial Impression / Assessment and Plan / ED Course  I have reviewed the triage vital signs and the nursing notes.  Pertinent labs & imaging results that were available during my care of the patient were reviewed by me and considered in my medical decision making (see chart for details).     Final Clinical Impressions(s) / ED  Diagnoses   Final diagnoses:  AKI (acute kidney injury) (HCC)  Hypokalemia    Labs: UA, CBC, BMP  Imaging:  Consults: [facility]  Therapeutics:  Calcium, normal saline  Discharge Meds:   Assessment/Plan: 46 year old male presents today with a KI superimposed on CKD, hyperkalemia.  This is likely secondary to diuretics and decreased p.o. intake.  Patient will require hospital admission for ongoing evaluation and management.  He has no other acute findings here that would necessitate further evaluation or management in the ED.      New Prescriptions New Prescriptions   No medications on file     Rosalio Loud 03/04/17 2013    Gerhard Munch, MD 03/05/17 [phone]

## 2017-03-04 NOTE — ED Notes (Signed)
Pt asking for more food.  This RN made him aware it was too late for cafeteria, but I could provide him with another [place] sandwich.  Patient refused and states he will "order myself food when I get to my room".

## 2017-03-04 NOTE — ED Notes (Signed)
Pt placed on 2L [place].  States he wears it all the time at home.  SpO2 WDL during stay without O2

## 2017-03-05 ENCOUNTER — Encounter (HOSPITAL_COMMUNITY): Payer: Self-pay | Admitting: General Practice

## 2017-03-05 ENCOUNTER — Inpatient Hospital Stay (HOSPITAL_COMMUNITY): Payer: Medicare Other

## 2017-03-05 LAB — CBC
HCT: 37.8 % — ABNORMAL LOW (ref 39.0–52.0)
Hemoglobin: 12 g/dL — ABNORMAL LOW (ref 13.0–17.0)
MCH: 26.7 pg (ref 26.0–34.0)
MCHC: 31.7 g/dL (ref 30.0–36.0)
MCV: 84 fL (ref 78.0–100.0)
PLATELETS: 204 10*3/uL (ref 150–400)
RBC: 4.5 MIL/uL (ref 4.22–5.81)
RDW: 15 % (ref 11.5–15.5)
WBC: 7.4 10*3/uL (ref 4.0–10.5)

## 2017-03-05 LAB — GLUCOSE, CAPILLARY
GLUCOSE-CAPILLARY: 189 mg/dL — AB (ref 65–99)
Glucose-Capillary: 183 mg/dL — ABNORMAL HIGH (ref 65–99)
Glucose-Capillary: 212 mg/dL — ABNORMAL HIGH (ref 65–99)
Glucose-Capillary: 218 mg/dL — ABNORMAL HIGH (ref 65–99)

## 2017-03-05 LAB — BASIC METABOLIC PANEL
ANION GAP: 8 (ref 5–15)
BUN: 75 mg/dL — ABNORMAL HIGH (ref 6–20)
CHLORIDE: 93 mmol/L — AB (ref 101–111)
CO2: 35 mmol/L — ABNORMAL HIGH (ref 22–32)
CREATININE: 4.54 mg/dL — AB (ref 0.61–1.24)
Calcium: 8.1 mg/dL — ABNORMAL LOW (ref 8.9–10.3)
GFR calc non Af Amer: 14 mL/min — ABNORMAL LOW (ref >60)
GFR, EST AFRICAN AMERICAN: 16 mL/min — AB (ref >60)
Glucose, Bld: 211 mg/dL — ABNORMAL HIGH (ref 65–99)
Potassium: 3 mmol/L — ABNORMAL LOW (ref 3.5–5.1)
Sodium: 136 mmol/L (ref 135–145)

## 2017-03-05 LAB — MAGNESIUM: Magnesium: 1.9 mg/dL (ref 1.7–2.4)

## 2017-03-05 MED ORDER — POTASSIUM CHLORIDE IN NACL 40-0.9 MEQ/L-% IV SOLN
INTRAVENOUS | Status: DC
  Administered 2017-03-05 (×2): 100 mL/h via INTRAVENOUS
  Filled 2017-03-05 (×2): qty 1000

## 2017-03-05 MED ORDER — HYDRALAZINE HCL 25 MG PO TABS
25.0000 mg | ORAL_TABLET | Freq: Three times a day (TID) | ORAL | Status: DC
  Administered 2017-03-05 – 2017-03-07 (×6): 25 mg via ORAL
  Filled 2017-03-05 (×6): qty 1

## 2017-03-05 MED ORDER — INFLUENZA VAC SPLIT QUAD 0.5 ML IM SUSY
0.5000 mL | PREFILLED_SYRINGE | INTRAMUSCULAR | Status: AC
  Administered 2017-03-06: 0.5 mL via INTRAMUSCULAR
  Filled 2017-03-05: qty 0.5

## 2017-03-05 MED ORDER — POTASSIUM CHLORIDE 10 MEQ/100ML IV SOLN
10.0000 meq | INTRAVENOUS | Status: AC
  Administered 2017-03-05 (×2): 10 meq via INTRAVENOUS
  Filled 2017-03-05: qty 100

## 2017-03-05 MED ORDER — CLONIDINE HCL 0.1 MG PO TABS
0.1000 mg | ORAL_TABLET | Freq: Two times a day (BID) | ORAL | Status: DC
  Administered 2017-03-05 – 2017-03-07 (×4): 0.1 mg via ORAL
  Filled 2017-03-05 (×4): qty 1

## 2017-03-05 NOTE — Progress Notes (Addendum)
CM received consult: problem with cpap at home. CM called and spoke with Greta / Edgardo Roysult and Pediatric Specialist @ [phone] and made provider aware pt having problems with cpap. Judeth Cornfield stated a referral to their respiratory therapist department will be made and CM to received a call back from the department. Gae Gallop RN,BSN,CM

## 2017-03-05 NOTE — Consult Note (Signed)
[facility] Renal Consultation Note  Requesting MD: Carolin Sicks Indication for Consultation:  Acute on chronic renal failure  HPI:  Kenneth Fox is a 46 y.o. male with poorly controlled diabetes mellitus, poorly controlled hypertension, morbid obesity, OSA on C Pap, diastolic heart failure, as well as CKD.  He has a pretty poor understanding of all of his medical processes. He sees me in the office. I saw him on 8/24. His weight was 347 pounds and his dry weight was noted to be somewhere in the 320s. I'd been attempting to diurese him without success. So at that office visit I had him increase his Lasix to 160 twice a day and added metolazone 5 mg 2 times per week.  His creatinine was at his pain is lying in the low twos at that time. He stated that about 4 days ago he had weakness and was unable to get out of bed but he continued to take his medications as prescribed. He finally presented with weakness. He was found to have a creatinine of greater than 4. His weight is 324 so probably at his dry weight. His blood pressure was borderline low.  Patient was admitted. He was given limited IV fluids. His diuretics have been held. He has remained on his clonidine and hydralazine. I think that he was taking amlodipine at home as well but he does not recall this.  He states that his urine output has been fine since admission even though none has been recorded.  He feels better since being admitted- urine showed granular casts  Creatinine, Ser  Date/Time Value Ref Range Status  03/05/2017 04:39 AM 4.54 (H) 0.61 - 1.24 mg/dL Final  03/04/2017 03:53 PM 4.27 (H) 0.61 - 1.24 mg/dL Final  02/04/2017 03:24 PM 2.40 (H) 0.61 - 1.24 mg/dL Final  01/28/2017 09:48 AM 2.33 (H) 0.61 - 1.24 mg/dL Final  01/05/2017 05:38 PM 2.70 (H) 0.61 - 1.24 mg/dL Final  11/09/2016 11:38 PM 2.30 (H) 0.61 - 1.24 mg/dL Final  10/26/2016 04:17 AM 2.22 (H) 0.61 - 1.24 mg/dL Final  10/25/2016 04:08 AM 2.22 (H) 0.61 - 1.24  mg/dL Final  10/24/2016 04:12 AM 2.23 (H) 0.61 - 1.24 mg/dL Final  10/21/2016 08:34 PM 2.11 (H) 0.61 - 1.24 mg/dL Final  10/21/2016 01:44 PM 2.23 (H) 0.61 - 1.24 mg/dL Final  10/15/2016 04:08 AM 2.58 (H) 0.61 - 1.24 mg/dL Final  10/14/2016 02:49 AM 2.36 (H) 0.61 - 1.24 mg/dL Final  10/13/2016 02:36 AM 1.93 (H) 0.61 - 1.24 mg/dL Final  10/12/2016 05:57 PM 2.05 (H) 0.61 - 1.24 mg/dL Final  05/11/2016 10:40 AM 1.91 (H) 0.61 - 1.24 mg/dL Final  05/10/2016 04:55 AM 2.57 (H) 0.61 - 1.24 mg/dL Final    Comment:    DELTA CHECK NOTED  05/09/2016 02:57 AM 4.33 (H) 0.61 - 1.24 mg/dL Final  05/08/2016 11:04 PM 4.58 (H) 0.61 - 1.24 mg/dL Final  05/06/2016 10:39 AM 1.80 (H) 0.61 - 1.24 mg/dL Final  05/04/2016 07:20 PM 1.51 (H) 0.61 - 1.24 mg/dL Final  02/20/2016 05:03 PM 1.74 (H) 0.61 - 1.24 mg/dL Final  12/13/2015 04:55 AM 1.80 (H) 0.61 - 1.24 mg/dL Final  12/12/2015 06:01 AM 1.46 (H) 0.61 - 1.24 mg/dL Final  12/11/2015 02:19 PM 1.63 (H) 0.61 - 1.24 mg/dL Final  12/09/2015 03:17 PM 2.17 (H) 0.61 - 1.24 mg/dL Final  12/05/2015 10:05 PM 1.81 (H) 0.61 - 1.24 mg/dL Final  11/03/2015 01:00 AM 1.37 (H) 0.61 - 1.24 mg/dL Final  11/02/2015 02:26  AM 2.01 (H) 0.61 - 1.24 mg/dL Final  10/30/2015 10:24 PM 1.56 (H) 0.61 - 1.24 mg/dL Final  10/25/2015 08:46 PM 1.55 (H) 0.61 - 1.24 mg/dL Final  07/29/2015 12:05 AM 1.75 (H) 0.61 - 1.24 mg/dL Final  07/27/2015 08:34 AM 1.42 (H) 0.61 - 1.24 mg/dL Final  07/26/2015 06:47 PM 1.41 (H) 0.61 - 1.24 mg/dL Final  06/27/2015 01:06 PM 1.39 (H) 0.61 - 1.24 mg/dL Final  06/06/2015 05:10 AM 2.01 (H) 0.61 - 1.24 mg/dL Final  06/05/2015 04:55 AM 2.17 (H) 0.61 - 1.24 mg/dL Final  06/04/2015 05:56 AM 3.00 (H) 0.61 - 1.24 mg/dL Final  06/03/2015 04:37 AM 4.30 (H) 0.61 - 1.24 mg/dL Final  06/02/2015 06:40 AM 5.38 (H) 0.61 - 1.24 mg/dL Final  06/01/2015 08:11 PM 5.20 (H) 0.61 - 1.24 mg/dL Final  05/29/2015 04:43 AM 1.44 (H) 0.61 - 1.24 mg/dL Final  05/24/2015 07:40 PM 1.47 (H)  0.61 - 1.24 mg/dL Final  05/23/2015 10:07 PM 1.33 (H) 0.61 - 1.24 mg/dL Final  01/18/2015 12:26 AM 1.10 0.61 - 1.24 mg/dL Final  03/14/2014 09:31 PM 1.00 0.50 - 1.35 mg/dL Final  03/13/2014 07:05 PM 0.85 0.50 - 1.35 mg/dL Final  03/11/2014 03:55 AM 0.98 0.50 - 1.35 mg/dL Final  03/10/2014 05:44 AM 1.02 0.50 - 1.35 mg/dL Final  03/09/2014 04:18 PM 0.97 0.50 - 1.35 mg/dL Final  03/09/2014 03:25 AM 0.96 0.50 - 1.35 mg/dL Final  03/08/2014 02:51 PM 1.00 0.50 - 1.35 mg/dL Final     PMHx:   Past Medical History:  Diagnosis Date  . Asthma   . CHF (congestive heart failure) ([place])   . Diabetes ([place])   . Gastroparesis   . GERD (gastroesophageal reflux disease)   . Hypertension   . Kidney disorder   . Neuropathy   . Obesity   . Obstructive sleep apnea    will not wear CPAP  . Renal insufficiency   . Sickle cell trait [facility])     Past Surgical History:  Procedure Laterality Date  . HIP FRACTURE SURGERY Right 1999   "put a plate in"    Family Hx:  Family History  Problem Relation Age of Onset  . Sickle cell anemia Mother   . Heart attack Mother   . Diabetes Father   . Neuropathy Father   . Hypertension Father   . Aneurysm Father   . Kidney disease Brother   . Kidney disease Sister     Social History:  reports that he has never smoked. He has never used smokeless tobacco. He reports that he does not drink alcohol or use drugs.  Allergies:  Allergies  Allergen Reactions  . Angiotensin Receptor Blockers Other (See Comments)    Acute renal failure in patient w R heart failure  . Lisinopril Other (See Comments)    Patient developed AKI after being on lisinopril for a week.    Medications: Prior to Admission medications   Medication Sig Start Date End Date Taking? Authorizing Provider  acetaminophen (TYLENOL) 500 MG tablet Take 1 tablet (500 mg total) by mouth every 6 (six) hours as needed. 02/05/17  Yes Law, Bea Graff, PA-C  cloNIDine (CATAPRES) 0.2 MG tablet Take 0.2  mg by mouth 2 (two) times daily. 12/11/16  Yes [provider]  furosemide (LASIX) 80 MG tablet Take 1 tablet (80 mg total) by mouth 2 (two) times daily. 10/15/16 03/04/17 Yes Aline August, MD  hydrALAZINE (APRESOLINE) 50 MG tablet Take 1.5 tablets (75 mg total) by mouth 3 (  three) times daily. Patient taking differently: Take 50 mg by mouth 3 (three) times daily.  10/26/16  Yes Dhungel, Nishant, MD  insulin aspart (NOVOLOG) 100 UNIT/ML injection Inject 10-20 Units into the skin 3 (three) times daily with meals. per sliding scale CBG 70 - 120: 0 units CBG 121 - 150: 2 units CBG 151 - 200: 3 units CBG 201 - 250: 5 units CBG 251 - 300: 8 units CBG 301 - 350: 11 units CBG 351 - 400: 15 units 06/06/15  Yes Robbie Lis, MD  insulin detemir (LEVEMIR) 100 UNIT/ML injection Inject 0.8 mLs (80 Units total) into the skin every evening. 12/13/15  Yes Rai, Ripudeep K, MD  methocarbamol (ROBAXIN) 500 MG tablet Take 1 tablet (500 mg total) by mouth 2 (two) times daily. 11/13/16  Yes Upstill, Nehemiah Settle, PA-C  metoCLOPramide (REGLAN) 10 MG tablet Take 1 tablet (10 mg total) by mouth every 6 (six) hours. Patient taking differently: Take 10 mg by mouth as needed.  02/05/17  Yes Law, Bea Graff, PA-C  metolazone (ZAROXOLYN) 5 MG tablet Take 5 mg by mouth 2 (two) times a week. 01/29/17  Yes [provider]  metoprolol tartrate (LOPRESSOR) 25 MG tablet Take 1 tablet (25 mg total) by mouth 2 (two) times daily. 10/26/16  Yes Dhungel, Nishant, MD  Oxycodone HCl 10 MG TABS Take 10 mg by mouth every 6 (six) hours as needed for pain.   Yes [provider]  OXYGEN Inhale 2 L into the lungs continuous.   Yes [provider]  polyethylene glycol (MIRALAX / GLYCOLAX) packet Take 17 g by mouth daily as needed for mild constipation. 01/28/17  Yes Davonna Belling, MD  potassium chloride SA (K-DUR,KLOR-CON) 20 MEQ tablet Take 20 mEq by mouth daily.   Yes [provider]  pregabalin (LYRICA) 150  MG capsule Take 150 mg by mouth 3 (three) times daily.   Yes [provider]    I have reviewed the patient's current medications.  Labs:  Results for orders placed or performed during the hospital encounter of 03/04/17 (from the past 48 hour(s))  CBG monitoring, ED     Status: Abnormal   Collection Time: 03/04/17  3:08 PM  Result Value Ref Range   Glucose-Capillary 130 (H) 65 - 99 mg/dL  CBC with Differential     Status: Abnormal   Collection Time: 03/04/17  3:53 PM  Result Value Ref Range   WBC 9.2 4.0 - 10.5 K/uL   RBC 4.59 4.22 - 5.81 MIL/uL   Hemoglobin 12.6 (L) 13.0 - 17.0 g/dL   HCT 38.4 (L) 39.0 - 52.0 %   MCV 83.7 78.0 - 100.0 fL   MCH 27.5 26.0 - 34.0 pg   MCHC 32.8 30.0 - 36.0 g/dL   RDW 14.7 11.5 - 15.5 %   Platelets 181 150 - 400 K/uL   Neutrophils Relative % 73 %   Neutro Abs 6.7 1.7 - 7.7 K/uL   Lymphocytes Relative 17 %   Lymphs Abs 1.6 0.7 - 4.0 K/uL   Monocytes Relative 10 %   Monocytes Absolute 0.9 0.1 - 1.0 K/uL   Eosinophils Relative 0 %   Eosinophils Absolute 0.0 0.0 - 0.7 K/uL   Basophils Relative 0 %   Basophils Absolute 0.0 0.0 - 0.1 K/uL  Basic metabolic panel     Status: Abnormal   Collection Time: 03/04/17  3:53 PM  Result Value Ref Range   Sodium 136 135 - 145 mmol/L   Potassium  2.5 (LL) 3.5 - 5.1 mmol/L    Comment: CRITICAL RESULT CALLED TO, READ BACK BY AND VERIFIED WITH: R KENNEDY,RN 1625 03/04/17 D BRADLEY    Chloride 93 (L) 101 - 111 mmol/L   CO2 30 22 - 32 mmol/L   Glucose, Bld 123 (H) 65 - 99 mg/dL   BUN 69 (H) 6 - 20 mg/dL   Creatinine, Ser 4.27 (H) 0.61 - 1.24 mg/dL   Calcium 7.8 (L) 8.9 - 10.3 mg/dL   GFR calc non Af Amer 15 (L) >60 mL/min   GFR calc Af Amer 17 (L) >60 mL/min    Comment: (NOTE) The eGFR has been calculated using the CKD EPI equation. This calculation has not been validated in all clinical situations. eGFR's persistently <60 mL/min signify possible Chronic Kidney Disease.    Anion gap 13 5 - 15   Urinalysis, Routine w reflex microscopic- may I&O cath if menses     Status: Abnormal   Collection Time: 03/04/17  6:45 PM  Result Value Ref Range   Color, Urine YELLOW YELLOW   APPearance HAZY (A) CLEAR   Specific Gravity, Urine 1.012 1.005 - 1.030   pH 5.0 5.0 - 8.0   Glucose, UA NEGATIVE NEGATIVE mg/dL   Hgb urine dipstick SMALL (A) NEGATIVE   Bilirubin Urine NEGATIVE NEGATIVE   Ketones, ur NEGATIVE NEGATIVE mg/dL   Protein, ur >=300 (A) NEGATIVE mg/dL   Nitrite NEGATIVE NEGATIVE   Leukocytes, UA NEGATIVE NEGATIVE   RBC / HPF 6-30 0 - 5 RBC/hpf   WBC, UA 0-5 0 - 5 WBC/hpf   Bacteria, UA RARE (A) NONE SEEN   Squamous Epithelial / LPF 0-5 (A) NONE SEEN   Mucus PRESENT    Hyaline Casts, UA PRESENT    Granular Casts, UA PRESENT    Amorphous Crystal PRESENT   Magnesium     Status: None   Collection Time: 03/04/17  8:42 PM  Result Value Ref Range   Magnesium 1.7 1.7 - 2.4 mg/dL  Glucose, capillary     Status: Abnormal   Collection Time: 03/04/17 10:12 PM  Result Value Ref Range   Glucose-Capillary 147 (H) 65 - 99 mg/dL  Basic metabolic panel     Status: Abnormal   Collection Time: 03/05/17  4:39 AM  Result Value Ref Range   Sodium 136 135 - 145 mmol/L   Potassium 3.0 (L) 3.5 - 5.1 mmol/L   Chloride 93 (L) 101 - 111 mmol/L   CO2 35 (H) 22 - 32 mmol/L   Glucose, Bld 211 (H) 65 - 99 mg/dL   BUN 75 (H) 6 - 20 mg/dL   Creatinine, Ser 4.54 (H) 0.61 - 1.24 mg/dL   Calcium 8.1 (L) 8.9 - 10.3 mg/dL   GFR calc non Af Amer 14 (L) >60 mL/min   GFR calc Af Amer 16 (L) >60 mL/min    Comment: (NOTE) The eGFR has been calculated using the CKD EPI equation. This calculation has not been validated in all clinical situations. eGFR's persistently <60 mL/min signify possible Chronic Kidney Disease.    Anion gap 8 5 - 15  CBC     Status: Abnormal   Collection Time: 03/05/17  4:39 AM  Result Value Ref Range   WBC 7.4 4.0 - 10.5 K/uL   RBC 4.50 4.22 - 5.81 MIL/uL   Hemoglobin 12.0 (L)  13.0 - 17.0 g/dL   HCT 37.8 (L) 39.0 - 52.0 %   MCV 84.0 78.0 - 100.0 fL   MCH  26.7 26.0 - 34.0 pg   MCHC 31.7 30.0 - 36.0 g/dL   RDW 15.0 11.5 - 15.5 %   Platelets 204 150 - 400 K/uL  Magnesium     Status: None   Collection Time: 03/05/17  4:39 AM  Result Value Ref Range   Magnesium 1.9 1.7 - 2.4 mg/dL  Glucose, capillary     Status: Abnormal   Collection Time: 03/05/17  8:22 AM  Result Value Ref Range   Glucose-Capillary 189 (H) 65 - 99 mg/dL  Glucose, capillary     Status: Abnormal   Collection Time: 03/05/17 11:55 AM  Result Value Ref Range   Glucose-Capillary 183 (H) 65 - 99 mg/dL     ROS:  A comprehensive review of systems was negative except for: Constitutional: positive for fatigue  Physical Exam: Vitals:   03/05/17 0436 03/05/17 1035  BP: (!) 121/101 (!) 107/56  Pulse: 71 66  Resp: 18   Temp: 98.4 F (36.9 C)   SpO2: 95%      General: fairly well appearing black male sitting on edge of bed. He has ordered a second meal HEENT: pupils are equally round and reactive to light, extraocular motions are intact, mucous members are moist Neck: there is no JVD Heart: regular rate and rhythm Lungs: mostly clear Abdomen: obese, soft, nontender Extremities: really no edema Skin: warm and dry Neuro: alert and nonfocal  Assessment/Plan:46 year old black male with diabetes, hypertension, diastolic heart failure, CKD with poor understanding/insight into his illnesses. He now presents with acute on chronic renal failure likely secondary to ATN 1.Renal- patient has baseline CKD with creatinine in the low twos. He is usually pretty volume overloaded. When he has volume on his blood pressure is up and that is why he is prescribed so many antihypertensives. When I embarked on a mission to get his volume status better, it worked a little bit too well and that he became hypotensive mostly because of the other antihypertensives. He appears to be euvolemic and in a good weight for him  now. I will continue to hold diuretics and 24 hours- but they will need to be reinitiated prior to discharge as he will get into trouble again. I will decrease clonidine and hydralazine and put parameters on them so his blood pressure doesn't continue to get low keeping him more ATN. I help this acute on chronic renal failure will resolve when blood pressure is improved.  He had an appointment at my office on 10/1 2. Hypertension/volume  - as above ATN from low blood pressure. Modified and put parameters on his antihypertensives to keep his blood pressure a little bit higher at this time to increase renal perfusion pressure to help with his acute on chronic renal failure 3. Anemia  - is not an issue at this time   Taya Ashbaugh A 03/05/2017, 2:06 PM

## 2017-03-05 NOTE — Progress Notes (Signed)
Initial Nutrition Assessment  DOCUMENTATION CODES:   Morbid obesity  INTERVENTION:   - Snacks TID BM to assist pt in eating small, frequent meals while admitted.   NUTRITION DIAGNOSIS:   Increased nutrient needs related to chronic illness (Uncontrolled T2DM,CKD stage III, obesity, HF,OSA) as evidenced by estimated needs.  GOAL:   Patient will meet greater than or equal to 90% of their needs  MONITOR:   PO intake, Weight trends, Supplement acceptance, Skin, Labs  REASON FOR ASSESSMENT:   Consult Assessment of nutrition requirement/status  ASSESSMENT:   46 year old male admitted for general weakness and acute kidney injury. PMH of CKD stage III, morbid obesity, chronic diastolic HF, OSA on CPAP, uncontrolled T2DM with neuropathy, and respiratory failure.  Pt reports ordering take out for all meals and eating pancakes, sausage and boiled eggs for breakfast. For lunch and dinner he orders foods such as hamburgers, fries, pizza, or chicken from the [employer]. Pt reports not being able to cook food for himself and living in a separate house from his wife and kids. Pt states his wife is encouraging him to eat healthy foods and small, frequent meals. Pts wife set an alarm on his phone to remind him to eat throughout the day. Pt reports being hungry since coming to the hospital and that the meals were "not enough".  Pt reports losing about 10 lbs in the past week due to sickness and poor appetite. Per weight records in chart, pt weighed 340 lb on 02/04/17 and weighed 324 lb on 03/04/17 (4.7% loss in one month).  Per chart, pt has uncontrolled Type 2 Diabetes and has issues obtaining test strips.   Medications: Catapres, Levemir  Labs: CBG 189 (H), K 3 (L), BUN 75 (H), Cr 4.54 (H), Ca 8.1 (H), GFR 14 (L)  Nutrition Focused Physical Exam Findings: No depletion.  Diet Order:  Diet Carb Modified Fluid consistency: Thin; Room service appropriate? Yes  Skin:  Reviewed, no issues  Last  BM:  03/05/17  Height:   Ht Readings from Last 1 Encounters:  03/04/17  (1.803 m)    Weight:   Wt Readings from Last 1 Encounters:  03/04/17 (!) 324 lb 1.6 oz (147 kg)    Ideal Body Weight:  78.2 kg  BMI:  Body mass index is 45.2 kg/m.  Estimated Nutritional Needs:   Kcal:  2100-2300 kcals  Protein:  80-90 grams protein  Fluid:  >/= 2 L  EDUCATION NEEDS:   No education needs identified at this time  Wynetta Emery [facility] Dietetic Intern Pager: [phone] 03/05/2017 12:41 PM

## 2017-03-05 NOTE — Progress Notes (Signed)
Patient arrived to the unit via bed from the emergency department. Patient is alert and oriented x 4. Patient is ambulatory with rolling walker.  No complaints of pain.  Vitals : Temp 98.8; BP: 119/74; Pulse 72; Resp 20 and 93% on room air. Skin assessment complete.  IV intact to he Right and left antecubital.  Placed the patient on cardiac monitoring.  Educated the patient on how to reach the staff on the unit.  Educated the patient on the importance on calling for assistance prior to getting up.  Lowered the bed and placed the call light within reach.  Will continue to monitor the patient.

## 2017-03-05 NOTE — Progress Notes (Signed)
PROGRESS NOTE    Kenneth Fox  ZOX:[id number] DOB: Sep 15, 1970 DOA: 03/04/2017 PCP: Lindaann Pascal, PA-C   Brief Narrative: 46 y.o. male with medical history significant of morbid obesity, chronic diastolic congestive heart failure, OSA on CPAP, chronic respiratory failure with hypoxia on 3 L of oxygen at home, uncontrolled type 2 diabetes with neuropathy, chronic kidney disease stage III with baseline serum creatinine level of 2.5, multiple prior admission for congestive heart failure presented with generalized weakness and decreased by mouth intake. Patient was taking Lasix at home and metolazone was added 3 weeks prior to admission for the management of lower extremity edema by his nephrologist. In the ER patient was found to have worsening serum creatinine level associated with serum potassium level of 2.5. Admitted for further evaluation.  Assessment & Plan:   # Acute kidney injury on chronic kidney disease stage III likely hemodynamically mediated in the setting of Lasix and recent addition of metolazone: -Patient has no lower extremity edema and has dry mucous membrane.  -UA with significant proteinuria which is unchanged. Quantify urine protein. Ultrasound of kidneys unremarkable. Patient has BUN and serum creatinine level trending up. Creatinine level of 4.5 today. Plan to continue IV fluid with potassium per 12 more hours. Continue to monitor volume status. Patient is still looks mildly dehydrated. Monitor urine output. Avoid nephrotoxins. Nephrology consult requested and discussed with [name].  #Severe hypokalemia in the setting of diuretics and decreased oral intake: Continue to replete IV and oral potassium chloride. Monitor lab. Magnesium level acceptable.  #Chronic diastolic congestive heart failure: Patient is euvolemic and has no lower extremity edema. Currently diuretics on hold because of renal failure. -Continue hydralazine, metoprolol  #Diabetes on chronic  insulin with neuropathy and gastroparesis: Follow-up A1c level. Continue current insulin regimen. Monitor blood sugar level. Carb modified diet. -Lower the dose of pregabalin -Reglan for the management of diabetic gastroparesis  # OSA/ chronic respiratory failure with hypoxia: -Continue CPAP. Case manager consulted for the evaluation of problem with CPAP at home. Continue 3 L of oxygen. DuoNeb as needed. -Discussed with the case manager today.  # Chronic metabolic encephalopathy, generalized weakness: Due to electrolytic imbalance, renal failure, CHF. -Mentally status significantly improved -PT, OT and case manager evaluation -Dietary consult  #Essential hypertension: Continue clonidine, hydralazine, metoprolol. Monitor heart rate and blood pressure.  #Morbid obesity: Dietary consult  DVT prophylaxis: Heparin subcutaneous Code Status: Full code Family Communication: Discussed with the patient's wife at bedside Disposition Plan: Currently admitted    Consultants:   Nephrologist  Procedures: None Antimicrobials: None  Subjective: Seen and examined at bedside. Patient is sitting on bed comfortable. Denied headache, dizziness, nausea vomiting. Feels weak. Mental status improved.  Objective: Vitals:   03/04/17 2201 03/04/17 2214 03/05/17 0436 03/05/17 1035  BP:  119/74 (!) 121/101 (!) 107/56  Pulse:  72 71 66  Resp:  20 18   Temp:  98.8 F (37.1 C) 98.4 F (36.9 C)   TempSrc:  Oral Oral   SpO2:  93% 95%   Weight:  (!) 147 kg (324 lb 1.6 oz)    Height:  (1.803 m)       Intake/Output Summary (Last 24 hours) at 03/05/17 1418 Last data filed at 03/05/17 0544  Gross per 24 hour  Intake           698.33 ml  Output                0 ml  Net  698.33 ml   Filed Weights   03/04/17 1507 03/04/17 2214  Weight: (!) 154.2 kg (340 lb) (!) 147 kg (324 lb 1.6 oz)    Examination:  General exam: Morbidly obese male sitting on bed comfortable Respiratory  system: Clear to auscultation. Respiratory effort normal. No wheezing or crackle Cardiovascular system: S1 & S2 heard, RRR.  No pedal edema. Gastrointestinal system: Abdomen is  soft and nontender. Normal bowel sounds heard. Central nervous system: Alert and oriented. No focal neurological deficits. Extremities: Symmetric 5 x 5 power. Skin: No rashes, lesions or ulcers Psychiatry: Judgement and insight appear normal. Mood & affect appropriate.     Data Reviewed: I have personally reviewed following labs and imaging studies  CBC:  Recent Labs Lab 03/04/17 1553 03/05/17 0439  WBC 9.2 7.4  NEUTROABS 6.7  --   HGB 12.6* 12.0*  HCT 38.4* 37.8*  MCV 83.7 84.0  PLT 181 204   Basic Metabolic Panel:  Recent Labs Lab 03/04/17 1553 03/04/17 2042 03/05/17 0439  NA 136  --  136  K 2.5*  --  3.0*  CL 93*  --  93*  CO2 30  --  35*  GLUCOSE 123*  --  211*  BUN 69*  --  75*  CREATININE 4.27*  --  4.54*  CALCIUM 7.8*  --  8.1*  MG  --  1.7 1.9   GFR: Estimated Creatinine Clearance: 29.3 mL/min (A) (by C-G formula based on SCr of 4.54 mg/dL (H)). Liver Function Tests: No results for input(s): AST, ALT, ALKPHOS, BILITOT, PROT, ALBUMIN in the last 168 hours. No results for input(s): LIPASE, AMYLASE in the last 168 hours. No results for input(s): AMMONIA in the last 168 hours. Coagulation Profile: No results for input(s): INR, PROTIME in the last 168 hours. Cardiac Enzymes: No results for input(s): CKTOTAL, CKMB, CKMBINDEX, TROPONINI in the last 168 hours. BNP (last 3 results) No results for input(s): PROBNP in the last 8760 hours. HbA1C: No results for input(s): HGBA1C in the last 72 hours. CBG:  Recent Labs Lab 03/04/17 1508 03/04/17 2212 03/05/17 0822 03/05/17 1155  GLUCAP 130* 147* 189* 183*   Lipid Profile: No results for input(s): CHOL, HDL, LDLCALC, TRIG, CHOLHDL, LDLDIRECT in the last 72 hours. Thyroid Function Tests: No results for input(s): TSH, T4TOTAL,  FREET4, T3FREE, THYROIDAB in the last 72 hours. Anemia Panel: No results for input(s): VITAMINB12, FOLATE, FERRITIN, TIBC, IRON, RETICCTPCT in the last 72 hours. Sepsis Labs: No results for input(s): PROCALCITON, LATICACIDVEN in the last 168 hours.  No results found for this or any previous visit (from the past 240 hour(s)).       Radiology Studies: US Renal  Result Date: 03/05/2017 CLINICAL DATA:  Renal insufficiency. EXAM: RENAL / URINARY TRACT ULTRASOUND COMPLETE COMPARISON:  CT 02/04/2017.  Ultrasound 05/09/2016. FINDINGS: Right Kidney: Length: 11.5 cm. Echogenicity within normal limits. No mass or hydronephrosis visualized. Left Kidney: Length: 12.4 cm. Echogenicity within normal limits. No mass or hydronephrosis visualized. Bladder: Appears normal for degree of bladder distention. IMPRESSION: No acute or focal abnormality. Electronically Signed   By: Maisie Fus  Register   On: 03/05/2017 13:33        Scheduled Meds: . cloNIDine  0.2 mg Oral BID  . heparin  5,000 Units Subcutaneous Q8H  . hydrALAZINE  75 mg Oral TID  . [START ON 03/06/2017] Influenza vac split quadrivalent PF  0.5 mL Intramuscular Tomorrow-1000  . insulin aspart  0-9 Units Subcutaneous TID WC  . insulin detemir  80 Units Subcutaneous QHS  . methocarbamol  500 mg Oral BID  . metoprolol tartrate  25 mg Oral BID  . potassium chloride SA  40 mEq Oral TID  . pregabalin  100 mg Oral Daily   Continuous Infusions: . 0.9 % NaCl with KCl 40 mEq / L    . potassium chloride       LOS: 1 day    Lakota Markgraf Jaynie Collins, MD [facility] Pager [phone]  If 7PM-7AM, please contact night-coverage [email or web address] Password [email or web address] 03/05/2017, 2:18 PM

## 2017-03-05 NOTE — Evaluation (Addendum)
Occupational Therapy Evaluation and Discharge  Patient Details Name: Kenneth Fox MRN: [id number] DOB: 1970/11/15 Today's Date: 03/05/2017    History of Present Illness Kenneth Fox is a 46 y.o. male PHMx: morbid obesity, chronic diastolic CHF, OSA on CPAP, CRF with hypoxia on 3 L of oxygen at home, uncontrolled DM2 with neuropathy, CKD stage III, multiple prior admission CHF presented with generalized weakness and decreased by mouth intake   Clinical Impression   This 46 yo male admitted for above presents to acute OT at an intermittent S level due to his general deconditioning and he will have this from his wife per his report. No further OT needs, we will sign off.     Follow Up Recommendations  No OT follow up;Supervision - Intermittent    Equipment Recommendations  None recommended by OT       Precautions / Restrictions Precautions Precautions: Fall Precaution Comments: monitor O2, DOE Restrictions Weight Bearing Restrictions: No      Mobility Bed Mobility               General bed mobility comments: Pt sitting on EOB upon my arrival  Transfers   Equipment used: Rolling walker (2 wheeled)             General transfer comment: S overall    Balance Overall balance assessment: Needs assistance Sitting-balance support: No upper extremity supported;Feet supported Sitting balance-Leahy Scale: Good     Standing balance support: Bilateral upper extremity supported Standing balance-Leahy Scale: Poor Standing balance comment: feels like he needs to rely on RW at this time due to no energy                           ADL either performed or assessed with clinical judgement   ADL                                         General ADL Comments: overall at an intermittent S level due to current medical issues. Pt is very aware of energy conservation techniques     Vision Patient Visual Report: No change from baseline               Pertinent Vitals/Pain Pain Assessment: No/denies pain     Hand Dominance Right   Extremity/Trunk Assessment Upper Extremity Assessment Upper Extremity Assessment: Overall WFL for tasks assessed           mmunication Communication Communication: No difficulties   Cognition Arousal/Alertness: Awake/alert Behavior During Therapy: WFL for tasks assessed/performed Overall Cognitive Status: Within Functional Limits for tasks assessed                                                Home Living Family/patient expects to be discharged to:: Private residence Living Arrangements: Alone Available Help at Discharge: Available PRN/intermittently;Family (wife, they are separated but she does A him) Type of Home: Apartment Home Access: Level entry     Home Layout: One level     Bathroom Shower/Tub: Tub/shower unit;Curtain   Firefighter: Standard     Home Equipment: Environmental consultant - 2 wheels;Cane - single point;Shower seat   Additional Comments: wife comes to assist intermittently      Prior  Functioning/Environment Level of Independence: Needs assistance  Gait / Transfers Assistance Needed: has had to use a RW as of late ADL's / Homemaking Assistance Needed: has needed A from wife as of late due to no energy            OT Problem List: Cardiopulmonary status limiting activity;Decreased activity tolerance         OT Goals(Current goals can be found in the care plan section) Acute Rehab OT Goals Patient Stated Goal: to get stronger and go home  OT Frequency:                AM-PAC PT "6 Clicks" Daily Activity     Outcome Measure Help from another person eating meals?: None Help from another person taking care of personal grooming?: A Little (needs intermittent S) Help from another person toileting, which includes using toliet, bedpan, or urinal?: A Little (needs intermittent S) Help from another person bathing (including washing, rinsing,  drying)?: A Little (needs intermittent S) Help from another person to put on and taking off regular upper body clothing?: A Little (needs intermittent S) Help from another person to put on and taking off regular lower body clothing?: A Little (needs intermittent S) 6 Click Score: 19   End of Session Equipment Utilized During Treatment: Rolling walker;Gait belt (pt had O2 off and winded with up to door of room and back to bed (reapplied at 3 liters--tubing that he had laid on his bed))  Activity Tolerance:  (pt reports decreased endurance for activity) Patient left:  (sitting EOB)  OT Visit Diagnosis: Muscle weakness (generalized) (M62.81)                Time: 1610-9604 OT Time Calculation (min): 21 min Charges:  OT General Charges $OT Visit: 1 Visit OT Evaluation $OT Eval Moderate Complexity: [address] [place] [phone] 03/05/2017

## 2017-03-05 NOTE — Progress Notes (Signed)
PT Cancellation Note  Patient Details Name: Kenneth Fox MRN: [id number] DOB: [birth year]   Cancelled Treatment:    Reason Eval/Treat Not Completed: Patient at procedure or test/unavailable. Will check back as schedule allows to complete PT eval.   Marylynn Pearson 03/05/2017, 2:10 PM   Conni Slipper, PT, DPT Acute Rehabilitation Services Pager: [phone]

## 2017-03-06 LAB — RENAL FUNCTION PANEL
ALBUMIN: 2.5 g/dL — AB (ref 3.5–5.0)
ANION GAP: 9 (ref 5–15)
BUN: 62 mg/dL — AB (ref 6–20)
CALCIUM: 8.4 mg/dL — AB (ref 8.9–10.3)
CO2: 30 mmol/L (ref 22–32)
Chloride: 103 mmol/L (ref 101–111)
Creatinine, Ser: 3.6 mg/dL — ABNORMAL HIGH (ref 0.61–1.24)
GFR calc Af Amer: 21 mL/min — ABNORMAL LOW (ref >60)
GFR, EST NON AFRICAN AMERICAN: 19 mL/min — AB (ref >60)
GLUCOSE: 66 mg/dL (ref 65–99)
PHOSPHORUS: 2.1 mg/dL — AB (ref 2.5–4.6)
POTASSIUM: 3.7 mmol/L (ref 3.5–5.1)
SODIUM: 142 mmol/L (ref 135–145)

## 2017-03-06 LAB — HEMOGLOBIN A1C
Hgb A1c MFr Bld: 10 % — ABNORMAL HIGH (ref 4.8–5.6)
Mean Plasma Glucose: 240 mg/dL

## 2017-03-06 LAB — GLUCOSE, CAPILLARY
GLUCOSE-CAPILLARY: 126 mg/dL — AB (ref 65–99)
Glucose-Capillary: 135 mg/dL — ABNORMAL HIGH (ref 65–99)
Glucose-Capillary: 56 mg/dL — ABNORMAL LOW (ref 65–99)
Glucose-Capillary: 184 mg/dL — ABNORMAL HIGH (ref 65–99)
Glucose-Capillary: 206 mg/dL — ABNORMAL HIGH (ref 65–99)

## 2017-03-06 LAB — PROTEIN / CREATININE RATIO, URINE
Creatinine, Urine: 148.06 mg/dL
Protein Creatinine Ratio: 4.13 mg/mg{Cre} — ABNORMAL HIGH (ref 0.00–0.15)
Total Protein, Urine: 611 mg/dL

## 2017-03-06 MED ORDER — PREGABALIN 75 MG PO CAPS
150.0000 mg | ORAL_CAPSULE | Freq: Two times a day (BID) | ORAL | Status: DC
  Administered 2017-03-06 – 2017-03-07 (×2): 150 mg via ORAL
  Filled 2017-03-06 (×3): qty 2

## 2017-03-06 MED ORDER — POTASSIUM CHLORIDE CRYS ER 20 MEQ PO TBCR
40.0000 meq | EXTENDED_RELEASE_TABLET | Freq: Every day | ORAL | Status: DC
  Administered 2017-03-07: 40 meq via ORAL
  Filled 2017-03-06: qty 2

## 2017-03-06 MED ORDER — FUROSEMIDE 40 MG PO TABS
40.0000 mg | ORAL_TABLET | Freq: Two times a day (BID) | ORAL | Status: DC
  Administered 2017-03-06 – 2017-03-07 (×2): 40 mg via ORAL
  Filled 2017-03-06 (×2): qty 1

## 2017-03-06 MED ORDER — PREGABALIN 100 MG PO CAPS
100.0000 mg | ORAL_CAPSULE | Freq: Once | ORAL | Status: AC
  Administered 2017-03-06: 100 mg via ORAL
  Filled 2017-03-06: qty 1

## 2017-03-06 NOTE — Progress Notes (Signed)
PROGRESS NOTE    Kenneth Fox  ONG:[id number] DOB: 1971-04-20 DOA: 03/04/2017 PCP: Lindaann Pascal, PA-C   Brief Narrative: 46 y.o. male with medical history significant of morbid obesity, chronic diastolic congestive heart failure, OSA on CPAP, chronic respiratory failure with hypoxia on 3 L of oxygen at home, uncontrolled type 2 diabetes with neuropathy, chronic kidney disease stage III with baseline serum creatinine level of 2.5, multiple prior admission for congestive heart failure presented with generalized weakness and decreased by mouth intake. Patient was taking Lasix at home and metolazone was added 3 weeks prior to admission for the management of lower extremity edema by his nephrologist. In the ER patient was found to have worsening serum creatinine level associated with serum potassium level of 2.5. Admitted for further evaluation.  Assessment & Plan:   # Acute kidney injury on chronic kidney disease stage III likely hemodynamically mediated in the setting of Lasix and recent addition of metolazone: -Ultrasound unremarkable. Patient received IV fluid with improvement in serum creatinine level. Discontinue IV fluid and resume lower dose of Lasix 40 mg twice a day today. Monitor urine output and BMP. Nephrology consult appreciated. He does close outpatient further follow-up. Repeat BMP in the morning.  #Severe hypokalemia in the setting of diuretics and decreased oral intake: Continue oral potassium. Monitor BMP.  #Chronic diastolic congestive heart failure: Patient is euvolemic and has no lower extremity edema. -Resume Lasix.  -Continue hydralazine, metoprolol  #Diabetes on chronic insulin with neuropathy and gastroparesis: A1c level of 10. Continue current insulin regimen. Monitor blood sugar level. Carb modified diet. -Lower the dose of pregabalin -Reglan for the management of diabetic gastroparesis -Diabetic educator referral  # OSA/ chronic respiratory failure with  hypoxia: -Continue CPAP. Case manager consulted for the evaluation of problem with CPAP at home. Continue 3 L of oxygen. DuoNeb as needed.  # Chronic metabolic encephalopathy, generalized weakness: Due to electrolytic imbalance, renal failure, CHF. -Mentally status improved but he still feels weakness. Continue therapies and supportive care.  #Essential hypertension: Currently on clonidine, hydralazine, metoprolol and Lasix. The dose adjusted. Continue to monitor.  #Morbid obesity: Dietary consult  DVT prophylaxis: Heparin subcutaneous Code Status: Full code Family Communication: Discussed with the patient's wife at bedside Disposition Plan: Currently admitted    Consultants:   Nephrologist  Procedures: None Antimicrobials: None  Subjective: Seen and examined at bedside. Feels weak tired, not at baseline. Denied headache, dizziness, chest pain or shortness of breath. Objective: Vitals:   03/05/17 1422 03/05/17 1631 03/05/17 2115 03/06/17 0521  BP: 111/73 132/71 125/88 (!) 144/94  Pulse: 61 62 65 64  Resp: 18  17 (!) 22  Temp: 97.8 F (36.6 C)  97.6 F (36.4 C) (!) 97.4 F (36.3 C)  TempSrc: Oral  Oral Oral  SpO2: 97%  92% 97%  Weight:      Height:        Intake/Output Summary (Last 24 hours) at 03/06/17 1220 Last data filed at 03/06/17 4401  Gross per 24 hour  Intake           596.67 ml  Output              600 ml  Net            -3.33 ml   Filed Weights   03/04/17 1507 03/04/17 2214  Weight: (!) 154.2 kg (340 lb) (!) 147 kg (324 lb 1.6 oz)    Examination:  General exam: Moderately obese male, not in distress Respiratory system:  Clear bilaterally, no wheezing Cardiovascular system: Regular rate rhythm, S1-S2 normal. No pedal edema Gastrointestinal system: Abdomen soft, nontender. Nondistended. Bowel sound positive Central nervous system: Alert awake and oriented. Skin: No rashes, lesions or ulcers Psychiatry: Judgement and insight appear normal. Mood &  affect appropriate.     Data Reviewed: I have personally reviewed following labs and imaging studies  CBC:  Recent Labs Lab 03/04/17 1553 03-30-2017 0439  WBC 9.2 7.4  NEUTROABS 6.7  --   HGB 12.6* 12.0*  HCT 38.4* 37.8*  MCV 83.7 84.0  PLT 181 204   Basic Metabolic Panel:  Recent Labs Lab 03/04/17 1553 03/04/17 2042 Mar 30, 2017 0439 03/06/17 0533  NA 136  --  136 142  K 2.5*  --  3.0* 3.7  CL 93*  --  93* 103  CO2 30  --  35* 30  GLUCOSE 123*  --  211* 66  BUN 69*  --  75* 62*  CREATININE 4.27*  --  4.54* 3.60*  CALCIUM 7.8*  --  8.1* 8.4*  MG  --  1.7 1.9  --   PHOS  --   --   --  2.1*   GFR: Estimated Creatinine Clearance: 36.9 mL/min (A) (by C-G formula based on SCr of 3.6 mg/dL (H)). Liver Function Tests:  Recent Labs Lab 03/06/17 0533  ALBUMIN 2.5*   No results for input(s): LIPASE, AMYLASE in the last 168 hours. No results for input(s): AMMONIA in the last 168 hours. Coagulation Profile: No results for input(s): INR, PROTIME in the last 168 hours. Cardiac Enzymes: No results for input(s): CKTOTAL, CKMB, CKMBINDEX, TROPONINI in the last 168 hours. BNP (last 3 results) No results for input(s): PROBNP in the last 8760 hours. HbA1C:  Recent Labs  03/04/17 1553  HGBA1C 10.0*   CBG:  Recent Labs Lab 03/30/17 1155 March 30, 2017 1637 03-30-2017 2201 03/06/17 0746 03/06/17 0822  GLUCAP 183* 212* 218* 56* 135*   Lipid Profile: No results for input(s): CHOL, HDL, LDLCALC, TRIG, CHOLHDL, LDLDIRECT in the last 72 hours. Thyroid Function Tests: No results for input(s): TSH, T4TOTAL, FREET4, T3FREE, THYROIDAB in the last 72 hours. Anemia Panel: No results for input(s): VITAMINB12, FOLATE, FERRITIN, TIBC, IRON, RETICCTPCT in the last 72 hours. Sepsis Labs: No results for input(s): PROCALCITON, LATICACIDVEN in the last 168 hours.  No results found for this or any previous visit (from the past 240 hour(s)).       Radiology Studies: US Renal  Result  Date: 03/30/2017 CLINICAL DATA:  Renal insufficiency. EXAM: RENAL / URINARY TRACT ULTRASOUND COMPLETE COMPARISON:  CT 02/04/2017.  Ultrasound 05/09/2016. FINDINGS: Right Kidney: Length: 11.5 cm. Echogenicity within normal limits. No mass or hydronephrosis visualized. Left Kidney: Length: 12.4 cm. Echogenicity within normal limits. No mass or hydronephrosis visualized. Bladder: Appears normal for degree of bladder distention. IMPRESSION: No acute or focal abnormality. Electronically Signed   By: Maisie Fus  Register   On: March 30, 2017 13:33        Scheduled Meds: . cloNIDine  0.1 mg Oral BID  . furosemide  40 mg Oral BID  . heparin  5,000 Units Subcutaneous Q8H  . hydrALAZINE  25 mg Oral TID  . insulin aspart  0-9 Units Subcutaneous TID WC  . insulin detemir  80 Units Subcutaneous QHS  . methocarbamol  500 mg Oral BID  . metoprolol tartrate  25 mg Oral BID  . potassium chloride SA  40 mEq Oral TID  . pregabalin  100 mg Oral Daily   Continuous Infusions:  LOS: 2 days    Latunya Kissick Jaynie Collins, MD [facility] Pager [phone]  If 7PM-7AM, please contact night-coverage [email or web address] Password [email or web address] 03/06/2017, 12:20 PM

## 2017-03-06 NOTE — Progress Notes (Signed)
Subjective:  600 of UOP recorded, looks like shift was missed- kidney numbers are better Objective Vital signs in last 24 hours: Vitals:   03/05/17 1422 03/05/17 1631 03/05/17 2115 03/06/17 0521  BP: 111/73 132/71 125/88 (!) 144/94  Pulse: 61 62 65 64  Resp: 18  17 (!) 22  Temp: 97.8 F (36.6 C)  97.6 F (36.4 C) (!) 97.4 F (36.3 C)  TempSrc: Oral  Oral Oral  SpO2: 97%  92% 97%  Weight:      Height:       Weight change:   Intake/Output Summary (Last 24 hours) at 03/06/17 1155 Last data filed at 03/06/17 1191  Gross per 24 hour  Intake           596.67 ml  Output              600 ml  Net            -3.33 ml    Assessment/Plan:46 year old black male with diabetes, hypertension, diastolic heart failure, CKD with poor understanding/insight into his illnesses. He now presents with acute on chronic renal failure likely secondary to ATN 1.Renal- patient has baseline CKD with creatinine in the low twos. He is usually pretty volume overloaded. When he has volume on his blood pressure is up and that is why he is prescribed so many antihypertensives. When I embarked on a mission to get his volume status better, it worked a little bit too well and that he became hypotensive mostly because of the other antihypertensives. He appears to be euvolemic and in a good weight for him now. I will continue to hold diuretics another 24 hours- but they will need to be reinitiated prior to discharge as he will get into trouble again- I see 40 BID started today. I will decrease clonidine and hydralazine and put parameters on them so his blood pressure doesn't continue to get too low giving  him more ATN. Kidney function seeming to get better.  He had an appointment at my office on 10/1- will follow him at discharge 2. Hypertension/volume  - as above ATN from low blood pressure. Modified and put parameters on his antihypertensives to keep his blood pressure a little bit higher at this time to increase renal  perfusion pressure to help with his acute on chronic renal failure 3. Anemia  - is not an issue at this time 4. Dispo- pt tells me he will go tomorrow.  I would like input on the medication reg for home and I will arrange follow up with me. Right now clonidine 0.1 BID and hydralazine 25 TID lasix 40 BID    Shelby Anderle A    Labs: Basic Metabolic Panel:  Recent Labs Lab 03/04/17 1553 03/05/17 0439 03/06/17 0533  NA 136 136 142  K 2.5* 3.0* 3.7  CL 93* 93* 103  CO2 30 35* 30  GLUCOSE 123* 211* 66  BUN 69* 75* 62*  CREATININE 4.27* 4.54* 3.60*  CALCIUM 7.8* 8.1* 8.4*  PHOS  --   --  2.1*   Liver Function Tests:  Recent Labs Lab 03/06/17 0533  ALBUMIN 2.5*   No results for input(s): LIPASE, AMYLASE in the last 168 hours. No results for input(s): AMMONIA in the last 168 hours. CBC:  Recent Labs Lab 03/04/17 1553 03/05/17 0439  WBC 9.2 7.4  NEUTROABS 6.7  --   HGB 12.6* 12.0*  HCT 38.4* 37.8*  MCV 83.7 84.0  PLT 181 204   Cardiac Enzymes: No results  for input(s): CKTOTAL, CKMB, CKMBINDEX, TROPONINI in the last 168 hours. CBG:  Recent Labs Lab 03/05/17 1155 03/05/17 1637 03/05/17 2201 03/06/17 0746 03/06/17 0822  GLUCAP 183* 212* 218* 56* 135*    Iron Studies: No results for input(s): IRON, TIBC, TRANSFERRIN, FERRITIN in the last 72 hours. Studies/Results: US Renal  Result Date: 03/05/2017 CLINICAL DATA:  Renal insufficiency. EXAM: RENAL / URINARY TRACT ULTRASOUND COMPLETE COMPARISON:  CT 02/04/2017.  Ultrasound 05/09/2016. FINDINGS: Right Kidney: Length: 11.5 cm. Echogenicity within normal limits. No mass or hydronephrosis visualized. Left Kidney: Length: 12.4 cm. Echogenicity within normal limits. No mass or hydronephrosis visualized. Bladder: Appears normal for degree of bladder distention. IMPRESSION: No acute or focal abnormality. Electronically Signed   By: Maisie Fus  Register   On: 03/05/2017 13:33   Medications: Infusions:   Scheduled  Medications: . cloNIDine  0.1 mg Oral BID  . furosemide  40 mg Oral BID  . heparin  5,000 Units Subcutaneous Q8H  . hydrALAZINE  25 mg Oral TID  . insulin aspart  0-9 Units Subcutaneous TID WC  . insulin detemir  80 Units Subcutaneous QHS  . methocarbamol  500 mg Oral BID  . metoprolol tartrate  25 mg Oral BID  . potassium chloride SA  40 mEq Oral TID  . pregabalin  100 mg Oral Daily    have reviewed scheduled and prn medications.  Physical Exam: General: NAD- wife at bedside Heart: RRR Lungs: mostly clear Abdomen: obese, soft, non tender Extremities: minimal edema    03/06/2017,11:55 AM  LOS: 2 days

## 2017-03-06 NOTE — Evaluation (Signed)
Physical Therapy Evaluation Patient Details Name: Kenneth Fox MRN: [id number] DOB: Nov 27, 1970 Today's Date: 03/06/2017   History of Present Illness  Kenneth Fox is a 46 y.o. male with medical history significant of morbid obesity, chronic diastolic congestive heart failure, OSA on CPAP, chronic respiratory failure with hypoxia on 3 L of oxygen at home, uncontrolled type 2 diabetes with neuropathy, chronic kidney disease stage III with baseline serum creatinine level of 2.5, multiple prior admission for congestive heart failure presented with generalized weakness and decreased by mouth intake.   Clinical Impression  Pt admitted with above diagnosis. Pt currently with functional limitations due to the deficits listed below (see PT Problem List). Demonstrates general weakness and deconditioning. Complains mostly of back pain and his neuropathy. He has difficulty ambulating throughout the house even with a rolling walker due to fatigue. May benefit from HHPT focusing on conditioning and general function. Pt will benefit from skilled PT to increase their independence and safety with mobility to allow discharge to the venue listed below.       Follow Up Recommendations Home health PT    Equipment Recommendations  Other (comment) Teacher, adult education rollator)    Recommendations for Other Services       Precautions / Restrictions Precautions Precautions: Fall Precaution Comments: monitor O2, DOE Restrictions Weight Bearing Restrictions: No      Mobility  Bed Mobility Overal bed mobility: Independent                Transfers Overall transfer level: Modified independent Equipment used: Rolling walker (2 wheeled)             General transfer comment: Extra time, no assist, good stability with use of RW for support.  Ambulation/Gait Ambulation/Gait assistance: Supervision Ambulation Distance (Feet): 100 Feet Assistive device: Rolling walker (2 wheeled) Gait  Pattern/deviations: Step-through pattern;Decreased stride length Gait velocity: decreased Gait velocity interpretation: Below normal speed for age/gender General Gait Details: Required one standing rest break during bout, moderate dyspnea with SpO2 98% on 2L and measured once again on 3L throughout bout. No significant instability noted although relies moderately on RW at times, other times he is able to pick it up and ambulate. No buckling noted. Educated on energy conservation techniques.  Stairs            Wheelchair Mobility    Modified Rankin (Stroke Patients Only)       Balance Overall balance assessment: Modified Independent                                           Pertinent Vitals/Pain Pain Assessment: Faces Faces Pain Scale: Hurts even more Pain Location: back Pain Descriptors / Indicators: Aching Pain Intervention(s): Monitored during session;Repositioned    Home Living Family/patient expects to be discharged to:: Private residence Living Arrangements: Alone Available Help at Discharge: Available PRN/intermittently;Family Type of Home: Apartment Home Access: Level entry     Home Layout: One level Home Equipment: Walker - 2 wheels;Cane - single point;Shower seat Additional Comments: wife comes to assist intermittently    Prior Function Level of Independence: Needs assistance   Gait / Transfers Assistance Needed: has had to use a RW as of late  ADL's / Homemaking Assistance Needed: has needed A from wife as of late due to no energy        Hand Dominance   Dominant Hand: Right  Extremity/Trunk Assessment   Upper Extremity Assessment Upper Extremity Assessment: Defer to OT evaluation    Lower Extremity Assessment Lower Extremity Assessment: Generalized weakness       Communication   Communication: No difficulties  Cognition Arousal/Alertness: Awake/alert Behavior During Therapy: WFL for tasks assessed/performed Overall  Cognitive Status: Within Functional Limits for tasks assessed                                        General Comments General comments (skin integrity, edema, etc.): Educated extensively on self care, energy conservation techniques, importance of activity.    Exercises     Assessment/Plan    PT Assessment Patient needs continued PT services  PT Problem List Decreased strength;Decreased activity tolerance;Decreased balance;Decreased mobility;Cardiopulmonary status limiting activity;Pain       PT Treatment Interventions DME instruction;Gait training;Functional mobility training;Therapeutic activities;Therapeutic exercise;Balance training    PT Goals (Current goals can be found in the Care Plan section)  Acute Rehab PT Goals Patient Stated Goal: to get stronger and go home PT Goal Formulation: With patient Time For Goal Achievement: 03/20/17 Potential to Achieve Goals: Good    Frequency Min 5X/week   Barriers to discharge Decreased caregiver support      Co-evaluation               AM-PAC PT "6 Clicks" Daily Activity  Outcome Measure Difficulty turning over in bed (including adjusting bedclothes, sheets and blankets)?: A Little Difficulty moving from lying on back to sitting on the side of the bed? : A Little Difficulty sitting down on and standing up from a chair with arms (e.g., wheelchair, bedside commode, etc,.)?: A Little Help needed moving to and from a bed to chair (including a wheelchair)?: None Help needed walking in hospital room?: None Help needed climbing 3-5 steps with a railing? : None 6 Click Score: 21    End of Session Equipment Utilized During Treatment: Gait belt;Oxygen Activity Tolerance: Patient limited by fatigue Patient left: in bed;with call bell/phone within reach;with family/visitor present   PT Visit Diagnosis: Difficulty in walking, not elsewhere classified (R26.2);Muscle weakness (generalized) (M62.81)    Time:  1610-9604 PT Time Calculation (min) (ACUTE ONLY): 23 min   Charges:   PT Evaluation $PT Eval Low Complexity: 1 Low PT Treatments $Self Care/Home Management: 8-22   PT G Codes:        Charlsie Merles,  [phone]   Berton Mount 03/06/2017, 4:23 PM

## 2017-03-06 NOTE — Progress Notes (Signed)
Hypoglycemic Event  CBG: 56 at 0746  Treatment: 120 cc apple juice, 120 cc orange juice.  Symptoms: Jittery, symptoms resolved.  Follow-up CBG: [id number] CBG Result: 135   Comments/MD notified: N/A    Kenneth Fox

## 2017-03-06 NOTE — Progress Notes (Signed)
Pt stated he did not need help putting on CPAP.  He is on his home settings which are 4 cmh20 and 3L 02 added in to cpap.

## 2017-03-07 LAB — RENAL FUNCTION PANEL
ALBUMIN: 2.7 g/dL — AB (ref 3.5–5.0)
Anion gap: 10 (ref 5–15)
BUN: 46 mg/dL — AB (ref 6–20)
CALCIUM: 8.5 mg/dL — AB (ref 8.9–10.3)
CHLORIDE: 101 mmol/L (ref 101–111)
CO2: 29 mmol/L (ref 22–32)
CREATININE: 2.87 mg/dL — AB (ref 0.61–1.24)
GFR, EST AFRICAN AMERICAN: 28 mL/min — AB (ref >60)
GFR, EST NON AFRICAN AMERICAN: 24 mL/min — AB (ref >60)
Glucose, Bld: 136 mg/dL — ABNORMAL HIGH (ref 65–99)
Phosphorus: 2.7 mg/dL (ref 2.5–4.6)
Potassium: 3.6 mmol/L (ref 3.5–5.1)
SODIUM: 140 mmol/L (ref 135–145)

## 2017-03-07 LAB — GLUCOSE, CAPILLARY: GLUCOSE-CAPILLARY: 106 mg/dL — AB (ref 65–99)

## 2017-03-07 MED ORDER — HYDRALAZINE HCL 25 MG PO TABS: 25.0000 mg | ORAL_TABLET | Freq: Three times a day (TID) | ORAL | 0 refills | Status: DC

## 2017-03-07 MED ORDER — FUROSEMIDE 40 MG PO TABS: 40.0000 mg | ORAL_TABLET | Freq: Two times a day (BID) | ORAL | 0 refills | Status: DC

## 2017-03-07 MED ORDER — CLONIDINE HCL 0.1 MG PO TABS: 0.1000 mg | ORAL_TABLET | Freq: Two times a day (BID) | ORAL | 0 refills | Status: DC

## 2017-03-07 NOTE — Discharge Summary (Signed)
Physician Discharge Summary  Kenneth Fox NWG:[id number] DOB: 1971-02-17 DOA: 03/04/2017  PCP: Lindaann Pascal, PA-C  Admit date: 03/04/2017 Discharge date: 03/07/2017  Admitted From:home Disposition:home with home care  Recommendations for Outpatient Follow-up:  1. Follow up with PCP and Nephrology in 1-2 weeks 2. Please obtain BMP/CBC in one week  Home Health:yes Equipment/Devices:cpap, oxygen Discharge Condition:stable CODE STATUS:full code Diet recommendation:carb modified heart healthy  Brief/Interim Summary: 46 y.o.malewith medical history significant of morbid obesity, chronic diastolic congestive heart failure, OSA on CPAP, chronic respiratory failure with hypoxia on 3 L of oxygen at home, uncontrolled type 2 diabetes with neuropathy, chronic kidney disease stage III with baseline serum creatinine level of 2.5, multiple prior admission for congestive heart failure presented with generalized weakness and decreased by mouth intake. Patient was taking Lasix at home and metolazone was added 3 weeks prior to admission for the management of lower extremity edema by his nephrologist. In the ER patient was found to have worsening serum creatinine level associated with serum potassium level of 2.5. Admitted for further evaluation.  # Acute kidney injury on chronic kidney disease stage III likely hemodynamically mediated in the setting of Lasix and recent addition of metolazone: -Ultrasound unremarkable. Received gentle limited IV fluid with improvement in serum creatinine level. Lower the dose of Lasix to 40 mg twice a day. Discontinue metolazone. Evaluated by nephrologist. Patient clinically improved. Recommended to follow up with his nephrologist in a week to monitor lab. They verbalized understanding.  #Severe hypokalemia in the setting of diuretics and decreased oral intake: Continue oral potassium. Serum potassium level improved on discharge.  #Chronic diastolic congestive heart  failure: Patient is euvolemic and has no lower extremity edema. -Continue Lasix.  low-salt diet. -Continue hydralazine, metoprolol  #Diabetes on chronic insulin with neuropathy and gastroparesis: A1c level of 10.  -The patient was educated on low carb diet and to monitor blood sugar level. Continue current insulin regimen. Recommended to follow up with PCP. -Reglan for the management of diabetic gastroparesis -Diabetic educator referral  # OSA/ chronic respiratory failure with hypoxia: -Continue CPAP. Case manager consulted for the evaluation of problem with CPAP at home. Continue 3 L of oxygen.   # Chronic metabolic encephalopathy, generalized weakness: Due to electrolytic imbalance, renal failure, CHF. -Mentally status improved around baseline. Discussed with the patient and his wife at bedside.  #Essential hypertension: Currently on clonidine, hydralazine, metoprolol and Lasix.  the dose is reduced. Recommended to monitor blood pressure at home.  #Morbid obesity:Dietary consult   Discharge Diagnoses:  Active Problems:   Essential hypertension   Obstructive sleep apnea   Hypokalemia   Uncontrolled type 2 diabetes mellitus with diabetic neuropathy, with long-term current use of insulin (HCC)   Morbid obesity due to excess calories (HCC)   Acute kidney injury superimposed on CKD [facility])    Discharge Instructions  Discharge Instructions    Call MD for:  difficulty breathing, headache or visual disturbances    Complete by:  As directed    Call MD for:  extreme fatigue    Complete by:  As directed    Call MD for:  hives    Complete by:  As directed    Call MD for:  persistant dizziness or light-headedness    Complete by:  As directed    Call MD for:  persistant nausea and vomiting    Complete by:  As directed    Call MD for:  severe uncontrolled pain    Complete by:  As  directed    Call MD for:  temperature >100.4    Complete by:  As directed    Diet - low sodium  heart healthy    Complete by:  As directed    Diet Carb Modified    Complete by:  As directed    Increase activity slowly    Complete by:  As directed      Allergies as of 03/07/2017      Reactions   Angiotensin Receptor Blockers Other (See Comments)   Acute renal failure in patient w R heart failure   Lisinopril Other (See Comments)   Patient developed AKI after being on lisinopril for a week.      Medication List    STOP taking these medications   methocarbamol 500 MG tablet Commonly known as:  ROBAXIN   metolazone 5 MG tablet Commonly known as:  ZAROXOLYN     TAKE these medications   acetaminophen 500 MG tablet Commonly known as:  TYLENOL Take 1 tablet (500 mg total) by mouth every 6 (six) hours as needed.   cloNIDine 0.1 MG tablet Commonly known as:  CATAPRES Take 1 tablet (0.1 mg total) by mouth 2 (two) times daily. What changed:  medication strength  how much to take   furosemide 40 MG tablet Commonly known as:  LASIX Take 1 tablet (40 mg total) by mouth 2 (two) times daily. What changed:  medication strength  how much to take   hydrALAZINE 25 MG tablet Commonly known as:  APRESOLINE Take 1 tablet (25 mg total) by mouth 3 (three) times daily. What changed:  medication strength  how much to take   insulin aspart 100 UNIT/ML injection Commonly known as:  novoLOG Inject 10-20 Units into the skin 3 (three) times daily with meals. per sliding scale CBG 70 - 120: 0 units CBG 121 - 150: 2 units CBG 151 - 200: 3 units CBG 201 - 250: 5 units CBG 251 - 300: 8 units CBG 301 - 350: 11 units CBG 351 - 400: 15 units   insulin detemir 100 UNIT/ML injection Commonly known as:  LEVEMIR Inject 0.8 mLs (80 Units total) into the skin every evening.   metoCLOPramide 10 MG tablet Commonly known as:  REGLAN Take 1 tablet (10 mg total) by mouth every 6 (six) hours. What changed:  when to take this  reasons to take this   metoprolol tartrate 25 MG  tablet Commonly known as:  LOPRESSOR Take 1 tablet (25 mg total) by mouth 2 (two) times daily.   Oxycodone HCl 10 MG Tabs Take 10 mg by mouth every 6 (six) hours as needed for pain.   OXYGEN Inhale 2 L into the lungs continuous.   polyethylene glycol packet Commonly known as:  MIRALAX / GLYCOLAX Take 17 g by mouth daily as needed for mild constipation.   potassium chloride SA 20 MEQ tablet Commonly known as:  K-DUR,KLOR-CON Take 20 mEq by mouth daily.   pregabalin 150 MG capsule Commonly known as:  LYRICA Take 150 mg by mouth 3 (three) times daily.            Discharge Care Instructions        Start     Ordered   03/07/17 0000  cloNIDine (CATAPRES) 0.1 MG tablet  2 times daily     03/07/17 1039   03/07/17 0000  furosemide (LASIX) 40 MG tablet  2 times daily     03/07/17 1039   03/07/17 0000  hydrALAZINE (  APRESOLINE) 25 MG tablet  3 times daily     03/07/17 1039   03/07/17 0000  Increase activity slowly     03/07/17 1039   03/07/17 0000  Diet - low sodium heart healthy     03/07/17 1039   03/07/17 0000  Diet Carb Modified     03/07/17 1039   03/07/17 0000  Call MD for:  temperature >100.4     03/07/17 1039   03/07/17 0000  Call MD for:  persistant nausea and vomiting     03/07/17 1039   03/07/17 0000  Call MD for:  severe uncontrolled pain     03/07/17 1039   03/07/17 0000  Call MD for:  difficulty breathing, headache or visual disturbances     03/07/17 1039   03/07/17 0000  Call MD for:  hives     03/07/17 1039   03/07/17 0000  Call MD for:  persistant dizziness or light-headedness     03/07/17 1039   03/07/17 0000  Call MD for:  extreme fatigue     03/07/17 1039     Follow-up Information    Long, [name], PA-C. Schedule an appointment as soon as possible for a visit in 1 week(s).   Specialty:  Physician Assistant Contact information: [address] [place] [address] [phone]        Annie Sable, MD. Schedule an appointment  as soon as possible for a visit in 1 week(s).   Specialty:  Nephrology Contact information: [address] [place] [address] [phone]          Allergies  Allergen Reactions  . Angiotensin Receptor Blockers Other (See Comments)    Acute renal failure in patient w R heart failure  . Lisinopril Other (See Comments)    Patient developed AKI after being on lisinopril for a week.    Consultations: Nephrology  Procedures/Studies: None  Subjective: Seen and examined at bedside. Denied headache, dizziness, nausea vomiting or shortness of breath.  Discharge Exam: Vitals:   03/06/17 2115 03/07/17 0511  BP: (!) 154/92 (!) 154/87  Pulse: 66 71  Resp: 18 18  Temp: 98.4 F (36.9 C) (!) 97.5 F (36.4 C)  SpO2: 99% 92%   Vitals:   03/06/17 0521 03/06/17 1254 03/06/17 2115 03/07/17 0511  BP: (!) 144/94 (!) 143/84 (!) 154/92 (!) 154/87  Pulse: 64 (!) 58 66 71  Resp: (!) Temp: (!) 97.4 F (36.3 C) 98.1 F (36.7 C) 98.4 F (36.9 C) (!) 97.5 F (36.4 C)  TempSrc: Oral Oral Oral Oral  SpO2: 97% 100% 99% 92%  Weight:      Height:        General: Pt is alert, awake, not in acute distress Cardiovascular: RRR, S1/S2 +, no rubs, no gallops Respiratory: CTA bilaterally, no wheezing, no rhonchi Abdominal: Soft, NT, ND, bowel sounds + Extremities: no edema, no cyanosis    The results of significant diagnostics from this hospitalization (including imaging, microbiology, ancillary and laboratory) are listed below for reference.     Microbiology: No results found for this or any previous visit (from the past 240 hour(s)).   Labs: BNP (last 3 results)  Recent Labs  11/09/16 2337 01/05/17 1738 01/28/17 0949  BNP 65.6 44.9 64.8   Basic Metabolic Panel:  Recent Labs Lab 03/04/17 1553 03/04/17 2042 03/05/17 0439 03/06/17 0533 03/07/17 0442  NA 136  --  136 142 140  K 2.5*  --  3.0* 3.7 3.6  CL 93*  --  93* 103 101  CO2 30  --  35* 30 29  GLUCOSE  123*  --  211* 66 136*  BUN 69*  --  75* 62* 46*  CREATININE 4.27*  --  4.54* 3.60* 2.87*  CALCIUM 7.8*  --  8.1* 8.4* 8.5*  MG  --  1.7 1.9  --   --   PHOS  --   --   --  2.1* 2.7   Liver Function Tests:  Recent Labs Lab 03/06/17 0533 03/07/17 0442  ALBUMIN 2.5* 2.7*   No results for input(s): LIPASE, AMYLASE in the last 168 hours. No results for input(s): AMMONIA in the last 168 hours. CBC:  Recent Labs Lab 03/04/17 1553 03/05/17 0439  WBC 9.2 7.4  NEUTROABS 6.7  --   HGB 12.6* 12.0*  HCT 38.4* 37.8*  MCV 83.7 84.0  PLT 181 204   Cardiac Enzymes: No results for input(s): CKTOTAL, CKMB, CKMBINDEX, TROPONINI in the last 168 hours. BNP: Invalid input(s): POCBNP CBG:  Recent Labs Lab 03/06/17 0822 03/06/17 1250 03/06/17 1654 03/06/17 2113 03/07/17 0747  GLUCAP 135* 184* 126* 206* 106*   D-Dimer No results for input(s): DDIMER in the last 72 hours. Hgb A1c  Recent Labs  03/04/17 1553  HGBA1C 10.0*   Lipid Profile No results for input(s): CHOL, HDL, LDLCALC, TRIG, CHOLHDL, LDLDIRECT in the last 72 hours. Thyroid function studies No results for input(s): TSH, T4TOTAL, T3FREE, THYROIDAB in the last 72 hours.  Invalid input(s): FREET3 Anemia work up No results for input(s): VITAMINB12, FOLATE, FERRITIN, TIBC, IRON, RETICCTPCT in the last 72 hours. Urinalysis    Component Value Date/Time   COLORURINE YELLOW 03/04/2017 1845   APPEARANCEUR HAZY (A) 03/04/2017 1845   LABSPEC 1.012 03/04/2017 1845   PHURINE 5.0 03/04/2017 1845   GLUCOSEU NEGATIVE 03/04/2017 1845   HGBUR SMALL (A) 03/04/2017 1845   BILIRUBINUR NEGATIVE 03/04/2017 1845   KETONESUR NEGATIVE 03/04/2017 1845   PROTEINUR >=300 (A) 03/04/2017 1845   UROBILINOGEN 1.0 01/17/2015 2230   NITRITE NEGATIVE 03/04/2017 1845   LEUKOCYTESUR NEGATIVE 03/04/2017 1845   Sepsis Labs Invalid input(s): PROCALCITONIN,  WBC,  LACTICIDVEN Microbiology No results found for this or any previous visit (from  the past 240 hour(s)).   Time coordinating discharge: 32 minutes  SIGNED:   Maxie Barb, MD  [facility] 03/07/2017, 10:58 AM  If 7PM-7AM, please contact night-coverage [email or web address] Password [email or web address]

## 2017-03-07 NOTE — Progress Notes (Signed)
RT in room to follow up with pt. Regarding CPAP.  Pt. States he is not ready for CPAP at this time.

## 2017-03-07 NOTE — Progress Notes (Addendum)
Spoke with patient regarding CPAP.  Patient states he is able to place himself on /off CPAP, including O2 hook up. CPAP previous settings of 4.0 cm H20.  Pt. States this setting is correct.  Encouraged pt. To call for Respiratory when ready to place on.

## 2017-03-07 NOTE — Progress Notes (Signed)
Patient discharged to home with his family at 65. Discharge instructions provided and patient verbalized understanding. Patient stable, IV removed and intact, no signs of skin breakdown noted.

## 2017-03-22 ENCOUNTER — Ambulatory Visit: Payer: Medicare Other | Admitting: Dietician

## 2017-07-26 ENCOUNTER — Other Ambulatory Visit (HOSPITAL_BASED_OUTPATIENT_CLINIC_OR_DEPARTMENT_OTHER): Payer: Self-pay

## 2017-07-26 DIAGNOSIS — R5383 Other fatigue: Secondary | ICD-10-CM

## 2017-07-26 DIAGNOSIS — R0683 Snoring: Secondary | ICD-10-CM

## 2017-07-26 DIAGNOSIS — G4710 Hypersomnia, unspecified: Secondary | ICD-10-CM

## 2017-07-26 DIAGNOSIS — G4730 Sleep apnea, unspecified: Secondary | ICD-10-CM

## 2017-08-03 ENCOUNTER — Other Ambulatory Visit (HOSPITAL_BASED_OUTPATIENT_CLINIC_OR_DEPARTMENT_OTHER): Payer: Medicare Other

## 2018-01-08 ENCOUNTER — Encounter (HOSPITAL_COMMUNITY): Payer: Self-pay | Admitting: Emergency Medicine

## 2018-01-08 ENCOUNTER — Emergency Department (HOSPITAL_COMMUNITY): Payer: Medicare Other

## 2018-01-08 ENCOUNTER — Emergency Department (HOSPITAL_COMMUNITY)
Admission: EM | Admit: 2018-01-08 | Discharge: 2018-01-08 | Disposition: A | Discharge status: Home / Self Care | Payer: Medicare Other | Source: Home / Self Care | Attending: Emergency Medicine | Admitting: Emergency Medicine

## 2018-01-08 ENCOUNTER — Emergency Department (HOSPITAL_COMMUNITY)
Admission: EM | Admit: 2018-01-08 | Discharge: 2018-01-09 | Disposition: A | Discharge status: Home / Self Care | Payer: Medicare Other | Source: Home / Self Care | Attending: Emergency Medicine | Admitting: Emergency Medicine

## 2018-01-08 ENCOUNTER — Encounter (HOSPITAL_COMMUNITY): Payer: Self-pay

## 2018-01-08 ENCOUNTER — Other Ambulatory Visit: Payer: Self-pay

## 2018-01-08 DIAGNOSIS — Z794 Long term (current) use of insulin: Secondary | ICD-10-CM | POA: Insufficient documentation

## 2018-01-08 DIAGNOSIS — I130 Hypertensive heart and chronic kidney disease with heart failure and stage 1 through stage 4 chronic kidney disease, or unspecified chronic kidney disease: Secondary | ICD-10-CM | POA: Insufficient documentation

## 2018-01-08 DIAGNOSIS — J45909 Unspecified asthma, uncomplicated: Secondary | ICD-10-CM

## 2018-01-08 DIAGNOSIS — K3184 Gastroparesis: Secondary | ICD-10-CM

## 2018-01-08 DIAGNOSIS — Z79899 Other long term (current) drug therapy: Secondary | ICD-10-CM

## 2018-01-08 DIAGNOSIS — D573 Sickle-cell trait: Secondary | ICD-10-CM | POA: Insufficient documentation

## 2018-01-08 DIAGNOSIS — E1143 Type 2 diabetes mellitus with diabetic autonomic (poly)neuropathy: Secondary | ICD-10-CM

## 2018-01-08 DIAGNOSIS — I5033 Acute on chronic diastolic (congestive) heart failure: Secondary | ICD-10-CM | POA: Insufficient documentation

## 2018-01-08 DIAGNOSIS — E1165 Type 2 diabetes mellitus with hyperglycemia: Secondary | ICD-10-CM | POA: Insufficient documentation

## 2018-01-08 DIAGNOSIS — N189 Chronic kidney disease, unspecified: Secondary | ICD-10-CM | POA: Insufficient documentation

## 2018-01-08 DIAGNOSIS — I5032 Chronic diastolic (congestive) heart failure: Secondary | ICD-10-CM | POA: Insufficient documentation

## 2018-01-08 DIAGNOSIS — T501X1A Poisoning by loop [high-ceiling] diuretics, accidental (unintentional), initial encounter: Secondary | ICD-10-CM | POA: Diagnosis not present

## 2018-01-08 DIAGNOSIS — I110 Hypertensive heart disease with heart failure: Secondary | ICD-10-CM | POA: Insufficient documentation

## 2018-01-08 DIAGNOSIS — R1110 Vomiting, unspecified: Secondary | ICD-10-CM | POA: Diagnosis not present

## 2018-01-08 DIAGNOSIS — R739 Hyperglycemia, unspecified: Secondary | ICD-10-CM

## 2018-01-08 LAB — CBC WITH DIFFERENTIAL/PLATELET
Abs Immature Granulocytes: 0.1 10*3/uL (ref 0.0–0.1)
BASOS ABS: 0.1 10*3/uL (ref 0.0–0.1)
BASOS PCT: 0 %
EOS ABS: 0.2 10*3/uL (ref 0.0–0.7)
EOS PCT: 2 %
HCT: 42.7 % (ref 39.0–52.0)
HEMOGLOBIN: 13.3 g/dL (ref 13.0–17.0)
Immature Granulocytes: 1 %
Lymphocytes Relative: 11 %
Lymphs Abs: 1.3 10*3/uL (ref 0.7–4.0)
MCH: 25.7 pg — AB (ref 26.0–34.0)
MCHC: 31.1 g/dL (ref 30.0–36.0)
MCV: 82.6 fL (ref 78.0–100.0)
MONO ABS: 0.8 10*3/uL (ref 0.1–1.0)
Monocytes Relative: 7 %
Neutro Abs: 8.8 10*3/uL — ABNORMAL HIGH (ref 1.7–7.7)
Neutrophils Relative %: 79 %
Platelets: 256 10*3/uL (ref 150–400)
RBC: 5.17 MIL/uL (ref 4.22–5.81)
RDW: 17.4 % — ABNORMAL HIGH (ref 11.5–15.5)
WBC: 11.1 10*3/uL — AB (ref 4.0–10.5)

## 2018-01-08 LAB — COMPREHENSIVE METABOLIC PANEL
ALBUMIN: 2.8 g/dL — AB (ref 3.5–5.0)
ALBUMIN: 2.9 g/dL — AB (ref 3.5–5.0)
ALT: 20 U/L (ref 0–44)
ALT: 19 U/L (ref 0–44)
ANION GAP: 12 (ref 5–15)
AST: 17 U/L (ref 15–41)
AST: 17 U/L (ref 15–41)
Alkaline Phosphatase: 93 U/L (ref 38–126)
Alkaline Phosphatase: 96 U/L (ref 38–126)
Anion gap: 10 (ref 5–15)
BILIRUBIN TOTAL: 0.6 mg/dL (ref 0.3–1.2)
BUN: 34 mg/dL — ABNORMAL HIGH (ref 6–20)
BUN: 37 mg/dL — AB (ref 6–20)
CO2: 27 mmol/L (ref 22–32)
CO2: 29 mmol/L (ref 22–32)
Calcium: 8.5 mg/dL — ABNORMAL LOW (ref 8.9–10.3)
Calcium: 8.5 mg/dL — ABNORMAL LOW (ref 8.9–10.3)
Chloride: 102 mmol/L (ref 98–111)
Chloride: 100 mmol/L (ref 98–111)
Creatinine, Ser: 2.85 mg/dL — ABNORMAL HIGH (ref 0.61–1.24)
Creatinine, Ser: 3.35 mg/dL — ABNORMAL HIGH (ref 0.61–1.24)
GFR calc Af Amer: 23 mL/min — ABNORMAL LOW (ref >60)
GFR calc non Af Amer: 24 mL/min — ABNORMAL LOW (ref >60)
GFR, EST AFRICAN AMERICAN: 28 mL/min — AB (ref >60)
GFR, EST NON AFRICAN AMERICAN: 20 mL/min — AB (ref >60)
GLUCOSE: 232 mg/dL — AB (ref 70–99)
GLUCOSE: 290 mg/dL — AB (ref 70–99)
POTASSIUM: 4 mmol/L (ref 3.5–5.1)
POTASSIUM: 3.6 mmol/L (ref 3.5–5.1)
Sodium: 141 mmol/L (ref 135–145)
Sodium: 139 mmol/L (ref 135–145)
TOTAL PROTEIN: 6.9 g/dL (ref 6.5–8.1)
Total Bilirubin: 0.7 mg/dL (ref 0.3–1.2)
Total Protein: 7 g/dL (ref 6.5–8.1)

## 2018-01-08 LAB — BRAIN NATRIURETIC PEPTIDE: B NATRIURETIC PEPTIDE 5: 66.7 pg/mL (ref 0.0–100.0)

## 2018-01-08 LAB — URINALYSIS, ROUTINE W REFLEX MICROSCOPIC
Bilirubin Urine: NEGATIVE
Hgb urine dipstick: NEGATIVE
Ketones, ur: NEGATIVE mg/dL
Leukocytes, UA: NEGATIVE
Nitrite: NEGATIVE
Protein, ur: >=300 mg/dL — AB
Specific Gravity, Urine: 1.012 (ref 1.005–1.030)
pH: 6 (ref 5.0–8.0)

## 2018-01-08 LAB — CBC
HCT: 42.2 % (ref 39.0–52.0)
Hemoglobin: 12.8 g/dL — ABNORMAL LOW (ref 13.0–17.0)
MCH: 25.3 pg — ABNORMAL LOW (ref 26.0–34.0)
MCHC: 30.3 g/dL (ref 30.0–36.0)
MCV: 83.6 fL (ref 78.0–100.0)
PLATELETS: 256 10*3/uL (ref 150–400)
RBC: 5.05 MIL/uL (ref 4.22–5.81)
RDW: 17.3 % — AB (ref 11.5–15.5)
WBC: 11.7 10*3/uL — AB (ref 4.0–10.5)

## 2018-01-08 LAB — I-STAT CG4 LACTIC ACID, ED: LACTIC ACID, VENOUS: 1.26 mmol/L (ref 0.5–1.9)

## 2018-01-08 LAB — CBG MONITORING, ED: Glucose-Capillary: 230 mg/dL — ABNORMAL HIGH (ref 70–99)

## 2018-01-08 LAB — LIPASE, BLOOD
Lipase: 47 U/L (ref 11–51)
Lipase: 111 U/L — ABNORMAL HIGH (ref 11–51)

## 2018-01-08 MED ORDER — KETAMINE HCL 50 MG/5ML IJ SOSY
0.3000 mg/kg | PREFILLED_SYRINGE | Freq: Once | INTRAMUSCULAR | Status: AC
  Administered 2018-01-08: 44 mg via INTRAVENOUS
  Filled 2018-01-08: qty 5

## 2018-01-08 MED ORDER — IOPAMIDOL (ISOVUE-300) INJECTION 61%
INTRAVENOUS | Status: AC
  Filled 2018-01-08: qty 30

## 2018-01-08 MED ORDER — ONDANSETRON HCL 4 MG/2ML IJ SOLN
4.0000 mg | Freq: Once | INTRAMUSCULAR | Status: AC
  Administered 2018-01-08: 4 mg via INTRAVENOUS

## 2018-01-08 MED ORDER — SODIUM CHLORIDE 0.9 % IV BOLUS
1000.0000 mL | Freq: Once | INTRAVENOUS | Status: AC
  Administered 2018-01-08: 1000 mL via INTRAVENOUS

## 2018-01-08 MED ORDER — PROCHLORPERAZINE EDISYLATE 10 MG/2ML IJ SOLN
10.0000 mg | Freq: Once | INTRAMUSCULAR | Status: AC
  Administered 2018-01-08: 10 mg via INTRAVENOUS
  Filled 2018-01-08: qty 2

## 2018-01-08 MED ORDER — METOCLOPRAMIDE HCL 5 MG/ML IJ SOLN
10.0000 mg | Freq: Once | INTRAMUSCULAR | Status: AC
  Administered 2018-01-08: 10 mg via INTRAVENOUS
  Filled 2018-01-08: qty 2

## 2018-01-08 MED ORDER — METOCLOPRAMIDE HCL 10 MG PO TABS: 5.0000 mg | ORAL_TABLET | Freq: Three times a day (TID) | ORAL | 0 refills | Status: DC | PRN

## 2018-01-08 MED ORDER — ONDANSETRON HCL 4 MG/2ML IJ SOLN
4.0000 mg | Freq: Once | INTRAMUSCULAR | Status: AC
  Administered 2018-01-08: 4 mg via INTRAVENOUS
  Filled 2018-01-08: qty 2

## 2018-01-08 MED ORDER — DIPHENHYDRAMINE HCL 50 MG/ML IJ SOLN
12.5000 mg | Freq: Once | INTRAMUSCULAR | Status: AC
  Administered 2018-01-08: 12.5 mg via INTRAVENOUS
  Filled 2018-01-08: qty 1

## 2018-01-08 MED ORDER — HYDROMORPHONE HCL 1 MG/ML IJ SOLN
1.0000 mg | Freq: Once | INTRAMUSCULAR | Status: AC
  Administered 2018-01-08: 1 mg via INTRAVENOUS
  Filled 2018-01-08: qty 1

## 2018-01-08 NOTE — Discharge Instructions (Signed)
Take the medications as needed for nausea, follow-up with your primary care doctor and consider seeing a GI doctor

## 2018-01-08 NOTE — ED Triage Notes (Addendum)
Pt BIB [facility] d/t Abd pain, N/V/D that started last night. [place]  [place] Pt denies SOB & dizziness. Pt was given 4 of Zofran. Pt has Hx of Gastroparesis

## 2018-01-08 NOTE — Discharge Instructions (Addendum)
Continue your medications as previously prescribed.  Return to the ER if symptoms significantly worsen or change.

## 2018-01-08 NOTE — ED Triage Notes (Signed)
Pt was seen here last night and discharged. Hx of gastroparesis. Comes in tonight with n/v abdominal pain.  CBG 243. Pt states he took 40units of levimer at home around 1830.  Pt is on 3L [place] continuously at home. Sats from high 80s to 93% with EMS. Rhonchi heard in all fields. Hypertensive at 192/115, RR 24, sats 93% on 3L

## 2018-01-08 NOTE — ED Provider Notes (Signed)
[facility] EMERGENCY DEPARTMENT Provider Note   CSN: [id number] Arrival date & time: 01/08/18  1902     History   Chief Complaint Chief Complaint  Patient presents with  . Abdominal Pain    HPI Kenneth Fox is a 47 y.o. male.  HPI Pt has been dry heaving gagging for the last several hours.  He has been having pain in his abdomen too. He thinks it might be related to an orange he ate. The pain is in the upper abdomen.  He has had some diarrhea.  No trouble urinating.  Pt has been seen in the ED recently with similar sx.  Pt states he has a history of gastroparesis.     Past Medical History:  Diagnosis Date  . Asthma   . CHF (congestive heart failure) (HCC)   . Diabetes (HCC)   . Gastroparesis   . GERD (gastroesophageal reflux disease)   . Hypertension   . Kidney disorder   . Neuropathy   . Obesity   . Obstructive sleep apnea    will not wear CPAP  . Renal insufficiency   . Sickle cell trait [facility])     Patient Active Problem List   Diagnosis Date Noted  . Acute kidney injury superimposed on CKD (HCC) 03/04/2017  . Acute on chronic respiratory failure with hypoxia and hypercapnia (HCC) 10/26/2016  . Uncontrolled hypertension 10/26/2016  . Fluid overload 10/13/2016  . Acute on chronic diastolic CHF (congestive heart failure) (HCC) 10/13/2016  . CHF (congestive heart failure) (HCC) 10/13/2016  . Acute kidney injury (HCC) 05/09/2016  . Dysphagia 12/12/2015  . Intractable nausea and vomiting 12/11/2015  . Leukocytosis 06/02/2015  . Morbid obesity due to excess calories (HCC) 06/02/2015  . Uncontrolled type 2 diabetes mellitus with diabetic neuropathy, with long-term current use of insulin (HCC)   . Elevated troponin 06/01/2015  . AKI (acute kidney injury) (HCC) 12/03/2013  . Adverse reaction to lisinopril 12/03/2013  . Hypokalemia 11/25/2013  . Diabetic gastroparesis (HCC) 11/17/2013  . Obstructive sleep apnea 11/07/2012  . Essential  hypertension 05/18/2011    Past Surgical History:  Procedure Laterality Date  . HIP FRACTURE SURGERY Right 1999   "put a plate in"        Home Medications    Prior to Admission medications   Medication Sig Start Date End Date Taking? Authorizing Provider  acetaminophen (TYLENOL) 500 MG tablet Take 1 tablet (500 mg total) by mouth every 6 (six) hours as needed. 02/05/17   Law, Waylan Boga, PA-C  cloNIDine (CATAPRES) 0.1 MG tablet Take 1 tablet (0.1 mg total) by mouth 2 (two) times daily. 03/07/17   Maxie Barb, MD  furosemide (LASIX) 40 MG tablet Take 1 tablet (40 mg total) by mouth 2 (two) times daily. 03/07/17   Maxie Barb, MD  hydrALAZINE (APRESOLINE) 25 MG tablet Take 1 tablet (25 mg total) by mouth 3 (three) times daily. 03/07/17   Maxie Barb, MD  insulin aspart (NOVOLOG) 100 UNIT/ML injection Inject 10-20 Units into the skin 3 (three) times daily with meals. per sliding scale CBG 70 - 120: 0 units CBG 121 - 150: 2 units CBG 151 - 200: 3 units CBG 201 - 250: 5 units CBG 251 - 300: 8 units CBG 301 - 350: 11 units CBG 351 - 400: 15 units 06/06/15   Alison Murray, MD  insulin detemir (LEVEMIR) 100 UNIT/ML injection Inject 0.8 mLs (80 Units total) into the skin every evening. 12/13/15  Rai, Delene Ruffini, MD  metoCLOPramide (REGLAN) 10 MG tablet Take 0.5 tablets (5 mg total) by mouth every 8 (eight) hours as needed for nausea. 01/08/18   Linwood Dibbles, MD  metoprolol tartrate (LOPRESSOR) 25 MG tablet Take 1 tablet (25 mg total) by mouth 2 (two) times daily. 10/26/16   Dhungel, Theda Belfast, MD  Oxycodone HCl 10 MG TABS Take 10 mg by mouth every 6 (six) hours as needed for pain.    [provider]  OXYGEN Inhale 2 L into the lungs continuous.    [provider]  polyethylene glycol (MIRALAX / GLYCOLAX) packet Take 17 g by mouth daily as needed for mild constipation. 01/28/17   Benjiman Core, MD  potassium chloride SA (K-DUR,KLOR-CON) 20 MEQ tablet  Take 20 mEq by mouth daily.    [provider]  pregabalin (LYRICA) 150 MG capsule Take 150 mg by mouth 3 (three) times daily.    [provider]    Family History Family History  Problem Relation Age of Onset  . Sickle cell anemia Mother   . Heart attack Mother   . Diabetes Father   . Neuropathy Father   . Hypertension Father   . Aneurysm Father   . Kidney disease Brother   . Kidney disease Sister     Social History Social History   Tobacco Use  . Smoking status: Never Smoker  . Smokeless tobacco: Never Used  Substance Use Topics  . Alcohol use: No  . Drug use: No     Allergies   Angiotensin receptor blockers and Lisinopril   Review of Systems Review of Systems  All other systems reviewed and are negative.    Physical Exam Updated Vital Signs BP (!) 153/85   Pulse 100   Resp 18   SpO2 93%   Physical Exam  Constitutional: He appears ill. No distress.  Obese   HENT:  Head: Normocephalic and atraumatic.  Right Ear: External ear normal.  Left Ear: External ear normal.  Eyes: Conjunctivae are normal. Right eye exhibits no discharge. Left eye exhibits no discharge. No scleral icterus.  Neck: Neck supple. No tracheal deviation present.  Cardiovascular: Normal rate, regular rhythm and intact distal pulses.  Pulmonary/Chest: Effort normal and breath sounds normal. No stridor. No respiratory distress. He has no wheezes. He has no rales.  Abdominal: Soft. Bowel sounds are normal. He exhibits no distension. There is generalized tenderness. There is no rebound and no guarding.  Musculoskeletal: He exhibits no edema or tenderness.  Neurological: He is alert. He has normal strength. No cranial nerve deficit (no facial droop, extraocular movements intact, no slurred speech) or sensory deficit. He exhibits normal muscle tone. He displays no seizure activity. Coordination normal.  Skin: Skin is warm and dry. No rash noted.  Psychiatric: He has a normal  mood and affect.  Nursing note and vitals reviewed.    ED Treatments / Results  Labs (all labs ordered are listed, but only abnormal results are displayed) Labs Reviewed  LIPASE, BLOOD - Abnormal; Notable for the following components:      Result Value   Lipase 111 (*)    All other components within normal limits  COMPREHENSIVE METABOLIC PANEL - Abnormal; Notable for the following components:   Glucose, Bld 290 (*)    BUN 37 (*)    Creatinine, Ser 3.35 (*)    Calcium 8.5 (*)    Albumin 2.9 (*)    GFR calc non Af Amer 20 (*)  GFR calc Af Amer 23 (*)    All other components within normal limits  CBC - Abnormal; Notable for the following components:   WBC 11.7 (*)    Hemoglobin 12.8 (*)    MCH 25.3 (*)    RDW 17.3 (*)    All other components within normal limits  URINALYSIS, ROUTINE W REFLEX MICROSCOPIC - Abnormal; Notable for the following components:   Glucose, UA >=500 (*)    Protein, ur >=300 (*)    Bacteria, UA RARE (*)    All other components within normal limits    EKG None  Radiology Ct Abdomen Pelvis Wo Contrast  Result Date: 01/08/2018 CLINICAL DATA:  Nausea and vomiting with abdominal pain. EXAM: CT ABDOMEN AND PELVIS WITHOUT CONTRAST TECHNIQUE: Multidetector CT imaging of the abdomen and pelvis was performed following the standard protocol without IV contrast. COMPARISON:  02/04/2017 FINDINGS: Lower chest: Bibasilar collapse/consolidation noted, right greater than left. Hepatobiliary: The liver shows diffusely decreased attenuation suggesting steatosis. There is no evidence for gallstones, gallbladder wall thickening, or pericholecystic fluid. No intrahepatic or extrahepatic biliary dilation. Pancreas: No focal mass lesion. No dilatation of the main duct. No intraparenchymal cyst. No peripancreatic edema. Spleen: No splenomegaly. No focal mass lesion. Adrenals/Urinary Tract: No adrenal nodule or mass. Kidneys unremarkable. No evidence for hydroureter. The urinary  bladder appears normal for the degree of distention. Stomach/Bowel: Stomach is nondistended. No gastric wall thickening. No evidence of outlet obstruction. Duodenum is normally positioned as is the ligament of Treitz. No small bowel wall thickening. No small bowel dilatation. The terminal ileum is normal. The appendix is normal. No gross colonic mass. No colonic wall thickening. No substantial diverticular change. Vascular/Lymphatic: No abdominal aortic aneurysm. No abdominal aortic atherosclerotic calcification. There is no gastrohepatic or hepatoduodenal ligament lymphadenopathy. No intraperitoneal or retroperitoneal lymphadenopathy. No pelvic sidewall lymphadenopathy. Reproductive: The prostate gland and seminal vesicles have normal imaging features. Other: No intraperitoneal free fluid. Musculoskeletal: No worrisome lytic or sclerotic osseous abnormality. Status post ORIF right hemipelvis. Small umbilical hernia contains fat and small bowel without complicating features. IMPRESSION: 1. No acute findings in the abdomen or pelvis. No findings to explain the patient's history of nausea and vomiting with abdominal pain. Electronically Signed   By: Kennith Center M.D.   On: 01/08/2018 22:16   Dg Chest 2 View  Result Date: 01/08/2018 CLINICAL DATA:  Shortness of breath, nausea, vomiting, abdominal pain, history gastroparesis, CHF, diabetes mellitus, hypertension EXAM: CHEST - 2 VIEW COMPARISON:  02/04/2017 FINDINGS: Enlargement of cardiac silhouette. Mediastinal contours and pulmonary vascularity normal. Low lung volumes with bibasilar atelectasis. Upper lungs clear. No pleural effusion or pneumothorax. IMPRESSION: Enlargement of cardiac silhouette. Low lung volumes with bibasilar atelectasis. Electronically Signed   By: Ulyses Southward M.D.   On: 01/08/2018 20:06    Procedures Procedures (including critical care time)  Medications Ordered in ED Medications  iopamidol (ISOVUE-300) 61 % injection (has no  administration in time range)  prochlorperazine (COMPAZINE) injection 10 mg (10 mg Intravenous Given 01/08/18 2114)  diphenhydrAMINE (BENADRYL) injection 12.5 mg (12.5 mg Intravenous Given 01/08/18 2114)  ketamine 50 mg in normal saline 5 mL (10 mg/mL) syringe (44 mg Intravenous Given 01/08/18 2114)     Initial Impression / Assessment and Plan / ED Course  I have reviewed the triage vital signs and the nursing notes.  Pertinent labs & imaging results that were available during my care of the patient were reviewed by me and considered in my medical decision making (see  chart for details).  Clinical Course as of Jan 09 2336  Sat Jan 08, 2018  2049 Cr increased from last   [JK]  2050 Glucose elevated   [JK]  2335 Patient symptoms have improved.  He is now resting comfortably   [JK]    Clinical Course User Index [JK] Linwood DibblesKnapp, Gaege Sangalang, MD    Patient presented to the emergency room for evaluation of recurrent abdominal pain.  Patient was in the ED recently with similar symptoms.  Because of his persistent and recurrent pain CT scan was performed.  No acute abnormalities noted today.  Patient was treated with medications for his pain and nausea with significant improvement.  Patient was monitored in the emergency room and discharged in stable condition.  Final Clinical Impressions(s) / ED Diagnoses   Final diagnoses:  Gastroparesis  Hyperglycemia    ED Discharge Orders        Ordered    metoCLOPramide (REGLAN) 10 MG tablet  Every 8 hours PRN     01/08/18 2336       Linwood DibblesKnapp, Din Bookwalter, MD 01/08/18 2338

## 2018-01-08 NOTE — ED Provider Notes (Signed)
[facility] EMERGENCY DEPARTMENT Provider Note   CSN: [id number] Arrival date & time: 01/08/18  0058     History   Chief Complaint Chief Complaint  Patient presents with  . Abdominal Pain    HPI Kenneth Fox is a 47 y.o. male.  Patient is a 47 year old male with past medical history of obesity, diabetes, hypertension, and CHF.  He presents for evaluation of abdominal pain, nausea, and vomiting.  He has had gastroparesis in the past and this feels similar.  He denies any diarrhea or constipation.  He denies any fevers or chills.  He was recently admitted in the hospital in [place] while visiting there.  He was treated for CHF with diuresis, however left AGAINST MEDICAL ADVICE because he had to get back home.  The history is provided by the patient.  Abdominal Pain   This is a recurrent problem. The current episode started yesterday. The problem occurs constantly. The problem has been rapidly worsening. The pain is associated with an unknown factor. The pain is located in the epigastric region. The quality of the pain is cramping. The pain is severe. Associated symptoms include nausea and vomiting. Pertinent negatives include fever, melena and constipation. Nothing aggravates the symptoms. Nothing relieves the symptoms.    Past Medical History:  Diagnosis Date  . Asthma   . CHF (congestive heart failure) (HCC)   . Diabetes (HCC)   . Gastroparesis   . GERD (gastroesophageal reflux disease)   . Hypertension   . Kidney disorder   . Neuropathy   . Obesity   . Obstructive sleep apnea    will not wear CPAP  . Renal insufficiency   . Sickle cell trait [facility])     Patient Active Problem List   Diagnosis Date Noted  . Acute kidney injury superimposed on CKD (HCC) 03/04/2017  . Acute on chronic respiratory failure with hypoxia and hypercapnia (HCC) 10/26/2016  . Uncontrolled hypertension 10/26/2016  . Fluid overload 10/13/2016  . Acute on chronic diastolic  CHF (congestive heart failure) (HCC) 10/13/2016  . CHF (congestive heart failure) (HCC) 10/13/2016  . Acute kidney injury (HCC) 05/09/2016  . Dysphagia 12/12/2015  . Intractable nausea and vomiting 12/11/2015  . Leukocytosis 06/02/2015  . Morbid obesity due to excess calories (HCC) 06/02/2015  . Uncontrolled type 2 diabetes mellitus with diabetic neuropathy, with long-term current use of insulin (HCC)   . Elevated troponin 06/01/2015  . AKI (acute kidney injury) (HCC) 12/03/2013  . Adverse reaction to lisinopril 12/03/2013  . Hypokalemia 11/25/2013  . Diabetic gastroparesis (HCC) 11/17/2013  . Obstructive sleep apnea 11/07/2012  . Essential hypertension 05/18/2011    Past Surgical History:  Procedure Laterality Date  . HIP FRACTURE SURGERY Right 1999   "put a plate in"        Home Medications    Prior to Admission medications   Medication Sig Start Date End Date Taking? Authorizing Provider  acetaminophen (TYLENOL) 500 MG tablet Take 1 tablet (500 mg total) by mouth every 6 (six) hours as needed. 02/05/17   Law, Waylan Boga, PA-C  cloNIDine (CATAPRES) 0.1 MG tablet Take 1 tablet (0.1 mg total) by mouth 2 (two) times daily. 03/07/17   Maxie Barb, MD  furosemide (LASIX) 40 MG tablet Take 1 tablet (40 mg total) by mouth 2 (two) times daily. 03/07/17   Maxie Barb, MD  hydrALAZINE (APRESOLINE) 25 MG tablet Take 1 tablet (25 mg total) by mouth 3 (three) times daily. 03/07/17  Maxie Barb, MD  insulin aspart (NOVOLOG) 100 UNIT/ML injection Inject 10-20 Units into the skin 3 (three) times daily with meals. per sliding scale CBG 70 - 120: 0 units CBG 121 - 150: 2 units CBG 151 - 200: 3 units CBG 201 - 250: 5 units CBG 251 - 300: 8 units CBG 301 - 350: 11 units CBG 351 - 400: 15 units 06/06/15   Alison Murray, MD  insulin detemir (LEVEMIR) 100 UNIT/ML injection Inject 0.8 mLs (80 Units total) into the skin every evening. 12/13/15   Rai, Delene Ruffini, MD    metoCLOPramide (REGLAN) 10 MG tablet Take 1 tablet (10 mg total) by mouth every 6 (six) hours. Patient taking differently: Take 10 mg by mouth as needed.  02/05/17   Law, Waylan Boga, PA-C  metoprolol tartrate (LOPRESSOR) 25 MG tablet Take 1 tablet (25 mg total) by mouth 2 (two) times daily. 10/26/16   Dhungel, Theda Belfast, MD  Oxycodone HCl 10 MG TABS Take 10 mg by mouth every 6 (six) hours as needed for pain.    [provider]  OXYGEN Inhale 2 L into the lungs continuous.    [provider]  polyethylene glycol (MIRALAX / GLYCOLAX) packet Take 17 g by mouth daily as needed for mild constipation. 01/28/17   Benjiman Core, MD  potassium chloride SA (K-DUR,KLOR-CON) 20 MEQ tablet Take 20 mEq by mouth daily.    [provider]  pregabalin (LYRICA) 150 MG capsule Take 150 mg by mouth 3 (three) times daily.    [provider]    Family History Family History  Problem Relation Age of Onset  . Sickle cell anemia Mother   . Heart attack Mother   . Diabetes Father   . Neuropathy Father   . Hypertension Father   . Aneurysm Father   . Kidney disease Brother   . Kidney disease Sister     Social History Social History   Tobacco Use  . Smoking status: Never Smoker  . Smokeless tobacco: Never Used  Substance Use Topics  . Alcohol use: No  . Drug use: No     Allergies   Angiotensin receptor blockers and Lisinopril   Review of Systems Review of Systems  Constitutional: Negative for fever.  Gastrointestinal: Positive for abdominal pain, nausea and vomiting. Negative for constipation and melena.  All other systems reviewed and are negative.    Physical Exam Updated Vital Signs BP (!) 139/91 (BP Location: Right Arm)   Temp 97.6 F (36.4 C) (Oral)   Ht 5\' 11"  (1.803 m)   Wt (!) 148.3 kg (327 lb)   SpO2 100%   BMI 45.61 kg/m   Physical Exam  Constitutional: He is oriented to person, place, and time. He appears well-developed and  well-nourished. No distress.  HENT:  Head: Normocephalic and atraumatic.  Mouth/Throat: Oropharynx is clear and moist.  Neck: Normal range of motion. Neck supple.  Cardiovascular: Normal rate and regular rhythm. Exam reveals no friction rub.  No murmur heard. Pulmonary/Chest: Effort normal and breath sounds normal. No respiratory distress. He has no wheezes. He has no rales.  Abdominal: Soft. Bowel sounds are normal. He exhibits no distension. There is generalized tenderness. There is no rigidity, no rebound and no guarding.  Abdomen is obese.  There is generalized abdominal tenderness.  Musculoskeletal: Normal range of motion. He exhibits no edema.  Neurological: He is alert and oriented to person, place, and time. Coordination normal.  Skin: Skin is warm and dry.  He is not diaphoretic.  Nursing note and vitals reviewed.    ED Treatments / Results  Labs (all labs ordered are listed, but only abnormal results are displayed) Labs Reviewed  CBC WITH DIFFERENTIAL/PLATELET - Abnormal; Notable for the following components:      Result Value   WBC 11.1 (*)    MCH 25.7 (*)    RDW 17.4 (*)    Neutro Abs 8.8 (*)    All other components within normal limits  CBG MONITORING, ED - Abnormal; Notable for the following components:   Glucose-Capillary 230 (*)    All other components within normal limits  COMPREHENSIVE METABOLIC PANEL  LIPASE, BLOOD  URINALYSIS, ROUTINE W REFLEX MICROSCOPIC  BRAIN NATRIURETIC PEPTIDE  I-STAT CG4 LACTIC ACID, ED    EKG None  Radiology No results found.  Procedures Procedures (including critical care time)  Medications Ordered in ED Medications  HYDROmorphone (DILAUDID) injection 1 mg (has no administration in time range)  ondansetron (ZOFRAN) injection 4 mg (has no administration in time range)  metoCLOPramide (REGLAN) injection 10 mg (has no administration in time range)  sodium chloride 0.9 % bolus 1,000 mL (has no administration in time range)   ondansetron (ZOFRAN) injection 4 mg (4 mg Intravenous Given 01/08/18 0129)     Initial Impression / Assessment and Plan / ED Course  I have reviewed the triage vital signs and the nursing notes.  Pertinent labs & imaging results that were available during my care of the patient were reviewed by me and considered in my medical decision making (see chart for details).  Patient feeling better after IV fluids and pain and nausea medicine.  He has a history of gastroparesis and feels as though this was a recurrence.  His laboratory studies are reassuring.  He will be discharged, to follow-up as needed.  Final Clinical Impressions(s) / ED Diagnoses   Final diagnoses:  None    ED Discharge Orders    None       Geoffery Lyonselo, Reyan Helle, MD 01/08/18 [phone]

## 2018-01-10 ENCOUNTER — Encounter (HOSPITAL_COMMUNITY): Payer: Self-pay | Admitting: Emergency Medicine

## 2018-01-10 ENCOUNTER — Inpatient Hospital Stay (HOSPITAL_COMMUNITY)
Admission: EM | Admit: 2018-01-10 | Discharge: 2018-01-13 | DRG: 918 | Disposition: A | Discharge status: Home Health | Payer: Medicare Other | Attending: Internal Medicine | Admitting: Internal Medicine

## 2018-01-10 ENCOUNTER — Inpatient Hospital Stay (HOSPITAL_COMMUNITY): Payer: Medicare Other

## 2018-01-10 ENCOUNTER — Emergency Department (HOSPITAL_COMMUNITY): Payer: Medicare Other

## 2018-01-10 DIAGNOSIS — Z6841 Body Mass Index (BMI) 40.0 and over, adult: Secondary | ICD-10-CM | POA: Diagnosis not present

## 2018-01-10 DIAGNOSIS — E1143 Type 2 diabetes mellitus with diabetic autonomic (poly)neuropathy: Secondary | ICD-10-CM | POA: Diagnosis present

## 2018-01-10 DIAGNOSIS — K3184 Gastroparesis: Secondary | ICD-10-CM | POA: Diagnosis present

## 2018-01-10 DIAGNOSIS — E662 Morbid (severe) obesity with alveolar hypoventilation: Secondary | ICD-10-CM

## 2018-01-10 DIAGNOSIS — I248 Other forms of acute ischemic heart disease: Secondary | ICD-10-CM | POA: Diagnosis present

## 2018-01-10 DIAGNOSIS — G8929 Other chronic pain: Secondary | ICD-10-CM | POA: Diagnosis not present

## 2018-01-10 DIAGNOSIS — R809 Proteinuria, unspecified: Secondary | ICD-10-CM | POA: Diagnosis present

## 2018-01-10 DIAGNOSIS — Z9981 Dependence on supplemental oxygen: Secondary | ICD-10-CM

## 2018-01-10 DIAGNOSIS — R012 Other cardiac sounds: Secondary | ICD-10-CM | POA: Diagnosis not present

## 2018-01-10 DIAGNOSIS — I5032 Chronic diastolic (congestive) heart failure: Secondary | ICD-10-CM | POA: Diagnosis present

## 2018-01-10 DIAGNOSIS — Z888 Allergy status to other drugs, medicaments and biological substances status: Secondary | ICD-10-CM

## 2018-01-10 DIAGNOSIS — K219 Gastro-esophageal reflux disease without esophagitis: Secondary | ICD-10-CM | POA: Diagnosis present

## 2018-01-10 DIAGNOSIS — I130 Hypertensive heart and chronic kidney disease with heart failure and stage 1 through stage 4 chronic kidney disease, or unspecified chronic kidney disease: Secondary | ICD-10-CM | POA: Diagnosis present

## 2018-01-10 DIAGNOSIS — Z841 Family history of disorders of kidney and ureter: Secondary | ICD-10-CM

## 2018-01-10 DIAGNOSIS — E1121 Type 2 diabetes mellitus with diabetic nephropathy: Secondary | ICD-10-CM | POA: Diagnosis present

## 2018-01-10 DIAGNOSIS — J9611 Chronic respiratory failure with hypoxia: Secondary | ICD-10-CM | POA: Diagnosis present

## 2018-01-10 DIAGNOSIS — E872 Acidosis: Secondary | ICD-10-CM | POA: Diagnosis present

## 2018-01-10 DIAGNOSIS — N183 Chronic kidney disease, stage 3 (moderate): Secondary | ICD-10-CM | POA: Diagnosis present

## 2018-01-10 DIAGNOSIS — R0602 Shortness of breath: Secondary | ICD-10-CM

## 2018-01-10 DIAGNOSIS — Z833 Family history of diabetes mellitus: Secondary | ICD-10-CM

## 2018-01-10 DIAGNOSIS — I10 Essential (primary) hypertension: Secondary | ICD-10-CM | POA: Diagnosis present

## 2018-01-10 DIAGNOSIS — N189 Chronic kidney disease, unspecified: Secondary | ICD-10-CM | POA: Diagnosis present

## 2018-01-10 DIAGNOSIS — Z794 Long term (current) use of insulin: Secondary | ICD-10-CM | POA: Diagnosis not present

## 2018-01-10 DIAGNOSIS — I5030 Unspecified diastolic (congestive) heart failure: Secondary | ICD-10-CM

## 2018-01-10 DIAGNOSIS — T501X5A Adverse effect of loop [high-ceiling] diuretics, initial encounter: Secondary | ICD-10-CM | POA: Diagnosis present

## 2018-01-10 DIAGNOSIS — E119 Type 2 diabetes mellitus without complications: Secondary | ICD-10-CM

## 2018-01-10 DIAGNOSIS — Z9989 Dependence on other enabling machines and devices: Secondary | ICD-10-CM

## 2018-01-10 DIAGNOSIS — R1110 Vomiting, unspecified: Secondary | ICD-10-CM | POA: Diagnosis present

## 2018-01-10 DIAGNOSIS — N179 Acute kidney failure, unspecified: Secondary | ICD-10-CM

## 2018-01-10 DIAGNOSIS — E1122 Type 2 diabetes mellitus with diabetic chronic kidney disease: Secondary | ICD-10-CM | POA: Diagnosis not present

## 2018-01-10 DIAGNOSIS — E861 Hypovolemia: Secondary | ICD-10-CM | POA: Diagnosis present

## 2018-01-10 DIAGNOSIS — I272 Pulmonary hypertension, unspecified: Secondary | ICD-10-CM | POA: Diagnosis not present

## 2018-01-10 DIAGNOSIS — E1142 Type 2 diabetes mellitus with diabetic polyneuropathy: Secondary | ICD-10-CM | POA: Diagnosis present

## 2018-01-10 DIAGNOSIS — I2729 Other secondary pulmonary hypertension: Secondary | ICD-10-CM | POA: Diagnosis present

## 2018-01-10 DIAGNOSIS — Z79899 Other long term (current) drug therapy: Secondary | ICD-10-CM

## 2018-01-10 DIAGNOSIS — Z9119 Patient's noncompliance with other medical treatment and regimen: Secondary | ICD-10-CM | POA: Diagnosis not present

## 2018-01-10 DIAGNOSIS — Z8249 Family history of ischemic heart disease and other diseases of the circulatory system: Secondary | ICD-10-CM

## 2018-01-10 DIAGNOSIS — T501X1A Poisoning by loop [high-ceiling] diuretics, accidental (unintentional), initial encounter: Principal | ICD-10-CM | POA: Diagnosis present

## 2018-01-10 DIAGNOSIS — E876 Hypokalemia: Secondary | ICD-10-CM | POA: Diagnosis present

## 2018-01-10 DIAGNOSIS — G4733 Obstructive sleep apnea (adult) (pediatric): Secondary | ICD-10-CM | POA: Diagnosis present

## 2018-01-10 DIAGNOSIS — R748 Abnormal levels of other serum enzymes: Secondary | ICD-10-CM | POA: Diagnosis not present

## 2018-01-10 LAB — URINALYSIS, ROUTINE W REFLEX MICROSCOPIC
BILIRUBIN URINE: NEGATIVE
Bacteria, UA: NONE SEEN
Glucose, UA: >=500 mg/dL — AB
Ketones, ur: NEGATIVE mg/dL
Leukocytes, UA: NEGATIVE
Nitrite: NEGATIVE
PH: 5 (ref 5.0–8.0)
Protein, ur: >=300 mg/dL — AB
SPECIFIC GRAVITY, URINE: 1.01 (ref 1.005–1.030)

## 2018-01-10 LAB — BLOOD GAS, ARTERIAL
ACID-BASE EXCESS: 2.4 mmol/L — AB (ref 0.0–2.0)
Acid-Base Excess: 2.5 mmol/L — ABNORMAL HIGH (ref 0.0–2.0)
BICARBONATE: 29.1 mmol/L — AB (ref 20.0–28.0)
Bicarbonate: 29.3 mmol/L — ABNORMAL HIGH (ref 20.0–28.0)
Drawn by: 535271
Drawn by: 347191
O2 Content: 3 L/min
O2 Content: 4 L/min
O2 Saturation: 93.5 %
O2 Saturation: 93.7 %
PCO2 ART: 68.7 mmHg — AB (ref 32.0–48.0)
PCO2 ART: 70.1 mmHg — AB (ref 32.0–48.0)
PH ART: 7.248 — AB (ref 7.350–7.450)
PH ART: 7.244 — AB (ref 7.350–7.450)
PO2 ART: 78.9 mmHg — AB (ref 83.0–108.0)
Patient temperature: 98.3
Patient temperature: 98.6
pO2, Arterial: 78.7 mmHg — ABNORMAL LOW (ref 83.0–108.0)

## 2018-01-10 LAB — COMPREHENSIVE METABOLIC PANEL
ALK PHOS: 95 U/L (ref 38–126)
ALT: 23 U/L (ref 0–44)
ANION GAP: 11 (ref 5–15)
AST: 23 U/L (ref 15–41)
Albumin: 2.7 g/dL — ABNORMAL LOW (ref 3.5–5.0)
BILIRUBIN TOTAL: 0.6 mg/dL (ref 0.3–1.2)
BUN: 46 mg/dL — ABNORMAL HIGH (ref 6–20)
CALCIUM: 8 mg/dL — AB (ref 8.9–10.3)
CO2: 28 mmol/L (ref 22–32)
Chloride: 97 mmol/L — ABNORMAL LOW (ref 98–111)
Creatinine, Ser: 5.73 mg/dL — ABNORMAL HIGH (ref 0.61–1.24)
GFR, EST AFRICAN AMERICAN: 12 mL/min — AB (ref >60)
GFR, EST NON AFRICAN AMERICAN: 11 mL/min — AB (ref >60)
Glucose, Bld: 268 mg/dL — ABNORMAL HIGH (ref 70–99)
POTASSIUM: 4 mmol/L (ref 3.5–5.1)
Sodium: 136 mmol/L (ref 135–145)
TOTAL PROTEIN: 6.9 g/dL (ref 6.5–8.1)

## 2018-01-10 LAB — CBC WITH DIFFERENTIAL/PLATELET
Abs Immature Granulocytes: 0.3 10*3/uL — ABNORMAL HIGH (ref 0.0–0.1)
Basophils Absolute: 0 10*3/uL (ref 0.0–0.1)
Basophils Relative: 0 %
EOS ABS: 0 10*3/uL (ref 0.0–0.7)
EOS PCT: 0 %
HEMATOCRIT: 39.6 % (ref 39.0–52.0)
Hemoglobin: 12.4 g/dL — ABNORMAL LOW (ref 13.0–17.0)
IMMATURE GRANULOCYTES: 2 %
LYMPHS ABS: 1.4 10*3/uL (ref 0.7–4.0)
LYMPHS PCT: 10 %
MCH: 25.9 pg — ABNORMAL LOW (ref 26.0–34.0)
MCHC: 31.3 g/dL (ref 30.0–36.0)
MCV: 82.7 fL (ref 78.0–100.0)
Monocytes Absolute: 1 10*3/uL (ref 0.1–1.0)
Monocytes Relative: 8 %
NEUTROS PCT: 80 %
Neutro Abs: 10.6 10*3/uL — ABNORMAL HIGH (ref 1.7–7.7)
Platelets: 234 10*3/uL (ref 150–400)
RBC: 4.79 MIL/uL (ref 4.22–5.81)
RDW: 17.2 % — AB (ref 11.5–15.5)
WBC: 13.3 10*3/uL — AB (ref 4.0–10.5)

## 2018-01-10 LAB — TROPONIN I
TROPONIN I: 0.82 ng/mL — AB (ref <0.03)
TROPONIN I: 0.6 ng/mL — AB (ref <0.03)

## 2018-01-10 LAB — I-STAT TROPONIN, ED: Troponin i, poc: 0.81 ng/mL (ref 0.00–0.08)

## 2018-01-10 LAB — GLUCOSE, CAPILLARY
GLUCOSE-CAPILLARY: 255 mg/dL — AB (ref 70–99)
Glucose-Capillary: 198 mg/dL — ABNORMAL HIGH (ref 70–99)

## 2018-01-10 LAB — MAGNESIUM: MAGNESIUM: 2.1 mg/dL (ref 1.7–2.4)

## 2018-01-10 LAB — LIPASE, BLOOD: LIPASE: 48 U/L (ref 11–51)

## 2018-01-10 LAB — I-STAT CHEM 8, ED
BUN: 45 mg/dL — ABNORMAL HIGH (ref 6–20)
CREATININE: 5.9 mg/dL — AB (ref 0.61–1.24)
Calcium, Ion: 1.04 mmol/L — ABNORMAL LOW (ref 1.15–1.40)
Chloride: 98 mmol/L (ref 98–111)
Glucose, Bld: 270 mg/dL — ABNORMAL HIGH (ref 70–99)
HCT: 40 % (ref 39.0–52.0)
HEMOGLOBIN: 13.6 g/dL (ref 13.0–17.0)
POTASSIUM: 4 mmol/L (ref 3.5–5.1)
SODIUM: 138 mmol/L (ref 135–145)
TCO2: 26 mmol/L (ref 22–32)

## 2018-01-10 LAB — CREATININE, URINE, RANDOM: CREATININE, URINE: 103.68 mg/dL

## 2018-01-10 LAB — I-STAT CG4 LACTIC ACID, ED: LACTIC ACID, VENOUS: 1.41 mmol/L (ref 0.5–1.9)

## 2018-01-10 MED ORDER — HYDROMORPHONE HCL 1 MG/ML IJ SOLN: 1.0000 mg | Freq: Once | INTRAMUSCULAR | Status: DC

## 2018-01-10 MED ORDER — PROMETHAZINE HCL 25 MG/ML IJ SOLN
6.2500 mg | Freq: Four times a day (QID) | INTRAMUSCULAR | Status: DC | PRN
  Administered 2018-01-10: 6.25 mg via INTRAVENOUS
  Filled 2018-01-10: qty 1

## 2018-01-10 MED ORDER — METOPROLOL TARTRATE 25 MG PO TABS
25.0000 mg | ORAL_TABLET | Freq: Two times a day (BID) | ORAL | Status: DC
  Administered 2018-01-11 – 2018-01-12 (×4): 25 mg via ORAL
  Filled 2018-01-10 (×5): qty 1

## 2018-01-10 MED ORDER — ONDANSETRON HCL 4 MG/2ML IJ SOLN
4.0000 mg | Freq: Once | INTRAMUSCULAR | Status: AC
  Administered 2018-01-10: 4 mg via INTRAVENOUS
  Filled 2018-01-10: qty 2

## 2018-01-10 MED ORDER — OXYCODONE HCL 5 MG PO TABS
10.0000 mg | ORAL_TABLET | Freq: Four times a day (QID) | ORAL | Status: DC | PRN
  Administered 2018-01-10: 10 mg via ORAL
  Filled 2018-01-10 (×2): qty 2

## 2018-01-10 MED ORDER — CLONIDINE HCL 0.2 MG PO TABS
0.2000 mg | ORAL_TABLET | Freq: Two times a day (BID) | ORAL | Status: DC
  Administered 2018-01-11 – 2018-01-13 (×5): 0.2 mg via ORAL
  Filled 2018-01-10 (×5): qty 1

## 2018-01-10 MED ORDER — INSULIN DETEMIR 100 UNIT/ML ~~LOC~~ SOLN
25.0000 [IU] | Freq: Every day | SUBCUTANEOUS | Status: DC
  Filled 2018-01-10: qty 0.25

## 2018-01-10 MED ORDER — HYDRALAZINE HCL 50 MG PO TABS
50.0000 mg | ORAL_TABLET | Freq: Three times a day (TID) | ORAL | Status: DC
  Administered 2018-01-10 – 2018-01-13 (×8): 50 mg via ORAL
  Filled 2018-01-10 (×5): qty 1
  Filled 2018-01-10: qty 2
  Filled 2018-01-10 (×2): qty 1

## 2018-01-10 MED ORDER — INSULIN ASPART 100 UNIT/ML ~~LOC~~ SOLN
0.0000 [IU] | Freq: Three times a day (TID) | SUBCUTANEOUS | Status: DC
  Administered 2018-01-10: 8 [IU] via SUBCUTANEOUS
  Administered 2018-01-11 (×3): 3 [IU] via SUBCUTANEOUS
  Administered 2018-01-12: 2 [IU] via SUBCUTANEOUS
  Administered 2018-01-12: 3 [IU] via SUBCUTANEOUS
  Administered 2018-01-12: 5 [IU] via SUBCUTANEOUS
  Administered 2018-01-13: 3 [IU] via SUBCUTANEOUS

## 2018-01-10 MED ORDER — LACTATED RINGERS IV SOLN
INTRAVENOUS | Status: DC
  Administered 2018-01-10: 10:00:00 via INTRAVENOUS

## 2018-01-10 MED ORDER — HEPARIN SODIUM (PORCINE) 5000 UNIT/ML IJ SOLN
5000.0000 [IU] | Freq: Three times a day (TID) | INTRAMUSCULAR | Status: DC
  Administered 2018-01-11 – 2018-01-13 (×8): 5000 [IU] via SUBCUTANEOUS
  Filled 2018-01-10 (×8): qty 1

## 2018-01-10 MED ORDER — METOCLOPRAMIDE HCL 10 MG PO TABS: 5.0000 mg | ORAL_TABLET | Freq: Three times a day (TID) | ORAL | Status: DC | PRN

## 2018-01-10 MED ORDER — HYDROMORPHONE HCL 1 MG/ML IJ SOLN
0.5000 mg | Freq: Once | INTRAMUSCULAR | Status: AC
  Administered 2018-01-10: 0.5 mg via INTRAVENOUS
  Filled 2018-01-10: qty 1

## 2018-01-10 MED ORDER — METOCLOPRAMIDE HCL 5 MG/ML IJ SOLN: 5.0000 mg | Freq: Three times a day (TID) | INTRAMUSCULAR | Status: DC

## 2018-01-10 NOTE — Progress Notes (Signed)
1650 Pt arrived to floor via stretcher from ED. Pt very lethargic, responds to pain but easily falls back asleep. Pt able to state name and why he came to hospital before falling back asleep. Pt c/o abd pain. Gown changed and skin checked performed. Pt's wife and family at bedside, stated he wasn't that sleepy in ED. Wife stated he had pain meds recently. MD paged and new orders received for ABG and Cpap. WCTM.   1730 Critical CO2 called to RN, RN paged MD and MD stated she would come see the pt. RN asked again for BP meds but no orders received. Will continue to monitor BP and pt's status. Resp called for Cpap for pt.   1830 Cpap applied, BP still high. Family medicine MD paged x2, no return call. RN spoke to Consulting civil engineercharge RN. Charge RN, Ferdinand LangoLorinda stated she spoke to MD and at this time will only treat with oral BP meds. WCTM.

## 2018-01-10 NOTE — Consult Note (Signed)
Kenneth Fox Admit Date: 01/10/2018 01/10/2018 Arita Miss Requesting Physician:  Lynelle Doctor MD  Reason for Consult:  AoCKD HPI:  47 year old male with past medical history of diastolic heart failure, chronic oxygen dependence, type 2 diabetes complicated by microvascular disease including peripheral neuropathy and gastroparesis on metformin, OSA on CPAP, hypertension, morbid obesity who has had 3 days of nausea and vomiting with nonbloody and nonbilious emesis.  He has made several trips to the emergency room for evaluation, and presented again overnight.  At this time his labs are notable for a creatinine that increased to 5.7 from a baseline around 3, with normal potassium, bicarbonate.  It appears that there is quite a bit of confusion regarding which medications he is taking and potentially has been using both furosemide and bumetanide along with Spironolactone.  He was recently admitted to hospital in [place] where medications were adjusted.  Per the patient and family there has been no use of nonsteroidals.  Patient endorses significant thirst.  He states that there is been no significant oral intake, liquids or solids, ever since returning from his trip to [place].  This was 4 days ago.  Patient and family endorse that he continues to have edema but it is much better than usual.  No home weights are taken.  Patient sees [name] in our office.  Urine analysis here is notable for heavy proteinuria.  Renal ultrasound is pending.  Patient has been afebrile with stable oxygenation on his home 3 L.  Blood pressures have been moderately elevated.  Creatinine, Ser (mg/dL)  Date Value  16/03/9603 5.90 (H)  01/10/2018 5.73 (H)  01/08/2018 3.35 (H)  01/08/2018 2.85 (H)  03/07/2017 2.87 (H)  03/06/2017 3.60 (H)  03/05/2017 4.54 (H)  03/04/2017 4.27 (H)  02/04/2017 2.40 (H)  01/28/2017 2.33 (H)  ]  ROS NSAIDS: denies IV Contrast no exposure TMP/SMX no  exposure Hypotension not present Balance of 12 systems is negative w/ exceptions as above  PMH  Past Medical History:  Diagnosis Date  . Asthma   . CHF (congestive heart failure) (HCC)   . Diabetes (HCC)   . Gastroparesis   . GERD (gastroesophageal reflux disease)   . Hypertension   . Kidney disorder   . Neuropathy   . Obesity   . Obstructive sleep apnea    will not wear CPAP  . Renal insufficiency   . Sickle cell trait (HCC)    PSH  Past Surgical History:  Procedure Laterality Date  . HIP FRACTURE SURGERY Right 1999   "put a plate in"   FH  Family History  Problem Relation Age of Onset  . Sickle cell anemia Mother   . Heart attack Mother   . Diabetes Father   . Neuropathy Father   . Hypertension Father   . Aneurysm Father   . Kidney disease Brother   . Kidney disease Sister    SH  reports that he has never smoked. He has never used smokeless tobacco. He reports that he does not drink alcohol or use drugs. Allergies  Allergies  Allergen Reactions  . Angiotensin Receptor Blockers Other (See Comments)    Acute renal failure in patient w R heart failure  . Lisinopril Other (See Comments)    Patient developed AKI after being on lisinopril for a week.   Home medications Prior to Admission medications   Medication Sig Start Date End Date Taking? Authorizing Provider  acetaminophen (TYLENOL) 500 MG tablet Take 1 tablet (500 mg total)  by mouth every 6 (six) hours as needed. 02/05/17  Yes Law, Waylan BogaAlexandra M, PA-C  cloNIDine (CATAPRES) 0.2 MG tablet Take 0.2 mg by mouth 2 (two) times daily. 01/06/18  Yes [provider]  furosemide (LASIX) 80 MG tablet Take 80 mg by mouth 2 (two) times daily. 09/21/17  Yes [provider]  hydrALAZINE (APRESOLINE) 25 MG tablet Take 1 tablet (25 mg total) by mouth 3 (three) times daily. 03/07/17  Yes Maxie BarbBhandari, Dron Prasad, MD  insulin aspart (NOVOLOG) 100 UNIT/ML injection Inject 10-20 Units into the skin 3 (three) times daily  with meals. per sliding scale CBG 70 - 120: 0 units CBG 121 - 150: 2 units CBG 151 - 200: 3 units CBG 201 - 250: 5 units CBG 251 - 300: 8 units CBG 301 - 350: 11 units CBG 351 - 400: 15 units 06/06/15  Yes Alison Murrayevine, Alma M, MD  insulin detemir (LEVEMIR) 100 UNIT/ML injection Inject 0.8 mLs (80 Units total) into the skin every evening. Patient taking differently: Inject 40 Units into the skin 2 (two) times daily.  12/13/15  Yes Rai, Ripudeep K, MD  metFORMIN (GLUCOPHAGE) 500 MG tablet Take 500 mg by mouth 2 (two) times daily. 12/13/17  Yes [provider]  metoCLOPramide (REGLAN) 10 MG tablet Take 0.5 tablets (5 mg total) by mouth every 8 (eight) hours as needed for nausea. 01/08/18  Yes Linwood DibblesKnapp, Jon, MD  metolazone (ZAROXOLYN) 10 MG tablet Take 10 mg by mouth daily.   Yes [provider]  metoprolol tartrate (LOPRESSOR) 25 MG tablet Take 1 tablet (25 mg total) by mouth 2 (two) times daily. 10/26/16  Yes Dhungel, Nishant, MD  Oxycodone HCl 10 MG TABS Take 10 mg by mouth every 6 (six) hours as needed for pain.   Yes [provider]  OXYGEN Inhale 2 L into the lungs continuous.   Yes [provider]  polyethylene glycol (MIRALAX / GLYCOLAX) packet Take 17 g by mouth daily as needed for mild constipation. 01/28/17  Yes Benjiman CorePickering, Nathan, MD  potassium chloride SA (K-DUR,KLOR-CON) 20 MEQ tablet Take 20 mEq by mouth daily.   Yes [provider]  pregabalin (LYRICA) 150 MG capsule Take 150 mg by mouth 3 (three) times daily.   Yes [provider]  cloNIDine (CATAPRES) 0.1 MG tablet Take 1 tablet (0.1 mg total) by mouth 2 (two) times daily. Patient not taking: Reported on 01/10/2018 03/07/17   Maxie BarbBhandari, Dron Prasad, MD  furosemide (LASIX) 40 MG tablet Take 1 tablet (40 mg total) by mouth 2 (two) times daily. Patient not taking: Reported on 01/10/2018 03/07/17   Maxie BarbBhandari, Dron Prasad, MD    Current Medications Scheduled Meds: . cloNIDine  0.2 mg Oral BID  .  heparin  5,000 Units Subcutaneous Q8H  . hydrALAZINE  50 mg Oral TID  . insulin aspart  0-15 Units Subcutaneous TID WC  . insulin detemir  25 Units Subcutaneous QHS  . metoCLOPramide (REGLAN) injection  5 mg Intravenous TID WC  . metoprolol tartrate  25 mg Oral BID   Continuous Infusions: . lactated ringers 75 mL/hr at 01/10/18 1025   PRN Meds:.oxyCODONE, promethazine  CBC Recent Labs  Lab 01/08/18 0125 01/08/18 1950 01/10/18 0924 01/10/18 0933  WBC 11.1* 11.7* 13.3*  --   NEUTROABS 8.8*  --  10.6*  --   HGB 13.3 12.8* 12.4* 13.6  HCT 42.7 42.2 39.6 40.0  MCV 82.6 83.6 82.7  --   PLT 256 256 234  --  Basic Metabolic Panel Recent Labs  Lab 01/08/18 0125 01/08/18 1950 01/10/18 0924 01/10/18 0933  NA 141 139 136 138  K 4.0 3.6 4.0 4.0  CL 102 100 97* 98  CO2 27 29 28   --   GLUCOSE 232* 290* 268* 270*  BUN 34* 37* 46* 45*  CREATININE 2.85* 3.35* 5.73* 5.90*  CALCIUM 8.5* 8.5* 8.0*  --     Physical Exam  Blood pressure (!) 192/111, pulse 89, temperature 98.3 F (36.8 C), temperature source Oral, resp. rate 15, height 5\' 11"  (1.803 m), weight (!) 152.6 kg (336 lb 8 oz), SpO2 90 %. GEN: Morbidly obese, keeps eyes closed during examination, minimally interacts unless directly questioned, family in room answers most questions ENT: Thick neck, large tongue EYES: EOMI CV: RRR, normal S1 and S2, no rub PULM: Diminished in the bases bilaterally, no clear crackles or wheezing ABD: Soft, nontender, protuberant SKIN: No rashes or lesions, EXT: 2+ pitting edema into the proximal shins NEURO: Nonfocal   Assessment 47 year old male with several days of nausea and vomiting with poor oral intake likely related to a flare of diabetic gastroparesis presenting with acute on chronic renal insufficiency likely from hypovolemia compounded by use of diuretics along with ACE inhibitor.  1. AoCKD4; almost certainly diabetic nephropathy with heavy proteinuria 2. N/V; diabetic  gastroparesis 3. DM 2, microvascular disease present, peripheral neuropathy present, on metformin 4. Hypertension, labile; unable to take medicines consistently during flare of #2 5. Anemia, mild 6. OSA on CPAP 7. GERD 8. Morbid Obesity  Plan  1. Continue hydration with LR, holding diuretics 2. Hold ACE inhibitor 3. I would not continue metformin 4. Will need to simplify and educated on medication regimen as best able before discharge 5. Follow-up on renal ultrasound, low suspicion for obstruction 6. Might need dietary education for his gastroparesis and low-sodium choices 7. Daily weights, Daily Renal Panel, Strict I/Os, Avoid nephrotoxins (NSAIDs, judicious IV Contrast)    Sabra Heck MD [phone] pgr 01/10/2018, 3:35 PM

## 2018-01-10 NOTE — ED Triage Notes (Signed)
[facility]- pt coming from home with complaint of gastroparesis, seen X2 this weekend for same. Vitals stable with EMS. Pt had 4mg  of zofran IM. Vitals stable with EMS.

## 2018-01-10 NOTE — Progress Notes (Signed)
RN into check on patient.  Patient arouses to RN voice.  Once patient answers question immediately drifts back to sleep if RN no longer talking to patient.  Patient initiated on BIPAP about 2220.  RN text paged [facility] Medicine [facility] to inquire about repeat ABG post BIPAP initiation.

## 2018-01-10 NOTE — Progress Notes (Signed)
Pt was on CPAP when RT entering room with a full face mask. Pt does not wear CPAP/BIPAP at home. Per policy pt can only wear nasal mask if no sleep study has been completed. This RT attempted to put pt on nasal mask and pt refused. Pt states he is claustrophobic and can not tolerate the nasal mask. Placed pt back on [place] 3L. RN is contacting MD about pt needing BIPAP vs CPAP and a repeat ABG. ABG @1725  results:  pH 7.25 CO2 69 HCO3 29 PO2 79  RT will continue to monitor.

## 2018-01-10 NOTE — ED Notes (Signed)
Critical I-stat results given to Seward GraterMaggie, RN and Madilyn Hookees, MD

## 2018-01-10 NOTE — Progress Notes (Signed)
RN spoke with respiratory therapy pertaining to new order for STAT ABG.

## 2018-01-10 NOTE — Progress Notes (Signed)
Cpap placed after ABG confirmed with MD no Bipap at this time.

## 2018-01-10 NOTE — Progress Notes (Signed)
Paged due to concern for hypercapnia and somnolence.   The patient was resting in his bed in no acute distress upon entering the room. He was somnolent appearing at first but able to provide me with his name, DOB, [facility] [place] [place], why he was present and that he was on CPAP at home until recently due to mask issues. He denied pain or dyspnea. SpO2 was 94% on room air while lying in the right lateral decubitus position with is shoulders and head inclined to 40 degrees.   Upon reviewing the ABG I agreed that he likely needed BiPAP over CPAP given his hypercapnia as the RT had requested. The respiratory therapist promptly obtained a repeat ABG which failed to demonstrate improvement in his hypercapnia as well as hypoxia further supporting the increase in care to Stepdown, BiPAP and with close monitoring.   ABG taken following >1 hour of BiPAP 7.284/67/110/31 indicating minimal improvement in CO2 but with better oxygen delivery. RT stated she will adjust the settings. Concern for obesity hypoventilation syndrome with the elevated bicarb and CO2 in the setting of severe OSA is noted.   Lanelle BalLawrence Jazlin Tapscott, MD Internal Medicine PGY-2 Pager # [phone]

## 2018-01-10 NOTE — ED Notes (Signed)
Pt. Assisted to the bathroom

## 2018-01-10 NOTE — ED Provider Notes (Signed)
[facility] EMERGENCY DEPARTMENT Provider Note   CSN: [id number] Arrival date & time: 01/10/18  7829     History   Chief Complaint Chief Complaint  Patient presents with  . GI Problem    Gastroparesis    HPI Kenneth Fox is a 47 y.o. male.  The history is provided by the patient, the EMS personnel and medical records. No language interpreter was used.  GI Problem    Kenneth Fox is a 47 y.o. male who presents to the Emergency Department complaining of gastroparesis. He presents to the emergency department by EMS complaining of gastroparesis. He reports three days of severe generalized abdominal pain with numerous episodes of emesis and diarrhea. He had 3 to 4 episodes of dark emesis last night. He denies any hematemesis, hematochezia. He states that this is typical for his gastroparesis symptoms. He denies any fevers, chest pain, shortness of breath, dysuria. He has been out of Zofran for the last month. Past Medical History:  Diagnosis Date  . Asthma   . CHF (congestive heart failure) (HCC)   . Diabetes (HCC)   . Gastroparesis   . GERD (gastroesophageal reflux disease)   . Hypertension   . Kidney disorder   . Neuropathy   . Obesity   . Obstructive sleep apnea    will not wear CPAP  . Renal insufficiency   . Sickle cell trait [facility])     Patient Active Problem List   Diagnosis Date Noted  . Diastolic CHF (HCC) 01/10/2018  . Acute kidney injury superimposed on CKD (HCC) 03/04/2017  . Acute on chronic respiratory failure with hypoxia and hypercapnia (HCC) 10/26/2016  . Uncontrolled hypertension 10/26/2016  . Fluid overload 10/13/2016  . Acute on chronic diastolic CHF (congestive heart failure) (HCC) 10/13/2016  . CHF (congestive heart failure) (HCC) 10/13/2016  . Acute kidney injury (HCC) 05/09/2016  . Dysphagia 12/12/2015  . Intractable nausea and vomiting 12/11/2015  . Leukocytosis 06/02/2015  . Morbid obesity due to excess calories (HCC)  06/02/2015  . Uncontrolled type 2 diabetes mellitus with diabetic neuropathy, with long-term current use of insulin (HCC)   . Elevated troponin 06/01/2015  . AKI (acute kidney injury) (HCC) 12/03/2013  . Adverse reaction to lisinopril 12/03/2013  . Hypokalemia 11/25/2013  . Diabetic gastroparesis (HCC) 11/17/2013  . Obstructive sleep apnea 11/07/2012  . Essential hypertension 05/18/2011    Past Surgical History:  Procedure Laterality Date  . HIP FRACTURE SURGERY Right 1999   "put a plate in"        Home Medications    Prior to Admission medications   Medication Sig Start Date End Date Taking? Authorizing Provider  acetaminophen (TYLENOL) 500 MG tablet Take 1 tablet (500 mg total) by mouth every 6 (six) hours as needed. 02/05/17  Yes Law, Waylan Boga, PA-C  cloNIDine (CATAPRES) 0.2 MG tablet Take 0.2 mg by mouth 2 (two) times daily. 01/06/18  Yes [provider]  furosemide (LASIX) 80 MG tablet Take 80 mg by mouth 2 (two) times daily. 09/21/17  Yes [provider]  hydrALAZINE (APRESOLINE) 25 MG tablet Take 1 tablet (25 mg total) by mouth 3 (three) times daily. 03/07/17  Yes Maxie Barb, MD  insulin aspart (NOVOLOG) 100 UNIT/ML injection Inject 10-20 Units into the skin 3 (three) times daily with meals. per sliding scale CBG 70 - 120: 0 units CBG 121 - 150: 2 units CBG 151 - 200: 3 units CBG 201 - 250: 5 units CBG 251 -  300: 8 units CBG 301 - 350: 11 units CBG 351 - 400: 15 units 06/06/15  Yes Alison Murrayevine, Alma M, MD  insulin detemir (LEVEMIR) 100 UNIT/ML injection Inject 0.8 mLs (80 Units total) into the skin every evening. Patient taking differently: Inject 40 Units into the skin 2 (two) times daily.  12/13/15  Yes Rai, Ripudeep K, MD  metFORMIN (GLUCOPHAGE) 500 MG tablet Take 500 mg by mouth 2 (two) times daily. 12/13/17  Yes [provider]  metoCLOPramide (REGLAN) 10 MG tablet Take 0.5 tablets (5 mg total) by mouth every 8 (eight) hours as needed for  nausea. 01/08/18  Yes Linwood DibblesKnapp, Jon, MD  metolazone (ZAROXOLYN) 10 MG tablet Take 10 mg by mouth daily.   Yes [provider]  metoprolol tartrate (LOPRESSOR) 25 MG tablet Take 1 tablet (25 mg total) by mouth 2 (two) times daily. 10/26/16  Yes Dhungel, Nishant, MD  Oxycodone HCl 10 MG TABS Take 10 mg by mouth every 6 (six) hours as needed for pain.   Yes [provider]  OXYGEN Inhale 2 L into the lungs continuous.   Yes [provider]  polyethylene glycol (MIRALAX / GLYCOLAX) packet Take 17 g by mouth daily as needed for mild constipation. 01/28/17  Yes Benjiman CorePickering, Nathan, MD  potassium chloride SA (K-DUR,KLOR-CON) 20 MEQ tablet Take 20 mEq by mouth daily.   Yes [provider]  pregabalin (LYRICA) 150 MG capsule Take 150 mg by mouth 3 (three) times daily.   Yes [provider]  cloNIDine (CATAPRES) 0.1 MG tablet Take 1 tablet (0.1 mg total) by mouth 2 (two) times daily. Patient not taking: Reported on 01/10/2018 03/07/17   Maxie BarbBhandari, Dron Prasad, MD  furosemide (LASIX) 40 MG tablet Take 1 tablet (40 mg total) by mouth 2 (two) times daily. Patient not taking: Reported on 01/10/2018 03/07/17   Maxie BarbBhandari, Dron Prasad, MD    Family History Family History  Problem Relation Age of Onset  . Sickle cell anemia Mother   . Heart attack Mother   . Diabetes Father   . Neuropathy Father   . Hypertension Father   . Aneurysm Father   . Kidney disease Brother   . Kidney disease Sister     Social History Social History   Tobacco Use  . Smoking status: Never Smoker  . Smokeless tobacco: Never Used  Substance Use Topics  . Alcohol use: No  . Drug use: No     Allergies   Angiotensin receptor blockers and Lisinopril   Review of Systems Review of Systems  All other systems reviewed and are negative.    Physical Exam Updated Vital Signs BP (!) 188/118 (BP Location: Left Arm)   Pulse 96   Temp 98.3 F (36.8 C) (Oral)   Resp 18   Ht 5\' 11"  (1.803 m)    Wt (!) 152.6 kg (336 lb 8 oz)   SpO2 90%   BMI 46.93 kg/m   Physical Exam  Constitutional: He is oriented to person, place, and time. He appears well-developed and well-nourished. He appears distressed.  Uncomfortable appearing, moaning on the stretcher  HENT:  Head: Normocephalic and atraumatic.  Eyes:  Left conjunctival exudates, yellowish green in color without conjunctival injection  Cardiovascular: Normal rate and regular rhythm.  No murmur heard. Pulmonary/Chest: Effort normal. No respiratory distress.  Rhonchi in the left lung fields  Abdominal: Soft. There is no rebound and no guarding.  Obese abdomen with mild generalized tenderness, no guarding or rebound  Musculoskeletal: He exhibits  no tenderness.  Nonpitting edema to bilateral lower extremities  Neurological: He is alert and oriented to person, place, and time.  Skin: Skin is warm and dry.  Psychiatric: He has a normal mood and affect. His behavior is normal.  Nursing note and vitals reviewed.    ED Treatments / Results  Labs (all labs ordered are listed, but only abnormal results are displayed) Labs Reviewed  COMPREHENSIVE METABOLIC PANEL - Abnormal; Notable for the following components:      Result Value   Chloride 97 (*)    Glucose, Bld 268 (*)    BUN 46 (*)    Creatinine, Ser 5.73 (*)    Calcium 8.0 (*)    Albumin 2.7 (*)    GFR calc non Af Amer 11 (*)    GFR calc Af Amer 12 (*)    All other components within normal limits  CBC WITH DIFFERENTIAL/PLATELET - Abnormal; Notable for the following components:   WBC 13.3 (*)    Hemoglobin 12.4 (*)    MCH 25.9 (*)    RDW 17.2 (*)    Neutro Abs 10.6 (*)    Abs Immature Granulocytes 0.3 (*)    All other components within normal limits  URINALYSIS, ROUTINE W REFLEX MICROSCOPIC - Abnormal; Notable for the following components:   Glucose, UA >=500 (*)    Hgb urine dipstick SMALL (*)    Protein, ur >=300 (*)    All other components within normal limits    TROPONIN I - Abnormal; Notable for the following components:   Troponin I 0.82 (*)    All other components within normal limits  GLUCOSE, CAPILLARY - Abnormal; Notable for the following components:   Glucose-Capillary 255 (*)    All other components within normal limits  BLOOD GAS, ARTERIAL - Abnormal; Notable for the following components:   pH, Arterial 7.248 (*)    pCO2 arterial 68.7 (*)    pO2, Arterial 78.7 (*)    Bicarbonate 29.1 (*)    Acid-Base Excess 2.4 (*)    All other components within normal limits  I-STAT CHEM 8, ED - Abnormal; Notable for the following components:   BUN 45 (*)    Creatinine, Ser 5.90 (*)    Glucose, Bld 270 (*)    Calcium, Ion 1.04 (*)    All other components within normal limits  I-STAT TROPONIN, ED - Abnormal; Notable for the following components:   Troponin i, poc 0.81 (*)    All other components within normal limits  LIPASE, BLOOD  CREATININE, URINE, RANDOM  MAGNESIUM  TROPONIN I  TROPONIN I  UREA NITROGEN, URINE  HIV ANTIBODY (ROUTINE TESTING)  BASIC METABOLIC PANEL  CBC  I-STAT CG4 LACTIC ACID, ED    EKG EKG Interpretation  Date/Time:  Monday January 10 2018 09:14:35 EDT Ventricular Rate:  84 PR Interval:    QRS Duration: 84 QT Interval:  416 QTC Calculation: 492 R Axis:   76 Text Interpretation:  Sinus rhythm Low voltage, extremity leads Abnormal R-wave progression, late transition Borderline prolonged QT interval Confirmed by Tilden Fossa [phone]) on 01/10/2018 9:17:42 AM   Radiology Ct Abdomen Pelvis Wo Contrast  Result Date: 01/08/2018 CLINICAL DATA:  Nausea and vomiting with abdominal pain. EXAM: CT ABDOMEN AND PELVIS WITHOUT CONTRAST TECHNIQUE: Multidetector CT imaging of the abdomen and pelvis was performed following the standard protocol without IV contrast. COMPARISON:  02/04/2017 FINDINGS: Lower chest: Bibasilar collapse/consolidation noted, right greater than left. Hepatobiliary: The liver shows diffusely decreased  attenuation  suggesting steatosis. There is no evidence for gallstones, gallbladder wall thickening, or pericholecystic fluid. No intrahepatic or extrahepatic biliary dilation. Pancreas: No focal mass lesion. No dilatation of the main duct. No intraparenchymal cyst. No peripancreatic edema. Spleen: No splenomegaly. No focal mass lesion. Adrenals/Urinary Tract: No adrenal nodule or mass. Kidneys unremarkable. No evidence for hydroureter. The urinary bladder appears normal for the degree of distention. Stomach/Bowel: Stomach is nondistended. No gastric wall thickening. No evidence of outlet obstruction. Duodenum is normally positioned as is the ligament of Treitz. No small bowel wall thickening. No small bowel dilatation. The terminal ileum is normal. The appendix is normal. No gross colonic mass. No colonic wall thickening. No substantial diverticular change. Vascular/Lymphatic: No abdominal aortic aneurysm. No abdominal aortic atherosclerotic calcification. There is no gastrohepatic or hepatoduodenal ligament lymphadenopathy. No intraperitoneal or retroperitoneal lymphadenopathy. No pelvic sidewall lymphadenopathy. Reproductive: The prostate gland and seminal vesicles have normal imaging features. Other: No intraperitoneal free fluid. Musculoskeletal: No worrisome lytic or sclerotic osseous abnormality. Status post ORIF right hemipelvis. Small umbilical hernia contains fat and small bowel without complicating features. IMPRESSION: 1. No acute findings in the abdomen or pelvis. No findings to explain the patient's history of nausea and vomiting with abdominal pain. Electronically Signed   By: Kennith Center M.D.   On: 01/08/2018 22:16   Dg Chest 2 View  Result Date: 01/08/2018 CLINICAL DATA:  Shortness of breath, nausea, vomiting, abdominal pain, history gastroparesis, CHF, diabetes mellitus, hypertension EXAM: CHEST - 2 VIEW COMPARISON:  02/04/2017 FINDINGS: Enlargement of cardiac silhouette. Mediastinal contours  and pulmonary vascularity normal. Low lung volumes with bibasilar atelectasis. Upper lungs clear. No pleural effusion or pneumothorax. IMPRESSION: Enlargement of cardiac silhouette. Low lung volumes with bibasilar atelectasis. Electronically Signed   By: Ulyses Southward M.D.   On: 01/08/2018 20:06   US Renal  Result Date: 01/10/2018 CLINICAL DATA:  Acute renal insufficiency. EXAM: RENAL / URINARY TRACT ULTRASOUND COMPLETE COMPARISON:  None. FINDINGS: Right Kidney: Length: 13 cm. Echogenicity within normal limits. No mass or hydronephrosis visualized. Left Kidney: Length: 12.9 cm. Echogenicity within normal limits. No mass or hydronephrosis visualized. Bladder: Appears normal for degree of bladder distention. IMPRESSION: No cause for acute renal insufficiency identified. Electronically Signed   By: Gerome Sam III M.D   On: 01/10/2018 15:16   Dg Chest Port 1 View  Result Date: 01/10/2018 CLINICAL DATA:  Vomiting and shortness of breath today. History of CHF, gastroparesis, gastroesophageal reflux, diabetes EXAM: PORTABLE CHEST 1 VIEW COMPARISON:  PA and lateral chest x-ray of January 08, 2018 FINDINGS: The lungs are better inflated today. The basilar atelectasis on the left is less conspicuous. On the right persists. The interstitial markings are mildly increased. The central pulmonary vascularity is engorged and the cardiac silhouette is enlarged. IMPRESSION: CHF with mild interstitial edema slightly more conspicuous today. Persistent subsegmental atelectasis at the right lung base. Improved atelectasis on the left. Electronically Signed   By: David  [place] M.D.   On: 01/10/2018 09:27    Procedures Procedures (including critical care time) CRITICAL CARE Performed by: Tilden Fossa   Total critical care time: 45 minutes  Critical care time was exclusive of separately billable procedures and treating other patients.  Critical care was necessary to treat or prevent imminent or life-threatening  deterioration.  Critical care was time spent personally by me on the following activities: development of treatment plan with patient and/or surrogate as well as nursing, discussions with consultants, evaluation of patient's response to treatment, examination of patient,  obtaining history from patient or surrogate, ordering and performing treatments and interventions, ordering and review of laboratory studies, ordering and review of radiographic studies, pulse oximetry and re-evaluation of patient's condition.  Medications Ordered in ED Medications  lactated ringers infusion ( Intravenous New Bag/Given 01/10/18 1025)  cloNIDine (CATAPRES) tablet 0.2 mg (has no administration in time range)  hydrALAZINE (APRESOLINE) tablet 50 mg (50 mg Oral Given 01/10/18 1544)  metoprolol tartrate (LOPRESSOR) tablet 25 mg (has no administration in time range)  heparin injection 5,000 Units (has no administration in time range)  promethazine (PHENERGAN) injection 6.25 mg (6.25 mg Intravenous Given 01/10/18 1544)  metoCLOPramide (REGLAN) injection 5 mg (5 mg Intravenous Not Given 01/10/18 1708)  insulin aspart (novoLOG) injection 0-15 Units (has no administration in time range)  insulin detemir (LEVEMIR) injection 25 Units (has no administration in time range)  oxyCODONE (Oxy IR/ROXICODONE) immediate release tablet 10 mg (10 mg Oral Given 01/10/18 1544)  ondansetron (ZOFRAN) injection 4 mg (4 mg Intravenous Given 01/10/18 0950)  HYDROmorphone (DILAUDID) injection 0.5 mg (0.5 mg Intravenous Given 01/10/18 0950)     Initial Impression / Assessment and Plan / ED Course  I have reviewed the triage vital signs and the nursing notes.  Pertinent labs & imaging results that were available during my care of the patient were reviewed by me and considered in my medical decision making (see chart for details).     Patient with history of gastroparesis here for evaluation of recurrent abdominal pain, vomiting, diarrhea. He is ill  appearing on examination. On reassessment after administration of pain medications and antiemetics he appears significantly improved. Labs demonstrate acute renal failure with normal potassium. Troponin is elevated, EKG without acute ischemic changes in patient without current chest pain. Chest x-ray does show some pulmonary vascular congestion but patient is breathing comfortably on his home oxygen settings. He is edematous on examination but wife states that he is less swollen than usual. Discussed with Dr. Marisue Humble with nephrology, given recent G.I. losses concern for some degree of dehydration, will start gentle fluid hydration. Medicine consulted for admission for further treatment.  Final Clinical Impressions(s) / ED Diagnoses   Final diagnoses:  AKI (acute kidney injury) [facility])    ED Discharge Orders    None       Tilden Fossa, MD 01/10/18 1756

## 2018-01-10 NOTE — Progress Notes (Signed)
Patient remains lethargic, Per policy patient transitioned to nasal cannula per resp., patient could benefit better from Bipap, MD notified and orders received to transfer patient to stepdown for Bipap, attempted to contact wife x2 with no response. Stanford BreedBracey, Maricus Tanzi N RN 9:08 PM 01-10-2018

## 2018-01-10 NOTE — Progress Notes (Addendum)
Rapid Response Note:   I was notified by Toniann FailWendy RT of patient's condition and need for BIPAP due to obesity hypoventilation syndrome.  Joyce GrossKay RN has communicated with primary svc and they are at bedside.  Orders received for transfer to progressive care for BIPAP d/t hypercarbic resp failure.  Upon arrival, [name] was drowsy but arousable to voice.  He is oriented x4.  He denies pain and SOB but did state he was "sleepy".  He is not in distress, however due to his morbid obesity, he partially obstructs his airway and has snoring respirations when asleep and therefore becomes hypoxic intermittently until he wakes himself and takes deeper breaths.  HR 100, BP 184/105, RR 20 with sats 93% on 3L [place].  BBS clear yet diminished in the bases bilaterally.  Distant HS with murmur.  Palpable peripheral pulses and skin is warm, dry and cap refil WNL. IMTS MDs at bedside. No acute interventions by me.  Plan of care is explained with [name] and he is agreeable to BIPAP.  He will be high risk for intubation if he fails BIPAP trial. I assisted with transferring patient to [phone].

## 2018-01-10 NOTE — H&P (Signed)
Date: 01/10/2018               Patient Name:  Kenneth Fox MRN: [id number]  DOB: 05-06-1971 Age / Sex: 47 y.o., male   PCP: Lindaann Pascal, PA-C         Medical Service: Internal Medicine Teaching Service         Attending Physician: Dr. Doneen Poisson, MD    First Contact: Dr. Karilyn Cota Pager: [phone]  Second Contact: Dr. Vincente Liberty Pager: [phone]       After Hours (After 5p/  First Contact Pager: [phone]  weekends / holidays): Second Contact Pager: [phone]   Chief Complaint: nausea  History of Present Illness:  [name] is a 47yo male with PMH of dCHF, Chronic respiratory failure on 3L [place], OSA, CKD stage 3, T2DM with gastroparesis and peripheral neuropathy presenting to [facility] with N/V and abdominal pain.  His symptoms first began 3 days ago with nausea followed by intractable vomiting (nonbillious, nonbloody) and achy abdominal pain that is diffuse and associated with waves of nausea. He has had associated diarrhea (nonbloody) with about 3 episodes per day during this time as well. He and wife endorse that his leg swelling is better than it had been. Due to how sick he has been he has hardly been able to ambulate; he and his wife are unsure if his medicines he has taken the last three days have stayed down. He denies chest pain, shortness of breath, headache, vision or hearing changes, cough, orthopnea, increased oxygen requirement, rash, oral ulcers, fevers, chills; he denies dysuria, hematuria, pyuria, and reports stable volume and frequency of urination.  Patient was recently admitted to [facility] in [place] for [facility] exacerbation; during that admission his lasix was changed to Bumex, he was started on spironolactone, and lyrica was decreased to 75mg  BID. Patient and wife state that he was taking both the Bumex and Lasix at least for 1 day, but maybe more; they aren't certain about him taking spironolactone; they state he has been taking the previous dose of lyrica which is  150mg  TID.   In the ED, he was afebrile, mildly hypertensive to 148/94, satting at 96% on home 3L [place]. Labs were significant for Cr of 5.73 (b/l ~2.8), BUN 46, mild leukocytosis to 13.3, negative LA, and elevated I Stat trop to 0.81.  Meds:  Current Meds  Medication Sig  . acetaminophen (TYLENOL) 500 MG tablet Take 1 tablet (500 mg total) by mouth every 6 (six) hours as needed.  . cloNIDine (CATAPRES) 0.2 MG tablet Take 0.2 mg by mouth 2 (two) times daily.  . furosemide (LASIX) 80 MG tablet Take 80 mg by mouth 2 (two) times daily.  . hydrALAZINE (APRESOLINE) 25 MG tablet Take 1 tablet (25 mg total) by mouth 3 (three) times daily.  . insulin aspart (NOVOLOG) 100 UNIT/ML injection Inject 10-20 Units into the skin 3 (three) times daily with meals. per sliding scale CBG 70 - 120: 0 units CBG 121 - 150: 2 units CBG 151 - 200: 3 units CBG 201 - 250: 5 units CBG 251 - 300: 8 units CBG 301 - 350: 11 units CBG 351 - 400: 15 units  . insulin detemir (LEVEMIR) 100 UNIT/ML injection Inject 0.8 mLs (80 Units total) into the skin every evening. (Patient taking differently: Inject 40 Units into the skin 2 (two) times daily. )  . metFORMIN (GLUCOPHAGE) 500 MG tablet Take 500 mg by mouth 2 (two) times daily.  [name]  metoCLOPramide (REGLAN) 10 MG tablet Take 0.5 tablets (5 mg total) by mouth every 8 (eight) hours as needed for nausea.  . metolazone (ZAROXOLYN) 10 MG tablet Take 10 mg by mouth daily.  . metoprolol tartrate (LOPRESSOR) 25 MG tablet Take 1 tablet (25 mg total) by mouth 2 (two) times daily.  . Oxycodone HCl 10 MG TABS Take 10 mg by mouth every 6 (six) hours as needed for pain.  . OXYGEN Inhale 2 L into the lungs continuous.  . polyethylene glycol (MIRALAX / GLYCOLAX) packet Take 17 g by mouth daily as needed for mild constipation.  . potassium chloride SA (K-DUR,KLOR-CON) 20 MEQ tablet Take 20 mEq by mouth daily.  . pregabalin (LYRICA) 150 MG capsule Take 150 mg by mouth 3 (three) times daily.     Allergies: Allergies as of 01/10/2018 - Review Complete 01/10/2018  Allergen Reaction Noted  . Angiotensin receptor blockers Other (See Comments) 06/04/2015  . Lisinopril Other (See Comments) 12/03/2013   Past Medical History:  Diagnosis Date  . Asthma   . CHF (congestive heart failure) (HCC)   . Diabetes (HCC)   . Gastroparesis   . GERD (gastroesophageal reflux disease)   . Hypertension   . Kidney disorder   . Neuropathy   . Obesity   . Obstructive sleep apnea    will not wear CPAP  . Renal insufficiency   . Sickle cell trait (HCC)     Family History: Renal failure in sister, brother and father 2/2 DM  Social History: married; denies EtOH, tobacco or illicit drug use.  Review of Systems: A complete ROS was negative except as per HPI.   Physical Exam: Blood pressure (!) 178/97, pulse 91, temperature 98.3 F (36.8 C), temperature source Oral, resp. rate 13, height 5\' 11"  (1.803 m), weight (!) 336 lb 8 oz (152.6 kg), SpO2 95 %. GENERAL- alert, co-operative, appears as stated age, not in any distress. HEENT- Atraumatic, normocephalic, PERRL, EOMI, oral mucosa appears moist; unable to assess JVD CARDIAC- Diminished heart sounds, RRR, no murmurs, rubs or gallops. RESP- Moving equal volumes of air, and clear to auscultation bilaterally, no wheezes or crackles. ABDOMEN- Soft, nontender, bowel sounds present. NEURO- No obvious Cr N abnormality. EXTREMITIES- pulse 1+ DP, symmetric; 1-2+ pitting edema in bil LEs to knees which is symmetric w/o erythema, lesions or tenderness SKIN- Warm, dry. PSYCH- At times will fall asleep but when addressed will answer questions appropriately and moan indicating pain  EKG: personally reviewed my interpretation is SR, chronic biphasic TW in II, III; chronic TWI/flattening in aVF and V4  CXR: personally reviewed my interpretation is right lung base linear streaks of atelectasis; unable to visualize effusions, kerley B lines, pulmonary  infiltrates  Assessment & Plan by Problem: Active Problems:   Essential hypertension   Obstructive sleep apnea   Diabetic gastroparesis (HCC)   AKI (acute kidney injury) (HCC)   Uncontrolled type 2 diabetes mellitus with diabetic neuropathy, with long-term current use of insulin (HCC)   Diastolic CHF (HCC)  Gastroparesis flare: Patient with similar symptoms as prior flares of gastroparesis w/o s/sx of SBO, intra-abdominal or systemic infection, or peritonitis. He is on chronic opioids for peripheral neuropathy and it doesn't sound like he has been able to tolerate much PO intake in last few days so diarrhea might be related to opioid withdrawal. He does have a significant AKI, though his symptoms started before AKI onset and likely not related to uremia. --reglan 5mg  IV TID wc --prn phenergan 6.25mg  q6hr --  diet full liquid; advance as tolerated  AKI on CKD: Patient with baseline Cr of ~2.8 now with Cr up to 5.7 in setting of inability to tolerate PO intake and possible diuretic overuse (both bumex and lasix). He has peripheral edema but no other signs of intravascular volume overload on exam today, though exam is limited by his habitus. In the clinical setting, AKI is likely due to dehydration though intrarenal and post-renal etiologies are possible. His electrolytes are stable today. --LR 1475ml/hr --strict ins/outs --avoid nephrotoxic agents --f/u UA, FeUrea --f/u RUS --f/u AM bmet --nephrology on board and following  Elevated troponin: Troponin of 0.82 on admission; patient denies chest pain/shortness of breath. His nausea/vomiting is likely 2/2 gastroparesis rather than an anginal equivalent. EKG is unchanged from priors. Troponemia is likely demand ischemia  --trend trops; EKG in AM; telemetry --BP control as detailed below --consider repeat Echo this admission vs further ischemic workup based on troponins and re-evaluation  DCHF: Patient with peripheral edema but at this point  no evidence of intravascular volume overload or pulm edema. He has had recent medication changes and has poor health literacy so will need to re-iterate plan and any medication changes at discharge with close outpatient follow up. For now in setting of AKI which is likely pre-renal will hold diuretics. [facility]. --hold bumex, spironolactone --different reporting of home hydralazine dose - will dose at 50mg  TID for now and titrate as needed --strict in/outs, daily weights  HTN: Patient hypertensive in ED due to likely combination of several factors including clonidine withdrawal, opioid withdrawal, pain and inability to tolerate PO meds for past 3 days. His N/V are not likely to be caused by hypertensive urgency/emergency  T2DM: Patient on Levemir 40 units qhs and Novolog 10 units TID wc.  --Levemir 25 units qhs and novolog SSI-M TIDWC for now until his PO intake improves  Peripheral neuropathy: Patient on lyrica 150mg  TID and oxycodone 10mg  q6hrs prn at home. It appears that [facility] his physicians renally dosed the lyrica, however patient has gone back to taking previous dose.  --hold lyrica for now --based on current renal function when re-initiating lyrica he would be on 75mg  daily dose --continue oxy 10 q6hr prn with hold parameters; if unable to tolerate PO may need to give IV dosing temporarily to decrease withdrawal  OSA: CPAP qhs  Chronic hypoxic respiratory failure: Uncertain as to cause; was started on oxygen supplementation in 2015 during hospitalization for CHF exacerbation. He has documented OSA and possible pulmonary hypertension based on previous Echo's however due to habitus these are limited quality. He is stable on his home 3L [place] which we will continue. If he were to get more hypoxic, would be concerned about volume overload since we are administering IVF in patient with AKI on CKD and dCHF.  IVF: LR Diet: full  liq DVT ppx: heparin sq Code: Full Code  Dispo: Admit patient to Inpatient with expected length of stay greater than 2 midnights.  Signed: Nyra MarketSvalina, Keala Drum, MD 01/10/2018, 2:43 PM  IM[facility] - PGY3 Pager [phone]

## 2018-01-11 DIAGNOSIS — N183 Chronic kidney disease, stage 3 (moderate): Secondary | ICD-10-CM

## 2018-01-11 DIAGNOSIS — Z79891 Long term (current) use of opiate analgesic: Secondary | ICD-10-CM

## 2018-01-11 DIAGNOSIS — R748 Abnormal levels of other serum enzymes: Secondary | ICD-10-CM

## 2018-01-11 DIAGNOSIS — Z794 Long term (current) use of insulin: Secondary | ICD-10-CM

## 2018-01-11 DIAGNOSIS — Z9989 Dependence on other enabling machines and devices: Secondary | ICD-10-CM

## 2018-01-11 DIAGNOSIS — E662 Morbid (severe) obesity with alveolar hypoventilation: Secondary | ICD-10-CM

## 2018-01-11 DIAGNOSIS — K3184 Gastroparesis: Secondary | ICD-10-CM

## 2018-01-11 DIAGNOSIS — Z79899 Other long term (current) drug therapy: Secondary | ICD-10-CM

## 2018-01-11 DIAGNOSIS — E1143 Type 2 diabetes mellitus with diabetic autonomic (poly)neuropathy: Secondary | ICD-10-CM

## 2018-01-11 DIAGNOSIS — E1142 Type 2 diabetes mellitus with diabetic polyneuropathy: Secondary | ICD-10-CM

## 2018-01-11 DIAGNOSIS — I5032 Chronic diastolic (congestive) heart failure: Secondary | ICD-10-CM

## 2018-01-11 DIAGNOSIS — Z888 Allergy status to other drugs, medicaments and biological substances status: Secondary | ICD-10-CM

## 2018-01-11 DIAGNOSIS — G8929 Other chronic pain: Secondary | ICD-10-CM

## 2018-01-11 DIAGNOSIS — Z9981 Dependence on supplemental oxygen: Secondary | ICD-10-CM

## 2018-01-11 DIAGNOSIS — I272 Pulmonary hypertension, unspecified: Secondary | ICD-10-CM

## 2018-01-11 DIAGNOSIS — N179 Acute kidney failure, unspecified: Secondary | ICD-10-CM

## 2018-01-11 DIAGNOSIS — R012 Other cardiac sounds: Secondary | ICD-10-CM

## 2018-01-11 DIAGNOSIS — J9611 Chronic respiratory failure with hypoxia: Secondary | ICD-10-CM

## 2018-01-11 DIAGNOSIS — Z833 Family history of diabetes mellitus: Secondary | ICD-10-CM

## 2018-01-11 DIAGNOSIS — I130 Hypertensive heart and chronic kidney disease with heart failure and stage 1 through stage 4 chronic kidney disease, or unspecified chronic kidney disease: Secondary | ICD-10-CM

## 2018-01-11 DIAGNOSIS — Z841 Family history of disorders of kidney and ureter: Secondary | ICD-10-CM

## 2018-01-11 DIAGNOSIS — E1122 Type 2 diabetes mellitus with diabetic chronic kidney disease: Secondary | ICD-10-CM

## 2018-01-11 LAB — BASIC METABOLIC PANEL
Anion gap: 13 (ref 5–15)
BUN: 42 mg/dL — ABNORMAL HIGH (ref 6–20)
CO2: 27 mmol/L (ref 22–32)
Calcium: 8.2 mg/dL — ABNORMAL LOW (ref 8.9–10.3)
Chloride: 100 mmol/L (ref 98–111)
Creatinine, Ser: 4.45 mg/dL — ABNORMAL HIGH (ref 0.61–1.24)
GFR, EST AFRICAN AMERICAN: 17 mL/min — AB (ref >60)
GFR, EST NON AFRICAN AMERICAN: 14 mL/min — AB (ref >60)
Glucose, Bld: 176 mg/dL — ABNORMAL HIGH (ref 70–99)
POTASSIUM: 4.8 mmol/L (ref 3.5–5.1)
Sodium: 140 mmol/L (ref 135–145)

## 2018-01-11 LAB — TROPONIN I: Troponin I: 0.53 ng/mL (ref <0.03)

## 2018-01-11 LAB — BLOOD GAS, ARTERIAL
Acid-Base Excess: 4.5 mmol/L — ABNORMAL HIGH (ref 0.0–2.0)
Acid-Base Excess: 6 mmol/L — ABNORMAL HIGH (ref 0.0–2.0)
Bicarbonate: 30.8 mmol/L — ABNORMAL HIGH (ref 20.0–28.0)
Bicarbonate: 32 mmol/L — ABNORMAL HIGH (ref 20.0–28.0)
DELIVERY SYSTEMS: POSITIVE
DRAWN BY: 10006
Drawn by: 10006
FIO2: 50
O2 Content: 3 L/min
O2 Saturation: 92.6 %
O2 Saturation: 97.3 %
PATIENT TEMPERATURE: 98.6
PATIENT TEMPERATURE: 98.6
PO2 ART: 110 mmHg — AB (ref 83.0–108.0)
pCO2 arterial: 65.6 mmHg (ref 32.0–48.0)
pCO2 arterial: 67.1 mmHg (ref 32.0–48.0)
pH, Arterial: 7.284 — ABNORMAL LOW (ref 7.350–7.450)
pH, Arterial: 7.309 — ABNORMAL LOW (ref 7.350–7.450)
pO2, Arterial: 69.7 mmHg — ABNORMAL LOW (ref 83.0–108.0)

## 2018-01-11 LAB — GLUCOSE, CAPILLARY
GLUCOSE-CAPILLARY: 184 mg/dL — AB (ref 70–99)
GLUCOSE-CAPILLARY: 158 mg/dL — AB (ref 70–99)
Glucose-Capillary: 184 mg/dL — ABNORMAL HIGH (ref 70–99)
Glucose-Capillary: 113 mg/dL — ABNORMAL HIGH (ref 70–99)

## 2018-01-11 LAB — UREA NITROGEN, URINE: UREA NITROGEN UR: 300 mg/dL

## 2018-01-11 LAB — CBC
HEMATOCRIT: 41.1 % (ref 39.0–52.0)
Hemoglobin: 12.9 g/dL — ABNORMAL LOW (ref 13.0–17.0)
MCH: 25.9 pg — ABNORMAL LOW (ref 26.0–34.0)
MCHC: 31.4 g/dL (ref 30.0–36.0)
MCV: 82.5 fL (ref 78.0–100.0)
Platelets: 235 10*3/uL (ref 150–400)
RBC: 4.98 MIL/uL (ref 4.22–5.81)
RDW: 17.9 % — AB (ref 11.5–15.5)
WBC: 13.8 10*3/uL — AB (ref 4.0–10.5)

## 2018-01-11 LAB — HIV ANTIBODY (ROUTINE TESTING W REFLEX): HIV SCREEN 4TH GENERATION: NONREACTIVE

## 2018-01-11 LAB — MRSA PCR SCREENING: MRSA by PCR: NEGATIVE

## 2018-01-11 MED ORDER — ONDANSETRON HCL 4 MG/2ML IJ SOLN
4.0000 mg | Freq: Once | INTRAMUSCULAR | Status: AC
  Administered 2018-01-11: 4 mg via INTRAVENOUS
  Filled 2018-01-11: qty 2

## 2018-01-11 MED ORDER — INSULIN DETEMIR 100 UNIT/ML ~~LOC~~ SOLN
10.0000 [IU] | Freq: Every day | SUBCUTANEOUS | Status: DC
  Administered 2018-01-11 – 2018-01-12 (×2): 10 [IU] via SUBCUTANEOUS
  Filled 2018-01-11 (×2): qty 0.1

## 2018-01-11 MED ORDER — HYDRALAZINE HCL 20 MG/ML IJ SOLN: 10.0000 mg | Freq: Three times a day (TID) | INTRAMUSCULAR | Status: DC | PRN

## 2018-01-11 MED ORDER — METOCLOPRAMIDE HCL 5 MG PO TABS
5.0000 mg | ORAL_TABLET | Freq: Three times a day (TID) | ORAL | Status: DC
  Administered 2018-01-11 – 2018-01-13 (×7): 5 mg via ORAL
  Filled 2018-01-11 (×7): qty 1

## 2018-01-11 MED ORDER — METOCLOPRAMIDE HCL 5 MG/ML IJ SOLN
5.0000 mg | Freq: Three times a day (TID) | INTRAMUSCULAR | Status: DC
  Administered 2018-01-11 (×3): 5 mg via INTRAVENOUS
  Filled 2018-01-11 (×3): qty 2

## 2018-01-11 MED ORDER — PROMETHAZINE HCL 25 MG/ML IJ SOLN
6.2500 mg | Freq: Four times a day (QID) | INTRAMUSCULAR | Status: DC | PRN
Start: 2018-01-11 — End: 2018-01-11

## 2018-01-11 NOTE — Progress Notes (Signed)
Admit: 01/10/2018 LOS: 351  47 year old male with several days of nausea and vomiting with poor oral intake likely related to a flare of diabetic gastroparesis presenting with acute on chronic renal insufficiency likely from hypovolemia compounded by use of diuretics along with ACE inhibitor.  Subjective:  . Tolerating PO, queasy but no N/V . SCr improving with hydration . BP up still . No sig inc in edema   08/05 0701 - 08/06 0700 In: 739.1 [P.O.:120; I.V.:619.1] Out: 1150 [Urine:1150]  Filed Weights   01/10/18 0926 01/10/18 1014 01/11/18 0334  Weight: (!) 148.3 kg (327 lb) (!) 152.6 kg (336 lb 8 oz) (!) 149.2 kg (328 lb 14.8 oz)    Scheduled Meds: . cloNIDine  0.2 mg Oral BID  . heparin  5,000 Units Subcutaneous Q8H  . hydrALAZINE  50 mg Oral TID  . insulin aspart  0-15 Units Subcutaneous TID WC  . insulin detemir  10 Units Subcutaneous QHS  . metoCLOPramide (REGLAN) injection  5 mg Intravenous TID WC  . metoprolol tartrate  25 mg Oral BID   Continuous Infusions: . lactated ringers 75 mL/hr at 01/10/18 1025   PRN Meds:.hydrALAZINE, oxyCODONE  Current Labs: reviewed    Physical Exam:  Blood pressure 102/74, pulse 87, temperature 98.4 F (36.9 C), temperature source Oral, resp. rate 18, height 5\' 11"  (1.803 m), weight (!) 149.2 kg (328 lb 14.8 oz), SpO2 97 %. GEN: Morbidly obese, keeps eyes closed during examination, minimally interacts unless directly questioned, family in room answers most questions ENT: Thick neck, large tongue EYES: EOMI CV: RRR, normal S1 and S2, no rub PULM: Diminished in the bases bilaterally, no clear crackles or wheezing ABD: Soft, nontender, protuberant SKIN: No rashes or lesions, EXT: 2+ pitting edema into the proximal shins NEURO: Nonfocal  A 1. AoCKD4; almost certainly diabetic nephropathy with heavy proteinuria 2. N/V; diabetic gastroparesis flare 3. DM 2, microvascular disease present, peripheral neuropathy present, on  metformin 4. Hypertension, labile; unable to take medicines consistently during flare of #2 5. Anemia, mild 6. OSA on CPAP 7. GERD 8. Morbid Obesity  P . Cont hydration at this time . Would not restart RAAS inh at discharge, [name] can consider as outpt if N/V/gastroparesis issues are stable . Maybe amlodipine next best choice if doesn't worsen edema, per primary . Daily weights, Daily Renal Panel, Strict I/Os, Avoid nephrotoxins (NSAIDs, judicious IV Contrast)  . Will sign off for now, Please call with any questions or concerns   Sabra Heckyan Sanford MD 01/11/2018, 12:26 PM  Recent Labs  Lab 01/08/18 1950 01/10/18 0924 01/10/18 0933 01/11/18 0602  NA 139 136 138 140  K 3.6 4.0 4.0 4.8  CL 100 97* 98 100  CO2 29 28  --  27  GLUCOSE 290* 268* 270* 176*  BUN 37* 46* 45* 42*  CREATININE 3.35* 5.73* 5.90* 4.45*  CALCIUM 8.5* 8.0*  --  8.2*   Recent Labs  Lab 01/08/18 0125 01/08/18 1950 01/10/18 0924 01/10/18 0933 01/11/18 0602  WBC 11.1* 11.7* 13.3*  --  13.8*  NEUTROABS 8.8*  --  10.6*  --   --   HGB 13.3 12.8* 12.4* 13.6 12.9*  HCT 42.7 42.2 39.6 40.0 41.1  MCV 82.6 83.6 82.7  --  82.5  PLT 256 256 234  --  235

## 2018-01-11 NOTE — Progress Notes (Signed)
Patient taken on BiPAP at this time per his request. Placed back on 3 LNC. SAT 97%. RN aware that he is off and wants to get up 

## 2018-01-11 NOTE — Progress Notes (Signed)
Internal Medicine Attending Admission Note Date: 01/11/2018  Patient name: Kenneth Fox Medical record number: [id number] Date of birth: May 15, 1971 Age: 47 y.o. Gender: male  I saw and evaluated the patient. I reviewed the resident's note and I agree with the resident's findings and plan as documented in the resident's note.  Chief Complaint(s): Nausea and vomiting  History - key components related to admission:  Kenneth Fox is a 47 year old man with morbid obesity complicated by obstructive sleep apnea with associated pulmonary hypertension, diabetes complicated by gastroparesis and peripheral neuropathy, obesity hypoventilation syndrome, and chronic abdominal pain treated with chronic narcotics who presents with a three-day history of nausea, nonbilious, nonbloody vomitus, and abdominal pain that is characterized as an ache. With this illness he is unclear if he's been able to keep down his medications over the last 3 days. He denies any chest pain, shortness of breath, changes in his baseline oxygen requirements, or dysuria. Interestingly, his peripheral edema is much better than usual. It also should be noted that he recently left the [facility] inpatient service AGAINST MEDICAL ADVICE to travel back to [place] after visiting [place]. There he apparently had an acute on chronic diastolic heart failure episode and his Lasix was changed to Bumex. Secondary to some confusion it appears he was taking both the Lasix and Bumex. In the emergency department his creatinine was noted to be 5.7 so he was admitted to the internal medicine teaching service for further evaluation and care.   When seen on rounds the morning following admission he was feeling slightly better with decreased nausea and vomiting.  Physical Exam - key components related to admission:  Vitals:   01/11/18 1255 01/11/18 1300 01/11/18 1325 01/11/18 1355  BP: 99/71  96/71 101/77  Pulse:      Resp: 16  15 15    Temp:  (!) 97.5 F (36.4 C)    TempSrc:  Axillary    SpO2: (!) 89%  95% 98%  Weight:      Height:       Gen.: Well-developed, well-nourished, morbidly obese man lying comfortably in bed in no acute distress. Heart: Distant heart sounds without appreciable murmurs, rubs, or gallops.  His S2 was physiologically split. Abdomen: morbidly obese, soft, nontender, without guarding or rebound. Extremities: Without pitting edema.  Lab results:  Basic Metabolic Panel: Recent Labs    01/10/18 0924 01/10/18 0933 01/10/18 1302 01/11/18 0602  NA 136 138  --  140  K 4.0 4.0  --  4.8  CL 97* 98  --  100  CO2 28  --   --  27  GLUCOSE 268* 270*  --  176*  BUN 46* 45*  --  42*  CREATININE 5.73* 5.90*  --  4.45*  CALCIUM 8.0*  --   --  8.2*  MG  --   --  2.1  --    Liver Function Tests: Recent Labs    01/08/18 1950 01/10/18 0924  AST 17 23  ALT 19 23  ALKPHOS 96 95  BILITOT 0.7 0.6  PROT 7.0 6.9  ALBUMIN 2.9* 2.7*   Recent Labs    01/08/18 1950 01/10/18 0924  LIPASE 111* 48   CBC: Recent Labs    01/10/18 0924 01/10/18 0933 01/11/18 0602  WBC 13.3*  --  13.8*  NEUTROABS 10.6*  --   --   HGB 12.4* 13.6 12.9*  HCT 39.6 40.0 41.1  MCV 82.7  --  82.5  PLT 234  --  235   Cardiac Enzymes: Recent Labs    01/10/18 1152 01/10/18 1722 01/10/18 2356  TROPONINI 0.82* 0.60* 0.53*   CBG: Recent Labs    01/10/18 1659 01/10/18 2301 01/11/18 0726 01/11/18 1117  GLUCAP 255* 198* 184* 184*   Urinalysis:  Clear, yellow, specific gravity 1.010, pH 5.0, glucose greater 500, hemoglobin small, protein greater than 300, nitrite negative, leukocytes negative, rbc's 0-5 per high-power field, white blood cells 0-5 per high-power field.  Misc. Labs:  Lactic acid 1.41 Urine urea nitrogen 300 Urine creatinine 103.7 HIV antibody nonreactive ABG on FiO2 3 L per nasal cannula 7.25/69/79 ABG on FiO2 4 L per nasal cannula 7.24/71/79 ABG on BiPAP with an FiO2 of 0.5  7.28/67/110 ABG on FiO2 3 L per nasal cannula 7.31/66/70  Imaging results:   AP portable chest x-ray: Personally reviewed. Improving right basilar atelectasis compared with the film upon admission. No effusions or masses.  Renal ultrasound: Personally reviewed. No evidence of obstruction.  Other results:  EKG: Personally reviewed. Normal sinus rhythm at 94 bpm, normal axis, normal intervals, no significant Q waves, no LVH by voltage, low voltage throughout, poor R wave progression, no ST or T-wave changes. There are no significant changes from the previous ECG on 01/08/2018 or the subsequent ECG on 01/11/2018.  Assessment & Plan by Problem:  Kenneth Fox is a 47 year old man with morbid obesity complicated by obstructive sleep apnea with associated pulmonary hypertension, diabetes complicated by gastroparesis and peripheral neuropathy, obesity hypoventilation syndrome, and chronic abdominal pain treated with chronic narcotics who presents with a three-day history of nausea, nonbilious, nonbloody vomitus, and abdominal pain that is characterized as an ache.  The cause of his nausea and vomiting are likely related to an exacerbation of his chronic diabetic gastroparesis. There is also likely a component of acute uremia from his acute on chronic renal failure. The cause of the acute on chronic renal failure is related to taking 2 loop diuretics secondary to confusion [facility] and the decreased oral intake associated with worsening nausea and vomiting. This was a vicious cycle. He has improved with gentle hydration and anti-emetics and prokinetics. The cause of the mild elevation in troponin, which is decreasing throughout the admission, is a bit more unclear. I suspect with his untreated obstructive sleep apnea and significant hypoxemia with an underlying obesity hypoventilation syndrome he has worsened his pulmonary hypertension and right heart failure. In the setting of acute on  chronic renal failure he has failed to clear the troponin associated with this demand ischemia but the troponin levels are now falling given the improvement in his glomerular filtration rate. Thus, it is my opinion the troponins are more a result of demand ischemia associated with hypoxemia and hypoventilation with increased pulmonary hypertension and right heart failure rather than from an obstructive cardiomyopathy presenting as nausea and vomiting as an anginal equivalent. Given this, he will require better management of his obstructive sleep apnea and obesity hypoventilation syndrome.  1) Acute on chronic renal failure: We will continue with the gentle IV hydration given his underlying diastolic dysfunction. We will continue to follow serial creatinines every morning. If his creatinine is approaching his baseline he should be at a point where intravenous resuscitation has achieved its goal and further hydration can occur orally.  2) Diabetic gastroparesis: We will continue with the metoclopramide as an inpatient, but defer long-term therapy to his primary care provider. We will continue with the Zofran as an inpatient for the nausea and  vomiting, as well as continue it as an outpatient, since he was getting this medication prior to admission for the chronic nausea and vomiting. While an inpatient we will ask dietary to provide him with education regarding an appropriate diet for someone with diabetic gastroparesis. On rounds this morning, the importance of small frequent meals was stressed.  3) Obstructive sleep apnea and obesity hypoventilation syndrome: He states he has not been wearing his full face mask because his mask is broken. He states that he has an appointment with his sleep apnea physician but we will need to confirm this and make sure his primary care provider orders this if this has not been scheduled. I anticipate premature death if he does not get his obstructive sleep apnea under control  given the fact that I believe it is causing significant strain on his right heart and pulmonary artery pressures. Finding an appropriate mask and educating him on the importance of treating his obstructive sleep apnea given his morbid obesity, pulmonary hypertension, and obesity hypoventilation syndrome is paramount to his long-term survival.  4) Disposition: If his nausea and vomiting are improved, and he is able to maintain oral hydration, and his creatinine is approaching his baseline, I suspect he will be ready for discharge with outpatient follow-up in his primary care provider's office as well as with his sleep physician. This is likely to occur within the next 24-48 hours.

## 2018-01-11 NOTE — Progress Notes (Signed)
ABG has resulted, RN text paged Internal Medicine Residency with results.

## 2018-01-11 NOTE — Progress Notes (Signed)
Patient stated he needed to have a bowel movement and stated he wanted to walk into the bathroom.  RN educated patient to walk into bathroom at this time due to respiratory status, being on BIPAP and due to lethargy it was not in patient's best interest.  RN offered bed pan and patient refused.  RN offered bedside commode and patient stated he could walk into the bathroom.  RN explained to patient he might feel like he could walk into the bathroom but due to his breathing and drowsiness it was not in his best interest.  Patient attempting to get out of bed, RN obtained bedside commode and with another staff member assisted patient to bedside commode.  Patient unsteady.  Patient had bowel movement, once back in bed patient complaining of nausea and was dry heaving.  BIPAP removed, 3L nasal cannula applied and Internal Medicine Residency paged and was updated.  Order entered per on call for Zofran.  RN called Respiratory pertaining to patient's 0300 ordered ABG.  Zofran administered per MD order.  Patient able to get current phone number for wife from cell phone.  RN spoke with Glenis SmokerShakima and updated her on patient's status.  Phone number updated in patient's chart for wife.

## 2018-01-11 NOTE — Progress Notes (Signed)
RT called RN with pt ABG critical values.  PH 7.24 CO2 70.1  PO2 79 HCO3 29  MD has ordered BIPAP and pt is being transferred to a progressive care unit for BIPAP management.

## 2018-01-11 NOTE — Progress Notes (Signed)
Spoke with Internal Medicine Residency pertaining to patient's latest ABG results also patient continuing to complain of nausea, complaining of hiccups and is dry heaving.  Order entered per MD.

## 2018-01-11 NOTE — Progress Notes (Signed)
RT called to assess patient for BiPAP. When I arrived, he was sleeping, increased RR and SAT 83%. Placed back on BiPAP per previous settings. RT to monitor.

## 2018-01-11 NOTE — Progress Notes (Signed)
Internal Medicine Attending  Date: 01/11/2018  Patient name: Kenneth Fox Medical record number: [id number] Date of birth: January 15, 1971 Age: 47 y.o. Gender: male  I saw and evaluated the patient. I reviewed the resident's note by Dr. Karilyn Cotaehman and I agree with the resident's findings and plans as documented in her progress note.  Please see my H&P dated 01/11/2018 and attached to Dr. Kandice HamsSvalina's H&P dated 01/10/2018 for the specifics of my evaluation, assessment, plan from earlier in the day.

## 2018-01-11 NOTE — Progress Notes (Signed)
Subjective: [name] reported feeling better today. He said he is still having some abdominal pain in his stomach and nausea but has not had any more episodes of vomiting. He said he is not feeling thirsty today. He denied any history of CAD but said he follows a cardiologist right across the hospital. He also informed us his CPAP mask was broken and he was waiting on the mail order for his new one. He [facility] but could not remember his name either. He is tolerating PO well.   Objective:  Vital signs in last 24 hours: Vitals:   01/11/18 1255 01/11/18 1300 01/11/18 1325 01/11/18 1355  BP: 99/71  96/71 101/77  Pulse:      Resp: 16  15 15   Temp:  (!) 97.5 F (36.4 C)    TempSrc:  Axillary    SpO2: (!) 89%  95% 98%  Weight:      Height:       Physical Exam:  General- seen laying in bed, in no acute distress on nasal cannula, much more comfortable today Cardio- distant heart sounds, no murmurs Abdomen- soft, non-tender, no guarding Extremities- no pitting edema   Assessment/Plan:  Active Problems:   Essential hypertension   Obstructive sleep apnea   Diabetic gastroparesis (HCC)   Acute on chronic renal failure (HCC)   Uncontrolled type 2 diabetes mellitus with diabetic neuropathy, with long-term current use of insulin (HCC)   Diastolic CHF (HCC)   Obesity hypoventilation syndrome (HCC)   Morbid obesity (HCC)   Chronic diastolic heart failure (HCC)  [name] is a 47 year old man with a medical history significant for OSA associated with pulmonary hypertension, morbid obesity, diabetes, gastroparesis, diastolic CHF and peripheral neuropathy treated with chronic opioids presenting with nausea, nonbilious nonbloody vomiting and diffuse abdominal pain. His nausea and abdominal pain have improved, he is also tolerating PO intake.  Acute on chronic renal failure: Patient has a baseline Cr of 2.8 and was 5.7 on admission in the setting of decreased PO  intake and diuretic overuse (bumex and lasix). His Cr is trending down at 4.45 today. He has lower extremity edema with no pitting. Electrolytes are stable today.   -Continue 75 ml/hr LR  -continue to hold diuretics  -f/u bmp  -nephrology on board and following   Diabetic gastroparesis: [name]'s symptoms are similar to previous gastroparesis flares with no signs of SBO, infection or peritonitis. He is on chronic opioids for peripheral neuropathy. He did not have good PO intake the last few days leading up to this episode and had some diarrhea most likely due to opioid withdrawal. These symptoms started before AKI onset so not likely related to uremia.   -Continue reglan 5 mg PO TID  -advance diet as tolerated   Obstructive sleep apnea and obesity hypoventilation syndrome: He was started on oxygen supplementation in 2015 during a hospitalization for CHF exacerbation. He has documented OSA and possible pulmonary hypertension based on previous echo. He uses 3L [place] at home. He also uses CPAP however his mask recently broke and he is waiting for the new one. He was placed on BIPAP briefly last night due to respiratory acidosis seen on ABG. Bipap was discontinued due to patient dry heaving and was started back on 3L [place]. He was in no respiratory distress today and looked more comfortable on exam.  -Continue 3L [place]  -Follow up with sleep doctor for new mask, CPAP vs BiPap  Elevated Troponin: Troponin of 0.82 on admission, trending down to 0.53 today. He denies any chest pain or SOB. No history of CAD. EKG was unchanged from priors. Elevated troponin's likely due to demand ischemia  -Follow up with cardiology outpatient for possible cath   HTN: He was hypertensive on admission most likely due to clonidine withdrawal, opioid withdrawal, pain and inability to tolerate PO meds for the past few days. Nausea and vomiting are not likely to be caused by hypertensive emergency. Today his blood pressures are  running low, 94/71. Will continue to monitor and discontinue hydralazine if becomes symptomatic.  -Continue hydralazine TID -Stop hydralazine if becomes symptomatic due to hypotension  Type II DM : He normally takes Levemir 40 units qhs and novolog 10 units TID.  -Continue levemir 10 units at bedtime and novolog 10 units with SSI    Dispo: Anticipated discharge in approximately 1-2 day(s).   Jaci Standard, DO 01/11/2018, 4:37 PM Pager: [phone]

## 2018-01-11 NOTE — Progress Notes (Signed)
Patient has Levemir 25 units due, RN text paged Internal Medicine Residency about medication/dose since patient currently NPO due to BIPAP.

## 2018-01-12 ENCOUNTER — Other Ambulatory Visit: Payer: Self-pay

## 2018-01-12 ENCOUNTER — Encounter (HOSPITAL_COMMUNITY): Payer: Self-pay | Admitting: General Practice

## 2018-01-12 LAB — GLUCOSE, CAPILLARY
GLUCOSE-CAPILLARY: 176 mg/dL — AB (ref 70–99)
GLUCOSE-CAPILLARY: 223 mg/dL — AB (ref 70–99)
Glucose-Capillary: 137 mg/dL — ABNORMAL HIGH (ref 70–99)
Glucose-Capillary: 179 mg/dL — ABNORMAL HIGH (ref 70–99)
Glucose-Capillary: 200 mg/dL — ABNORMAL HIGH (ref 70–99)

## 2018-01-12 LAB — BASIC METABOLIC PANEL
Anion gap: 10 (ref 5–15)
BUN: 44 mg/dL — AB (ref 6–20)
CHLORIDE: 97 mmol/L — AB (ref 98–111)
CO2: 31 mmol/L (ref 22–32)
Calcium: 8.2 mg/dL — ABNORMAL LOW (ref 8.9–10.3)
Creatinine, Ser: 4.54 mg/dL — ABNORMAL HIGH (ref 0.61–1.24)
GFR calc Af Amer: 16 mL/min — ABNORMAL LOW (ref >60)
GFR calc non Af Amer: 14 mL/min — ABNORMAL LOW (ref >60)
GLUCOSE: 165 mg/dL — AB (ref 70–99)
POTASSIUM: 3.7 mmol/L (ref 3.5–5.1)
Sodium: 138 mmol/L (ref 135–145)

## 2018-01-12 LAB — CK: Total CK: 126 U/L (ref 49–397)

## 2018-01-12 LAB — COOXEMETRY PANEL
CARBOXYHEMOGLOBIN: 1.5 % (ref 0.5–1.5)
Methemoglobin: 1.1 % (ref 0.0–1.5)
O2 SAT: 56.2 %
Total hemoglobin: 12.4 g/dL (ref 12.0–16.0)

## 2018-01-12 MED ORDER — SODIUM CHLORIDE 0.9 % IV SOLN
INTRAVENOUS | Status: DC
  Administered 2018-01-12 (×2): via INTRAVENOUS

## 2018-01-12 MED ORDER — POTASSIUM CHLORIDE 10 MEQ/100ML IV SOLN
10.0000 meq | INTRAVENOUS | Status: DC
  Filled 2018-01-12: qty 100

## 2018-01-12 MED ORDER — SODIUM CHLORIDE 0.9 % IV BOLUS
500.0000 mL | Freq: Once | INTRAVENOUS | Status: AC
  Administered 2018-01-12: 500 mL via INTRAVENOUS

## 2018-01-12 MED ORDER — PREGABALIN 50 MG PO CAPS
75.0000 mg | ORAL_CAPSULE | Freq: Two times a day (BID) | ORAL | Status: DC
  Administered 2018-01-12 – 2018-01-13 (×3): 75 mg via ORAL
  Filled 2018-01-12 (×3): qty 1

## 2018-01-12 NOTE — Care Management Note (Signed)
Case Management Note  Patient Details  Name: Kenneth Fox MRN: [id number] Date of Birth: 1970-09-14  Subjective/Objective:   CHF, acute on chronic renal failure, HTN, DM                 Action/Plan: NCM spoke to pt and lives at home with wife. He has oxygen ([employer]), has portable oxygen, RW and cane at home. He will have aide that comes at night from [facility] through his Medicaid. Pt will benefit from CHF Disease Management, and [facility] Protocol with [facility]. [facility] Pt states he had [facility] in the past. Message left for attending.   Expected Discharge Date:                  Expected Discharge Plan:  Home w Home Health Services  In-House Referral:  NA  Discharge planning Services  CM Consult  Post Acute Care Choice:  Home Health Choice offered to:  Patient  DME Arranged:  N/A DME Agency:  NA  HH Arranged:  RN HH Agency:     Status of Service:  In process, will continue to follow  If discussed at Long Length of Stay Meetings, dates discussed:    Additional Comments:  Elliot CousinShavis, Dareen Gutzwiller Ellen, RN 01/12/2018, 5:26 PM

## 2018-01-12 NOTE — Progress Notes (Signed)
Internal Medicine Attending  Date: 01/12/2018  Patient name: Marya Landryhomas E Sula Medical record number: [id number] Date of birth: 09/10/1970 Age: 47 y.o. Gender: male  I saw and evaluated the patient. I reviewed the resident's note by Dr. Karilyn Cotaehman and I agree with the resident's findings and plans as documented in her progress note.  When seen on rounds this morning Mr. Boudoin noted improvement in his nausea on the metoclopramide. He related that he been on this in the past, but it was stopped for some reason. Unfortunately, it appears the IV fluids were inadvertently stopped sometime in the last day or 2 and we did not pick up on that when we rounded on him. Therefore, his renal function is unchanged from yesterday, likely related the fact that he has not been getting hydrated for what we presume to be a prerenal azotemia. He was given a 500 mL bolus and we then started normal saline at 125 mL per hour. We will reassess his clinical status in the morning, as well as his renal function. If it is close to his baseline he should be ready for discharge home. We had a long discussion about the importance of addressing his obstructive sleep apnea with regards to his long-term survival. We are hopeful that this provided meaning to the need to continue with CPAP therapy given the value it contributes to his health.

## 2018-01-12 NOTE — Progress Notes (Signed)
Subjective: [name] was seen sitting up in a chair today and reported feeling better. He was tolerating PO intake and denied any nausea, vomiting, or abdominal pain. He was seen on 3L [place] and denied any SOB. He informed us his new CPAP company is [employer] and he will contact them about his mask today. He said he has been having regular bowel movements and urinating. He said he was feeling "off and shaky" and related these feelings to lyrica withdrawal.   Objective:  Vital signs in last 24 hours: Vitals:   01/12/18 0058 01/12/18 0423 01/12/18 0427 01/12/18 0821  BP: 113/67  120/74 134/81  Pulse: (!) 58  62 60  Resp: 13  13   Temp:    97.7 F (36.5 C)  TempSrc:    Axillary  SpO2: 99%  93% 98%  Weight:  (!) 328 lb 6.4 oz (149 kg)    Height:       Physical Exam: General- seen sitting a chair on 3L nasal cannula, in no acute distress with significant clinical improvement Cardio- distant heart sounds, no murmurs Lung- bilateral wheezing in upper lobes Extremities- no pitting edema Abdomen- no tenderness, no guarding   Assessment/Plan:  Active Problems:   Essential hypertension   Obstructive sleep apnea   Diabetic gastroparesis (HCC)   Acute on chronic renal failure (HCC)   Uncontrolled type 2 diabetes mellitus with diabetic neuropathy, with long-term current use of insulin (HCC)   Diastolic CHF (HCC)   Obesity hypoventilation syndrome (HCC)   Morbid obesity (HCC)   Chronic diastolic heart failure (HCC)  [name] is a 47 year old man with a medical history significant for OSA associated with pulmonary hypertension, morbid obesity, diabetes, gastroparesis, diastolic CHF and peripheral neuropathy treated with chronic opioids presenting with nausea, nonbilious nonbloody vomiting and diffuse abdominal pain.   Acute on chronic renal failure: Patient has a baseline Cr of 2.8 and was 5.7 on admission in the setting of decreased PO intake and diuretic overuse (bumex and lasix).  Morning labs showed a Cr level of 1.19 and K of 3.0; these labs were questionable and repeated with Cr of 4.54 today, similar to Cr level of 4.45 yesterday and K of 3.7. He has lower extremity edema with no pitting. Electrolytes are stable today. Nephrotoxic agents are still being held. Unfortunately, his 75 ml/hr LR was mistakingly held two nights ago and never restarted. This most likely explains why his Cr level is not improving.   -Start 500 mL NaCl .9% bolus, given over two hours one time -Continue 125 ml/hr NaCl .9% after 500 mL have been given  -continue to hold diuretics  -f/u bmp  -CK negative  Diabetic gastroparesis: [name]'s symptoms are similar to previous gastroparesis flares with no signs of SBO, infection or peritonitis. He is on chronic opioids for peripheral neuropathy. He did not have good PO intake the last few days leading up to this episode and had some diarrhea most likely due to opioid withdrawal. These symptoms started before AKI onset so not likely related to uremia. No more episodes of diarrhea or vomiting.   -Continue reglan 5 mg PO TID  -advance diet as tolerated   Obstructive sleep apnea and obesity hypoventilation syndrome: He was started on oxygen supplementation in 2015 during a hospitalization for CHF exacerbation. He has documented OSA and possible pulmonary hypertension based on previous echo. He uses 3L [place] at home. He also uses CPAP however his mask recently broke and he is  waiting for the new one.  He was placed on BIPAP briefly briefly night of admission due to respiratory acidosis seen on ABG. Bipap was discontinued due to patient dry heaving and was started back on 3L [place]. He was in no respiratory distress today and looked more comfortable on exam. He stated he will call [employer] regarding mask replacement.  -Continue 3L [place]  -Follow up with sleep doctor for new mask, CPAP vs BiPap  Elevated Troponin: Troponin of 0.82 on admission, trending down to  0.53 today. He denies any chest pain or SOB. No history of CAD. EKG was unchanged from priors. Elevated troponin's likely due to demand ischemia.  -Follow up with cardiology outpatient for possible cath   HTN: He was hypertensive on admission most likely due to clonidine withdrawal, opioid withdrawal, pain and inability to tolerate PO meds for the past few days. Nausea and vomiting are not likely to be caused by hypertensive emergency. Today his blood pressure is 134/81.   -Continue hydralazine TID -Continue clonidine and metoprolol  -Stop hydralazine if becomes symptomatic due to hypotension  Type II DM : He normally takes Levemir 40 units qhs and novolog 10 units TID. Restarted lyrica 75 mg BID today due to patient starting to feel "shaky" and informing us he is feeling like he is having withdrawals from lyrica.   -Continue levemir 10 units at bedtime and novolog 10 units with SSI  -Restart lyrica 75 mg BID    Dispo: Anticipated discharge in approximately 1 day(s).   Trevor Wilkie N, DO 01/12/2018, 1:12 PM Pager: [phone]

## 2018-01-12 NOTE — Progress Notes (Signed)
Confirmed w Dr Karilyn Cotaehman that new continuous fluid order is meant to be over patient's fluid restriction. Also informed Dr. Karilyn Cotaehman that previous fluids ordered were not running at the time I took over patient's care at 0700 and it is not clear when the fluids were stopped. Will continue to monitor.

## 2018-01-12 NOTE — Progress Notes (Signed)
Pt c/o anxiety/agitation and contributed it to withdrawal from Lyrica. RN checked PTA med list and found that pt was receiving 150 mg outpatient, and is only getting 75 mg inpatient. RN contacted on call resident which explained that pt is not receiving full amount of PTA dose r/t renal function. RN explained this to patient and pt became visibly upset. Will continue to monitor.

## 2018-01-12 NOTE — Progress Notes (Signed)
Spoke with lab about questionable lab results r/t the extreme change in Cr. Second results called directly to RN by lab and previous labs are now confirmed as questionable results. Will continue to monitor.

## 2018-01-12 NOTE — Progress Notes (Signed)
Reported to IM that K this AM is 3.0. Will continue to monitor.

## 2018-01-13 DIAGNOSIS — Z6841 Body Mass Index (BMI) 40.0 and over, adult: Secondary | ICD-10-CM

## 2018-01-13 DIAGNOSIS — N179 Acute kidney failure, unspecified: Secondary | ICD-10-CM

## 2018-01-13 LAB — BASIC METABOLIC PANEL
Anion gap: 8 (ref 5–15)
BUN: 46 mg/dL — ABNORMAL HIGH (ref 6–20)
CHLORIDE: 101 mmol/L (ref 98–111)
CO2: 29 mmol/L (ref 22–32)
Calcium: 8 mg/dL — ABNORMAL LOW (ref 8.9–10.3)
Creatinine, Ser: 4.03 mg/dL — ABNORMAL HIGH (ref 0.61–1.24)
GFR calc non Af Amer: 16 mL/min — ABNORMAL LOW (ref >60)
GFR, EST AFRICAN AMERICAN: 19 mL/min — AB (ref >60)
Glucose, Bld: 174 mg/dL — ABNORMAL HIGH (ref 70–99)
Potassium: 3.8 mmol/L (ref 3.5–5.1)
Sodium: 138 mmol/L (ref 135–145)

## 2018-01-13 LAB — GLUCOSE, CAPILLARY
Glucose-Capillary: 192 mg/dL — ABNORMAL HIGH (ref 70–99)
Glucose-Capillary: 181 mg/dL — ABNORMAL HIGH (ref 70–99)

## 2018-01-13 MED ORDER — PREGABALIN 75 MG PO CAPS
75.0000 mg | ORAL_CAPSULE | Freq: Two times a day (BID) | ORAL | 0 refills | Status: DC
Start: 2018-01-13 — End: 2018-02-23

## 2018-01-13 NOTE — Progress Notes (Signed)
Pt felt he had his cane with him when he arrived via EMS for admission. I was not able to locate Pt cane. I explained that typically EMS does not take canes/walkers with patient.

## 2018-01-13 NOTE — Care Management Note (Signed)
Case Management Note  Patient Details  Name: Kenneth Fox MRN: [id number] Date of Birth: 27-Aug-1970  Subjective/Objective:    Acute on chronic renal failure/CHF. Resides with wife, Glenis SmokerShakima.  Wray KearnsShakima West (Spouse)     [phone]       Action/Plan: Transition to home with home health services to follow. Pt states he receives PCS hours provided by [facility] which will resume once d/c. Wife to bring portable tank for transportation to home. Wife to provide transportation to home.  Expected Discharge Date:  01/13/18               Expected Discharge Plan:  Home w Home Health Services  In-House Referral:  NA  Discharge planning Services  CM Consult  Post Acute Care Choice:  Home Health Choice offered to:  Patient  DME Arranged:  N/A DME Agency:  NA  HH Arranged:  RN HH Agency:   [facility]  Status of Service:  Completed, signed off  If discussed at Long Length of Stay Meetings, dates discussed:    Additional Comments:  Epifanio LeschesCole, Shyler Hamill Hudson, RN 01/13/2018, 12:14 PM

## 2018-01-13 NOTE — Progress Notes (Signed)
Patient resting comfortably on nasal cannula this AM with no respiratory distress noted.  When attempting to pull Bipap machine from room, patient stated that he felt as though he would like to leave it in his room because he thought he might would need it again today.  Told patient that he looked fine sitting in chair and that oxygen level was ok.  Told patient we would leave machine in room but hold on putting it on until he began to have trouble breathing.  Will continue to monitor.

## 2018-01-13 NOTE — Progress Notes (Signed)
Subjective: [name] said he is feeling overall better today. He said he felt like he was getting too much fluid because he started feeling a pressure in his lower back and in his chest and that his legs were feeling swollen again. He denied any trouble breathing, SOB, or chest pain. He is able to urinate normally and said the color is getting lighter. He denied any abdominal pain, nausea or vomiting.   Objective:  Vital signs in last 24 hours: Vitals:   01/12/18 1857 01/12/18 1918 01/12/18 2351 01/13/18 0412  BP: 114/66 113/79 121/71 127/86  Pulse: 67 67 (!) 51 (!) 55  Resp:  (!) 24 19 18   Temp:  98.5 F (36.9 C) 98.2 F (36.8 C) 97.7 F (36.5 C)  TempSrc:  Oral Oral Oral  SpO2: 99% 100% 99% 95%  Weight:    (!) 152.3 kg  Height:       Physical Exam  Constitutional: He is oriented to person, place, and time. No distress.  Cardiovascular: Normal rate and regular rhythm.  No murmur heard. Distant heart sounds  Pulmonary/Chest: Effort normal. No respiratory distress. He has wheezes.  Abdominal: Soft. Bowel sounds are normal. He exhibits no distension. There is no tenderness. There is no guarding.  Neurological: He is alert and oriented to person, place, and time.    Assessment/Plan:  Active Problems:   Essential hypertension   Obstructive sleep apnea   Diabetic gastroparesis (HCC)   Acute on chronic renal failure (HCC)   Uncontrolled type 2 diabetes mellitus with diabetic neuropathy, with long-term current use of insulin (HCC)   Diastolic CHF (HCC)   Obesity hypoventilation syndrome (HCC)   Morbid obesity (HCC)   Chronic diastolic heart failure [facility](HCC)  [name] is a 47 year old man with OSA associated with pulmonary hypertension, morbid obesity, diabetes, gastroparesis, diastolic CHF and peripheral neuropathy treated with chronic opioids presenting with nausea, nonbilious nonbloody vomiting and diffuse abdominal pain. He is tolerating PO intake with downward trending  Cr.  Acute on chronic renal failure: Cr 4.0 down from 4.54 today after receiving ~1.5 L of normal saline. Baseline is 2.8. Likely his Cr will continue to trend downward with good PO intake. Recommend holding diuretics and metformin, decreasing lyrica dose and close follow up with PCP for medication adjustment.   -Continue to hold diuretics -f/u with PCP to adjust medications  Diabetic gastroparesis: Good PO intake and bowel movements. No abdominal pain, nausea or vomiting.   -Continue reglan 5 mg PO TID  -Recommended smaller meals more frequently instead of larger meals  Obstructive sleep apnea and obesity hypoventilation syndrome:Patient seen on 3L [place]. He was in no respiratory distress today and looked more comfortable on exam. He is still working to get a mask replacement. Stressed the importance of CPAP for long term health.    -Continue 3L [place]  -Follow up with sleep doctor for new mask, CPAP  Elevated Troponin: Troponin of 0.82 on admission, trending down to 0.60 and 0.53. He denies any chest pain or SOB.   -Follow up with cardiology outpatient  HTN: BP has been stable. 128/89 this morning.   -Continue hydralazine TID -Continue clonidine and metoprolol   Type II DM : CBG 181 today. Recommend to hold metformin and close follow up with PCP. Also decreased lyric from 150 mg TID to 75 mg BID for renal impairment dosing.  -Continue levemir 10 units at bedtime and novolog 10 units with SSI -Discontinue metformin with close f/u with PCP -  Lyrica 75 mg BID   Dispo: Patient is being discharged today.  Jaci Standard, DO 01/13/2018, 7:58 AM Pager: [phone]

## 2018-01-13 NOTE — Progress Notes (Signed)
Internal Medicine Attending  Date: 01/13/2018  Patient name: Kenneth Fox Medical record number: [id number] Date of birth: 06-Jun-1971 Age: 47 y.o. Gender: male  I saw and evaluated the patient. I reviewed the resident's note by Dr. Karilyn Cotaehman and I agree with the resident's findings and plans as documented in her progress note.  When seen on rounds this morning Kenneth Fox was feeling well. He stated he had no nausea or vomiting and his oral intake was good. His creatinine today dropped from 4.5 to 4.0. This was with 1-1/2 L of normal saline. At this point, I believe he can orally rehydrate given the gastroparesis is now under better control. He should follow-up with his primary care provider to assure his renal function returns to his baseline. He is stable for discharge home today.

## 2018-01-13 NOTE — Discharge Summary (Signed)
Name: Kenneth Fox MRN: [id number] DOB: [birth year] 47 y.o. PCP: Lindaann Pascal, PA-C  Date of Admission: 01/10/2018  8:59 AM Date of Discharge:  01/13/18 Attending Physician: Doneen Poisson, MD  Discharge Diagnosis: 1. Diabetic gastroparesis 2. Acute on chronic renal failure 3. Obstructive sleep apnea 4. Obesity hypoventilation syndrome 5. Elevated troponins  Discharge Medications: Allergies as of 01/13/2018      Reactions   Angiotensin Receptor Blockers Other (See Comments)   Acute renal failure in patient w R heart failure   Lisinopril Other (See Comments)   Patient developed AKI after being on lisinopril for a week.      Medication List    STOP taking these medications   furosemide 40 MG tablet Commonly known as:  LASIX   furosemide 80 MG tablet Commonly known as:  LASIX   metFORMIN 500 MG tablet Commonly known as:  GLUCOPHAGE   metolazone 10 MG tablet Commonly known as:  ZAROXOLYN     TAKE these medications   acetaminophen 500 MG tablet Commonly known as:  TYLENOL Take 1 tablet (500 mg total) by mouth every 6 (six) hours as needed.   cloNIDine 0.2 MG tablet Commonly known as:  CATAPRES Take 0.2 mg by mouth 2 (two) times daily. What changed:  Another medication with the same name was removed. Continue taking this medication, and follow the directions you see here.   hydrALAZINE 25 MG tablet Commonly known as:  APRESOLINE Take 1 tablet (25 mg total) by mouth 3 (three) times daily.   insulin aspart 100 UNIT/ML injection Commonly known as:  novoLOG Inject 10-20 Units into the skin 3 (three) times daily with meals. per sliding scale CBG 70 - 120: 0 units CBG 121 - 150: 2 units CBG 151 - 200: 3 units CBG 201 - 250: 5 units CBG 251 - 300: 8 units CBG 301 - 350: 11 units CBG 351 - 400: 15 units   insulin detemir 100 UNIT/ML injection Commonly known as:  LEVEMIR Inject 0.8 mLs (80 Units total) into the skin every evening. What changed:    how much to  take  when to take this   metoCLOPramide 10 MG tablet Commonly known as:  REGLAN Take 0.5 tablets (5 mg total) by mouth every 8 (eight) hours as needed for nausea.   metoprolol tartrate 25 MG tablet Commonly known as:  LOPRESSOR Take 1 tablet (25 mg total) by mouth 2 (two) times daily.   Oxycodone HCl 10 MG Tabs Take 10 mg by mouth every 6 (six) hours as needed for pain.   polyethylene glycol packet Commonly known as:  MIRALAX / GLYCOLAX Take 17 g by mouth daily as needed for mild constipation.   potassium chloride SA 20 MEQ tablet Commonly known as:  K-DUR,KLOR-CON Take 20 mEq by mouth daily.   pregabalin 75 MG capsule Commonly known as:  LYRICA Take 1 capsule (75 mg total) by mouth 2 (two) times daily. What changed:    medication strength  how much to take  when to take this       Disposition and follow-up:   Mr.[name] was discharged from [facility] in Stable condition.  At the hospital follow up visit please address:  1.  Acute on chronic renal failure- discontinued lasix bumix and metolazone, reconcile medications; Type II DM- discontinued metformin and renally dosed lyrica, reassess blood sugars and medications; OSA/obseity hypoventilation syndrome- make sure patient has a CPAP mask, previous mask broke and he is waiting  on a new one through mail order   2.  Labs / imaging needed at time of follow-up: follow up BMP  3.  Pending labs/ test needing follow-up: none  Follow-up Appointments: Follow-up Information    Annie Sable, MD Follow up on 02/02/2018.   Specialty:  Nephrology Why:  3:30pm Contact information: [address] [place] [address] [phone]        Lindaann Pascal, PA-C. Go on 01/19/2018.   Specialty:  Physician Assistant Why:  Follow up with PCP on 01/19/18 at 12:15 pm Contact information: [address] [facility] [address] [phone]          Hospital Course by problem list: 1. Acute  on chronic renal failure- Patient presented with a Cr of 5.7, baseline is 2.8, in the setting of decreased PO intake and diuretic overuse with bumex, lasix and metolazone. He was recently admitted in [place] for CHF and switched from lasix to bumex; however, continued taking both. Cr trended down to 4.0 with IV fluids. Diuretics and nephrotoxic agents were discontinued.   2. Diabetic gastroparesis- Patient's presenting symptoms were similar to previous gastroparesis flares. No signs of SBO, infection or peritonitis. Started on reglan and phenergan. PO intake and hydration improved over the course of the admission with resolution of abdominal pain, nausea and vomiting.  3. Obstructive sleep apnea and obesity hypoventilation syndrome- Patient uses 3L [place] at home and has not been using his CPAP for the past several months due to a broken mask. He ordered a new one but has not received it. He was placed on BiPAP briefly due to respiratory acidosis seen on ABG. Respiratory status improved and he contacted [facility] regarding mask replacement. 4. Elevated troponin- Troponin 0.82 on admission, trended down to 0.60 and 0.53. EKG was unchanged from priors and he denied chest pain. Likely due to demand ischemia, recommended f/u with cardiologist.  5. HTN- He was hypertensive on admission likely due to poor PO intake and inability to tolerate PO meds for the past few days. Blood pressure stabilized. Discontinued lasix, bumix and metolazone.   6. Type II DM- Continued levemir and novolog with SSI. Discontinued metformin due to nephrotoxicity. Initially held lyrica. Patient felt like he was having withdrawal symptoms so adjusted lyrica for renal impairment, 75 mg BID.  7. CHF- Patient denied chest pain and no signs of volume overload. Discontinued lasix, bumex and metolazone.   Discharge Vitals:   BP 128/89 (BP Location: Left Arm)   Pulse (!) 56   Temp 97.7 F (36.5 C) (Oral)   Resp 10   Ht 5\' 11"  (1.803 m)   Wt  (!) 152.3 kg   SpO2 95%   BMI 46.83 kg/m   Pertinent Labs, Studies, and Procedures:   BMP Latest Ref Rng & Units 01/13/2018 01/12/2018 01/12/2018  Glucose 70 - 99 mg/dL 324(M) 010(U) QUESTIONABLE RESULTS, RECOMMEND RECOLLECT TO VERIFY  BUN 6 - 20 mg/dL 72(Z) 36(U) QUESTIONABLE RESULTS, RECOMMEND RECOLLECT TO VERIFY  Creatinine 0.61 - 1.24 mg/dL 4.40(H) 4.74(Q) QUESTIONABLE RESULTS, RECOMMEND RECOLLECT TO VERIFY  Sodium 135 - 145 mmol/L 138 138 QUESTIONABLE RESULTS, RECOMMEND RECOLLECT TO VERIFY  Potassium 3.5 - 5.1 mmol/L 3.8 3.7 QUESTIONABLE RESULTS, RECOMMEND RECOLLECT TO VERIFY  Chloride 98 - 111 mmol/L 101 97(L) QUESTIONABLE RESULTS, RECOMMEND RECOLLECT TO VERIFY  CO2 22 - 32 mmol/L 29 31 QUESTIONABLE RESULTS, RECOMMEND RECOLLECT TO VERIFY  Calcium 8.9 - 10.3 mg/dL 8.0(L) 8.2(L) QUESTIONABLE RESULTS, RECOMMEND RECOLLECT TO VERIFY   Cardiac Panel (last 3 results)  Recent Labs    01/10/18 1722 01/10/18 2356 01/12/18 0829  CKTOTAL  --   --  126  TROPONINI 0.60* 0.53*  --     [place] Renal  Result Date: 01/10/2018 CLINICAL DATA:  Acute renal insufficiency. EXAM: RENAL / URINARY TRACT ULTRASOUND COMPLETE COMPARISON:  None. FINDINGS: Right Kidney: Length: 13 cm. Echogenicity within normal limits. No mass or hydronephrosis visualized. Left Kidney: Length: 12.9 cm. Echogenicity within normal limits. No mass or hydronephrosis visualized. Bladder: Appears normal for degree of bladder distention. IMPRESSION: No cause for acute renal insufficiency identified. Electronically Signed   By: Gerome Samavid  Williams III M.D   On: 01/10/2018 15:16   Dg Chest Port 1 View  Result Date: 01/10/2018 CLINICAL DATA:  Acute shortness of breath. EXAM: PORTABLE CHEST 1 VIEW COMPARISON:  01/10/2018 and prior radiographs FINDINGS: Cardiomegaly again noted. Mild subsegmental atelectasis/scarring in the RIGHT mid lung noted. Mild pulmonary vascular congestion is present. No definite edema, pleural effusion or pneumothorax noted.  IMPRESSION: Cardiomegaly with mild pulmonary vascular congestion. Electronically Signed   By: Harmon PierJeffrey  Hu M.D.   On: 01/10/2018 18:33   Dg Chest Port 1 View  Result Date: 01/10/2018 CLINICAL DATA:  Vomiting and shortness of breath today. History of CHF, gastroparesis, gastroesophageal reflux, diabetes EXAM: PORTABLE CHEST 1 VIEW COMPARISON:  PA and lateral chest x-ray of January 08, 2018 FINDINGS: The lungs are better inflated today. The basilar atelectasis on the left is less conspicuous. On the right persists. The interstitial markings are mildly increased. The central pulmonary vascularity is engorged and the cardiac silhouette is enlarged. IMPRESSION: CHF with mild interstitial edema slightly more conspicuous today. Persistent subsegmental atelectasis at the right lung base. Improved atelectasis on the left. Electronically Signed   By: David  [place] M.D.   On: 01/10/2018 09:27    Discharge Instructions: Follow up with PCP, nephrology and cardiology. Follow up with [facility] for CPAP mask.    SignedJaci Standard: Adonia Porada N, DO 01/13/2018, 10:10 AM   Pager: [phone]

## 2018-01-13 NOTE — Progress Notes (Signed)
Pt called out complaining of "having too much fluid." I assessed his lungs and lung sounds were unchanged from previous assessment. Bilateral upper lobes are clear and bilateral lower lobes are diminished. There are no signs of patient going into fluid overload. I placed ted hose on the pt and stopped the fluids per pt's request. Will continue to monitor the pt.

## 2018-01-14 ENCOUNTER — Encounter (HOSPITAL_COMMUNITY): Payer: Self-pay | Admitting: Emergency Medicine

## 2018-01-14 ENCOUNTER — Other Ambulatory Visit: Payer: Self-pay

## 2018-01-14 ENCOUNTER — Inpatient Hospital Stay (HOSPITAL_COMMUNITY)
Admission: EM | Admit: 2018-01-14 | Discharge: 2018-01-24 | DRG: 444 | Disposition: A | Discharge status: Home / Self Care | Payer: Medicare Other | Attending: Internal Medicine | Admitting: Internal Medicine

## 2018-01-14 DIAGNOSIS — I161 Hypertensive emergency: Secondary | ICD-10-CM | POA: Diagnosis present

## 2018-01-14 DIAGNOSIS — K819 Cholecystitis, unspecified: Secondary | ICD-10-CM

## 2018-01-14 DIAGNOSIS — N189 Chronic kidney disease, unspecified: Secondary | ICD-10-CM

## 2018-01-14 DIAGNOSIS — G4733 Obstructive sleep apnea (adult) (pediatric): Secondary | ICD-10-CM

## 2018-01-14 DIAGNOSIS — K8043 Calculus of bile duct with acute cholecystitis with obstruction: Principal | ICD-10-CM | POA: Diagnosis present

## 2018-01-14 DIAGNOSIS — E860 Dehydration: Secondary | ICD-10-CM | POA: Diagnosis present

## 2018-01-14 DIAGNOSIS — E1142 Type 2 diabetes mellitus with diabetic polyneuropathy: Secondary | ICD-10-CM | POA: Diagnosis present

## 2018-01-14 DIAGNOSIS — I5033 Acute on chronic diastolic (congestive) heart failure: Secondary | ICD-10-CM | POA: Diagnosis present

## 2018-01-14 DIAGNOSIS — K8050 Calculus of bile duct without cholangitis or cholecystitis without obstruction: Secondary | ICD-10-CM

## 2018-01-14 DIAGNOSIS — Z8249 Family history of ischemic heart disease and other diseases of the circulatory system: Secondary | ICD-10-CM

## 2018-01-14 DIAGNOSIS — N183 Chronic kidney disease, stage 3 (moderate): Secondary | ICD-10-CM | POA: Diagnosis present

## 2018-01-14 DIAGNOSIS — Z6841 Body Mass Index (BMI) 40.0 and over, adult: Secondary | ICD-10-CM

## 2018-01-14 DIAGNOSIS — Z9981 Dependence on supplemental oxygen: Secondary | ICD-10-CM

## 2018-01-14 DIAGNOSIS — K8031 Calculus of bile duct with cholangitis, unspecified, with obstruction: Secondary | ICD-10-CM

## 2018-01-14 DIAGNOSIS — Z832 Family history of diseases of the blood and blood-forming organs and certain disorders involving the immune mechanism: Secondary | ICD-10-CM

## 2018-01-14 DIAGNOSIS — K3184 Gastroparesis: Secondary | ICD-10-CM | POA: Diagnosis present

## 2018-01-14 DIAGNOSIS — K810 Acute cholecystitis: Secondary | ICD-10-CM

## 2018-01-14 DIAGNOSIS — I959 Hypotension, unspecified: Secondary | ICD-10-CM | POA: Diagnosis not present

## 2018-01-14 DIAGNOSIS — Z79899 Other long term (current) drug therapy: Secondary | ICD-10-CM

## 2018-01-14 DIAGNOSIS — Z833 Family history of diabetes mellitus: Secondary | ICD-10-CM

## 2018-01-14 DIAGNOSIS — R0902 Hypoxemia: Secondary | ICD-10-CM | POA: Diagnosis not present

## 2018-01-14 DIAGNOSIS — Z841 Family history of disorders of kidney and ureter: Secondary | ICD-10-CM

## 2018-01-14 DIAGNOSIS — J9611 Chronic respiratory failure with hypoxia: Secondary | ICD-10-CM | POA: Diagnosis present

## 2018-01-14 DIAGNOSIS — R1010 Upper abdominal pain, unspecified: Secondary | ICD-10-CM

## 2018-01-14 DIAGNOSIS — K429 Umbilical hernia without obstruction or gangrene: Secondary | ICD-10-CM | POA: Diagnosis present

## 2018-01-14 DIAGNOSIS — K219 Gastro-esophageal reflux disease without esophagitis: Secondary | ICD-10-CM | POA: Diagnosis present

## 2018-01-14 DIAGNOSIS — E662 Morbid (severe) obesity with alveolar hypoventilation: Secondary | ICD-10-CM | POA: Diagnosis present

## 2018-01-14 DIAGNOSIS — Z9989 Dependence on other enabling machines and devices: Secondary | ICD-10-CM

## 2018-01-14 DIAGNOSIS — E119 Type 2 diabetes mellitus without complications: Secondary | ICD-10-CM

## 2018-01-14 DIAGNOSIS — N179 Acute kidney failure, unspecified: Secondary | ICD-10-CM

## 2018-01-14 DIAGNOSIS — K760 Fatty (change of) liver, not elsewhere classified: Secondary | ICD-10-CM | POA: Diagnosis present

## 2018-01-14 DIAGNOSIS — F419 Anxiety disorder, unspecified: Secondary | ICD-10-CM | POA: Diagnosis present

## 2018-01-14 DIAGNOSIS — D573 Sickle-cell trait: Secondary | ICD-10-CM | POA: Diagnosis present

## 2018-01-14 DIAGNOSIS — I130 Hypertensive heart and chronic kidney disease with heart failure and stage 1 through stage 4 chronic kidney disease, or unspecified chronic kidney disease: Secondary | ICD-10-CM | POA: Diagnosis present

## 2018-01-14 DIAGNOSIS — Z794 Long term (current) use of insulin: Secondary | ICD-10-CM

## 2018-01-14 DIAGNOSIS — R945 Abnormal results of liver function studies: Secondary | ICD-10-CM

## 2018-01-14 DIAGNOSIS — I10 Essential (primary) hypertension: Secondary | ICD-10-CM | POA: Diagnosis present

## 2018-01-14 DIAGNOSIS — E1122 Type 2 diabetes mellitus with diabetic chronic kidney disease: Secondary | ICD-10-CM | POA: Diagnosis present

## 2018-01-14 DIAGNOSIS — E1143 Type 2 diabetes mellitus with diabetic autonomic (poly)neuropathy: Secondary | ICD-10-CM | POA: Diagnosis present

## 2018-01-14 DIAGNOSIS — Z888 Allergy status to other drugs, medicaments and biological substances status: Secondary | ICD-10-CM

## 2018-01-14 DIAGNOSIS — J449 Chronic obstructive pulmonary disease, unspecified: Secondary | ICD-10-CM | POA: Diagnosis present

## 2018-01-14 DIAGNOSIS — R7989 Other specified abnormal findings of blood chemistry: Secondary | ICD-10-CM

## 2018-01-14 HISTORY — DX: Abnormal results of liver function studies: R94.5

## 2018-01-14 HISTORY — DX: Other specified abnormal findings of blood chemistry: R79.89

## 2018-01-14 MED ORDER — ONDANSETRON 4 MG PO TBDP
4.0000 mg | ORAL_TABLET | Freq: Once | ORAL | Status: AC | PRN
  Administered 2018-01-14: 4 mg via ORAL
  Filled 2018-01-14: qty 1

## 2018-01-14 NOTE — ED Triage Notes (Signed)
Pt BIB EMS from home. Pt states that he has gastroparesis and is having a flare up. Reports N/V though no emesis for EMS.

## 2018-01-15 ENCOUNTER — Inpatient Hospital Stay (HOSPITAL_COMMUNITY): Payer: Medicare Other

## 2018-01-15 ENCOUNTER — Emergency Department (HOSPITAL_COMMUNITY): Payer: Medicare Other

## 2018-01-15 DIAGNOSIS — Z794 Long term (current) use of insulin: Secondary | ICD-10-CM | POA: Diagnosis not present

## 2018-01-15 DIAGNOSIS — Z8249 Family history of ischemic heart disease and other diseases of the circulatory system: Secondary | ICD-10-CM

## 2018-01-15 DIAGNOSIS — Z79899 Other long term (current) drug therapy: Secondary | ICD-10-CM

## 2018-01-15 DIAGNOSIS — K810 Acute cholecystitis: Secondary | ICD-10-CM | POA: Diagnosis not present

## 2018-01-15 DIAGNOSIS — I5033 Acute on chronic diastolic (congestive) heart failure: Secondary | ICD-10-CM | POA: Diagnosis present

## 2018-01-15 DIAGNOSIS — K8040 Calculus of bile duct with cholecystitis, unspecified, without obstruction: Secondary | ICD-10-CM | POA: Diagnosis not present

## 2018-01-15 DIAGNOSIS — N189 Chronic kidney disease, unspecified: Secondary | ICD-10-CM | POA: Diagnosis not present

## 2018-01-15 DIAGNOSIS — N179 Acute kidney failure, unspecified: Secondary | ICD-10-CM

## 2018-01-15 DIAGNOSIS — D573 Sickle-cell trait: Secondary | ICD-10-CM | POA: Diagnosis present

## 2018-01-15 DIAGNOSIS — R740 Nonspecific elevation of levels of transaminase and lactic acid dehydrogenase [LDH]: Secondary | ICD-10-CM | POA: Diagnosis not present

## 2018-01-15 DIAGNOSIS — E1122 Type 2 diabetes mellitus with diabetic chronic kidney disease: Secondary | ICD-10-CM | POA: Diagnosis present

## 2018-01-15 DIAGNOSIS — I161 Hypertensive emergency: Secondary | ICD-10-CM | POA: Diagnosis present

## 2018-01-15 DIAGNOSIS — K429 Umbilical hernia without obstruction or gangrene: Secondary | ICD-10-CM | POA: Diagnosis present

## 2018-01-15 DIAGNOSIS — R748 Abnormal levels of other serum enzymes: Secondary | ICD-10-CM | POA: Diagnosis not present

## 2018-01-15 DIAGNOSIS — K3184 Gastroparesis: Secondary | ICD-10-CM | POA: Diagnosis present

## 2018-01-15 DIAGNOSIS — G4733 Obstructive sleep apnea (adult) (pediatric): Secondary | ICD-10-CM | POA: Diagnosis not present

## 2018-01-15 DIAGNOSIS — K8063 Calculus of gallbladder and bile duct with acute cholecystitis with obstruction: Secondary | ICD-10-CM | POA: Diagnosis not present

## 2018-01-15 DIAGNOSIS — E1142 Type 2 diabetes mellitus with diabetic polyneuropathy: Secondary | ICD-10-CM | POA: Diagnosis present

## 2018-01-15 DIAGNOSIS — Z820 Family history of epilepsy and other diseases of the nervous system: Secondary | ICD-10-CM

## 2018-01-15 DIAGNOSIS — E662 Morbid (severe) obesity with alveolar hypoventilation: Secondary | ICD-10-CM

## 2018-01-15 DIAGNOSIS — Z9981 Dependence on supplemental oxygen: Secondary | ICD-10-CM

## 2018-01-15 DIAGNOSIS — K219 Gastro-esophageal reflux disease without esophagitis: Secondary | ICD-10-CM | POA: Diagnosis present

## 2018-01-15 DIAGNOSIS — E1143 Type 2 diabetes mellitus with diabetic autonomic (poly)neuropathy: Secondary | ICD-10-CM

## 2018-01-15 DIAGNOSIS — I5030 Unspecified diastolic (congestive) heart failure: Secondary | ICD-10-CM

## 2018-01-15 DIAGNOSIS — K760 Fatty (change of) liver, not elsewhere classified: Secondary | ICD-10-CM | POA: Diagnosis present

## 2018-01-15 DIAGNOSIS — Z888 Allergy status to other drugs, medicaments and biological substances status: Secondary | ICD-10-CM

## 2018-01-15 DIAGNOSIS — K8031 Calculus of bile duct with cholangitis, unspecified, with obstruction: Secondary | ICD-10-CM | POA: Diagnosis not present

## 2018-01-15 DIAGNOSIS — I130 Hypertensive heart and chronic kidney disease with heart failure and stage 1 through stage 4 chronic kidney disease, or unspecified chronic kidney disease: Secondary | ICD-10-CM | POA: Diagnosis present

## 2018-01-15 DIAGNOSIS — R0902 Hypoxemia: Secondary | ICD-10-CM | POA: Diagnosis present

## 2018-01-15 DIAGNOSIS — N183 Chronic kidney disease, stage 3 (moderate): Secondary | ICD-10-CM

## 2018-01-15 DIAGNOSIS — I5032 Chronic diastolic (congestive) heart failure: Secondary | ICD-10-CM | POA: Diagnosis not present

## 2018-01-15 DIAGNOSIS — Z833 Family history of diabetes mellitus: Secondary | ICD-10-CM

## 2018-01-15 DIAGNOSIS — I959 Hypotension, unspecified: Secondary | ICD-10-CM | POA: Diagnosis not present

## 2018-01-15 DIAGNOSIS — R945 Abnormal results of liver function studies: Secondary | ICD-10-CM | POA: Diagnosis not present

## 2018-01-15 DIAGNOSIS — E860 Dehydration: Secondary | ICD-10-CM | POA: Diagnosis present

## 2018-01-15 DIAGNOSIS — R1010 Upper abdominal pain, unspecified: Secondary | ICD-10-CM | POA: Diagnosis not present

## 2018-01-15 DIAGNOSIS — K8043 Calculus of bile duct with acute cholecystitis with obstruction: Secondary | ICD-10-CM | POA: Diagnosis present

## 2018-01-15 DIAGNOSIS — J9611 Chronic respiratory failure with hypoxia: Secondary | ICD-10-CM

## 2018-01-15 DIAGNOSIS — Z841 Family history of disorders of kidney and ureter: Secondary | ICD-10-CM

## 2018-01-15 DIAGNOSIS — Z9989 Dependence on other enabling machines and devices: Secondary | ICD-10-CM

## 2018-01-15 DIAGNOSIS — J810 Acute pulmonary edema: Secondary | ICD-10-CM | POA: Diagnosis not present

## 2018-01-15 DIAGNOSIS — R948 Abnormal results of function studies of other organs and systems: Secondary | ICD-10-CM | POA: Diagnosis not present

## 2018-01-15 DIAGNOSIS — R17 Unspecified jaundice: Secondary | ICD-10-CM | POA: Diagnosis not present

## 2018-01-15 DIAGNOSIS — F419 Anxiety disorder, unspecified: Secondary | ICD-10-CM | POA: Diagnosis present

## 2018-01-15 DIAGNOSIS — Z79891 Long term (current) use of opiate analgesic: Secondary | ICD-10-CM

## 2018-01-15 DIAGNOSIS — K8042 Calculus of bile duct with acute cholecystitis without obstruction: Secondary | ICD-10-CM | POA: Diagnosis not present

## 2018-01-15 DIAGNOSIS — Z6841 Body Mass Index (BMI) 40.0 and over, adult: Secondary | ICD-10-CM | POA: Diagnosis not present

## 2018-01-15 DIAGNOSIS — R935 Abnormal findings on diagnostic imaging of other abdominal regions, including retroperitoneum: Secondary | ICD-10-CM | POA: Diagnosis not present

## 2018-01-15 DIAGNOSIS — J449 Chronic obstructive pulmonary disease, unspecified: Secondary | ICD-10-CM | POA: Diagnosis present

## 2018-01-15 LAB — COMPREHENSIVE METABOLIC PANEL
ALBUMIN: 2.6 g/dL — AB (ref 3.5–5.0)
ALT: 124 U/L — ABNORMAL HIGH (ref 0–44)
AST: 247 U/L — AB (ref 15–41)
Alkaline Phosphatase: 215 U/L — ABNORMAL HIGH (ref 38–126)
Anion gap: 8 (ref 5–15)
BUN: 32 mg/dL — AB (ref 6–20)
CHLORIDE: 103 mmol/L (ref 98–111)
CO2: 29 mmol/L (ref 22–32)
Calcium: 9 mg/dL (ref 8.9–10.3)
Creatinine, Ser: 2.96 mg/dL — ABNORMAL HIGH (ref 0.61–1.24)
GFR calc Af Amer: 27 mL/min — ABNORMAL LOW (ref >60)
GFR calc non Af Amer: 23 mL/min — ABNORMAL LOW (ref >60)
GLUCOSE: 229 mg/dL — AB (ref 70–99)
POTASSIUM: 3.9 mmol/L (ref 3.5–5.1)
Sodium: 140 mmol/L (ref 135–145)
Total Bilirubin: 1.8 mg/dL — ABNORMAL HIGH (ref 0.3–1.2)
Total Protein: 6.4 g/dL — ABNORMAL LOW (ref 6.5–8.1)

## 2018-01-15 LAB — ECHOCARDIOGRAM COMPLETE
Height: 71 in
WEIGHTICAEL: 5232 [oz_av]

## 2018-01-15 LAB — URINALYSIS, ROUTINE W REFLEX MICROSCOPIC
BACTERIA UA: NONE SEEN
Bilirubin Urine: NEGATIVE
GLUCOSE, UA: 150 mg/dL — AB
Ketones, ur: NEGATIVE mg/dL
LEUKOCYTES UA: NEGATIVE
Nitrite: NEGATIVE
Specific Gravity, Urine: 1.009 (ref 1.005–1.030)
pH: 8 (ref 5.0–8.0)

## 2018-01-15 LAB — CBC
HEMATOCRIT: 38.2 % — AB (ref 39.0–52.0)
Hemoglobin: 12.2 g/dL — ABNORMAL LOW (ref 13.0–17.0)
MCH: 25.8 pg — ABNORMAL LOW (ref 26.0–34.0)
MCHC: 31.9 g/dL (ref 30.0–36.0)
MCV: 80.9 fL (ref 78.0–100.0)
Platelets: 215 10*3/uL (ref 150–400)
RBC: 4.72 MIL/uL (ref 4.22–5.81)
RDW: 17.2 % — AB (ref 11.5–15.5)
WBC: 8.8 10*3/uL (ref 4.0–10.5)

## 2018-01-15 LAB — LIPASE, BLOOD: LIPASE: 76 U/L — AB (ref 11–51)

## 2018-01-15 LAB — BRAIN NATRIURETIC PEPTIDE: B Natriuretic Peptide: 447.9 pg/mL — ABNORMAL HIGH (ref 0.0–100.0)

## 2018-01-15 LAB — ETHANOL

## 2018-01-15 LAB — GLUCOSE, CAPILLARY
Glucose-Capillary: 231 mg/dL — ABNORMAL HIGH (ref 70–99)
Glucose-Capillary: 184 mg/dL — ABNORMAL HIGH (ref 70–99)

## 2018-01-15 MED ORDER — METOPROLOL TARTRATE 25 MG PO TABS: 25.0000 mg | ORAL_TABLET | Freq: Two times a day (BID) | ORAL | Status: DC

## 2018-01-15 MED ORDER — LABETALOL HCL 5 MG/ML IV SOLN
20.0000 mg | Freq: Once | INTRAVENOUS | Status: AC
  Administered 2018-01-15: 20 mg via INTRAVENOUS
  Filled 2018-01-15: qty 4

## 2018-01-15 MED ORDER — MORPHINE SULFATE (PF) 4 MG/ML IV SOLN
4.0000 mg | Freq: Once | INTRAVENOUS | Status: AC
  Administered 2018-01-15: 4 mg via INTRAVENOUS

## 2018-01-15 MED ORDER — HYDROMORPHONE HCL 1 MG/ML IJ SOLN
0.5000 mg | INTRAMUSCULAR | Status: DC | PRN
  Administered 2018-01-16 – 2018-01-21 (×17): 0.5 mg via INTRAVENOUS
  Filled 2018-01-15 (×17): qty 1

## 2018-01-15 MED ORDER — ACETAMINOPHEN 650 MG RE SUPP: 650.0000 mg | Freq: Four times a day (QID) | RECTAL | Status: DC | PRN

## 2018-01-15 MED ORDER — METOCLOPRAMIDE HCL 5 MG/ML IJ SOLN
INTRAMUSCULAR | Status: AC
  Filled 2018-01-15: qty 2

## 2018-01-15 MED ORDER — ACETAMINOPHEN 325 MG PO TABS
650.0000 mg | ORAL_TABLET | Freq: Four times a day (QID) | ORAL | Status: DC | PRN
  Administered 2018-01-19 – 2018-01-23 (×4): 650 mg via ORAL
  Filled 2018-01-15 (×5): qty 2

## 2018-01-15 MED ORDER — PREGABALIN 100 MG PO CAPS
100.0000 mg | ORAL_CAPSULE | Freq: Three times a day (TID) | ORAL | Status: DC | PRN
  Administered 2018-01-15 – 2018-01-23 (×13): 100 mg via ORAL
  Filled 2018-01-15 (×13): qty 1

## 2018-01-15 MED ORDER — PIPERACILLIN-TAZOBACTAM 3.375 G IVPB
3.3750 g | Freq: Three times a day (TID) | INTRAVENOUS | Status: DC
  Administered 2018-01-15 – 2018-01-22 (×21): 3.375 g via INTRAVENOUS
  Filled 2018-01-15 (×25): qty 50

## 2018-01-15 MED ORDER — ENOXAPARIN SODIUM 40 MG/0.4ML ~~LOC~~ SOLN
40.0000 mg | Freq: Every day | SUBCUTANEOUS | Status: DC
  Administered 2018-01-15: 40 mg via SUBCUTANEOUS
  Filled 2018-01-15 (×2): qty 0.4

## 2018-01-15 MED ORDER — CLONIDINE HCL 0.1 MG PO TABS
0.2000 mg | ORAL_TABLET | Freq: Two times a day (BID) | ORAL | Status: DC
  Administered 2018-01-15 – 2018-01-23 (×18): 0.2 mg via ORAL
  Filled 2018-01-15: qty 2
  Filled 2018-01-15: qty 1
  Filled 2018-01-15 (×16): qty 2

## 2018-01-15 MED ORDER — NICARDIPINE HCL IN NACL 20-0.86 MG/200ML-% IV SOLN
3.0000 mg/h | INTRAVENOUS | Status: DC
  Administered 2018-01-15: 3 mg/h via INTRAVENOUS
  Filled 2018-01-15: qty 200

## 2018-01-15 MED ORDER — INSULIN DETEMIR 100 UNIT/ML ~~LOC~~ SOLN
10.0000 [IU] | Freq: Every day | SUBCUTANEOUS | Status: DC
  Administered 2018-01-15 – 2018-01-23 (×10): 10 [IU] via SUBCUTANEOUS
  Filled 2018-01-15 (×9): qty 0.1

## 2018-01-15 MED ORDER — SODIUM CHLORIDE 0.9 % IV SOLN
INTRAVENOUS | Status: DC | PRN
  Administered 2018-01-15: 16:00:00 via INTRAVENOUS

## 2018-01-15 MED ORDER — POLYETHYLENE GLYCOL 3350 17 G PO PACK
17.0000 g | PACK | Freq: Every day | ORAL | Status: DC | PRN
  Administered 2018-01-21: 17 g via ORAL
  Filled 2018-01-15: qty 1

## 2018-01-15 MED ORDER — SENNOSIDES-DOCUSATE SODIUM 8.6-50 MG PO TABS: 1.0000 | ORAL_TABLET | Freq: Every evening | ORAL | Status: DC | PRN

## 2018-01-15 MED ORDER — LACTATED RINGERS IV SOLN
INTRAVENOUS | Status: AC
  Administered 2018-01-15: 10:00:00 via INTRAVENOUS

## 2018-01-15 MED ORDER — SODIUM CHLORIDE 0.9% FLUSH
3.0000 mL | Freq: Two times a day (BID) | INTRAVENOUS | Status: DC
  Administered 2018-01-15 – 2018-01-23 (×14): 3 mL via INTRAVENOUS

## 2018-01-15 MED ORDER — METOPROLOL TARTRATE 25 MG PO TABS
25.0000 mg | ORAL_TABLET | Freq: Two times a day (BID) | ORAL | Status: DC
  Administered 2018-01-15 – 2018-01-23 (×18): 25 mg via ORAL
  Filled 2018-01-15 (×18): qty 1

## 2018-01-15 MED ORDER — INSULIN ASPART 100 UNIT/ML ~~LOC~~ SOLN
0.0000 [IU] | Freq: Three times a day (TID) | SUBCUTANEOUS | Status: DC
  Administered 2018-01-15 (×2): 5 [IU] via SUBCUTANEOUS
  Administered 2018-01-16 (×2): 3 [IU] via SUBCUTANEOUS
  Administered 2018-01-16: 5 [IU] via SUBCUTANEOUS
  Administered 2018-01-18 – 2018-01-19 (×2): 2 [IU] via SUBCUTANEOUS
  Administered 2018-01-19: 8 [IU] via SUBCUTANEOUS
  Administered 2018-01-20: 5 [IU] via SUBCUTANEOUS
  Administered 2018-01-20 – 2018-01-21 (×2): 3 [IU] via SUBCUTANEOUS
  Administered 2018-01-21: 2 [IU] via SUBCUTANEOUS
  Administered 2018-01-21 – 2018-01-22 (×2): 3 [IU] via SUBCUTANEOUS

## 2018-01-15 MED ORDER — METOCLOPRAMIDE HCL 5 MG/ML IJ SOLN
10.0000 mg | Freq: Once | INTRAMUSCULAR | Status: AC
  Administered 2018-01-15: 10 mg via INTRAVENOUS

## 2018-01-15 MED ORDER — ONDANSETRON HCL 4 MG/2ML IJ SOLN
4.0000 mg | Freq: Four times a day (QID) | INTRAMUSCULAR | Status: DC | PRN
  Administered 2018-01-16 – 2018-01-19 (×5): 4 mg via INTRAVENOUS
  Filled 2018-01-15 (×4): qty 2

## 2018-01-15 MED ORDER — ONDANSETRON HCL 4 MG PO TABS
4.0000 mg | ORAL_TABLET | Freq: Four times a day (QID) | ORAL | Status: DC | PRN
  Administered 2018-01-16 – 2018-01-18 (×3): 4 mg via ORAL
  Filled 2018-01-15 (×3): qty 1

## 2018-01-15 MED ORDER — PERFLUTREN LIPID MICROSPHERE
1.0000 mL | INTRAVENOUS | Status: AC | PRN
  Administered 2018-01-15: 2.5 mL via INTRAVENOUS
  Filled 2018-01-15: qty 10

## 2018-01-15 MED ORDER — HYDROMORPHONE HCL 1 MG/ML IJ SOLN: 1.0000 mg | INTRAMUSCULAR | Status: DC | PRN

## 2018-01-15 MED ORDER — MORPHINE SULFATE (PF) 4 MG/ML IV SOLN
INTRAVENOUS | Status: AC
  Filled 2018-01-15: qty 1

## 2018-01-15 MED ORDER — PIPERACILLIN-TAZOBACTAM 3.375 G IVPB 30 MIN
3.3750 g | Freq: Once | INTRAVENOUS | Status: AC
  Administered 2018-01-15: 3.375 g via INTRAVENOUS
  Filled 2018-01-15: qty 50

## 2018-01-15 MED ORDER — FUROSEMIDE 10 MG/ML IJ SOLN
60.0000 mg | Freq: Once | INTRAMUSCULAR | Status: AC
  Administered 2018-01-15: 60 mg via INTRAVENOUS
  Filled 2018-01-15: qty 6

## 2018-01-15 MED ORDER — HYDRALAZINE HCL 25 MG PO TABS
25.0000 mg | ORAL_TABLET | Freq: Three times a day (TID) | ORAL | Status: DC
  Administered 2018-01-15 – 2018-01-17 (×8): 25 mg via ORAL
  Filled 2018-01-15 (×8): qty 1

## 2018-01-15 MED ORDER — CLONIDINE HCL 0.2 MG PO TABS: 0.2000 mg | ORAL_TABLET | Freq: Two times a day (BID) | ORAL | Status: DC

## 2018-01-15 NOTE — Consult Note (Signed)
[facility] Surgery Consult Note  Kenneth Fox November 28, 1970  [id number].    Requesting MD: Beryle Beams Chief Complaint/Reason for Consult: Cholecystitis HPI:  Patient is a 47 year old male with PMH significant for sickle cell trait, HTN, CHF, T2DM, renal insufficiency and asthma who presented to [facility] with upper abdominal pain. Pain started yesterday after eating lunch with his daughter. Pain located in epigastric region, sharp and constant in nature. No relief until getting pain medication in ER this AM. Associated nausea, vomiting and cold sweats. Emesis NBNB. Denies chest pain, palpitations, increased SOB, diarrhea, constipation, bloody stools, urinary symptoms. Patient is on 3L supplemental O2 at home. Was recently discharged from the hospital 8/8 for abdominal pain, nausea and vomiting of unknown etiology. Allergic to ARBs. No past abdominal surgeries. No blood thinning medications. Patient denies alcohol, illicit drug or tobacco use.   ROS: Review of Systems  Constitutional: Positive for chills. Negative for fever.  Respiratory: Negative for shortness of breath.   Cardiovascular: Negative for chest pain and palpitations.  Gastrointestinal: Positive for abdominal pain, nausea and vomiting. Negative for blood in stool, constipation and diarrhea.  Genitourinary: Negative for dysuria, frequency and urgency.  All other systems reviewed and are negative.   Family History  Problem Relation Age of Onset  . Sickle cell anemia Mother   . Heart attack Mother   . Diabetes Father   . Neuropathy Father   . Hypertension Father   . Aneurysm Father   . Kidney disease Brother   . Kidney disease Sister     Past Medical History:  Diagnosis Date  . Asthma   . CHF (congestive heart failure) ([place])   . Diabetes ([place])   . Gastroparesis   . GERD (gastroesophageal reflux disease)   . Hypertension   . Kidney disorder   . Neuropathy   . Obesity   . Obstructive sleep apnea    will not wear  CPAP  . Renal insufficiency   . Sickle cell trait [facility])     Past Surgical History:  Procedure Laterality Date  . HIP FRACTURE SURGERY Right 1999   "put a plate in"    Social History:  reports that he has never smoked. He has never used smokeless tobacco. He reports that he does not drink alcohol or use drugs.  Allergies:  Allergies  Allergen Reactions  . Angiotensin Receptor Blockers Other (See Comments)    Acute renal failure in patient w R heart failure  . Lisinopril Other (See Comments)    Patient developed AKI after being on lisinopril for a week.     (Not in a hospital admission)  Blood pressure (!) 198/101, pulse 73, temperature 98 F (36.7 C), temperature source Oral, resp. rate 10, height 5' 11"  (1.803 m), weight (!) 148.3 kg, SpO2 94 %. Physical Exam: Physical Exam  Constitutional: He is oriented to person, place, and time. He appears well-developed. He is cooperative.  Non-toxic appearance. No distress. Nasal cannula in place.  HENT:  Head: Normocephalic and atraumatic.  Right Ear: External ear normal.  Left Ear: External ear normal.  Nose: Nose normal.  Mouth/Throat: Mucous membranes are normal.  Eyes: Pupils are equal, round, and reactive to light. Conjunctivae, EOM and lids are normal. No scleral icterus.  Neck: Normal range of motion and phonation normal. Neck supple.  Cardiovascular: Normal rate and regular rhythm.  Pulses:      Radial pulses are 2+ on the right side, and 2+ on the left side.  Dorsalis pedis pulses are 1+ on the right side, and 1+ on the left side.  Trace edema in bilateral LEs  Pulmonary/Chest: Tachypnea noted. He has wheezes in the right lower field and the left lower field. He has no rhonchi. He has no rales.  Abdominal: Soft. He exhibits no distension. Bowel sounds are decreased. There is no tenderness. There is no rigidity, no rebound, no guarding and negative Murphy's sign. A hernia (umbilical, not incarcerated ) is present.   Musculoskeletal:  ROM grossly intact in bilateral upper and lower extremities.   Neurological: He is alert and oriented to person, place, and time.  Skin: Skin is warm, dry and intact.  Psychiatric: He has a normal mood and affect. His speech is normal and behavior is normal.    Results for orders placed or performed during the hospital encounter of 01/14/18 (from the past 48 hour(s))  Lipase, blood     Status: Abnormal   Collection Time: 01/14/18 11:51 PM  Result Value Ref Range   Lipase 76 (H) 11 - 51 U/L    Comment: Performed at [facility], [address], [place], [place] [address]  Comprehensive metabolic panel     Status: Abnormal   Collection Time: 01/14/18 11:51 PM  Result Value Ref Range   Sodium 140 135 - 145 mmol/L   Potassium 3.9 3.5 - 5.1 mmol/L   Chloride 103 98 - 111 mmol/L   CO2 29 22 - 32 mmol/L   Glucose, Bld 229 (H) 70 - 99 mg/dL   BUN 32 (H) 6 - 20 mg/dL   Creatinine, Ser 2.96 (H) 0.61 - 1.24 mg/dL    Comment: DELTA CHECK NOTED   Calcium 9.0 8.9 - 10.3 mg/dL   Total Protein 6.4 (L) 6.5 - 8.1 g/dL   Albumin 2.6 (L) 3.5 - 5.0 g/dL   AST 247 (H) 15 - 41 U/L   ALT 124 (H) 0 - 44 U/L   Alkaline Phosphatase 215 (H) 38 - 126 U/L   Total Bilirubin 1.8 (H) 0.3 - 1.2 mg/dL   GFR calc non Af Amer 23 (L) >60 mL/min   GFR calc Af Amer 27 (L) >60 mL/min    Comment: (NOTE) The eGFR has been calculated using the CKD EPI equation. This calculation has not been validated in all clinical situations. eGFR's persistently <60 mL/min signify possible Chronic Kidney Disease.    Anion gap 8 5 - 15    Comment: Performed at [facility] [address] [place], [place] [address]  CBC     Status: Abnormal   Collection Time: 01/14/18 11:51 PM  Result Value Ref Range   WBC 8.8 4.0 - 10.5 K/uL   RBC 4.72 4.22 - 5.81 MIL/uL   Hemoglobin 12.2 (L) 13.0 - 17.0 g/dL   HCT 38.2 (L) 39.0 - 52.0 %   MCV 80.9 78.0 - 100.0 fL   MCH 25.8 (L) 26.0 - 34.0 pg   MCHC 31.9  30.0 - 36.0 g/dL   RDW 17.2 (H) 11.5 - 15.5 %   Platelets 215 150 - 400 K/uL    Comment: Performed at [facility] [address], [place], [place] [address]  Urinalysis, Routine w reflex microscopic     Status: Abnormal   Collection Time: 01/14/18 11:51 PM  Result Value Ref Range   Color, Urine YELLOW YELLOW   APPearance CLEAR CLEAR   Specific Gravity, Urine 1.009 1.005 - 1.030   pH 8.0 5.0 - 8.0  Glucose, UA 150 (A) NEGATIVE mg/dL   Hgb urine dipstick SMALL (A) NEGATIVE   Bilirubin Urine NEGATIVE NEGATIVE   Ketones, ur NEGATIVE NEGATIVE mg/dL   Protein, ur >=300 (A) NEGATIVE mg/dL   Nitrite NEGATIVE NEGATIVE   Leukocytes, UA NEGATIVE NEGATIVE   RBC / HPF 0-5 0 - 5 RBC/hpf   WBC, UA 0-5 0 - 5 WBC/hpf   Bacteria, UA NONE SEEN NONE SEEN    Comment: Performed at [facility] [address], [place], [place] [address]  Brain natriuretic peptide     Status: Abnormal   Collection Time: 01/15/18  1:22 AM  Result Value Ref Range   B Natriuretic Peptide 447.9 (H) 0.0 - 100.0 pg/mL    Comment: Performed at [facility] [address] [place], [place] [address]   Ct Abdomen Pelvis Wo Contrast  Result Date: 01/15/2018 CLINICAL DATA:  Acute onset of gastroparesis. Nausea and vomiting. EXAM: CT ABDOMEN AND PELVIS WITHOUT CONTRAST TECHNIQUE: Multidetector CT imaging of the abdomen and pelvis was performed following the standard protocol without IV contrast. COMPARISON:  CT of the abdomen and pelvis performed 01/08/2018, and renal ultrasound performed 01/10/2018 FINDINGS: Lower chest: Mild bibasilar opacities likely reflect atelectasis. The visualized portions of the mediastinum are unremarkable. Hepatobiliary: There is mild soft tissue inflammation about the base of the gallbladder, with adjacent nodes measuring up to 1.3 cm in short axis, raising concern for acute cholecystitis. The liver is grossly unremarkable in appearance. The common bile duct remains normal in caliber.  Pancreas: The pancreas is within normal limits. Spleen: The spleen is unremarkable in appearance. Adrenals/Urinary Tract: The adrenal glands are unremarkable in appearance. Nonspecific perinephric stranding and fluid are noted bilaterally. The kidneys are otherwise unremarkable. There is no evidence of hydronephrosis. No renal or ureteral stones are identified. Stomach/Bowel: The stomach is unremarkable in appearance. The small bowel is within normal limits. The appendix is normal in caliber, without evidence of appendicitis. The colon is unremarkable in appearance. Vascular/Lymphatic: The abdominal aorta is unremarkable in appearance. The inferior vena cava is grossly unremarkable. No retroperitoneal lymphadenopathy is seen. No pelvic sidewall lymphadenopathy is identified. Reproductive: The bladder is moderately distended and grossly unremarkable. The prostate is borderline normal in size. Other: Mild subcutaneous edema is noted along the anterior abdominal wall. Musculoskeletal: No acute osseous abnormalities are identified. Postoperative change is noted along the posterior right acetabulum and right ilium. The visualized musculature is unremarkable in appearance. IMPRESSION: 1. Mild soft tissue inflammation about the base of the gallbladder, with adjacent nodes measuring up to 1.3 cm in short axis, raising concern for acute cholecystitis. 2. Mild subcutaneous edema along the anterior abdominal wall. 3. Mild bibasilar airspace opacities likely reflect atelectasis. Electronically Signed   By: Garald Balding M.D.   On: 01/15/2018 04:30   Dg Chest Portable 1 View  Result Date: 01/15/2018 CLINICAL DATA:  Dyspnea and hypertension EXAM: PORTABLE CHEST 1 VIEW COMPARISON:  01/10/2018 FINDINGS: Low lung volumes with crowding of interstitial lung markings. Band like areas of opacity project over the lung bases likely representing areas of atelectasis. Foci of pneumonia are believed less likely given the bandlike  appearance. Stable cardiomegaly is identified with minimal aortic atherosclerosis. IMPRESSION: Stable cardiomegaly with low lung volumes and bandlike opacities at the lung bases likely representing atelectasis. Electronically Signed   By: Ashley Royalty M.D.   On: 01/15/2018 02:03   US Abdomen Limited Ruq  Result Date: 01/15/2018 CLINICAL DATA:  Acute onset of upper abdominal  pain. EXAM: ULTRASOUND ABDOMEN LIMITED RIGHT UPPER QUADRANT COMPARISON:  CT of the abdomen and pelvis performed earlier today at 4:12 a.m. FINDINGS: Gallbladder: The gallbladder wall is somewhat thickened, measuring 4 mm. There appears to be a wall echo shadow sign, reflecting stones filling the gallbladder. No ultrasonographic Murphy's sign is elicited. No pericholecystic fluid is seen. Common bile duct: Diameter: 0.3 cm, within normal limits in caliber. Liver: No focal lesion identified. Within normal limits in parenchymal echogenicity. Portal vein is patent on color Doppler imaging with normal direction of blood flow towards the liver. IMPRESSION: Stones noted filling the gallbladder. Somewhat thickened gallbladder wall, without definite evidence of cholecystitis. The appearance on CT could reflect cholangitis; would correlate with the patient's symptoms. Electronically Signed   By: Garald Balding M.D.   On: 01/15/2018 06:02      Assessment/Plan Sickle cell trait Acute on chronic renal insufficiency - Cr 2.96, baseline appears closer to 2.3 although Cr was  4.03 8/8  Type II Diabetes Mellitus with gastroparesis - per primary service HTN with hypertensive emergency - SBP in the 200s early this AM and DBP in the low 100s, this  will need to be controlled prior to consideration of any operative intervention  Acute on Chronic Diastolic CHF - BNP 790.3, last echo 10/2016 with LVEF of 65-70% Morbid obesity OSA Hx of asthma  Symptomatic Cholelithiasis, probable acute vs chronic cholecystitis - CT: Mild soft tissue inflammation about  the base of the gallbladder, with adjacent nodes measuring up to 1.3 cm in short axis, raising concern for acute cholecystitis - RUQ [place]: Stones noted filling the gallbladder. Somewhat thickened gallbladder wall, without definite evidence of cholecystitis - WBC 8.8, can't see where a temperature has been recorded today  - ALP 215, AST/ALT 247/124, Tbili 1.8, lipase 76 - monitor labs - recommend IV abx and CLD for now - needs to be medically stabilized - pt unsure whether he would want surgery and would need cardiac and pulmonary clearance prior to surgical intervention   FEN: CLD VTE: SCDs, ok to give lovenox from surgery standpoint ID: [id number] 8/10>>   Brigid Re, [facility] Surgery 01/15/2018, 7:43 AM Pager: [phone] Consults: [phone] Mon-Fri 7:00 am-4:30 pm Sat-Sun 7:00 am-11:30 am

## 2018-01-15 NOTE — Progress Notes (Signed)
  Echocardiogram 2D Echocardiogram with definity has been performed.  Leta JunglingCooper, Joe Gee M 01/15/2018, 2:51 PM

## 2018-01-15 NOTE — ED Notes (Signed)
Attending MD at bedside.

## 2018-01-15 NOTE — ED Provider Notes (Signed)
[facility] EMERGENCY DEPARTMENT Provider Note   CSN: [id number] Arrival date & time: 01/14/18  2334     History   Chief Complaint Chief Complaint  Patient presents with  . Abdominal Pain  . Nausea    HPI Kenneth Fox is a 47 y.o. male.  HPI Patient is a 47 year old male presents the emergency department with worsening abdominal pain since discharge from the hospital.  He was hospitalized and discharged 2 days ago for abdominal pain nausea vomiting of unclear etiology.  At that time he had a CT scan of his abdomen and pelvis which demonstrate no significant abnormality.  He states his pain is severe at this time.  His pain is generalized to the upper abdomen.  He does have a history of gastroparesis and thinks this could be related to his gastroparesis.  He reports mild shortness of breath.  He reports ongoing nausea vomiting.  Denies diarrhea.  No urinary complaints.  Pain is severe.  Denies back pain.  No significant chest pain.  Some mild shortness of breath.  Does have a history of congestive heart failure.  Recently found to have acute on chronic renal insufficiency and was taken off of his diuretics by the inpatient team.   Past Medical History:  Diagnosis Date  . Asthma   . CHF (congestive heart failure) (HCC)   . Diabetes (HCC)   . Gastroparesis   . GERD (gastroesophageal reflux disease)   . Hypertension   . Kidney disorder   . Neuropathy   . Obesity   . Obstructive sleep apnea    will not wear CPAP  . Renal insufficiency   . Sickle cell trait [facility])     Patient Active Problem List   Diagnosis Date Noted  . AKI (acute kidney injury) (HCC)   . Obesity hypoventilation syndrome (HCC)   . Morbid obesity (HCC)   . Chronic diastolic heart failure (HCC)   . Diastolic CHF (HCC) 01/10/2018  . Acute kidney injury superimposed on CKD (HCC) 03/04/2017  . Acute on chronic respiratory failure with hypoxia and hypercapnia (HCC) 10/26/2016  . Uncontrolled  hypertension 10/26/2016  . Fluid overload 10/13/2016  . Acute on chronic diastolic CHF (congestive heart failure) (HCC) 10/13/2016  . CHF (congestive heart failure) (HCC) 10/13/2016  . Acute kidney injury (HCC) 05/09/2016  . Dysphagia 12/12/2015  . Intractable nausea and vomiting 12/11/2015  . Leukocytosis 06/02/2015  . Morbid obesity due to excess calories (HCC) 06/02/2015  . Uncontrolled type 2 diabetes mellitus with diabetic neuropathy, with long-term current use of insulin (HCC)   . Elevated troponin 06/01/2015  . Acute on chronic renal failure (HCC) 12/03/2013  . Adverse reaction to lisinopril 12/03/2013  . Hypokalemia 11/25/2013  . Diabetic gastroparesis (HCC) 11/17/2013  . Obstructive sleep apnea 11/07/2012  . Essential hypertension 05/18/2011    Past Surgical History:  Procedure Laterality Date  . HIP FRACTURE SURGERY Right 1999   "put a plate in"        Home Medications    Prior to Admission medications   Medication Sig Start Date End Date Taking? Authorizing Provider  acetaminophen (TYLENOL) 500 MG tablet Take 1 tablet (500 mg total) by mouth every 6 (six) hours as needed. 02/05/17   Emi Holes, PA-C  cloNIDine (CATAPRES) 0.2 MG tablet Take 0.2 mg by mouth 2 (two) times daily. 01/06/18   [provider]  hydrALAZINE (APRESOLINE) 25 MG tablet Take 1 tablet (25 mg total) by mouth 3 (three) times daily.  03/07/17   Maxie Barb, MD  insulin aspart (NOVOLOG) 100 UNIT/ML injection Inject 10-20 Units into the skin 3 (three) times daily with meals. per sliding scale CBG 70 - 120: 0 units CBG 121 - 150: 2 units CBG 151 - 200: 3 units CBG 201 - 250: 5 units CBG 251 - 300: 8 units CBG 301 - 350: 11 units CBG 351 - 400: 15 units 06/06/15   Alison Murray, MD  insulin detemir (LEVEMIR) 100 UNIT/ML injection Inject 0.8 mLs (80 Units total) into the skin every evening. Patient taking differently: Inject 40 Units into the skin 2 (two) times daily.  12/13/15    Rai, Delene Ruffini, MD  metoCLOPramide (REGLAN) 10 MG tablet Take 0.5 tablets (5 mg total) by mouth every 8 (eight) hours as needed for nausea. 01/08/18   Linwood Dibbles, MD  metoprolol tartrate (LOPRESSOR) 25 MG tablet Take 1 tablet (25 mg total) by mouth 2 (two) times daily. 10/26/16   Dhungel, Theda Belfast, MD  Oxycodone HCl 10 MG TABS Take 10 mg by mouth every 6 (six) hours as needed for pain.    [provider]  polyethylene glycol (MIRALAX / GLYCOLAX) packet Take 17 g by mouth daily as needed for mild constipation. 01/28/17   Benjiman Core, MD  potassium chloride SA (K-DUR,KLOR-CON) 20 MEQ tablet Take 20 mEq by mouth daily.    [provider]  pregabalin (LYRICA) 75 MG capsule Take 1 capsule (75 mg total) by mouth 2 (two) times daily. 01/13/18   Rehman, Areeg N, DO    Family History Family History  Problem Relation Age of Onset  . Sickle cell anemia Mother   . Heart attack Mother   . Diabetes Father   . Neuropathy Father   . Hypertension Father   . Aneurysm Father   . Kidney disease Brother   . Kidney disease Sister     Social History Social History   Tobacco Use  . Smoking status: Never Smoker  . Smokeless tobacco: Never Used  Substance Use Topics  . Alcohol use: No  . Drug use: No     Allergies   Angiotensin receptor blockers and Lisinopril   Review of Systems Review of Systems  All other systems reviewed and are negative.    Physical Exam Updated Vital Signs BP (!) 177/94   Pulse 75   Temp 98 F (36.7 C) (Oral)   Resp 12   Ht 5\' 11"  (1.803 m)   Wt (!) 148.3 kg   SpO2 95%   BMI 45.61 kg/m   Physical Exam  Constitutional: He is oriented to person, place, and time. He appears well-developed and well-nourished.  HENT:  Head: Normocephalic and atraumatic.  Eyes: EOM are normal.  Neck: Normal range of motion.  Cardiovascular: Normal rate, regular rhythm, normal heart sounds and intact distal pulses.  Pulmonary/Chest: Effort normal and breath  sounds normal. No respiratory distress.  Abdominal: Soft. He exhibits no distension.  Obese abdomen.  Upper abdominal tenderness without guarding or rebound.  Musculoskeletal: Normal range of motion.  Neurological: He is alert and oriented to person, place, and time.  Skin: Skin is warm and dry.  Psychiatric: He has a normal mood and affect. Judgment normal.  Nursing note and vitals reviewed.    ED Treatments / Results  Labs (all labs ordered are listed, but only abnormal results are displayed) Labs Reviewed  LIPASE, BLOOD - Abnormal; Notable for the following components:      Result Value  Lipase 76 (*)    All other components within normal limits  COMPREHENSIVE METABOLIC PANEL - Abnormal; Notable for the following components:   Glucose, Bld 229 (*)    BUN 32 (*)    Creatinine, Ser 2.96 (*)    Total Protein 6.4 (*)    Albumin 2.6 (*)    AST 247 (*)    ALT 124 (*)    Alkaline Phosphatase 215 (*)    Total Bilirubin 1.8 (*)    GFR calc non Af Amer 23 (*)    GFR calc Af Amer 27 (*)    All other components within normal limits  CBC - Abnormal; Notable for the following components:   Hemoglobin 12.2 (*)    HCT 38.2 (*)    MCH 25.8 (*)    RDW 17.2 (*)    All other components within normal limits  URINALYSIS, ROUTINE W REFLEX MICROSCOPIC - Abnormal; Notable for the following components:   Glucose, UA 150 (*)    Hgb urine dipstick SMALL (*)    Protein, ur >=300 (*)    All other components within normal limits  BRAIN NATRIURETIC PEPTIDE - Abnormal; Notable for the following components:   B Natriuretic Peptide 447.9 (*)    All other components within normal limits  CULTURE, BLOOD (ROUTINE X 2)  CULTURE, BLOOD (ROUTINE X 2)  HEPATITIS PANEL, ACUTE  ETHANOL    EKG None  Radiology Ct Abdomen Pelvis Wo Contrast  Result Date: 01/15/2018 CLINICAL DATA:  Acute onset of gastroparesis. Nausea and vomiting. EXAM: CT ABDOMEN AND PELVIS WITHOUT CONTRAST TECHNIQUE: Multidetector  CT imaging of the abdomen and pelvis was performed following the standard protocol without IV contrast. COMPARISON:  CT of the abdomen and pelvis performed 01/08/2018, and renal ultrasound performed 01/10/2018 FINDINGS: Lower chest: Mild bibasilar opacities likely reflect atelectasis. The visualized portions of the mediastinum are unremarkable. Hepatobiliary: There is mild soft tissue inflammation about the base of the gallbladder, with adjacent nodes measuring up to 1.3 cm in short axis, raising concern for acute cholecystitis. The liver is grossly unremarkable in appearance. The common bile duct remains normal in caliber. Pancreas: The pancreas is within normal limits. Spleen: The spleen is unremarkable in appearance. Adrenals/Urinary Tract: The adrenal glands are unremarkable in appearance. Nonspecific perinephric stranding and fluid are noted bilaterally. The kidneys are otherwise unremarkable. There is no evidence of hydronephrosis. No renal or ureteral stones are identified. Stomach/Bowel: The stomach is unremarkable in appearance. The small bowel is within normal limits. The appendix is normal in caliber, without evidence of appendicitis. The colon is unremarkable in appearance. Vascular/Lymphatic: The abdominal aorta is unremarkable in appearance. The inferior vena cava is grossly unremarkable. No retroperitoneal lymphadenopathy is seen. No pelvic sidewall lymphadenopathy is identified. Reproductive: The bladder is moderately distended and grossly unremarkable. The prostate is borderline normal in size. Other: Mild subcutaneous edema is noted along the anterior abdominal wall. Musculoskeletal: No acute osseous abnormalities are identified. Postoperative change is noted along the posterior right acetabulum and right ilium. The visualized musculature is unremarkable in appearance. IMPRESSION: 1. Mild soft tissue inflammation about the base of the gallbladder, with adjacent nodes measuring up to 1.3 cm in short  axis, raising concern for acute cholecystitis. 2. Mild subcutaneous edema along the anterior abdominal wall. 3. Mild bibasilar airspace opacities likely reflect atelectasis. Electronically Signed   By: Roanna RaiderJeffery  Chang M.D.   On: 01/15/2018 04:30   Dg Chest Portable 1 View  Result Date: 01/15/2018 CLINICAL DATA:  Dyspnea and  hypertension EXAM: PORTABLE CHEST 1 VIEW COMPARISON:  01/10/2018 FINDINGS: Low lung volumes with crowding of interstitial lung markings. Band like areas of opacity project over the lung bases likely representing areas of atelectasis. Foci of pneumonia are believed less likely given the bandlike appearance. Stable cardiomegaly is identified with minimal aortic atherosclerosis. IMPRESSION: Stable cardiomegaly with low lung volumes and bandlike opacities at the lung bases likely representing atelectasis. Electronically Signed   By: Tollie Eth M.D.   On: 01/15/2018 02:03   US Abdomen Limited Ruq  Result Date: 01/15/2018 CLINICAL DATA:  Acute onset of upper abdominal pain. EXAM: ULTRASOUND ABDOMEN LIMITED RIGHT UPPER QUADRANT COMPARISON:  CT of the abdomen and pelvis performed earlier today at 4:12 a.m. FINDINGS: Gallbladder: The gallbladder wall is somewhat thickened, measuring 4 mm. There appears to be a wall echo shadow sign, reflecting stones filling the gallbladder. No ultrasonographic Murphy's sign is elicited. No pericholecystic fluid is seen. Common bile duct: Diameter: 0.3 cm, within normal limits in caliber. Liver: No focal lesion identified. Within normal limits in parenchymal echogenicity. Portal vein is patent on color Doppler imaging with normal direction of blood flow towards the liver. IMPRESSION: Stones noted filling the gallbladder. Somewhat thickened gallbladder wall, without definite evidence of cholecystitis. The appearance on CT could reflect cholangitis; would correlate with the patient's symptoms. Electronically Signed   By: Roanna Raider M.D.   On: 01/15/2018 06:02     Procedures Procedures (including critical care time)  Medications Ordered in ED Medications  nicardipine (CARDENE) 20mg  in 0.86% saline IV infusion (0.1 mg/ml) (3 mg/hr Intravenous New Bag/Given 01/15/18 0738)  ondansetron (ZOFRAN-ODT) disintegrating tablet 4 mg (4 mg Oral Given 01/14/18 2350)  morphine 4 MG/ML injection 4 mg (4 mg Intravenous Given 01/15/18 0129)  metoCLOPramide (REGLAN) injection 10 mg (10 mg Intravenous Given 01/15/18 0130)  labetalol (NORMODYNE,TRANDATE) injection 20 mg (20 mg Intravenous Given 01/15/18 0215)  piperacillin-tazobactam (ZOSYN) IVPB 3.375 g (0 g Intravenous Stopped 01/15/18 0720)  furosemide (LASIX) injection 60 mg (60 mg Intravenous Given 01/15/18 0733)     Initial Impression / Assessment and Plan / ED Course  I have reviewed the triage vital signs and the nursing notes.  Pertinent labs & imaging results that were available during my care of the patient were reviewed by me and considered in my medical decision making (see chart for details).     LFTs are now elevated.  This is abnormal for the patient as compared to his prior labs during his recent hospitalization.  Repeat CT imaging demonstrates possible signs of acute cholecystitis.  Right upper quadrant ultrasound demonstrates cholelithiasis without pericholecystic fluid.  No clear-cut radiographic evidence of acute cholecystitis based on ultrasound.  Given his elevated LFTs and his upper abdominal discomfort and his cholelithiasis this is likely acute cholecystitis versus acute cholangitis.  IV Zosyn given.  No hypotension.  No surgical emergency.  Patient will be admitted to hospital for ongoing work-up.  He may benefit from a HIDA scan  Final Clinical Impressions(s) / ED Diagnoses   Final diagnoses:  Upper abdominal pain  Elevated liver function tests    ED Discharge Orders    None       Azalia Bilis, MD 01/15/18 [id number]

## 2018-01-15 NOTE — ED Notes (Signed)
Patient currently at ultrasound .  

## 2018-01-15 NOTE — Progress Notes (Addendum)
Pharmacy Antibiotic Note  Kenneth Fox is a 47 y.o. male admitted on 01/14/2018 with abdominal pain and nausea since discharge recently.  Imaging shows stones in gallbladder, possible cholangitis.  Pharmacy has been consulted for Zosyn dosing for intra-abdominal infection.  SCr 2.96 yesterday, CrCL 31 ml/min, afebrile, WBC WNL.   Plan: Zosyn EID 3.375gm IV Q8H Monitor renal fxn, clinical progress   Height: 5\' 11"  (180.3 cm) Weight: (!) 327 lb (148.3 kg) IBW/kg (Calculated) : 75.3  Temp (24hrs), Avg:98 F (36.7 C), Min:98 F (36.7 C), Max:98 F (36.7 C)  Recent Labs  Lab 01/08/18 1950 01/10/18 0924 01/10/18 0933 01/11/18 0602 01/12/18 0425 01/12/18 0829 01/13/18 0701 01/14/18 2351  WBC 11.7* 13.3*  --  13.8*  --   --   --  8.8  CREATININE 3.35* 5.73* 5.90* 4.45* QUESTIONABLE RESULTS, RECOMMEND RECOLLECT TO VERIFY 4.54* 4.03* 2.96*  LATICACIDVEN  --   --  1.41  --   --   --   --   --     Estimated Creatinine Clearance: 44.6 mL/min (A) (by C-G formula based on SCr of 2.96 mg/dL (H)).    Allergies  Allergen Reactions  . Angiotensin Receptor Blockers Other (See Comments)    Acute renal failure in patient w R heart failure  . Lisinopril Other (See Comments)    Patient developed AKI after being on lisinopril for a week.     Zosyn 8/10 >>  8/10 BCx -    Kenneth Fox D. Laney Potashang, PharmD, BCPS, BCCCP 01/15/2018, 8:33 AM    ===========================  Addendum: Add Lovenox for VTE prophylaxis LFTs elevated, BMI = 45 Would typically do 0.5 mg/kg, but given poor renal clearance would hold off Mild anemia, plts WNL, no bleeding   Plan: Lovenox 40mg  SQ Q24H Monitor renal fxn and increase dose as appropriate   Kenneth Fox D. Laney Potashang, PharmD, BCPS, BCCCP 01/15/2018, 11:02 AM

## 2018-01-15 NOTE — ED Notes (Signed)
Patient transported to X-ray 

## 2018-01-15 NOTE — ED Notes (Signed)
Dr. Patria Maneampos ( EDP ) notified on pt.'s persistent hypertension , pt. drinking oral contrast for CT scan.

## 2018-01-15 NOTE — ED Notes (Signed)
ED TO INPATIENT HANDOFF REPORT  Name/Age/Gender Kenneth Fox 47 y.o. male  Code Status    Code Status Orders  (From admission, onward)         Start     Ordered   01/15/18 0749  Full code  Continuous     01/15/18 0815        Code Status History    Date Active Date Inactive Code Status Order ID Comments User Context   01/10/2018 1301 01/13/2018 1619 Full Code [id number]  Alphonzo Grieve, MD ED   03/04/2017 1832 03/07/2017 1448 Full Code [id number]  Rosita Fire, MD ED   10/21/2016 1636 10/26/2016 1447 Full Code [id number]  Arnell Asal, NP ED   10/13/2016 0233 10/15/2016 1821 Full Code [id number]  Rise Patience, MD ED   05/09/2016 0216 05/11/2016 1557 Full Code [id number]  Etta Quill, DO ED   12/11/2015 2056 12/13/2015 1451 Full Code [id number]  Norval Morton, MD ED   06/02/2015 0358 06/06/2015 1346 Full Code [id number]  Edwin Dada, MD Inpatient   03/09/2014 2150 03/12/2014 1322 Full Code [id number]  Theressa Millard, MD Inpatient   11/25/2013 0022 11/26/2013 1825 Full Code [id number]  Theressa Millard, MD Inpatient   11/17/2013 0121 11/18/2013 1915 Full Code [id number]  Etta Quill, DO ED   11/07/2012 1726 11/09/2012 1837 Full Code [id number]  Hosie Poisson, MD Inpatient   05/18/2011 0042 05/21/2011 1836 Full Code [id number]  Orlie Dakin, PA-C ED    Advance Directive Documentation     Most Recent Value  Type of Advance Directive  Healthcare Power of Attorney  Pre-existing out of facility DNR order (yellow form or pink MOST form)  -  "MOST" Form in Place?  -      Home/SNF/Other Home  Chief Complaint abd pain  Level of Care/Admitting Diagnosis ED Disposition    ED Disposition Condition [place]: [place] [100100]  Level of Care: Stepdown [14]  Diagnosis: Acute cholecystitis [790.0.ICD-9-CM]  Admitting Physician: Annia Belt [[id number]]  Attending Physician: Annia Belt [phone]   Estimated length of stay: past midnight tomorrow  Certification:: I certify this patient will need inpatient services for at least 2 midnights  PT Class (Do Not Modify): Inpatient [101]  PT Acc Code (Do Not Modify): Private [1]       Medical History Past Medical History:  Diagnosis Date  . Asthma   . CHF (congestive heart failure) ([place])   . Diabetes ([place])   . Gastroparesis   . GERD (gastroesophageal reflux disease)   . Hypertension   . Kidney disorder   . Neuropathy   . Obesity   . Obstructive sleep apnea    will not wear CPAP  . Renal insufficiency   . Sickle cell trait ([facility])     Allergies Allergies  Allergen Reactions  . Angiotensin Receptor Blockers Other (See Comments)    Acute renal failure in patient w R heart failure  . Lisinopril Other (See Comments)    Patient developed AKI after being on lisinopril for a week.    IV Location/Drains/Wounds Patient Lines/Drains/Airways Status   Active Line/Drains/Airways    Name:   Placement date:   Placement time:   Site:   Days:   Peripheral IV 01/15/18 Left Antecubital   01/15/18    0125    Antecubital   less than 1   Peripheral IV 01/15/18 Left Wrist   01/15/18  1010    Wrist   less than 1          Labs/Imaging Results for orders placed or performed during the hospital encounter of 01/14/18 (from the past 48 hour(s))  Lipase, blood     Status: Abnormal   Collection Time: 01/14/18 11:51 PM  Result Value Ref Range   Lipase 76 (H) 11 - 51 U/L    Comment: Performed at [facility] [address], [place], [place] [address]  Comprehensive metabolic panel     Status: Abnormal   Collection Time: 01/14/18 11:51 PM  Result Value Ref Range   Sodium 140 135 - 145 mmol/L   Potassium 3.9 3.5 - 5.1 mmol/L   Chloride 103 98 - 111 mmol/L   CO2 29 22 - 32 mmol/L   Glucose, Bld 229 (H) 70 - 99 mg/dL   BUN 32 (H) 6 - 20 mg/dL   Creatinine, Ser 2.96 (H) 0.61 - 1.24 mg/dL    Comment: DELTA CHECK NOTED   Calcium 9.0 8.9  - 10.3 mg/dL   Total Protein 6.4 (L) 6.5 - 8.1 g/dL   Albumin 2.6 (L) 3.5 - 5.0 g/dL   AST 247 (H) 15 - 41 U/L   ALT 124 (H) 0 - 44 U/L   Alkaline Phosphatase 215 (H) 38 - 126 U/L   Total Bilirubin 1.8 (H) 0.3 - 1.2 mg/dL   GFR calc non Af Amer 23 (L) >60 mL/min   GFR calc Af Amer 27 (L) >60 mL/min    Comment: (NOTE) The eGFR has been calculated using the CKD EPI equation. This calculation has not been validated in all clinical situations. eGFR's persistently <60 mL/min signify possible Chronic Kidney Disease.    Anion gap 8 5 - 15    Comment: Performed at [facility] [address], [place], [place] [address]  CBC     Status: Abnormal   Collection Time: 01/14/18 11:51 PM  Result Value Ref Range   WBC 8.8 4.0 - 10.5 K/uL   RBC 4.72 4.22 - 5.81 MIL/uL   Hemoglobin 12.2 (L) 13.0 - 17.0 g/dL   HCT 38.2 (L) 39.0 - 52.0 %   MCV 80.9 78.0 - 100.0 fL   MCH 25.8 (L) 26.0 - 34.0 pg   MCHC 31.9 30.0 - 36.0 g/dL   RDW 17.2 (H) 11.5 - 15.5 %   Platelets 215 150 - 400 K/uL    Comment: Performed at [facility] [address] [place], [place] [address]  Urinalysis, Routine w reflex microscopic     Status: Abnormal   Collection Time: 01/14/18 11:51 PM  Result Value Ref Range   Color, Urine YELLOW YELLOW   APPearance CLEAR CLEAR   Specific Gravity, Urine 1.009 1.005 - 1.030   pH 8.0 5.0 - 8.0   Glucose, UA 150 (A) NEGATIVE mg/dL   Hgb urine dipstick SMALL (A) NEGATIVE   Bilirubin Urine NEGATIVE NEGATIVE   Ketones, ur NEGATIVE NEGATIVE mg/dL   Protein, ur >=300 (A) NEGATIVE mg/dL   Nitrite NEGATIVE NEGATIVE   Leukocytes, UA NEGATIVE NEGATIVE   RBC / HPF 0-5 0 - 5 RBC/hpf   WBC, UA 0-5 0 - 5 WBC/hpf   Bacteria, UA NONE SEEN NONE SEEN    Comment: Performed at [facility] [address] [place], [place] [address]  Brain natriuretic peptide     Status: Abnormal   Collection Time: 01/15/18  1:22 AM  Result Value Ref Range  B Natriuretic Peptide 447.9 (H) 0.0 -  100.0 pg/mL    Comment: Performed at [facility] [address], [place], [place] [address]  Ethanol     Status: None   Collection Time: 01/15/18  7:34 AM  Result Value Ref Range   Alcohol, Ethyl (B) <10 <10 mg/dL    Comment: (NOTE) Lowest detectable limit for serum alcohol is 10 mg/dL. For medical purposes only. Performed at [facility], [place] [address] [place], [place] [address]    Ct Abdomen Pelvis Wo Contrast  Result Date: 01/15/2018 CLINICAL DATA:  Acute onset of gastroparesis. Nausea and vomiting. EXAM: CT ABDOMEN AND PELVIS WITHOUT CONTRAST TECHNIQUE: Multidetector CT imaging of the abdomen and pelvis was performed following the standard protocol without IV contrast. COMPARISON:  CT of the abdomen and pelvis performed 01/08/2018, and renal ultrasound performed 01/10/2018 FINDINGS: Lower chest: Mild bibasilar opacities likely reflect atelectasis. The visualized portions of the mediastinum are unremarkable. Hepatobiliary: There is mild soft tissue inflammation about the base of the gallbladder, with adjacent nodes measuring up to 1.3 cm in short axis, raising concern for acute cholecystitis. The liver is grossly unremarkable in appearance. The common bile duct remains normal in caliber. Pancreas: The pancreas is within normal limits. Spleen: The spleen is unremarkable in appearance. Adrenals/Urinary Tract: The adrenal glands are unremarkable in appearance. Nonspecific perinephric stranding and fluid are noted bilaterally. The kidneys are otherwise unremarkable. There is no evidence of hydronephrosis. No renal or ureteral stones are identified. Stomach/Bowel: The stomach is unremarkable in appearance. The small bowel is within normal limits. The appendix is normal in caliber, without evidence of appendicitis. The colon is unremarkable in appearance. Vascular/Lymphatic: The abdominal aorta is unremarkable in appearance. The inferior vena cava is grossly unremarkable. No  retroperitoneal lymphadenopathy is seen. No pelvic sidewall lymphadenopathy is identified. Reproductive: The bladder is moderately distended and grossly unremarkable. The prostate is borderline normal in size. Other: Mild subcutaneous edema is noted along the anterior abdominal wall. Musculoskeletal: No acute osseous abnormalities are identified. Postoperative change is noted along the posterior right acetabulum and right ilium. The visualized musculature is unremarkable in appearance. IMPRESSION: 1. Mild soft tissue inflammation about the base of the gallbladder, with adjacent nodes measuring up to 1.3 cm in short axis, raising concern for acute cholecystitis. 2. Mild subcutaneous edema along the anterior abdominal wall. 3. Mild bibasilar airspace opacities likely reflect atelectasis. Electronically Signed   By: Garald Balding M.D.   On: 01/15/2018 04:30   Dg Chest Portable 1 View  Result Date: 01/15/2018 CLINICAL DATA:  Dyspnea and hypertension EXAM: PORTABLE CHEST 1 VIEW COMPARISON:  01/10/2018 FINDINGS: Low lung volumes with crowding of interstitial lung markings. Band like areas of opacity project over the lung bases likely representing areas of atelectasis. Foci of pneumonia are believed less likely given the bandlike appearance. Stable cardiomegaly is identified with minimal aortic atherosclerosis. IMPRESSION: Stable cardiomegaly with low lung volumes and bandlike opacities at the lung bases likely representing atelectasis. Electronically Signed   By: Ashley Royalty M.D.   On: 01/15/2018 02:03   US Abdomen Limited Ruq  Result Date: 01/15/2018 CLINICAL DATA:  Acute onset of upper abdominal pain. EXAM: ULTRASOUND ABDOMEN LIMITED RIGHT UPPER QUADRANT COMPARISON:  CT of the abdomen and pelvis performed earlier today at 4:12 a.m. FINDINGS: Gallbladder: The gallbladder wall is somewhat thickened, measuring 4 mm. There appears to be a wall echo shadow sign, reflecting stones filling the gallbladder. No  ultrasonographic Murphy's sign is elicited.  No pericholecystic fluid is seen. Common bile duct: Diameter: 0.3 cm, within normal limits in caliber. Liver: No focal lesion identified. Within normal limits in parenchymal echogenicity. Portal vein is patent on color Doppler imaging with normal direction of blood flow towards the liver. IMPRESSION: Stones noted filling the gallbladder. Somewhat thickened gallbladder wall, without definite evidence of cholecystitis. The appearance on CT could reflect cholangitis; would correlate with the patient's symptoms. Electronically Signed   By: Garald Balding M.D.   On: 01/15/2018 06:02    Pending Labs Unresulted Labs (From admission, onward)    Start     Ordered   01/16/18 0500  Comprehensive metabolic panel  Tomorrow morning,   R     01/15/18 0815   01/16/18 0500  CBC  Tomorrow morning,   R     01/15/18 0815   01/15/18 0712  Culture, blood (Routine X 2) w Reflex to ID Panel  BLOOD CULTURE X 2,   R     01/15/18 0711   01/15/18 0633  Hepatitis panel, acute  STAT,   STAT     01/15/18 0632          Vitals/Pain Today's Vitals   01/15/18 1006 01/15/18 1015 01/15/18 1030 01/15/18 1045  BP: (!) 152/88 (!) 146/85 134/89 (!) 152/94  Pulse: 78 86  77  Resp: 15 14 (!) 21 18  Temp:      TempSrc:      SpO2: (!) 88% 93%  90%  Weight:      Height:      PainSc:        Isolation Precautions No active isolations  Medications Medications  sodium chloride flush (NS) 0.9 % injection 3 mL (3 mLs Intravenous Given 01/15/18 1011)  lactated ringers infusion ( Intravenous New Bag/Given 01/15/18 1014)  acetaminophen (TYLENOL) tablet 650 mg (has no administration in time range)    Or  acetaminophen (TYLENOL) suppository 650 mg (has no administration in time range)  senna-docusate (Senokot-S) tablet 1 tablet (has no administration in time range)  ondansetron (ZOFRAN) tablet 4 mg (has no administration in time range)    Or  ondansetron (ZOFRAN) injection 4 mg (has no  administration in time range)  hydrALAZINE (APRESOLINE) tablet 25 mg (25 mg Oral Given 01/15/18 1011)  cloNIDine (CATAPRES) tablet 0.2 mg (0.2 mg Oral Given 01/15/18 1011)  metoprolol tartrate (LOPRESSOR) tablet 25 mg (25 mg Oral Given 01/15/18 1011)  polyethylene glycol (MIRALAX / GLYCOLAX) packet 17 g (has no administration in time range)  pregabalin (LYRICA) capsule 100 mg (has no administration in time range)  piperacillin-tazobactam (ZOSYN) IVPB 3.375 g (has no administration in time range)  HYDROmorphone (DILAUDID) injection 0.5 mg (has no administration in time range)  insulin detemir (LEVEMIR) injection 10 Units (has no administration in time range)  insulin aspart (novoLOG) injection 0-15 Units (has no administration in time range)  ondansetron (ZOFRAN-ODT) disintegrating tablet 4 mg (4 mg Oral Given 01/14/18 2350)  morphine 4 MG/ML injection 4 mg (4 mg Intravenous Given 01/15/18 0129)  metoCLOPramide (REGLAN) injection 10 mg (10 mg Intravenous Given 01/15/18 0130)  labetalol (NORMODYNE,TRANDATE) injection 20 mg (20 mg Intravenous Given 01/15/18 0215)  piperacillin-tazobactam (ZOSYN) IVPB 3.375 g (0 g Intravenous Stopped 01/15/18 0720)  furosemide (LASIX) injection 60 mg (60 mg Intravenous Given 01/15/18 0733)    Mobility walks with person assist

## 2018-01-15 NOTE — ED Notes (Signed)
Admitting Provider at bedside. 

## 2018-01-15 NOTE — ED Notes (Signed)
EDP notified on pt.'s persistent hypertension . 

## 2018-01-15 NOTE — ED Notes (Signed)
Patient transported to Ultrasound 

## 2018-01-15 NOTE — H&P (Signed)
Date: 01/15/2018               Patient Name:  Kenneth Fox MRN: [id number]  DOB: 21-Jan-1971 Age / Sex: 47 y.o., male   PCP: Shanon Rosser, PA-C         Medical Service: Internal Medicine Teaching Service         Attending Physician: Dr. Beryle Beams, Alyson Locket, MD    First Contact: Dr. Laural Golden, Mirissa Lopresti  Pager: [phone]  Second Contact: Dr. Einar Gip Pager: [phone]       After Hours (After 5p/  First Contact Pager: [phone]  weekends / holidays): Second Contact Pager: [phone]   Chief Complaint: Abdominal pain  History of Present Illness: Mr. Viglione has a PMHx of HTN, morbid obesity, obstructive sleep apnea, Type II DM with gastroparesis, diastolic CHF, and CKD presenting with abdominal pain for one day. He was just discharged from the hospital on 01/13/18 for gastroparesis flare and acute on chronic renal failure. CT scan of his abdomen did not show any significant abnormality at that time. He reported having lunch with his daughter yesterday when he started having diffuse abdominal pain, chills, nausea and one episode of vomiting which was clear. He said his symptoms progressed over the next day which is what prompted him to come to the ED. He has not had a bowel movement since he left the hospital on 8/8. He said these symptoms felt different than the abdominal pain from his previous hospital admission. He initially felt his abdomen to be tight and distended but said it felt soft now. He denied any SOB, chest pain, headaches, bloody vomit or stool. He said nothing helped alleviate his symptoms. He denied any pain or nausea after morphine was given.   Meds:  Current Meds  Medication Sig  . acetaminophen (TYLENOL) 500 MG tablet Take 1 tablet (500 mg total) by mouth every 6 (six) hours as needed. (Patient taking differently: Take 500 mg by mouth every 6 (six) hours as needed for mild pain or headache. )  . hydrALAZINE (APRESOLINE) 25 MG tablet Take 1 tablet (25 mg total) by mouth 3  (three) times daily.  . insulin aspart (NOVOLOG) 100 UNIT/ML injection Inject 10-20 Units into the skin 3 (three) times daily with meals. per sliding scale CBG 70 - 120: 0 units CBG 121 - 150: 2 units CBG 151 - 200: 3 units CBG 201 - 250: 5 units CBG 251 - 300: 8 units CBG 301 - 350: 11 units CBG 351 - 400: 15 units  . insulin detemir (LEVEMIR) 100 UNIT/ML injection Inject 0.8 mLs (80 Units total) into the skin every evening. (Patient taking differently: Inject 40 Units into the skin 2 (two) times daily. )  . metoCLOPramide (REGLAN) 10 MG tablet Take 0.5 tablets (5 mg total) by mouth every 8 (eight) hours as needed for nausea.  . Oxycodone HCl 10 MG TABS Take 10 mg by mouth every 6 (six) hours as needed for pain.  . polyethylene glycol (MIRALAX / GLYCOLAX) packet Take 17 g by mouth daily as needed for mild constipation.  . pregabalin (LYRICA) 75 MG capsule Take 1 capsule (75 mg total) by mouth 2 (two) times daily.     Allergies: Allergies as of 01/14/2018 - Review Complete 01/14/2018  Allergen Reaction Noted  . Angiotensin receptor blockers Other (See Comments) 06/04/2015  . Lisinopril Other (See Comments) 12/03/2013   Past Medical History:  Diagnosis Date  . Asthma   . CHF (congestive heart  failure) ([place])   . Diabetes ([place])   . Gastroparesis   . GERD (gastroesophageal reflux disease)   . Hypertension   . Kidney disorder   . Neuropathy   . Obesity   . Obstructive sleep apnea    will not wear CPAP  . Renal insufficiency   . Sickle cell trait ([place])     Family History:   Family History  Problem Relation Age of Onset  . Sickle cell anemia Mother   . Heart attack Mother   . Diabetes Father   . Neuropathy Father   . Hypertension Father   . Aneurysm Father   . Kidney disease Brother   . Kidney disease Sister     Social History:   Social History   Socioeconomic History  . Marital status: Married    Spouse name: Not on file  . Number of children: 4  . Years of  education: Not on file  . Highest education level: Not on file  Occupational History  . Occupation: disabled  Social Needs  . Financial resource strain: Not on file  . Food insecurity:    Worry: Not on file    Inability: Not on file  . Transportation needs:    Medical: Not on file    Non-medical: Not on file  Tobacco Use  . Smoking status: Never Smoker  . Smokeless tobacco: Never Used  Substance and Sexual Activity  . Alcohol use: No  . Drug use: No  . Sexual activity: Yes    Partners: Female    Comment: married so no birth control  Lifestyle  . Physical activity:    Days per week: Not on file    Minutes per session: Not on file  . Stress: Not on file  Relationships  . Social connections:    Talks on phone: Not on file    Gets together: Not on file    Attends religious service: Not on file    Active member of club or organization: Not on file    Attends meetings of clubs or organizations: Not on file    Relationship status: Not on file  . Intimate partner violence:    Fear of current or ex partner: Not on file    Emotionally abused: Not on file    Physically abused: Not on file    Forced sexual activity: Not on file  Other Topics Concern  . 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 Then in [place] was worked as Biomedical scientist for [employer] before getting "sick with neuropathy". Out of work for 4 years.     Review of Systems: A complete ROS was negative except as per HPI.   Physical Exam: Blood pressure (!) 179/92, pulse 75, temperature 98 F (36.7 C), temperature source Oral, resp. rate 18, height 5' 11"  (1.803 m), weight (!) 148.3 kg, SpO2 99 %.  Physical Exam  Constitutional: He is oriented to person, place, and time. No distress.  Cardiovascular: Normal rate, regular rhythm and normal heart sounds.  No murmur heard. Pulmonary/Chest: Effort normal. No respiratory distress. He has  wheezes.  Abdominal: Soft. Bowel sounds are normal. He exhibits no distension. There is no tenderness. There is no rebound and no guarding.  Neurological: He is alert and oriented to person, place, and time.    EKG: pending  CXR: personally reviewed my interpretation stable cardiomegaly and  low lung volumes   Assessment & Plan by Problem:  Principal Problem:   Acute cholecystitis Active Problems:   Essential hypertension  Mr. Lyster is a 47 year old male with a PMHx of diastolic CHF, obstructive sleep apnea, HTN, Type II DM, gastroparesis and morbid obesity presenting with abdominal pain, elevated LFT's and imaging suggestive of acute cholecystitis.   Acute cholecystitis: Patient was recently discharged on 01/13/18 after being admitted for gastroparesis flare and acute on chronic renal failure. He started experiencing abdominal pain, chills, nausea and vomiting x1 the following day after eating lunch. He reported his abdominal pain felt different in nature than previous hospital admission. He denied any pain or nausea on exam, this was after morphine had been administered. LFTs showed elevated Alk Phos 215, AST 247, ALT 124 and total bilirubin 1.8. Lipase 76. [place] of abdomen shows gallbladder wall thickening and stones without evidence of cholecystitis. CT abdomen is concerning for acute cholecystitis vs cholangitis. Surgery has been consulted to evaluate for cholecystectomy.   -f/u surgical evaluation -NPO -continue zosyn   HTN: Patient came in with a BP of 191/100. He was discontinued from lasix, bumix and metolazone during his last hospital admission due to acute on chronic renal failure and dehydration. He was started on a cardene drip, one dose of labetalol and a one time dose of lasix to control his high blood pressure.  -continue cardene infusion -continue clonidine 0.2 mg oral BID  Diastolic CHF: Patient's echo in 10/2016 showed a LVEF of 65-70 %. He was recently admitted at [facility] on  12/23/2017 for his CHF and echo showed: "Suboptimal visualization of the left ventricle but appears normal in size and systolic function. Flattened septum in systole consistent with right ventricular pressure overload. Diastolic function was difficult to determine due to insufficient data. Suboptimal visualization of the right ventricle, but it appears dilated with decreased systolic function. Right ventricular systolic pressure is unable to be estimated due to insufficient Doppler signal."   His diuretics were discontinued during his last admission due to acute on chronic renal failure likely due dehydration/overuse of diuretics. CXR showed stable cardiomegaly, low lung volumes and atelectasis.  -lasix 60 mg IV once  -hydralazine 25 mg TID  -metoprolol 25 mg oral BID   Elevated troponins: On his last admission, he was found to have elevated troponins 0.83, 0.60, 0.53 most likely demand ischemia, right heart strain due to underlying pulmonary disease. He denies chest pain and SOB. EKG pending.  CKD: Cr on admission 2.96, near his baseline of 2.8. Nephrotoxic agents are being held since last admission,  lasix, bumex, metolazone and metformin.  -continue LR 125 mL/hr IV and follow bmet  Chronic hypoxic respiratory failure: He was started on oxygen supplementation in 2015 due to CHF exacerbation. He was diagnosed with OSA and possible pulmonary hypertension on prior echo's which are limited in quality due to patient habitus. He is stable on 3L [place]. No signs of volume overload or SOB.   -continue 3L [place]   Obstructive sleep apnea and obesity hypoventilation syndrome: No respiratory distress today. He also uses a CPAP at home but is currently trying to acquire a new mask because his mask at home is broken.   -CPAP qhs  Type II DM: Home medications include levemir 40 units qhs and novolog 10 units TID with SSI. Blood sugar today is 181. Will also monitor for gastroparesis flares.   -Levemir 10 units,  novolog 10 units with SSI -Lyrica 100 mg TID PRN  -  Reglan 10 mg IV x1  Peripheral neuropathy: Patient was previuosly on lyrica 150 mg TID and oxycodone 10 mg q6hrs prn at home. He was renally dosed both at [facility] during his last admission to 75 mg BID. Cr has improved close to baseline. Will continue 100 mg TID for now.   -Lyrica continue 100 mg TID.     Dispo: Admit patient to Inpatient with expected length of stay greater than 2 midnights.  SignedMike Craze, DO 01/15/2018, 9:18 AM  Pager: [phone]

## 2018-01-16 ENCOUNTER — Inpatient Hospital Stay (HOSPITAL_COMMUNITY): Payer: Medicare Other

## 2018-01-16 DIAGNOSIS — R740 Nonspecific elevation of levels of transaminase and lactic acid dehydrogenase [LDH]: Secondary | ICD-10-CM

## 2018-01-16 DIAGNOSIS — R17 Unspecified jaundice: Secondary | ICD-10-CM

## 2018-01-16 DIAGNOSIS — K810 Acute cholecystitis: Secondary | ICD-10-CM

## 2018-01-16 LAB — CBC
HCT: 37.9 % — ABNORMAL LOW (ref 39.0–52.0)
Hemoglobin: 11.8 g/dL — ABNORMAL LOW (ref 13.0–17.0)
MCH: 25.5 pg — AB (ref 26.0–34.0)
MCHC: 31.1 g/dL (ref 30.0–36.0)
MCV: 81.9 fL (ref 78.0–100.0)
PLATELETS: 208 10*3/uL (ref 150–400)
RBC: 4.63 MIL/uL (ref 4.22–5.81)
RDW: 17.8 % — ABNORMAL HIGH (ref 11.5–15.5)
WBC: 7 10*3/uL (ref 4.0–10.5)

## 2018-01-16 LAB — COMPREHENSIVE METABOLIC PANEL
ALT: 266 U/L — AB (ref 0–44)
AST: 265 U/L — AB (ref 15–41)
Albumin: 2.4 g/dL — ABNORMAL LOW (ref 3.5–5.0)
Alkaline Phosphatase: 291 U/L — ABNORMAL HIGH (ref 38–126)
Anion gap: 10 (ref 5–15)
BUN: 25 mg/dL — ABNORMAL HIGH (ref 6–20)
CHLORIDE: 100 mmol/L (ref 98–111)
CO2: 30 mmol/L (ref 22–32)
CREATININE: 2.97 mg/dL — AB (ref 0.61–1.24)
Calcium: 8.5 mg/dL — ABNORMAL LOW (ref 8.9–10.3)
GFR calc non Af Amer: 23 mL/min — ABNORMAL LOW (ref >60)
GFR, EST AFRICAN AMERICAN: 27 mL/min — AB (ref >60)
GLUCOSE: 212 mg/dL — AB (ref 70–99)
Potassium: 4.1 mmol/L (ref 3.5–5.1)
SODIUM: 140 mmol/L (ref 135–145)
Total Bilirubin: 3.9 mg/dL — ABNORMAL HIGH (ref 0.3–1.2)
Total Protein: 5.9 g/dL — ABNORMAL LOW (ref 6.5–8.1)

## 2018-01-16 LAB — HEPATITIS PANEL, ACUTE
HCV Ab: <0.1 s/co ratio (ref 0.0–0.9)
HEP B S AG: NEGATIVE
Hep A IgM: NEGATIVE
Hep B C IgM: NEGATIVE

## 2018-01-16 LAB — BILIRUBIN, FRACTIONATED(TOT/DIR/INDIR)
Bilirubin, Direct: 2.4 mg/dL — ABNORMAL HIGH (ref 0.0–0.2)
Indirect Bilirubin: 1.4 mg/dL — ABNORMAL HIGH (ref 0.3–0.9)
Total Bilirubin: 3.8 mg/dL — ABNORMAL HIGH (ref 0.3–1.2)

## 2018-01-16 LAB — GLUCOSE, CAPILLARY
GLUCOSE-CAPILLARY: 187 mg/dL — AB (ref 70–99)
GLUCOSE-CAPILLARY: 191 mg/dL — AB (ref 70–99)
Glucose-Capillary: 213 mg/dL — ABNORMAL HIGH (ref 70–99)
Glucose-Capillary: 113 mg/dL — ABNORMAL HIGH (ref 70–99)

## 2018-01-16 MED ORDER — SODIUM CHLORIDE 0.9 % IV SOLN
INTRAVENOUS | Status: DC | PRN
  Administered 2018-01-16: 13:00:00 via INTRAVENOUS

## 2018-01-16 MED ORDER — PROMETHAZINE HCL 25 MG/ML IJ SOLN
12.5000 mg | INTRAMUSCULAR | Status: DC | PRN
  Administered 2018-01-19: 12.5 mg via INTRAVENOUS
  Filled 2018-01-16: qty 1

## 2018-01-16 MED ORDER — PROMETHAZINE HCL 25 MG/ML IJ SOLN
25.0000 mg | INTRAMUSCULAR | Status: DC | PRN
  Administered 2018-01-16: 25 mg via INTRAVENOUS
  Filled 2018-01-16: qty 1

## 2018-01-16 MED ORDER — PROMETHAZINE HCL 25 MG RE SUPP
25.0000 mg | Freq: Four times a day (QID) | RECTAL | Status: DC | PRN
  Filled 2018-01-16: qty 1

## 2018-01-16 MED ORDER — PROMETHAZINE HCL 25 MG PO TABS: 25.0000 mg | ORAL_TABLET | ORAL | Status: DC | PRN

## 2018-01-16 MED ORDER — ENOXAPARIN SODIUM 80 MG/0.8ML ~~LOC~~ SOLN
75.0000 mg | Freq: Every day | SUBCUTANEOUS | Status: DC
  Administered 2018-01-16 – 2018-01-18 (×3): 75 mg via SUBCUTANEOUS
  Filled 2018-01-16 (×3): qty 0.75

## 2018-01-16 MED ORDER — PROMETHAZINE HCL 25 MG PO TABS
25.0000 mg | ORAL_TABLET | ORAL | Status: DC | PRN
  Administered 2018-01-16: 25 mg via ORAL
  Filled 2018-01-16: qty 1

## 2018-01-16 NOTE — Progress Notes (Signed)
Medicine attending: We appreciate surgery follow-up.  Recommending MRCP to evaluate common bile duct.  I examined this patient today together with resident physician Dr. reach Dionicia Ableraman and I concur with her evaluation and management plan which we discussed together. Persistent abdominal pain poorly localized primarily central and lower.  Exam handicapped by morbid obesity. Deterioration in liver functions with progressive elevation of alkaline phosphatase, transaminases, and bilirubin. Echocardiogram yesterday similar to study done last year with LV ejection fraction 55-60%, technically difficult but although right ventricular cavity size recorded as normal with normal function, suggestion of mild right ventricular enlargement with RV dysfunction.  Pulmonary artery pressure not estimated. Impression: Acute cholecystitis in a morbidly obese man with obstructive sleep apnea on CPAP who is chronically oxygen dependent. We will go ahead and get pulmonary function tests since no baseline.  He will be at risk for postop delayed extubation. Patient quite anxious but understands that benefits of cholecystectomy outweigh risk of not operating.  If risk of general anesthesia deemed excessive by pulmonary, would then have to default to a percutaneous cholecystotomy.

## 2018-01-16 NOTE — Progress Notes (Signed)
RT placed pt on CPAP for the night on his home setting of 5. Pt tolerating well.

## 2018-01-16 NOTE — Progress Notes (Signed)
Subjective/Chief Complaint: Patient had a clear liquid tray this morning and is now very nauseated.   PFT's pending - Monday T. Bili up significantly this morning  Objective: Vital signs in last 24 hours: Temp:  [97.6 F (36.4 C)-98.1 F (36.7 C)] 97.6 F (36.4 C) (08/11 0749) Pulse Rate:  [57-70] 62 (08/11 0740) Resp:  [14-22] 14 (08/11 0740) BP: (151-168)/(79-111) 160/111 (08/11 0749) SpO2:  [91 %-99 %] 99 % (08/11 0740) Last BM Date: 01/15/18  Intake/Output from previous day: 08/10 0701 - 08/11 0700 In: 218.5 [I.V.:72.8; IV Piggyback:145.8] Out: 600 [Urine:600] Intake/Output this shift: No intake/output data recorded.  Morbidly obese, uncomfortable No obvious scleral icterus Abd - obese, epigastric discomfort Umbilical hernia - soft  Lab Results:  Recent Labs    01/14/18 2351 01/16/18 0354  WBC 8.8 7.0  HGB 12.2* 11.8*  HCT 38.2* 37.9*  PLT 215 208   BMET Recent Labs    01/14/18 2351 01/16/18 0354  NA 140 140  K 3.9 4.1  CL 103 100  CO2 29 30  GLUCOSE 229* 212*  BUN 32* 25*  CREATININE 2.96* 2.97*  CALCIUM 9.0 8.5*   Hepatic Function Latest Ref Rng & Units 01/16/2018 01/16/2018 01/14/2018  Total Protein 6.5 - 8.1 g/dL - 5.9(L) 6.4(L)  Albumin 3.5 - 5.0 g/dL - 2.4(L) 2.6(L)  AST 15 - 41 U/L - 265(H) 247(H)  ALT 0 - 44 U/L - 266(H) 124(H)  Alk Phosphatase 38 - 126 U/L - 291(H) 215(H)  Total Bilirubin 0.3 - 1.2 mg/dL 3.8(H) 3.9(H) 1.8(H)  Bilirubin, Direct 0.0 - 0.2 mg/dL 2.4(H) - -    PT/INR No results for input(s): LABPROT, INR in the last 72 hours. ABG No results for input(s): PHART, HCO3 in the last 72 hours.  Invalid input(s): PCO2, PO2  Studies/Results: Ct Abdomen Pelvis Wo Contrast  Result Date: 01/15/2018 CLINICAL DATA:  Acute onset of gastroparesis. Nausea and vomiting. EXAM: CT ABDOMEN AND PELVIS WITHOUT CONTRAST TECHNIQUE: Multidetector CT imaging of the abdomen and pelvis was performed following the standard protocol without IV  contrast. COMPARISON:  CT of the abdomen and pelvis performed 01/08/2018, and renal ultrasound performed 01/10/2018 FINDINGS: Lower chest: Mild bibasilar opacities likely reflect atelectasis. The visualized portions of the mediastinum are unremarkable. Hepatobiliary: There is mild soft tissue inflammation about the base of the gallbladder, with adjacent nodes measuring up to 1.3 cm in short axis, raising concern for acute cholecystitis. The liver is grossly unremarkable in appearance. The common bile duct remains normal in caliber. Pancreas: The pancreas is within normal limits. Spleen: The spleen is unremarkable in appearance. Adrenals/Urinary Tract: The adrenal glands are unremarkable in appearance. Nonspecific perinephric stranding and fluid are noted bilaterally. The kidneys are otherwise unremarkable. There is no evidence of hydronephrosis. No renal or ureteral stones are identified. Stomach/Bowel: The stomach is unremarkable in appearance. The small bowel is within normal limits. The appendix is normal in caliber, without evidence of appendicitis. The colon is unremarkable in appearance. Vascular/Lymphatic: The abdominal aorta is unremarkable in appearance. The inferior vena cava is grossly unremarkable. No retroperitoneal lymphadenopathy is seen. No pelvic sidewall lymphadenopathy is identified. Reproductive: The bladder is moderately distended and grossly unremarkable. The prostate is borderline normal in size. Other: Mild subcutaneous edema is noted along the anterior abdominal wall. Musculoskeletal: No acute osseous abnormalities are identified. Postoperative change is noted along the posterior right acetabulum and right ilium. The visualized musculature is unremarkable in appearance. IMPRESSION: 1. Mild soft tissue inflammation about the base of the  gallbladder, with adjacent nodes measuring up to 1.3 cm in short axis, raising concern for acute cholecystitis. 2. Mild subcutaneous edema along the anterior  abdominal wall. 3. Mild bibasilar airspace opacities likely reflect atelectasis. Electronically Signed   By: Garald Balding M.D.   On: 01/15/2018 04:30   Dg Chest Portable 1 View  Result Date: 01/15/2018 CLINICAL DATA:  Dyspnea and hypertension EXAM: PORTABLE CHEST 1 VIEW COMPARISON:  01/10/2018 FINDINGS: Low lung volumes with crowding of interstitial lung markings. Band like areas of opacity project over the lung bases likely representing areas of atelectasis. Foci of pneumonia are believed less likely given the bandlike appearance. Stable cardiomegaly is identified with minimal aortic atherosclerosis. IMPRESSION: Stable cardiomegaly with low lung volumes and bandlike opacities at the lung bases likely representing atelectasis. Electronically Signed   By: Ashley Royalty M.D.   On: 01/15/2018 02:03   US Abdomen Limited Ruq  Result Date: 01/15/2018 CLINICAL DATA:  Acute onset of upper abdominal pain. EXAM: ULTRASOUND ABDOMEN LIMITED RIGHT UPPER QUADRANT COMPARISON:  CT of the abdomen and pelvis performed earlier today at 4:12 a.m. FINDINGS: Gallbladder: The gallbladder wall is somewhat thickened, measuring 4 mm. There appears to be a wall echo shadow sign, reflecting stones filling the gallbladder. No ultrasonographic Murphy's sign is elicited. No pericholecystic fluid is seen. Common bile duct: Diameter: 0.3 cm, within normal limits in caliber. Liver: No focal lesion identified. Within normal limits in parenchymal echogenicity. Portal vein is patent on color Doppler imaging with normal direction of blood flow towards the liver. IMPRESSION: Stones noted filling the gallbladder. Somewhat thickened gallbladder wall, without definite evidence of cholecystitis. The appearance on CT could reflect cholangitis; would correlate with the patient's symptoms. Electronically Signed   By: Garald Balding M.D.   On: 01/15/2018 06:02    Anti-infectives: Anti-infectives (From admission, onward)   Start     Dose/Rate Route  Frequency Ordered Stop   01/15/18 1200  piperacillin-tazobactam (ZOSYN) IVPB 3.375 g     3.375 g 12.5 mL/hr over 240 Minutes Intravenous Every 8 hours 01/15/18 0833     01/15/18 0530  piperacillin-tazobactam (ZOSYN) IVPB 3.375 g     3.375 g 100 mL/hr over 30 Minutes Intravenous  Once 01/15/18 0517 01/15/18 0720      Assessment/Plan: Sickle cell trait Acute on chronic renal insufficiency - Cr 2.96, baseline appears closer to 2.3 although Cr was     4.03 8/8  Type II Diabetes Mellitus with gastroparesis - per primary service HTN with hypertensive emergency - SBP in the 200s early this AM and DBP in the low 100s, this        will need to be controlled prior to consideration of any operative intervention  Acute on Chronic Diastolic CHF - BNP 106.2, last echo 10/2016 with LVEF of 65-70% Morbid obesity OSA Hx of asthma  Symptomatic Cholelithiasis, probable acute vs chronic cholecystitis - CT: Mild soft tissue inflammation about the base of the gallbladder, with adjacent nodes measuring up to 1.3 cm in short axis, raising concern for acute cholecystitis - RUQ [place]: Stones noted filling the gallbladder. Somewhat thickened gallbladder wall, without definite evidence of cholecystitis - Total bilirubin up to 3.9, direct 2.4 - will order MRCP to evaluate CBD - pulmonary function testing pending - ?Monday  FEN: back off to ice chips, will need some maintenance IV fluids VTE: SCDs, ok to give lovenox from surgery standpoint ID: [id number] 8/10>>  LOS: 1 day    Maia Petties 01/16/2018

## 2018-01-16 NOTE — Plan of Care (Signed)
?  Problem: Clinical Measurements: ?Goal: Will remain free from infection ?Outcome: Progressing ?  ?

## 2018-01-16 NOTE — Progress Notes (Signed)
RT placed pt on CPAP for the night on home setting of 5 with 3lpm O2 bled in. Pt resting and comfortable.

## 2018-01-16 NOTE — Progress Notes (Signed)
Subjective: Kenneth Fox reported upper abdominal pain and nausea this morning. He denied any nausea, constipation, diarrhea, SOB, chest pain, fever or chills. He was apprehensive about surgery but understood the need. He reported having any appetite.   Objective:  Vital signs in last 24 hours: Vitals:   01/15/18 2300 01/16/18 0300 01/16/18 0740 01/16/18 0749  BP: (!) 161/89 (!) 155/92 (!) 160/111 (!) 160/111  Pulse: 63 61 62   Resp: 14 15 14    Temp: 98.1 F (36.7 C) 97.8 F (36.6 C) 97.6 F (36.4 C) 97.6 F (36.4 C)  TempSrc: Axillary Oral Oral   SpO2: 96% 96% 99%   Weight:      Height:       Physical Exam  Constitutional: He is oriented to person, place, and time. No distress.  Cardiovascular: Normal rate, regular rhythm and normal heart sounds.  No murmur heard. Abdominal: Soft. Bowel sounds are normal. He exhibits no distension. There is tenderness. There is no guarding.  Neurological: He is alert and oriented to person, place, and time.  Pulmonary: on 3L [place], effort normal, diminished breath sounds at bilateral bases   Assessment/Plan:  Principal Problem:   Acute cholecystitis Active Problems:   Essential hypertension   OSA on CPAP   Diabetes mellitus type 2, insulin dependent (HCC)   Oxygen dependent  Kenneth Fox is a 47 year old male with a PMHx of diastolic CHF, obstructive sleep apnea, HTN, Type II DM, gastroparesis and morbid obesity presenting with abdominal pain, elevated LFT's and imaging suggestive of acute cholecystitis.   Acute cholecystitis: Patient is complaining of nausea and tenderness in his epigastric and bilateral upper quadrants. CT abdomen and US abdomen concerning for acute cholecystitis. LFT's suggesitve of biliary obstruction. T Bilirubin has increased from 1.8 to 3.8, direct 2.4. Surgery recommending MRCP to evaluate CBD. Repeat echo (technically difficult due to habitus) showed LV EF 55-60%, with mild RVE with RV dysfunction most likely due to  OSA. Ordered PFTs.   -f/u MRCP -f/u PFT's -diet per surgery recommendations -continue zosyn   HTN: Patient came in with a BP of 154/101. Discontinued lasix, bumix and metolazone during his last hospital admission due to acute on chronic renal failure and dehydration. Continue clonidine, hydralazine and metoprolol.  -continue clonidine 0.2 mg oral BID -continue hydralazine 25 mg TID -continue metoprolol 25 mg BID  Diastolic CHF: Patient's echo from 01/15/18 showed a LVEF of 55-60% with no systolic dysfunction. Mild RVE with RV dysfunction most likely due to OSA.   -see HTN therapy above    Elevated troponins: On his last admission, he was found to have elevated troponins 0.83, 0.60, 0.53 most likely demand ischemia, right heart strain due to underlying pulmonary disease. He denies chest pain and SOB. EKG shows unchanged t wave inversions.   CKD: Cr on admission 2.96, near his baseline of 2.8. Continue to hold nephrotoxic agents - lasix, bumex, metolazone and metformin.  -continue NS 75 ml/hr  Chronic hypoxic respiratory failure: He is stable on 3L [place]. No signs of volume overload or SOB. PFT's ordered.  -continue 3L [place]   Obstructive sleep apnea and obesity hypoventilation syndrome: No respiratory distress today.   -CPAP qhs  Type II DM: Home medications include levemir 40 units qhs and novolog 10 units TID with SSI. Blood sugar today is 187. Will also monitor for gastroparesis flares.   -Levemir 10 units, novolog 10 units with SSI -Lyrica 100 mg TID PRN  -Reglan 10 mg IV x1  Peripheral  neuropathy: Lyrica continue 100 mg TID.   Dispo: Anticipated discharge in approximately 2 day(s). Pending MRCP and surgery evaluation for cholecystectomy.   Kenneth Fox N, DO 01/16/2018, 9:09 AM Pager: [phone]

## 2018-01-16 NOTE — Progress Notes (Signed)
Patient received medication for anxiety prior to coming to MRI.  Patient was placed in scanner and patient immediately wanted out.  Patient said he did not want the test and he did not want any more medication.  Patient sent back to room and exam cancelled.

## 2018-01-17 ENCOUNTER — Inpatient Hospital Stay (HOSPITAL_COMMUNITY): Payer: Medicare Other

## 2018-01-17 DIAGNOSIS — N189 Chronic kidney disease, unspecified: Secondary | ICD-10-CM

## 2018-01-17 LAB — SPIROMETRY WITH GRAPH
FEF 25-75 POST: 2.51 L/s
FEF 25-75 Pre: 2.27 L/sec
FEF2575-%Change-Post: 10 %
FEF2575-%Pred-Post: 72 %
FEF2575-%Pred-Pre: 65 %
FEV1-%CHANGE-POST: 5 %
FEV1-%PRED-PRE: 33 %
FEV1-%Pred-Post: 34 %
FEV1-POST: 1.22 L
FEV1-PRE: 1.16 L
FEV1FVC-%Change-Post: -4 %
FEV1FVC-%PRED-PRE: 114 %
FEV6-%Change-Post: 9 %
FEV6-%PRED-POST: 32 %
FEV6-%PRED-PRE: 29 %
FEV6-POST: 1.38 L
FEV6-Pre: 1.26 L
FEV6FVC-%CHANGE-POST: 0 %
FEV6FVC-%PRED-POST: 103 %
FEV6FVC-%Pred-Pre: 102 %
FVC-%Change-Post: 10 %
FVC-%PRED-POST: 32 %
FVC-%PRED-PRE: 28 %
FVC-POST: 1.4 L
FVC-PRE: 1.27 L
POST FEV6/FVC RATIO: 100 %
PRE FEV6/FVC RATIO: 99 %
Post FEV1/FVC ratio: 87 %
Pre FEV1/FVC ratio: 92 %

## 2018-01-17 LAB — CBC
HEMATOCRIT: 39.6 % (ref 39.0–52.0)
HEMOGLOBIN: 12.4 g/dL — AB (ref 13.0–17.0)
MCH: 25.9 pg — ABNORMAL LOW (ref 26.0–34.0)
MCHC: 31.3 g/dL (ref 30.0–36.0)
MCV: 82.8 fL (ref 78.0–100.0)
Platelets: 205 10*3/uL (ref 150–400)
RBC: 4.78 MIL/uL (ref 4.22–5.81)
RDW: 18.6 % — AB (ref 11.5–15.5)
WBC: 6.9 10*3/uL (ref 4.0–10.5)

## 2018-01-17 LAB — COMPREHENSIVE METABOLIC PANEL
ALBUMIN: 2.4 g/dL — AB (ref 3.5–5.0)
ALT: 316 U/L — ABNORMAL HIGH (ref 0–44)
ANION GAP: 9 (ref 5–15)
AST: 252 U/L — AB (ref 15–41)
Alkaline Phosphatase: 336 U/L — ABNORMAL HIGH (ref 38–126)
BUN: 24 mg/dL — AB (ref 6–20)
CHLORIDE: 100 mmol/L (ref 98–111)
CO2: 32 mmol/L (ref 22–32)
Calcium: 8.7 mg/dL — ABNORMAL LOW (ref 8.9–10.3)
Creatinine, Ser: 3.29 mg/dL — ABNORMAL HIGH (ref 0.61–1.24)
GFR calc Af Amer: 24 mL/min — ABNORMAL LOW (ref >60)
GFR calc non Af Amer: 21 mL/min — ABNORMAL LOW (ref >60)
GLUCOSE: 109 mg/dL — AB (ref 70–99)
POTASSIUM: 3.6 mmol/L (ref 3.5–5.1)
SODIUM: 141 mmol/L (ref 135–145)
Total Bilirubin: 3.9 mg/dL — ABNORMAL HIGH (ref 0.3–1.2)
Total Protein: 6.3 g/dL — ABNORMAL LOW (ref 6.5–8.1)

## 2018-01-17 LAB — GLUCOSE, CAPILLARY
GLUCOSE-CAPILLARY: 229 mg/dL — AB (ref 70–99)
Glucose-Capillary: 207 mg/dL — ABNORMAL HIGH (ref 70–99)
Glucose-Capillary: 98 mg/dL (ref 70–99)
Glucose-Capillary: 97 mg/dL (ref 70–99)
Glucose-Capillary: 108 mg/dL — ABNORMAL HIGH (ref 70–99)

## 2018-01-17 LAB — LIPASE, BLOOD: Lipase: 38 U/L (ref 11–51)

## 2018-01-17 MED ORDER — ALBUTEROL SULFATE (2.5 MG/3ML) 0.083% IN NEBU
2.5000 mg | INHALATION_SOLUTION | Freq: Once | RESPIRATORY_TRACT | Status: AC
  Administered 2018-01-17: 2.5 mg via RESPIRATORY_TRACT

## 2018-01-17 MED ORDER — HYDRALAZINE HCL 50 MG PO TABS
50.0000 mg | ORAL_TABLET | Freq: Three times a day (TID) | ORAL | Status: DC
  Administered 2018-01-17 – 2018-01-20 (×9): 50 mg via ORAL
  Filled 2018-01-17 (×9): qty 1

## 2018-01-17 MED ORDER — SODIUM CHLORIDE 0.9 % IV SOLN: INTRAVENOUS | Status: DC | PRN

## 2018-01-17 NOTE — Progress Notes (Signed)
01/17/18 @ 15:22. Per RN, pt refusing exam once again. Pt to leave AMA. Exam to be cancelled.

## 2018-01-17 NOTE — Progress Notes (Signed)
Internal Medicine Attending  Date: 01/17/2018  Patient name: Kenneth Fox E Kenneth Fox Medical record number: [id number] Date of birth: [birth year] Age: 47 y.o. Gender: male  I saw and evaluated the patient. I reviewed the resident's note by Dr. Karilyn Cotaehman and I agree with the resident's findings and plans as documented in her progress note.  When seen on rounds this morning [name] was without specific complaints at that time. Later in the afternoon he threatened to leave AGAINST MEDICAL ADVICE because he felt there were too many doctors without a specific plan, and he had not eaten since he had been here. The reason for the delay was explained to him and had to do with his difficulty with the MRI. With his morbid obesity, obesity hypoventilation syndrome, and obstructive sleep apnea we did not feel comfortable providing him with more significant sedation for the MRI without the ability to protect his airway. Therefore we are trying to arrange an MRCP under anesthesia. With regards to his "pulmonary clearance", that surgery requests, he underwent spirometry only today apparently because of scheduling issues in the PFT lab. My read of the spirometry is that there is no evidence of obstruction and possible significant restriction, which would need more formal lung volumes to confirm. A markedly reduced ERV is consistent with his body habitus. Some caution is needed in interpreting the spirometry given the inconsistent results when looking at the flow-volume loops. That said, I am concerned about Dr. Roxy CedarYoung's interpretation that there is a severe obstructive ventilatory defect. Thus, I am likely missing something.  We will consult pulmonary medicine in the morning to provide preoperative assessment given Dr. Roxy CedarYoung's spirometry interpretation and the risks associated with his morbid obesity, obesity-hypoventilation syndrome, and obstructive sleep apnea. I am hopeful that once pulmonary assesses the patient, makes  recommendations, and we are able to better identify the anatomy associated with his presentation via an MRCP we then may be able to proceed to surgery should it be indicated. With regards to other issues, we are increasing his hydration as I believe 75 mL an hour does not meet his insensible losses. In addition, we are increasing his hydralazine to 50 mg by mouth 3 times daily as ultimately we would like to avoid escalating either the beta blocker or clonidine in the setting of relative bradycardia.

## 2018-01-17 NOTE — Progress Notes (Addendum)
Subjective: [name] reported he is still feeling nauseated and having abdominal pain today. He said he is hungry and has not eaten since admission. He denies any SOB, chest pain, vomiting, fevers or chills. He said the MRCP was too tight and gave him too much anxiety.   He was seen later this afternoon because he was going to leave AMA. He said he felt like he was seeing too many doctors and didn't have a clear treatment plan. He was also frustrated with not having anything to eat since his admission. The severity of cholecystitis was explained to him. It was also explained to him that surgery warranted cardiac and pulmonary clearance and that the MRCP would help determine the extent of disease. He was also told the severity of cholecystitis if left untreated could lead to a severe infection and progress. He felt comfortable repeating the MRCP with anesthesia.   Objective:  Vital signs in last 24 hours: Vitals:   01/16/18 1700 01/16/18 1723 01/16/18 2214 01/17/18 0344  BP: (!) 155/95 (!) 155/95 126/82 132/85  Pulse:  (!) 49 (!) 52 (!) 46  Resp:  17 15 15   Temp:  97.6 F (36.4 C) (!) 97.3 F (36.3 C) 98 F (36.7 C)  TempSrc:  Oral Oral Oral  SpO2:  99% 100% 95%  Weight:      Height:       Physical Exam  Constitutional: He is oriented to person, place, and time and well-developed, well-nourished, and in no distress.  Cardiovascular: Normal rate, regular rhythm and normal heart sounds.  No murmur heard. Abdominal: Soft. Bowel sounds are normal. There is tenderness. There is no guarding.  Diffuse abdominal pain, mostly epigastric  Musculoskeletal: He exhibits edema.  Non-pitting edema  Neurological: He is alert and oriented to person, place, and time.  Respiratory: effort normal, diminished breath sounds bilateral lower lungs, on 3L [place]    Assessment/Plan:  Principal Problem:   Acute cholecystitis Active Problems:   Essential hypertension   OSA on CPAP   Diabetes mellitus  type 2, insulin dependent (HCC)   Oxygen dependent  [name] is a 47 year old male with a PMHx of diastolic CHF, obstructive sleep apnea, HTN, Type II DM, gastroparesis and morbid obesity presenting with abdominal pain, elevated LFT's and imaging suggestive of acute cholecystitis.Total bilirubin increased from 1.8 to 3.9, concerning for choledocholithiasis.  Acute cholecystitis:Patient is still complaining of nausea and abdominal tenderness. CT abdomen and US abdomen concerning for acute cholecystitis. Bilirubin has increased from 1.8 to 3.8, concerning for choledocholithiasis. Patient declined MRCP due to anxiety last night. Recommended repeat MRCP with anesthesia; patient agreed. Discussed patient with GI PA, Deitra MayoSarah Gribbins, and they did not recommend ERCP due to high risk for pancreatitis and to continue with MRCP with general anesthesia.   Bedside spirometry completed today to better assess pulmonary function for surgery; see results below.   -f/u MRCP with anesthesia -soft bland diet, npo after midnight  -continue zosyn  Bedside spirometry testing results:  Results for Kenneth Fox, Nat E (MRN [id number]) as of 01/17/2018 18:31  Ref. Range 01/17/2018 08:23  FVC-Pre Latest Units: L 1.27  FVC-%Pred-Pre Latest Units: % 28  FEV1-Pre Latest Units: L 1.16  FEV1-%Pred-Pre Latest Units: % 33  Pre FEV1/FVC ratio Latest Units: % 92  FEV1FVC-%Pred-Pre Latest Units: % 114  FEF 25-75 Pre Latest Units: L/sec 2.27  FEF2575-%Pred-Pre Latest Units: % 65  FEV6-Pre Latest Units: L 1.26  FEV6-%Pred-Pre Latest Units: % 29  Pre FEV6/FVC  Ratio Latest Units: % 99  FEV6FVC-%Pred-Pre Latest Units: % 102  FVC-Post Latest Units: L 1.40  FVC-%Pred-Post Latest Units: % 32  FVC-%Change-Post Latest Units: % 10  FEV1-Post Latest Units: L 1.22  FEV1-%Pred-Post Latest Units: % 34  FEV1-%Change-Post Latest Units: % 5  Post FEV1/FVC ratio Latest Units: % 87  FEV1FVC-%Change-Post Latest Units: % -4  FEF 25-75  Post Latest Units: L/sec 2.51  FEF2575-%Pred-Post Latest Units: % 72  FEF2575-%Change-Post Latest Units: % 10  FEV6-Post Latest Units: L 1.38  FEV6-%Pred-Post Latest Units: % 32  FEV6-%Change-Post Latest Units: % 9  Post FEV6/FVC ratio Latest Units: % 100  FEV6FVC-%Pred-Post Latest Units: % 103  FEV6FVC-%Change-Post Latest Units: % 0     HTN: BP 161/91, HR 53. Will increase hydralazine to 50 mg TID. Should consider weaning off of clonidine due to decrease in HR with both beta blocker and alpha adrenergic agonist activity. -continue clonidine 0.2 mg oral BID -continue hydralazine 50 mg TID -continue metoprolol 25 mg BID  Diastolic HF: Echo on 8/10- Mild RVE with RV dysfunction most likely due to OSA.  -see HTN therapy above    Elevated troponins:On his last admission, he was found to have elevated troponins 0.83, 0.60, 0.53 most likely demand ischemia, right heart strain due to underlying pulmonary disease. He denies chest pain and SOB. EKG shows unchanged t wave inversions.   CKD: Cr 3.29, baseline is 2.8. Patient has been NPO since admission, most likely attributed to dehydration. Continue to hold nephrotoxic agents -lasix, bumex, metolazone and metformin. -continue NS 75 ml/hr  Chronic hypoxic respiratory failure: He is stable on 3L [place]. No signs of volume overload or SOB. See spirometry results above. -continue 3L [place]   Obstructive sleep apnea and obesity hypoventilation syndrome: No respiratory distress today.  -CPAP qhs  Type II [id number]:Home medications include levemir 40 units qhs and novolog 10 units TID with SSI. Blood sugar today is 108. Will also monitor for gastroparesis flares.  -Levemir 10 units, novolog 10 units with SSI -Lyrica 100 mg TID PRN  -Reglan 10 mg IV x1  Peripheral neuropathy:Continue lyrica 100 mg TID.  Dispo: Anticipated discharge in approximately 2 day(s). Pending MRCP with anesthesia and surgery evaluation for cholecystectomy.   Kenneth Fox,  Garo [place]idelberg N, DO 01/17/2018, 10:46 AM Pager: [phone]

## 2018-01-17 NOTE — Progress Notes (Signed)
   Subjective/Chief Complaint: Nauseated but wants to eat, mild abd pain, had a "panic attack" when going for mrcp overnight  Objective: Vital signs in last 24 hours: Temp:  [97.3 F (36.3 C)-98.4 F (36.9 C)] 98 F (36.7 C) (08/12 0344) Pulse Rate:  [46-53] 46 (08/12 0344) Resp:  [15-17] 15 (08/12 0344) BP: (126-155)/(82-101) 132/85 (08/12 0344) SpO2:  [95 %-100 %] 95 % (08/12 0344) Last BM Date: 01/15/18  Intake/Output from previous day: 08/11 0701 - 08/12 0700 In: 450.8 [P.O.:360; I.V.:1.4; IV Piggyback:89.5] Out: 640 [Urine:640] Intake/Output this shift: No intake/output data recorded.  GI: obese bs present minimally tender epigastrium  Lab Results:  Recent Labs    01/16/18 0354 01/17/18 0704  WBC 7.0 6.9  HGB 11.8* 12.4*  HCT 37.9* 39.6  PLT 208 205   BMET Recent Labs    01/16/18 0354 01/17/18 0704  NA 140 141  K 4.1 3.6  CL 100 100  CO2 30 32  GLUCOSE 212* 109*  BUN 25* 24*  CREATININE 2.97* 3.29*  CALCIUM 8.5* 8.7*   PT/INR No results for input(s): LABPROT, INR in the last 72 hours. ABG No results for input(s): PHART, HCO3 in the last 72 hours.  Invalid input(s): PCO2, PO2  Studies/Results: No results found.  Anti-infectives: Anti-infectives (From admission, onward)   Start     Dose/Rate Route Frequency Ordered Stop   01/15/18 1200  piperacillin-tazobactam (ZOSYN) IVPB 3.375 g     3.375 g 12.5 mL/hr over 240 Minutes Intravenous Every 8 hours 01/15/18 0833     01/15/18 0530  piperacillin-tazobactam (ZOSYN) IVPB 3.375 g     3.375 g 100 mL/hr over 30 Minutes Intravenous  Once 01/15/18 0517 01/15/18 0720      Assessment/Plan: Sickle cell trait Acute on chronic renal insufficiency - Cr 3.29, baseline appears closer to 2.3 although Cr was 4.03 8/8  Type II Diabetes Mellitus with gastroparesis - per primary service HTN with hypertensive emergency - bp under better control Acute on Chronic Diastolic CHF -last echo 10/2016 with LVEF of  65-70% Morbid obesity BMI 45 OSA Hx of asthma Likely chronic cholecystitis, possible choledocholithiasis - ZO:XWRUCT:Mild soft tissue inflammation about the base of the gallbladder, with adjacent nodes measuring up to 1.3 cm in short axis, raising concern for acute cholecystitis - RUQ EA:VWUJWJS:Stones noted filling the gallbladder. Somewhat thickened gallbladder wall, without definite evidence of cholecystitis - Total bilirubin up still, did not tolerate mrcp, his major issue at this point appears to be possible choledocholithiasis, I will recheck lipase today also. I think needs mrcp or gi evaluation at this point.  He does need cholecystectomy at some point and may just end up needing drain with later surgery given his medical status.   - pulmonary function testing pending per medicine - ?Monday XBJ:YNWGNFEN:would leave npo until figure out next step FAO:ZHYQVTE:SCDs, ok to give lovenox from surgery standpoint MV:HQION:Zosyn 8/10>>  LOS: 1 day   Kenneth LoronMatthew Mennie Fox 01/17/2018

## 2018-01-17 NOTE — Progress Notes (Signed)
RT attempted to get an ABG from patient. RT got a venous sample instead. Patient was a very difficult stick. MD has been notified and is OK with the venous sample at this time. The following are the results.   VBG: PH 7.35 PO2 38.4 CO2 59.5 HCO3 32.8

## 2018-01-18 ENCOUNTER — Inpatient Hospital Stay (HOSPITAL_COMMUNITY): Payer: Medicare Other

## 2018-01-18 DIAGNOSIS — J810 Acute pulmonary edema: Secondary | ICD-10-CM

## 2018-01-18 DIAGNOSIS — R945 Abnormal results of liver function studies: Secondary | ICD-10-CM

## 2018-01-18 DIAGNOSIS — J449 Chronic obstructive pulmonary disease, unspecified: Secondary | ICD-10-CM

## 2018-01-18 DIAGNOSIS — G4733 Obstructive sleep apnea (adult) (pediatric): Secondary | ICD-10-CM

## 2018-01-18 DIAGNOSIS — R7989 Other specified abnormal findings of blood chemistry: Secondary | ICD-10-CM

## 2018-01-18 DIAGNOSIS — R935 Abnormal findings on diagnostic imaging of other abdominal regions, including retroperitoneum: Secondary | ICD-10-CM

## 2018-01-18 DIAGNOSIS — R1010 Upper abdominal pain, unspecified: Secondary | ICD-10-CM

## 2018-01-18 DIAGNOSIS — R948 Abnormal results of function studies of other organs and systems: Secondary | ICD-10-CM

## 2018-01-18 LAB — BASIC METABOLIC PANEL
Anion gap: 9 (ref 5–15)
BUN: 21 mg/dL — AB (ref 6–20)
CALCIUM: 8.5 mg/dL — AB (ref 8.9–10.3)
CO2: 31 mmol/L (ref 22–32)
Chloride: 102 mmol/L (ref 98–111)
Creatinine, Ser: 3.25 mg/dL — ABNORMAL HIGH (ref 0.61–1.24)
GFR calc Af Amer: 24 mL/min — ABNORMAL LOW (ref >60)
GFR, EST NON AFRICAN AMERICAN: 21 mL/min — AB (ref >60)
GLUCOSE: 174 mg/dL — AB (ref 70–99)
Potassium: 3.5 mmol/L (ref 3.5–5.1)
Sodium: 142 mmol/L (ref 135–145)

## 2018-01-18 LAB — GLUCOSE, CAPILLARY
GLUCOSE-CAPILLARY: 145 mg/dL — AB (ref 70–99)
GLUCOSE-CAPILLARY: 111 mg/dL — AB (ref 70–99)
Glucose-Capillary: 130 mg/dL — ABNORMAL HIGH (ref 70–99)
Glucose-Capillary: 153 mg/dL — ABNORMAL HIGH (ref 70–99)

## 2018-01-18 LAB — HEPATIC FUNCTION PANEL
ALT: 325 U/L — AB (ref 0–44)
AST: 219 U/L — ABNORMAL HIGH (ref 15–41)
Albumin: 2.2 g/dL — ABNORMAL LOW (ref 3.5–5.0)
Alkaline Phosphatase: 341 U/L — ABNORMAL HIGH (ref 38–126)
BILIRUBIN DIRECT: 3.1 mg/dL — AB (ref 0.0–0.2)
BILIRUBIN INDIRECT: 1.6 mg/dL — AB (ref 0.3–0.9)
BILIRUBIN TOTAL: 4.7 mg/dL — AB (ref 0.3–1.2)
Total Protein: 6.3 g/dL — ABNORMAL LOW (ref 6.5–8.1)

## 2018-01-18 MED ORDER — TECHNETIUM TC 99M MEBROFENIN IV KIT
5.0000 | PACK | Freq: Once | INTRAVENOUS | Status: AC | PRN
  Administered 2018-01-18: 5 via INTRAVENOUS

## 2018-01-18 NOTE — Progress Notes (Addendum)
   Subjective/Chief Complaint: Still with some epigastric pain, nausea   Objective: Vital signs in last 24 hours: Temp:  [97.8 F (36.6 C)-98.5 F (36.9 C)] 97.9 F (36.6 C) (08/13 0812) Pulse Rate:  [58-69] 58 (08/13 0812) Resp:  [14-18] 15 (08/13 0812) BP: (155-168)/(96-107) 167/107 (08/13 0812) SpO2:  [95 %-100 %] 99 % (08/13 0812) Last BM Date: 01/15/18  Intake/Output from previous day: No intake/output data recorded. Intake/Output this shift: No intake/output data recorded.  GI: soft mild tender epigastrium  Lab Results:  Recent Labs    01/16/18 0354 01/17/18 0704  WBC 7.0 6.9  HGB 11.8* 12.4*  HCT 37.9* 39.6  PLT 208 205   BMET Recent Labs    01/17/18 0704 01/18/18 0251  NA 141 142  K 3.6 3.5  CL 100 102  CO2 32 31  GLUCOSE 109* 174*  BUN 24* 21*  CREATININE 3.29* 3.25*  CALCIUM 8.7* 8.5*   PT/INR No results for input(s): LABPROT, INR in the last 72 hours. ABG No results for input(s): PHART, HCO3 in the last 72 hours.  Invalid input(s): PCO2, PO2  Studies/Results: No results found.  Anti-infectives: Anti-infectives (From admission, onward)   Start     Dose/Rate Route Frequency Ordered Stop   01/15/18 1200  piperacillin-tazobactam (ZOSYN) IVPB 3.375 g     3.375 g 12.5 mL/hr over 240 Minutes Intravenous Every 8 hours 01/15/18 0833     01/15/18 0530  piperacillin-tazobactam (ZOSYN) IVPB 3.375 g     3.375 g 100 mL/hr over 30 Minutes Intravenous  Once 01/15/18 0517 01/15/18 0720      Assessment/Plan: Sickle cell trait Acute on chronic renal insufficiency - Cr 3.25, baseline appears closer to 2.3 although Cr was 4.03 8/8  Type II Diabetes Mellitus with gastroparesis - per primary service HTN with hypertensive emergency - bp under better control Acute on Chronic Diastolic CHF -last echo 10/2016 with LVEF of 65-70% Morbid obesity BMI 45 OSA Hx of asthma Likely chronic cholecystitis, possible choledocholithiasis - ZO:XWRUCT:Mild soft tissue  inflammation about the base of the gallbladder, with adjacent nodes measuring up to 1.3 cm in short axis, raising concern for acute cholecystitis - RUQ EA:VWUJWJS:Stones noted filling the gallbladder. Somewhat thickened gallbladder wall, without definite evidence of cholecystitis -Total bilirubin pending today did not tolerate mrcp, his major issue at this point appears to be possible choledocholithiasis, lipase was normal.  I do not want him to undergo mrcp with anesthesia. I drew him picture and discussed with him today that we will proceed with hida scan.  If he has elevated lfts and does not fill bowel GI will need to see.  If he fills bowel and does not fill gb I think at this point a drain would be best.  I think he likely has been having symptoms all month and longer making this over the 10 day mark where I would consider perc chole.  He does need cholecystectomy at some point but that could be in six weeks. -I recommend a gi consult as well at this point due to increasing tb - pulmonary function testing pending per medicine - ?Monday XBJ:YNWGNFEN:would leave npo until figure out next step FAO:ZHYQVTE:SCDs, ok to give lovenox from surgery standpoint MV:HQION:Zosyn 8/10>>  Emelia LoronMatthew Desmond Tufano 01/18/2018

## 2018-01-18 NOTE — Consult Note (Addendum)
[facility] Consult: 2:21 PM 01/18/2018  LOS: 3 days    Referring Provider: Dr. Eppie Gibson Primary Care Physician:  Shanon Rosser, PA-C at [facility] urgent care. Primary Gastroenterologist: unassigned   Reason for Consultation: Rising LFTs, question choledocholithiasis.   HPI: Kenneth Fox is a 47 y.o. male.  PMH IDDM.  OSA.  Obesity hypoventilation syndrome. CKD.  Diabetic neuropathy.  Diabetic gastroparesis on gastric emptying study in 2014, on Reglan.  No previous upper endoscopy or colonoscopy.  Admission inpt 8/5 - 01/13/18 with AKI.  Elevated Troponinsto 0.82, cardiology fup planned.  LFTs were normal; Lipase was 111 on 8/3 PTA but subsequently normal and CT abdomen pelvis unremarkable.    Patient readmitted with what he says is an annual bout of nausea and upper abdominal pain associated with p.o. intake.  Emesis limited to clear material.  Symptoms are controlled so long as he does not eat or drink.  On previous imaging, CT scans of the abdomen pelvis date back to 2012 has had various findings including fatty liver, ventral/umbilical hernia.  No gallbladder, biliary ductal or pancreatic abnormalities.  Latest prior CT was on 01/08/2018 and showed nothing acute to explain patient's abdominal pain or nausea and vomiting.  At times his GI symptoms were attributed to his gastroparesis.  However in between these "annual" symptoms of nausea vomiting and abdominal pain, he actually tolerates p.o. well and does not have reflux symptoms or digestive issues.  Patient was readmitted 01/14/2018 with recurrent GI symptoms. T bili: 1.8 >>3.9 >> 3.9 >> 4.7  Alk phos: 215 >> 291 >> 336 >> 341. AST/ALT: 247/124 >> 265/266 >> 252/316 >> 219/325.  Lipase 76 >> 38.   Acute hepatitis panel negative.    WBCs not elevated.     01/15/2018 CT abdomen/pelvis without contrast.  Mild soft tissue inflammation at the base of gallbladder with adjacent nodes, measuring up to 1.3 cm, in the short axis raising concern for acute cholecystitis.  Common bile duct normal in caliber.  Liver unremarkable mild SQ edema along anterior abdominal wall.  Mild BB airspace opacities, likely reflecting atelectasis. 01/15/2018 abdominal ultrasound: Stones filling gallbladder.  Somewhat thickened GB wall but no definite evidence for cholecystitis.  CBD diameter 3 mm.   Patient currently undergoing HIDA scan. Since n.p.o. today his GI symptoms have greatly improved.  Did report nausea and pain after taking his oral meds this morning, same symptoms when he tried to eat solid food yesterday.  + AKI.   No coagulopathy.   Patient does not drink alcoholic beverages. Family history pertinent for younger sister who just had cholecystectomy at age 13.  No family history of colon cancer, GI bleeds, anemia, peptic ulcer disease, liver or pancreatic disease.   Past Medical History:  Diagnosis Date  . Asthma   . CHF (congestive heart failure) ([place])   . Diabetes ([place])   . Gastroparesis   . GERD (gastroesophageal reflux disease)   . Hypertension   . Kidney disorder   . Neuropathy   . Obesity   . Obstructive sleep apnea  will not wear CPAP  . Renal insufficiency   . Sickle cell trait [facility])     Past Surgical History:  Procedure Laterality Date  . HIP FRACTURE SURGERY Right 1999   "put a plate in"    Prior to Admission medications   Medication Sig Start Date End Date Taking? Authorizing Provider  acetaminophen (TYLENOL) 500 MG tablet Take 1 tablet (500 mg total) by mouth every 6 (six) hours as needed. Patient taking differently: Take 500 mg by mouth every 6 (six) hours as needed for mild pain or headache.  02/05/17  Yes Law, Bea Graff, PA-C  hydrALAZINE (APRESOLINE) 25 MG tablet Take 1 tablet (25 mg total) by mouth 3 (three) times daily.  03/07/17  Yes Rosita Fire, MD  insulin aspart (NOVOLOG) 100 UNIT/ML injection Inject 10-20 Units into the skin 3 (three) times daily with meals. per sliding scale CBG 70 - 120: 0 units CBG 121 - 150: 2 units CBG 151 - 200: 3 units CBG 201 - 250: 5 units CBG 251 - 300: 8 units CBG 301 - 350: 11 units CBG 351 - 400: 15 units 06/06/15  Yes Robbie Lis, MD  insulin detemir (LEVEMIR) 100 UNIT/ML injection Inject 0.8 mLs (80 Units total) into the skin every evening. Patient taking differently: Inject 40 Units into the skin 2 (two) times daily.  12/13/15  Yes Rai, Ripudeep K, MD  metoCLOPramide (REGLAN) 10 MG tablet Take 0.5 tablets (5 mg total) by mouth every 8 (eight) hours as needed for nausea. 01/08/18  Yes Dorie Rank, MD  Oxycodone HCl 10 MG TABS Take 10 mg by mouth every 6 (six) hours as needed for pain.   Yes [provider]  polyethylene glycol (MIRALAX / GLYCOLAX) packet Take 17 g by mouth daily as needed for mild constipation. 01/28/17  Yes Davonna Belling, MD  pregabalin (LYRICA) 75 MG capsule Take 1 capsule (75 mg total) by mouth 2 (two) times daily. 01/13/18  Yes Rehman, Areeg N, DO  cloNIDine (CATAPRES) 0.2 MG tablet Take 0.2 mg by mouth 2 (two) times daily. 01/06/18   [provider]  metoprolol tartrate (LOPRESSOR) 25 MG tablet Take 1 tablet (25 mg total) by mouth 2 (two) times daily. Patient not taking: Reported on 01/15/2018 10/26/16   Dhungel, Flonnie Overman, MD    Scheduled Meds: . cloNIDine  0.2 mg Oral BID  . enoxaparin (LOVENOX) injection  75 mg Subcutaneous Daily  . hydrALAZINE  50 mg Oral TID  . insulin aspart  0-15 Units Subcutaneous TID WC  . insulin detemir  10 Units Subcutaneous QHS  . metoprolol tartrate  25 mg Oral BID  . sodium chloride flush  3 mL Intravenous Q12H   Infusions: . sodium chloride    . piperacillin-tazobactam (ZOSYN)  IV 3.375 g (01/18/18 1332)   PRN Meds: sodium chloride, acetaminophen **OR** acetaminophen, HYDROmorphone  (DILAUDID) injection, ondansetron **OR** ondansetron (ZOFRAN) IV, polyethylene glycol, pregabalin, promethazine **OR** promethazine **OR** promethazine, senna-docusate   Allergies as of 01/14/2018 - Review Complete 01/14/2018  Allergen Reaction Noted  . Angiotensin receptor blockers Other (See Comments) 06/04/2015  . Lisinopril Other (See Comments) 12/03/2013    Family History  Problem Relation Age of Onset  . Sickle cell anemia Mother   . Heart attack Mother   . Diabetes Father   . Neuropathy Father   . Hypertension Father   . Aneurysm Father   . Kidney disease Brother   . Kidney disease Sister     Social History  Socioeconomic History  . Marital status: Married    Spouse name: Not on file  . Number of children: 4  . Years of education: Not on file  . Highest education level: Not on file  Occupational History  . Occupation: disabled  Social Needs  . Financial resource strain: Not on file  . Food insecurity:    Worry: Not on file    Inability: Not on file  . Transportation needs:    Medical: Not on file    Non-medical: Not on file  Tobacco Use  . Smoking status: Never Smoker  . Smokeless tobacco: Never Used  Substance and Sexual Activity  . Alcohol use: No  . Drug use: No  . Sexual activity: Yes    Partners: Female    Comment: married so no birth control  Lifestyle  . Physical activity:    Days per week: Not on file    Minutes per session: Not on file  . Stress: Not on file  Relationships  . Social connections:    Talks on phone: Not on file    Gets together: Not on file    Attends religious service: Not on file    Active member of club or organization: Not on file    Attends meetings of clubs or organizations: Not on file    Relationship status: Not on file  . Intimate partner violence:    Fear of current or ex partner: Not on file    Emotionally abused: Not on file    Physically abused: Not on file    Forced sexual activity: Not on file  Other  Topics Concern  . 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 Then in [place] was worked as Biomedical scientist for [employer] before getting "sick with neuropathy". Out of work for 4 years.     REVIEW OF SYSTEMS: Constitutional: No weakness.  Mild fatigue with moderate activity. ENT:  No nose bleeds Pulm: Stable dyspnea with moderate exertion.  For a while patient was not using CPAP because he did not have a mask but recently got a new mask and has been using CPAP once again. CV:  No palpitations, no LE edema.  No chest pain GU:  No hematuria, no frequency GI:  Per HPI Heme: Denies unusual or excessive bleeding/bruising. Transfusions: Has never undergone previous blood transfusion. Neuro:  No headaches, no peripheral tingling or numbness Derm:  No itching, no rash or sores.  Endocrine: Sugars not well controlled.  Often run in the 200s to 400s at home.  No sweats or chills.  No polyuria or dysuria Immunization: Reviewed Travel:  None beyond local counties in last few months.    PHYSICAL EXAM: Vital signs in last 24 hours: Vitals:   01/18/18 0812 01/18/18 1225  BP: (!) 167/107 (!) 158/88  Pulse: (!) 58 (!) 45  Resp: 15 15  Temp: 97.9 F (36.6 C) 97.6 F (36.4 C)  SpO2: 99% 100%   Wt Readings from Last 3 Encounters:  01/14/18 (!) 148.3 kg  01/13/18 (!) 152.3 kg  01/08/18 (!) 148.3 kg    General: Patient seen while laying in nuclear med undergoing HIDA scan.  Looks comfortable.  Morbidly obese, looks somewhat chronically ill. Head: No facial asymmetry or swelling.  No signs of head trauma. Eyes: No scleral icterus.  No conjunctival pallor.  EOMI. Ears: Not hard of hearing.  Nose: No discharge or congestion. Mouth: Somewhat dry tongue with rusty discoloration on the tongue.  Fair dentition.  Tongue midline. Neck: No masses or thyromegaly.  No JVD. Lungs: No labored breathing.  Periodic slightly mucoid  cough.  No dyspnea.  Limited auscultation with clear lungs.  Gynecomastia. Heart: Very distant, barely audible heart sounds.  Regular rate on radial pulse. Abdomen: Obese.  Soft.  Not tender.  Bowel sounds normal and somewhat hypoactive.  No HSM, masses, bruits, or obvious hernia.   Rectal: Deferred Musc/Skeltl: Incomplete exam but grossly normal without contractures or significant deformity Extremities: Non-pitting pedal edema bilaterally.  Feet are warm. Neurologic: Alert.  Oriented x3.  Able to move all 4 limbs.  No tremor at rest.  No gross deficits. Skin: Incomplete exam but no obvious sores, rashes, suspicious lesions. Tattoos: None observed Nodes: No cervical adenopathy. Psych: Pleasant, cooperative, calm.  Intake/Output from previous day: No intake/output data recorded. Intake/Output this shift: No intake/output data recorded.  LAB RESULTS: Recent Labs    01/16/18 0354 01/17/18 0704  WBC 7.0 6.9  HGB 11.8* 12.4*  HCT 37.9* 39.6  PLT 208 205   BMET Lab Results  Component Value Date   NA 142 01/18/2018   NA 141 01/17/2018   NA 140 01/16/2018   K 3.5 01/18/2018   K 3.6 01/17/2018   K 4.1 01/16/2018   CL 102 01/18/2018   CL 100 01/17/2018   CL 100 01/16/2018   CO2 31 01/18/2018   CO2 32 01/17/2018   CO2 30 01/16/2018   GLUCOSE 174 (H) 01/18/2018   GLUCOSE 109 (H) 01/17/2018   GLUCOSE 212 (H) 01/16/2018   BUN 21 (H) 01/18/2018   BUN 24 (H) 01/17/2018   BUN 25 (H) 01/16/2018   CREATININE 3.25 (H) 01/18/2018   CREATININE 3.29 (H) 01/17/2018   CREATININE 2.97 (H) 01/16/2018   CALCIUM 8.5 (L) 01/18/2018   CALCIUM 8.7 (L) 01/17/2018   CALCIUM 8.5 (L) 01/16/2018   LFT Recent Labs    01/16/18 0354 01/16/18 0640 01/17/18 0704 01/18/18 0832  PROT 5.9*  --  6.3* 6.3*  ALBUMIN 2.4*  --  2.4* 2.2*  AST 265*  --  252* 219*  ALT 266*  --  316* 325*  ALKPHOS 291*  --  336* 341*  BILITOT 3.9* 3.8* 3.9* 4.7*  BILIDIR  --  2.4*  --  3.1*  IBILI  --  1.4*  --   1.6*   PT/INR Lab Results  Component Value Date   INR 1.07 06/01/2015   INR 1.00 09/06/2013   INR 0.90 06/30/2010   Hepatitis Panel No results for input(s): HEPBSAG, HCVAB, HEPAIGM, HEPBIGM in the last 72 hours. C-Diff No components found for: CDIFF Lipase     Component Value Date/Time   LIPASE 38 01/17/2018 0704    Drugs of Abuse     Component Value Date/Time   LABOPIA NONE DETECTED 10/22/2016 0048   COCAINSCRNUR NONE DETECTED 10/22/2016 0048   LABBENZ NONE DETECTED 10/22/2016 0048   AMPHETMU NONE DETECTED 10/22/2016 0048   THCU NONE DETECTED 10/22/2016 0048   LABBARB NONE DETECTED 10/22/2016 0048       IMPRESSION:   *   ?  Question chronic cholecystitis. Rising LFTs raising suspicion for choledocholithiasis, however on ultrasound and CT performed 3 days ago, there were no biliary ductal abnormalities.  *   Chronic fatty liver dating back to CT of 2012.  *    IDDM.  Not well controlled.  There is run  from the 200s to 400s at home.  His latest hemoglobin A1c was 10 in September 2018. Diabetic neuropathy. Gastroparesis on GES in 2014   PLAN:     *    Await findings on HIDA scan.  *   Discussed the case with Dr. Rush Landmark.  Patient has a tentative slot for EUS/ERCP tomorrow at 11 AM.     Azucena Freed  01/18/2018, 2:21 PM Phone [phone]

## 2018-01-18 NOTE — Progress Notes (Signed)
Placed pt on CPAP with 3lpm bled in. Pt tolerating well and comfortable.

## 2018-01-18 NOTE — Care Management Important Message (Signed)
Important Message  Patient Details  Name: Kenneth Fox MRN: [id number] Date of Birth: [birth year]   Medicare Important Message Given:  No  Due to illness patient not able to sign/Unsigbed copy left [name] 01/18/2018, 11:59 AM

## 2018-01-18 NOTE — Progress Notes (Addendum)
Subjective: Kenneth Fox reported feeling nauseated and having abdominal pain prior to taking his pain medications this morning. He said he was able to tolerate PO intake yesterday during lunch but felt nauseated when he ate dinner. He denied anymore episodes of vomiting, SOB, fevers or chills. He felt better knowing the MRCP will be under general anesthesia today.   Objective:  Vital signs in last 24 hours: Vitals:   01/18/18 0023 01/18/18 0300 01/18/18 0812 01/18/18 1225  BP: (!) 155/101 (!) 167/96 (!) 167/107 (!) 158/88  Pulse: 63  (!) 58 (!) 45  Resp: 14 14 15 15   Temp: 98.5 F (36.9 C) 98.4 F (36.9 C) 97.9 F (36.6 C) 97.6 F (36.4 C)  TempSrc: Axillary Axillary Oral Oral  SpO2: 95% 100% 99% 100%  Weight:      Height:       Physical Exam  Constitutional: He is oriented to person, place, and time and well-developed, well-nourished, and in no distress.  Cardiovascular: Normal rate, regular rhythm and normal heart sounds.  No murmur heard. Pulmonary/Chest: Effort normal. No respiratory distress. He has no wheezes.  Abdominal: Soft. Bowel sounds are normal. He exhibits no distension. There is no tenderness. There is no guarding.  Neurological: He is alert and oriented to person, place, and time.    Assessment/Plan:  Principal Problem:   Acute cholecystitis Active Problems:   Essential hypertension   OSA on CPAP   Diabetes mellitus type 2, insulin dependent (HCC)   Oxygen dependent  Kenneth Fox is a 47 year old male with a PMHx of diastolic CHF, obstructive sleep apnea, HTN, Type II DM, gastroparesis and morbid obesity presenting with abdominal pain, elevated LFT's and imaging suggestive of acute cholecystitis.Total bilirubin increased from 1.8 to 3.9 to 4.7 today, concerning for choledocholithiasis. Awaiting HIDA scan findings.  Acute cholecystitis with possible choledocholithiasis: Patient's total bilirubin has increased to 4.7 today, pending HIDA scan. Dr. Dwain SarnaWakefield  recommended a HIDA scan would be a better option given the patient's anxiety and comorbidities. He is recommending a percutaneous drain if the HIDA scan does not indicate there is a stone. GI was consulted yesterday and they did not advise an ERCP that time. Spoke with Dr. Meridee ScoreMansouraty today, recommending EUS/ERCP tomorrow if HIDA scan shows evidence of choledocholithiasis or is inconclusive. Rising bilirubin is concerning; however, without evidence of CBD dilation choledocholithiasis is questionable per GI. Awaiting findngs on HIDA scan.  -Pulmonary has also been consulted due to their interpretation of spirometry  -f/u HIDA scan -f/u with GI and surgery  -NPO after midnight -hold Lovenox   HTN: 158/88, HR 43. Will recommend weaning patient off of clonidine due to low HR's on both metoprolol and clonidine therapy.  -continue clonidine 0.2 mg oral BID -continue hydralazine 50 mg TID -continue metoprolol 25 mg BID  Diastolic HF:  -see HTN therapy above   CKD: Cr 3.25 today, baseline 2.8, most likely in the setting of dehydration. Patient was NPO until yesterday. Increased his NS to 125 mL/hr. -f/u cmp -continue NS 125 mL/hr  Chronic hypoxic respiratory failure: Stable on 3L [place]. No signs of volume overload or SOB. Bilateral LE edema is baseline. Pulmonary consulted due to their interpretation of spirometry results from yesterday.  -continue 3L [place] -f/u pulmonary  OSA and obesity hypoventilation syndrome: No respiratory distress.  -CPAP qhs  Type II DM: CBG 145.  -Levemir 10 units, novolog 10 units with SSI -Lyrica 100 mg TID PRN  -Reglan 10 mg IV x1 -continue lyrica  100 mg TID    Dispo: Anticipated discharge in approximately 2 day(s).   Aveena Bari N, DO 01/18/2018, 1:34 PM Pager: [phone]

## 2018-01-18 NOTE — Progress Notes (Signed)
Internal Medicine Attending  Date: 01/18/2018  Patient name: Kenneth Fox Medical record number: [id number] Date of birth: February 26, 1971 Age: 47 y.o. Gender: male  I saw and evaluated the patient. I reviewed the resident's note by Dr. Karilyn Cotaehman and I agree with the resident's findings and plans as documented in her progress note.  When seen on rounds this morning Kenneth Fox was without any acute complaints. He was explained the plan for the day which he was satisfied with. This was modified in that he underwent a HIDA scan instead and this was suggestive of choledocholithiasis, especially with the rising total bilirubin. He is scheduled for an ERCP and EUS tomorrow at 11 AM to further evaluate.
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
## 2018-01-18 NOTE — Progress Notes (Signed)
Pharmacy Antibiotic Note  Kenneth Fox is a 47 y.o. male admitted on 01/14/2018 with abdominal pain and nausea since discharge recently.  Imaging shows stones in gallbladder, possible cholangitis.  Pharmacy has been consulted for Zosyn dosing for intra-abdominal infection.  SCr trending up to 3.25, CrCL 40.6 ml/min, afebrile, WBC WNL.   Plan: Continue Zosyn EID 3.375gm IV Q8H until source controlled Monitor renal fxn, clinical progress F/u LOT   Height: 5\' 11"  (180.3 cm) Weight: (!) 327 lb (148.3 kg) IBW/kg (Calculated) : 75.3  Temp (24hrs), Avg:98.1 F (36.7 C), Min:97.6 F (36.4 C), Max:98.5 F (36.9 C)  Recent Labs  Lab 01/13/18 0701 01/14/18 2351 01/16/18 0354 01/17/18 0704 01/18/18 0251  WBC  --  8.8 7.0 6.9  --   CREATININE 4.03* 2.96* 2.97* 3.29* 3.25*    Estimated Creatinine Clearance: 40.6 mL/min (A) (by C-G formula based on SCr of 3.25 mg/dL (H)).    Allergies  Allergen Reactions  . Angiotensin Receptor Blockers Other (See Comments)    Acute renal failure in patient w R heart failure  . Lisinopril Other (See Comments)    Patient developed AKI after being on lisinopril for a week.     Zosyn 8/10 >>  8/10 BCx -   Lenward Chancelloranya Makhlouf, PharmD PGY1 Pharmacy Resident Phone [phone] 01/18/2018 12:33 PM

## 2018-01-18 NOTE — Care Management Important Message (Signed)
Important Message  Patient Details  Name: Lucky Rathkehomas E Wierzba MRN: [id number] Date of Birth: 1970/06/22   Medicare Important Message Given:  Yes    Edgar Reisz Stefan ChurchBratton 01/18/2018, 10:18 AM

## 2018-01-19 ENCOUNTER — Inpatient Hospital Stay (HOSPITAL_COMMUNITY): Payer: Medicare Other

## 2018-01-19 ENCOUNTER — Inpatient Hospital Stay (HOSPITAL_COMMUNITY): Payer: Medicare Other | Admitting: Anesthesiology

## 2018-01-19 ENCOUNTER — Encounter (HOSPITAL_COMMUNITY): Payer: Self-pay | Admitting: Anesthesiology

## 2018-01-19 ENCOUNTER — Encounter (HOSPITAL_COMMUNITY)
Admission: EM | Disposition: A | Discharge status: Home / Self Care | Payer: Self-pay | Source: Home / Self Care | Attending: Internal Medicine

## 2018-01-19 DIAGNOSIS — K8031 Calculus of bile duct with cholangitis, unspecified, with obstruction: Secondary | ICD-10-CM

## 2018-01-19 DIAGNOSIS — K8042 Calculus of bile duct with acute cholecystitis without obstruction: Secondary | ICD-10-CM

## 2018-01-19 DIAGNOSIS — K8043 Calculus of bile duct with acute cholecystitis with obstruction: Secondary | ICD-10-CM

## 2018-01-19 HISTORY — PX: REMOVAL OF STONES: SHX5545

## 2018-01-19 HISTORY — PX: ENDOSCOPIC RETROGRADE CHOLANGIOPANCREATOGRAPHY (ERCP) WITH PROPOFOL: SHX5810

## 2018-01-19 HISTORY — PX: SPHINCTEROTOMY: SHX5279

## 2018-01-19 LAB — COMPREHENSIVE METABOLIC PANEL
ALBUMIN: 2.2 g/dL — AB (ref 3.5–5.0)
ALK PHOS: 337 U/L — AB (ref 38–126)
ALT: 276 U/L — AB (ref 0–44)
ANION GAP: 9 (ref 5–15)
AST: 127 U/L — ABNORMAL HIGH (ref 15–41)
BILIRUBIN TOTAL: 2.9 mg/dL — AB (ref 0.3–1.2)
BUN: 18 mg/dL (ref 6–20)
CALCIUM: 8.5 mg/dL — AB (ref 8.9–10.3)
CO2: 27 mmol/L (ref 22–32)
CREATININE: 2.93 mg/dL — AB (ref 0.61–1.24)
Chloride: 105 mmol/L (ref 98–111)
GFR calc non Af Amer: 24 mL/min — ABNORMAL LOW (ref >60)
GFR, EST AFRICAN AMERICAN: 27 mL/min — AB (ref >60)
GLUCOSE: 135 mg/dL — AB (ref 70–99)
Potassium: 3.6 mmol/L (ref 3.5–5.1)
Sodium: 141 mmol/L (ref 135–145)
TOTAL PROTEIN: 5.8 g/dL — AB (ref 6.5–8.1)

## 2018-01-19 LAB — GLUCOSE, CAPILLARY
GLUCOSE-CAPILLARY: 141 mg/dL — AB (ref 70–99)
GLUCOSE-CAPILLARY: 277 mg/dL — AB (ref 70–99)
Glucose-Capillary: 115 mg/dL — ABNORMAL HIGH (ref 70–99)
Glucose-Capillary: 342 mg/dL — ABNORMAL HIGH (ref 70–99)

## 2018-01-19 SURGERY — ENDOSCOPIC RETROGRADE CHOLANGIOPANCREATOGRAPHY (ERCP) WITH PROPOFOL
Anesthesia: General

## 2018-01-19 MED ORDER — SODIUM CHLORIDE 0.9 % IV SOLN
INTRAVENOUS | Status: DC | PRN
  Administered 2018-01-19: 40 mL

## 2018-01-19 MED ORDER — METOPROLOL TARTRATE 5 MG/5ML IV SOLN: 5.0000 mg | Freq: Four times a day (QID) | INTRAVENOUS | Status: DC | PRN

## 2018-01-19 MED ORDER — SODIUM CHLORIDE 0.9 % IV SOLN
INTRAVENOUS | Status: DC | PRN
  Administered 2018-01-19: 30 ug/min via INTRAVENOUS

## 2018-01-19 MED ORDER — INDOMETHACIN 50 MG RE SUPP
RECTAL | Status: DC | PRN
  Administered 2018-01-19: 100 mg via RECTAL

## 2018-01-19 MED ORDER — SODIUM CHLORIDE 0.9 % IV SOLN
INTRAVENOUS | Status: DC
  Administered 2018-01-19 (×2): via INTRAVENOUS

## 2018-01-19 MED ORDER — GLUCAGON HCL RDNA (DIAGNOSTIC) 1 MG IJ SOLR
INTRAMUSCULAR | Status: DC | PRN
  Administered 2018-01-19 (×2): 0.25 mg via INTRAVENOUS

## 2018-01-19 MED ORDER — SUGAMMADEX SODIUM 200 MG/2ML IV SOLN
INTRAVENOUS | Status: DC | PRN
  Administered 2018-01-19: 400 mg via INTRAVENOUS

## 2018-01-19 MED ORDER — LACTATED RINGERS IV SOLN: INTRAVENOUS | Status: DC

## 2018-01-19 MED ORDER — ROCURONIUM BROMIDE 10 MG/ML (PF) SYRINGE
PREFILLED_SYRINGE | INTRAVENOUS | Status: DC | PRN
  Administered 2018-01-19: 50 mg via INTRAVENOUS

## 2018-01-19 MED ORDER — INDOMETHACIN 50 MG RE SUPP
RECTAL | Status: AC
  Filled 2018-01-19: qty 2

## 2018-01-19 MED ORDER — SUCCINYLCHOLINE CHLORIDE 20 MG/ML IJ SOLN
INTRAMUSCULAR | Status: DC | PRN
  Administered 2018-01-19: 140 mg via INTRAVENOUS

## 2018-01-19 MED ORDER — ALBUTEROL SULFATE HFA 108 (90 BASE) MCG/ACT IN AERS
INHALATION_SPRAY | RESPIRATORY_TRACT | Status: DC | PRN
  Administered 2018-01-19: 6 via RESPIRATORY_TRACT

## 2018-01-19 MED ORDER — INDOMETHACIN 50 MG RE SUPP: 100.0000 mg | Freq: Once | RECTAL | Status: DC

## 2018-01-19 MED ORDER — ALBUTEROL SULFATE (2.5 MG/3ML) 0.083% IN NEBU
INHALATION_SOLUTION | RESPIRATORY_TRACT | Status: AC
  Filled 2018-01-19: qty 3

## 2018-01-19 MED ORDER — PROPOFOL 10 MG/ML IV BOLUS
INTRAVENOUS | Status: DC | PRN
  Administered 2018-01-19: 200 mg via INTRAVENOUS

## 2018-01-19 MED ORDER — DEXAMETHASONE SODIUM PHOSPHATE 10 MG/ML IJ SOLN
INTRAMUSCULAR | Status: DC | PRN
  Administered 2018-01-19: 10 mg via INTRAVENOUS

## 2018-01-19 MED ORDER — ALBUTEROL SULFATE (2.5 MG/3ML) 0.083% IN NEBU
2.5000 mg | INHALATION_SOLUTION | Freq: Once | RESPIRATORY_TRACT | Status: AC
  Administered 2018-01-19: 2.5 mg via RESPIRATORY_TRACT

## 2018-01-19 MED ORDER — GLUCAGON HCL RDNA (DIAGNOSTIC) 1 MG IJ SOLR
INTRAMUSCULAR | Status: AC
  Filled 2018-01-19: qty 1

## 2018-01-19 MED ORDER — MIDAZOLAM HCL 2 MG/2ML IJ SOLN
INTRAMUSCULAR | Status: DC | PRN
  Administered 2018-01-19: 2 mg via INTRAVENOUS

## 2018-01-19 MED ORDER — IOPAMIDOL (ISOVUE-300) INJECTION 61%
INTRAVENOUS | Status: AC
  Filled 2018-01-19: qty 50

## 2018-01-19 MED ORDER — LIDOCAINE 2% (20 MG/ML) 5 ML SYRINGE
INTRAMUSCULAR | Status: DC | PRN
  Administered 2018-01-19: 100 mg via INTRAVENOUS

## 2018-01-19 MED ORDER — PROMETHAZINE HCL 25 MG/ML IJ SOLN: 6.2500 mg | INTRAMUSCULAR | Status: DC | PRN

## 2018-01-19 MED ORDER — MEPERIDINE HCL 100 MG/ML IJ SOLN: 6.2500 mg | INTRAMUSCULAR | Status: DC | PRN

## 2018-01-19 NOTE — Progress Notes (Signed)
   Subjective/Chief Complaint: Less abd pain today   Objective: Vital signs in last 24 hours: Temp:  [97.5 F (36.4 C)-97.9 F (36.6 C)] 97.6 F (36.4 C) (08/14 0849) Pulse Rate:  [45-64] 64 (08/14 0849) Resp:  [15] 15 (08/13 1225) BP: (148-193)/(72-118) 176/97 (08/14 0849) SpO2:  [100 %] 100 % (08/14 0849) Last BM Date: 01/15/18  Intake/Output from previous day: No intake/output data recorded. Intake/Output this shift: No intake/output data recorded.  GI: nontender obese  Lab Results:  Recent Labs    01/17/18 0704  WBC 6.9  HGB 12.4*  HCT 39.6  PLT 205   BMET Recent Labs    01/18/18 0251 01/19/18 0338  NA 142 141  K 3.5 3.6  CL 102 105  CO2 31 27  GLUCOSE 174* 135*  BUN 21* 18  CREATININE 3.25* 2.93*  CALCIUM 8.5* 8.5*   PT/INR No results for input(s): LABPROT, INR in the last 72 hours. ABG No results for input(s): PHART, HCO3 in the last 72 hours.  Invalid input(s): PCO2, PO2  Studies/Results: Nm Hepatobiliary Liver Func  Result Date: 01/18/2018 CLINICAL DATA:  Abdominal pain with nausea and vomiting EXAM: NUCLEAR MEDICINE HEPATOBILIARY IMAGING TECHNIQUE: Sequential images of the abdomen were obtained out to 120 minutes following intravenous administration of radiopharmaceutical. RADIOPHARMACEUTICALS:  5.4 mCi Tc-4535m  Choletec IV COMPARISON:  CT abdomen and pelvis January 15, 2018; ultrasound right upper quadrant January 15, 2018 FINDINGS: Liver uptake of radiotracer is unremarkable. After 2 hours, neither gallbladder nor small bowel visualizes. IMPRESSION: Nonvisualization of gallbladder and small bowel after 2 hours. While the patient's bilirubin is elevated, this degree of bilirubin elevation is not felt to be sufficient to cause nonvisualization of small bowel. This overall appearance raises concern for obstruction of both cystic and common bile ducts. From an imaging standpoint, abdominal MR/MRCP would be optimal for further assessment in this regard,  in particular to assess for potential mass or calculus causing this these sonographic abnormalities. Cholangitis is a differential consideration. Electronically Signed   By: Bretta BangWilliam  Woodruff III M.D.   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 Somewhat thickened gallbladder wall, without definite evidence of cholecystitis -HIDA showed nonfilling gb and bowel I think consistent with likely stone in bile duct, lfts are better today, has plans with GI.    I think he likely has been having symptoms all month and longer making this over the 10 day mark where I would consider perc chole after gi procedureHe does need cholecystectomy at some point but that could be in six weeks. I discussed this plan with him again today and I have drawn pictures to attempt to explain.  XBJ:YNWGNFEN:would leave npo until figure out next step FAO:ZHYQVTE:SCDs, ok to give lovenox from surgery standpoint MV:HQION:Zosyn 8/10>>  Emelia LoronMatthew Glynn Yepes 01/19/2018

## 2018-01-19 NOTE — Progress Notes (Deleted)
Called and gave report, patient is going to 4W06 but the room is currently still dirty.   Nurse to call me back once the room is clean and we will transfer patient.  Patient resting comfortably in his room.  

## 2018-01-19 NOTE — Progress Notes (Signed)
IR aware aware of request for percutaneous cholecystomy placement - patient discussed with Dr. Fredia SorrowYamagata. Plan for US RUQ tomorrow AM with further plan per result.

## 2018-01-19 NOTE — Transfer of Care (Signed)
Immediate Anesthesia Transfer of Care Note  Patient: Kenneth Fox  Procedure(s) Performed: ENDOSCOPIC RETROGRADE CHOLANGIOPANCREATOGRAPHY (ERCP) WITH PROPOFOL (N/A ) SPHINCTEROTOMY REMOVAL OF STONES  Patient Location: PACU  Anesthesia Type:General  Level of Consciousness: awake, alert , oriented and patient cooperative  Airway & Oxygen Therapy: Patient Spontanous Breathing and Patient connected to nasal cannula oxygen  Post-op Assessment: Report given to RN and Post -op Vital signs reviewed and stable  Post vital signs: Reviewed and stable  Last Vitals:  Vitals Value Taken Time  BP 138/81 01/19/2018 12:40 PM  Temp    Pulse 69 01/19/2018 12:40 PM  Resp 20 01/19/2018 12:40 PM  SpO2 94 % 01/19/2018 12:40 PM  Vitals shown include unvalidated device data.  Last Pain:  Vitals:   01/19/18 1036  TempSrc: Oral  PainSc: 0-No pain      Patients Stated Pain Goal: 2 (01/16/18 0856)  Complications: No apparent anesthesia complications

## 2018-01-19 NOTE — Progress Notes (Signed)
Patient consent form is signed. Patient is off the unit for procedure in endo.

## 2018-01-19 NOTE — Op Note (Signed)
[facility] Patient Name: Kenneth Fox Procedure Date : 01/19/2018 MRN: [id number] Attending MD: Justice Britain , MD Date of Birth: 1971-05-05 CSN: [id number] Age: 47 Admit Type: Inpatient Procedure:                ERCP Indications:              Abdominal pain in the right upper quadrant,                            Abnormal hepatobiliary scintigraphy, Evaluation and                            possible treatment of bile duct stone(s), Suspected                            bile duct stone(s), Jaundice, Abnormal liver                            function test Providers:                Justice Britain, MD, Cleda Daub, RN, Cherylynn Ridges, Technician, Lavona Mound, CRNA Referring MD:             Rolm Bookbinder, Chiquita Loth, Dr. Laural Golden, Azucena Freed PA, PA Medicines:                General Anesthesia, Indomethacin 407 mg PR Complications:            No immediate complications. Estimated blood loss:                            Minimal. Estimated Blood Loss:     Estimated blood loss was minimal. Procedure:                Pre-Anesthesia Assessment:                           - Prior to the procedure, a History and Physical                            was performed, and patient medications and                            allergies were reviewed. The patient's tolerance of                            previous anesthesia was also reviewed. The risks                            and benefits of the procedure and the sedation                            options  and risks were discussed with the patient.                            All questions were answered, and informed consent                            was obtained. Prior Anticoagulants: The patient has                            taken Lovenox (enoxaparin), last dose was 1 day                            prior to procedure. ASA Grade Assessment: IV - A    patient with severe systemic disease that is a                            constant threat to life. After reviewing the risks                            and benefits, the patient was deemed in                            satisfactory condition to undergo the procedure.                           After obtaining informed consent, the scope was                            passed under direct vision. Throughout the                            procedure, the patient's blood pressure, pulse, and                            oxygen saturations were monitored continuously. The                            [id number]-[id number] ([id number]) Olympus ERCP was introduced                            through the mouth, and used to inject contrast into                            and used to inject contrast into the bile duct. The                            ERCP was accomplished without difficulty. The                            patient tolerated the procedure. Unfortuantely due                            to technical difficulty, all fluoroscopic images  were not able to be saved (including initial                            cannulation and injection). Scope In: Scope Out: Findings:      The scout film was normal.      The esophagus was successfully intubated under direct vision without       detailed examination of the pharynx, larynx, and associated structures,       and upper GI tract. The major papilla was normal.      A short 0.035 inch Soft Jagwire was passed into the biliary tree. The       short-nosed traction sphincterotome was passed over the guidewire and       the bile duct was then deeply cannulated. Contrast was injected. I       personally interpreted the bile duct images. Ductal flow of contrast was       adequate. Image quality was adequate. Contrast extended to the hepatic       ducts. Opacification of the entire biliary tree except for the cystic       duct and gallbladder was  successful. The maximum diameter of the ducts       was 9 mm. The lower third of the main bile duct and middle third of the       main bile duct contained filling defects thought to be stones. An 8 mm       biliary sphincterotomy was made with a monofilament short nose       sphincterotome using ERBE electrocautery. There was self limited oozing       from the sphincterotomy which did not require treatment. The biliary       tree was swept with a retrieval balloon starting at the bifurcation.       Sludge was swept from the duct. A few stones (at least 7) were removed.       No stones remained. Pus was swept from the duct. Occlusion cholangiogram       was performed and showed no further filling defects in the bile duct.       However, the cystic duct, even under balloon occlusion was not able to       be opacified.      Pancreatogram was not performed.      The endoscope was withdrawn from the patient. Impression:               - The major papilla appeared normal.                           - A filling defect consistent with a stone was seen                            on the cholangiogram.                           - Choledocholithiasis and Cholangitis was found.                            Complete removal of the biliary ductal stones was  accomplished by biliary sphincterotomy and balloon                            sweep.                           - Cholangitis was also found. Recommendation:           - The patient will be observed post-procedure,                            until all discharge criteria are met.                           - Return patient to hospital ward for ongoing care.                           - Observe patient's clinical course.                           - Check liver enzymes (AST, ALT, alkaline                            phosphatase, bilirubin) in the morning.                           - Watch for pancreatitis, bleeding, perforation,                             and cholangitis.                           - Recommend antibiotics for at least a 5-7 day                            course from today or dependent on further studies                            and procedures.                           - Proceed with planned intervention to gallbladder,                            ie percutaneous cholecystostomy tube placement as                            per surgical colleagues recommendations and                            eventual cholecystectomy in time.                           - Hold DVT/VTE PPx for 48-72 hours to decrease risk  of post-sphincterotomy bleeding.                           - The findings and recommendations were discussed                            with the patient.                           - The findings and recommendations were discussed                            with the referring physician. Procedure Code(s):        --- Professional ---                           [phone], Endoscopic retrograde                            cholangiopancreatography (ERCP); with removal of                            calculi/debris from biliary/pancreatic duct(s)                           43262, Endoscopic retrograde                            cholangiopancreatography (ERCP); with                            sphincterotomy/papillotomy Diagnosis Code(s):        --- Professional ---                           K80.50, Calculus of bile duct without cholangitis                            or cholecystitis without obstruction                           R10.11, Right upper quadrant pain                           R94.5, Abnormal results of liver function studies                           R17, Unspecified jaundice                           R93.2, Abnormal findings on diagnostic imaging of                            liver and biliary tract CPT copyright 2017 American Medical Association. All rights reserved. The codes  documented in this report are preliminary and upon coder review may  be revised to meet current compliance requirements. Justice Britain, MD 01/19/2018 12:36:09 PM Number of Addenda: 0

## 2018-01-19 NOTE — Anesthesia Procedure Notes (Signed)
Procedure Name: Intubation Date/Time: 01/19/2018 11:26 AM Performed by: White, Amedeo Plenty, CRNA Pre-anesthesia Checklist: Patient identified, Emergency Drugs available, Suction available and Patient being monitored Patient Re-evaluated:Patient Re-evaluated prior to induction Oxygen Delivery Method: Circle System Utilized Preoxygenation: Pre-oxygenation with 100% oxygen Induction Type: IV induction and Rapid sequence Ventilation: Mask ventilation without difficulty Laryngoscope Size: Mac and 4 Grade View: Grade II Tube type: Oral Tube size: 7.5 mm Number of attempts: 1 Airway Equipment and Method: Stylet and Oral airway Placement Confirmation: ETT inserted through vocal cords under direct vision,  positive ETCO2 and breath sounds checked- equal and bilateral Secured at: 24 cm Tube secured with: Tape Dental Injury: Teeth and Oropharynx as per pre-operative assessment

## 2018-01-19 NOTE — Progress Notes (Signed)
Spoke Dr. Meridee ScoreMansouraty and Vernona RiegerLaura, Rn patient did well in surgery, had some pus and stones removed from bile duct. Patient was extubated with out any issues. Patient is on the way back to the unit.

## 2018-01-19 NOTE — Progress Notes (Addendum)
Subjective: [name] reported he had some nausea and vomiting last night after his HIDA scan. He said his abdominal pain was the same today but denied any nausea or vomiting this morning. He felt cold and endorsed having some chills and cold sweats last night. He denied chest pain or SOB. He expressed that he was nervous about the procedures and intubation under general anesthesia. It was explained to him that all the necessary tests prior to any procedure had been completed to help anesthesia with intubation/extubation. He understood the need for the procedure(s).  Objective:  Vital signs in last 24 hours: Vitals:   01/19/18 1247 01/19/18 1252 01/19/18 1302 01/19/18 1334  BP: (!) 161/89 (!) 168/100 (!) 158/83 (!) 175/99  Pulse: 70 76 76   Resp: 16 13 15    Temp:   97.6 F (36.4 C)   TempSrc:      SpO2: 97% 95% 97%   Weight:      Height:       Physical Exam  Constitutional: He is oriented to person, place, and time and well-developed, well-nourished, and in no distress.  Cardiovascular: Normal rate, regular rhythm and normal heart sounds.  No murmur heard. Pulmonary/Chest: Effort normal and breath sounds normal. No respiratory distress.  Abdominal: Soft. Bowel sounds are normal. He exhibits no distension. There is no tenderness. There is no guarding.  Musculoskeletal: He exhibits edema.  Neurological: He is alert and oriented to person, place, and time.    Assessment/Plan:  Principal Problem:   Acute cholecystitis Active Problems:   Essential hypertension   OSA on CPAP   Diabetes mellitus type 2, insulin dependent (HCC)   Oxygen dependent   Elevated liver function tests  Kenneth Fox is a 47 year old male with a PMHx of diastolic CHF, obstructive sleep apnea, HTN, Type II DM, gastroparesis and morbid obesity presenting with abdominal pain, elevated LFT's and imaging suggestive of acute cholecystitis. Increased bilirubin and HIDA scan concerning for choledocholithiasis. ERCP  today with possible percutaneous cholecystostomy tube placement tomorrow.   Acute cholecystitis with possible choledocholithiasis:  HIDA scan showed nonvisualization of gallbladder after 2 hours, concern for obstruction of both cystic and common bile ducts. Patient scheduled for an ERCP under general anesthesia today. Discussed the case with GI, Dr. Meridee ScoreMansouraty- choledocholithiasis without cholangitis was found. He was able to remove six small stones from the biliary tree, the cystic duct was not able to be opacified. Pus was swept from the duct and continuous bile flow was seen. GI recommended proceeding with planned intervention to gallbladder, percutaneous cholecystostomy tube placement and eventual cholecystectomy. Spoke with interventional radiology Dr. Lowella DandyHenn regarding percutaneous cholecystostomy tube placement-unclear whether he is a candidate for this given no gallbladder distention seen on imaging. He will review ERCP results and examine the patient to determine if drain is necessary. Will observe patient's clinical course and check liver enzymes in the am.  Post-ercp: total bilirubin decreased from 4.7 to 2.9. AST decreased from 219 to 127, ALT decreased from 325 to 276.   -check am LFTs  -antibiotics 5-7 days per GI  -watch for ERCP complications (pancreatitis, bleeding, perforation and cholangitis)  -f/u with IR for perc chole tube   -NPO after midnight -hold Lovenox for 48-72 hours per GI   HTN: 181/110. Patient did not receive metoprolol last night due to Fox/V. Recommend eventually weaning patient off of clonidine due to low HR's on both metoprolol and clonidine therapy.  -continue clonidine 0.2 mg oral BID -continue hydralazine50mg  TID -  continue metoprolol 25 mg BID  Diastolic HF:  -see HTN therapy above   CKD: Cr 2.93, today, baseline 2.8.  -f/u cmp -continue NS 125 mL/hr  OSA and obesity hypoventilation syndrome Chronic hypoxic respiratory failure: Stable on 3L [place]. No  signs of volume overload or SOB. Bilateral LE edema, at baseline. Pulmonary reviewed PFTs and stated it was suggestive of restrictive lung diease and not obstructive as reported before. Recommended optimizing CHF treatment, CPAP at night, and to minimize narcotic use post-op. Moderate risk for surgery from pulmonary standpoint. -continue 3L [place] -f/u pulmonary -CPAP qhs  Type II DM: CBG 141.  -Levemir 10 units, novolog 10 units with SSI -Lyrica 100 mg TID PRN  -Reglan 10 mg IV x1 -continue lyrica 100 mg TID    Dispo: Anticipated discharge in approximately 1-2 day(s).   Kenneth Cale N, DO 01/19/2018, 2:10 PM Pager: [phone]

## 2018-01-19 NOTE — Anesthesia Preprocedure Evaluation (Addendum)
Anesthesia Evaluation  Patient identified by MRN, date of birth, ID band Patient awake    Reviewed: Allergy & Precautions, NPO status , Patient's Chart, lab work & pertinent test results  Airway Mallampati: III  TM Distance: >3 FB Neck ROM: Full    Dental  (+) Poor Dentition, Loose, Chipped,    Pulmonary asthma , sleep apnea , COPD,     + wheezing      Cardiovascular hypertension, Pt. on home beta blockers +CHF   Rhythm:Regular Rate:Normal     Neuro/Psych negative neurological ROS  negative psych ROS   GI/Hepatic Neg liver ROS, GERD  ,  Endo/Other  diabetes, Type 2, Insulin Dependent  Renal/GU   negative genitourinary   Musculoskeletal negative musculoskeletal ROS (+)   Abdominal (+) + obese,   Peds  Hematology  (+) Sickle cell trait ,   Anesthesia Other Findings   Reproductive/Obstetrics                          Lab Results  Component Value Date   WBC 6.9 01/17/2018   HGB 12.4 (L) 01/17/2018   HCT 39.6 01/17/2018   MCV 82.8 01/17/2018   PLT 205 01/17/2018   Lab Results  Component Value Date   CREATININE 2.93 (H) 01/19/2018   BUN 18 01/19/2018   NA 141 01/19/2018   K 3.6 01/19/2018   CL 105 01/19/2018   CO2 27 01/19/2018   Lab Results  Component Value Date   INR 1.07 06/01/2015   INR 1.00 09/06/2013   INR 0.90 06/30/2010   Echo: - Left ventricle: The cavity size was normal. Wall thickness was   increased in a pattern of mild LVH. Systolic function was normal.   The estimated ejection fraction was in the range of 55% to 60%.   Wall motion was normal; there were no regional wall motion   abnormalities.  EKG: normal sinus rhythm.   Anesthesia Physical Anesthesia Plan  ASA: III  Anesthesia Plan: General   Post-op Pain Management:    Induction: Intravenous  PONV Risk Score and Plan: 3 and Ondansetron, Midazolam and Treatment may vary due to age or medical  condition  Airway Management Planned: Oral ETT  Additional Equipment: None  Intra-op Plan:   Post-operative Plan: Possible Post-op intubation/ventilation  Informed Consent: I have reviewed the patients History and Physical, chart, labs and discussed the procedure including the risks, benefits and alternatives for the proposed anesthesia with the patient or authorized representative who has indicated his/her understanding and acceptance.   Dental advisory given  Plan Discussed with: CRNA  Anesthesia Plan Comments: (Wheezing mildly improved after breathing treatment in Endo preop. )      Anesthesia Quick Evaluation

## 2018-01-19 NOTE — Progress Notes (Signed)
Internal Medicine Attending  Date: 01/19/2018  Patient name: Kenneth Fox Medical record number: [id number] Date of birth: 01/10/1971 Age: 47 y.o. Gender: male  I saw and evaluated the patient. I reviewed the resident's note by Dr. Karilyn Cotaehman and I agree with the resident's findings and plans as documented in her progress note.  The patient underwent an ERCP this afternoon and choledocholithiasis and cholangitis was discovered. Successful removal of the stones, sludge, and pus was achieved, although the cystic duct could not be visualized with the dye. The recommendation is to continue the antibiotics, continue monitoring closely for complications related to the ERCP, and proceed to cholecystotomy and eventual cholecystectomy should these be indicated. We are following these recoemmendations and interventional radiology will reassess the need for cholecystotomy in the morning. We are very grateful for the work of GI in addressing the choledocholithiasis and cholangitis.

## 2018-01-20 ENCOUNTER — Inpatient Hospital Stay (HOSPITAL_COMMUNITY): Payer: Medicare Other

## 2018-01-20 DIAGNOSIS — K8063 Calculus of gallbladder and bile duct with acute cholecystitis with obstruction: Secondary | ICD-10-CM

## 2018-01-20 LAB — COMPREHENSIVE METABOLIC PANEL
ALBUMIN: 2 g/dL — AB (ref 3.5–5.0)
ALK PHOS: 290 U/L — AB (ref 38–126)
ALT: 195 U/L — ABNORMAL HIGH (ref 0–44)
ANION GAP: 8 (ref 5–15)
AST: 55 U/L — ABNORMAL HIGH (ref 15–41)
BUN: 41 mg/dL — ABNORMAL HIGH (ref 6–20)
CO2: 28 mmol/L (ref 22–32)
Calcium: 8.2 mg/dL — ABNORMAL LOW (ref 8.9–10.3)
Chloride: 103 mmol/L (ref 98–111)
Creatinine, Ser: 3.53 mg/dL — ABNORMAL HIGH (ref 0.61–1.24)
GFR calc Af Amer: 22 mL/min — ABNORMAL LOW (ref >60)
GFR calc non Af Amer: 19 mL/min — ABNORMAL LOW (ref >60)
GLUCOSE: 298 mg/dL — AB (ref 70–99)
POTASSIUM: 4.2 mmol/L (ref 3.5–5.1)
Sodium: 139 mmol/L (ref 135–145)
Total Bilirubin: 1.9 mg/dL — ABNORMAL HIGH (ref 0.3–1.2)
Total Protein: 5.4 g/dL — ABNORMAL LOW (ref 6.5–8.1)

## 2018-01-20 LAB — PROTIME-INR
INR: 0.96
Prothrombin Time: 12.7 seconds (ref 11.4–15.2)

## 2018-01-20 LAB — GLUCOSE, CAPILLARY
GLUCOSE-CAPILLARY: 193 mg/dL — AB (ref 70–99)
GLUCOSE-CAPILLARY: 237 mg/dL — AB (ref 70–99)
GLUCOSE-CAPILLARY: 306 mg/dL — AB (ref 70–99)

## 2018-01-20 LAB — CULTURE, BLOOD (ROUTINE X 2)
CULTURE: NO GROWTH
Culture: NO GROWTH
SPECIAL REQUESTS: ADEQUATE

## 2018-01-20 MED ORDER — LIDOCAINE HCL 1 % IJ SOLN
INTRAMUSCULAR | Status: AC
  Filled 2018-01-20: qty 20

## 2018-01-20 MED ORDER — FENTANYL CITRATE (PF) 100 MCG/2ML IJ SOLN
INTRAMUSCULAR | Status: AC | PRN
  Administered 2018-01-20 (×4): 50 ug via INTRAVENOUS

## 2018-01-20 MED ORDER — MIDAZOLAM HCL 2 MG/2ML IJ SOLN
INTRAMUSCULAR | Status: AC | PRN
  Administered 2018-01-20 (×3): 1 mg via INTRAVENOUS

## 2018-01-20 MED ORDER — HYDRALAZINE HCL 50 MG PO TABS
75.0000 mg | ORAL_TABLET | Freq: Three times a day (TID) | ORAL | Status: DC
  Administered 2018-01-20 – 2018-01-23 (×10): 75 mg via ORAL
  Filled 2018-01-20 (×10): qty 1

## 2018-01-20 MED ORDER — SODIUM CHLORIDE 0.9 % IV SOLN
INTRAVENOUS | Status: DC
  Administered 2018-01-20: 07:00:00 via INTRAVENOUS

## 2018-01-20 MED ORDER — FENTANYL CITRATE (PF) 100 MCG/2ML IJ SOLN
INTRAMUSCULAR | Status: AC
  Filled 2018-01-20: qty 4

## 2018-01-20 MED ORDER — MIDAZOLAM HCL 2 MG/2ML IJ SOLN
INTRAMUSCULAR | Status: AC
  Filled 2018-01-20: qty 6

## 2018-01-20 NOTE — Progress Notes (Signed)
Daily Rounding Note  01/20/2018, 2:33 PM  LOS: 5 days   SUBJECTIVE:     Pt not seen, down in IR for perc drain placement.    OBJECTIVE:         Vital signs in last 24 hours:    Temp:  [97.5 F (36.4 C)-98.4 F (36.9 C)] 97.6 F (36.4 C) (08/15 1200) Pulse Rate:  [53-75] 69 (08/15 0830) BP: (110-160)/(60-122) 132/101 (08/15 1200) SpO2:  [92 %-99 %] 96 % (08/15 1200) Weight:  [152.2 kg] 152.2 kg (08/15 0645) Last BM Date: 01/19/18 Filed Weights   01/14/18 2336 01/20/18 0645  Weight: (!) 148.3 kg (!) 152.2 kg   Not seen  Intake/Output from previous day: 08/14 0701 - 08/15 0700 In: 894.3 [P.O.:328; I.V.:566.3] Out: 5 [Blood:5]  Intake/Output this shift: Total I/O In: 750 [I.V.:750] Out: -   Lab Results: No results for input(s): WBC, HGB, HCT, PLT in the last 72 hours. BMET Recent Labs    01/18/18 0251 01/19/18 0338 01/20/18 0333  NA 142 141 139  K 3.5 3.6 4.2  CL 102 105 103  CO2 31 27 28   GLUCOSE 174* 135* 298*  BUN 21* 18 41*  CREATININE 3.25* 2.93* 3.53*  CALCIUM 8.5* 8.5* 8.2*   LFT Recent Labs    01/18/18 0832 01/19/18 0338 01/20/18 0333  PROT 6.3* 5.8* 5.4*  ALBUMIN 2.2* 2.2* 2.0*  AST 219* 127* 55*  ALT 325* 276* 195*  ALKPHOS 341* 337* 290*  BILITOT 4.7* 2.9* 1.9*  BILIDIR 3.1*  --   --   IBILI 1.6*  --   --    PT/INR Recent Labs    01/20/18 0333  LABPROT 12.7  INR 0.96   Hepatitis Panel No results for input(s): HEPBSAG, HCVAB, HEPAIGM, HEPBIGM in the last 72 hours.  Studies/Results: Nm Hepatobiliary Liver Func  Result Date: 01/18/2018 CLINICAL DATA:  Abdominal pain with nausea and vomiting EXAM: NUCLEAR MEDICINE HEPATOBILIARY IMAGING TECHNIQUE: Sequential images of the abdomen were obtained out to 120 minutes following intravenous administration of radiopharmaceutical. RADIOPHARMACEUTICALS:  5.4 mCi Tc-4054m  Choletec IV COMPARISON:  CT abdomen and pelvis January 15, 2018;  ultrasound right upper quadrant January 15, 2018 FINDINGS: Liver uptake of radiotracer is unremarkable. After 2 hours, neither gallbladder nor small bowel visualizes. IMPRESSION: Nonvisualization of gallbladder and small bowel after 2 hours. While the patient's bilirubin is elevated, this degree of bilirubin elevation is not felt to be sufficient to cause nonvisualization of small bowel. This overall appearance raises concern for obstruction of both cystic and common bile ducts. From an imaging standpoint, abdominal MR/MRCP would be optimal for further assessment in this regard, in particular to assess for potential mass or calculus causing this these sonographic abnormalities. Cholangitis is a differential consideration. Electronically Signed   By: Bretta BangWilliam  Woodruff III M.D.   On: 01/18/2018 17:13   Dg Ercp Biliary & Pancreatic Ducts  Result Date: 01/19/2018 CLINICAL DATA:  ERCP Bile duct stone Dr. Caleb PoppMansouraty [facility] 2 minutes 58 seconds fluoro time 3 images sent to PACS EXAM: ERCP TECHNIQUE: Multiple spot images obtained with the fluoroscopic device and submitted for interpretation post-procedure. COMPARISON:  Scintigraphy 01/18/2018 and prior studies FINDINGS: 3 intraoperative fluoroscopic spot images document endoscopic cannulation and opacification of the CBD with passage of a balloon tipped catheter. The intrahepatic ducts appear nondilated. No extravasation is identified. IMPRESSION: Endoscopic CBD cannulation and intervention These images were submitted for radiologic interpretation only. Please see the  procedural report for the amount of contrast and the fluoroscopy time utilized. Electronically Signed   By: Corlis Leak  Hassell M.D.   On: 01/19/2018 13:26   [place] Abdomen Limited Ruq  Result Date: 01/20/2018 CLINICAL DATA:  Cholangitis, choledocholithiasis and probable chronic cholecystitis. Evaluation for possible cholecystostomy tube placement. EXAM: ULTRASOUND ABDOMEN LIMITED RIGHT UPPER QUADRANT  COMPARISON:  Right upper quadrant ultrasound and abdominal CT on 01/15/2018 FINDINGS: Gallbladder: The gallbladder is difficult to completely visualize by ultrasound due to body habitus. There is continued suggestion gallbladder wall thickening and edema with wall thickness of approximately 4 mm. No focal tenderness overlying the gallbladder. The lumen is incompletely evaluated for assessment underlying gallstones. Common bile duct: Diameter: Mild dilatation measuring 9 mm in maximum diameter. No obvious filling defects in the visualized common bile duct which is only visible near the porta hepatis. Liver: No evidence of intrahepatic biliary ductal dilatation or focal liver lesion. Portal vein is patent on color Doppler imaging with normal direction of blood flow towards the liver. IMPRESSION: Incomplete visualization of the gallbladder lumen by ultrasound. Continue suggestion of gallbladder wall thickening measuring 4 mm. Dilatation of the common bile duct measuring 9 mm in proximal diameter. Electronically Signed   By: Irish LackGlenn  Yamagata M.D.   On: 01/20/2018 10:18   Scheduled Meds: . cloNIDine  0.2 mg Oral BID  . fentaNYL      . hydrALAZINE  50 mg Oral TID  . indomethacin  100 mg Rectal Once  . insulin aspart  0-15 Units Subcutaneous TID WC  . insulin detemir  10 Units Subcutaneous QHS  . lidocaine      . metoprolol tartrate  25 mg Oral BID  . midazolam      . sodium chloride flush  3 mL Intravenous Q12H   Continuous Infusions: . sodium chloride    . piperacillin-tazobactam (ZOSYN)  IV 3.375 g (01/20/18 1214)   PRN Meds:.sodium chloride, acetaminophen **OR** acetaminophen, fentaNYL, HYDROmorphone (DILAUDID) injection, metoprolol tartrate, ondansetron **OR** ondansetron (ZOFRAN) IV, polyethylene glycol, pregabalin, promethazine **OR** promethazine **OR** promethazine, senna-docusate   ASSESMENT:   *   Choledocholithiasis, cholangitis 8/14 ERCP with sphinct, stone removal.  Pus seen along with  a few stones at bile duct sweep.  On Zosyn.   WBCs, LFTs improved.    *  Suspected acute cholecystitis.  Undergoing perc cholecystostomy tube now.  Surgeon plans lap chole in 6 weeks  *   AKI.     PLAN   *  Per surgeons.  GI available PRN.      Jennye MoccasinSarah Emil Weigold  01/20/2018, 2:33 PM Phone [phone]

## 2018-01-20 NOTE — Sedation Documentation (Signed)
Patient is resting comfortably. 

## 2018-01-20 NOTE — Progress Notes (Signed)
Internal Medicine Attending  Date: 01/20/2018  Patient name: Marya Landryhomas E Eiland Medical record number: [id number] Date of birth: July 16, 1970 Age: [age] y.o. Gender: male  I saw and evaluated the patient. I reviewed the resident's note by Dr. Karilyn Cotaehman and I agree with the resident's findings and plans as documented in her progress note.  When seen on rounds this morning Mr. Buffa objectively felt somewhat better. He denied any nausea or vomiting and his abdominal pain was improved. His big concern today had to do with the placement of the cholecystotomy tube and with the edema in his legs.  The percutaneous CT-guided approach was difficult, but upon entering the gallbladder there was apparently very little bile as the gallbladder was full of stones. The cholecystotomy tube could not be placed. I'm unsure what the next step is at this juncture and will rely on GI and general surgery to provide us with some guidance as to where to go from here. In the meantime, we will increase the hydralazine to 75 mg by mouth 3 times daily in order to achieve better blood pressure control and continue the antibiotics s/p ERCP.

## 2018-01-20 NOTE — Sedation Documentation (Signed)
Bandaid mid abdomen  intact

## 2018-01-20 NOTE — Procedures (Signed)
Interventional Radiology Procedure Note  Procedure: Aspiration of bile from gallbladder; attempted cholecystostomy tube placement  Complications: None  Estimated Blood Loss: < 10 mL  Findings: Difficult approach to gallbladder under CT guidance due to body habitus, close proximity of hepatic flexure of colon and lack of significant gallbladder distention.  GB very poorly visualized by US today, necessitating CT guidance for procedure.  18 G needle able to be advanced into GB yielding small sample of dark bile; sent for culture.  Gritty calculi felt during needle puncture.  Due to multiple calculi and lack of significant GB distention, wires consistently buckled outside lumen of GB in peritoneal fat and a cholecystostomy tube was unable to be advanced into the GB lumen.  Wire also barely entered the GB because lumen is packed with stones and contains little bile.  Patient is not a candidate for future attempt at percutaneous cholecystostomy unless he clearly develops gallbladder distention in future by imaging.  [name], M.D Pager:  [phone]

## 2018-01-20 NOTE — Progress Notes (Signed)
1 Day Post-Op   Subjective/Chief Complaint: S/p ercp yesterday, no real complaints today   Objective: Vital signs in last 24 hours: Temp:  [97.3 F (36.3 C)-98.4 F (36.9 C)] 97.9 F (36.6 C) (08/15 0830) Pulse Rate:  [53-77] 69 (08/15 0830) Resp:  [13-18] 15 (08/14 1302) BP: (110-205)/(60-122) 110/90 (08/15 0830) SpO2:  [92 %-99 %] 97 % (08/15 0830) Weight:  [152.2 kg] 152.2 kg (08/15 0645) Last BM Date: 01/19/18  Intake/Output from previous day: 08/14 0701 - 08/15 0700 In: 894.3 [P.O.:328; I.V.:566.3] Out: 5 [Blood:5] Intake/Output this shift: No intake/output data recorded.  GI: soft nontender  Lab Results:  No results for input(s): WBC, HGB, HCT, PLT in the last 72 hours. BMET Recent Labs    01/19/18 0338 01/20/18 0333  NA 141 139  K 3.6 4.2  CL 105 103  CO2 27 28  GLUCOSE 135* 298*  BUN 18 41*  CREATININE 2.93* 3.53*  CALCIUM 8.5* 8.2*   PT/INR Recent Labs    01/20/18 0333  LABPROT 12.7  INR 0.96   ABG No results for input(s): PHART, HCO3 in the last 72 hours.  Invalid input(s): PCO2, PO2  Studies/Results: Nm Hepatobiliary Liver Func  Result Date: 01/18/2018 CLINICAL DATA:  Abdominal pain with nausea and vomiting EXAM: NUCLEAR MEDICINE HEPATOBILIARY IMAGING TECHNIQUE: Sequential images of the abdomen were obtained out to 120 minutes following intravenous administration of radiopharmaceutical. RADIOPHARMACEUTICALS:  5.4 mCi Tc-5743m  Choletec IV COMPARISON:  CT abdomen and pelvis January 15, 2018; ultrasound right upper quadrant January 15, 2018 FINDINGS: Liver uptake of radiotracer is unremarkable. After 2 hours, neither gallbladder nor small bowel visualizes. IMPRESSION: Nonvisualization of gallbladder and small bowel after 2 hours. While the patient's bilirubin is elevated, this degree of bilirubin elevation is not felt to be sufficient to cause nonvisualization of small bowel. This overall appearance raises concern for obstruction of both cystic and  common bile ducts. From an imaging standpoint, abdominal MR/MRCP would be optimal for further assessment in this regard, in particular to assess for potential mass or calculus causing this these sonographic abnormalities. Cholangitis is a differential consideration. Electronically Signed   By: Bretta BangWilliam  Woodruff III M.D.   On: 01/18/2018 17:13   Dg Ercp Biliary & Pancreatic Ducts  Result Date: 01/19/2018 CLINICAL DATA:  ERCP Bile duct stone Dr. Caleb PoppMansouraty [place] [facility] 2 minutes 58 seconds fluoro time 3 images sent to PACS EXAM: ERCP TECHNIQUE: Multiple spot images obtained with the fluoroscopic device and submitted for interpretation post-procedure. COMPARISON:  Scintigraphy 01/18/2018 and prior studies FINDINGS: 3 intraoperative fluoroscopic spot images document endoscopic cannulation and opacification of the CBD with passage of a balloon tipped catheter. The intrahepatic ducts appear nondilated. No extravasation is identified. IMPRESSION: Endoscopic CBD cannulation and intervention These images were submitted for radiologic interpretation only. Please see the procedural report for the amount of contrast and the fluoroscopy time utilized. Electronically Signed   By: Corlis Leak  Hassell M.D.   On: 01/19/2018 13:26    Anti-infectives: Anti-infectives (From admission, onward)   Start     Dose/Rate Route Frequency Ordered Stop   01/15/18 1200  piperacillin-tazobactam (ZOSYN) IVPB 3.375 g     3.375 g 12.5 mL/hr over 240 Minutes Intravenous Every 8 hours 01/15/18 0833     01/15/18 0530  piperacillin-tazobactam (ZOSYN) IVPB 3.375 g     3.375 g 100 mL/hr over 30 Minutes Intravenous  Once 01/15/18 0517 01/15/18 0720      Assessment/Plan: Likely chronic cholecystitis, possible choledocholithiasis - GN:FAOZCT:Mild soft tissue  inflammation about the base of the gallbladder, with adjacent nodes measuring up to 1.3 cm in short axis, raising concern for acute cholecystitis - RUQ ZO:XWRUEAS:Stones noted filling the gallbladder.  Somewhat thickened gallbladder wall, without definite evidence of cholecystitis -s/p ercp yesterday, for perc chole today, plan lap chole in 6 weeks FEN:can eat after perc chole VWU:[id number]:SCDs, ok to give lovenox from surgery standpoint BJ:YNWGN:Zosyn 8/10>>  LOS: 5 days    Kenneth Fox 01/20/2018

## 2018-01-20 NOTE — Sedation Documentation (Signed)
Pt came down on 02 3L and will return on 3L

## 2018-01-20 NOTE — Anesthesia Postprocedure Evaluation (Signed)
Anesthesia Post Note  Patient: Kenneth Fox  Procedure(s) Performed: ENDOSCOPIC RETROGRADE CHOLANGIOPANCREATOGRAPHY (ERCP) WITH PROPOFOL (N/A ) SPHINCTEROTOMY REMOVAL OF STONES     Patient location during evaluation: PACU Anesthesia Type: General Level of consciousness: awake and alert Pain management: pain level controlled Vital Signs Assessment: post-procedure vital signs reviewed and stable Respiratory status: spontaneous breathing, nonlabored ventilation, respiratory function stable and patient connected to nasal cannula oxygen Cardiovascular status: blood pressure returned to baseline and stable Postop Assessment: no apparent nausea or vomiting Anesthetic complications: no    Last Vitals:  Vitals:   01/20/18 0524 01/20/18 0830  BP: (!) 160/96 110/90  Pulse: 63 69  Resp:    Temp:  36.6 C  SpO2: 99% 97%               Shelton SilvasKevin D Clotile Whittington

## 2018-01-20 NOTE — Progress Notes (Signed)
Inpatient Diabetes Program Recommendations  AACE/ADA: New Consensus Statement on Inpatient Glycemic Control (2015)  Target Ranges:  Prepandial:   less than 140 mg/dL      Peak postprandial:   less than 180 mg/dL (1-2 hours)      Critically ill patients:  140 - 180 mg/dL   Lab Results  Component Value Date   GLUCAP 237 (H) 01/20/2018   HGBA1C 10.0 (H) 03/04/2017    Review of Glycemic ControlResults for Kenneth RathkeFISCHER, Everet E (MRN [id number]) as of 01/20/2018 13:58  Ref. Range 01/19/2018 17:22 01/19/2018 21:35 01/20/2018 00:18 01/20/2018 12:07  Glucose-Capillary Latest Ref Range: 70 - 99 mg/dL 409277 (H) 811342 (H) 914306 (H) 237 (H)   Diabetes history: Type 2 DM  Outpatient Diabetes medications:  Levemir 40 units bid, Novolog 10-20 units tid with meals Current orders for Inpatient glycemic control:  Levemir 10 units q HS, Novolog moderate tid with meals  Inpatient Diabetes Program Recommendations:   Please consider increasing Levemir to 20 units bid- (this is 1/2 of home dose).   Thanks,  Beryl MeagerJenny Makayla Lanter, RN, BC-ADM Inpatient Diabetes Coordinator Pager [phone] (8a-5p)

## 2018-01-20 NOTE — Progress Notes (Signed)
Subjective: Mr. Larmon said he felt somewhat better today and he was not in pain but felt different. He said he did not know how to describe the different feeling but his pain was less than before. He also noticed his leg swelling is worse than yesterday. He denied any SOB, fevers, chills, chest pain, nausea or vomiting.   Objective:  Vital signs in last 24 hours: Vitals:   01/20/18 0645 01/20/18 0830 01/20/18 1155 01/20/18 1200  BP:  110/90 (!) 132/101 (!) 132/101  Pulse:  69    Resp:      Temp:  97.9 F (36.6 C)  97.6 F (36.4 C)  TempSrc:  Oral  Oral  SpO2:  97%  96%  Weight: (!) 152.2 kg     Height:       Physical Exam  Constitutional: He is oriented to person, place, and time and well-developed, well-nourished, and in no distress.  Cardiovascular: Normal rate, regular rhythm, normal heart sounds and intact distal pulses.  No murmur heard. Pulmonary/Chest: Effort normal and breath sounds normal.  RLL crackles  Abdominal: Soft. Bowel sounds are normal. He exhibits no distension. There is no tenderness. There is no guarding.  Musculoskeletal: He exhibits edema.  Right LE pitting edema  Neurological: He is alert and oriented to person, place, and time.    Assessment/Plan:  Principal Problem:   Acute cholecystitis Active Problems:   Essential hypertension   OSA on CPAP   Diabetes mellitus type 2, insulin dependent ([place])   Oxygen dependent   Elevated liver function tests   Choledocholithiasis with acute cholecystitis with obstruction   Cholangitis due to bile duct calculus with obstruction   Obstructive sleep apnea  Mr. Cassatt is a 47 year old male with a PMHx of diastolic CHF, obstructive sleep apnea, HTN, Type II DM, gastroparesis and morbid obesity presenting with abdominal pain, elevated LFT's and imaging concerning for acute cholecystitis.   Acute cholecystitis with choledocholithiasis: Patient had an ercp yesterday with removal of six stones from his bile  duct and cholangitis. LFT's decreased significantly- total bilirubin 1.9. AST 55, ALT 195, Alk phos 290. No s/sx of ercp complications. RUQ US showed no gallbladder distension however showed wall thickening. Repeat RUQ US showed poor visualization of gallbladder lumen. IR plans to proceed with cholecystostomy tube placement under CT guidance today.  -check am LFTs  -antibiotics 5-7 days per GI  -watch for ERCP complications (pancreatitis, bleeding, perforation and cholangitis)  -hold Lovenoxfor 48-72 hours per GI  -f/u surgery outpatient to plan cholecystectomy in ~6 weeks  Diastolic HF HTN: 938/10. Increased bilateral LE pitting edema. Will discontinue IVF's and consider restarting a diuretic.  -continue clonidine 0.2 mg oral BID, hydralazine43m TID, metoprolol 25 mg BID -discontinue NS 125 mL/hr -f/u daily weights -strict intake and output  -consider restarting diuretic   CKD: Cr 3.53, baseline 2.8. Possibly due to NPO and decreased hydration. Discontinuing IVF's due to increasing bilateral LE edema and concern for volume overload; however, will closely follow cmp.  -f/u am cmp  OSA and obesity hypoventilation syndrome Chronic hypoxic respiratory failure: Stable on 3L [place]. Denied SOB.  -continue 3L [place] -CPAP qhs  Type II DM: CBG 237. -Levemir 10 units, novolog 10 units with SSI -Lyrica 100 mg TID PRN  -Reglan 10 mg IV x1 -continue lyrica 100 mg TID   Dispo: Anticipated discharge in approximately 1 day(s). Patient is scheduled for percutaneous cholecystostomy drain placement today. If medically stable he will be discharged tomorrow.  Rehman, [place], DO 01/20/2018, 1:22 PM Pager: [phone]

## 2018-01-20 NOTE — Consult Note (Signed)
Chief Complaint: Patient was seen in consultation today for percutaneous cholecystostomy drain placement Chief Complaint  Patient presents with  . Abdominal Pain  . Nausea   at the request of Dr Darnelle Spangle   Supervising Physician: Irish Lack  Patient Status: [facility] - In-pt  History of Present Illness: Kenneth Fox is a 47 y.o. male   abd pain; N/V Pain now over 1 week US reveals cholecystitis HIDA - nonvisualization ERCP yesterday The major papilla appeared normal. - A filling defect consistent with a stone was seen on the cholangiogram. - Choledocholithiasis and Cholangitis was found. Complete removal of the biliary ductal stones was accomplished by biliary sphincterotomy and balloon sweep. - Cholangitis was also found.  CCS requesting percutaneous cholecystostomy drain placement- Surgery likely in approx 6 weeks  Dr Fredia Sorrow has reviewed all imaging Aprroves procedure  Past Medical History:  Diagnosis Date  . Asthma   . CHF (congestive heart failure) (HCC)   . Diabetes (HCC)   . Gastroparesis   . GERD (gastroesophageal reflux disease)   . Hypertension   . Kidney disorder   . Neuropathy   . Obesity   . Obstructive sleep apnea    will not wear CPAP  . Renal insufficiency   . Sickle cell trait [facility])     Past Surgical History:  Procedure Laterality Date  . HIP FRACTURE SURGERY Right 1999   "put a plate in"    Allergies: Angiotensin receptor blockers and Lisinopril  Medications: Prior to Admission medications   Medication Sig Start Date End Date Taking? Authorizing Provider  acetaminophen (TYLENOL) 500 MG tablet Take 1 tablet (500 mg total) by mouth every 6 (six) hours as needed. Patient taking differently: Take 500 mg by mouth every 6 (six) hours as needed for mild pain or headache.  02/05/17  Yes Law, Waylan Boga, PA-C  hydrALAZINE (APRESOLINE) 25 MG tablet Take 1 tablet (25 mg total) by mouth 3 (three) times daily. 03/07/17  Yes Maxie Barb, MD  insulin aspart (NOVOLOG) 100 UNIT/ML injection Inject 10-20 Units into the skin 3 (three) times daily with meals. per sliding scale CBG 70 - 120: 0 units CBG 121 - 150: 2 units CBG 151 - 200: 3 units CBG 201 - 250: 5 units CBG 251 - 300: 8 units CBG 301 - 350: 11 units CBG 351 - 400: 15 units 06/06/15  Yes Alison Murray, MD  insulin detemir (LEVEMIR) 100 UNIT/ML injection Inject 0.8 mLs (80 Units total) into the skin every evening. Patient taking differently: Inject 40 Units into the skin 2 (two) times daily.  12/13/15  Yes Rai, Ripudeep K, MD  metoCLOPramide (REGLAN) 10 MG tablet Take 0.5 tablets (5 mg total) by mouth every 8 (eight) hours as needed for nausea. 01/08/18  Yes Linwood Dibbles, MD  Oxycodone HCl 10 MG TABS Take 10 mg by mouth every 6 (six) hours as needed for pain.   Yes [provider]  polyethylene glycol (MIRALAX / GLYCOLAX) packet Take 17 g by mouth daily as needed for mild constipation. 01/28/17  Yes Benjiman Core, MD  pregabalin (LYRICA) 75 MG capsule Take 1 capsule (75 mg total) by mouth 2 (two) times daily. 01/13/18  Yes Rehman, Areeg N, DO  cloNIDine (CATAPRES) 0.2 MG tablet Take 0.2 mg by mouth 2 (two) times daily. 01/06/18   [provider]  metoprolol tartrate (LOPRESSOR) 25 MG tablet Take 1 tablet (25 mg total) by mouth 2 (two) times daily. Patient not taking: Reported on  01/15/2018 10/26/16   Dhungel, Theda BelfastNishant, MD     Family History  Problem Relation Age of Onset  . Sickle cell anemia Mother   . Heart attack Mother   . Diabetes Father   . Neuropathy Father   . Hypertension Father   . Aneurysm Father   . Kidney disease Brother   . Kidney disease Sister     Social History   Socioeconomic History  . Marital status: Married    Spouse name: Not on file  . Number of children: 4  . Years of education: Not on file  . Highest education level: Not on file  Occupational History  . Occupation: disabled  Social Needs  . Financial  resource strain: Not on file  . Food insecurity:    Worry: Not on file    Inability: Not on file  . Transportation needs:    Medical: Not on file    Non-medical: Not on file  Tobacco Use  . Smoking status: Never Smoker  . Smokeless tobacco: Never Used  Substance and Sexual Activity  . Alcohol use: No  . Drug use: No  . Sexual activity: Yes    Partners: Female    Comment: married so no birth control  Lifestyle  . Physical activity:    Days per week: Not on file    Minutes per session: Not on file  . Stress: Not on file  Relationships  . Social connections:    Talks on phone: Not on file    Gets together: Not on file    Attends religious service: Not on file    Active member of club or organization: Not on file    Attends meetings of clubs or organizations: Not on file    Relationship status: Not on file  Other Topics Concern  . 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 Then in 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 Out of work for 4 years.     Review of Systems: A 12 point ROS discussed and pertinent positives are indicated in the HPI above.  All other systems are negative.  Review of Systems  Constitutional: Positive for activity change, appetite change and fatigue. Negative for fever.  Respiratory: Negative for cough and stridor.   Cardiovascular: Negative for chest pain.  Gastrointestinal: Positive for abdominal pain and nausea.  Psychiatric/Behavioral: Negative for behavioral problems and confusion.    Vital Signs: BP 110/90 (BP Location: Right Arm)   Pulse 69   Temp 97.9 F (36.6 C) (Oral)   Resp 15   Ht 5\' 11"  (1.803 m)   Wt (!) 335 lb 8.6 oz (152.2 kg)   SpO2 97%   BMI 46.80 kg/m   Physical Exam  Constitutional: He is oriented to person, place, and time.  Cardiovascular: Normal rate and regular rhythm.  Pulmonary/Chest:  Effort normal and breath sounds normal.  Abdominal: Normal appearance and bowel sounds are normal. There is tenderness.  Neurological: He is alert and oriented to person, place, and time.  Skin: Skin is warm and dry.  Psychiatric: He has a normal mood and affect. His behavior is normal.  Vitals reviewed.   Imaging: Ct Abdomen Pelvis Wo Contrast  Result Date: 01/15/2018 CLINICAL DATA:  Acute onset of gastroparesis. Nausea and vomiting. EXAM: CT ABDOMEN AND PELVIS WITHOUT CONTRAST TECHNIQUE: Multidetector CT imaging of  the abdomen and pelvis was performed following the standard protocol without IV contrast. COMPARISON:  CT of the abdomen and pelvis performed 01/08/2018, and renal ultrasound performed 01/10/2018 FINDINGS: Lower chest: Mild bibasilar opacities likely reflect atelectasis. The visualized portions of the mediastinum are unremarkable. Hepatobiliary: There is mild soft tissue inflammation about the base of the gallbladder, with adjacent nodes measuring up to 1.3 cm in short axis, raising concern for acute cholecystitis. The liver is grossly unremarkable in appearance. The common bile duct remains normal in caliber. Pancreas: The pancreas is within normal limits. Spleen: The spleen is unremarkable in appearance. Adrenals/Urinary Tract: The adrenal glands are unremarkable in appearance. Nonspecific perinephric stranding and fluid are noted bilaterally. The kidneys are otherwise unremarkable. There is no evidence of hydronephrosis. No renal or ureteral stones are identified. Stomach/Bowel: The stomach is unremarkable in appearance. The small bowel is within normal limits. The appendix is normal in caliber, without evidence of appendicitis. The colon is unremarkable in appearance. Vascular/Lymphatic: The abdominal aorta is unremarkable in appearance. The inferior vena cava is grossly unremarkable. No retroperitoneal lymphadenopathy is seen. No pelvic sidewall lymphadenopathy is identified.  Reproductive: The bladder is moderately distended and grossly unremarkable. The prostate is borderline normal in size. Other: Mild subcutaneous edema is noted along the anterior abdominal wall. Musculoskeletal: No acute osseous abnormalities are identified. Postoperative change is noted along the posterior right acetabulum and right ilium. The visualized musculature is unremarkable in appearance. IMPRESSION: 1. Mild soft tissue inflammation about the base of the gallbladder, with adjacent nodes measuring up to 1.3 cm in short axis, raising concern for acute cholecystitis. 2. Mild subcutaneous edema along the anterior abdominal wall. 3. Mild bibasilar airspace opacities likely reflect atelectasis. Electronically Signed   By: Roanna Raider M.D.   On: 01/15/2018 04:30   Ct Abdomen Pelvis Wo Contrast  Result Date: 01/08/2018 CLINICAL DATA:  Nausea and vomiting with abdominal pain. EXAM: CT ABDOMEN AND PELVIS WITHOUT CONTRAST TECHNIQUE: Multidetector CT imaging of the abdomen and pelvis was performed following the standard protocol without IV contrast. COMPARISON:  02/04/2017 FINDINGS: Lower chest: Bibasilar collapse/consolidation noted, right greater than left. Hepatobiliary: The liver shows diffusely decreased attenuation suggesting steatosis. There is no evidence for gallstones, gallbladder wall thickening, or pericholecystic fluid. No intrahepatic or extrahepatic biliary dilation. Pancreas: No focal mass lesion. No dilatation of the main duct. No intraparenchymal cyst. No peripancreatic edema. Spleen: No splenomegaly. No focal mass lesion. Adrenals/Urinary Tract: No adrenal nodule or mass. Kidneys unremarkable. No evidence for hydroureter. The urinary bladder appears normal for the degree of distention. Stomach/Bowel: Stomach is nondistended. No gastric wall thickening. No evidence of outlet obstruction. Duodenum is normally positioned as is the ligament of Treitz. No small bowel wall thickening. No small bowel  dilatation. The terminal ileum is normal. The appendix is normal. No gross colonic mass. No colonic wall thickening. No substantial diverticular change. Vascular/Lymphatic: No abdominal aortic aneurysm. No abdominal aortic atherosclerotic calcification. There is no gastrohepatic or hepatoduodenal ligament lymphadenopathy. No intraperitoneal or retroperitoneal lymphadenopathy. No pelvic sidewall lymphadenopathy. Reproductive: The prostate gland and seminal vesicles have normal imaging features. Other: No intraperitoneal free fluid. Musculoskeletal: No worrisome lytic or sclerotic osseous abnormality. Status post ORIF right hemipelvis. Small umbilical hernia contains fat and small bowel without complicating features. IMPRESSION: 1. No acute findings in the abdomen or pelvis. No findings to explain the patient's history of nausea and vomiting with abdominal pain. Electronically Signed   By: Kennith Center M.D.   On: 01/08/2018 22:16  Dg Chest 2 View  Result Date: 01/08/2018 CLINICAL DATA:  Shortness of breath, nausea, vomiting, abdominal pain, history gastroparesis, CHF, diabetes mellitus, hypertension EXAM: CHEST - 2 VIEW COMPARISON:  02/04/2017 FINDINGS: Enlargement of cardiac silhouette. Mediastinal contours and pulmonary vascularity normal. Low lung volumes with bibasilar atelectasis. Upper lungs clear. No pleural effusion or pneumothorax. IMPRESSION: Enlargement of cardiac silhouette. Low lung volumes with bibasilar atelectasis. Electronically Signed   By: Ulyses Southward M.D.   On: 01/08/2018 20:06   Nm Hepatobiliary Liver Func  Result Date: 01/18/2018 CLINICAL DATA:  Abdominal pain with nausea and vomiting EXAM: NUCLEAR MEDICINE HEPATOBILIARY IMAGING TECHNIQUE: Sequential images of the abdomen were obtained out to 120 minutes following intravenous administration of radiopharmaceutical. RADIOPHARMACEUTICALS:  5.4 mCi Tc-62m  Choletec IV COMPARISON:  CT abdomen and pelvis January 15, 2018; ultrasound right upper  quadrant January 15, 2018 FINDINGS: Liver uptake of radiotracer is unremarkable. After 2 hours, neither gallbladder nor small bowel visualizes. IMPRESSION: Nonvisualization of gallbladder and small bowel after 2 hours. While the patient's bilirubin is elevated, this degree of bilirubin elevation is not felt to be sufficient to cause nonvisualization of small bowel. This overall appearance raises concern for obstruction of both cystic and common bile ducts. From an imaging standpoint, abdominal MR/MRCP would be optimal for further assessment in this regard, in particular to assess for potential mass or calculus causing this these sonographic abnormalities. Cholangitis is a differential consideration. Electronically Signed   By: Bretta Bang III M.D.   On: 01/18/2018 17:13   US Renal  Result Date: 01/10/2018 CLINICAL DATA:  Acute renal insufficiency. EXAM: RENAL / URINARY TRACT ULTRASOUND COMPLETE COMPARISON:  None. FINDINGS: Right Kidney: Length: 13 cm. Echogenicity within normal limits. No mass or hydronephrosis visualized. Left Kidney: Length: 12.9 cm. Echogenicity within normal limits. No mass or hydronephrosis visualized. Bladder: Appears normal for degree of bladder distention. IMPRESSION: No cause for acute renal insufficiency identified. Electronically Signed   By: Gerome Sam III M.D   On: 01/10/2018 15:16   Dg Chest Portable 1 View  Result Date: 01/15/2018 CLINICAL DATA:  Dyspnea and hypertension EXAM: PORTABLE CHEST 1 VIEW COMPARISON:  01/10/2018 FINDINGS: Low lung volumes with crowding of interstitial lung markings. Band like areas of opacity project over the lung bases likely representing areas of atelectasis. Foci of pneumonia are believed less likely given the bandlike appearance. Stable cardiomegaly is identified with minimal aortic atherosclerosis. IMPRESSION: Stable cardiomegaly with low lung volumes and bandlike opacities at the lung bases likely representing atelectasis.  Electronically Signed   By: Tollie Eth M.D.   On: 01/15/2018 02:03   Dg Chest Port 1 View  Result Date: 01/10/2018 CLINICAL DATA:  Acute shortness of breath. EXAM: PORTABLE CHEST 1 VIEW COMPARISON:  01/10/2018 and prior radiographs FINDINGS: Cardiomegaly again noted. Mild subsegmental atelectasis/scarring in the RIGHT mid lung noted. Mild pulmonary vascular congestion is present. No definite edema, pleural effusion or pneumothorax noted. IMPRESSION: Cardiomegaly with mild pulmonary vascular congestion. Electronically Signed   By: Harmon Pier M.D.   On: 01/10/2018 18:33   Dg Chest Port 1 View  Result Date: 01/10/2018 CLINICAL DATA:  Vomiting and shortness of breath today. History of CHF, gastroparesis, gastroesophageal reflux, diabetes EXAM: PORTABLE CHEST 1 VIEW COMPARISON:  PA and lateral chest x-ray of January 08, 2018 FINDINGS: The lungs are better inflated today. The basilar atelectasis on the left is less conspicuous. On the right persists. The interstitial markings are mildly increased. The central pulmonary vascularity is engorged and the cardiac  silhouette is enlarged. IMPRESSION: CHF with mild interstitial edema slightly more conspicuous today. Persistent subsegmental atelectasis at the right lung base. Improved atelectasis on the left. Electronically Signed   By: David  [place] M.D.   On: 01/10/2018 09:27   Dg Ercp Biliary & Pancreatic Ducts  Result Date: 01/19/2018 CLINICAL DATA:  ERCP Bile duct stone Dr. Caleb Popp [facility] 2 minutes 58 seconds fluoro time 3 images sent to PACS EXAM: ERCP TECHNIQUE: Multiple spot images obtained with the fluoroscopic device and submitted for interpretation post-procedure. COMPARISON:  Scintigraphy 01/18/2018 and prior studies FINDINGS: 3 intraoperative fluoroscopic spot images document endoscopic cannulation and opacification of the CBD with passage of a balloon tipped catheter. The intrahepatic ducts appear nondilated. No extravasation is identified.  IMPRESSION: Endoscopic CBD cannulation and intervention These images were submitted for radiologic interpretation only. Please see the procedural report for the amount of contrast and the fluoroscopy time utilized. Electronically Signed   By: Corlis Leak M.D.   On: 01/19/2018 13:26   US Abdomen Limited Ruq  Result Date: 01/15/2018 CLINICAL DATA:  Acute onset of upper abdominal pain. EXAM: ULTRASOUND ABDOMEN LIMITED RIGHT UPPER QUADRANT COMPARISON:  CT of the abdomen and pelvis performed earlier today at 4:12 a.m. FINDINGS: Gallbladder: The gallbladder wall is somewhat thickened, measuring 4 mm. There appears to be a wall echo shadow sign, reflecting stones filling the gallbladder. No ultrasonographic Murphy's sign is elicited. No pericholecystic fluid is seen. Common bile duct: Diameter: 0.3 cm, within normal limits in caliber. Liver: No focal lesion identified. Within normal limits in parenchymal echogenicity. Portal vein is patent on color Doppler imaging with normal direction of blood flow towards the liver. IMPRESSION: Stones noted filling the gallbladder. Somewhat thickened gallbladder wall, without definite evidence of cholecystitis. The appearance on CT could reflect cholangitis; would correlate with the patient's symptoms. Electronically Signed   By: Roanna Raider M.D.   On: 01/15/2018 06:02    Labs:  CBC: Recent Labs    01/11/18 0602 01/14/18 2351 01/16/18 0354 01/17/18 0704  WBC 13.8* 8.8 7.0 6.9  HGB 12.9* 12.2* 11.8* 12.4*  HCT 41.1 38.2* 37.9* 39.6  PLT 235 215 208 205    COAGS: Recent Labs    01/20/18 0333  INR 0.96    BMP: Recent Labs    01/17/18 0704 01/18/18 0251 01/19/18 0338 01/20/18 0333  NA 141 142 141 139  K 3.6 3.5 3.6 4.2  CL 100 102 105 103  CO2 32 31 27 28   GLUCOSE 109* 174* 135* 298*  BUN 24* 21* 18 41*  CALCIUM 8.7* 8.5* 8.5* 8.2*  CREATININE 3.29* 3.25* 2.93* 3.53*  GFRNONAA 21* 21* 24* 19*  GFRAA 24* 24* 27* 22*    LIVER FUNCTION  TESTS: Recent Labs    01/17/18 0704 01/18/18 0832 01/19/18 0338 01/20/18 0333  BILITOT 3.9* 4.7* 2.9* 1.9*  AST 252* 219* 127* 55*  ALT 316* 325* 276* 195*  ALKPHOS 336* 341* 337* 290*  PROT 6.3* 6.3* 5.8* 5.4*  ALBUMIN 2.4* 2.2* 2.2* 2.0*    TUMOR MARKERS: No results for input(s): AFPTM, CEA, CA199, CHROMGRNA in the last 8760 hours.  Assessment and Plan:  Cholecystitis ERCP yesterday- cholangitis and choledocholithiasis Scheduled for percutaneous cholecystostomy drain placement in IR Likely OR in few weeks  Risks and benefits discussed with the patient including, but not limited to bleeding, infection, gallbladder perforation, bile leak, sepsis or even death.  All of the patient's questions were answered, patient is agreeable to proceed. Consent signed and  in chart.  Thank you for this interesting consult.  I greatly enjoyed meeting Kenneth Fox and look forward to participating in their care.  A copy of this report was sent to the requesting provider on this date.  Electronically Signed: Robet LeuURPIN,Jovana Rembold A, PA-C 01/20/2018, 9:25 AM   I spent a total of 40 Minutes    in face to face in clinical consultation, greater than 50% of which was counseling/coordinating care for perc chole drain

## 2018-01-21 LAB — COMPREHENSIVE METABOLIC PANEL
ALT: 152 U/L — ABNORMAL HIGH (ref 0–44)
ANION GAP: 8 (ref 5–15)
AST: 59 U/L — ABNORMAL HIGH (ref 15–41)
Albumin: 2 g/dL — ABNORMAL LOW (ref 3.5–5.0)
Alkaline Phosphatase: 248 U/L — ABNORMAL HIGH (ref 38–126)
BUN: 64 mg/dL — ABNORMAL HIGH (ref 6–20)
CHLORIDE: 105 mmol/L (ref 98–111)
CO2: 27 mmol/L (ref 22–32)
Calcium: 8.2 mg/dL — ABNORMAL LOW (ref 8.9–10.3)
Creatinine, Ser: 4.71 mg/dL — ABNORMAL HIGH (ref 0.61–1.24)
GFR calc non Af Amer: 13 mL/min — ABNORMAL LOW (ref >60)
GFR, EST AFRICAN AMERICAN: 15 mL/min — AB (ref >60)
Glucose, Bld: 196 mg/dL — ABNORMAL HIGH (ref 70–99)
Potassium: 3.8 mmol/L (ref 3.5–5.1)
SODIUM: 140 mmol/L (ref 135–145)
Total Bilirubin: 1.5 mg/dL — ABNORMAL HIGH (ref 0.3–1.2)
Total Protein: 5.2 g/dL — ABNORMAL LOW (ref 6.5–8.1)

## 2018-01-21 LAB — GLUCOSE, CAPILLARY
GLUCOSE-CAPILLARY: 142 mg/dL — AB (ref 70–99)
GLUCOSE-CAPILLARY: 193 mg/dL — AB (ref 70–99)
GLUCOSE-CAPILLARY: 233 mg/dL — AB (ref 70–99)
Glucose-Capillary: 200 mg/dL — ABNORMAL HIGH (ref 70–99)
Glucose-Capillary: 172 mg/dL — ABNORMAL HIGH (ref 70–99)

## 2018-01-21 MED ORDER — FUROSEMIDE 10 MG/ML IJ SOLN
80.0000 mg | Freq: Two times a day (BID) | INTRAMUSCULAR | Status: DC
  Administered 2018-01-21 (×2): 80 mg via INTRAVENOUS
  Filled 2018-01-21 (×2): qty 8

## 2018-01-21 NOTE — Progress Notes (Signed)
Gastroenterology Inpatient Follow-up Note   PATIENT IDENTIFICATION  Kenneth Fox is a 47 y.o. male with a pmh significant for IDDM, OSA, OHS, CRI, Reported Gastroparesis, who presented with abdominal pain; s/p ERCP with sphincterotomy and balloon sweep of choledocholithiasis. Hospital Day: 8  SUBJECTIVE  Patient had unsuccessful attempt at percutaneous drainage of the gallbladder. The patient denies fevers or chills. He wants to go home. He is going to be scheduled for follow up in regards to need for cholecystectomy with our surgical colleagues. LFTs continue to improve.   OBJECTIVE  Scheduled Inpatient Medications:  . cloNIDine  0.2 mg Oral BID  . furosemide  80 mg Intravenous BID  . hydrALAZINE  75 mg Oral TID  . insulin aspart  0-15 Units Subcutaneous TID WC  . insulin detemir  10 Units Subcutaneous QHS  . metoprolol tartrate  25 mg Oral BID  . sodium chloride flush  3 mL Intravenous Q12H   Continuous Inpatient Infusions:  . sodium chloride    . piperacillin-tazobactam (ZOSYN)  IV 3.375 g (01/21/18 1239)   PRN Inpatient Medications: sodium chloride, acetaminophen **OR** acetaminophen, HYDROmorphone (DILAUDID) injection, metoprolol tartrate, ondansetron **OR** ondansetron (ZOFRAN) IV, polyethylene glycol, pregabalin, promethazine **OR** promethazine **OR** promethazine, senna-docusate   Physical Examination  Temp:  [97.4 F (36.3 C)-97.8 F (36.6 C)] 97.4 F (36.3 C) (08/16 0827) Pulse Rate:  [60-80] 80 (08/16 0827) Resp:  [16-20] 20 (08/15 1544) BP: (99-137)/(58-84) 116/84 (08/16 0827) SpO2:  [94 %-100 %] 99 % (08/16 0827) Weight:  [153.3 kg] 153.3 kg (08/16 0654) Temp (24hrs), Avg:97.7 F (36.5 C), Min:97.4 F (36.3 C), Max:97.8 F (36.6 C)  Weight: (!) 153.3 kg GEN: Resting comfortably in his chair, his family is at his side PSYCH: Cooperative EYE: Sclerae anicteric ENT: MMM, [place] in place NECK: Supple, enlarged neck girth GI: NABS, soft, obese, TTP in  the RUQ in region of attempted percutaneous drain, no rebound MSK/EXT: BLE edema SKIN: No jaundice NEURO:  Alert & Oriented x 3, no focal deficits   Review of Data   Laboratory Studies   Recent Labs  Lab 01/21/18 0218  NA 140  K 3.8  CL 105  CO2 27  BUN 64*  CREATININE 4.71*  GLUCOSE 196*  CALCIUM 8.2*   Recent Labs  Lab 01/21/18 0218  AST 59*  ALT 152*  ALKPHOS 248*    Recent Labs  Lab 01/14/18 2351 01/16/18 0354 01/17/18 0704  WBC 8.8 7.0 6.9  HGB 12.2* 11.8* 12.4*  HCT 38.2* 37.9* 39.6  PLT 215 208 205   Recent Labs  Lab 01/20/18 0333  INR 0.96   Imaging Studies  Ct Aspiration  Result Date: 01/20/2018 INDICATION: Cholelithiasis and cholangitis. Status post extraction of common bile duct stones by ERCP. Request has been made to place a cholecystostomy tube to treat presumed cholecystitis as the patient is not considered a candidate for immediate cholecystectomy. CT guidance has been chosen for cholecystostomy tube placement attempt as the gallbladder is very poorly visualized by ultrasound due to the patient's large body habitus and shadowing from calculi. EXAM: CT GUIDED ASPIRATION OF GALLBLADDER WITH ATTEMPTED PERCUTANEOUS CHOLECYSTOSTOMY TUBE PLACEMENT MEDICATIONS: The patient is currently admitted to the hospital and receiving intravenous antibiotics. The antibiotics were administered within an appropriate time frame prior to the initiation of the procedure. ANESTHESIA/SEDATION: Fentanyl 200 mcg IV; Versed 3.0 mg IV Moderate Sedation Time:  45 minutes. The patient was continuously monitored during the procedure by the interventional radiology nurse under my direct supervision.  COMPLICATIONS: None immediate. PROCEDURE: Informed written consent was obtained from the patient after a thorough discussion of the procedural risks, benefits and alternatives. All questions were addressed. Maximal Sterile Barrier Technique was utilized including caps, mask, sterile gowns,  sterile gloves, sterile drape, hand hygiene and skin antiseptic. A timeout was performed prior to the initiation of the procedure. CT was performed in a supine position and both supine oblique positions. Eventually, localizing scans were performed in an oblique position with the left side down. An 18 gauge trocar needle was advanced under CT guidance to the level of the gallbladder. After puncture of the gallbladder, a small amount of bile was aspirated and sent for culture analysis. A guidewire was advanced into the gallbladder lumen. Tract dilatation was performed through the abdominal wall. Attempt was made to advance a 10 French drainage catheter into the gallbladder. Additional CT was performed. A 5 French catheter was then advanced over the guidewire and into the gallbladder lumen. Guidewire exchange was performed. Attempt was then made to advance the 10 French drainage catheter over the wire. Additional CT was performed. Attempt at further catheter advancement into the gallbladder lumen was aborted. FINDINGS: The gallbladder is not distended and there was a difficult window to placement of a cholecystostomy tube due to positioning of the liver, close proximity of the hepatic flexure of the colon to the gallbladder and the patient's body habitus. Eventually, an oblique position was found with the left side down that allowed a window to approach the gallbladder. An 18 gauge trocar needle was able to be advanced into the gallbladder lumen. There was return a few mL of dark bile. With needle advancement, hard calculi could also be palpated with the needle. There was difficulty advancing a guidewire into the gallbladder lumen due to the presence of calculi. When attempt was made to advance a cholecystostomy tube into the gallbladder lumen, CT demonstrated buckling of the guidewire outside of the gallbladder and into the peritoneal cavity. Guidewire access was able to be saved with advancement of a 5 French  catheter into the gallbladder lumen. Over a stiffer guidewire, there was still inability to advance a drainage catheter into the gallbladder lumen. This was felt to be due to poor wire purchase in the gallbladder lumen due to likely a lumen packed with calculi as well as the patient's large body habitus. After a became apparent that cholecystostomy tube placement would not be possible, the procedure was aborted. IMPRESSION: 1. Aspiration of a few mL of dark bile from the gallbladder lumen with an 18 gauge needle. This sample was sent for culture analysis. 2. Attempted placement of percutaneous cholecystostomy tube under CT guidance as above. This was unsuccessful and placement aborted as a catheter could not be advanced into the gallbladder lumen primarily secondary to inability to obtain adequate wire purchase as well as the patient's large body habitus. The gallbladder is also not distended and the lumen is likely filled with calculi based on how the guidewire is acting and ability to feel calculi in the gallbladder lumen with initial advancement of the needle. Electronically Signed   By: Irish LackGlenn  Yamagata M.D.   On: 01/20/2018 17:36   [place] Abdomen Limited Ruq  Result Date: 01/20/2018 CLINICAL DATA:  Cholangitis, choledocholithiasis and probable chronic cholecystitis. Evaluation for possible cholecystostomy tube placement. EXAM: ULTRASOUND ABDOMEN LIMITED RIGHT UPPER QUADRANT COMPARISON:  Right upper quadrant ultrasound and abdominal CT on 01/15/2018 FINDINGS: Gallbladder: The gallbladder is difficult to completely visualize by ultrasound due to body habitus.  There is continued suggestion gallbladder wall thickening and edema with wall thickness of approximately 4 mm. No focal tenderness overlying the gallbladder. The lumen is incompletely evaluated for assessment underlying gallstones. Common bile duct: Diameter: Mild dilatation measuring 9 mm in maximum diameter. No obvious filling defects in the visualized  common bile duct which is only visible near the porta hepatis. Liver: No evidence of intrahepatic biliary ductal dilatation or focal liver lesion. Portal vein is patent on color Doppler imaging with normal direction of blood flow towards the liver. IMPRESSION: Incomplete visualization of the gallbladder lumen by ultrasound. Continue suggestion of gallbladder wall thickening measuring 4 mm. Dilatation of the common bile duct measuring 9 mm in proximal diameter. Electronically Signed   By: Irish LackGlenn  Yamagata M.D.   On: 01/20/2018 10:18    ASSESSMENT  Mr. Chandra BatchFischer is a 47 y.o. male with a pmh significant for IDDM, OSA, OHS, CRI, Reported Gastroparesis, who presented with abdominal pain; s/p ERCP with sphincterotomy and balloon sweep of choledocholithiasis.  Patient has done well post ERCP with sphincterotomy and choledocholithiasis removal.  He needs an interval cholecystectomy which is planned as an outpatient.  No evidence of PEP.  GI happy to assist if needed in future.  PLAN/RECOMMENDATIONS  Low-Fat diet for at least 1-week Antibiotics as per primary team and surgery Call back PRN   Please page/call with questions or concerns.   Corliss ParishGabriel Mansouraty, MD [facility] Office # [phone]    LOS: 6 days  Lemar LoftyGabriel Mansouraty Jr  01/21/2018, 1:08 PM

## 2018-01-21 NOTE — Progress Notes (Signed)
Patient ID: Lucky Rathkehomas E Teel, male   DOB: 05/14/1971, 47 y.o.   MRN: [id number]   8/15: Procedure: Aspiration of bile from gallbladder; attempted cholecystostomy tube placement Findings: Difficult approach to gallbladder under CT guidance due to body habitus, close proximity of hepatic flexure of colon and lack of significant gallbladder distention.  GB very poorly visualized by US today, necessitating CT guidance for procedure. 18 G needle able to be advanced into GB yielding small sample of dark bile; sent for culture.  Gritty calculi felt during needle puncture.  Due to multiple calculi and lack of significant GB distention, wires consistently buckled outside lumen of GB in peritoneal fat and a cholecystostomy tube was unable to be advanced into the GB lumen.  Wire also barely entered the GB because lumen is packed with stones and contains little bile. Patient is not a candidate for future attempt at percutaneous cholecystostomy unless he clearly develops gallbladder distention in future by imaging.  Pt doing well this am Afeb Wbc wnl Up in chair Eating reg diet Denies N/V "feels weak" Worried about "fluid build up"  Abd soft NT  Site of attempted Perc chole drain is clean and dry NT no bleeding   Plan per Dr Josem KaufmannKlima and CCS

## 2018-01-21 NOTE — Progress Notes (Signed)
Internal Medicine Attending  Date: 01/21/2018  Patient name: Kenneth Fox Medical record number: [id number] Date of birth: December 25, 1970 Age: 47 y.o. Gender: male  I saw and evaluated the patient. I reviewed the resident's note by Kenneth Fox and I agree with the resident's findings and plans as documented in her progress note.  When seen on rounds this morning Kenneth Fox was feeling generally well with decreased abdominal pain and nausea. He was concerned about his lower extremity edema. Labs are notable for improving LFTs but worsening creatinine. At this point, he is felt to have a mild acute on chronic diastolic heart failure exacerbation and may benefit from gentle diuresis. He has been seen by both general surgery and GI and the plan now is for surgery in 3 weeks. He will be converted to Augmentin for 10 days and will follow-up with his primary care provider prior to his scheduled elective cholecystectomy. If his creatinine were to improve overnight he should be stable for discharge home in the morning.

## 2018-01-21 NOTE — Progress Notes (Signed)
Patient ID: Kenneth Fox, male   DOB: April 23, 1971, 47 y.o.   MRN: [id number]    2 Days Post-Op  Subjective: Patient sitting on the edge of his bed and feels ok and is ready to go home.  Tolerating his diet.  Denies abdominal pain.  Objective: Vital signs in last 24 hours: Temp:  [97.4 F (36.3 C)-97.8 F (36.6 C)] 97.4 F (36.3 C) (08/16 0827) Pulse Rate:  [60-80] 80 (08/16 0827) Resp:  [16-20] 20 (08/15 1544) BP: (99-137)/(58-101) 116/84 (08/16 0827) SpO2:  [94 %-100 %] 99 % (08/16 0827) Weight:  [153.3 kg] 153.3 kg (08/16 0654) Last BM Date: 01/20/18  Intake/Output from previous day: 08/15 0701 - 08/16 0700 In: 1040 [P.O.:240; I.V.:750; IV Piggyback:50] Out: 350 [Urine:350] Intake/Output this shift: Total I/O In: 360 [P.O.:360] Out: -   PE: Heart: regular Lungs: CTAB Abd: soft, morbidly obese, +BS, NT in RUQ  Lab Results:  No results for input(s): WBC, HGB, HCT, PLT in the last 72 hours. BMET Recent Labs    01/20/18 0333 01/21/18 0218  NA 139 140  K 4.2 3.8  CL 103 105  CO2 28 27  GLUCOSE 298* 196*  BUN 41* 64*  CREATININE 3.53* 4.71*  CALCIUM 8.2* 8.2*   PT/INR Recent Labs    01/20/18 0333  LABPROT 12.7  INR 0.96   CMP     Component Value Date/Time   NA 140 01/21/2018 0218   K 3.8 01/21/2018 0218   CL 105 01/21/2018 0218   CO2 27 01/21/2018 0218   GLUCOSE 196 (H) 01/21/2018 0218   BUN 64 (H) 01/21/2018 0218   CREATININE 4.71 (H) 01/21/2018 0218   CALCIUM 8.2 (L) 01/21/2018 0218   PROT 5.2 (L) 01/21/2018 0218   ALBUMIN 2.0 (L) 01/21/2018 0218   AST 59 (H) 01/21/2018 0218   ALT 152 (H) 01/21/2018 0218   ALKPHOS 248 (H) 01/21/2018 0218   BILITOT 1.5 (H) 01/21/2018 0218   GFRNONAA 13 (L) 01/21/2018 0218   GFRAA 15 (L) 01/21/2018 0218   Lipase     Component Value Date/Time   LIPASE 38 01/17/2018 0704       Studies/Results: Ct Aspiration  Result Date: 01/20/2018 INDICATION: Cholelithiasis and cholangitis. Status post extraction  of common bile duct stones by ERCP. Request has been made to place a cholecystostomy tube to treat presumed cholecystitis as the patient is not considered a candidate for immediate cholecystectomy. CT guidance has been chosen for cholecystostomy tube placement attempt as the gallbladder is very poorly visualized by ultrasound due to the patient's large body habitus and shadowing from calculi. EXAM: CT GUIDED ASPIRATION OF GALLBLADDER WITH ATTEMPTED PERCUTANEOUS CHOLECYSTOSTOMY TUBE PLACEMENT MEDICATIONS: The patient is currently admitted to the hospital and receiving intravenous antibiotics. The antibiotics were administered within an appropriate time frame prior to the initiation of the procedure. ANESTHESIA/SEDATION: Fentanyl 200 mcg IV; Versed 3.0 mg IV Moderate Sedation Time:  45 minutes. The patient was continuously monitored during the procedure by the interventional radiology nurse under my direct supervision. COMPLICATIONS: None immediate. PROCEDURE: Informed written consent was obtained from the patient after a thorough discussion of the procedural risks, benefits and alternatives. All questions were addressed. Maximal Sterile Barrier Technique was utilized including caps, mask, sterile gowns, sterile gloves, sterile drape, hand hygiene and skin antiseptic. A timeout was performed prior to the initiation of the procedure. CT was performed in a supine position and both supine oblique positions. Eventually, localizing scans were performed in an oblique position with the  left side down. An 18 gauge trocar needle was advanced under CT guidance to the level of the gallbladder. After puncture of the gallbladder, a small amount of bile was aspirated and sent for culture analysis. A guidewire was advanced into the gallbladder lumen. Tract dilatation was performed through the abdominal wall. Attempt was made to advance a 10 French drainage catheter into the gallbladder. Additional CT was performed. A 5 French  catheter was then advanced over the guidewire and into the gallbladder lumen. Guidewire exchange was performed. Attempt was then made to advance the 10 French drainage catheter over the wire. Additional CT was performed. Attempt at further catheter advancement into the gallbladder lumen was aborted. FINDINGS: The gallbladder is not distended and there was a difficult window to placement of a cholecystostomy tube due to positioning of the liver, close proximity of the hepatic flexure of the colon to the gallbladder and the patient's body habitus. Eventually, an oblique position was found with the left side down that allowed a window to approach the gallbladder. An 18 gauge trocar needle was able to be advanced into the gallbladder lumen. There was return a few mL of dark bile. With needle advancement, hard calculi could also be palpated with the needle. There was difficulty advancing a guidewire into the gallbladder lumen due to the presence of calculi. When attempt was made to advance a cholecystostomy tube into the gallbladder lumen, CT demonstrated buckling of the guidewire outside of the gallbladder and into the peritoneal cavity. Guidewire access was able to be saved with advancement of a 5 French catheter into the gallbladder lumen. Over a stiffer guidewire, there was still inability to advance a drainage catheter into the gallbladder lumen. This was felt to be due to poor wire purchase in the gallbladder lumen due to likely a lumen packed with calculi as well as the patient's large body habitus. After a became apparent that cholecystostomy tube placement would not be possible, the procedure was aborted. IMPRESSION: 1. Aspiration of a few mL of dark bile from the gallbladder lumen with an 18 gauge needle. This sample was sent for culture analysis. 2. Attempted placement of percutaneous cholecystostomy tube under CT guidance as above. This was unsuccessful and placement aborted as a catheter could not be  advanced into the gallbladder lumen primarily secondary to inability to obtain adequate wire purchase as well as the patient's large body habitus. The gallbladder is also not distended and the lumen is likely filled with calculi based on how the guidewire is acting and ability to feel calculi in the gallbladder lumen with initial advancement of the needle. Electronically Signed   By: Irish LackGlenn  Yamagata M.D.   On: 01/20/2018 17:36   Dg Ercp Biliary & Pancreatic Ducts  Result Date: 01/19/2018 CLINICAL DATA:  ERCP Bile duct stone Dr. Caleb PoppMansouraty [facility]roft ENDO 2 minutes 58 seconds fluoro time 3 images sent to PACS EXAM: ERCP TECHNIQUE: Multiple spot images obtained with the fluoroscopic device and submitted for interpretation post-procedure. COMPARISON:  Scintigraphy 01/18/2018 and prior studies FINDINGS: 3 intraoperative fluoroscopic spot images document endoscopic cannulation and opacification of the CBD with passage of a balloon tipped catheter. The intrahepatic ducts appear nondilated. No extravasation is identified. IMPRESSION: Endoscopic CBD cannulation and intervention These images were submitted for radiologic interpretation only. Please see the procedural report for the amount of contrast and the fluoroscopy time utilized. Electronically Signed   By: Corlis Leak  Hassell M.D.   On: 01/19/2018 13:26   [place] Abdomen Limited Ruq  Result  Date: 01/20/2018 CLINICAL DATA:  Cholangitis, choledocholithiasis and probable chronic cholecystitis. Evaluation for possible cholecystostomy tube placement. EXAM: ULTRASOUND ABDOMEN LIMITED RIGHT UPPER QUADRANT COMPARISON:  Right upper quadrant ultrasound and abdominal CT on 01/15/2018 FINDINGS: Gallbladder: The gallbladder is difficult to completely visualize by ultrasound due to body habitus. There is continued suggestion gallbladder wall thickening and edema with wall thickness of approximately 4 mm. No focal tenderness overlying the gallbladder. The lumen is incompletely evaluated  for assessment underlying gallstones. Common bile duct: Diameter: Mild dilatation measuring 9 mm in maximum diameter. No obvious filling defects in the visualized common bile duct which is only visible near the porta hepatis. Liver: No evidence of intrahepatic biliary ductal dilatation or focal liver lesion. Portal vein is patent on color Doppler imaging with normal direction of blood flow towards the liver. IMPRESSION: Incomplete visualization of the gallbladder lumen by ultrasound. Continue suggestion of gallbladder wall thickening measuring 4 mm. Dilatation of the common bile duct measuring 9 mm in proximal diameter. Electronically Signed   By: Irish LackGlenn  Yamagata M.D.   On: 01/20/2018 10:18    Anti-infectives: Anti-infectives (From admission, onward)   Start     Dose/Rate Route Frequency Ordered Stop   01/15/18 1200  piperacillin-tazobactam (ZOSYN) IVPB 3.375 g     3.375 g 12.5 mL/hr over 240 Minutes Intravenous Every 8 hours 01/15/18 0833     01/15/18 0530  piperacillin-tazobactam (ZOSYN) IVPB 3.375 g     3.375 g 100 mL/hr over 30 Minutes Intravenous  Once 01/15/18 0517 01/15/18 0720       Assessment/Plan Likely chronic cholecystitis, choledocholithiasis, s/p ERCP -successful ERCP -unsuccessful placement of a perc chole drain yesterday in IR due to body habitus and underdistended gallbladder -patient doing ok today.  LFTs continue to trend down.  Will plan for outpatient cholecystectomy in several weeks after his acute problems improve and it allows his gallbladder to settle down as well.  He is very high risk for surgical intervention now secondary to risk for complications etc.  He will need an additional 10 days of oral abx therapy to go home on.  Our office is going to be contacted to set up his surgery and will contact him. -cont carb mod/heart healthy diet.  Acute on chronic renal failure Defer to medicine.  Creatinine up to 4.7 today.  Baseline around 2.8  FEN - carb mod/heart  healthy diet VTE - TED hose ID - Zosyn, can switch to oral augmentin.  Will need 10 additional days.   LOS: 6 days    Letha CapeKelly E Cayleb Jarnigan , [facility] 01/21/2018, 10:40 AM Pager: [phone]

## 2018-01-21 NOTE — Progress Notes (Addendum)
Subjective: [name] reported feeling better today. He denied any abdominal pain, nausea or vomiting. He was seen off of his [place] but did not feel SOB. He said his leg swelling had improved some but he felt some heaviness in his lower back. He was tolerating PO intake well. He also denied any fevers, chills or weakness.  Objective:  Vital signs in last 24 hours: Vitals:   01/21/18 0500 01/21/18 0654 01/21/18 0827 01/21/18 1314  BP:   116/84 134/76  Pulse:   80 75  Resp:      Temp:   (!) 97.4 F (36.3 C) 98.5 F (36.9 C)  TempSrc:   Oral   SpO2:   99% 100%  Weight: (!) 153.3 kg (!) 153.3 kg    Height:       Physical Exam  Constitutional: He is oriented to person, place, and time and well-developed, well-nourished, and in no distress.  Cardiovascular: Normal rate, regular rhythm and normal heart sounds.  No murmur heard. Pulmonary/Chest: Effort normal. No respiratory distress. He has no wheezes.  right lower lobe crackles  Abdominal: Soft. Bowel sounds are normal. He exhibits no distension. There is no tenderness. There is no guarding.  Musculoskeletal: He exhibits edema. He exhibits no tenderness.  Left LE pitting edema  Neurological: He is alert and oriented to person, place, and time.    Assessment/Plan:  Principal Problem:   Acute cholecystitis Active Problems:   Essential hypertension   OSA on CPAP   Diabetes mellitus type 2, insulin dependent (HCC)   Oxygen dependent   Elevated liver function tests   Choledocholithiasis with acute cholecystitis with obstruction   Cholangitis due to bile duct calculus with obstruction   Obstructive sleep apnea  Kenneth Fox is a 47 year old male with a PMHx of diastolic CHF, obstructive sleep apnea, HTN, Type II DM, gastroparesis and morbid obesity presenting with abdominal pain, elevated LFT's and imaging suggestive of acute cholecystitis and choledocholithiasis.   Acute cholecystitis with choledocholithiasis, s/p  ERCP: Patient underwent CT guided percutaneous cholecystostomy placement yesterday; unfortunately, unsuccessful due to calculi, under distended gallbladder and body habitus. LFT's have continued to improve. Surgery recommends planning an outpatient cholecystectomy in ~3 weeks as he is high risk for surgical complications right now. Also recommended an additional 10 days of Augmentin therapy.  -switch zoysn to augmentin x10 days on discharge  -f/u surgery outpatient -hold Lovenoxfor 48-72 hours following ERCP, per GI  -continue renal/carb diet with fluid restrictions  HTN:134/76. Increased hydralazine to 75 mg TID. -continue clonidine 0.2 mg oral BID -continue hydralazine75mg  TID -continue metoprolol 25 mg BID  Diastolic HF:  -see HTN therapy above   CKD: Cr 4.71, baseline 2.8. Most likely in the setting of volume overload. Nephrotoxic agents were held during previous hospital admission due to dehydration. Will restart lasix 80 mg IV BID. Will recommend continuing diuretic therapy at time of discharge as well.  -start lasix 80 mg IV BID  -f/u cmp -f/u with pcp to optimize diuretic therapy   OSA and obesity hypoventilation syndrome Chronic hypoxic respiratory failure:  -continue 3L [place] -CPAP qhs  Type II DM: CBG 142 -Levemir 10 units, novolog 10 units with SSI -Lyrica 100 mg TID PRN  -Reglan 10 mg IV x1 -continue lyrica 100 mg TID   Dispo: Anticipated discharge in approximately 1 day(s). Patient to receive diuretic therapy to improve volume status and kidney function. If Cr improves he will be medically stable for discharge tomorrow.   Kenneth Popiel  N, DO 01/21/2018, 1:19 PM Pager: [phone]

## 2018-01-21 NOTE — Plan of Care (Signed)
Nutrition Education Note  RD consulted for nutrition education regarding a Heart Healthy/Carbohydrate modified diet  RD provided "Heart Healthy Consistent Carbohydrate Nutrition Therapy" handout from the [employer]. Reviewed patient's dietary recall. Provided examples on ways to decrease sodium and fat intake in diet. Discouraged intake of processed foods and use of salt shaker. Encouraged fresh fruits and vegetables as well as whole grain sources of carbohydrates to maximize fiber intake. Discussed diabetic friendly drink options.Teach back method used.  Expect good compliance.  Body mass index is 47.14 kg/m. Pt meets criteria for morbid obesity based on current BMI.  Current diet order is heart healthy/carbohydrate modified, patient is consuming approximately 75% of meals at this time. Labs and medications reviewed. No further nutrition interventions warranted at this time. RD contact information provided. If additional nutrition issues arise, please re-consult RD.  Kenneth SmilingStephanie Jakeria Caissie, MS, RD, LDN Pager # [phone] After hours/ weekend pager # [phone]

## 2018-01-21 NOTE — Progress Notes (Signed)
Paged Dr. Josem KaufmannKlima, pt has has a wt gain of almost 10 lbs since admission. Per nephrology notes patient states that he takes furosemide and sprinolactone at home. Those medications are not being given.   Patient did receive TED hose yesterday on my shift for the increased pitting edema. Patient states that he had a pair initially but "they got dirty". I advised patient that he can wash them out in the sink and allow them to air dry. He says his wife took them home on Wednesday and forgot to bring them back.   Patient has been educated on his lifestyle and diet and how they related to his multiple co-morbidities.  Patient needs further education and asked for someone to come and discuss what he can and cannot eat.    Nurse put in a dietary consult related to this request.

## 2018-01-22 ENCOUNTER — Inpatient Hospital Stay (HOSPITAL_COMMUNITY): Payer: Medicare Other

## 2018-01-22 DIAGNOSIS — K8040 Calculus of bile duct with cholecystitis, unspecified, without obstruction: Secondary | ICD-10-CM

## 2018-01-22 LAB — BASIC METABOLIC PANEL
ANION GAP: 9 (ref 5–15)
BUN: 63 mg/dL — ABNORMAL HIGH (ref 6–20)
CALCIUM: 8 mg/dL — AB (ref 8.9–10.3)
CO2: 27 mmol/L (ref 22–32)
Chloride: 104 mmol/L (ref 98–111)
Creatinine, Ser: 5.23 mg/dL — ABNORMAL HIGH (ref 0.61–1.24)
GFR, EST AFRICAN AMERICAN: 14 mL/min — AB (ref >60)
GFR, EST NON AFRICAN AMERICAN: 12 mL/min — AB (ref >60)
GLUCOSE: 240 mg/dL — AB (ref 70–99)
POTASSIUM: 3.6 mmol/L (ref 3.5–5.1)
Sodium: 140 mmol/L (ref 135–145)

## 2018-01-22 LAB — URINALYSIS, ROUTINE W REFLEX MICROSCOPIC
Bacteria, UA: NONE SEEN
Bilirubin Urine: NEGATIVE
GLUCOSE, UA: 150 mg/dL — AB
KETONES UR: NEGATIVE mg/dL
LEUKOCYTES UA: NEGATIVE
NITRITE: NEGATIVE
PH: 5 (ref 5.0–8.0)
PROTEIN: 100 mg/dL — AB
Specific Gravity, Urine: 1.011 (ref 1.005–1.030)

## 2018-01-22 LAB — GLUCOSE, CAPILLARY
GLUCOSE-CAPILLARY: 191 mg/dL — AB (ref 70–99)
GLUCOSE-CAPILLARY: 231 mg/dL — AB (ref 70–99)

## 2018-01-22 LAB — MAGNESIUM: Magnesium: 1.9 mg/dL (ref 1.7–2.4)

## 2018-01-22 MED ORDER — FUROSEMIDE 10 MG/ML IJ SOLN
120.0000 mg | Freq: Two times a day (BID) | INTRAVENOUS | Status: DC
  Administered 2018-01-22 (×2): 120 mg via INTRAVENOUS
  Filled 2018-01-22: qty 12
  Filled 2018-01-22: qty 10
  Filled 2018-01-22: qty 12

## 2018-01-22 MED ORDER — AMOXICILLIN-POT CLAVULANATE 875-125 MG PO TABS: 1.0000 | ORAL_TABLET | Freq: Two times a day (BID) | ORAL | Status: DC

## 2018-01-22 MED ORDER — INSULIN ASPART 100 UNIT/ML ~~LOC~~ SOLN
0.0000 [IU] | Freq: Every day | SUBCUTANEOUS | Status: DC
  Administered 2018-01-22: 2 [IU] via SUBCUTANEOUS

## 2018-01-22 MED ORDER — INSULIN ASPART 100 UNIT/ML ~~LOC~~ SOLN
0.0000 [IU] | Freq: Three times a day (TID) | SUBCUTANEOUS | Status: DC
  Administered 2018-01-22: 3 [IU] via SUBCUTANEOUS
  Administered 2018-01-22: 7 [IU] via SUBCUTANEOUS
  Administered 2018-01-23: 3 [IU] via SUBCUTANEOUS
  Administered 2018-01-23 (×2): 7 [IU] via SUBCUTANEOUS

## 2018-01-22 MED ORDER — HEPARIN SODIUM (PORCINE) 5000 UNIT/ML IJ SOLN
5000.0000 [IU] | Freq: Three times a day (TID) | INTRAMUSCULAR | Status: DC
  Administered 2018-01-22 – 2018-01-24 (×6): 5000 [IU] via SUBCUTANEOUS
  Filled 2018-01-22 (×6): qty 1

## 2018-01-22 NOTE — Progress Notes (Signed)
   Subjective: Patient frustrated this AM. He is wanting to leave and states that he has things he needs to do. Feels that the medications are not working. We discussed that if he were to leave things could get worse and there is the possibility of death. He has agree to stay at least today and re-assess tomorrow.   Objective: Vital signs in last 24 hours: Vitals:   01/21/18 2200 01/21/18 2348 01/22/18 0348 01/22/18 0845  BP: 115/66 (!) 113/58 112/70 (!) 127/91  Pulse: 79 78 77 (!) 119  Resp:    18  Temp:  98.5 F (36.9 C) 97.7 F (36.5 C) (!) 97.5 F (36.4 C)  TempSrc:  Oral Oral Oral  SpO2:  95% 99% 95%  Weight:      Height:       General: Obese male in no acute distress CV: RRR, no murmurs, no rubs  Abdomen: Firm, distended, no tenderness to palpation  Extremities: Severe pitting edema of the LEs Neuro: Alert and oriented x 3  Assessment/Plan:  [name] is a 47 year old male with HFpEF, obstructive sleep apnea and subsequent right ventricular failure, HTN, Type II DM, gastroparesis and morbid obesity presenting with abdominal pain, elevated LFT's and imaging suggestive of acute cholecystitis. Underwent ERCP on 8/14 that illustrated choledocholithiasis with acute cholangitis.   Acute cholecystitis with choledocholithiasis, s/p ERCP on 8/14 and unsuccessful percutaneous drain placement on 8/15: - Abdominal pain improved - LFTs trending down - Appreciate surgery and GI consults; plan for Zosyn while hospitalized, followed by 10 days of PO Augmentin and cholecystectomy in 3 weeks.  - Restarting DVT prophylaxis today   Volume Overload  HFpEF RV dysfunction 2/2 OSA and HFpEF - Echocardiogram on 8/10 with LVEF 55-60% with LVH and RV dysfunction  - Mg 1.9 and K 3.6, replacing as needed - Aggressive diuresis with IV Furosemide 120 mg BID  - Creatinine increased today, likely from congestion - Fluid restriction  - Strict I&O's  - Daily weights  HTN, well controlled -  continue clonidine 0.2 mg oral BID, hydralazine75mg  TID, and metoprolol 25 mg BID  AKI on CKD - Creatinine up to 5.23 from baseline of 2.8.  - Likely pre-renal etiology from congestion; however, checking UA, renal ultrasounds, and bladder scan - Continuing aggressive diuresis   OSA and obesity hypoventilation syndrome Chronic hypoxic respiratory failure:  - Continue 3L [place] - CPAP qhs  Type II DM - CBGs above goal of <180  - Increased SSI to resistant and added night coverage  - Continue Levemir 10 units, but will likely need to be increased  Dispo: Anticipated discharge pending improvement in volume status and renal function.   Kenneth Fox, Kenneth Gair, MD 01/22/2018, 10:02 AM Pager: [phone]

## 2018-01-22 NOTE — Progress Notes (Signed)
3 Days Post-Op   Subjective/Chief Complaint: Up in chair, denies sig RUQ pain   Objective: Vital signs in last 24 hours: Temp:  [97.5 F (36.4 C)-98.8 F (37.1 C)] 97.5 F (36.4 C) (08/17 0845) Pulse Rate:  [66-119] 119 (08/17 0845) Resp:  [18] 18 (08/17 0845) BP: (106-134)/(58-91) 127/91 (08/17 0845) SpO2:  [95 %-100 %] 95 % (08/17 0845) Last BM Date: 01/20/18  Intake/Output from previous day: 08/16 0701 - 08/17 0700 In: 943 [P.O.:840; I.V.:3; IV Piggyback:100] Out: 1000 [Urine:1000] Intake/Output this shift: No intake/output data recorded.  General appearance: cooperative Resp: clear to auscultation bilaterally Cardio: regular rate and rhythm GI: soft, min RUQ tenderness  Lab Results:  No results for input(s): WBC, HGB, HCT, PLT in the last 72 hours. BMET Recent Labs    01/21/18 0218 01/22/18 0238  NA 140 140  K 3.8 3.6  CL 105 104  CO2 27 27  GLUCOSE 196* 240*  BUN 64* 63*  CREATININE 4.71* 5.23*  CALCIUM 8.2* 8.0*   PT/INR Recent Labs    01/20/18 0333  LABPROT 12.7  INR 0.96   ABG No results for input(s): PHART, HCO3 in the last 72 hours.  Invalid input(s): PCO2, PO2  Studies/Results: Ct Aspiration  Result Date: 01/20/2018 INDICATION: Cholelithiasis and cholangitis. Status post extraction of common bile duct stones by ERCP. Request has been made to place a cholecystostomy tube to treat presumed cholecystitis as the patient is not considered a candidate for immediate cholecystectomy. CT guidance has been chosen for cholecystostomy tube placement attempt as the gallbladder is very poorly visualized by ultrasound due to the patient's large body habitus and shadowing from calculi. EXAM: CT GUIDED ASPIRATION OF GALLBLADDER WITH ATTEMPTED PERCUTANEOUS CHOLECYSTOSTOMY TUBE PLACEMENT MEDICATIONS: The patient is currently admitted to the hospital and receiving intravenous antibiotics. The antibiotics were administered within an appropriate time frame prior to  the initiation of the procedure. ANESTHESIA/SEDATION: Fentanyl 200 mcg IV; Versed 3.0 mg IV Moderate Sedation Time:  45 minutes. The patient was continuously monitored during the procedure by the interventional radiology nurse under my direct supervision. COMPLICATIONS: None immediate. PROCEDURE: Informed written consent was obtained from the patient after a thorough discussion of the procedural risks, benefits and alternatives. All questions were addressed. Maximal Sterile Barrier Technique was utilized including caps, mask, sterile gowns, sterile gloves, sterile drape, hand hygiene and skin antiseptic. A timeout was performed prior to the initiation of the procedure. CT was performed in a supine position and both supine oblique positions. Eventually, localizing scans were performed in an oblique position with the left side down. An 18 gauge trocar needle was advanced under CT guidance to the level of the gallbladder. After puncture of the gallbladder, a small amount of bile was aspirated and sent for culture analysis. A guidewire was advanced into the gallbladder lumen. Tract dilatation was performed through the abdominal wall. Attempt was made to advance a 10 French drainage catheter into the gallbladder. Additional CT was performed. A 5 French catheter was then advanced over the guidewire and into the gallbladder lumen. Guidewire exchange was performed. Attempt was then made to advance the 10 French drainage catheter over the wire. Additional CT was performed. Attempt at further catheter advancement into the gallbladder lumen was aborted. FINDINGS: The gallbladder is not distended and there was a difficult window to placement of a cholecystostomy tube due to positioning of the liver, close proximity of the hepatic flexure of the colon to the gallbladder and the patient's body habitus. Eventually, an oblique  position was found with the left side down that allowed a window to approach the gallbladder. An 18 gauge  trocar needle was able to be advanced into the gallbladder lumen. There was return a few mL of dark bile. With needle advancement, hard calculi could also be palpated with the needle. There was difficulty advancing a guidewire into the gallbladder lumen due to the presence of calculi. When attempt was made to advance a cholecystostomy tube into the gallbladder lumen, CT demonstrated buckling of the guidewire outside of the gallbladder and into the peritoneal cavity. Guidewire access was able to be saved with advancement of a 5 French catheter into the gallbladder lumen. Over a stiffer guidewire, there was still inability to advance a drainage catheter into the gallbladder lumen. This was felt to be due to poor wire purchase in the gallbladder lumen due to likely a lumen packed with calculi as well as the patient's large body habitus. After a became apparent that cholecystostomy tube placement would not be possible, the procedure was aborted. IMPRESSION: 1. Aspiration of a few mL of dark bile from the gallbladder lumen with an 18 gauge needle. This sample was sent for culture analysis. 2. Attempted placement of percutaneous cholecystostomy tube under CT guidance as above. This was unsuccessful and placement aborted as a catheter could not be advanced into the gallbladder lumen primarily secondary to inability to obtain adequate wire purchase as well as the patient's large body habitus. The gallbladder is also not distended and the lumen is likely filled with calculi based on how the guidewire is acting and ability to feel calculi in the gallbladder lumen with initial advancement of the needle. Electronically Signed   By: Irish LackGlenn  Yamagata M.D.   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  LFTs continue to trend down.  Will plan for outpatient cholecystectomy in several weeks after his acute problems improve and it allows his gallbladder to settle down as well.  He is very high risk for surgical intervention now secondary to risk for complications etc.  He will need an additional 10 days of oral abx therapy to go home on.  Our office is going to be contacted to set up his surgery and will contact him. He will be followed by Dr. Dwain SarnaWakefield at [facility]. -cont carb mod/heart healthy diet.  Acute on chronic renal failure Defer to medicine.  Creatinine up today  FEN - carb mod/heart healthy diet VTE - TED hose ID - oral augmentin.  Will need 9 additional days.  LOS: 7 days    Liz MaladyBurke E Edge Mauger 01/22/2018

## 2018-01-22 NOTE — Progress Notes (Signed)
Internal Medicine Attending:   I saw and examined the patient. I reviewed the resident's note and I agree with the resident's findings and plan as documented in the resident's note.  Hospital day 7 with acute choledocholithiasis and cholecystitis which seems to have improved nicely, he has no residual abdominal pain and is eating and drinking well.  Most high risk right now is acute on chronic renal failure with creatinine still rising above 5 today.  He has a history of chronic kidney disease with 5 episodes in the last 3 years of acute kidney injuries.  I think current worsening renal function probably multifactorial, due to venous congestion in the setting of volume overload and heart failure with preserved ejection fraction.  Could also be related to hypotension during ERCP on 8/14.  Please check urinalysis, postvoid residuals to rule out bladder outlet obstruction, last renal ultrasound done just 2 weeks ago and was normal.  Continue with diuresis, hopefully renal function will improve as we decrease venous congestion.

## 2018-01-22 NOTE — Progress Notes (Addendum)
Pharmacy Antibiotic Note  Kenneth Fox is a 47 y.o. male admitted on 01/14/2018 with abdominal pain and nausea since discharge recently.  Imaging shows stones in gallbladder, possible cholangitis.  Pharmacy has been consulted for Zosyn dosing for intra-abdominal infection.  SCr trending up to 5.23 (b/l 2.8), CrCL 25.7 ml/min, afebrile, WBC WNL.   Plan: Continue Zosyn 3.375g IV q8h given over 4 hours Change to PO augmentin x10 days on discharge per surgery  Monitor for further decreases in renal function, may necessitate dose changes.  Height: 5\' 11"  (180.3 cm) Weight: (!) 337 lb 15.4 oz (153.3 kg) IBW/kg (Calculated) : 75.3  Temp (24hrs), Avg:98.4 F (36.9 C), Min:97.7 F (36.5 C), Max:98.8 F (37.1 C)  Recent Labs  Lab 01/16/18 0354 01/17/18 0704 01/18/18 0251 01/19/18 0338 01/20/18 0333 01/21/18 0218 01/22/18 0238  WBC 7.0 6.9  --   --   --   --   --   CREATININE 2.97* 3.29* 3.25* 2.93* 3.53* 4.71* 5.23*    Estimated Creatinine Clearance: 25.7 mL/min (A) (by C-G formula based on SCr of 5.23 mg/dL (H)).    Allergies  Allergen Reactions  . Angiotensin Receptor Blockers Other (See Comments)    Acute renal failure in patient w R heart failure  . Lisinopril Other (See Comments)    Patient developed AKI after being on lisinopril for a week.    Zosyn 8/10 >>  8/10 BCx - NGTD  Thank you for involving pharmacy in this patient's care.  Wendelyn Breslowylan Hanna, PharmD PGY1 Pharmacy Resident Phone: [phone] 01/22/2018 8:42 AM

## 2018-01-23 ENCOUNTER — Encounter (HOSPITAL_COMMUNITY): Payer: Self-pay | Admitting: Gastroenterology

## 2018-01-23 LAB — BASIC METABOLIC PANEL
ANION GAP: 7 (ref 5–15)
BUN: 55 mg/dL — ABNORMAL HIGH (ref 6–20)
CALCIUM: 8.1 mg/dL — AB (ref 8.9–10.3)
CO2: 30 mmol/L (ref 22–32)
Chloride: 104 mmol/L (ref 98–111)
Creatinine, Ser: 5.05 mg/dL — ABNORMAL HIGH (ref 0.61–1.24)
GFR calc non Af Amer: 12 mL/min — ABNORMAL LOW (ref >60)
GFR, EST AFRICAN AMERICAN: 14 mL/min — AB (ref >60)
Glucose, Bld: 157 mg/dL — ABNORMAL HIGH (ref 70–99)
POTASSIUM: 3 mmol/L — AB (ref 3.5–5.1)
Sodium: 141 mmol/L (ref 135–145)

## 2018-01-23 LAB — GLUCOSE, CAPILLARY
GLUCOSE-CAPILLARY: 268 mg/dL — AB (ref 70–99)
GLUCOSE-CAPILLARY: 142 mg/dL — AB (ref 70–99)
GLUCOSE-CAPILLARY: 199 mg/dL — AB (ref 70–99)
Glucose-Capillary: 194 mg/dL — ABNORMAL HIGH (ref 70–99)
Glucose-Capillary: 89 mg/dL (ref 70–99)
Glucose-Capillary: 201 mg/dL — ABNORMAL HIGH (ref 70–99)

## 2018-01-23 MED ORDER — DEXTROSE 5 % IV SOLN
120.0000 mg | Freq: Two times a day (BID) | INTRAVENOUS | Status: DC
  Administered 2018-01-23 – 2018-01-24 (×3): 120 mg via INTRAVENOUS
  Filled 2018-01-23: qty 10
  Filled 2018-01-23 (×2): qty 12
  Filled 2018-01-23: qty 2

## 2018-01-23 MED ORDER — AMOXICILLIN-POT CLAVULANATE 500-125 MG PO TABS
1.0000 | ORAL_TABLET | Freq: Two times a day (BID) | ORAL | Status: DC
  Administered 2018-01-23 (×2): 500 mg via ORAL
  Filled 2018-01-23 (×3): qty 1

## 2018-01-23 MED ORDER — POTASSIUM CHLORIDE CRYS ER 20 MEQ PO TBCR
40.0000 meq | EXTENDED_RELEASE_TABLET | Freq: Two times a day (BID) | ORAL | Status: AC
  Administered 2018-01-23 (×2): 40 meq via ORAL
  Filled 2018-01-23 (×2): qty 2

## 2018-01-23 NOTE — Progress Notes (Addendum)
Subjective: Mr. Chandra BatchFischer was seen sitting upright with no Wittmann. He said he was feeling cold but denied any abdominal pain, SOB, chest pain, nausea, vomiting, fever or chills. He said he had been urinating a lot and felt like his leg swelling was improved. He felt hungry and that he has not been getting enough food. He said he wanted to go home and how he hasn't been sleeping well here. It was explained to him we were watching his kidney function closely; he understood and agreed to stay for now.   Objective:  Vital signs in last 24 hours: Vitals:   01/23/18 0300 01/23/18 0700 01/23/18 0822 01/23/18 0824  BP: 117/66  135/76   Pulse: 60  (!) 59   Resp:      Temp: (!) 97.3 F (36.3 C) 97.7 F (36.5 C)    TempSrc: Oral     SpO2: 99%  (!) 83% 99%  Weight: (!) 150.6 kg     Height:       Physical Exam  Constitutional: He is oriented to person, place, and time and well-developed, well-nourished, and in no distress.  Cardiovascular: Normal rate, regular rhythm and normal heart sounds.  No murmur heard. Pulmonary/Chest: Effort normal and breath sounds normal. No respiratory distress. He has no wheezes. He has no rales.  Abdominal: Soft. Bowel sounds are normal. He exhibits no distension. There is no tenderness. There is no guarding.  Musculoskeletal: He exhibits edema.  +1 pitting bilateral LE   Neurological: He is alert and oriented to person, place, and time.  Skin: Skin is warm and dry.    Assessment/Plan:  Principal Problem:   Acute cholecystitis Active Problems:   Essential hypertension   OSA on CPAP   Diabetes mellitus type 2, insulin dependent (HCC)   Oxygen dependent   Elevated liver function tests   Choledocholithiasis with acute cholecystitis with obstruction   Cholangitis due to bile duct calculus with obstruction   Obstructive sleep apnea  Mr. Weinman is a 47 year old male with a PMHx of diastolic CHF, obstructive sleep apnea, HTN, Type II DM, gastroparesis and morbid  obesity presenting with abdominal pain, elevated LFT's and imaging suggestive of acute cholecystitis and choledocholithiasis. Underwent ERCP on 8/14 that illustrated choledocholithiasis with acute cholangitis. Renal function has worsened in the setting of volume overload and heart failure with preserved ejection fraction.  Acute cholecystitis with choledocholithiasis, s/p ERCP: Clinically improved, denies any abdominal pain. Switched antibiotic therapy from zosyn to augmentin; there was concern zoysn could be contributing to AKI. He was switched from zoysn to augmentin yesterday but this was unfortunately held due to contraindications with AKI. Resumed today. - continue x10 days on discharge  -f/u surgery outpatient for cholecystectomy in ~3 weeks -continue renal/carb diet with fluid restrictions  Volume overload HFpEF RV dysfunction 2/2 OSA and HFpEF: Patient is negative -2.0 L in the past 24 hours. K+ 3.0, repleted with 40 mEq x2. -fluid restriction -strict I&O's  -daily weights  AKI on CKD: Cr5.05, baseline 2.8. UA positive for small Hgb and protein 100. Renal US 8/17 normal findings. Continue aggressive diuretic therapy.  -continue furosemide 120 mg IV  -f/u cmet -f/u with pcp to optimize diuretic therapy and repeat UA   HTN: 117/66. Stable -continue clonidine 0.2 mg oral BID -continue hydralazine75mg  TID -continue metoprolol 25 mg BID  OSA and obesity hypoventilation syndrome Chronic hypoxic respiratory failure:  -continue 3L [place] -CPAP qhs  Type II DM: CBG 89 -Levemir 10 units, novolog 10  units with SSI -Lyrica 100 mg TID PRN  -Reglan 10 mg IV x1 -continue lyrica 100 mg TID   Dispo: Anticipated discharge is pending improvement of renal function and volume status.   Rehman, Areeg N, DO 01/23/2018, 10:05 AM Pager: [phone]

## 2018-01-23 NOTE — Discharge Summary (Addendum)
Name: Kenneth Fox MRN: [id number] DOB: Oct 06, 1970 47 y.o. PCP: Lindaann PascalLong, [name], PA-C  Date of Admission: 01/14/2018 11:34 PM Date of Discharge: 01/24/2018 Attending Physician: Dr. Doneen PoissonKlima, Lawrence  Discharge Diagnosis: 1. Choledocholithiasis with cholangitis 2. AKI on CKD 3. HTN   Discharge Medications: Allergies as of 01/24/2018      Reactions   Angiotensin Receptor Blockers Other (See Comments)   Acute renal failure in patient w R heart failure   Lisinopril Other (See Comments)   Patient developed AKI after being on lisinopril for a week.      Medication List    TAKE these medications   acetaminophen 500 MG tablet Commonly known as:  TYLENOL Take 1 tablet (500 mg total) by mouth every 6 (six) hours as needed. What changed:  reasons to take this   amoxicillin-clavulanate 500-125 MG tablet Commonly known as:  AUGMENTIN Take 1 tablet (500 mg total) by mouth every 12 (twelve) hours for 10 days.   cloNIDine 0.2 MG tablet Commonly known as:  CATAPRES Take 0.2 mg by mouth 2 (two) times daily.   furosemide 80 MG tablet Commonly known as:  LASIX Take 1 tablet (80 mg total) by mouth 2 (two) times daily.   hydrALAZINE 50 MG tablet Commonly known as:  APRESOLINE Take 1 tablet (50 mg total) by mouth 3 (three) times daily. What changed:    medication strength  how much to take   insulin aspart 100 UNIT/ML injection Commonly known as:  novoLOG Inject 10-20 Units into the skin 3 (three) times daily with meals. per sliding scale CBG 70 - 120: 0 units CBG 121 - 150: 2 units CBG 151 - 200: 3 units CBG 201 - 250: 5 units CBG 251 - 300: 8 units CBG 301 - 350: 11 units CBG 351 - 400: 15 units   insulin detemir 100 UNIT/ML injection Commonly known as:  LEVEMIR Inject 0.8 mLs (80 Units total) into the skin every evening. What changed:    how much to take  when to take this   metoCLOPramide 10 MG tablet Commonly known as:  REGLAN Take 0.5 tablets (5 mg total) by  mouth every 8 (eight) hours as needed for nausea.   metoprolol tartrate 25 MG tablet Commonly known as:  LOPRESSOR Take 1 tablet (25 mg total) by mouth 2 (two) times daily.   Oxycodone HCl 10 MG Tabs Take 10 mg by mouth every 6 (six) hours as needed for pain.   polyethylene glycol packet Commonly known as:  MIRALAX / GLYCOLAX Take 17 g by mouth daily as needed for mild constipation.   pregabalin 75 MG capsule Commonly known as:  LYRICA Take 1 capsule (75 mg total) by mouth 2 (two) times daily.       Disposition and follow-up:   [facility]Kenneth Fox [facility].  At the hospital follow up visit please address:  1.  Choledocholithiasis with cholangitis: patient to follow up with surgery in ~3 weeks for outpatient cholecystectomy  2. AKI on CKD: patient restarted on lasix 80 mg BID, assess volume status and adjust as necessary  3. HTN: increased patient's hydralazine to 50 mg TID, reassess BP management   4.  Labs / imaging needed at time of follow-up: UA, Cmet  5.  Pending labs/ test needing follow-up: none  Follow-up Appointments: Follow-up Information    Long, [name], [place]-C.   Specialty:  Physician Assistant Contact information: [address] [place] [phone]  [phone]        Emelia Loron, MD Follow up in 3 week(s).   Specialty:  General Surgery Contact information: [address] [place] [address] [phone]           Hospital Course by problem list: 1. Choledocholithiasis with cholangitis: Patient came in with abdominal pain, elevated LFT's and imaging suggestive of acute cholecystitis and choledocholithiasis. He was unable to get an MRCP due to anxiety. Underwent ERCP on 8/14 that illustrated choledocholithiasis with acute cholangitis. There was an unsuccessful attempt of percutaneous cholecystostomy placement on 8/15 due to calculi, body habitus and minimal GB  distension. Patient improved clinically, LFT's trended downwards and no post ERCP complications. Surgery recommended outpatient cholecystectomy in ~3 weeks with 10 days of augmentin on discharge.  2. AKI on CKD: Patient unfortunately had worsening renal function, likely multifactorial due to venous congestion in the setting of volume overload and heart failure with preserved ejection fraction. Also hypotension during ERCP may have contributed. UA was unremarkable, with positive Hgb and 100 protein. Renal US showed no acute abnormalities. Aggressive diuretic therapy was continued with improvement of Cr to 3.88, baseline 2.8 Discharged on furosemide 80 mg BID.  3. HTN: Patient presented with uncontrolled BP in the 200s/100s. Continued his clonidine and metoprolol and increased hydralazine to 75 mg (from 25 mg) TID. Recommend tapering off of clonidine due to low HR's on both metoprolol and clonidine therapy. Discharged patient on 50 mg hydralazine TID and to continue clonidine and metoprolol as prescribed.  4. Chronic hypoxic respiratory failure: PFT's were ordered to assess pulm status prior to possible surgery. Restrictive lung disease appreciated, recommended to continue CPAP at night, minimize narcotic use post-operatively, and optimize CHF treatment. Patient was seen not using [place] oxygen while many times while rounding; may need to reassess if O2 is needed.    Discharge Vitals:   BP 124/68 (BP Location: Right Arm)   Pulse 66   Temp (!) 97.2 F (36.2 C)   Resp 18   Ht 5\' 11"  (1.803 m)   Wt (!) 147.6 kg   SpO2 99%   BMI 45.38 kg/m   Pertinent Labs, Studies, and Procedures:   CMP Latest Ref Rng & Units 01/24/2018 01/23/2018 01/22/2018  Glucose 70 - 99 mg/dL 130(Q) 657(Q) 469(G)  BUN 6 - 20 mg/dL 29(B) 28(U) 13(K)  Creatinine 0.61 - 1.24 mg/dL 4.40(N) 0.27(O) 5.36(U)  Sodium 135 - 145 mmol/L 142 141 140  Potassium 3.5 - 5.1 mmol/L 3.4(L) 3.0(L) 3.6  Chloride 98 - 111 mmol/L 104 104 104  CO2 22 -  32 mmol/L 30 30 27   Calcium 8.9 - 10.3 mg/dL 8.2(L) 8.1(L) 8.0(L)  Total Protein 6.5 - 8.1 g/dL - - -  Total Bilirubin 0.3 - 1.2 mg/dL - - -  Alkaline Phos 38 - 126 U/L - - -  AST 15 - 41 U/L - - -  ALT 0 - 44 U/L - - -     Ct Abdomen Pelvis Wo Contrast  Result Date: 01/15/2018 CLINICAL DATA:  Acute onset of gastroparesis. Nausea and vomiting. EXAM: CT ABDOMEN AND PELVIS WITHOUT CONTRAST TECHNIQUE: Multidetector CT imaging of the abdomen and pelvis was performed following the standard protocol without IV contrast. COMPARISON:  CT of the abdomen and pelvis performed 01/08/2018, and renal ultrasound performed 01/10/2018 FINDINGS: Lower chest: Mild bibasilar opacities likely reflect atelectasis. The visualized portions of the mediastinum are unremarkable. Hepatobiliary: There is mild soft tissue inflammation about the base of the gallbladder,  with adjacent nodes measuring up to 1.3 cm in short axis, raising concern for acute cholecystitis. The liver is grossly unremarkable in appearance. The common bile duct remains normal in caliber. Pancreas: The pancreas is within normal limits. Spleen: The spleen is unremarkable in appearance. Adrenals/Urinary Tract: The adrenal glands are unremarkable in appearance. Nonspecific perinephric stranding and fluid are noted bilaterally. The kidneys are otherwise unremarkable. There is no evidence of hydronephrosis. No renal or ureteral stones are identified. Stomach/Bowel: The stomach is unremarkable in appearance. The small bowel is within normal limits. The appendix is normal in caliber, without evidence of appendicitis. The colon is unremarkable in appearance. Vascular/Lymphatic: The abdominal aorta is unremarkable in appearance. The inferior vena cava is grossly unremarkable. No retroperitoneal lymphadenopathy is seen. No pelvic sidewall lymphadenopathy is identified. Reproductive: The bladder is moderately distended and grossly unremarkable. The prostate is borderline  normal in size. Other: Mild subcutaneous edema is noted along the anterior abdominal wall. Musculoskeletal: No acute osseous abnormalities are identified. Postoperative change is noted along the posterior right acetabulum and right ilium. The visualized musculature is unremarkable in appearance. IMPRESSION: 1. Mild soft tissue inflammation about the base of the gallbladder, with adjacent nodes measuring up to 1.3 cm in short axis, raising concern for acute cholecystitis. 2. Mild subcutaneous edema along the anterior abdominal wall. 3. Mild bibasilar airspace opacities likely reflect atelectasis. Electronically Signed   By: Roanna RaiderJeffery  Chang M.D.   On: 01/15/2018 04:30   Dg Chest Portable 1 View  Result Date: 01/15/2018 CLINICAL DATA:  Dyspnea and hypertension EXAM: PORTABLE CHEST 1 VIEW COMPARISON:  01/10/2018 FINDINGS: Low lung volumes with crowding of interstitial lung markings. Band like areas of opacity project over the lung bases likely representing areas of atelectasis. Foci of pneumonia are believed less likely given the bandlike appearance. Stable cardiomegaly is identified with minimal aortic atherosclerosis. IMPRESSION: Stable cardiomegaly with low lung volumes and bandlike opacities at the lung bases likely representing atelectasis. Electronically Signed   By: Tollie Ethavid  Kwon M.D.   On: 01/15/2018 02:03   [place] Abdomen Limited Ruq  Result Date: 01/15/2018 CLINICAL DATA:  Acute onset of upper abdominal pain. EXAM: ULTRASOUND ABDOMEN LIMITED RIGHT UPPER QUADRANT COMPARISON:  CT of the abdomen and pelvis performed earlier today at 4:12 a.m. FINDINGS: Gallbladder: The gallbladder wall is somewhat thickened, measuring 4 mm. There appears to be a wall echo shadow sign, reflecting stones filling the gallbladder. No ultrasonographic Murphy's sign is elicited. No pericholecystic fluid is seen. Common bile duct: Diameter: 0.3 cm, within normal limits in caliber. Liver: No focal lesion identified. Within normal limits  in parenchymal echogenicity. Portal vein is patent on color Doppler imaging with normal direction of blood flow towards the liver. IMPRESSION: Stones noted filling the gallbladder. Somewhat thickened gallbladder wall, without definite evidence of cholecystitis. The appearance on CT could reflect cholangitis; would correlate with the patient's symptoms. Electronically Signed   By: Roanna RaiderJeffery  Chang M.D.   On: 01/15/2018 06:02   Discharge Instructions: Discharge Instructions    Call MD for:  difficulty breathing, headache or visual disturbances   Complete by:  As directed    Call MD for:  extreme fatigue   Complete by:  As directed    Call MD for:  hives   Complete by:  As directed    Call MD for:  persistant dizziness or light-headedness   Complete by:  As directed    Call MD for:  persistant nausea and vomiting   Complete by:  As directed  Call MD for:  redness, tenderness, or signs of infection (pain, swelling, redness, odor or green/yellow discharge around incision site)   Complete by:  As directed    Call MD for:  severe uncontrolled pain   Complete by:  As directed    Call MD for:  temperature >100.4   Complete by:  As directed    Diet - low sodium heart healthy   Complete by:  As directed    Discharge instructions   Complete by:  As directed    Mr. Lusby,  Please note the following changes to your medications:  Please take Augmentin 500 mg for ten days and follow up with surgery outpatient. Please take Lasix 80 BID and closely follow up with your PCP. Please increase hydralazine to 50 mg TID and follow up with your PCP.  Thank you for letting us be a part of your care!   Increase activity slowly   Complete by:  As directed       Signed: Jaci Standard, DO 01/24/2018, 11:44 AM   Pager: [phone]

## 2018-01-23 NOTE — Progress Notes (Signed)
4 Days Post-Op   Subjective/Chief Complaint: Denies pain.  Has been having increasing creatinine.  Urinating well.     Objective: Vital signs in last 24 hours: Temp:  [97.3 F (36.3 C)-97.7 F (36.5 C)] 97.7 F (36.5 C) (08/18 0700) Pulse Rate:  [59-63] 59 (08/18 0822) BP: (104-150)/(54-88) 135/76 (08/18 0822) SpO2:  [83 %-100 %] 99 % (08/18 0824) Weight:  [150.6 kg-152.5 kg] 150.6 kg (08/18 0300) Last BM Date: 01/20/18  Intake/Output from previous day: 08/17 0701 - 08/18 0700 In: 560.2 [P.O.:240; I.V.:3; IV Piggyback:317.2] Out: 2625 [Urine:2625] Intake/Output this shift: No intake/output data recorded.  General appearance: cooperative Resp: clear to auscultation bilaterally Cardio: regular rate and rhythm GI: soft, NO RUQ tenderness  Lab Results:  No results for input(s): WBC, HGB, HCT, PLT in the last 72 hours. BMET Recent Labs    01/22/18 0238 01/23/18 0256  NA 140 141  K 3.6 3.0*  CL 104 104  CO2 27 30  GLUCOSE 240* 157*  BUN 63* 55*  CREATININE 5.23* 5.05*  CALCIUM 8.0* 8.1*   PT/INR No results for input(s): LABPROT, INR in the last 72 hours. ABG No results for input(s): PHART, HCO3 in the last 72 hours.  Invalid input(s): PCO2, PO2  Studies/Results: [place] Renal  Result Date: 01/22/2018 CLINICAL DATA:  47 year old male with acute kidney injury. EXAM: RENAL / URINARY TRACT ULTRASOUND COMPLETE COMPARISON:  01/10/2018 renal ultrasound. 01/15/2018 CT and prior studies FINDINGS: Right Kidney: Length: 12.3 cm. Echogenicity within normal limits. No mass or hydronephrosis visualized. Left Kidney: Length: 14.1 cm. Echogenicity within normal limits. No mass or hydronephrosis visualized. Bladder: Appears normal for degree of bladder distention. IMPRESSION: Normal renal ultrasound. Electronically Signed   By: Harmon PierJeffrey  Hu M.D.   On: 01/22/2018 13:28    Anti-infectives: Anti-infectives (From admission, onward)   Start     Dose/Rate Route Frequency Ordered Stop   01/22/18 1230  amoxicillin-clavulanate (AUGMENTIN) 875-125 MG per tablet 1 tablet  Status:  Discontinued     1 tablet Oral Every 12 hours 01/22/18 1218 01/22/18 1236   01/15/18 1200  piperacillin-tazobactam (ZOSYN) IVPB 3.375 g  Status:  Discontinued     3.375 g 12.5 mL/hr over 240 Minutes Intravenous Every 8 hours 01/15/18 0833 01/22/18 1218   01/15/18 0530  piperacillin-tazobactam (ZOSYN) IVPB 3.375 g     3.375 g 100 mL/hr over 30 Minutes Intravenous  Once 01/15/18 0517 01/15/18 0720      Assessment/Plan: Likely chronic cholecystitis, choledocholithiasis, s/p ERCP -successful ERCP -unsuccessful placement of a perc chole drain in IR due to body habitus and underdistended gallbladder -Patient continuing to improve.   Outpatient cholecystectomy in several weeks after his acute problems improve and it allows his gallbladder to settle down as well.  He is very high risk for surgical intervention now secondary to risk for complications etc.  He will need an additional 10 days of oral abx therapy to go home on.  Our office is going to be contacted to set up his surgery and will contact him. He will be followed by Dr. Dwain SarnaWakefield at [facility]. -cont carb mod/heart healthy diet.  Acute on chronic renal failure Defer to medicine. Creatinine back down.  D/c per medicine.    Surgical issues stable.    FEN - carb mod/heart healthy diet VTE - TED hose ID - oral augmentin.  Will need 8 additional days.  LOS: 8 days    Almond LintFaera Omolola Mittman 01/23/2018

## 2018-01-24 ENCOUNTER — Other Ambulatory Visit: Payer: Self-pay | Admitting: Internal Medicine

## 2018-01-24 DIAGNOSIS — I5033 Acute on chronic diastolic (congestive) heart failure: Secondary | ICD-10-CM

## 2018-01-24 LAB — GLUCOSE, CAPILLARY: Glucose-Capillary: 219 mg/dL — ABNORMAL HIGH (ref 70–99)

## 2018-01-24 LAB — BASIC METABOLIC PANEL
Anion gap: 8 (ref 5–15)
BUN: 45 mg/dL — AB (ref 6–20)
CO2: 30 mmol/L (ref 22–32)
CREATININE: 3.88 mg/dL — AB (ref 0.61–1.24)
Calcium: 8.2 mg/dL — ABNORMAL LOW (ref 8.9–10.3)
Chloride: 104 mmol/L (ref 98–111)
GFR calc Af Amer: 19 mL/min — ABNORMAL LOW (ref >60)
GFR calc non Af Amer: 17 mL/min — ABNORMAL LOW (ref >60)
Glucose, Bld: 196 mg/dL — ABNORMAL HIGH (ref 70–99)
Potassium: 3.4 mmol/L — ABNORMAL LOW (ref 3.5–5.1)
SODIUM: 142 mmol/L (ref 135–145)

## 2018-01-24 LAB — MAGNESIUM: MAGNESIUM: 1.8 mg/dL (ref 1.7–2.4)

## 2018-01-24 MED ORDER — AMOXICILLIN-POT CLAVULANATE 500-125 MG PO TABS: 1.0000 | ORAL_TABLET | Freq: Two times a day (BID) | ORAL | 0 refills | Status: AC

## 2018-01-24 MED ORDER — POTASSIUM CHLORIDE CRYS ER 20 MEQ PO TBCR: 40.0000 meq | EXTENDED_RELEASE_TABLET | Freq: Once | ORAL | Status: DC

## 2018-01-24 MED ORDER — HYDRALAZINE HCL 50 MG PO TABS: 50.0000 mg | ORAL_TABLET | Freq: Three times a day (TID) | ORAL | 0 refills | Status: AC

## 2018-01-24 MED ORDER — FUROSEMIDE 80 MG PO TABS: 80.0000 mg | ORAL_TABLET | Freq: Two times a day (BID) | ORAL | 0 refills | Status: DC

## 2018-01-24 NOTE — Progress Notes (Signed)
Upon entering room earlier patient upset because no discharge orders written at 0820. Multiple calls sent to resident with return call back later and discharge orders written. Patient discharged home with wife after refusing  am meds  because wife had to get to school. Patient verberalized understanding when next dose of meds due.

## 2018-01-24 NOTE — Progress Notes (Signed)
Internal Medicine Attending  Date: 01/24/2018  Patient name: Kenneth Fox Medical record number: [id number] Date of birth: 10/04/1970 Age: 47 y.o. Gender: male  I saw and evaluated the patient. I reviewed the resident's note by Dr. Karilyn Cotaehman and I agree with the resident's findings and plans as documented in her progress note.  When seen on rounds this morning [name] was feeling well without any abdominal pain. His creatinine improved this morning and he stable for discharge home on 10 days of antibiotics and follow-up with his primary care provider and elective cholecystectomy in approximately 3 weeks.

## 2018-01-24 NOTE — Progress Notes (Signed)
   Subjective: [name] was seen sitting upright with no [place] again. He denied any abdominal pain, SOB, nausea, vomiting, fever or chills. He reported feeling back to his baseline. He was excited to hear that his creatine function had improved and that he would be discharged today. He was informed to continue taking an antibiotic for ten days per surgery and to closely follow up with them and his PCP.   Objective:  Vital signs in last 24 hours: Vitals:   01/24/18 0045 01/24/18 0458 01/24/18 0500 01/24/18 0700  BP: 127/62 124/68    Pulse: 61 66    Resp: 18 18    Temp: 98.3 F (36.8 C) 98.8 F (37.1 C)  (!) 97.2 F (36.2 C)  TempSrc: Oral Oral    SpO2: 96% 99%    Weight:   (!) 147.6 kg   Height:       Physical Exam  Constitutional: He is oriented to person, place, and time and well-developed, well-nourished, and in no distress.  Cardiovascular: Normal rate, regular rhythm and normal heart sounds.  No murmur heard. Pulmonary/Chest: Effort normal. No respiratory distress. He has no wheezes. He has no rales.  Abdominal: Soft. Bowel sounds are normal. He exhibits no distension. There is no tenderness. There is no guarding.  Musculoskeletal: He exhibits edema.  Neurological: He is alert and oriented to person, place, and time.  Skin: Skin is warm and dry.    Assessment/Plan:  Principal Problem:   Choledocholithiasis with acute cholecystitis with obstruction Active Problems:   Essential hypertension   OSA on CPAP   Acute on chronic renal failure (HCC)   Diabetes mellitus type 2, insulin dependent (HCC)   Acute on chronic diastolic CHF (congestive heart failure) (HCC)   Acute cholecystitis   Oxygen dependent   Cholangitis due to bile duct calculus with obstruction   Kenneth Fox is a 47 year old male with a PMHx of diastolic CHF, obstructive sleep apnea, HTN, Type II DM, gastroparesis and morbid obesity presenting with abdominal pain, elevated LFT's and imaging suggestive of  acute cholecystitisand choledocholithiasis.Underwent ERCP on 8/14 that illustrated choledocholithiasis with acute cholangitis. Renal function improved today with aggressive diuretic therapy.  Acute cholecystitis with choledocholithiasis, s/p ERCP: Clinically improved, denies any abdominal pain. Will discharge on augmentin x10 days and close follow up with surgery. 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 He still has some +1 pitting edema on exam. He will receive one more dose of furosemide 120 mg IV before discharge today. Discharging him with 80 mg furosemide BID and close follow up with PCP. -fluid restriction -continue furosemide 80 mg BID   AKI on CKD: Cr 3.88 today, baseline 2.8. K+ 3.4 and repleted with 40 mEq x1.  -see diuretic therapy above  HTN: 124/68.Stable -continue clonidine 0.2 mg oral BID -discharging on hydralazine 50 mg TID -continue metoprolol 25 mg BID  OSA and obesity hypoventilation syndrome Chronic hypoxic respiratory failure:  -continue 3L [place] -CPAP qhs  Type II DM: CBG 199 -Levemir 10 units, novolog 10 units with SSI -Lyrica 100 mg TID PRN  -Reglan 10 mg IV x1 -continue lyrica 100 mg TID   Dispo: Patient is medically stable for discharge today.  Katlin Bortner N, DO 01/24/2018, 11:22 AM Pager: [phone]

## 2018-01-25 ENCOUNTER — Telehealth (HOSPITAL_COMMUNITY): Payer: Self-pay | Admitting: *Deleted

## 2018-01-25 LAB — AEROBIC/ANAEROBIC CULTURE (SURGICAL/DEEP WOUND): CULTURE: NO GROWTH

## 2018-01-25 LAB — AEROBIC/ANAEROBIC CULTURE W GRAM STAIN (SURGICAL/DEEP WOUND)
Gram Stain: NONE SEEN
Special Requests: NORMAL

## 2018-01-25 NOTE — Telephone Encounter (Signed)
[facility]HRN called to report pt's ReDS vest reading was 39% today and she is starting the diuretic protocol.  Pt is not followed in our clinic by our providers but has the vest protocol so we manage diuretics only.

## 2018-02-01 ENCOUNTER — Other Ambulatory Visit: Payer: Self-pay | Admitting: General Surgery

## 2018-02-02 ENCOUNTER — Telehealth (HOSPITAL_COMMUNITY): Payer: Self-pay | Admitting: *Deleted

## 2018-02-02 NOTE — Telephone Encounter (Signed)
Dr Gala RomneyBensimhon aware of all above and reviewed labs drawn on 8/27, K 3.3, he advised pt just take the 40 meq today.  Per note pt has done this already.

## 2018-02-02 NOTE — Telephone Encounter (Signed)
Tasha RN with [facility] called to report patients reds vest readings first reading was 45% second reading was 47%.  Patient is asymptomatic no increased edema or shortness of breath. Patients weight is up 4lbs in 1 week. Rodney Boozeasha stated patient has orders for the diurectic protocol and per the protocol they should initiate step one which is to double lasix, take 2.5mg  of metolazone and administer 40meq of K. Patient told Rodney Boozeasha he already doubled his morning Lasix and did not want to take Metolazone he was going to double his evening Lasix and take 40meq of Potassium. Rodney Boozeasha noticed patient does not take daily K and wanted to know if he should. I looked at patients last labs and his K is low. I told her I would talk to Dr.Bensimhon and then call her with any orders. Rodney Boozeasha will see the patient again tomorrow.

## 2018-02-03 ENCOUNTER — Telehealth (HOSPITAL_COMMUNITY): Payer: Self-pay

## 2018-02-03 NOTE — Telephone Encounter (Signed)
Dr. Derenda FennelBensinhom spoke with Marcelle SmilingNatasha RN and informed her that vest reading should not come the CHF clinic and pt should follow up Dr. [place]acy [place] for further treatment.

## 2018-02-04 ENCOUNTER — Telehealth (HOSPITAL_COMMUNITY): Payer: Self-pay

## 2018-02-04 NOTE — Telephone Encounter (Signed)
[facility] called to state pt's blood sugar was 34. I advised her that pt has never been seen here and to follow up with his PCP.

## 2018-02-06 ENCOUNTER — Other Ambulatory Visit: Payer: Self-pay | Admitting: Internal Medicine

## 2018-02-09 ENCOUNTER — Other Ambulatory Visit (HOSPITAL_COMMUNITY): Payer: Self-pay | Admitting: Internal Medicine

## 2018-02-14 ENCOUNTER — Encounter (HOSPITAL_COMMUNITY): Payer: Medicare Other

## 2018-02-24 NOTE — Pre-Procedure Instructions (Signed)
Kenneth Fox  02/24/2018      [facility] [facility] [address] - [place], [place] - [address] [facility] [place] [address] Phone: [phone] Fax: [phone]  [id number] [place], [place] - [address] AT [facility] OF [address] [place] [address] Phone: [phone] Fax: [phone]    Your procedure is scheduled on March 04, 2018.  Report to [facility] Admitting at 800 AM.  Call this number if you have problems the morning of surgery:  [phone]   Remember:  Do not eat or drink after midnight.  You may drink clear liquids until 700 AM-3 hours prior to procedure start time.  Clear liquids allowed are:    Ensure pre-surgery drink,    Water, Juice (non-citric and without pulp), Carbonated beverages, Clear Tea, Black Coffee only, Plain Jell-O only, Gatorade and Plain Popsicles only   Please complete your PRE-SURGERY ENSURE that was given to you by 700 AM the morning of surgery.  Please, if able, drink it in one setting. DO NOT SIP.    Take these medicines the morning of surgery with A SIP OF WATER  Ventolin inhaler-if needed-bring inhaler with you Oxycodone-if needed for pain Methocarbamol-if needed for spasms lyrica Clonidine (catapres) Tylenol-if needed  7 days prior to surgery STOP taking any Aspirin (unless otherwise instructed by your surgeon), Aleve, Naproxen, Ibuprofen, Motrin, Advil, Goody's, BC's, all herbal medications, fish oil, and all vitamins   WHAT DO I DO ABOUT MY DIABETES MEDICATION?  [name] Do not take oral diabetes medicines (pills) the morning of surgery.  . THE NIGHT BEFORE SURGERY, take your normal dose of novolog insulin with dinner and 20 units of levimir insulin before bed (1/2 of your normal dose).     . THE MORNING OF SURGERY, take 20 units of levimir insulin (1/2 of your normal dose) and no Novolog insulin unless your CBG is greater  than 220 mg/dL, then you may take  of your sliding scale (correction) dose of insulin.  . The day of surgery, do not take other diabetes injectables, including Byetta (exenatide), Bydureon (exenatide ER), Victoza (liraglutide), or Trulicity (dulaglutide).  . If your CBG is greater than 220 mg/dL, you may take  of your sliding scale (correction) dose of insulin.  Reviewed and Endorsed by [facility] Patient Education Committee, August 2015   How to Manage Your Diabetes Before and After Surgery  Why is it important to control my blood sugar before and after surgery? . Improving blood sugar levels before and after surgery helps healing and can limit problems. . A way of improving blood sugar control is eating a healthy diet by: o  Eating less sugar and carbohydrates o  Increasing activity/exercise o  Talking with your doctor about reaching your blood sugar goals . High blood sugars (greater than 180 mg/dL) can raise your risk of infections and slow your recovery, so you will need to focus on controlling your diabetes during the weeks before surgery. . Make sure that the doctor who takes care of your diabetes knows about your planned surgery including the date and location.  How do I manage my blood sugar before surgery? . Check your blood sugar at least 4 times a day, starting 2 days before surgery, to make sure that the level is not too high or low. o Check your blood sugar the morning of your surgery when you wake up  and every 2 hours until you get to the Short Stay unit. . If your blood sugar is less than 70 mg/dL, you will need to treat for low blood sugar: o Do not take insulin. o Treat a low blood sugar (less than 70 mg/dL) with  cup of clear juice (cranberry or apple), 4 glucose tablets, OR glucose gel. Recheck blood sugar in 15 minutes after treatment (to make sure it is greater than 70 mg/dL). If your blood sugar is not greater than 70 mg/dL on recheck, call [phone] o  for  further instructions. . Report your blood sugar to the short stay nurse when you get to Short Stay.  . If you are admitted to the hospital after surgery: o Your blood sugar will be checked by the staff and you will probably be given insulin after surgery (instead of oral diabetes medicines) to make sure you have good blood sugar levels. o The goal for blood sugar control after surgery is 80-180 mg/dL.    Do not wear jewelry  Do not wear lotions, powders, or colognes, or deodorant.  Men may shave face and neck.  Do not bring valuables to the hospital.  [facility] is not responsible for any belongings or valuables.  Contacts, dentures or bridgework may not be worn into surgery.  Leave your suitcase in the car.  After surgery it may be brought to your room.  For patients admitted to the hospital, discharge time will be determined by your treatment team.  Patients discharged the day of surgery will not be allowed to drive home.    Kenneth Fox- Preparing For Surgery  Before surgery, you can play an important role. Because skin is not sterile, your skin needs to be as free of germs as possible. You can reduce the number of germs on your skin by washing with CHG (chlorahexidine gluconate) Soap before surgery.  CHG is an antiseptic cleaner which kills germs and bonds with the skin to continue killing germs even after washing.    Oral Hygiene is also important to reduce your risk of infection.  Remember - BRUSH YOUR TEETH THE MORNING OF SURGERY WITH YOUR REGULAR TOOTHPASTE  Please do not use if you have an allergy to CHG or antibacterial soaps. If your skin becomes reddened/irritated stop using the CHG.  Do not shave (including legs and underarms) for at least 48 hours prior to first CHG shower. It is OK to shave your face.  Please follow these instructions carefully.   1. Shower the NIGHT BEFORE SURGERY and the MORNING OF SURGERY with CHG.   2. If you chose to wash your hair, wash your  hair first as usual with your normal shampoo.  3. After you shampoo, rinse your hair and body thoroughly to remove the shampoo.  4. Use CHG as you would any other liquid soap. You can apply CHG directly to the skin and wash gently with a scrungie or a clean washcloth.   5. Apply the CHG Soap to your body ONLY FROM THE NECK DOWN.  Do not use on open wounds or open sores. Avoid contact with your eyes, ears, mouth and genitals (private parts). Wash Face and genitals (private parts)  with your normal soap.  6. Wash thoroughly, paying special attention to the area where your surgery will be performed.  7. Thoroughly rinse your body with warm water from the neck down.  8. DO NOT shower/wash with your normal soap after using and rinsing off the CHG Soap.  9. Pat yourself dry with a CLEAN TOWEL.  10. Wear CLEAN PAJAMAS to bed the night before surgery, wear comfortable clothes the morning of surgery  11. Place CLEAN SHEETS on your bed the night of your first shower and DO NOT SLEEP WITH PETS.  Day of Surgery:  Do not apply any deodorants/lotions.  Please wear clean clothes to the hospital/surgery center.   Remember to brush your teeth WITH YOUR REGULAR TOOTHPASTE.  Please read over the following fact sheets that you were given. Pain Booklet, Coughing and Deep Breathing and Surgical Site Infection Prevention

## 2018-02-25 ENCOUNTER — Encounter (HOSPITAL_COMMUNITY)
Admission: RE | Admit: 2018-02-25 | Discharge: 2018-02-25 | Disposition: A | Discharge status: Home / Self Care | Payer: Medicare Other | Source: Ambulatory Visit | Attending: General Surgery | Admitting: General Surgery

## 2018-02-25 ENCOUNTER — Encounter (HOSPITAL_COMMUNITY): Payer: Self-pay | Admitting: Physician Assistant

## 2018-02-25 ENCOUNTER — Encounter (HOSPITAL_COMMUNITY): Payer: Self-pay

## 2018-02-25 ENCOUNTER — Encounter (HOSPITAL_COMMUNITY): Payer: Self-pay | Admitting: Emergency Medicine

## 2018-02-25 ENCOUNTER — Inpatient Hospital Stay (HOSPITAL_COMMUNITY)
Admission: EM | Admit: 2018-02-25 | Discharge: 2018-03-05 | DRG: 444 | Disposition: A | Discharge status: Home Health | Payer: Medicare Other | Attending: Student in an Organized Health Care Education/Training Program | Admitting: Student in an Organized Health Care Education/Training Program

## 2018-02-25 ENCOUNTER — Other Ambulatory Visit: Payer: Self-pay

## 2018-02-25 DIAGNOSIS — D631 Anemia in chronic kidney disease: Secondary | ICD-10-CM | POA: Diagnosis present

## 2018-02-25 DIAGNOSIS — D573 Sickle-cell trait: Secondary | ICD-10-CM | POA: Diagnosis present

## 2018-02-25 DIAGNOSIS — E1122 Type 2 diabetes mellitus with diabetic chronic kidney disease: Secondary | ICD-10-CM | POA: Diagnosis present

## 2018-02-25 DIAGNOSIS — J9621 Acute and chronic respiratory failure with hypoxia: Secondary | ICD-10-CM | POA: Diagnosis present

## 2018-02-25 DIAGNOSIS — Z9981 Dependence on supplemental oxygen: Secondary | ICD-10-CM

## 2018-02-25 DIAGNOSIS — J9622 Acute and chronic respiratory failure with hypercapnia: Secondary | ICD-10-CM | POA: Diagnosis present

## 2018-02-25 DIAGNOSIS — E785 Hyperlipidemia, unspecified: Secondary | ICD-10-CM | POA: Diagnosis present

## 2018-02-25 DIAGNOSIS — Z833 Family history of diabetes mellitus: Secondary | ICD-10-CM

## 2018-02-25 DIAGNOSIS — E1121 Type 2 diabetes mellitus with diabetic nephropathy: Secondary | ICD-10-CM | POA: Diagnosis present

## 2018-02-25 DIAGNOSIS — D509 Iron deficiency anemia, unspecified: Secondary | ICD-10-CM | POA: Diagnosis present

## 2018-02-25 DIAGNOSIS — E872 Acidosis: Secondary | ICD-10-CM | POA: Diagnosis present

## 2018-02-25 DIAGNOSIS — Z888 Allergy status to other drugs, medicaments and biological substances status: Secondary | ICD-10-CM

## 2018-02-25 DIAGNOSIS — Z832 Family history of diseases of the blood and blood-forming organs and certain disorders involving the immune mechanism: Secondary | ICD-10-CM

## 2018-02-25 DIAGNOSIS — I5033 Acute on chronic diastolic (congestive) heart failure: Secondary | ICD-10-CM | POA: Diagnosis present

## 2018-02-25 DIAGNOSIS — N184 Chronic kidney disease, stage 4 (severe): Secondary | ICD-10-CM

## 2018-02-25 DIAGNOSIS — Z79891 Long term (current) use of opiate analgesic: Secondary | ICD-10-CM

## 2018-02-25 DIAGNOSIS — K819 Cholecystitis, unspecified: Secondary | ICD-10-CM | POA: Diagnosis not present

## 2018-02-25 DIAGNOSIS — Z841 Family history of disorders of kidney and ureter: Secondary | ICD-10-CM

## 2018-02-25 DIAGNOSIS — E876 Hypokalemia: Secondary | ICD-10-CM | POA: Diagnosis not present

## 2018-02-25 DIAGNOSIS — E662 Morbid (severe) obesity with alveolar hypoventilation: Secondary | ICD-10-CM | POA: Diagnosis present

## 2018-02-25 DIAGNOSIS — K429 Umbilical hernia without obstruction or gangrene: Secondary | ICD-10-CM | POA: Diagnosis present

## 2018-02-25 DIAGNOSIS — N189 Chronic kidney disease, unspecified: Secondary | ICD-10-CM

## 2018-02-25 DIAGNOSIS — I2729 Other secondary pulmonary hypertension: Secondary | ICD-10-CM | POA: Diagnosis present

## 2018-02-25 DIAGNOSIS — K8066 Calculus of gallbladder and bile duct with acute and chronic cholecystitis without obstruction: Principal | ICD-10-CM | POA: Diagnosis present

## 2018-02-25 DIAGNOSIS — J45909 Unspecified asthma, uncomplicated: Secondary | ICD-10-CM | POA: Diagnosis present

## 2018-02-25 DIAGNOSIS — Z794 Long term (current) use of insulin: Secondary | ICD-10-CM

## 2018-02-25 DIAGNOSIS — I5030 Unspecified diastolic (congestive) heart failure: Secondary | ICD-10-CM

## 2018-02-25 DIAGNOSIS — J9811 Atelectasis: Secondary | ICD-10-CM | POA: Diagnosis present

## 2018-02-25 DIAGNOSIS — Z6841 Body Mass Index (BMI) 40.0 and over, adult: Secondary | ICD-10-CM

## 2018-02-25 DIAGNOSIS — Z79899 Other long term (current) drug therapy: Secondary | ICD-10-CM

## 2018-02-25 DIAGNOSIS — N179 Acute kidney failure, unspecified: Secondary | ICD-10-CM | POA: Diagnosis present

## 2018-02-25 DIAGNOSIS — Z9119 Patient's noncompliance with other medical treatment and regimen: Secondary | ICD-10-CM

## 2018-02-25 DIAGNOSIS — K219 Gastro-esophageal reflux disease without esophagitis: Secondary | ICD-10-CM | POA: Diagnosis present

## 2018-02-25 DIAGNOSIS — I132 Hypertensive heart and chronic kidney disease with heart failure and with stage 5 chronic kidney disease, or end stage renal disease: Secondary | ICD-10-CM | POA: Diagnosis present

## 2018-02-25 DIAGNOSIS — Z8249 Family history of ischemic heart disease and other diseases of the circulatory system: Secondary | ICD-10-CM

## 2018-02-25 LAB — CBC WITH DIFFERENTIAL/PLATELET
Abs Immature Granulocytes: 0 10*3/uL (ref 0.0–0.1)
BASOS ABS: 0.1 10*3/uL (ref 0.0–0.1)
Basophils Relative: 1 %
EOS ABS: 0.2 10*3/uL (ref 0.0–0.7)
EOS PCT: 2 %
HCT: 32.6 % — ABNORMAL LOW (ref 39.0–52.0)
Hemoglobin: 9.8 g/dL — ABNORMAL LOW (ref 13.0–17.0)
Immature Granulocytes: 0 %
Lymphocytes Relative: 16 %
Lymphs Abs: 1.6 10*3/uL (ref 0.7–4.0)
MCH: 24 pg — ABNORMAL LOW (ref 26.0–34.0)
MCHC: 30.1 g/dL (ref 30.0–36.0)
MCV: 79.7 fL (ref 78.0–100.0)
Monocytes Absolute: 0.7 10*3/uL (ref 0.1–1.0)
Monocytes Relative: 8 %
NEUTROS PCT: 73 %
Neutro Abs: 7 10*3/uL (ref 1.7–7.7)
PLATELETS: 367 10*3/uL (ref 150–400)
RBC: 4.09 MIL/uL — AB (ref 4.22–5.81)
RDW: 16.2 % — AB (ref 11.5–15.5)
WBC: 9.6 10*3/uL (ref 4.0–10.5)

## 2018-02-25 LAB — CBC
HEMATOCRIT: 32.3 % — AB (ref 39.0–52.0)
Hemoglobin: 9.9 g/dL — ABNORMAL LOW (ref 13.0–17.0)
MCH: 24.2 pg — AB (ref 26.0–34.0)
MCHC: 30.7 g/dL (ref 30.0–36.0)
MCV: 79 fL (ref 78.0–100.0)
PLATELETS: 363 10*3/uL (ref 150–400)
RBC: 4.09 MIL/uL — ABNORMAL LOW (ref 4.22–5.81)
RDW: 16.2 % — AB (ref 11.5–15.5)
WBC: 8.6 10*3/uL (ref 4.0–10.5)

## 2018-02-25 LAB — COMPREHENSIVE METABOLIC PANEL
ALBUMIN: 2.7 g/dL — AB (ref 3.5–5.0)
ALT: 16 U/L (ref 0–44)
ANION GAP: 13 (ref 5–15)
AST: 17 U/L (ref 15–41)
Alkaline Phosphatase: 106 U/L (ref 38–126)
BUN: 37 mg/dL — ABNORMAL HIGH (ref 6–20)
CHLORIDE: 103 mmol/L (ref 98–111)
CO2: 25 mmol/L (ref 22–32)
Calcium: 8.9 mg/dL (ref 8.9–10.3)
Creatinine, Ser: 3.35 mg/dL — ABNORMAL HIGH (ref 0.61–1.24)
GFR calc Af Amer: 23 mL/min — ABNORMAL LOW (ref >60)
GFR calc non Af Amer: 20 mL/min — ABNORMAL LOW (ref >60)
GLUCOSE: 167 mg/dL — AB (ref 70–99)
POTASSIUM: 3.9 mmol/L (ref 3.5–5.1)
SODIUM: 141 mmol/L (ref 135–145)
TOTAL PROTEIN: 6.9 g/dL (ref 6.5–8.1)
Total Bilirubin: 0.8 mg/dL (ref 0.3–1.2)

## 2018-02-25 LAB — HEMOGLOBIN A1C
Hgb A1c MFr Bld: 8.1 % — ABNORMAL HIGH (ref 4.8–5.6)
Mean Plasma Glucose: 185.77 mg/dL

## 2018-02-25 LAB — GLUCOSE, CAPILLARY: GLUCOSE-CAPILLARY: 175 mg/dL — AB (ref 70–99)

## 2018-02-25 MED ORDER — OXYCODONE-ACETAMINOPHEN 5-325 MG PO TABS
1.0000 | ORAL_TABLET | ORAL | Status: DC | PRN
  Administered 2018-02-25: 1 via ORAL
  Filled 2018-02-25: qty 1

## 2018-02-25 MED ORDER — ONDANSETRON 4 MG PO TBDP
4.0000 mg | ORAL_TABLET | Freq: Once | ORAL | Status: AC
  Administered 2018-02-25: 4 mg via ORAL
  Filled 2018-02-25: qty 1

## 2018-02-25 NOTE — Progress Notes (Signed)
Anesthesia Chart Review:  Case:  [id number] Date/Time:  03/04/18 0945   Procedure:  LAPAROSCOPIC CHOLECYSTECTOMY WITH INTRAOPERATIVE CHOLANGIOGRAM (N/A )   Anesthesia type:  General   Pre-op diagnosis:  GALLSTONES   Location:  [facility] ROOM 07 / [facility] OR   Surgeon:  Emelia LoronWakefield, Matthew, MD      DISCUSSION: [age] yo male never smoker for above procedure. Pertinent hx includes IDDM, GERD, CKD, OSA on CPAP, CHF, HTN, Gastroparesis, Sickle cell trait.  Pt admitted 8/5-01/13/18 for AKI on CKD. He was recently admitted in [place] for CHF and switched from lasix to bumex; however, continued taking both. Creatinine trended down with IV fluids. Pt  Troponin 0.82 on admission, trended down to 0.60 and 0.53. EKG was unchanged from priors and he denied chest pain, felt to be due to demand ischemia.   Admitted again 8/9-8/19/2019 for choledocholithiasis with cholangitis, AKI on CKD. Underwent ERCP on 8/14 that illustrated choledocholithiasis with acute cholangitis. Renal function improved with diuresis. While admitted he had an Echo showing EF 55-60%, normal wall motion, mild LVH, mild RVE with RV dysfunction. He was discharged to f/u with surgery for outpt cholecystectomy.   Multiple recent HF exacerbations and difficulty with fluid management. Will need DOS eval by anesthesiologist.   Rica MoteADDENDUM 03/02/2018. Above procedure has been cancelled as pt currently admitted for CHF and CKD.  VS: BP (!) 154/84   Pulse 85   Temp 36.6 C   Resp 20   Ht 5\' 11"  (1.803 m)   Wt 102.6 kg   SpO2 95%   BMI 31.53 kg/m   PROVIDERS: Long, [name], PA-C is PCP  Florian BuffVyas, Chandra, MD is Cardiologist  LABS: Anemia with Hgb 9.8. Pt with known CKD baseline creatinine reportedly ~2.6. Elevated creatinine, however trending down from recent AKI. Creatinine, Ser (mg/dL)  Date Value  04/54/098109/20/2019 3.35 (H)  01/24/2018 3.88 (H)  01/23/2018 5.05 (H)  01/22/2018 5.23 (H)  01/21/2018 4.71 (H)  01/20/2018 3.53 (H)    (all labs ordered are  listed, but only abnormal results are displayed)  Labs Reviewed  GLUCOSE, CAPILLARY - Abnormal; Notable for the following components:      Result Value   Glucose-Capillary 175 (*)    All other components within normal limits  CBC WITH DIFFERENTIAL/PLATELET - Abnormal; Notable for the following components:   RBC 4.09 (*)    Hemoglobin 9.8 (*)    HCT 32.6 (*)    MCH 24.0 (*)    RDW 16.2 (*)    All other components within normal limits  COMPREHENSIVE METABOLIC PANEL - Abnormal; Notable for the following components:   Glucose, Bld 167 (*)    BUN 37 (*)    Creatinine, Ser 3.35 (*)    Albumin 2.7 (*)    GFR calc non Af Amer 20 (*)    GFR calc Af Amer 23 (*)    All other components within normal limits  HEMOGLOBIN A1C - Abnormal; Notable for the following components:   Hgb A1c MFr Bld 8.1 (*)    All other components within normal limits     IMAGES:   EKG:   CV:  Past Medical History:  Diagnosis Date  . Asthma   . CHF (congestive heart failure) (HCC)   . Diabetes (HCC)   . Elevated liver function tests   . Gastroparesis   . GERD (gastroesophageal reflux disease)   . Hypertension   . Kidney disorder   . Neuropathy   . Obesity   . Obstructive sleep  apnea    uses CPAP  . Renal insufficiency   . Sickle cell trait [facility])     Past Surgical History:  Procedure Laterality Date  . ENDOSCOPIC RETROGRADE CHOLANGIOPANCREATOGRAPHY (ERCP) WITH PROPOFOL N/A 01/19/2018   Procedure: ENDOSCOPIC RETROGRADE CHOLANGIOPANCREATOGRAPHY (ERCP) WITH PROPOFOL;  Surgeon: Meridee Score Netty Starring., MD;  Location: [facility] ENDOSCOPY;  Service: Gastroenterology;  Laterality: N/A;  . HIP FRACTURE SURGERY Right 1999   "put a plate in"  . REMOVAL OF STONES  01/19/2018   Procedure: REMOVAL OF STONES;  Surgeon: Meridee Score Netty Starring., MD;  Location: [facility] ENDOSCOPY;  Service: Gastroenterology;;  . Dennison Mascot  01/19/2018   Procedure: Dennison Mascot;  Surgeon: Mansouraty, Netty Starring., MD;  Location: [facility]  ENDOSCOPY;  Service: Gastroenterology;;    MEDICATIONS: . acetaminophen (TYLENOL) 500 MG tablet  . cloNIDine (CATAPRES) 0.2 MG tablet  . furosemide (LASIX) 40 MG tablet  . furosemide (LASIX) 80 MG tablet  . hydrALAZINE (APRESOLINE) 50 MG tablet  . Iron-FA-B Cmp-C-Biot-Probiotic (FUSION PLUS PO)  . LEVEMIR FLEXTOUCH 100 UNIT/ML Pen  . LYRICA 150 MG capsule  . methocarbamol (ROBAXIN) 500 MG tablet  . metoCLOPramide (REGLAN) 10 MG tablet  . NOVOLOG FLEXPEN 100 UNIT/ML FlexPen  . Oxycodone HCl 10 MG TABS  . Potassium Chloride ER 20 MEQ TBCR  . VENTOLIN HFA 108 (90 Base) MCG/ACT inhaler   No current facility-administered medications for this encounter.      Zannie Cove [facility] Short Stay Center/Anesthesiology Phone [phone] 03/02/2018 1:03 PM

## 2018-02-25 NOTE — Progress Notes (Addendum)
PCP - [name] Long- requested last office note Cardiologist - Dr. Sherril CroonVyas- requested last office note Pt has recent heart tests [place] in care everywhere where he was admitted for HF fluid overload in July. [facility] and fluid overload per pt. Pt states he is on fluid restrictions and currently feels well.   Chest x-ray - 01/08/18 EKG -  01/23/18 Stress Test -denies ECHO - 01/15/18 Cardiac Cath - denies  Sleep Study - 9/15 uses CPAP, told to bring mask and tubing DOS  Fasting Blood Sugar - 120-200s prior to taking insulin, Hgb A1c checked today  Anesthesia review: recent hospital admissions and heart tests.   Patient denies shortness of breath, fever, cough and chest pain at PAT appointment   Patient verbalized understanding of instructions that were given to them at the PAT appointment. Patient was also instructed that they will need to review over the PAT instructions again at home before surgery.

## 2018-02-25 NOTE — ED Triage Notes (Signed)
Per EMS, pt from home. C/o generalized abd pain with N/Vx4. Unable to keep anything down. Tenderness to palpation. Hx of gall stones. Pt is scheduled to have gall bladder removed in about a week. Pt reports pain feels similar to gallbladder problems. Hx of diabetes - ems cbg 270.  EMS VS BP 195/109. Hr 95, 96% on 2L [place] which he wears at baseline.

## 2018-02-26 ENCOUNTER — Encounter (HOSPITAL_COMMUNITY): Payer: Self-pay | Admitting: Emergency Medicine

## 2018-02-26 ENCOUNTER — Emergency Department (HOSPITAL_COMMUNITY): Payer: Medicare Other

## 2018-02-26 DIAGNOSIS — K8066 Calculus of gallbladder and bile duct with acute and chronic cholecystitis without obstruction: Secondary | ICD-10-CM | POA: Diagnosis present

## 2018-02-26 DIAGNOSIS — D573 Sickle-cell trait: Secondary | ICD-10-CM | POA: Diagnosis present

## 2018-02-26 DIAGNOSIS — E1122 Type 2 diabetes mellitus with diabetic chronic kidney disease: Secondary | ICD-10-CM | POA: Diagnosis present

## 2018-02-26 DIAGNOSIS — J9811 Atelectasis: Secondary | ICD-10-CM | POA: Diagnosis present

## 2018-02-26 DIAGNOSIS — J9621 Acute and chronic respiratory failure with hypoxia: Secondary | ICD-10-CM | POA: Diagnosis present

## 2018-02-26 DIAGNOSIS — Z833 Family history of diabetes mellitus: Secondary | ICD-10-CM | POA: Diagnosis not present

## 2018-02-26 DIAGNOSIS — E872 Acidosis: Secondary | ICD-10-CM | POA: Diagnosis present

## 2018-02-26 DIAGNOSIS — N184 Chronic kidney disease, stage 4 (severe): Secondary | ICD-10-CM | POA: Diagnosis present

## 2018-02-26 DIAGNOSIS — I5033 Acute on chronic diastolic (congestive) heart failure: Secondary | ICD-10-CM | POA: Diagnosis present

## 2018-02-26 DIAGNOSIS — Z832 Family history of diseases of the blood and blood-forming organs and certain disorders involving the immune mechanism: Secondary | ICD-10-CM | POA: Diagnosis not present

## 2018-02-26 DIAGNOSIS — Z01810 Encounter for preprocedural cardiovascular examination: Secondary | ICD-10-CM | POA: Diagnosis not present

## 2018-02-26 DIAGNOSIS — I5030 Unspecified diastolic (congestive) heart failure: Secondary | ICD-10-CM | POA: Diagnosis not present

## 2018-02-26 DIAGNOSIS — K219 Gastro-esophageal reflux disease without esophagitis: Secondary | ICD-10-CM | POA: Diagnosis present

## 2018-02-26 DIAGNOSIS — I272 Pulmonary hypertension, unspecified: Secondary | ICD-10-CM | POA: Diagnosis not present

## 2018-02-26 DIAGNOSIS — I509 Heart failure, unspecified: Secondary | ICD-10-CM | POA: Diagnosis not present

## 2018-02-26 DIAGNOSIS — D509 Iron deficiency anemia, unspecified: Secondary | ICD-10-CM | POA: Diagnosis present

## 2018-02-26 DIAGNOSIS — J45909 Unspecified asthma, uncomplicated: Secondary | ICD-10-CM | POA: Diagnosis present

## 2018-02-26 DIAGNOSIS — K8040 Calculus of bile duct with cholecystitis, unspecified, without obstruction: Secondary | ICD-10-CM | POA: Diagnosis not present

## 2018-02-26 DIAGNOSIS — E1121 Type 2 diabetes mellitus with diabetic nephropathy: Secondary | ICD-10-CM | POA: Diagnosis present

## 2018-02-26 DIAGNOSIS — Z841 Family history of disorders of kidney and ureter: Secondary | ICD-10-CM | POA: Diagnosis not present

## 2018-02-26 DIAGNOSIS — Z6841 Body Mass Index (BMI) 40.0 and over, adult: Secondary | ICD-10-CM | POA: Diagnosis not present

## 2018-02-26 DIAGNOSIS — N185 Chronic kidney disease, stage 5: Secondary | ICD-10-CM | POA: Diagnosis not present

## 2018-02-26 DIAGNOSIS — E662 Morbid (severe) obesity with alveolar hypoventilation: Secondary | ICD-10-CM | POA: Diagnosis present

## 2018-02-26 DIAGNOSIS — I132 Hypertensive heart and chronic kidney disease with heart failure and with stage 5 chronic kidney disease, or end stage renal disease: Secondary | ICD-10-CM | POA: Diagnosis present

## 2018-02-26 DIAGNOSIS — J9622 Acute and chronic respiratory failure with hypercapnia: Secondary | ICD-10-CM | POA: Diagnosis present

## 2018-02-26 DIAGNOSIS — Z888 Allergy status to other drugs, medicaments and biological substances status: Secondary | ICD-10-CM | POA: Diagnosis not present

## 2018-02-26 DIAGNOSIS — K819 Cholecystitis, unspecified: Secondary | ICD-10-CM | POA: Diagnosis present

## 2018-02-26 DIAGNOSIS — I2729 Other secondary pulmonary hypertension: Secondary | ICD-10-CM | POA: Diagnosis present

## 2018-02-26 DIAGNOSIS — N179 Acute kidney failure, unspecified: Secondary | ICD-10-CM | POA: Diagnosis present

## 2018-02-26 DIAGNOSIS — I130 Hypertensive heart and chronic kidney disease with heart failure and stage 1 through stage 4 chronic kidney disease, or unspecified chronic kidney disease: Secondary | ICD-10-CM | POA: Diagnosis not present

## 2018-02-26 DIAGNOSIS — Z8249 Family history of ischemic heart disease and other diseases of the circulatory system: Secondary | ICD-10-CM | POA: Diagnosis not present

## 2018-02-26 DIAGNOSIS — K429 Umbilical hernia without obstruction or gangrene: Secondary | ICD-10-CM | POA: Diagnosis present

## 2018-02-26 LAB — URINALYSIS, ROUTINE W REFLEX MICROSCOPIC
Bilirubin Urine: NEGATIVE
Hgb urine dipstick: NEGATIVE
KETONES UR: NEGATIVE mg/dL
Leukocytes, UA: NEGATIVE
Nitrite: NEGATIVE
PH: 6 (ref 5.0–8.0)
Protein, ur: >=300 mg/dL — AB
SPECIFIC GRAVITY, URINE: 1.011 (ref 1.005–1.030)

## 2018-02-26 LAB — RAPID URINE DRUG SCREEN, HOSP PERFORMED
AMPHETAMINES: NOT DETECTED
BARBITURATES: NOT DETECTED
BENZODIAZEPINES: NOT DETECTED
COCAINE: NOT DETECTED
Opiates: NOT DETECTED
Tetrahydrocannabinol: NOT DETECTED

## 2018-02-26 LAB — GLUCOSE, CAPILLARY
GLUCOSE-CAPILLARY: 202 mg/dL — AB (ref 70–99)
Glucose-Capillary: 280 mg/dL — ABNORMAL HIGH (ref 70–99)
Glucose-Capillary: 260 mg/dL — ABNORMAL HIGH (ref 70–99)
Glucose-Capillary: 168 mg/dL — ABNORMAL HIGH (ref 70–99)

## 2018-02-26 LAB — COMPREHENSIVE METABOLIC PANEL
ALBUMIN: 2.8 g/dL — AB (ref 3.5–5.0)
ALT: 65 U/L — AB (ref 0–44)
ALT: 79 U/L — ABNORMAL HIGH (ref 0–44)
ANION GAP: 15 (ref 5–15)
AST: 155 U/L — AB (ref 15–41)
AST: 74 U/L — AB (ref 15–41)
Albumin: 2.8 g/dL — ABNORMAL LOW (ref 3.5–5.0)
Alkaline Phosphatase: 335 U/L — ABNORMAL HIGH (ref 38–126)
Alkaline Phosphatase: 314 U/L — ABNORMAL HIGH (ref 38–126)
Anion gap: 12 (ref 5–15)
BILIRUBIN TOTAL: 0.5 mg/dL (ref 0.3–1.2)
BUN: 34 mg/dL — AB (ref 6–20)
BUN: 34 mg/dL — AB (ref 6–20)
CALCIUM: 8.2 mg/dL — AB (ref 8.9–10.3)
CHLORIDE: 102 mmol/L (ref 98–111)
CO2: 25 mmol/L (ref 22–32)
CO2: 24 mmol/L (ref 22–32)
CREATININE: 3.41 mg/dL — AB (ref 0.61–1.24)
Calcium: 8.6 mg/dL — ABNORMAL LOW (ref 8.9–10.3)
Chloride: 100 mmol/L (ref 98–111)
Creatinine, Ser: 3.66 mg/dL — ABNORMAL HIGH (ref 0.61–1.24)
GFR calc Af Amer: 23 mL/min — ABNORMAL LOW (ref >60)
GFR calc Af Amer: 21 mL/min — ABNORMAL LOW (ref >60)
GFR calc non Af Amer: 20 mL/min — ABNORMAL LOW (ref >60)
GFR calc non Af Amer: 18 mL/min — ABNORMAL LOW (ref >60)
GLUCOSE: 277 mg/dL — AB (ref 70–99)
Glucose, Bld: 310 mg/dL — ABNORMAL HIGH (ref 70–99)
POTASSIUM: 3.9 mmol/L (ref 3.5–5.1)
POTASSIUM: 4.2 mmol/L (ref 3.5–5.1)
Sodium: 139 mmol/L (ref 135–145)
Sodium: 139 mmol/L (ref 135–145)
Total Bilirubin: 1.4 mg/dL — ABNORMAL HIGH (ref 0.3–1.2)
Total Protein: 7.3 g/dL (ref 6.5–8.1)
Total Protein: 6.9 g/dL (ref 6.5–8.1)

## 2018-02-26 LAB — CBC
HCT: 33.1 % — ABNORMAL LOW (ref 39.0–52.0)
Hemoglobin: 10.1 g/dL — ABNORMAL LOW (ref 13.0–17.0)
MCH: 24.2 pg — ABNORMAL LOW (ref 26.0–34.0)
MCHC: 30.5 g/dL (ref 30.0–36.0)
MCV: 79.4 fL (ref 78.0–100.0)
Platelets: 361 10*3/uL (ref 150–400)
RBC: 4.17 MIL/uL — ABNORMAL LOW (ref 4.22–5.81)
RDW: 16.3 % — AB (ref 11.5–15.5)
WBC: 12.5 10*3/uL — ABNORMAL HIGH (ref 4.0–10.5)

## 2018-02-26 LAB — SURGICAL PCR SCREEN
MRSA, PCR: NEGATIVE
Staphylococcus aureus: NEGATIVE

## 2018-02-26 LAB — LIPASE, BLOOD: LIPASE: 76 U/L — AB (ref 11–51)

## 2018-02-26 LAB — CREATININE, SERUM
CREATININE: 3.4 mg/dL — AB (ref 0.61–1.24)
GFR calc Af Amer: 23 mL/min — ABNORMAL LOW (ref >60)
GFR, EST NON AFRICAN AMERICAN: 20 mL/min — AB (ref >60)

## 2018-02-26 MED ORDER — SODIUM CHLORIDE 0.9 % IV SOLN
2.0000 g | INTRAVENOUS | Status: DC
  Administered 2018-02-27 – 2018-03-01 (×3): 2 g via INTRAVENOUS
  Filled 2018-02-26 (×3): qty 20

## 2018-02-26 MED ORDER — DICYCLOMINE HCL 10 MG/ML IM SOLN
20.0000 mg | Freq: Once | INTRAMUSCULAR | Status: AC
  Administered 2018-02-26: 20 mg via INTRAMUSCULAR
  Filled 2018-02-26: qty 2

## 2018-02-26 MED ORDER — SODIUM CHLORIDE 0.9 % IV SOLN
2.0000 g | Freq: Once | INTRAVENOUS | Status: AC
  Administered 2018-02-26: 2 g via INTRAVENOUS
  Filled 2018-02-26: qty 20

## 2018-02-26 MED ORDER — FUROSEMIDE 10 MG/ML IJ SOLN
40.0000 mg | Freq: Two times a day (BID) | INTRAMUSCULAR | Status: DC
  Administered 2018-02-26: 40 mg via INTRAVENOUS
  Filled 2018-02-26: qty 4

## 2018-02-26 MED ORDER — INSULIN GLARGINE 100 UNIT/ML ~~LOC~~ SOLN
25.0000 [IU] | Freq: Every day | SUBCUTANEOUS | Status: DC
  Administered 2018-02-26 – 2018-03-04 (×7): 25 [IU] via SUBCUTANEOUS
  Filled 2018-02-26 (×7): qty 0.25

## 2018-02-26 MED ORDER — OXYCODONE HCL 5 MG PO TABS: 10.0000 mg | ORAL_TABLET | Freq: Four times a day (QID) | ORAL | Status: DC | PRN

## 2018-02-26 MED ORDER — ONDANSETRON HCL 4 MG PO TABS: 4.0000 mg | ORAL_TABLET | Freq: Four times a day (QID) | ORAL | Status: DC | PRN

## 2018-02-26 MED ORDER — METRONIDAZOLE IN NACL 5-0.79 MG/ML-% IV SOLN
500.0000 mg | Freq: Three times a day (TID) | INTRAVENOUS | Status: DC
  Administered 2018-02-26 – 2018-03-01 (×9): 500 mg via INTRAVENOUS
  Filled 2018-02-26 (×11): qty 100

## 2018-02-26 MED ORDER — LACTATED RINGERS IV SOLN: INTRAVENOUS | Status: DC

## 2018-02-26 MED ORDER — FUROSEMIDE 10 MG/ML IJ SOLN
80.0000 mg | Freq: Two times a day (BID) | INTRAMUSCULAR | Status: DC
  Administered 2018-02-26: 80 mg via INTRAVENOUS
  Filled 2018-02-26: qty 8

## 2018-02-26 MED ORDER — INSULIN ASPART 100 UNIT/ML ~~LOC~~ SOLN
0.0000 [IU] | SUBCUTANEOUS | Status: DC
  Administered 2018-02-26: 4 [IU] via SUBCUTANEOUS
  Administered 2018-02-26 (×2): 11 [IU] via SUBCUTANEOUS
  Administered 2018-02-27 (×3): 3 [IU] via SUBCUTANEOUS
  Administered 2018-02-27: 7 [IU] via SUBCUTANEOUS
  Administered 2018-02-27: 3 [IU] via SUBCUTANEOUS
  Administered 2018-02-28: 7 [IU] via SUBCUTANEOUS
  Administered 2018-02-28 (×4): 3 [IU] via SUBCUTANEOUS
  Administered 2018-03-01: 4 [IU] via SUBCUTANEOUS
  Administered 2018-03-01: 3 [IU] via SUBCUTANEOUS
  Administered 2018-03-01: 7 [IU] via SUBCUTANEOUS
  Administered 2018-03-01: 4 [IU] via SUBCUTANEOUS
  Administered 2018-03-02: 3 [IU] via SUBCUTANEOUS
  Administered 2018-03-02 (×3): 4 [IU] via SUBCUTANEOUS

## 2018-02-26 MED ORDER — PREGABALIN 75 MG PO CAPS
150.0000 mg | ORAL_CAPSULE | Freq: Three times a day (TID) | ORAL | Status: DC
  Administered 2018-02-26 – 2018-02-27 (×6): 150 mg via ORAL
  Filled 2018-02-26 (×5): qty 2
  Filled 2018-02-26: qty 6

## 2018-02-26 MED ORDER — WHITE PETROLATUM EX OINT
TOPICAL_OINTMENT | CUTANEOUS | Status: AC
  Filled 2018-02-26: qty 28.35

## 2018-02-26 MED ORDER — ONDANSETRON HCL 4 MG/2ML IJ SOLN
4.0000 mg | Freq: Four times a day (QID) | INTRAMUSCULAR | Status: DC | PRN
  Administered 2018-02-26 – 2018-02-27 (×2): 4 mg via INTRAVENOUS
  Filled 2018-02-26 (×2): qty 2

## 2018-02-26 MED ORDER — HYDRALAZINE HCL 25 MG PO TABS: 50.0000 mg | ORAL_TABLET | Freq: Three times a day (TID) | ORAL | Status: DC

## 2018-02-26 MED ORDER — HYDROMORPHONE HCL 1 MG/ML IJ SOLN
0.5000 mg | Freq: Once | INTRAMUSCULAR | Status: AC
  Administered 2018-02-26: 0.5 mg via INTRAVENOUS
  Filled 2018-02-26: qty 1

## 2018-02-26 MED ORDER — ALBUTEROL SULFATE (2.5 MG/3ML) 0.083% IN NEBU: 2.5000 mg | INHALATION_SOLUTION | RESPIRATORY_TRACT | Status: DC | PRN

## 2018-02-26 MED ORDER — DEXTROSE 50 % IV SOLN
INTRAVENOUS | Status: AC
  Filled 2018-02-26: qty 50

## 2018-02-26 MED ORDER — HEPARIN SODIUM (PORCINE) 5000 UNIT/ML IJ SOLN
5000.0000 [IU] | Freq: Three times a day (TID) | INTRAMUSCULAR | Status: DC
  Administered 2018-02-26 – 2018-02-27 (×2): 5000 [IU] via SUBCUTANEOUS
  Filled 2018-02-26 (×4): qty 1

## 2018-02-26 MED ORDER — HYDRALAZINE HCL 20 MG/ML IJ SOLN
10.0000 mg | Freq: Three times a day (TID) | INTRAMUSCULAR | Status: DC
  Administered 2018-02-26 (×3): 10 mg via INTRAVENOUS
  Filled 2018-02-26 (×3): qty 1

## 2018-02-26 MED ORDER — HYDROCERIN EX CREA
TOPICAL_CREAM | Freq: Two times a day (BID) | CUTANEOUS | Status: DC
  Administered 2018-02-26: 22:00:00 via TOPICAL
  Administered 2018-02-26: 1 via TOPICAL
  Administered 2018-02-27 – 2018-03-05 (×11): via TOPICAL
  Filled 2018-02-26: qty 113

## 2018-02-26 NOTE — Progress Notes (Signed)
Pt admitted into room 6N30 from ED for potential gallstone removal this admission.  Extremely dry skin noted on bilateral lower extremities, Eucerin cream ordered.  No skin issues noted at present will still need to look at the sacral area. Pt using O2 at 3l as at home prior to admission.

## 2018-02-26 NOTE — Consult Note (Signed)
Reason for Consult: abdominal pain Referring Physician: APril Palumbo  Kenneth Fox is an 47 y.o. male.  HPI: 47 yo male with multiple medical problems. He presented with worsned abdominal pain yesterday and vomiting. Pain was epigastric. He came to the ER. This was worse pain than previous attacks. He was scheduled for surgery on 9/27 for lap chole with Dr. Donne Hazel. His baseline creatinine has been elevated for the past month and the surgical team was allowing time to establish new baseline.  Past Medical History:  Diagnosis Date  . Asthma   . CHF (congestive heart failure) ([place])   . Diabetes ([place])   . Elevated liver function tests   . Gastroparesis   . GERD (gastroesophageal reflux disease)   . Hypertension   . Kidney disorder   . Neuropathy   . Obesity   . Obstructive sleep apnea    uses CPAP  . Renal insufficiency   . Sickle cell trait [facility])     Past Surgical History:  Procedure Laterality Date  . ENDOSCOPIC RETROGRADE CHOLANGIOPANCREATOGRAPHY (ERCP) WITH PROPOFOL N/A 01/19/2018   Procedure: ENDOSCOPIC RETROGRADE CHOLANGIOPANCREATOGRAPHY (ERCP) WITH PROPOFOL;  Surgeon: Rush Landmark Telford Nab., MD;  Location: [place];  Service: Gastroenterology;  Laterality: N/A;  . HIP FRACTURE SURGERY Right 1999   "put a plate in"  . REMOVAL OF STONES  01/19/2018   Procedure: REMOVAL OF STONES;  Surgeon: Rush Landmark Telford Nab., MD;  Location: [place];  Service: Gastroenterology;;  . Joan Mayans  01/19/2018   Procedure: Joan Mayans;  Surgeon: Mansouraty, Telford Nab., MD;  Location: [facility] ENDOSCOPY;  Service: Gastroenterology;;    Family History  Problem Relation Age of Onset  . Sickle cell anemia Mother   . Heart attack Mother   . Diabetes Father   . Neuropathy Father   . Hypertension Father   . Aneurysm Father   . Kidney disease Brother   . Kidney disease Sister     Social History:  reports that he has never smoked. He has never used smokeless tobacco. He reports  that he does not drink alcohol or use drugs.  Allergies:  Allergies  Allergen Reactions  . Angiotensin Receptor Blockers Other (See Comments)    Acute renal failure in patient w R heart failure  . Lisinopril Other (See Comments)    Patient developed AKI after being on lisinopril for a week.    Medications: I have reviewed the patient's current medications.  Results for orders placed or performed during the hospital encounter of 02/25/18 (from the past 48 hour(s))  Lipase, blood     Status: Abnormal   Collection Time: 02/25/18 11:16 PM  Result Value Ref Range   Lipase 76 (H) 11 - 51 U/L    Comment: Performed at [facility] [address] [place], [place] [address]  Comprehensive metabolic panel     Status: Abnormal   Collection Time: 02/25/18 11:16 PM  Result Value Ref Range   Sodium 139 135 - 145 mmol/L   Potassium 3.9 3.5 - 5.1 mmol/L   Chloride 102 98 - 111 mmol/L   CO2 25 22 - 32 mmol/L   Glucose, Bld 277 (H) 70 - 99 mg/dL   BUN 34 (H) 6 - 20 mg/dL   Creatinine, Ser 3.41 (H) 0.61 - 1.24 mg/dL   Calcium 8.6 (L) 8.9 - 10.3 mg/dL   Total Protein 7.3 6.5 - 8.1 g/dL   Albumin 2.8 (L) 3.5 - 5.0 g/dL   AST 155 (H) 15 - 41 U/L  ALT 65 (H) 0 - 44 U/L   Alkaline Phosphatase 335 (H) 38 - 126 U/L   Total Bilirubin 1.4 (H) 0.3 - 1.2 mg/dL   GFR calc non Af Amer 20 (L) >60 mL/min   GFR calc Af Amer 23 (L) >60 mL/min    Comment: (NOTE) The eGFR has been calculated using the CKD EPI equation. This calculation has not been validated in all clinical situations. eGFR's persistently <60 mL/min signify possible Chronic Kidney Disease.    Anion gap 12 5 - 15    Comment: Performed at [facility] [address], [place], [place] [address]  CBC     Status: Abnormal   Collection Time: 02/25/18 11:16 PM  Result Value Ref Range   WBC 8.6 4.0 - 10.5 K/uL   RBC 4.09 (L) 4.22 - 5.81 MIL/uL   Hemoglobin 9.9 (L) 13.0 - 17.0 g/dL   HCT 32.3 (L) 39.0 - 52.0 %   MCV 79.0 78.0 -  100.0 fL   MCH 24.2 (L) 26.0 - 34.0 pg   MCHC 30.7 30.0 - 36.0 g/dL   RDW 16.2 (H) 11.5 - 15.5 %   Platelets 363 150 - 400 K/uL    Comment: Performed at [facility], [place] [address], [place], [place] [address]  Urinalysis, Routine w reflex microscopic     Status: Abnormal   Collection Time: 02/26/18  4:58 AM  Result Value Ref Range   Color, Urine YELLOW YELLOW   APPearance CLEAR CLEAR   Specific Gravity, Urine 1.011 1.005 - 1.030   pH 6.0 5.0 - 8.0   Glucose, UA >=500 (A) NEGATIVE mg/dL   Hgb urine dipstick NEGATIVE NEGATIVE   Bilirubin Urine NEGATIVE NEGATIVE   Ketones, ur NEGATIVE NEGATIVE mg/dL   Protein, ur >=300 (A) NEGATIVE mg/dL   Nitrite NEGATIVE NEGATIVE   Leukocytes, UA NEGATIVE NEGATIVE   RBC / HPF 0-5 0 - 5 RBC/hpf   WBC, UA 0-5 0 - 5 WBC/hpf   Bacteria, UA RARE (A) NONE SEEN   Mucus PRESENT     Comment: Performed at [facility] [address] [place], [place] [address]  Rapid urine drug screen (hospital performed)     Status: None   Collection Time: 02/26/18  4:58 AM  Result Value Ref Range   Opiates NONE DETECTED NONE DETECTED   Cocaine NONE DETECTED NONE DETECTED   Benzodiazepines NONE DETECTED NONE DETECTED   Amphetamines NONE DETECTED NONE DETECTED   Tetrahydrocannabinol NONE DETECTED NONE DETECTED   Barbiturates NONE DETECTED NONE DETECTED    Comment: (NOTE) DRUG SCREEN FOR MEDICAL PURPOSES ONLY.  IF CONFIRMATION IS NEEDED FOR ANY PURPOSE, NOTIFY LAB WITHIN 5 DAYS. LOWEST DETECTABLE LIMITS FOR URINE DRUG SCREEN Drug Class                     Cutoff (ng/mL) Amphetamine and metabolites    1000 Barbiturate and metabolites    200 Benzodiazepine                 329 Tricyclics and metabolites     300 Opiates and metabolites        300 Cocaine and metabolites        300 THC                            50 Performed at [facility], [place] [address] [place],  [address]  US Abdomen Complete  Result Date: 02/26/2018 CLINICAL  DATA:  47 year old male with abdominal pain. History of cholelithiasis and cholangitis status post extraction of common bile duct stone by ERCP. EXAM: ABDOMEN ULTRASOUND COMPLETE COMPARISON:  Renal ultrasound dated 01/22/2018 and CT of the abdomen dated 01/20/2018 FINDINGS: Evaluation is very limited due to patient's body habitus and difficulty positioning the patient. Gallbladder: The gallbladder is suboptimally visualized and appears to be filled with stone with associated posterior shadowing. The gallbladder wall measures approximately 5 mm in thickness. No pericholecystic fluid. Negative sonographic Murphy's sign. Common bile duct: Diameter: 8 mm Liver: There is increased liver echogenicity likely a degree of fatty infiltration. The main portal vein is poorly visualized. The visualized portions appear patent with appropriate direction flow. IVC: Not seen Pancreas: Not visualized and obscured by bowel gas. Spleen: Poorly visualized. Right Kidney: Length: 11.1 cm. Echogenicity within normal limits. No mass or hydronephrosis visualized. Left Kidney: Length: Not well seen. Abdominal aorta: Not visualized. Other findings: None. IMPRESSION: Cholelithiasis with mild thickening of the gallbladder wall but no other sonographic evidence acute cholecystitis. Electronically Signed   By: Anner Crete M.D.   On: 02/26/2018 01:28   Ct Renal Stone Study  Result Date: 02/26/2018 CLINICAL DATA:  47 y/o  M; abdominal pain all over. EXAM: CT ABDOMEN AND PELVIS WITHOUT CONTRAST TECHNIQUE: Multidetector CT imaging of the abdomen and pelvis was performed following the standard protocol without IV contrast. COMPARISON:  02/26/2018 abdominal ultrasound. 01/15/2018 CT abdomen and pelvis. FINDINGS: Lower chest: Platelike atelectasis of the lung bases. Hepatobiliary: No focal liver lesion. Mild gallbladder wall thickening and faint pericholecystic edema. Cholelithiasis on ultrasound are radiolucent on CT. No biliary ductal  dilatation. Pancreas: Unremarkable. No pancreatic ductal dilatation or surrounding inflammatory changes. Spleen: Normal in size without focal abnormality. Adrenals/Urinary Tract: Adrenal glands are unremarkable. Kidneys are normal, without renal calculi, focal lesion, or hydronephrosis. Bladder is unremarkable. Stomach/Bowel: Stomach is within normal limits. Appendix appears normal. No evidence of bowel wall thickening, distention, or inflammatory changes. Vascular/Lymphatic: No significant vascular findings are present. No enlarged abdominal or pelvic lymph nodes. Reproductive: Unremarkable. Other: Small paraumbilical hernia containing fat. Musculoskeletal: No fracture is seen. Right posterior acetabulum and iliac chronic fixed fracture. IMPRESSION: 1. Mild gallbladder wall thickening and faint pericholecystic edema. Cholelithiasis on ultrasound are radiolucent on CT. Findings may represent acute cholecystitis in the appropriate clinical setting. 2. Small paraumbilical hernia containing fat. Electronically Signed   By: Kristine Garbe M.D.   On: 02/26/2018 02:45    Review of Systems  Constitutional: Negative for chills and fever.  HENT: Negative for hearing loss.   Eyes: Negative for blurred vision and double vision.  Respiratory: Negative for cough and hemoptysis.   Cardiovascular: Negative for chest pain and palpitations.  Gastrointestinal: Positive for abdominal pain, nausea and vomiting.  Genitourinary: Negative for dysuria and urgency.  Musculoskeletal: Negative for myalgias and neck pain.  Skin: Negative for itching and rash.  Neurological: Negative for dizziness, tingling and headaches.  Endo/Heme/Allergies: Does not bruise/bleed easily.  Psychiatric/Behavioral: Negative for depression and suicidal ideas.   Blood pressure (!) 148/77, pulse 93, temperature 98.2 F (36.8 C), temperature source Oral, resp. rate 16, height 5' 11"  (1.803 m), weight (!) 148.3 kg, SpO2 97 %. Physical  Exam  Vitals reviewed. Constitutional: He is oriented to person, place, and time. He appears well-developed and well-nourished.  HENT:  Head: Normocephalic and atraumatic.  Eyes: Pupils are equal, round, and reactive to light. Conjunctivae and EOM are normal.  Neck: Normal range of motion. Neck supple.  Cardiovascular: Normal rate and regular rhythm.  Respiratory: Effort normal and breath sounds normal.  GI: Soft. Bowel sounds are normal. He exhibits no distension. There is no tenderness.  Musculoskeletal: Normal range of motion.  Neurological: He is alert and oriented to person, place, and time.  Skin: Skin is warm and dry.  Psychiatric: He has a normal mood and affect. His behavior is normal.    Assessment/Plan: 47 yo male with multiple major medical problems with signs of acute cholecystitis with wall thickening, LFT elevation. Bilirubin 1.7 -recommend admission to medicine for management of medical conditions -I think he would benefit from lap chole with IOC during this admission -If medicine feels comfortable that his medical issues are controlled, we can plan to get his gallbladder out over the weekend, otherwise we will follow along during medical optimization  Arta Bruce Judge Duque 02/26/2018, 8:44 AM

## 2018-02-26 NOTE — ED Provider Notes (Signed)
[facility] EMERGENCY DEPARTMENT Provider Note   CSN: [id number] Arrival date & time: 02/25/18  2301     History   Chief Complaint Chief Complaint  Patient presents with  . Abdominal Pain    HPI Kenneth Fox is a 47 y.o. male.  The history is provided by the patient.  Abdominal Pain   This is a recurrent problem. The current episode started 6 to 12 hours ago. The problem occurs constantly. The problem has not changed since onset.The pain is associated with an unknown factor. The pain is located in the RUQ. The quality of the pain is colicky. The pain is severe. Pertinent negatives include fever, vomiting, hematuria and arthralgias. Nothing aggravates the symptoms. Nothing relieves the symptoms. Past workup includes CT scan. His past medical history is significant for gallstones.    Past Medical History:  Diagnosis Date  . Asthma   . CHF (congestive heart failure) ([facility])   . Diabetes ([facility])   . Elevated liver function tests   . Gastroparesis   . GERD (gastroesophageal reflux disease)   . Hypertension   . Kidney disorder   . Neuropathy   . Obesity   . Obstructive sleep apnea    uses CPAP  . Renal insufficiency   . Sickle cell trait [facility])     Patient Active Problem List   Diagnosis Date Noted  . Choledocholithiasis with acute cholecystitis with obstruction   . Cholangitis due to bile duct calculus with obstruction   . Acute cholecystitis 01/15/2018  . Oxygen dependent   . AKI (acute kidney injury) ([facility])   . Obesity hypoventilation syndrome ([facility])   . Morbid obesity ([facility])   . Chronic diastolic heart failure ([facility])   . Diastolic CHF ([facility]) 01/10/2018  . Acute kidney injury superimposed on CKD ([facility]) 03/04/2017  . Acute on chronic respiratory failure with hypoxia and hypercapnia ([facility]) 10/26/2016  . Uncontrolled hypertension 10/26/2016  . Fluid overload 10/13/2016  . Acute on chronic diastolic CHF (congestive heart failure) ([facility]) 10/13/2016  . CHF  (congestive heart failure) ([facility]) 10/13/2016  . Acute kidney injury (HCC) 05/09/2016  . Dysphagia 12/12/2015  . Intractable nausea and vomiting 12/11/2015  . Leukocytosis 06/02/2015  . Morbid obesity due to excess calories ([facility]) 06/02/2015  . Diabetes mellitus type 2, insulin dependent ([facility])   . Elevated troponin 06/01/2015  . Acute on chronic renal failure ([facility]) 12/03/2013  . Adverse reaction to lisinopril 12/03/2013  . Hypokalemia 11/25/2013  . Diabetic gastroparesis ([facility]CC) 11/17/2013  . OSA on CPAP 11/07/2012  . Essential hypertension 05/18/2011    Past Surgical History:  Procedure Laterality Date  . ENDOSCOPIC RETROGRADE CHOLANGIOPANCREATOGRAPHY (ERCP) WITH PROPOFOL N/A 01/19/2018   Procedure: ENDOSCOPIC RETROGRADE CHOLANGIOPANCREATOGRAPHY (ERCP) WITH PROPOFOL;  Surgeon: Meridee Score Netty Starring., MD;  Location: [facility] ENDOSCOPY;  Service: Gastroenterology;  Laterality: N/A;  . HIP FRACTURE SURGERY Right 1999   "put a plate in"  . REMOVAL OF STONES  01/19/2018   Procedure: REMOVAL OF STONES;  Surgeon: Meridee Score Netty Starring., MD;  Location: [facility] ENDOSCOPY;  Service: Gastroenterology;;  . Dennison Mascot  01/19/2018   Procedure: Dennison Mascot;  Surgeon: Mansouraty, Netty Starring., MD;  Location: [facility] ENDOSCOPY;  Service: Gastroenterology;;        Home Medications    Prior to Admission medications   Medication Sig Start Date End Date Taking? Authorizing Provider  acetaminophen (TYLENOL) 500 MG tablet Take 1 tablet (500 mg total) by mouth every 6 (six) hours as needed. Patient taking differently: Take 500 mg by  mouth every 6 (six) hours as needed for mild pain or headache.  02/05/17   Law, Waylan BogaAlexandra M, PA-C  cloNIDine (CATAPRES) 0.2 MG tablet Take 0.2 mg by mouth 2 (two) times daily. 01/06/18   [provider]  furosemide (LASIX) 40 MG tablet Take 120 mg by mouth 2 (two) times daily.    [provider]  furosemide (LASIX) 80 MG tablet Take 1 tablet (80 mg total) by mouth 2 (two)  times daily. Patient not taking: Reported on 02/23/2018 01/24/18 02/23/18  Rehman, Areeg N, DO  hydrALAZINE (APRESOLINE) 50 MG tablet Take 1 tablet (50 mg total) by mouth 3 (three) times daily. Patient taking differently: Take 75 mg by mouth 2 (two) times daily.  01/24/18 02/23/18  Rehman, Areeg N, DO  Iron-FA-B Cmp-C-Biot-Probiotic (FUSION PLUS PO) Take 1 capsule by mouth daily.    [provider]  LEVEMIR FLEXTOUCH 100 UNIT/ML Pen Inject 40 Units into the skin 2 (two) times daily. 02/11/18   [provider]  LYRICA 150 MG capsule Take 150 mg by mouth 3 (three) times daily. 02/07/18   [provider]  methocarbamol (ROBAXIN) 500 MG tablet Take 500-1,000 mg by mouth 3 (three) times daily as needed for spasms. 02/01/18   [provider]  metoCLOPramide (REGLAN) 10 MG tablet Take 0.5 tablets (5 mg total) by mouth every 8 (eight) hours as needed for nausea. Patient not taking: Reported on 02/23/2018 01/08/18   Linwood DibblesKnapp, Jon, MD  NOVOLOG FLEXPEN 100 UNIT/ML FlexPen Inject 10-25 Units into the skin 3 (three) times daily with meals. SLIDING SCALE INSULIN 02/11/18   [provider]  Oxycodone HCl 10 MG TABS Take 10 mg by mouth every 6 (six) hours as needed for pain.    [provider]  Potassium Chloride ER 20 MEQ TBCR Take 20 mEq by mouth daily. 02/06/18   [provider]  VENTOLIN HFA 108 (90 Base) MCG/ACT inhaler Inhale 2 puffs into the lungs every 4 (four) hours as needed for wheezing or shortness of breath. 02/08/18   [provider]    Family History Family History  Problem Relation Age of Onset  . Sickle cell anemia Mother   . Heart attack Mother   . Diabetes Father   . Neuropathy Father   . Hypertension Father   . Aneurysm Father   . Kidney disease Brother   . Kidney disease Sister     Social History Social History   Tobacco Use  . Smoking status: Never Smoker  . Smokeless tobacco: Never Used  Substance Use Topics  . Alcohol use:  No  . Drug use: No     Allergies   Angiotensin receptor blockers and Lisinopril   Review of Systems Review of Systems  Constitutional: Negative for fever.  Respiratory: Negative for shortness of breath.   Cardiovascular: Negative for chest pain.  Gastrointestinal: Positive for abdominal pain. Negative for vomiting.  Genitourinary: Negative for hematuria.  Musculoskeletal: Negative for arthralgias and neck pain.  All other systems reviewed and are negative.    Physical Exam Updated Vital Signs BP 129/80   Pulse 89   Temp 98.2 F (36.8 C) (Oral)   Resp 12   Ht 5\' 11"  (1.803 m)   Wt (!) 148.3 kg   SpO2 100%   BMI 45.61 kg/m   Physical Exam  Constitutional: He appears well-developed and well-nourished. No distress.  HENT:  Head: Normocephalic and atraumatic.  Mouth/Throat: No oropharyngeal exudate.  Eyes: Pupils are equal, round, and reactive  to light. Conjunctivae are normal.  Neck: Normal range of motion. Neck supple.  Cardiovascular: Normal rate, regular rhythm, normal heart sounds and intact distal pulses.  Pulmonary/Chest: Effort normal. No stridor. He has no wheezes. He has no rales.  Abdominal: Soft. Bowel sounds are normal. There is tenderness. There is positive Murphy's sign. There is no rigidity and no rebound.  Musculoskeletal: Normal range of motion.  Neurological: He is alert.  Skin: Skin is warm and dry. Capillary refill takes less than 2 seconds.  Psychiatric: He has a normal mood and affect.     ED Treatments / Results  Labs (all labs ordered are listed, but only abnormal results are displayed) Labs Reviewed  LIPASE, BLOOD - Abnormal; Notable for the following components:      Result Value   Lipase 76 (*)    All other components within normal limits  COMPREHENSIVE METABOLIC PANEL - Abnormal; Notable for the following components:   Glucose, Bld 277 (*)    BUN 34 (*)    Creatinine, Ser 3.41 (*)    Calcium 8.6 (*)    Albumin 2.8 (*)    AST 155  (*)    ALT 65 (*)    Alkaline Phosphatase 335 (*)    Total Bilirubin 1.4 (*)    GFR calc non Af Amer 20 (*)    GFR calc Af Amer 23 (*)    All other components within normal limits  CBC - Abnormal; Notable for the following components:   RBC 4.09 (*)    Hemoglobin 9.9 (*)    HCT 32.3 (*)    MCH 24.2 (*)    RDW 16.2 (*)    All other components within normal limits  URINALYSIS, ROUTINE W REFLEX MICROSCOPIC - Abnormal; Notable for the following components:   Glucose, UA >=500 (*)    Protein, ur >=300 (*)    Bacteria, UA RARE (*)    All other components within normal limits  RAPID URINE DRUG SCREEN, HOSP PERFORMED    EKG None  Radiology US Abdomen Complete  Result Date: 02/26/2018 CLINICAL DATA:  47 year old male with abdominal pain. History of cholelithiasis and cholangitis status post extraction of common bile duct stone by ERCP. EXAM: ABDOMEN ULTRASOUND COMPLETE COMPARISON:  Renal ultrasound dated 01/22/2018 and CT of the abdomen dated 01/20/2018 FINDINGS: Evaluation is very limited due to patient's body habitus and difficulty positioning the patient. Gallbladder: The gallbladder is suboptimally visualized and appears to be filled with stone with associated posterior shadowing. The gallbladder wall measures approximately 5 mm in thickness. No pericholecystic fluid. Negative sonographic Murphy's sign. Common bile duct: Diameter: 8 mm Liver: There is increased liver echogenicity likely a degree of fatty infiltration. The main portal vein is poorly visualized. The visualized portions appear patent with appropriate direction flow. IVC: Not seen Pancreas: Not visualized and obscured by bowel gas. Spleen: Poorly visualized. Right Kidney: Length: 11.1 cm. Echogenicity within normal limits. No mass or hydronephrosis visualized. Left Kidney: Length: Not well seen. Abdominal aorta: Not visualized. Other findings: None. IMPRESSION: Cholelithiasis with mild thickening of the gallbladder wall but no  other sonographic evidence acute cholecystitis. Electronically Signed   By: Elgie Collard M.D.   On: 02/26/2018 01:28   Ct Renal Stone Study  Result Date: 02/26/2018 CLINICAL DATA:  47 y/o  M; abdominal pain all over. EXAM: CT ABDOMEN AND PELVIS WITHOUT CONTRAST TECHNIQUE: Multidetector CT imaging of the abdomen and pelvis was performed following the standard protocol without IV contrast. COMPARISON:  02/26/2018  abdominal ultrasound. 01/15/2018 CT abdomen and pelvis. FINDINGS: Lower chest: Platelike atelectasis of the lung bases. Hepatobiliary: No focal liver lesion. Mild gallbladder wall thickening and faint pericholecystic edema. Cholelithiasis on ultrasound are radiolucent on CT. No biliary ductal dilatation. Pancreas: Unremarkable. No pancreatic ductal dilatation or surrounding inflammatory changes. Spleen: Normal in size without focal abnormality. Adrenals/Urinary Tract: Adrenal glands are unremarkable. Kidneys are normal, without renal calculi, focal lesion, or hydronephrosis. Bladder is unremarkable. Stomach/Bowel: Stomach is within normal limits. Appendix appears normal. No evidence of bowel wall thickening, distention, or inflammatory changes. Vascular/Lymphatic: No significant vascular findings are present. No enlarged abdominal or pelvic lymph nodes. Reproductive: Unremarkable. Other: Small paraumbilical hernia containing fat. Musculoskeletal: No fracture is seen. Right posterior acetabulum and iliac chronic fixed fracture. IMPRESSION: 1. Mild gallbladder wall thickening and faint pericholecystic edema. Cholelithiasis on ultrasound are radiolucent on CT. Findings may represent acute cholecystitis in the appropriate clinical setting. 2. Small paraumbilical hernia containing fat. Electronically Signed   By: Mitzi Hansen M.D.   On: 02/26/2018 02:45    Procedures Procedures (including critical care time)  Medications Ordered in ED Medications  oxyCODONE-acetaminophen  (PERCOCET/ROXICET) 5-325 MG per tablet 1 tablet (1 tablet Oral Given 02/25/18 2315)  cefTRIAXone (ROCEPHIN) 2 g in sodium chloride 0.9 % 100 mL IVPB (has no administration in time range)  ondansetron (ZOFRAN-ODT) disintegrating tablet 4 mg (4 mg Oral Given 02/25/18 2318)  dicyclomine (BENTYL) injection 20 mg (20 mg Intramuscular Given 02/26/18 0042)     Case d/w Dr. Lindie Spruce [facility] to see the patient.    Final Clinical Impressions(s) / ED Diagnoses   Final diagnoses:  Cholecystitis    Will admit for cholecystitis    Leonarda Leis, MD 02/26/18 1610

## 2018-02-26 NOTE — Progress Notes (Signed)
Patient refused CPAP for tonight. Patient stated he wears CPAP at home but does not want to wear it tonight. RT informed patient to have RT called if he changes his mind. RT will monitor as needed.

## 2018-02-26 NOTE — H&P (Addendum)
Date: 02/26/2018               Patient Name:  Kenneth Fox MRN: [id number]  DOB: February 25, 1971 Age / Sex: 47 y.o., male   PCP: Shanon Rosser, PA-C         Medical Service: Internal Medicine Teaching Service         Attending Physician: Dr. Randal Buba, April, MD    First Contact: Dr. Truman Hayward Pager: [phone]  Second Contact: Dr. Trilby Drummer Pager: [phone]       After Hours (After 5p/  First Contact Pager: [phone]  weekends / holidays): Second Contact Pager: [phone]   Chief Complaint: Nausea, Vomiting  History of Present Illness: Kenneth Fox is a 47 year old male with PMH of HTN, morbid obesity, OSA, T2 DM, diastolic CHF, and CKD presenting with nausea and vomiting. He was in his usual state of health until yesterday morning when he lost his appetite with some nausea. He skipped breakfast and lunch to go to his appointment for pre-op evaluation by anesthesia and took a nap in the afternoon. When he woke up around 7pm, he started having significant worsening nausea with NBNB vomiting. He describes it as clear vomitus but was not sure because it was dark. He also began to endorse RUQ abdominal pain described as similar to his cholecystitis pain from his last admission. He came to the ED because his vomiting was intractable.   He states he has been scheduled for laparoscopic cholecystectomy on 9/27th after his last hospitalization for cholecystitis. He states he has been taking all of his medications as prescribed including his insulin and he denies any diarrhea, constipation. Last bowel movement normal quality stool yesterday. Denies any fever, chest pain, dyspnea. Currently states his abdominal pain has resolved after receiving 1 dose of percocet 5-343m but continuing to endorse significant nausea.  In the ED, CMP showed AST of 155, ALT of 65, Alk Phos of 335. Total Bili of 1.4 Lipase was slightly eleated at 76. CT abdomen showed evidence of acute cholecystitis. Ultrasound of the abdomen showed evidence  of cholelithiasis without pericholecystic fluid. Received 1 dose of ceftriaxone 2g. Was deemed to not have a surgical abdomen at this time. Admit for cholecystitis  Meds:    Allergies: Allergies as of 02/25/2018 - Review Complete 02/25/2018  Allergen Reaction Noted  . Angiotensin receptor blockers Other (See Comments) 06/04/2015  . Lisinopril Other (See Comments) 12/03/2013   Past Medical History:  Diagnosis Date  . Asthma   . CHF ([place]estive heart failure) [place]   . Diabetes ([place]   . Elevated liver function tests   . Gastroparesis   . GERD (gastroesophageal reflux disease)   . Hypertension   . Kidney disorder   . Neuropathy   . Obesity   . Obstructive sleep apnea    uses CPAP  . Renal insufficiency   . Sickle cell trait ([place]     Family History:  Family History  Problem Relation Age of Onset  . Sickle cell anemia Mother   . Heart attack Mother   . Diabetes Father   . Neuropathy Father   . Hypertension Father   . Aneurysm Father   . Kidney disease Brother   . Kidney disease Sister    Social History: Denies smoking, EtOH, IVDU. Lives at home by himself.  Review of Systems: A complete ROS was negative except as per HPI.   Physical Exam: Blood pressure (!) 173/95, pulse (!) 103, temperature 98.2 F (36.8 C), temperature source  Oral, resp. rate 16, height _0  (1.803 m), weight (!) 148.3 kg, SpO2 100 %. Physical Exam  Constitutional: He is well-developed, well-nourished, and in no distress. No distress.  HENT:  Head: Normocephalic and atraumatic.  Mouth/Throat: No oropharyngeal exudate.  Dry mucous membranes  Eyes: Pupils are equal, round, and reactive to light. Conjunctivae and EOM are normal. No scleral icterus.  Neck: Normal range of motion. Neck supple.  Cardiovascular: Regular rhythm, normal heart sounds and intact distal pulses.  Tachycardic  Pulmonary/Chest: Effort normal. No respiratory distress. He has no wheezes. He has rales (bibasilar rales).    Abdominal: Soft. Bowel sounds are normal. He exhibits distension. There is no tenderness. There is no rebound and no guarding.  No Murphy's sign  Musculoskeletal: Normal range of motion. He exhibits no edema.  Has compression stockings on  Lymphadenopathy:    He has no cervical adenopathy.  Skin: Skin is warm and dry. He is not diaphoretic.   EKG: N/A  CXR: N/A  Assessment & Plan by Problem: Kenneth Fox is a 47 year old male with PMH of HTN, morbid obesity, OSA, T2 DM, diastolic CHF, and CKD presenting with nausea and vomiting. He was admitted on 01/15/18 for similar complaints which was diagnosed as acute cholnagitis with cholelithiasis. Bile duct stones were removed by ERCP but cholecystostomy tube placement was unsuccessful. He had evidence of gallstones and plan was for outpatient cholecystectomy on 03/04/18 at discharge. Had pre-op evaluation done by anesthesia yesterday pending note from anesthesia. This admission appears to be another episode of cholecystitis with significant increase in alk phos. Will admit with surgery consult for acute cholecystitis. ACS NSQIP show 5.6% risk of serious complications.  Recommend trial of IV lasix to optimize fluid status before surgery  Nausea, Vomiting, Abdominal Pain 2/2 likely acute cholecystitis vs pancreatitis No leukocytosis. Vitals stable. RUQ pain. Prior hx of acute cholecystitis with choledocholithiasis on 01/15/18. AST 155, ALT 65, Alk phos 335, Total Bili 1.4 Lipase 76 WBC 8.6 - Appreciate surgery recs - C/w Ceftriazone 2g daily - Start Flagyl 535m q8hr - Zofran 426mIV q6 PRN for nausea - Keep NPO - C/w monitor cbc  Chronic Kidney Disease Stage 4 Hx of diabetic nephropathy with proteinuria. Not on ACE or ARB 2/2 hx of AKIs. Creatinine on discharge last admission 3.88. Creatinine this admission 3.4. Chart review show fluctuating creatinine 2.8 - 4.5. 3.4 may be his new baseline - Recheck creatinine now - C/w monitor recheck in  afternoon  Heart Failure with Preserved Ejection Fraction Last Echo in 01/2018 show EF of 55-60%. On Lasix 8071mO BID at home. No acute exacerbations in last 30 days. Weight on 01/24/18 147.6kg Weight this admission 148.3kg Appear well controlled - Start IV Lasix 51m58mD  Type 2 Diabetes Mellitus A1c 8.1. BG on admission 277. 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  - CPAP qhs  HTN Home med hydralazine 50mg 16m- Start Hydralazine 10mg I51mD  Asthma - Proventil q4hr PRN for wheezing  Diet: NPO DVT prophx: subqHeparin Code: Full   Dispo: Admit patient to Inpatient with expected length of stay greater than 2 midnights.  Signed: Britian Jentz, JoMosetta Anis21/2019, 7:54 AM  Pager: [phone]

## 2018-02-26 NOTE — Progress Notes (Signed)
Pt went to the BR and is having extreme heaves,  Zofran 4 mg IV given and he needs something for pain, MD texted.

## 2018-02-26 NOTE — ED Notes (Signed)
Returned from CT.

## 2018-02-27 ENCOUNTER — Other Ambulatory Visit: Payer: Self-pay

## 2018-02-27 ENCOUNTER — Inpatient Hospital Stay (HOSPITAL_COMMUNITY): Payer: Medicare Other

## 2018-02-27 DIAGNOSIS — N179 Acute kidney failure, unspecified: Secondary | ICD-10-CM

## 2018-02-27 DIAGNOSIS — E1129 Type 2 diabetes mellitus with other diabetic kidney complication: Secondary | ICD-10-CM

## 2018-02-27 DIAGNOSIS — E662 Morbid (severe) obesity with alveolar hypoventilation: Secondary | ICD-10-CM

## 2018-02-27 DIAGNOSIS — R809 Proteinuria, unspecified: Secondary | ICD-10-CM

## 2018-02-27 DIAGNOSIS — J9611 Chronic respiratory failure with hypoxia: Secondary | ICD-10-CM

## 2018-02-27 DIAGNOSIS — E1122 Type 2 diabetes mellitus with diabetic chronic kidney disease: Secondary | ICD-10-CM

## 2018-02-27 DIAGNOSIS — Z794 Long term (current) use of insulin: Secondary | ICD-10-CM

## 2018-02-27 DIAGNOSIS — K819 Cholecystitis, unspecified: Secondary | ICD-10-CM

## 2018-02-27 DIAGNOSIS — J45909 Unspecified asthma, uncomplicated: Secondary | ICD-10-CM

## 2018-02-27 DIAGNOSIS — Z79899 Other long term (current) drug therapy: Secondary | ICD-10-CM

## 2018-02-27 DIAGNOSIS — Z8719 Personal history of other diseases of the digestive system: Secondary | ICD-10-CM

## 2018-02-27 DIAGNOSIS — I5030 Unspecified diastolic (congestive) heart failure: Secondary | ICD-10-CM

## 2018-02-27 DIAGNOSIS — N184 Chronic kidney disease, stage 4 (severe): Secondary | ICD-10-CM

## 2018-02-27 DIAGNOSIS — E1140 Type 2 diabetes mellitus with diabetic neuropathy, unspecified: Secondary | ICD-10-CM

## 2018-02-27 DIAGNOSIS — Z9989 Dependence on other enabling machines and devices: Secondary | ICD-10-CM

## 2018-02-27 DIAGNOSIS — Z7951 Long term (current) use of inhaled steroids: Secondary | ICD-10-CM

## 2018-02-27 DIAGNOSIS — I130 Hypertensive heart and chronic kidney disease with heart failure and stage 1 through stage 4 chronic kidney disease, or unspecified chronic kidney disease: Secondary | ICD-10-CM

## 2018-02-27 LAB — GLUCOSE, CAPILLARY
GLUCOSE-CAPILLARY: 110 mg/dL — AB (ref 70–99)
GLUCOSE-CAPILLARY: 148 mg/dL — AB (ref 70–99)
GLUCOSE-CAPILLARY: 127 mg/dL — AB (ref 70–99)
Glucose-Capillary: 112 mg/dL — ABNORMAL HIGH (ref 70–99)
Glucose-Capillary: 129 mg/dL — ABNORMAL HIGH (ref 70–99)
Glucose-Capillary: 129 mg/dL — ABNORMAL HIGH (ref 70–99)
Glucose-Capillary: 149 mg/dL — ABNORMAL HIGH (ref 70–99)
Glucose-Capillary: 205 mg/dL — ABNORMAL HIGH (ref 70–99)

## 2018-02-27 LAB — CBC
HCT: 31.1 % — ABNORMAL LOW (ref 39.0–52.0)
Hemoglobin: 9.5 g/dL — ABNORMAL LOW (ref 13.0–17.0)
MCH: 24.7 pg — ABNORMAL LOW (ref 26.0–34.0)
MCHC: 30.5 g/dL (ref 30.0–36.0)
MCV: 80.8 fL (ref 78.0–100.0)
PLATELETS: 348 10*3/uL (ref 150–400)
RBC: 3.85 MIL/uL — ABNORMAL LOW (ref 4.22–5.81)
RDW: 16.7 % — AB (ref 11.5–15.5)
WBC: 10.2 10*3/uL (ref 4.0–10.5)

## 2018-02-27 LAB — COMPREHENSIVE METABOLIC PANEL
ALT: 60 U/L — ABNORMAL HIGH (ref 0–44)
ANION GAP: 12 (ref 5–15)
AST: 41 U/L (ref 15–41)
Albumin: 2.6 g/dL — ABNORMAL LOW (ref 3.5–5.0)
Alkaline Phosphatase: 274 U/L — ABNORMAL HIGH (ref 38–126)
BILIRUBIN TOTAL: 0.8 mg/dL (ref 0.3–1.2)
BUN: 35 mg/dL — ABNORMAL HIGH (ref 6–20)
CALCIUM: 8.3 mg/dL — AB (ref 8.9–10.3)
CO2: 28 mmol/L (ref 22–32)
Chloride: 102 mmol/L (ref 98–111)
Creatinine, Ser: 4.17 mg/dL — ABNORMAL HIGH (ref 0.61–1.24)
GFR, EST AFRICAN AMERICAN: 18 mL/min — AB (ref >60)
GFR, EST NON AFRICAN AMERICAN: 15 mL/min — AB (ref >60)
Glucose, Bld: 103 mg/dL — ABNORMAL HIGH (ref 70–99)
POTASSIUM: 3.4 mmol/L — AB (ref 3.5–5.1)
Sodium: 142 mmol/L (ref 135–145)
Total Protein: 6.6 g/dL (ref 6.5–8.1)

## 2018-02-27 LAB — BASIC METABOLIC PANEL
Anion gap: 12 (ref 5–15)
BUN: 36 mg/dL — ABNORMAL HIGH (ref 6–20)
CHLORIDE: 102 mmol/L (ref 98–111)
CO2: 24 mmol/L (ref 22–32)
Calcium: 8.3 mg/dL — ABNORMAL LOW (ref 8.9–10.3)
Creatinine, Ser: 4.59 mg/dL — ABNORMAL HIGH (ref 0.61–1.24)
GFR calc Af Amer: 16 mL/min — ABNORMAL LOW (ref >60)
GFR calc non Af Amer: 14 mL/min — ABNORMAL LOW (ref >60)
Glucose, Bld: 200 mg/dL — ABNORMAL HIGH (ref 70–99)
Potassium: 3.9 mmol/L (ref 3.5–5.1)
Sodium: 138 mmol/L (ref 135–145)

## 2018-02-27 LAB — BRAIN NATRIURETIC PEPTIDE: B Natriuretic Peptide: 43.3 pg/mL (ref 0.0–100.0)

## 2018-02-27 MED ORDER — HYDROMORPHONE HCL 1 MG/ML IJ SOLN
0.2500 mg | INTRAMUSCULAR | Status: DC | PRN
  Administered 2018-02-27 – 2018-03-05 (×14): 0.25 mg via INTRAVENOUS
  Filled 2018-02-27 (×14): qty 1

## 2018-02-27 MED ORDER — SODIUM CHLORIDE 0.9 % IV SOLN
INTRAVENOUS | Status: AC
  Administered 2018-02-27: 11:00:00 via INTRAVENOUS

## 2018-02-27 NOTE — Progress Notes (Signed)
Date: 02/27/2018  Patient name: Jamerius Boeckman Cygan  Medical record number: [id number]  Date of birth: Feb 25, 1971   I have seen and evaluated Marya Landry Maute and discussed their care with the Residency Team. Mr Sherrie Mustache is a 47 yo man with morbid obesity, OSA, CKD stage 4, and cholecystitis.  He was admitted in August of this year for choledocholithiasis with cholangitis and was appropriately treated.  Surgery recommended outpatient cholecystectomy about 3 weeks following his discharge.  He did well following discharge until today of admission when he had anorexia, nausea, vomiting, and right upper quadrant pain.  He came to the ED for evaluation and surgery would like to perform an inpatient cholecystectomy once he is medically optimized.  On admission, it was felt that he was slightly volume overloaded and he was given 1 dose of furosemide 40 mg followed by his second dose of 80 mg.  Urine output was not recorded and weights are inaccurate.  This morning, he complains of a stabbing/burning/achy pain in his abdomen that is 8 out of 10.  He states the oxycodone is not helping any and requests something stronger.  PMHx, Fam Hx, and/or Soc Hx :  1.  Sleep study in July 2015 diagnostic for severe OSA, AHI 111.5/h, desaturation to 56% per note but not original documentation. 2. HTN 3. DM II poorly controlled with macroalbuminuria 4.  Diastolic heart failure but most recent echo in August of this year was technically difficult due to body habitus  Vitals:   02/26/18 2203 02/27/18 0555  BP: (!) 143/91 102/73  Pulse:  (!) 101  Resp:  16  Temp:  98.3 F (36.8 C)  SpO2:  98%  T max 98.4 HR 101 RR 16 BP 102/73 98% RA Gen Obese, sitting in chair, slow to respond, lethargic H tachy no MRG LCTAB  ABD + BS Ext warm, trace edema  Cr 3.35 - 4.17 (variable baseline from 3-4) CO2 28 WBC 12.5 - 10.2 RDW 16.7 A1C 8.1 UA > 300 protein since before 2015  Assessment and Plan: I have seen and  evaluated the patient as outlined above. I agree with the formulated Assessment and Plan as detailed in the residents' note, with the following changes: Mr Sherrie Mustache is a 47 yo man with morbid obesity, OSA, CKD stage 4, and cholecystitis.  He was admitted yesterday for an elective cholecystectomy.  His RCRI score indicates that he has a 5.4% risk of perioperative cardiac death, nonfatal MI, and nonfatal cardiac arrest.  He has a 9.1% risk of MI, pulmonary edema, A. fib, cardiac arrest, and complete heart block.  I suspect that this score underestimates his risk as it does not take in consideration his OSA, morbid obesity, or nephropathy all of which contribute to risk.  I do not feel that he is currently medically stable to undergo an elective procedure.  I am concerned about his volume status and am not certain that he is euvolemic.  His physical exam makes it very challenging to ascertain his volume status. So even though he does not have rales on examination and his creatinine bumped with diuresis, I am worried because his mentation is extremely slow (could be 2/2 not wearing CPAP lat PM) and his blood pressure is on the low side and would like cardiology to evaluate [name] to rule out a low cardiac output state/cardiorenal syndrome.  We will get an EKG, PA and lateral chest x-ray, a microalbumin level, and a BMP to further evaluate volume status.  1.  Cholecystitis -unless this is a medical emergency, I do not feel that he is medically optimized and would delay an elective procedure for now.  2.  Heart failure could not be fully characterized on most recent echo -check PA and lateral chest x-ray, BNP, and EKG.  Consult cardiology to assist with work-up and management.  No diuresis until further evaluation.  3.  Poorly controlled diabetes type 2 insulin-dependent with nephropathy -I think his protein excretion is albumin as it has been picked up on his UAs.  Therefore we will get a microalbumin level to  quantify his degree of albuminuria.  4.  Perioperative risk assessment - He has a 5.4% risk of perioperative cardiac death, nonfatal MI, and nonfatal cardiac arrest.  He has a 9.1% risk of MI, pulmonary edema, A. fib, cardiac arrest, and complete heart block.  I do not feel he is yet medically optimized.  Burns SpainButcher, Kiara Mcdowell A, MD 9/22/20199:56 AM

## 2018-02-27 NOTE — Progress Notes (Signed)
Subjective:  Kenneth Fox is a 47 y.o. with PMH of morbid obesity, OSA, T2DM, HFpEF, and CKD admit for cholecystitis on hospital day 1  Kenneth Fox was examined and evaluated at bedside chair this a.m.  Appears drowsy during history taking.  He states he did not use his CPAP overnight.  Currently endorsing significant abdominal pain.  He states his pain is not well controlled and is requesting stronger pain medication.  He states his nausea is still present.  He had some vomitus overnight as well.  Denies any fevers or chills.  Denies any chest pain dyspnea palpitations.  Objective:  Vital signs in last 24 hours: Vitals:   02/26/18 1826 02/26/18 2115 02/26/18 2203 02/27/18 0555  BP: (!) 105/57 (!) 143/91 (!) 143/91 102/73  Pulse: 95 91  (!) 101  Resp: 14 18  16   Temp: 98.4 F (36.9 C) 97.6 F (36.4 C)  98.3 F (36.8 C)  TempSrc: Oral Oral  Oral  SpO2: 99% 98%  98%  Weight:      Height:       Physical Exam  Constitutional: He is oriented to person, place, and time. He appears distressed (in significant abdominal pain).  Morbidly obese  HENT:  Head: Normocephalic and atraumatic.  Mouth/Throat: Oropharynx is clear and moist. No oropharyngeal exudate.  Eyes: Pupils are equal, round, and reactive to light. Conjunctivae and EOM are normal. No scleral icterus.  Neck: Normal range of motion. Neck supple. No JVD (Difficult to assess 2/2 large neck) present.  Cardiovascular: Normal rate, regular rhythm, normal heart sounds and intact distal pulses.  Pulmonary/Chest: Effort normal. No respiratory distress. He has no wheezes. He has rales (trace rales on left side).  Abdominal: Soft. Bowel sounds are normal. He exhibits no distension. There is tenderness (RUQ tenderness to palpation ). There is no rebound and no guarding.  Musculoskeletal: Normal range of motion. He exhibits edema (1+ pitting edema near ankles). He exhibits no tenderness or deformity.  Neurological: He is alert and  oriented to person, place, and time. No cranial nerve deficit. GCS score is 15.  Skin: Skin is warm and dry.     Assessment/Plan:  Active Problems:   Cholecystitis  Kenneth Fox is a 47 year old male admitted for cholecystitis.  He was put on IV diuretics yesterday as he has strong history of fluid retention and volume overload.  Appears less hypervolemic on exam. However his creatinine rose significantly this morning and he may be tipping toward dehydration. Will plan on giving him gentle fluid resuscitation while further testing to evaluate cardiac status as this AM had much lower bp (102/73) than usual. Considering his history of HFpEF, morbid obesity and uncontrolled diabetes with nephropathy advisable to await further medical optimization before cholecystectomy.  Nausea, Vomiting, Abdominal Pain 2/2 likely acute cholecystitis vs pancreatitis Will need cholecystectomy but waiting to optimize CKD and HFpEF first. - Appreciate surgery recs - C/w Ceftriazone 2g daily, Flagyl 500mg  q8hr - Zofran 4mg  IV q6 PRN for nausea - Keep NPO - C/w monitor cbc  AKI on Chronic Kidney Disease Stage 4 Creatinine this am 4.1 <- 3.80<-3.88 Most likely rising 2/2 IV lasix yesterday. BUN/Cr <15. UA show significant proteinuira - Stop Lasix - Start IV NS @ 75cc/hr for 6 hours (~500c) - Repeat BMP in PM - Microalbumin / Creatinine ratio  Heart Failure with Preserved Ejection Fraction (55-60% 01/2018) BP this AM 102/73. May be due to IV hydralazine he received last night vs worsening of his HFpEF.  Increasing creatinine also concerning for cardiorenal picture. 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  - CPAP qhs  Asthma - Proventil q4hr PRN for wheezing  Diet: NPO DVT prophx: subqHeparin Code: Full  Dispo: Anticipated discharge in approximately 4-5 day(s).   Kenneth Fox, Mignon Bechler K, MD 02/27/2018, 9:53 AM Pager: [phone]

## 2018-02-27 NOTE — Plan of Care (Signed)
  Problem: Education: Goal: Knowledge of General Education information will improve Description Including pain rating scale, medication(s)/side effects and non-pharmacologic comfort measures Outcome: Progressing   Problem: Health Behavior/Discharge Planning: Goal: Ability to manage health-related needs will improve Outcome: Progressing   Problem: Clinical Measurements: Goal: Ability to maintain clinical measurements within normal limits will improve Outcome: Progressing Goal: Will remain free from infection Outcome: Progressing Goal: Respiratory complications will improve Outcome: Progressing   Problem: Activity: Goal: Risk for activity intolerance will decrease Outcome: Progressing   Problem: Nutrition: Goal: Adequate nutrition will be maintained Outcome: Progressing   Problem: Coping: Goal: Level of anxiety will decrease Outcome: Progressing   Problem: Elimination: Goal: Will not experience complications related to bowel motility Outcome: Progressing Goal: Will not experience complications related to urinary retention Outcome: Progressing   Problem: Pain Managment: Goal: General experience of comfort will improve Outcome: Progressing   Problem: Safety: Goal: Ability to remain free from injury will improve Outcome: Progressing   Problem: Skin Integrity: Goal: Risk for impaired skin integrity will decrease Outcome: Progressing Sonny MastersRebecca C Kailana Benninger, RN 02/27/2018 11:54 PM

## 2018-02-27 NOTE — Plan of Care (Signed)
  Problem: Education: Goal: Knowledge of General Education information will improve Description Including pain rating scale, medication(s)/side effects and non-pharmacologic comfort measures Outcome: Progressing   Problem: Health Behavior/Discharge Planning: Goal: Ability to manage health-related needs will improve Outcome: Progressing   Problem: Clinical Measurements: Goal: Ability to maintain clinical measurements within normal limits will improve Outcome: Progressing Goal: Will remain free from infection Outcome: Progressing   Problem: Activity: Goal: Risk for activity intolerance will decrease Outcome: Progressing   Problem: Nutrition: Goal: Adequate nutrition will be maintained Outcome: Progressing   Problem: Coping: Goal: Level of anxiety will decrease Outcome: Progressing   Problem: Elimination: Goal: Will not experience complications related to bowel motility Outcome: Progressing Goal: Will not experience complications related to urinary retention Outcome: Progressing   Problem: Pain Managment: Goal: General experience of comfort will improve Outcome: Progressing   Problem: Safety: Goal: Ability to remain free from injury will improve Outcome: Progressing   Problem: Skin Integrity: Goal: Risk for impaired skin integrity will decrease Outcome: Progressing Sonny Mastersebecca C Yanai Hobson, RN 02/27/2018 2:05 AM

## 2018-02-27 NOTE — Progress Notes (Signed)
Subjective/Chief Complaint: Complains of pain in abd   Objective: Vital signs in last 24 hours: Temp:  [97.6 F (36.4 C)-98.4 F (36.9 C)] 98.3 F (36.8 C) (09/22 0555) Pulse Rate:  [86-101] 101 (09/22 0555) Resp:  [12-18] 16 (09/22 0555) BP: (102-147)/(57-91) 102/73 (09/22 0555) SpO2:  [98 %-100 %] 98 % (09/22 0555) Weight:  [144.5 kg] 144.5 kg (09/21 0941) Last BM Date: 02/26/18  Intake/Output from previous day: 09/21 0701 - 09/22 0700 In: 300 [IV Piggyback:300] Out: 1 [Stool:1] Intake/Output this shift: No intake/output data recorded.  General appearance: alert, cooperative and morbidly obese Resp: clear to auscultation bilaterally Cardio: regular rate and rhythm GI: soft, tender along upper abd  Lab Results:  Recent Labs    02/26/18 1000 02/27/18 0435  WBC 12.5* 10.2  HGB 10.1* 9.5*  HCT 33.1* 31.1*  PLT 361 348   BMET Recent Labs    02/26/18 1617 02/27/18 0435  NA 139 142  K 4.2 3.4*  CL 100 102  CO2 24 28  GLUCOSE 310* 103*  BUN 34* 35*  CREATININE 3.66* 4.17*  CALCIUM 8.2* 8.3*   PT/INR No results for input(s): LABPROT, INR in the last 72 hours. ABG No results for input(s): PHART, HCO3 in the last 72 hours.  Invalid input(s): PCO2, PO2  Studies/Results: [place] Abdomen Complete  Result Date: 02/26/2018 CLINICAL DATA:  47 year old male with abdominal pain. History of cholelithiasis and cholangitis status post extraction of common bile duct stone by ERCP. EXAM: ABDOMEN ULTRASOUND COMPLETE COMPARISON:  Renal ultrasound dated 01/22/2018 and CT of the abdomen dated 01/20/2018 FINDINGS: Evaluation is very limited due to patient's body habitus and difficulty positioning the patient. Gallbladder: The gallbladder is suboptimally visualized and appears to be filled with stone with associated posterior shadowing. The gallbladder wall measures approximately 5 mm in thickness. No pericholecystic fluid. Negative sonographic Murphy's sign. Common bile duct:  Diameter: 8 mm Liver: There is increased liver echogenicity likely a degree of fatty infiltration. The main portal vein is poorly visualized. The visualized portions appear patent with appropriate direction flow. IVC: Not seen Pancreas: Not visualized and obscured by bowel gas. Spleen: Poorly visualized. Right Kidney: Length: 11.1 cm. Echogenicity within normal limits. No mass or hydronephrosis visualized. Left Kidney: Length: Not well seen. Abdominal aorta: Not visualized. Other findings: None. IMPRESSION: Cholelithiasis with mild thickening of the gallbladder wall but no other sonographic evidence acute cholecystitis. Electronically Signed   By: Elgie CollardArash  Radparvar M.D.   On: 02/26/2018 01:28   Ct Renal Stone Study  Result Date: 02/26/2018 CLINICAL DATA:  47 y/o  M; abdominal pain all over. EXAM: CT ABDOMEN AND PELVIS WITHOUT CONTRAST TECHNIQUE: Multidetector CT imaging of the abdomen and pelvis was performed following the standard protocol without IV contrast. COMPARISON:  02/26/2018 abdominal ultrasound. 01/15/2018 CT abdomen and pelvis. FINDINGS: Lower chest: Platelike atelectasis of the lung bases. Hepatobiliary: No focal liver lesion. Mild gallbladder wall thickening and faint pericholecystic edema. Cholelithiasis on ultrasound are radiolucent on CT. No biliary ductal dilatation. Pancreas: Unremarkable. No pancreatic ductal dilatation or surrounding inflammatory changes. Spleen: Normal in size without focal abnormality. Adrenals/Urinary Tract: Adrenal glands are unremarkable. Kidneys are normal, without renal calculi, focal lesion, or hydronephrosis. Bladder is unremarkable. Stomach/Bowel: Stomach is within normal limits. Appendix appears normal. No evidence of bowel wall thickening, distention, or inflammatory changes. Vascular/Lymphatic: No significant vascular findings are present. No enlarged abdominal or pelvic lymph nodes. Reproductive: Unremarkable. Other: Small paraumbilical hernia containing fat.  Musculoskeletal: No fracture is seen. Right posterior  acetabulum and iliac chronic fixed fracture. IMPRESSION: 1. Mild gallbladder wall thickening and faint pericholecystic edema. Cholelithiasis on ultrasound are radiolucent on CT. Findings may represent acute cholecystitis in the appropriate clinical setting. 2. Small paraumbilical hernia containing fat. Electronically Signed   By: Mitzi Hansen M.D.   On: 02/26/2018 02:45    Anti-infectives: Anti-infectives (From admission, onward)   Start     Dose/Rate Route Frequency Ordered Stop   02/27/18 0800  cefTRIAXone (ROCEPHIN) 2 g in sodium chloride 0.9 % 100 mL IVPB     2 g 200 mL/hr over 30 Minutes Intravenous Every 24 hours 02/26/18 0844     02/26/18 1100  metroNIDAZOLE (FLAGYL) IVPB 500 mg     500 mg 100 mL/hr over 60 Minutes Intravenous Every 8 hours 02/26/18 0844     02/26/18 0715  cefTRIAXone (ROCEPHIN) 2 g in sodium chloride 0.9 % 100 mL IVPB     2 g 200 mL/hr over 30 Minutes Intravenous  Once 02/26/18 0702 02/26/18 0758      Assessment/Plan: s/p * No surgery found * Plan for lap chole once medically stable and ready  Continue rocephin and flagyl  LOS: 1 day    TOTH III,Emmalie Haigh S 02/27/2018

## 2018-02-27 NOTE — Progress Notes (Signed)
Pt states that he is unable to use the urinal so we have not been able to get an accurate I/O today.  He did have a BM today and states that he urinated as well although I do not think it was very much.  He states he does not feel distended.  Because of this fact, we were unable to collect the urine specimen needed.

## 2018-02-28 DIAGNOSIS — I5033 Acute on chronic diastolic (congestive) heart failure: Secondary | ICD-10-CM

## 2018-02-28 DIAGNOSIS — E785 Hyperlipidemia, unspecified: Secondary | ICD-10-CM

## 2018-02-28 DIAGNOSIS — E872 Acidosis: Secondary | ICD-10-CM

## 2018-02-28 DIAGNOSIS — R4182 Altered mental status, unspecified: Secondary | ICD-10-CM

## 2018-02-28 LAB — COMPREHENSIVE METABOLIC PANEL
ALBUMIN: 2.6 g/dL — AB (ref 3.5–5.0)
ALK PHOS: 375 U/L — AB (ref 38–126)
ALT: 61 U/L — ABNORMAL HIGH (ref 0–44)
ANION GAP: 11 (ref 5–15)
AST: 36 U/L (ref 15–41)
BUN: 35 mg/dL — AB (ref 6–20)
CALCIUM: 8.1 mg/dL — AB (ref 8.9–10.3)
CO2: 27 mmol/L (ref 22–32)
CREATININE: 4.64 mg/dL — AB (ref 0.61–1.24)
Chloride: 101 mmol/L (ref 98–111)
GFR calc non Af Amer: 14 mL/min — ABNORMAL LOW (ref >60)
GFR, EST AFRICAN AMERICAN: 16 mL/min — AB (ref >60)
Glucose, Bld: 115 mg/dL — ABNORMAL HIGH (ref 70–99)
POTASSIUM: 3.4 mmol/L — AB (ref 3.5–5.1)
SODIUM: 139 mmol/L (ref 135–145)
TOTAL PROTEIN: 6.3 g/dL — AB (ref 6.5–8.1)
Total Bilirubin: 0.6 mg/dL (ref 0.3–1.2)

## 2018-02-28 LAB — URINALYSIS, COMPLETE (UACMP) WITH MICROSCOPIC
Bilirubin Urine: NEGATIVE
Glucose, UA: 50 mg/dL — AB
Ketones, ur: NEGATIVE mg/dL
Leukocytes, UA: NEGATIVE
Nitrite: NEGATIVE
PH: 5 (ref 5.0–8.0)
Protein, ur: >=300 mg/dL — AB
SPECIFIC GRAVITY, URINE: 1.012 (ref 1.005–1.030)

## 2018-02-28 LAB — BLOOD GAS, ARTERIAL
Acid-Base Excess: 2.1 mmol/L — ABNORMAL HIGH (ref 0.0–2.0)
Bicarbonate: 28.1 mmol/L — ABNORMAL HIGH (ref 20.0–28.0)
DRAWN BY: 358491
O2 Content: 3 L/min
O2 Saturation: 98.5 %
PH ART: 7.288 — AB (ref 7.350–7.450)
PO2 ART: 120 mmHg — AB (ref 83.0–108.0)
Patient temperature: 98.6
pCO2 arterial: 60.7 mmHg — ABNORMAL HIGH (ref 32.0–48.0)

## 2018-02-28 LAB — GLUCOSE, CAPILLARY
GLUCOSE-CAPILLARY: 121 mg/dL — AB (ref 70–99)
GLUCOSE-CAPILLARY: 95 mg/dL (ref 70–99)
GLUCOSE-CAPILLARY: 107 mg/dL — AB (ref 70–99)
GLUCOSE-CAPILLARY: 211 mg/dL — AB (ref 70–99)
GLUCOSE-CAPILLARY: 121 mg/dL — AB (ref 70–99)
Glucose-Capillary: 150 mg/dL — ABNORMAL HIGH (ref 70–99)

## 2018-02-28 LAB — CREATININE, SERUM
Creatinine, Ser: 4.53 mg/dL — ABNORMAL HIGH (ref 0.61–1.24)
GFR calc Af Amer: 16 mL/min — ABNORMAL LOW (ref >60)
GFR, EST NON AFRICAN AMERICAN: 14 mL/min — AB (ref >60)

## 2018-02-28 LAB — CBC
HCT: 30.4 % — ABNORMAL LOW (ref 39.0–52.0)
Hemoglobin: 9.2 g/dL — ABNORMAL LOW (ref 13.0–17.0)
MCH: 24.5 pg — ABNORMAL LOW (ref 26.0–34.0)
MCHC: 30.3 g/dL (ref 30.0–36.0)
MCV: 81.1 fL (ref 78.0–100.0)
Platelets: 325 10*3/uL (ref 150–400)
RBC: 3.75 MIL/uL — ABNORMAL LOW (ref 4.22–5.81)
RDW: 16.7 % — AB (ref 11.5–15.5)
WBC: 9.4 10*3/uL (ref 4.0–10.5)

## 2018-02-28 LAB — BILIRUBIN, DIRECT: Bilirubin, Direct: <0.1 mg/dL (ref 0.0–0.2)

## 2018-02-28 MED ORDER — POTASSIUM CHLORIDE CRYS ER 20 MEQ PO TBCR
20.0000 meq | EXTENDED_RELEASE_TABLET | Freq: Once | ORAL | Status: AC
  Administered 2018-02-28: 20 meq via ORAL
  Filled 2018-02-28: qty 1

## 2018-02-28 MED ORDER — FUROSEMIDE 10 MG/ML IJ SOLN
120.0000 mg | Freq: Once | INTRAVENOUS | Status: AC
  Administered 2018-02-28: 120 mg via INTRAVENOUS
  Filled 2018-02-28: qty 10

## 2018-02-28 MED ORDER — FUROSEMIDE 10 MG/ML IJ SOLN
120.0000 mg | Freq: Once | INTRAVENOUS | Status: AC
  Administered 2018-02-28: 120 mg via INTRAVENOUS
  Filled 2018-02-28: qty 2

## 2018-02-28 MED ORDER — PREGABALIN 75 MG PO CAPS
75.0000 mg | ORAL_CAPSULE | Freq: Three times a day (TID) | ORAL | Status: DC
  Administered 2018-02-28 – 2018-03-05 (×16): 75 mg via ORAL
  Filled 2018-02-28 (×16): qty 1

## 2018-02-28 MED ORDER — FUROSEMIDE 80 MG PO TABS
80.0000 mg | ORAL_TABLET | Freq: Every day | ORAL | Status: DC
  Administered 2018-03-01: 80 mg via ORAL
  Filled 2018-02-28: qty 1

## 2018-02-28 MED ORDER — ENOXAPARIN SODIUM 40 MG/0.4ML ~~LOC~~ SOLN
40.0000 mg | SUBCUTANEOUS | Status: DC
  Administered 2018-02-28: 40 mg via SUBCUTANEOUS
  Filled 2018-02-28: qty 0.4

## 2018-02-28 NOTE — Consult Note (Addendum)
Cardiology Consultation:   Patient ID: Kenneth Fox; [id number]; Aug 12, 1970   Admit date: 02/25/2018 Date of Consult: 02/28/2018  Primary Care Provider: Lindaann Pascal, PA-C Primary Cardiologist: Dr Clarene Duke in 2012 Primary Electrophysiologist:  None   Patient Profile:   Kenneth Fox is a 47 y.o. male with a hx of HTN, DM, GERD, OSA, CKD IV w/ hx AKI, morbid obesity, D-CHF, nl MV 2012 w/ EF 49%, nl EF by echo 01/2018, diabetic nephropathy with proteinuria, who is being seen today for the evaluation of NSTEMI at the request of Dr Rogelia Boga.  History of Present Illness:   Kenneth Fox was admitted 08/09-08/19/2019 with Choledocholithiasis with cholangitis, AKI on CKD w/ peak BUN/Cr 63/5.23.  DC BUN/creatinine was 45/3.88.  He was to follow-up with the surgeons after discharge for an outpatient cholecystectomy.  He saw Dr. Lacy Duverney about a week ago and was stable from a heart failure standpoint.  He was admitted 09/21 with abnl LFTs, cholelithiasis without pericholecystic fluid by [place] and acute cholecystitis by CT. Not an acute surgical abdomen.  Started on antibiotics.  Lap chole with IOC planned later this admission.  He was started on IV Lasix to optimize fluid status before surgery.  He received 40 mg IV the morning of 9/21 and 80 mg IV the evening of 9/21.  The Lasix was discontinued because of a bump in his creatinine.  He had 1.9 L of intake on 9/22.  His creatinine was 3.41 on admission, this was suspected to be his new baseline.  His creatinine went from 3.41>>3.66>>4.17>>4.59 today.  His normal dose of Lasix is 80 mg bid. However, he takes 160 mg twice daily when he needs to pull off fluid, he has been doing that a lot recently.  It is unclear if he is on metolazone.  Metolazone was not on his discharge med list from August and not on his admission med list this admission.  Lowest weight recently is 318, down from 355.  He is proud of this, says he has worked hard to make the  dietary changes the doctors want him to make.  He is drinking approximately 2 L of water daily, not drinking sodas anymore.  He wants to get out of the hospital.  He is frustrated because he is still here and has not had surgery.  He is worried because he was told the surgery was high risk.  He cannot walk very far because of musculoskeletal issues and shortness of breath.  He can do some housework, but not very much for the same reasons.  He has help at home, but is able to do some of his self-care.  He does not get chest pain.  He feels his breathing is at baseline.  He feels his shortness of breath is about where it is normally. Chronic orthopnea, no PND because he sleeps w/ O2   Past Medical History:  Diagnosis Date  . Asthma   . CHF (congestive heart failure) (HCC)   . Diabetes (HCC)   . Elevated liver function tests   . Gastroparesis   . GERD (gastroesophageal reflux disease)   . Hypertension   . Kidney disorder   . Neuropathy   . Obesity   . Obstructive sleep apnea    uses CPAP  . Renal insufficiency   . Sickle cell trait [facility])     Past Surgical History:  Procedure Laterality Date  . ENDOSCOPIC RETROGRADE CHOLANGIOPANCREATOGRAPHY (ERCP) WITH PROPOFOL N/A 01/19/2018   Procedure: ENDOSCOPIC RETROGRADE CHOLANGIOPANCREATOGRAPHY (ERCP)  WITH PROPOFOL;  Surgeon: Mansouraty, Netty StarringGabriel Jr., MD;  [facility];  Service: Gastroenterology;  Laterality: N/A;  . HIP FRACTURE SURGERY Right 1999   "put a plate in"  . REMOVAL OF STONES  01/19/2018   Procedure: REMOVAL OF STONES;  Surgeon: Meridee ScoreMansouraty, Netty StarringGabriel Jr., MD;  [facility];  Service: Gastroenterology;;  . Dennison MascotSPHINCTEROTOMY  01/19/2018   Procedure: Dennison MascotSPHINCTEROTOMY;  Surgeon: Mansouraty, Netty StarringGabriel Jr., MD;  [facility];  Service: Gastroenterology;;     Prior to Admission medications   Medication Sig Start Date End Date Taking? Authorizing Provider  acetaminophen (TYLENOL) 500 MG tablet Take 1 tablet (500  mg total) by mouth every 6 (six) hours as needed. Patient taking differently: Take 500 mg by mouth every 6 (six) hours as needed for mild pain or headache.  02/05/17  Yes Law, Waylan BogaAlexandra M, PA-C  furosemide (LASIX) 80 MG tablet Take 1 tablet (80 mg total) by mouth 2 (two) times daily. 01/24/18 02/26/18 Yes Rehman, Areeg N, DO  hydrALAZINE (APRESOLINE) 50 MG tablet Take 1 tablet (50 mg total) by mouth 3 (three) times daily. Patient taking differently: Take 75 mg by mouth 2 (two) times daily.  01/24/18 02/26/18 Yes Rehman, Areeg N, DO  Iron-FA-B Cmp-C-Biot-Probiotic (FUSION PLUS PO) Take 1 capsule by mouth daily.   Yes [provider]  LEVEMIR FLEXTOUCH 100 UNIT/ML Pen Inject 40 Units into the skin 2 (two) times daily. 02/11/18  Yes [provider]  LYRICA 150 MG capsule Take 150 mg by mouth 3 (three) times daily. 02/07/18  Yes [provider]  methocarbamol (ROBAXIN) 500 MG tablet Take 500-1,000 mg by mouth 3 (three) times daily as needed for spasms. 02/01/18  Yes [provider]  NOVOLOG FLEXPEN 100 UNIT/ML FlexPen Inject 10-25 Units into the skin 3 (three) times daily with meals. SLIDING SCALE INSULIN 02/11/18  Yes [provider]  Oxycodone HCl 10 MG TABS Take 10 mg by mouth every 6 (six) hours as needed for pain.   Yes [provider]  Potassium Chloride ER 20 MEQ TBCR Take 20 mEq by mouth daily. 02/06/18  Yes [provider]  VENTOLIN HFA 108 (90 Base) MCG/ACT inhaler Inhale 2 puffs into the lungs every 4 (four) hours as needed for wheezing or shortness of breath. 02/08/18  Yes [provider]  metoCLOPramide (REGLAN) 10 MG tablet Take 0.5 tablets (5 mg total) by mouth every 8 (eight) hours as needed for nausea. Patient not taking: Reported on 02/23/2018 01/08/18   Linwood DibblesKnapp, Jon, MD    Inpatient Medications: Scheduled Meds: . enoxaparin (LOVENOX) injection  40 mg Subcutaneous Q24H  . hydrocerin   Topical BID  . insulin aspart  0-20 Units  Subcutaneous Q4H  . insulin glargine  25 Units Subcutaneous QHS  . pregabalin  75 mg Oral TID   Continuous Infusions: . cefTRIAXone (ROCEPHIN)  IV 2 g (02/28/18 0805)  . metronidazole 500 mg (02/28/18 1351)   PRN Meds: albuterol, HYDROmorphone (DILAUDID) injection, ondansetron **OR** ondansetron (ZOFRAN) IV  Allergies:    Allergies  Allergen Reactions  . Angiotensin Receptor Blockers Other (See Comments)    Acute renal failure in patient w R heart failure  . Lisinopril Other (See Comments)    Patient developed AKI after being on lisinopril for a week.    Social History:   Social History   Socioeconomic History  . Marital status: Married    Spouse name: Not on file  . Number of children: 4  . Years of education: Not  on file  . Highest education level: Not on file  Occupational History  . Occupation: disabled  Social Needs  . Financial resource strain: Not on file  . Food insecurity:    Worry: Not on file    Inability: Not on file  . Transportation needs:    Medical: Not on file    Non-medical: Not on file  Tobacco Use  . Smoking status: Never Smoker  . Smokeless tobacco: Never Used  Substance and Sexual Activity  . Alcohol use: No  . Drug use: No  . Sexual activity: Yes    Partners: Female    Comment: married so no birth control  Lifestyle  . Physical activity:    Days per week: Not on file    Minutes per session: Not on file  . Stress: Not on file  Relationships  . Social connections:    Talks on phone: Not on file    Gets together: Not on file    Attends religious service: Not on file    Active member of club or organization: Not on file    Attends meetings of clubs or organizations: Not on file    Relationship status: Not on file  . Intimate partner violence:    Fear of current or ex partner: Not on file    Emotionally abused: Not on file    Physically abused: Not on file    Forced sexual activity: Not on file  Other Topics Concern  . 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 Out of work for 4 years.     Family History:   Family History  Problem Relation Age of Onset  . Sickle cell anemia Mother   . Heart attack Mother   . Diabetes Father   . Neuropathy Father   . Hypertension Father   . Aneurysm Father   . Kidney disease Brother   . Kidney disease Sister    Family Status:  Family Status  Relation Name Status  . Mother  Deceased  . Father  Deceased  . Brother  Alive  . Brother  Alive  . Sister  Alive  . Sister  Alive  . Son  Alive  . Son  Alive  . Son  Alive  . Daughter  Alive  . Brother  (Not Specified)  . Sister  (Not Specified)    ROS:  Please see the history of present illness.  All other ROS reviewed and negative.     Physical Exam/Data:   Vitals:   02/27/18 1110 02/27/18 1422 02/27/18 1954 02/28/18 0408  BP: 101/61 112/74 131/75 (!) 144/70  Pulse: 97 87 95 97  Resp: (!) 22 18 16 18   Temp: 98.5 F (36.9 C) 99.5 F (37.5 C) 98.1 F (36.7 C) 98.4 F (36.9 C)  TempSrc: Oral Axillary Oral Oral  SpO2: 99% 100% 100% 96%  Weight:    (!) 146.4 kg  Height:        Intake/Output Summary (Last 24 hours) at 02/28/2018 1615 Last data filed at 02/28/2018 1237 Gross per 24 hour  Intake 500 ml  Output 575 ml  Net -75 ml   Filed Weights   02/26/18 0941 02/27/18 1100 02/28/18 0408  Weight: (!) 144.5 kg (!) 145.5 kg (!) 146.4 kg  Body mass index is 45.01 kg/m.  General:  Well nourished, well developed, in no acute distress HEENT: normal Lymph: no adenopathy Neck: no JVD seen, difficult to assess secondary to body habitus Endocrine:  No thryomegaly Vascular: No carotid bruits; upper extremity pulses 2+, lower extremity pulses more difficult to assess but capillary refill is normal Cardiac:  normal S1, S2; RRR; no murmur  Lungs:  Slightly decreased breath sounds bilaterally, but no wheezing, rhonchi or rales  Abd: soft, nontender, no hepatomegaly  Ext: Trace-1+ lower extremity edema Musculoskeletal:  No deformities, BUE and BLE strength normal and equal Skin: warm and dry  Neuro:  CNs 2-12 intact, no focal abnormalities noted Psych:  Normal affect   EKG:  The EKG was personally reviewed and demonstrates: Sinus rhythm, heart rate 97, QT prolonged at 388 ms, no significant change from 01/15/2018 Telemetry:  Telemetry was personally reviewed and demonstrates:  Not on  Relevant CV Studies:  ECHO: 01/15/2018 - Left ventricle: The cavity size was normal. Wall thickness was   increased in a pattern of mild LVH. Systolic function was normal.   The estimated ejection fraction was in the range of 55% to 60%.   Wall motion was normal; there were no regional wall motion   abnormalities.  Impressions:  - Technically difficult; definity used; normal LV systolic   function; mild LVH; mild RVE with RV dysfunction.  PFT: 01/17/2018 Pulmonary Function Diagnosis: Severe Obstructive Airways Disease Insignificant response to bronchodilator Severe Restriction of exhaled volume- may indicate restriction or airtrapping   Laboratory Data:  ABG    Component Value Date/Time   PHART 7.288 (L) 02/28/2018 0838   PCO2ART 60.7 (H) 02/28/2018 0838   PO2ART 120 (H) 02/28/2018 0838   HCO3 28.1 (H) 02/28/2018 0838   TCO2 26 01/10/2018 0933   O2SAT 98.5 02/28/2018 0838   Chemistry Recent Labs  Lab 02/27/18 0435 02/27/18 1344 02/28/18 0505  NA 142 138 139  K 3.4* 3.9 3.4*  CL 102 102 101  CO2 28 24 27   GLUCOSE 103* 200* 115*  BUN 35* 36* 35*  CREATININE 4.17* 4.59* 4.64*  CALCIUM 8.3* 8.3* 8.1*  GFRNONAA 15* 14* 14*  GFRAA 18* 16* 16*  ANIONGAP 12 12 11     Lab Results  Component Value Date   ALT 61 (H) 02/28/2018   AST 36 02/28/2018   ALKPHOS 375 (H) 02/28/2018   BILITOT 0.6 02/28/2018   Hematology Recent  Labs  Lab 02/26/18 1000 02/27/18 0435 02/28/18 0505  WBC 12.5* 10.2 9.4  RBC 4.17* 3.85* 3.75*  HGB 10.1* 9.5* 9.2*  HCT 33.1* 31.1* 30.4*  MCV 79.4 80.8 81.1  MCH 24.2* 24.7* 24.5*  MCHC 30.5 30.5 30.3  RDW 16.3* 16.7* 16.7*  PLT 361 348 325    BNP Recent Labs  Lab 02/27/18 1344  BNP 43.3     TSH:  Lab Results  Component Value Date   TSH 2.329 10/13/2016   HgbA1c: Lab Results  Component Value Date   HGBA1C 8.1 (H) 02/25/2018   Magnesium:  Magnesium  Date Value Ref Range Status  01/24/2018 1.8 1.7 - 2.4 mg/dL Final    Comment:    Performed at [facility], [address] [place], [place] [address]    Radiology/Studies:  Dg Chest 2 View  Result Date: 02/27/2018 CLINICAL DATA:  Heart failure. EXAM: CHEST - 2 VIEW COMPARISON:  01/15/2018. FINDINGS: Cardiac silhouette is mildly enlarged. There is opacity at the right lung base consistent with atelectasis. Pneumonia  should be considered if there are consistent clinical findings. Remainder of the lungs is clear.  No evidence of pulmonary edema. No pleural effusion or pneumothorax. Skeletal structures are intact. IMPRESSION: 1. Right lung base opacity, most likely atelectasis. Consider pneumonia if there are consistent clinical findings. 2. No other evidence of acute cardiopulmonary disease. 3. Mild cardiomegaly. Electronically Signed   By: Amie Portland M.D.   On: 02/27/2018 12:16   US Abdomen Complete  Result Date: 02/26/2018 CLINICAL DATA:  47 year old male with abdominal pain. History of cholelithiasis and cholangitis status post extraction of common bile duct stone by ERCP. EXAM: ABDOMEN ULTRASOUND COMPLETE COMPARISON:  Renal ultrasound dated 01/22/2018 and CT of the abdomen dated 01/20/2018 FINDINGS: Evaluation is very limited due to patient's body habitus and difficulty positioning the patient. Gallbladder: The gallbladder is suboptimally visualized and appears to be filled with stone with associated posterior  shadowing. The gallbladder wall measures approximately 5 mm in thickness. No pericholecystic fluid. Negative sonographic Murphy's sign. Common bile duct: Diameter: 8 mm Liver: There is increased liver echogenicity likely a degree of fatty infiltration. The main portal vein is poorly visualized. The visualized portions appear patent with appropriate direction flow. IVC: Not seen Pancreas: Not visualized and obscured by bowel gas. Spleen: Poorly visualized. Right Kidney: Length: 11.1 cm. Echogenicity within normal limits. No mass or hydronephrosis visualized. Left Kidney: Length: Not well seen. Abdominal aorta: Not visualized. Other findings: None. IMPRESSION: Cholelithiasis with mild thickening of the gallbladder wall but no other sonographic evidence acute cholecystitis. Electronically Signed   By: Elgie Collard M.D.   On: 02/26/2018 01:28   Ct Renal Stone Study  Result Date: 02/26/2018 CLINICAL DATA:  47 y/o  M; abdominal pain all over. EXAM: CT ABDOMEN AND PELVIS WITHOUT CONTRAST TECHNIQUE: Multidetector CT imaging of the abdomen and pelvis was performed following the standard protocol without IV contrast. COMPARISON:  02/26/2018 abdominal ultrasound. 01/15/2018 CT abdomen and pelvis. FINDINGS: Lower chest: Platelike atelectasis of the lung bases. Hepatobiliary: No focal liver lesion. Mild gallbladder wall thickening and faint pericholecystic edema. Cholelithiasis on ultrasound are radiolucent on CT. No biliary ductal dilatation. Pancreas: Unremarkable. No pancreatic ductal dilatation or surrounding inflammatory changes. Spleen: Normal in size without focal abnormality. Adrenals/Urinary Tract: Adrenal glands are unremarkable. Kidneys are normal, without renal calculi, focal lesion, or hydronephrosis. Bladder is unremarkable. Stomach/Bowel: Stomach is within normal limits. Appendix appears normal. No evidence of bowel wall thickening, distention, or inflammatory changes. Vascular/Lymphatic: No significant  vascular findings are present. No enlarged abdominal or pelvic lymph nodes. Reproductive: Unremarkable. Other: Small paraumbilical hernia containing fat. Musculoskeletal: No fracture is seen. Right posterior acetabulum and iliac chronic fixed fracture. IMPRESSION: 1. Mild gallbladder wall thickening and faint pericholecystic edema. Cholelithiasis on ultrasound are radiolucent on CT. Findings may represent acute cholecystitis in the appropriate clinical setting. 2. Small paraumbilical hernia containing fat. Electronically Signed   By: Mitzi Hansen M.D.   On: 02/26/2018 02:45    Assessment and Plan:   1. Acute on chronic diastolic CHF:  - Pt started on IV Lasix 120 mg bid by IM - pt w/ some volume overload by exam, but wt was 326 at d/c 08/19, is 322 today. - R heart cath will help define volume status better  2. Preop cards eval - increased risk of surgery due to multiple medical conditions, but the surgery itself is not high-risk - RCRI is 11%, Duke activity index has DASI score 7.2, w/ functional cap in METS 3.63 - however, pt is young  and surgery is not high risk. - Do not feel cardiac status limits his ability to have surgery.   3. Chronic hypoxia w/ abnl ABG and PFTs - continue home O2 - consider pulm consult  Otherwise, per IM/CCS Active Problems:   Cholecystitis   For questions or updates, please contact [facility] Please consult [email or web address] for contact info under Cardiology/STEMI.   Signed, Theodore Demark, PA-C  02/28/2018 4:15 PM  History and all data above reviewed.  Patient examined.  I agree with the findings as above.  The patient presented with abdominal pain with findings as above.  He might need lap cholecystectomy.  We are called because he has had increased swelling in his legs and feet but there has been difficulty understanding his volume status.  He has had worsening increased creat.  We are called to help with volume status.  He has morbid  obesity, sleep apnea and he is not using his CPAP, lung disease with abnormal PFTs and chronic O2 dependence.  He says that his breathing was at baseline.  He gets around and does not always wearing his O2.  CXR without overt edema and BNP was normal.    The patient exam reveals COR:RRR  ,  Lungs: Decreased breath sounds  ,  Abd: Positive bowel sounds, no rebound no guarding, Ext Mild hand and feet edema    Neck unable to assess his LV function.  .  All available labs, radiology testing, previous records reviewed. Agree with documented assessment and plan.   Edema:  I suspect that he has RV dysfunction and hypoventilation obesity syndrome.  He would be volume dependent.  However, it is impossible from physical exam or from his echo (poor images) to assess pulmonary pressures and volume.  In this situation I think that right heart cath would be helpful.  I discussed this with him and we will work out the timing (tomorrow vs Wed.)  For now I would continue 80 mg PO Lasix.    Fayrene Fearing Chun Sellen  6:46 PM  02/28/2018

## 2018-02-28 NOTE — Progress Notes (Signed)
Patient states that he will place self on CPAP once he is ready for bed. informed patient that if assistance is needed to please let me know.

## 2018-02-28 NOTE — Progress Notes (Signed)
Subjective:  Kenneth Fox is a 47 y.o. with PMH of morbid obesity, OSA, OHS, HTN, HLD, T2DM, CKD, HFpEF admit for cholecystitis on hospital day 2  Kenneth Fox was examined at bedside this AM and was found lying on his right side. He appeared mildly diaphoretic, somnolent and confused. When asked how he was doing, he replied "I feel delirious" however AAOx3. He did not use CPAP yesterday because he did not want equipment on him. Explained to him about importance of getting proper oxygenation and his chronic hypoventilation. Patient agrees to use CPAP tonight. Currently states his pain is well controlled. He endorses weakness in his arms which he attributes to his neuropathy, unable to clarify if it is acute or chronic. He reports of decreased urination. He denied SOB, N, V. Requesting diet as he is tired of drinking broth. Specifically asking for french toast.  Objective: Vital signs in last 24 hours: Vitals:   02/27/18 1110 02/27/18 1422 02/27/18 1954 02/28/18 0408  BP: 101/61 112/74 131/75 (!) 144/70  Pulse: 97 87 95 97  Resp: (!) _0 Temp: 98.5 F (36.9 C) 99.5 F (37.5 C) 98.1 F (36.7 C) 98.4 F (36.9 C)  TempSrc: Oral Axillary Oral Oral  SpO2: 99% 100% 100% 96%  Weight:    (!) 146.4 kg  Height:       Physical Exam  Constitutional: He is oriented to person, place, and time. He appears distressed.  Appears confused. Diaphoretic  HENT:  Head: Normocephalic and atraumatic.  Eyes: Pupils are equal, round, and reactive to light. Conjunctivae and EOM are normal. No scleral icterus.  Neck: Normal range of motion.  Thick neck, difficult to assess JVD  Cardiovascular: Normal rate, regular rhythm, normal heart sounds and intact distal pulses.  No murmur heard. Pulmonary/Chest: Effort normal. He has rales.  Distant breath sounds  Abdominal: Soft. Bowel sounds are normal. He exhibits no distension. There is no tenderness. There is no guarding.  Musculoskeletal: Normal  range of motion. He exhibits edema (2+ pitting edema up to mid shin).  Neurological: He is oriented to person, place, and time. No cranial nerve deficit. GCS score is 15.  Somnolent  Skin: Skin is warm. He is diaphoretic.   Assessment/Plan:  Active Problems:   Cholecystitis [name] is a 47 year old male w/ PMH of morbid obesity, OSA, OHS, HTN, HLD, T2DM, CKD, HFpEF admitted for cholecystitis. After gentle fluid resuscitation yesterday his creatinine was noted to be rising, and he had urine output of only 200cc and his weight rose from 321 to 323lbs. Bladder scan showed no significant retention and this may be due to cardiorenal syndrome. Will restart IV diuretics and monitor I&Os closely. Also appears much more somnolent today. He is continuing to decline CPAP. Will check ABG for acute processes.  Nausea, Vomiting, Abdominal Pain 2/2 likely acute cholecystitis vs pancreatitis Will need cholecystectomy but waiting to optimize CKD and HFpEF first. Won't be going to surgery anytime soon. Will give liquid diet. AST 36 ALT 61 Alk phos 375 Bili 0.6 - Appreciate surgery recs - C/w Ceftriazone 2g daily, Flagyl 561m q8hr - Zofran 451mIV q6 PRN for nausea - Keep NPO  AKI on Chronic Kidney Disease Stage 4 Creatinine this am 4.64<- 4.1 <- 3.80<-3.88  BUN/Cr <15. UA show significant proteinuria. No obvious renal pathology or obstruction on CT. Bladder scan show no retention. Was not on nephrotoxic meds. - UA w/ complete microscopy to look for casts. - Urine Urea -  F/u Microalbumin / Creatinine ratio  Heart Failure with Preserved Ejection Fraction (55-60% 01/2018) BP this AM 144/70. Appear more fluid overloaded. 323lb<-321lb. Increasing creatinine also concerning for cardiorenal picture. X-ray 2 view negative for pulm congestion. BNP 43.3. EKG show no significant change from prior. High risk for surgery (3pts on RCRI). Having difficulty managing volume status. - Cardiology consult - IV Lasix 146m   - Holding home bp med  - Fluid restriction on diet - Daily weights - I&Os  Altered Mental status 2/2 medication side effect vs OHS hypercapnia vs OSA Was on home dose Lyrica 1568mTID but currently having AKI. Has hx of OSA and OHS but refusing CPAP. ABG: pH 7.29, pCO2 60.7, pO2 120, Bicarb 28 - For 20 change in Co2, change of 0.11 in pH: Respiratory Acidosis 2/2 Chronic Hypercapnea - Strongly encourage CPAP use - C/w O2 [place] keep sat above 88 - Decrease Lyrica to 7527mID  Type 2 Diabetes Mellitus Fasting BG 121 - C/w Lantus 25 units qhs - glucose checks - SSI  Asthma - Proventil q4hr PRN for wheezing  DVT prophx: Lovenox Diet: Full liquids Code: Full  Dispo: Anticipated discharge in approximately 4-5 day(s).   LeeMosetta AnisD 02/28/2018, 9:09 AM Pager: [phone]

## 2018-02-28 NOTE — Progress Notes (Addendum)
  Date: 02/28/2018  Patient name: Marya Landryhomas E Ledee  Medical record number: [id number]  Date of birth: [birth year]   I have seen and evaluated this patient and I have discussed the plan of care with the house staff. Please see their note for complete details. I concur with their findings with the following additions/corrections: Mr. [name] was seen on team morning rounds.  He did not use CPAP last night and was groggy and lethargic again today.  ABG reveals a chronic respiratory acidosis.  His creatinine continues to rise.  When he diuresed him, the creatinine rose.  We gave him IV fluids, his creatinine rose.  His postvoid residual does not show any significant urine retention so post renal kidney failure has been ruled out.  Prerenal is definitely a possibility.  His creatinine did not improve with IV fluids but if his cardiac function is severely reduced, he could still have prerenal failure secondary to cardiac dysfunction.  His most recent TTE in August showed an estimated EF of 55 to 60% but the study was overall poor due to body habitus.  We have consulted cardiology and it appears surgery has consulted nephrology.  [facility] review [facility] -he diuresed 10 pounds.  He was discharged on spironolactone 25 daily and Bumex 8 mg 3 times daily.  His Lasix was stopped.  Weight was recorded at 149 kg  Admitted August 5, he was on Lasix 80 twice daily and metolazone 10 daily.  These were held due to AKI.  He was not discharged on any diuretics weight at discharge 152 kg  Admitted August 9 on no diuretics for choledocholithiasis.  Discharged on the 19th on Lasix 80 twice daily.  Discharge weight 147.6 kg  Admitted September 20 and patient reported taking Lasix 80 twice daily  Burns SpainButcher, Melodee Lupe A, MD 02/28/2018, 1:38 PM

## 2018-02-28 NOTE — Progress Notes (Signed)
Bladder scan showing 280 ml in the bladder, ABG results called in to the MD

## 2018-02-28 NOTE — Progress Notes (Signed)
Pt. voided 150 mL in urinal and spilled about 50 mL.  Urine specimen collected and sent to lab.  Kenneth MastersRebecca C Sheera Illingworth, RN 02/28/2018 4:37 AM

## 2018-02-28 NOTE — Progress Notes (Signed)
Patient ID: Kenneth Fox, male   DOB: 1971/04/21, 47 y.o.   MRN: [id number]       Subjective: Pt known to our service for recently admission for choledocholithiasis, s/p ERCP.  He had an attempted Perc chole drain placement by IR, Dr. Fredia SorrowYamagata, but due to body habitus and underdistention of the gallbladder, a drain was unable to be placed.  He states he has been seeing nephrology as an outpatient, [name].  His creatinine had been around 3 as an outpatient and she had just drawn some labs last week.  He wears chronic O2, between 2-3L at home.  He does have severe OSA as well.  Currently this morning he is complaining of an odd sensation in his hands.  He thinks its neuropathy from his DM, but then states it just started because he is on clear liquids.  He denies any abdominal pain at this time.  He hates the clear liquids and is wanting solid food.  Objective: Vital signs in last 24 hours: Temp:  [98.1 F (36.7 C)-99.5 F (37.5 C)] 98.4 F (36.9 C) (09/23 0408) Pulse Rate:  [87-97] 97 (09/23 0408) Resp:  [16-22] 18 (09/23 0408) BP: (101-144)/(61-75) 144/70 (09/23 0408) SpO2:  [96 %-100 %] 96 % (09/23 0408) Weight:  [145.5 kg-146.4 kg] 146.4 kg (09/23 0408) Last BM Date: 02/26/18  Intake/Output from previous day: 09/22 0701 - 09/23 0700 In: 1889.4 [P.O.:784; I.V.:90.9; IV Piggyback:1014.5] Out: 200 [Urine:200] Intake/Output this shift: No intake/output data recorded.  PE: Heart: regular Lungs: CTAB Abd: morbidly obese, +BS, NT  Lab Results:  Recent Labs    02/27/18 0435 02/28/18 0505  WBC 10.2 9.4  HGB 9.5* 9.2*  HCT 31.1* 30.4*  PLT 348 325   BMET Recent Labs    02/27/18 1344 02/28/18 0505  NA 138 139  K 3.9 3.4*  CL 102 101  CO2 24 27  GLUCOSE 200* 115*  BUN 36* 35*  CREATININE 4.59* 4.64*  CALCIUM 8.3* 8.1*   PT/INR No results for input(s): LABPROT, INR in the last 72 hours. CMP     Component Value Date/Time   NA 139 02/28/2018 0505   K  3.4 (L) 02/28/2018 0505   CL 101 02/28/2018 0505   CO2 27 02/28/2018 0505   GLUCOSE 115 (H) 02/28/2018 0505   BUN 35 (H) 02/28/2018 0505   CREATININE 4.64 (H) 02/28/2018 0505   CALCIUM 8.1 (L) 02/28/2018 0505   PROT 6.3 (L) 02/28/2018 0505   ALBUMIN 2.6 (L) 02/28/2018 0505   AST 36 02/28/2018 0505   ALT 61 (H) 02/28/2018 0505   ALKPHOS 375 (H) 02/28/2018 0505   BILITOT 0.6 02/28/2018 0505   GFRNONAA 14 (L) 02/28/2018 0505   GFRAA 16 (L) 02/28/2018 0505   Lipase     Component Value Date/Time   LIPASE 76 (H) 02/25/2018 2316       Studies/Results: Dg Chest 2 View  Result Date: 02/27/2018 CLINICAL DATA:  Heart failure. EXAM: CHEST - 2 VIEW COMPARISON:  01/15/2018. FINDINGS: Cardiac silhouette is mildly enlarged. There is opacity at the right lung base consistent with atelectasis. Pneumonia should be considered if there are consistent clinical findings. Remainder of the lungs is clear.  No evidence of pulmonary edema. No pleural effusion or pneumothorax. Skeletal structures are intact. IMPRESSION: 1. Right lung base opacity, most likely atelectasis. Consider pneumonia if there are consistent clinical findings. 2. No other evidence of acute cardiopulmonary disease. 3. Mild cardiomegaly. Electronically Signed   By: Amie Portlandavid  Ormond  M.D.   On: 02/27/2018 12:16    Anti-infectives: Anti-infectives (From admission, onward)   Start     Dose/Rate Route Frequency Ordered Stop   02/27/18 0800  cefTRIAXone (ROCEPHIN) 2 g in sodium chloride 0.9 % 100 mL IVPB     2 g 200 mL/hr over 30 Minutes Intravenous Every 24 hours 02/26/18 0844     02/26/18 1100  metroNIDAZOLE (FLAGYL) IVPB 500 mg     500 mg 100 mL/hr over 60 Minutes Intravenous Every 8 hours 02/26/18 0844     02/26/18 0715  cefTRIAXone (ROCEPHIN) 2 g in sodium chloride 0.9 % 100 mL IVPB     2 g 200 mL/hr over 30 Minutes Intravenous  Once 02/26/18 9562 02/26/18 0758       Assessment/Plan Biliary colic  -patient continues to have  biliary colic. He was scheduled on9/27 with Dr. Dwain Sarna for OR.  He has returned with worsening of his kidney function again, just like last admission.  He also states his weight is up and he has a history of heart failure.  However, he has a BNP that is normal.   -Agree with cardiology evaluation prior to possible surgical intervention. -he also need nephrology to see him while in the hospital given his persistent elevation of his creatinine.  Surgery puts him at higher risk for an inability to recover his kidney function possibly requiring dialysis. -could consider perc chole drain, but this too has been attempted last admission and failed. -he is not medically cleared for surgery at this time and is a high risk surgical candidate.  Will need to further discuss with Dr. Janee Morn about plans moving forward.  OSA Diastolic CHF Acute on chronic kidney failure Morbid obesity DM HTN  FEN - low fat diet VTE - Lovenox ID - Rocephin -->9/22  Flagyl 9/22-->  Flagyl is not indicated for cholecystitis.  This can be discontinued.   LOS: 2 days    Letha Cape , [facility] Surgery 02/28/2018, 8:11 AM Pager: [phone]

## 2018-03-01 ENCOUNTER — Encounter (HOSPITAL_COMMUNITY)
Admission: EM | Disposition: A | Discharge status: Home Health | Payer: Self-pay | Source: Home / Self Care | Attending: Internal Medicine

## 2018-03-01 DIAGNOSIS — J9622 Acute and chronic respiratory failure with hypercapnia: Secondary | ICD-10-CM

## 2018-03-01 DIAGNOSIS — Z5329 Procedure and treatment not carried out because of patient's decision for other reasons: Secondary | ICD-10-CM

## 2018-03-01 DIAGNOSIS — J9621 Acute and chronic respiratory failure with hypoxia: Secondary | ICD-10-CM

## 2018-03-01 DIAGNOSIS — R0689 Other abnormalities of breathing: Secondary | ICD-10-CM

## 2018-03-01 HISTORY — PX: RIGHT HEART CATH: CATH118263

## 2018-03-01 LAB — COMPREHENSIVE METABOLIC PANEL
ALK PHOS: 482 U/L — AB (ref 38–126)
ALT: 52 U/L — ABNORMAL HIGH (ref 0–44)
AST: 40 U/L (ref 15–41)
Albumin: 2.5 g/dL — ABNORMAL LOW (ref 3.5–5.0)
Anion gap: 10 (ref 5–15)
BUN: 33 mg/dL — ABNORMAL HIGH (ref 6–20)
CALCIUM: 8.3 mg/dL — AB (ref 8.9–10.3)
CO2: 29 mmol/L (ref 22–32)
CREATININE: 4.09 mg/dL — AB (ref 0.61–1.24)
Chloride: 102 mmol/L (ref 98–111)
GFR calc non Af Amer: 16 mL/min — ABNORMAL LOW (ref >60)
GFR, EST AFRICAN AMERICAN: 18 mL/min — AB (ref >60)
Glucose, Bld: 129 mg/dL — ABNORMAL HIGH (ref 70–99)
Potassium: 3.3 mmol/L — ABNORMAL LOW (ref 3.5–5.1)
Sodium: 141 mmol/L (ref 135–145)
Total Bilirubin: 0.5 mg/dL (ref 0.3–1.2)
Total Protein: 6.4 g/dL — ABNORMAL LOW (ref 6.5–8.1)

## 2018-03-01 LAB — POCT I-STAT 3, VENOUS BLOOD GAS (G3P V)
Acid-Base Excess: 1 mmol/L (ref 0.0–2.0)
Acid-Base Excess: 2 mmol/L (ref 0.0–2.0)
BICARBONATE: 29.3 mmol/L — AB (ref 20.0–28.0)
Bicarbonate: 29.8 mmol/L — ABNORMAL HIGH (ref 20.0–28.0)
O2 Saturation: 64 %
O2 Saturation: 65 %
PCO2 VEN: 65.2 mmHg — AB (ref 44.0–60.0)
PH VEN: 7.268 (ref 7.250–7.430)
TCO2: 31 mmol/L (ref 22–32)
TCO2: 32 mmol/L (ref 22–32)
pCO2, Ven: 64 mmHg — ABNORMAL HIGH (ref 44.0–60.0)
pH, Ven: 7.267 (ref 7.250–7.430)
pO2, Ven: 39 mmHg (ref 32.0–45.0)
pO2, Ven: 40 mmHg (ref 32.0–45.0)

## 2018-03-01 LAB — CBC
HCT: 30.4 % — ABNORMAL LOW (ref 39.0–52.0)
Hemoglobin: 9.2 g/dL — ABNORMAL LOW (ref 13.0–17.0)
MCH: 24.4 pg — AB (ref 26.0–34.0)
MCHC: 30.3 g/dL (ref 30.0–36.0)
MCV: 80.6 fL (ref 78.0–100.0)
PLATELETS: 293 10*3/uL (ref 150–400)
RBC: 3.77 MIL/uL — AB (ref 4.22–5.81)
RDW: 16.6 % — ABNORMAL HIGH (ref 11.5–15.5)
WBC: 7.3 10*3/uL (ref 4.0–10.5)

## 2018-03-01 LAB — URINALYSIS, COMPLETE (UACMP) WITH MICROSCOPIC
Bilirubin Urine: NEGATIVE
Glucose, UA: 50 mg/dL — AB
Ketones, ur: NEGATIVE mg/dL
Leukocytes, UA: NEGATIVE
Nitrite: NEGATIVE
Protein, ur: 100 mg/dL — AB
Specific Gravity, Urine: 1.01 (ref 1.005–1.030)
pH: 5 (ref 5.0–8.0)

## 2018-03-01 LAB — GLUCOSE, CAPILLARY
GLUCOSE-CAPILLARY: 156 mg/dL — AB (ref 70–99)
GLUCOSE-CAPILLARY: 167 mg/dL — AB (ref 70–99)
GLUCOSE-CAPILLARY: 239 mg/dL — AB (ref 70–99)
Glucose-Capillary: 142 mg/dL — ABNORMAL HIGH (ref 70–99)
Glucose-Capillary: 120 mg/dL — ABNORMAL HIGH (ref 70–99)
Glucose-Capillary: 109 mg/dL — ABNORMAL HIGH (ref 70–99)

## 2018-03-01 LAB — MICROALBUMIN / CREATININE URINE RATIO
Creatinine, Urine: 206.1 mg/dL
Microalb Creat Ratio: 1886.3 mg/g creat — ABNORMAL HIGH (ref 0.0–30.0)
Microalb, Ur: 3887.7 ug/mL — ABNORMAL HIGH

## 2018-03-01 LAB — UREA NITROGEN, URINE: Urea Nitrogen, Ur: 325 mg/dL

## 2018-03-01 SURGERY — RIGHT HEART CATH
Anesthesia: LOCAL

## 2018-03-01 MED ORDER — POTASSIUM CHLORIDE CRYS ER 20 MEQ PO TBCR
40.0000 meq | EXTENDED_RELEASE_TABLET | Freq: Once | ORAL | Status: AC
  Administered 2018-03-01: 40 meq via ORAL
  Filled 2018-03-01: qty 2

## 2018-03-01 MED ORDER — SODIUM CHLORIDE 0.9 % IV SOLN: 250.0000 mL | INTRAVENOUS | Status: DC | PRN

## 2018-03-01 MED ORDER — FENTANYL CITRATE (PF) 100 MCG/2ML IJ SOLN
INTRAMUSCULAR | Status: AC
  Filled 2018-03-01: qty 2

## 2018-03-01 MED ORDER — LIDOCAINE HCL (PF) 1 % IJ SOLN
INTRAMUSCULAR | Status: DC | PRN
  Administered 2018-03-01: 2 mL

## 2018-03-01 MED ORDER — MIDAZOLAM HCL 2 MG/2ML IJ SOLN
INTRAMUSCULAR | Status: AC
  Filled 2018-03-01: qty 2

## 2018-03-01 MED ORDER — ACETAMINOPHEN 325 MG PO TABS
650.0000 mg | ORAL_TABLET | ORAL | Status: DC | PRN
  Administered 2018-03-04: 650 mg via ORAL
  Filled 2018-03-01: qty 2

## 2018-03-01 MED ORDER — ONDANSETRON HCL 4 MG/2ML IJ SOLN: 4.0000 mg | Freq: Four times a day (QID) | INTRAMUSCULAR | Status: DC | PRN

## 2018-03-01 MED ORDER — LIDOCAINE HCL (PF) 1 % IJ SOLN
INTRAMUSCULAR | Status: AC
  Filled 2018-03-01: qty 30

## 2018-03-01 MED ORDER — ENOXAPARIN SODIUM 40 MG/0.4ML ~~LOC~~ SOLN
40.0000 mg | SUBCUTANEOUS | Status: DC
  Administered 2018-03-02 – 2018-03-05 (×4): 40 mg via SUBCUTANEOUS
  Filled 2018-03-01 (×4): qty 0.4

## 2018-03-01 MED ORDER — SODIUM CHLORIDE 0.9% FLUSH: 3.0000 mL | INTRAVENOUS | Status: DC | PRN

## 2018-03-01 MED ORDER — FUROSEMIDE 10 MG/ML IJ SOLN
120.0000 mg | Freq: Four times a day (QID) | INTRAVENOUS | Status: DC
  Administered 2018-03-01 – 2018-03-05 (×16): 120 mg via INTRAVENOUS
  Filled 2018-03-01: qty 12
  Filled 2018-03-01: qty 10
  Filled 2018-03-01 (×2): qty 12
  Filled 2018-03-01 (×4): qty 10
  Filled 2018-03-01: qty 12
  Filled 2018-03-01: qty 2
  Filled 2018-03-01: qty 10
  Filled 2018-03-01 (×2): qty 12
  Filled 2018-03-01 (×3): qty 10
  Filled 2018-03-01: qty 12

## 2018-03-01 MED ORDER — SODIUM CHLORIDE 0.9 % IV SOLN: INTRAVENOUS | Status: DC

## 2018-03-01 MED ORDER — SODIUM CHLORIDE 0.9% FLUSH
3.0000 mL | Freq: Two times a day (BID) | INTRAVENOUS | Status: DC
  Administered 2018-03-01 – 2018-03-05 (×8): 3 mL via INTRAVENOUS

## 2018-03-01 MED ORDER — FENTANYL CITRATE (PF) 100 MCG/2ML IJ SOLN
INTRAMUSCULAR | Status: DC | PRN
  Administered 2018-03-01: 25 ug via INTRAVENOUS

## 2018-03-01 MED ORDER — FUROSEMIDE 80 MG PO TABS
80.0000 mg | ORAL_TABLET | Freq: Two times a day (BID) | ORAL | Status: DC
  Filled 2018-03-01: qty 1

## 2018-03-01 MED ORDER — HEPARIN (PORCINE) IN NACL 1000-0.9 UT/500ML-% IV SOLN
INTRAVENOUS | Status: DC | PRN
  Administered 2018-03-01: 500 mL

## 2018-03-01 MED ORDER — SODIUM CHLORIDE 0.9% FLUSH
3.0000 mL | Freq: Two times a day (BID) | INTRAVENOUS | Status: DC
  Administered 2018-03-01: 3 mL via INTRAVENOUS

## 2018-03-01 MED ORDER — MIDAZOLAM HCL 2 MG/2ML IJ SOLN
INTRAMUSCULAR | Status: DC | PRN
  Administered 2018-03-01: 1 mg via INTRAVENOUS

## 2018-03-01 MED ORDER — HEPARIN (PORCINE) IN NACL 1000-0.9 UT/500ML-% IV SOLN
INTRAVENOUS | Status: AC
  Filled 2018-03-01: qty 500

## 2018-03-01 SURGICAL SUPPLY — 5 items
CATH BALLN WEDGE 5F 110CM (CATHETERS) ×1 IMPLANT
GUIDEWIRE .025 260CM (WIRE) ×1 IMPLANT
PACK CARDIAC CATHETERIZATION (CUSTOM PROCEDURE TRAY) ×2 IMPLANT
SHEATH GLIDE SLENDER 4/5FR (SHEATH) ×1 IMPLANT
TRANSDUCER W/STOPCOCK (MISCELLANEOUS) ×2 IMPLANT

## 2018-03-01 NOTE — Progress Notes (Signed)
 Progress Note  Patient Name: Kenneth Fox Date of Encounter: 03/01/2018  Primary Cardiologist:   No primary care provider on file.   Subjective   Sleepy.  No chest pain.  Breathing OK.    Inpatient Medications    Scheduled Meds: . furosemide  80 mg Oral Daily  . hydrocerin   Topical BID  . insulin aspart  0-20 Units Subcutaneous Q4H  . insulin glargine  25 Units Subcutaneous QHS  . potassium chloride  40 mEq Oral Once  . pregabalin  75 mg Oral TID  . sodium chloride flush  3 mL Intravenous Q12H   Continuous Infusions: . sodium chloride    . [START ON 03/02/2018] sodium chloride     PRN Meds: sodium chloride, albuterol, HYDROmorphone (DILAUDID) injection, ondansetron **OR** ondansetron (ZOFRAN) IV, sodium chloride flush   Vital Signs    Vitals:   02/28/18 0408 02/28/18 1900 02/28/18 2126 03/01/18 0420  BP: (!) 144/70 129/81 (!) 165/93 (!) 157/87  Pulse: 97 91 89 93  Resp: 18 16 16 16  Temp: 98.4 F (36.9 C) 98 F (36.7 C) 98.4 F (36.9 C) 97.8 F (36.6 C)  TempSrc: Oral Oral Oral Oral  SpO2: 96% 100% 100% 100%  Weight: (!) 146.4 kg   (!) 151.1 kg  Height:        Intake/Output Summary (Last 24 hours) at 03/01/2018 1130 Last data filed at 03/01/2018 0111 Gross per 24 hour  Intake 220 ml  Output 1025 ml  Net -805 ml   Filed Weights   02/27/18 1100 02/28/18 0408 03/01/18 0420  Weight: (!) 145.5 kg (!) 146.4 kg (!) 151.1 kg    Telemetry    NA - Personally Reviewed  ECG    NA - Personally Reviewed  Physical Exam   GEN: No acute distress.   Neck: No  JVD Cardiac: RRR, mp murmurs, rubs, or gallops.  Respiratory: Clear  to auscultation bilaterally. GI: Soft, nontender, non-distended  MS: Mild arm and feet edema; No deformity. Neuro:  Nonfocal  Psych: Normal affect   Labs    Chemistry Recent Labs  Lab 02/27/18 0435 02/27/18 1344 02/28/18 0505 02/28/18 1522 03/01/18 0722  NA 142 138 139  --  141  K 3.4* 3.9 3.4*  --  3.3*  CL 102 102  101  --  102  CO2 28 24 27  --  29  GLUCOSE 103* 200* 115*  --  129*  BUN 35* 36* 35*  --  33*  CREATININE 4.17* 4.59* 4.64* 4.53* 4.09*  CALCIUM 8.3* 8.3* 8.1*  --  8.3*  PROT 6.6  --  6.3*  --  6.4*  ALBUMIN 2.6*  --  2.6*  --  2.5*  AST 41  --  36  --  40  ALT 60*  --  61*  --  52*  ALKPHOS 274*  --  375*  --  482*  BILITOT 0.8  --  0.6  --  0.5  GFRNONAA 15* 14* 14* 14* 16*  GFRAA 18* 16* 16* 16* 18*  ANIONGAP 12 12 11  --  10     Hematology Recent Labs  Lab 02/27/18 0435 02/28/18 0505 03/01/18 0722  WBC 10.2 9.4 7.3  RBC 3.85* 3.75* 3.77*  HGB 9.5* 9.2* 9.2*  HCT 31.1* 30.4* 30.4*  MCV 80.8 81.1 80.6  MCH 24.7* 24.5* 24.4*  MCHC 30.5 30.3 30.3  RDW 16.7* 16.7* 16.6*  PLT 348 325 293    Cardiac EnzymesNo results for   input(s): TROPONINI in the last 168 hours. No results for input(s): TROPIPOC in the last 168 hours.   BNP Recent Labs  Lab 02/27/18 1344  BNP 43.3     DDimer No results for input(s): DDIMER in the last 168 hours.   Radiology    No results found.  Cardiac Studies   Echo 8/10    Study Conclusions  - Left ventricle: The cavity size was normal. Wall thickness was   increased in a pattern of mild LVH. Systolic function was normal.   The estimated ejection fraction was in the range of 55% to 60%.   Wall motion was normal; there were no regional wall motion   abnormalities.  Impressions:  - Technically difficult; definity used; normal LV systolic   function; mild LVH; mild RVE with RV dysfunction.  Patient Profile     47 y.o. male with a hx of HTN, DM, GERD, OSA, CKD IV w/ hx AKI, morbid obesity, D-CHF, nl MV 2012 w/ EF 49%, nl EF by echo 01/2018, diabetic nephropathy with proteinuria, who is being seen for the evaluation of NSTEMI at the request of Dr Butcher.  Assessment & Plan    ACUTE ON CHRONIC DIASTOLIC HF:  Right heart cath today.  See below.  Slightly negative output yesterday.  On PO Lasix but will adjust based on the  pressures.   PREOP:  Patient is higher risk but not prohibitive risk for surgery if needed.  Risk relates to his comorbid non cardiac conditions more than cardiac (obesity, lung disease, sleep apnea, CKD).    CHRONIC HYPOXIA:  Unable to understand his current volume or pulmonary pressures by echo or exam.  Awaiting right heart data to further guide therapy decisions.    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    Needs weight loss, compliance with CPAP.      For questions or updates, please contact [facility] Please consult [email or web address] for contact info under Cardiology/STEMI.   Signed, Orella Cushman, MD  03/01/2018, 11:30 AM    

## 2018-03-01 NOTE — Progress Notes (Signed)
  Date: 03/01/2018  Patient name: Kenneth Fox  Medical record number: [id number]  Date of birth: 1971/04/08   I have seen and evaluated this patient and I have discussed the plan of care with the house staff. Please see their note for complete details. I concur with their findings with the following additions/corrections: Mr. [name] was seen on morning team rounds.  He had just woken up and had not used his CPAP.  He was sedated, lethargic, and falling asleep during our examination.  Appreciate cardiology's assistance and they will take him to the right heart cath today.  His creatinine did come down a bit yesterday with aggressive diuresis.  However, his weights are inaccurate and I do not think his I/O are accurate as he stated he could not hold the urinal and was spilling urine.  Surgery has stated antibiotics are no longer indicated and all have been stopped.  We decrease his Lyrica dose for renal dosing yesterday.  We will wait right heart catheter results.  Burns SpainButcher, Elizabeth A, MD 03/01/2018, 11:27 AM

## 2018-03-01 NOTE — Progress Notes (Signed)
Pt alert and oriented x4, no complaints of pain or discomfort.  Bed in low position, call bell within reach.  Bed alarms on and functioning.  Assessment done and charted.  Will continue to monitor and do hourly rounding throughout the shift 

## 2018-03-01 NOTE — Interval H&P Note (Signed)
History and Physical Interval Note:  03/01/2018 2:33 PM  Kenneth Fox  has presented today for surgery, with the diagnosis of chf  The various methods of treatment have been discussed with the patient and family. After consideration of risks, benefits and other options for treatment, the patient has consented to  Procedure(s): RIGHT HEART CATH (N/A) as a surgical intervention .  The patient's history has been reviewed, patient examined, no change in status, stable for surgery.  I have reviewed the patient's chart and labs.  Questions were answered to the patient's satisfaction.     Mikkel Charrette

## 2018-03-01 NOTE — Progress Notes (Signed)
Subjective:  Kenneth Fox is a 47 y.o. with PMH of morbid obesity, OSA, OHS, HTN, HLD, T2DM, CKD, HFpEF admit for cholecystitis on hospital day 3  Kenneth Fox was examined at bedside this morning and reports that he feels tired. He did not use the CPAP yesterday because he fell asleep before remembering to put it on. He denies shortness of breath but does report of swelling in the left hand and feet. He denies chest pain, nausea, vomiting, abdominal pain and is tolerating po intake appropriately with no difficulties. He understands that he will go for RHC this afternoon.   Objective:  Vital signs in last 24 hours: Vitals:   02/28/18 0408 02/28/18 1900 02/28/18 2126 03/01/18 0420  BP: (!) 144/70 129/81 (!) 165/93 (!) 157/87  Pulse: 97 91 89 93  Resp: 18 16 16 16   Temp: 98.4 F (36.9 C) 98 F (36.7 C) 98.4 F (36.9 C) 97.8 F (36.6 C)  TempSrc: Oral Oral Oral Oral  SpO2: 96% 100% 100% 100%  Weight: (!) 146.4 kg   (!) 151.1 kg  Height:       Physical Exam  Constitutional: He is oriented to person, place, and time.  Somnolent  HENT:  Head: Normocephalic and atraumatic.  Mouth/Throat: Oropharynx is clear and moist. No oropharyngeal exudate.  Eyes: Pupils are equal, round, and reactive to light. Conjunctivae and EOM are normal. No scleral icterus.  Neck: Normal range of motion.  Large neck diameter  Cardiovascular: Normal rate, regular rhythm, normal heart sounds and intact distal pulses.  Pulmonary/Chest: Effort normal and breath sounds normal. He has no wheezes. He has no rales.  Abdominal: Soft. Bowel sounds are normal. He exhibits no distension. There is no tenderness. There is no guarding.  Musculoskeletal: Normal range of motion. He exhibits edema (1+ pitting edema up to mid shin). He exhibits no tenderness or deformity.  Neurological: He is oriented to person, place, and time. GCS score is 15.  Skin: Skin is warm and dry. He is not diaphoretic.    Assessment/Plan:  Active Problems:   Cholecystitis  Kenneth Fox is a 47 year old male w/ PMH of morbid obesity, OSA, OHS, HTN, HLD, T2DM, CKD, HFpEF admitted for cholecystitis. Was started on aggressive diuresis due to concerns about weight gain and edema. Given IV Lasix BID 146m. Weight continues to rise but had 1L output with delta of 300cc. Creatinine downtrended to 4.09 from 4.64. Right heart cath scheduled for this afternoon. Will f/u cath results.  Abdominal Pain 2/2 biliary colic Will need cholecystectomy but waiting to optimize CKD and HFpEF first. Concerning for rising Alk Phos. Currently no longer endorsing abdominal pain. Not yet medically optimized. AST 40 ALT 52 Alk phos 482 Bili 0.5 - Appreciate surgery recs - Stop antibiotics per surgery - Zofran 457mIV q6 PRN for nausea  AKI onChronic Kidney Disease Stage 4 Creatinine this am 4.1<- 4.64 UA show significant proteinuria but no casts. No obvious renal pathology or obstruction on CT. Bladder scan show no retention. - UA w/ complete microscopy to look for casts. - F/u Microalbumin / Creatinine ratio, Urine Urea  Heart Failure with Preserved Ejection Fraction(55-60% 01/2018) BP this AM 157/87. Creatinine improved and appear less fluid overloaded on physical exam after IV Lasix 12041mID yesterday. Delta -300cc. However, weight measured as increasing from 151.1kg <- 146.4kg. - Appreciate cardiology recs - RHC this afternoon - PO Lasix 72m73mr cards for now - Holding home bp med -Daily weights - I&Os  Obstructive  Sleep Apnea  Found to have chronic hypercapnia on ABG yesterday. Patient continuing to decline CPAP use. - Strongly encourage CPAP use - C/w O2 [place] keep sat above 88  Type 2 Diabetes Mellitus Fasting BG 142 -C/wLantus 25 units qhs - glucose checks - SSI  Asthma - Proventil q4hr PRN for wheezing  DVT prophx: Lovenox Diet: Full liquids Code: Full  Dispo: Anticipated discharge in approximately  3 day(s).   Mosetta Anis, MD 03/01/2018, 9:17 AM Pager: [phone]

## 2018-03-01 NOTE — Consult Note (Signed)
Referring Provider: No ref. provider found Primary Care Physician:  Lindaann Pascal, PA-C Primary Nephrologist:  [name]  Reason for Consultation:    HPI: This is a very pleasant gentleman with a history of diastolic dysfunction chronic oxygen dependence type 2 diabetes complicated by peripheral neuropathy gastroparesis.  He has a history of hypertension morbid obesity.  Baseline serum creatinine appears to between be between 3 and 4.  He is followed extensively by [name] at [facility].  It appears that his chronic kidney disease is secondary to diabetic nephropathy.  He has stage IV chronic kidney disease although he does not appear to have been referred for placement of permanent access at this time.  It may be useful to go ahead and get some vein mapping.  He is presented with anasarca and has cholecystitis.  He is being treated conservatively with antibiotics.  And as part of his preop hospital evaluation he is to undergo right heart catheterization by Dr. Antoine Poche.  Nephrology has been asked to become involved due to the severe nature of his chronic kidney disease and also to his apparent volume overloaded state.  Since admission he has had about 2 L in and about 2 L out.  He is morbidly obese and so estimation of dry weight is rather difficult.  He has edema in his feet and hands eyelids and lips.  He appears to be receiving 80 mg of Lasix twice daily orally.  Past Medical History:  Diagnosis Date  . Asthma   . CHF (congestive heart failure) (HCC)   . Diabetes (HCC)   . Elevated liver function tests   . Gastroparesis   . GERD (gastroesophageal reflux disease)   . Hypertension   . Kidney disorder   . Neuropathy   . Obesity   . Obstructive sleep apnea    uses CPAP  . Renal insufficiency   . Sickle cell trait [facility])     Past Surgical History:  Procedure Laterality Date  . ENDOSCOPIC RETROGRADE CHOLANGIOPANCREATOGRAPHY (ERCP) WITH PROPOFOL N/A  01/19/2018   Procedure: ENDOSCOPIC RETROGRADE CHOLANGIOPANCREATOGRAPHY (ERCP) WITH PROPOFOL;  Surgeon: Meridee Score Netty Starring., MD;  Location: [facility] ENDOSCOPY;  Service: Gastroenterology;  Laterality: N/A;  . HIP FRACTURE SURGERY Right 1999   "put a plate in"  . REMOVAL OF STONES  01/19/2018   Procedure: REMOVAL OF STONES;  Surgeon: Meridee Score Netty Starring., MD;  Location: [facility] ENDOSCOPY;  Service: Gastroenterology;;  . Dennison Mascot  01/19/2018   Procedure: Dennison Mascot;  Surgeon: Mansouraty, Netty Starring., MD;  Location: [facility] ENDOSCOPY;  Service: Gastroenterology;;    Prior to Admission medications   Medication Sig Start Date End Date Taking? Authorizing Provider  acetaminophen (TYLENOL) 500 MG tablet Take 1 tablet (500 mg total) by mouth every 6 (six) hours as needed. Patient taking differently: Take 500 mg by mouth every 6 (six) hours as needed for mild pain or headache.  02/05/17  Yes Law, Waylan Boga, PA-C  furosemide (LASIX) 80 MG tablet Take 1 tablet (80 mg total) by mouth 2 (two) times daily. 01/24/18 02/26/18 Yes Rehman, Areeg N, DO  hydrALAZINE (APRESOLINE) 50 MG tablet Take 1 tablet (50 mg total) by mouth 3 (three) times daily. Patient taking differently: Take 75 mg by mouth 2 (two) times daily.  01/24/18 02/26/18 Yes Rehman, Areeg N, DO  Iron-FA-B Cmp-C-Biot-Probiotic (FUSION PLUS PO) Take 1 capsule by mouth daily.   Yes [provider]  LEVEMIR FLEXTOUCH 100 UNIT/ML Pen Inject 40 Units into the skin 2 (two) times daily. 02/11/18  Yes [provider]  LYRICA 150 MG capsule Take 150 mg by mouth 3 (three) times daily. 02/07/18  Yes [provider]  methocarbamol (ROBAXIN) 500 MG tablet Take 500-1,000 mg by mouth 3 (three) times daily as needed for spasms. 02/01/18  Yes [provider]  NOVOLOG FLEXPEN 100 UNIT/ML FlexPen Inject 10-25 Units into the skin 3 (three) times daily with meals. SLIDING SCALE INSULIN 02/11/18  Yes [provider]  Oxycodone HCl 10 MG  TABS Take 10 mg by mouth every 6 (six) hours as needed for pain.   Yes [provider]  Potassium Chloride ER 20 MEQ TBCR Take 20 mEq by mouth daily. 02/06/18  Yes [provider]  VENTOLIN HFA 108 (90 Base) MCG/ACT inhaler Inhale 2 puffs into the lungs every 4 (four) hours as needed for wheezing or shortness of breath. 02/08/18  Yes [provider]  metoCLOPramide (REGLAN) 10 MG tablet Take 0.5 tablets (5 mg total) by mouth every 8 (eight) hours as needed for nausea. Patient not taking: Reported on 02/23/2018 01/08/18   Linwood DibblesKnapp, Jon, MD    Current Facility-Administered Medications  Medication Dose Route Frequency Provider Last Rate Last Dose  . 0.9 %  sodium chloride infusion  250 mL Intravenous PRN Barrett, Joline Salthonda G, PA-C      . [START ON 03/02/2018] 0.9 %  sodium chloride infusion   Intravenous Continuous Barrett, Rhonda G, PA-C      . albuterol (PROVENTIL) (2.5 MG/3ML) 0.083% nebulizer solution 2.5 mg  2.5 mg Nebulization Q4H PRN Beola CordMelvin, Alexander, MD      . furosemide (LASIX) tablet 80 mg  80 mg Oral BID Rollene RotundaHochrein, James, MD      . hydrocerin (EUCERIN) cream   Topical BID Burns SpainButcher, Elizabeth A, MD      . HYDROmorphone (DILAUDID) injection 0.25 mg  0.25 mg Intravenous Q4H PRN Beola CordMelvin, Alexander, MD   0.25 mg at 03/01/18 1300  . insulin aspart (novoLOG) injection 0-20 Units  0-20 Units Subcutaneous Q4H Beola CordMelvin, Alexander, MD   3 Units at 03/01/18 0414  . insulin glargine (LANTUS) injection 25 Units  25 Units Subcutaneous QHS Beola CordMelvin, Alexander, MD   25 Units at 02/28/18 2121  . ondansetron (ZOFRAN) tablet 4 mg  4 mg Oral Q6H PRN Beola CordMelvin, Alexander, MD       Or  . ondansetron [facility](ZOFRAN) injection 4 mg  4 mg Intravenous Q6H PRN Beola CordMelvin, Alexander, MD   4 mg at 02/27/18 [id number]  . potassium chloride SA (K-DUR,KLOR-CON) CR tablet 40 mEq  40 mEq Oral Once Theotis BarrioLee, Joshua K, MD      . pregabalin (LYRICA) capsule 75 mg  75 mg Oral TID Theotis BarrioLee, Joshua K, MD   75 mg at 03/01/18 0951  . sodium chloride  flush (NS) 0.9 % injection 3 mL  3 mL Intravenous Q12H Barrett, Rhonda G, PA-C   3 mL at 03/01/18 0951  . sodium chloride flush (NS) 0.9 % injection 3 mL  3 mL Intravenous PRN Barrett, Rhonda G, PA-C        Allergies as of 02/25/2018 - Review Complete 02/25/2018  Allergen Reaction Noted  . Angiotensin receptor blockers Other (See Comments) 06/04/2015  . Lisinopril Other (See Comments) 12/03/2013    Family History  Problem Relation Age of Onset  . Sickle cell anemia Mother   . Heart attack Mother   . Diabetes Father   . Neuropathy Father   . Hypertension Father   . Aneurysm Father   . Kidney disease Brother   .  Kidney disease Sister     Social History   Socioeconomic History  . Marital status: Married    Spouse name: Not on file  . Number of children: 4  . Years of education: Not on file  . Highest education level: Not on file  Occupational History  . Occupation: disabled  Social Needs  . Financial resource strain: Not on file  . Food insecurity:    Worry: Not on file    Inability: Not on file  . Transportation needs:    Medical: Not on file    Non-medical: Not on file  Tobacco Use  . Smoking status: Never Smoker  . Smokeless tobacco: Never Used  Substance and Sexual Activity  . Alcohol use: No  . Drug use: No  . Sexual activity: Yes    Partners: Female    Comment: married so no birth control  Lifestyle  . Physical activity:    Days per week: Not on file    Minutes per session: Not on file  . Stress: Not on file  Relationships  . Social connections:    Talks on phone: Not on file    Gets together: Not on file    Attends religious service: Not on file    Active member of club or organization: Not on file    Attends meetings of clubs or organizations: Not on file    Relationship status: Not on file  . Intimate partner violence:    Fear of current or ex partner: Not on file    Emotionally abused: Not on file    Physically abused: Not on file    Forced  sexual activity: Not on file  Other Topics Concern  . 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 Out of work for 4 years.     Review of Systems: Gen: Denies any fever, chills, sweats, + anorexia, + fatigue, +  HEENT: No visual complaints, + retinopathy. Normal external appearance No Epistaxis or Sore throat. No sinusitis.   CV: Denies chest pain, no angina, no palpitations, no syncope, + orthopnea, + PND,+++ peripheral edema, and claudication.  Resp: +  dyspnea at rest,+ dyspnea with exercise, No cough, sputum, wheezing, coughing up blood, and pleurisy. GI:  GU : Denies urinary burning, blood in urine, urinary frequency, urinary hesitancy, nocturnal urination, and urinary incontinence.  No renal calculi. MS: Denies joint pain, limitation of movement, and swelling, stiffness, low back pain, extremity pain. Denies muscle weakness, cramps, atrophy.  No use of non steroidal antiinflammatory drugs. Derm: Denies rash, itching, dry skin, hives, moles, warts, or unhealing ulcers.  Psych: Denies depression, anxiety, memory loss, suicidal ideation, hallucinations, paranoia, and confusion. Heme: Denies bruising, bleeding, and enlarged lymph nodes. Neuro: No headache.  No diplopia. No dysarthria.  No dysphasia.  No history of CVA.  No Seizures. No paresthesias.  No weakness. Endocrine No DM.  No Thyroid disease.  No Adrenal disease.  Physical Exam: Vital signs in last 24 hours: Temp:  [97.8 F (36.6 C)-98.4 F (36.9 C)] 97.8 F (36.6 C) (09/24 0420) Pulse Rate:  [89-93] 93 (09/24 0420) Resp:  [16] 16 (09/24 0420) BP: (129-165)/(81-93) 157/87 (09/24 0420) SpO2:  [100 %] 100 % (09/24 0420) Weight:  [151.1 kg] 151.1 kg (09/24 0420) Last BM Date: 02/28/18 General:  Obese ill-appearing Head:  Normocephalic and  atraumatic. Eyes:  Sclera clear, no icterus.   Conjunctiva pink.  Periorbital edema Ears:  Normal auditory acuity. Nose:  No deformity, discharge,  or lesions. Mouth:  No deformity or lesions, dentition normal. Lips edematous Neck:  Supple; no masses or thyromegaly. JVP not elevated Lungs: Diminished throughout with rhonchorous breath sounds no wheezes no rales Heart:  Regular rate and rhythm; no murmurs, clicks, rubs,  or gallops. Abdomen:  Obese non tender  Bowel sounds normal  Msk:  Symmetrical without gross deformities. Normal posture. Pulses:  No carotid, renal, femoral bruits. DP and PT symmetrical and equal Extremities:  2 + edema noted throughout  Neurologic:  Alert and  oriented x4;  grossly normal neurologically. Skin:  Intact without significant lesions or rashes. Cervical Nodes:  No significant cervical adenopathy. Psych:  Alert and cooperative. Normal mood and affect.  Intake/Output from previous day: 09/23 0701 - 09/24 0700 In: 720 [P.O.:720] Out: 1025 [Urine:1025] Intake/Output this shift: No intake/output data recorded.  Lab Results: Recent Labs    02/27/18 0435 02/28/18 0505 03/01/18 0722  WBC 10.2 9.4 7.3  HGB 9.5* 9.2* 9.2*  HCT 31.1* 30.4* 30.4*  PLT 348 325 293   BMET Recent Labs    02/27/18 1344 02/28/18 0505 02/28/18 1522 03/01/18 0722  NA 138 139  --  141  K 3.9 3.4*  --  3.3*  CL 102 101  --  102  CO2 24 27  --  29  GLUCOSE 200* 115*  --  129*  BUN 36* 35*  --  33*  CREATININE 4.59* 4.64* 4.53* 4.09*  CALCIUM 8.3* 8.1*  --  8.3*   LFT Recent Labs    02/28/18 0505 03/01/18 0722  PROT 6.3* 6.4*  ALBUMIN 2.6* 2.5*  AST 36 40  ALT 61* 52*  ALKPHOS 375* 482*  BILITOT 0.6 0.5  BILIDIR <0.1  --    PT/INR No results for input(s): LABPROT, INR in the last 72 hours. Hepatitis Panel No results for input(s): HEPBSAG, HCVAB, HEPAIGM, HEPBIGM in the last 72 hours.  Studies/Results: No results found.  Assessment/Plan:  Chronic renal  failure stage IV secondary to diabetes and diabetic nephropathy.  Patient appears to have a serum creatinine that ranges anywhere from about 3.5-4.5 this appears to be baseline for him.  I believe we should obtain some vein mapping although there is no urgent need for dialysis at this time.  Patient was admitted with acute cholecystitis this seems to have resolved although he is scheduled for laparoscopic cholecystectomy.  This is been placed on hold until his medical issues have resolved.  He is scheduled for right heart catheterization to evaluate his right heart function and pulmonary artery pressures as well as left ventricular filling pressures.  This does not involve the use of of contrast.  I would continue to avoid nephrotoxins such as ACE inhibitors, ARB's, Cox 2 inhibitors and nonsteroidal anti-inflammatory drugs.  We shall follow serial creatinines  Anasarca -I believe that we should increase his diuresis and will increase his Lasix to 120 mg every 8 hours.  He will be interesting to see the results of his heart catheterization and evaluate his fluid status better.  This is difficult to assess due to his morbid obesity  Biliary colic with acute cholecystitis.  Followed by surgery  Diastolic heart failure appreciate input from Dr. Antoine Poche  Obstructive sleep apnea encouraging CPAP use  Diabetes mellitus BiPAP per primary team  Hypertension avoid the use  of ACE and ARB's      LOS: 3 [name] @TODAY @1 :21 PM

## 2018-03-01 NOTE — H&P (View-Only) (Signed)
Progress Note  Patient Name: Kenneth Fox Date of Encounter: 03/01/2018  Primary Cardiologist:   No primary care provider on file.   Subjective   Sleepy.  No chest pain.  Breathing OK.    Inpatient Medications    Scheduled Meds: . furosemide  80 mg Oral Daily  . hydrocerin   Topical BID  . insulin aspart  0-20 Units Subcutaneous Q4H  . insulin glargine  25 Units Subcutaneous QHS  . potassium chloride  40 mEq Oral Once  . pregabalin  75 mg Oral TID  . sodium chloride flush  3 mL Intravenous Q12H   Continuous Infusions: . sodium chloride    . [START ON 03/02/2018] sodium chloride     PRN Meds: sodium chloride, albuterol, HYDROmorphone (DILAUDID) injection, ondansetron **OR** ondansetron (ZOFRAN) IV, sodium chloride flush   Vital Signs    Vitals:   02/28/18 0408 02/28/18 1900 02/28/18 2126 03/01/18 0420  BP: (!) 144/70 129/81 (!) 165/93 (!) 157/87  Pulse: 97 91 89 93  Resp: 18 16 16 16   Temp: 98.4 F (36.9 C) 98 F (36.7 C) 98.4 F (36.9 C) 97.8 F (36.6 C)  TempSrc: Oral Oral Oral Oral  SpO2: 96% 100% 100% 100%  Weight: (!) 146.4 kg   (!) 151.1 kg  Height:        Intake/Output Summary (Last 24 hours) at 03/01/2018 1130 Last data filed at 03/01/2018 0111 Gross per 24 hour  Intake 220 ml  Output 1025 ml  Net -805 ml   Filed Weights   02/27/18 1100 02/28/18 0408 03/01/18 0420  Weight: (!) 145.5 kg (!) 146.4 kg (!) 151.1 kg    Telemetry    NA - Personally Reviewed  ECG    NA - Personally Reviewed  Physical Exam   GEN: No acute distress.   Neck: No  JVD Cardiac: RRR, mp murmurs, rubs, or gallops.  Respiratory: Clear  to auscultation bilaterally. GI: Soft, nontender, non-distended  MS: Mild arm and feet edema; No deformity. Neuro:  Nonfocal  Psych: Normal affect   Labs    Chemistry Recent Labs  Lab 02/27/18 0435 02/27/18 1344 02/28/18 0505 02/28/18 1522 03/01/18 0722  NA 142 138 139  --  141  K 3.4* 3.9 3.4*  --  3.3*  CL 102 102  101  --  102  CO2 28 24 27   --  29  GLUCOSE 103* 200* 115*  --  129*  BUN 35* 36* 35*  --  33*  CREATININE 4.17* 4.59* 4.64* 4.53* 4.09*  CALCIUM 8.3* 8.3* 8.1*  --  8.3*  PROT 6.6  --  6.3*  --  6.4*  ALBUMIN 2.6*  --  2.6*  --  2.5*  AST 41  --  36  --  40  ALT 60*  --  61*  --  52*  ALKPHOS 274*  --  375*  --  482*  BILITOT 0.8  --  0.6  --  0.5  GFRNONAA 15* 14* 14* 14* 16*  GFRAA 18* 16* 16* 16* 18*  ANIONGAP 12 12 11   --  10     Hematology Recent Labs  Lab 02/27/18 0435 02/28/18 0505 03/01/18 0722  WBC 10.2 9.4 7.3  RBC 3.85* 3.75* 3.77*  HGB 9.5* 9.2* 9.2*  HCT 31.1* 30.4* 30.4*  MCV 80.8 81.1 80.6  MCH 24.7* 24.5* 24.4*  MCHC 30.5 30.3 30.3  RDW 16.7* 16.7* 16.6*  PLT 348 325 293    Cardiac EnzymesNo results for  input(s): TROPONINI in the last 168 hours. No results for input(s): TROPIPOC in the last 168 hours.   BNP Recent Labs  Lab 02/27/18 1344  BNP 43.3     DDimer No results for input(s): DDIMER in the last 168 hours.   Radiology    No results found.  Cardiac Studies   Echo 8/10    Study Conclusions  - Left ventricle: The cavity size was normal. Wall thickness was   increased in a pattern of mild LVH. Systolic function was normal.   The estimated ejection fraction was in the range of 55% to 60%.   Wall motion was normal; there were no regional wall motion   abnormalities.  Impressions:  - Technically difficult; definity used; normal LV systolic   function; mild LVH; mild RVE with RV dysfunction.  Patient Profile     48 y.o. male with a hx of HTN, DM, GERD, OSA, CKD IV w/ hx AKI, morbid obesity, D-CHF, nl MV 2012 w/ EF 49%, nl EF by echo 01/2018, diabetic nephropathy with proteinuria, who is being seen for the evaluation of NSTEMI at the request of Dr Rogelia Boga.  Assessment & Plan    ACUTE ON CHRONIC DIASTOLIC HF:  Right heart cath today.  See below.  Slightly negative output yesterday.  On PO Lasix but will adjust based on the  pressures.   PREOP:  Patient is higher risk but not prohibitive risk for surgery if needed.  Risk relates to his comorbid non cardiac conditions more than cardiac (obesity, lung disease, sleep apnea, CKD).    CHRONIC HYPOXIA:  Unable to understand his current volume or pulmonary pressures by echo or exam.  Awaiting right heart data to further guide therapy decisions.    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    Needs weight loss, compliance with CPAP.      For questions or updates, please contact [facility] Please consult [email or web address] for contact info under Cardiology/STEMI.   Signed, Rollene Rotunda, MD  03/01/2018, 11:30 AM

## 2018-03-01 NOTE — Progress Notes (Signed)
Pt left for cardiac cath. 

## 2018-03-01 NOTE — Research (Signed)
PHDE Informed Consent   Subject Name: Kenneth Fox  Subject met inclusion and exclusion criteria.  The informed consent form, study requirements and expectations were reviewed with the subject and questions and concerns were addressed prior to the signing of the consent form.  The subject verbalized understanding of the trail requirements.  The subject agreed to participate in the PHDE trial and signed the informed consent.  The informed consent was obtained prior to performance of any protocol-specific procedures for the subject.  A copy of the signed informed consent was given to the subject and a copy was placed in the subject's medical record.  [name] 03/01/2018, 11:50 AM

## 2018-03-01 NOTE — Progress Notes (Signed)
Patient ID: Kenneth Fox, male   DOB: 04/06/1971, 47 y.o.   MRN: [id number]       Subjective: Awoke from sleep.  No complaints.  For right heart cath today.  Objective: Vital signs in last 24 hours: Temp:  [97.8 F (36.6 C)-98.4 F (36.9 C)] 97.8 F (36.6 C) (09/24 0420) Pulse Rate:  [89-93] 93 (09/24 0420) Resp:  [16] 16 (09/24 0420) BP: (129-165)/(81-93) 157/87 (09/24 0420) SpO2:  [100 %] 100 % (09/24 0420) Weight:  [151.1 kg] 151.1 kg (09/24 0420) Last BM Date: 02/28/18  Intake/Output from previous day: 09/23 0701 - 09/24 0700 In: 720 [P.O.:720] Out: 1025 [Urine:1025] Intake/Output this shift: No intake/output data recorded.  PE: Abd: soft, NT, ND, morbidly obese   Lab Results:  Recent Labs    02/27/18 0435 02/28/18 0505  WBC 10.2 9.4  HGB 9.5* 9.2*  HCT 31.1* 30.4*  PLT 348 325   BMET Recent Labs    02/27/18 1344 02/28/18 0505 02/28/18 1522  NA 138 139  --   K 3.9 3.4*  --   CL 102 101  --   CO2 24 27  --   GLUCOSE 200* 115*  --   BUN 36* 35*  --   CREATININE 4.59* 4.64* 4.53*  CALCIUM 8.3* 8.1*  --    PT/INR No results for input(s): LABPROT, INR in the last 72 hours. CMP     Component Value Date/Time   NA 139 02/28/2018 0505   K 3.4 (L) 02/28/2018 0505   CL 101 02/28/2018 0505   CO2 27 02/28/2018 0505   GLUCOSE 115 (H) 02/28/2018 0505   BUN 35 (H) 02/28/2018 0505   CREATININE 4.53 (H) 02/28/2018 1522   CALCIUM 8.1 (L) 02/28/2018 0505   PROT 6.3 (L) 02/28/2018 0505   ALBUMIN 2.6 (L) 02/28/2018 0505   AST 36 02/28/2018 0505   ALT 61 (H) 02/28/2018 0505   ALKPHOS 375 (H) 02/28/2018 0505   BILITOT 0.6 02/28/2018 0505   GFRNONAA 14 (L) 02/28/2018 1522   GFRAA 16 (L) 02/28/2018 1522   Lipase     Component Value Date/Time   LIPASE 76 (H) 02/25/2018 2316       Studies/Results: Dg Chest 2 View  Result Date: 02/27/2018 CLINICAL DATA:  Heart failure. EXAM: CHEST - 2 VIEW COMPARISON:  01/15/2018. FINDINGS: Cardiac silhouette is  mildly enlarged. There is opacity at the right lung base consistent with atelectasis. Pneumonia should be considered if there are consistent clinical findings. Remainder of the lungs is clear.  No evidence of pulmonary edema. No pleural effusion or pneumothorax. Skeletal structures are intact. IMPRESSION: 1. Right lung base opacity, most likely atelectasis. Consider pneumonia if there are consistent clinical findings. 2. No other evidence of acute cardiopulmonary disease. 3. Mild cardiomegaly. Electronically Signed   By: Amie Portland M.D.   On: 02/27/2018 12:16    Anti-infectives: Anti-infectives (From admission, onward)   Start     Dose/Rate Route Frequency Ordered Stop   02/27/18 0800  cefTRIAXone (ROCEPHIN) 2 g in sodium chloride 0.9 % 100 mL IVPB     2 g 200 mL/hr over 30 Minutes Intravenous Every 24 hours 02/26/18 0844     02/26/18 1100  metroNIDAZOLE (FLAGYL) IVPB 500 mg     500 mg 100 mL/hr over 60 Minutes Intravenous Every 8 hours 02/26/18 0844     02/26/18 0715  cefTRIAXone (ROCEPHIN) 2 g in sodium chloride 0.9 % 100 mL IVPB     2 g 200 mL/hr  over 30 Minutes Intravenous  Once 02/26/18 [id number] 02/26/18 0758       Assessment/Plan Biliary colic, chronic cholecystitis -right heart cath today per cardiology -nephro consult pending given worsening kidney function -await medical work up before determining further plans regarding his gallbladder. -flagyl is not needed for coverage of the gallbladder.  This will be stopped.  Not sure of if or how long he truly needs rocephin at this point either.  OSA Diastolic CHF Acute on chronic kidney failure Morbid obesity DM HTN  FEN - NPO for cath VTE - Lovenox ID - Rocephin -->9/22  Flagyl 9/22--> 9/24   LOS: 3 days    Letha CapeKelly E Deetta Siegmann , [facility] 03/01/2018, 7:50 AM Pager: [phone]

## 2018-03-01 NOTE — Progress Notes (Signed)
Pt returned to room,  No complaints of pain or discomfort.

## 2018-03-02 ENCOUNTER — Encounter (HOSPITAL_COMMUNITY): Payer: Self-pay | Admitting: Internal Medicine

## 2018-03-02 ENCOUNTER — Telehealth: Payer: Self-pay | Admitting: Internal Medicine

## 2018-03-02 ENCOUNTER — Other Ambulatory Visit (HOSPITAL_COMMUNITY): Payer: Self-pay | Admitting: *Deleted

## 2018-03-02 DIAGNOSIS — I509 Heart failure, unspecified: Secondary | ICD-10-CM

## 2018-03-02 DIAGNOSIS — I272 Pulmonary hypertension, unspecified: Secondary | ICD-10-CM

## 2018-03-02 DIAGNOSIS — D649 Anemia, unspecified: Secondary | ICD-10-CM

## 2018-03-02 DIAGNOSIS — N185 Chronic kidney disease, stage 5: Secondary | ICD-10-CM

## 2018-03-02 DIAGNOSIS — Z9119 Patient's noncompliance with other medical treatment and regimen: Secondary | ICD-10-CM

## 2018-03-02 LAB — CBC
HCT: 28.2 % — ABNORMAL LOW (ref 39.0–52.0)
HEMOGLOBIN: 8.7 g/dL — AB (ref 13.0–17.0)
MCH: 24.9 pg — AB (ref 26.0–34.0)
MCHC: 30.9 g/dL (ref 30.0–36.0)
MCV: 80.6 fL (ref 78.0–100.0)
Platelets: 278 10*3/uL (ref 150–400)
RBC: 3.5 MIL/uL — AB (ref 4.22–5.81)
RDW: 16.6 % — ABNORMAL HIGH (ref 11.5–15.5)
WBC: 8.7 10*3/uL (ref 4.0–10.5)

## 2018-03-02 LAB — COMPREHENSIVE METABOLIC PANEL
ALK PHOS: 362 U/L — AB (ref 38–126)
ALT: 35 U/L (ref 0–44)
AST: 20 U/L (ref 15–41)
Albumin: 2.5 g/dL — ABNORMAL LOW (ref 3.5–5.0)
Anion gap: 10 (ref 5–15)
BUN: 31 mg/dL — ABNORMAL HIGH (ref 6–20)
CALCIUM: 8.1 mg/dL — AB (ref 8.9–10.3)
CO2: 29 mmol/L (ref 22–32)
CREATININE: 3.56 mg/dL — AB (ref 0.61–1.24)
Chloride: 102 mmol/L (ref 98–111)
GFR, EST AFRICAN AMERICAN: 22 mL/min — AB (ref >60)
GFR, EST NON AFRICAN AMERICAN: 19 mL/min — AB (ref >60)
Glucose, Bld: 157 mg/dL — ABNORMAL HIGH (ref 70–99)
Potassium: 3.7 mmol/L (ref 3.5–5.1)
SODIUM: 141 mmol/L (ref 135–145)
Total Bilirubin: 0.5 mg/dL (ref 0.3–1.2)
Total Protein: 6.2 g/dL — ABNORMAL LOW (ref 6.5–8.1)

## 2018-03-02 LAB — GLUCOSE, CAPILLARY
GLUCOSE-CAPILLARY: 146 mg/dL — AB (ref 70–99)
GLUCOSE-CAPILLARY: 159 mg/dL — AB (ref 70–99)
GLUCOSE-CAPILLARY: 161 mg/dL — AB (ref 70–99)
GLUCOSE-CAPILLARY: 167 mg/dL — AB (ref 70–99)
GLUCOSE-CAPILLARY: 175 mg/dL — AB (ref 70–99)
Glucose-Capillary: 192 mg/dL — ABNORMAL HIGH (ref 70–99)

## 2018-03-02 MED ORDER — INSULIN ASPART 100 UNIT/ML ~~LOC~~ SOLN
0.0000 [IU] | Freq: Three times a day (TID) | SUBCUTANEOUS | Status: DC
  Administered 2018-03-02 – 2018-03-04 (×5): 4 [IU] via SUBCUTANEOUS
  Administered 2018-03-04: 3 [IU] via SUBCUTANEOUS
  Administered 2018-03-04: 4 [IU] via SUBCUTANEOUS
  Administered 2018-03-05 (×2): 3 [IU] via SUBCUTANEOUS

## 2018-03-02 NOTE — Progress Notes (Signed)
1 Day Post-Op   Subjective/Chief Complaint: Up in chair, CPAP. No abdominal pain.   Objective: Vital signs in last 24 hours: Temp:  [98 F (36.7 C)-98.3 F (36.8 C)] 98 F (36.7 C) (09/25 0433) Pulse Rate:  [68-93] 86 (09/25 0433) Resp:  [12-21] 18 (09/25 0433) BP: (126-193)/(70-109) 141/70 (09/25 0433) SpO2:  [98 %-100 %] 98 % (09/25 0433) Weight:  [152.1 kg] 152.1 kg (09/25 0433) Last BM Date: 03/01/18  Intake/Output from previous day: 09/24 0701 - 09/25 0700 In: 470 [P.O.:420; IV Piggyback:50] Out: 2575 [Urine:2575] Intake/Output this shift: No intake/output data recorded.  General appearance: cooperative Resp: rales bilaterally Cardio: regular rate and rhythm GI: soft, nontender  Lab Results:  Recent Labs    03/01/18 0722 03/02/18 0515  WBC 7.3 8.7  HGB 9.2* 8.7*  HCT 30.4* 28.2*  PLT 293 278   BMET Recent Labs    03/01/18 0722 03/02/18 0515  NA 141 141  K 3.3* 3.7  CL 102 102  CO2 29 29  GLUCOSE 129* 157*  BUN 33* 31*  CREATININE 4.09* 3.56*  CALCIUM 8.3* 8.1*   PT/INR No results for input(s): LABPROT, INR in the last 72 hours. ABG Recent Labs    02/28/18 0838 03/01/18 1453  PHART 7.288*  --   HCO3 28.1* 29.3*  29.8*    Studies/Results: No results found.  Anti-infectives: Anti-infectives (From admission, onward)   Start     Dose/Rate Route Frequency Ordered Stop   02/27/18 0800  cefTRIAXone (ROCEPHIN) 2 g in sodium chloride 0.9 % 100 mL IVPB  Status:  Discontinued     2 g 200 mL/hr over 30 Minutes Intravenous Every 24 hours 02/26/18 0844 03/01/18 1043   02/26/18 1100  metroNIDAZOLE (FLAGYL) IVPB 500 mg  Status:  Discontinued     500 mg 100 mL/hr over 60 Minutes Intravenous Every 8 hours 02/26/18 0844 03/01/18 0752   02/26/18 0715  cefTRIAXone (ROCEPHIN) 2 g in sodium chloride 0.9 % 100 mL IVPB     2 g 200 mL/hr over 30 Minutes Intravenous  Once 02/26/18 [id number] 02/26/18 0758      Assessment/Plan: Biliary colic, chronic  cholecystitis - now asymptomatic. Off ABX. Very volume overloaded. Appreciate Dr. Marland McalpineWebb's eval and we agree to hold off on surgery for now. Noted plan for vein mapping, etc. - Please have patient see Dr. Dwain SarnaWakefield at [facility] again with hopes to reschedule lap chole when medically feasible  OSA Diastolic CHF Acute on chronic kidney failure Morbid obesity DM HTN   LOS: 4 days    Liz MaladyBurke E Annamae Shivley 03/02/2018

## 2018-03-02 NOTE — Consult Note (Signed)
[facility] [place]  Patient name: Kenneth Fox MRN: [id number] DOB: 1971-01-13 Sex: male  REASON FOR VISIT: Discuss access for hemodialysis  HPI: Kenneth Fox is a 47 y.o. male with multiple medical problems.  He has chronic renal insufficiency.  Morbid obesity.  Congestive heart failure.  Admitted with cholecystitis.  Is felt to be high risk for surgery and is being currently treated with antibiotics.  Has had progressive renal insufficiency.  He has had successful diuresis of his volume overload.  We have been consulted to discuss access.  Patient is very upset about the discussion.  He knows multiple family members have been on dialysis and he is quite hesitant to consider this.  I explained that access would not cause any sooner dialysis or delay this but hopefully would be better poor position to have acceptable access should he go on to dialysis.  He is never had any access before.  Is not had a pacemaker.  Is not on anticoagulation.  Past Medical History:  Diagnosis Date  . Asthma   . CHF (congestive heart failure) (HCC)   . Diabetes (HCC)   . Elevated liver function tests   . Gastroparesis   . GERD (gastroesophageal reflux disease)   . Hypertension   . Kidney disorder   . Neuropathy   . Obesity   . Obstructive sleep apnea    uses CPAP  . Renal insufficiency   . Sickle cell trait (HCC)     Family History  Problem Relation Age of Onset  . Sickle cell anemia Mother   . Heart attack Mother   . Diabetes Father   . Neuropathy Father   . Hypertension Father   . Aneurysm Father   . Kidney disease Brother   . Kidney disease Sister     SOCIAL HISTORY: Social History   Tobacco Use  . Smoking status: Never Smoker  . Smokeless tobacco: Never Used  Substance Use Topics  . Alcohol use: No    Allergies  Allergen Reactions  . Angiotensin Receptor Blockers Other (See Comments)    Acute renal failure in  patient w R heart failure  . Lisinopril Other (See Comments)    Patient developed AKI after being on lisinopril for a week.    Current Facility-Administered Medications  Medication Dose Route Frequency Provider Last Rate Last Dose  . 0.9 %  sodium chloride infusion  250 mL Intravenous PRN Bensimhon, Bevelyn Buckles, MD      . acetaminophen (TYLENOL) tablet 650 mg  650 mg Oral Q4H PRN Bensimhon, Bevelyn Buckles, MD      . albuterol (PROVENTIL) (2.5 MG/3ML) 0.083% nebulizer solution 2.5 mg  2.5 mg Nebulization Q4H PRN Bensimhon, Bevelyn Buckles, MD      . enoxaparin (LOVENOX) injection 40 mg  40 mg Subcutaneous Q24H Bensimhon, Bevelyn Buckles, MD   40 mg at 03/02/18 0825  . furosemide (LASIX) 120 mg in dextrose 5 % 50 mL IVPB  120 mg Intravenous Q6H Bensimhon, Bevelyn Buckles, MD 62 mL/hr at 03/02/18 1542 120 mg at 03/02/18 1542  . hydrocerin (EUCERIN) cream   Topical BID Bensimhon, Bevelyn Buckles, MD      . HYDROmorphone (DILAUDID) injection 0.25 mg  0.25 mg Intravenous Q4H PRN Bensimhon, Bevelyn Buckles, MD   0.25 mg at 03/02/18 1547  . insulin aspart (novoLOG) injection 0-20 Units  0-20 Units Subcutaneous Q4H Bensimhon, Bevelyn Buckles, MD   4 Units at 03/02/18 1406  . insulin glargine (LANTUS) injection  25 Units  25 Units Subcutaneous QHS Bensimhon, Bevelyn Buckles, MD   25 Units at 03/01/18 2138  . ondansetron (ZOFRAN) tablet 4 mg  4 mg Oral Q6H PRN Bensimhon, Bevelyn Buckles, MD       Or  . ondansetron [facility]) injection 4 mg  4 mg Intravenous Q6H PRN Bensimhon, Bevelyn Buckles, MD   4 mg at 02/27/18 1610  . pregabalin (LYRICA) capsule 75 mg  75 mg Oral TID Bensimhon, Bevelyn Buckles, MD   75 mg at 03/02/18 1542  . sodium chloride flush (NS) 0.9 % injection 3 mL  3 mL Intravenous Q12H Bensimhon, Bevelyn Buckles, MD   3 mL at 03/02/18 0953  . sodium chloride flush (NS) 0.9 % injection 3 mL  3 mL Intravenous PRN Bensimhon, Bevelyn Buckles, MD        REVIEW OF SYSTEMS:  Reviewed and his history and physical with nothing to add  PHYSICAL EXAM: Vitals:   03/01/18 1532 03/01/18 2010  03/02/18 0433 03/02/18 1352  BP: 133/85 126/78 (!) 141/70 (!) 150/88  Pulse: 82 85 86 87  Resp: 15 16 18 13   Temp: 98.3 F (36.8 C) 98.1 F (36.7 C) 98 F (36.7 C) 98.4 F (36.9 C)  TempSrc: Axillary Oral Oral Oral  SpO2: 98% 100% 98% 91%  Weight:   (!) 152.1 kg   Height:        GENERAL: The patient is a well-nourished male, in no acute distress. The vital signs are documented above.  Morbidly obese CARDIOVASCULAR: 2+ radial pulses bilaterally.  Does have palpable cephalic vein at the wrist on the left. PULMONARY: There is good air exchange  MUSCULOSKELETAL: There are no major deformities or cyanosis. NEUROLOGIC: No focal weakness or paresthesias are detected. SKIN: There are no ulcers or rashes noted. PSYCHIATRIC: The patient has a normal affect.  DATA:  Vein map pending  MEDICAL ISSUES: Discussed options of hemodialysis catheter, AV fistula and AV graft with the patient.  He is very hesitant to consider any of this at this time.  I explained that this could be done as an outpatient will be more convenient as an inpatient.  I have asked to restrict his left arm and asked the nursing staff to remove his left forearm IV.  Will follow after vein map    Larina Earthly, MD [facility] Tel [phone] Pager [phone]

## 2018-03-02 NOTE — Progress Notes (Signed)
  Date: 03/02/2018  Patient name: [name]  Medical record number: [id number]  Date of birth: 06/07/1971   I have seen and evaluated this patient and I have discussed the plan of care with the house staff. Please see their note for complete details. I concur with their findings with the following additions/corrections: [name] was seen on team morning rounds.  He was sitting in a recliner and was much more alert and interactive today.  He did use CPAP some last night which is likely the reason for his improvement.  He feels better overall today with the exception of ongoing abdominal pain but is tolerating a diet.  He is net -2.1 L but his weights are not accurate.  His creatinine decreased from 4.09-3.56.  He has 1.9 g of albuminuria.  The team discussed [name] with Dr. Antoine Poche this morning.  He is preload dependent but is doing well on Lasix 120 mg every 6 (Dr. Hyman Hopes of nephrology is dosing Lasix). We will continue diuresis until his creatinine bumps or his blood pressure drops. Surgery appears to have indicated we will follow-up with him as an outpatient and his volume status is improved.    Burns Spain, MD 03/02/2018, 1:39 PM

## 2018-03-02 NOTE — Progress Notes (Addendum)
Subjective:  Kenneth Fox is a 47 y.o. with PMH of Morbid obesity, OSA, OHS, HTN, HLD, T2DM, CKD, HFpEF admit for cholecystitis on hospital day 4  Kenneth Fox was examined and evaluated at bedside chair this AM. Hereports that he feels tired but much improved from previously. He was able to tolerate breakfast this morning and denies fevers, chills, nausea or vomiting. His right cath procedure results were explained to him and he expressed understanding. He was able to use his CPAP yesterday and reports that he feels significantly more alert this morning. He has a CPAP at home but reports that it has to be fixed. He reports non-compliance due to the mask. He has tried various masks but he has trouble falling asleep due to discomfort.  We met cardiology in the hallway who mentions that his fluid status is better optimized. His medical conditions, including his pulmonary hypertension and morbid obesity, are present but is not prohibitive in undergoing his cholecystectomy.  Objective:  Vital signs in last 24 hours: Vitals:   03/01/18 1459 03/01/18 1532 03/01/18 2010 03/02/18 0433  BP: (!) 188/100 133/85 126/78 (!) 141/70  Pulse: 88 82 85 86  Resp: 12 15 16 18   Temp:  98.3 F (36.8 C) 98.1 F (36.7 C) 98 F (36.7 C)  TempSrc:  Axillary Oral Oral  SpO2: 100% 98% 100% 98%  Weight:    (!) 152.1 kg  Height:       Physical Exam  Constitutional: He is oriented to person, place, and time and well-developed, well-nourished, and in no distress. No distress.  HENT:  Head: Normocephalic and atraumatic.  Mouth/Throat: Oropharynx is clear and moist.  Eyes: Pupils are equal, round, and reactive to light. Conjunctivae and EOM are normal. No scleral icterus.  Neck: Normal range of motion. Neck supple.  Cardiovascular: Normal rate, regular rhythm, normal heart sounds and intact distal pulses.  Pulmonary/Chest: Effort normal and breath sounds normal. No respiratory distress. He has no rales.   Abdominal: Soft. Bowel sounds are normal. He exhibits distension. There is no tenderness. There is no guarding.  Musculoskeletal: Normal range of motion. He exhibits edema (1+ pitting edema up to mid shin). He exhibits no tenderness or deformity.  Neurological: He is alert and oriented to person, place, and time. GCS score is 15.  Skin: Skin is warm and dry. He is not diaphoretic.   Assessment/Plan:  Active Problems:   Acute kidney injury superimposed on CKD [facility])   Cholecystitis  Kenneth Fox is a 47 year old malew/PMH ofmorbid obesity, OSA, OHS, HTN, HLD, T2DM, CKD, HFpEFadmitted for cholecystitis.  Continuing aggressive diuresis per nephro recs.  Creatinine down trended to what may be his new baseline.  Right heart cath yesterday confirmed pulmonary hypertension and euvolemia.  Cardiology signed off stating his cardiac risk is as optimized as possible.  Recommend proceeding with cholecystectomy.  Abdominal Pain 2/2 biliary colic Will need cholecystectomy but waiting to optimize CKD and HFpEF first.Cardiology feels patient's cardiac risk is as optimized as possible. Alk phos downtrending. Currently no longer endorsing abdominal pain. AST 20 ALT 35 Alk phos 362 Bili 0.5 - Appreciate surgery recs - Zofran 58m IV q6 PRN for nausea  AKI onChronic Kidney Disease Stage 4 Creatinine 3.56<-4.09<-4.53 On aggressive diuresis per nephro. May be at new baseline. - Appreciate nephro recs - C/w Lasix 120 IV TID - Monitor bmp  Worsening Anemia 2/2 CKD vs Iron deficiency vs Bleed Hgb trending down 8.7<-9.2<-9.2<-9.5 Appeared to have been around 12 a  month ago. Most likely 2/2 renal failure and lack of EPO. MCV 80.6 close to microcytic. RDW 16.6 Will check iron panel - Iron sat, Ferritin, TIBC  Heart Failure with Preserved Ejection Fraction(55-60% 01/2018) BP this AM 141/70.RHC show severe pulmonary hypertension but PCWP of 16. Close to euvolemia. Delta -2000cc. Weight measured as increasing  from 152kg <- 151kg. - Appreciate cardiology recs - C/w Lasix 117m IV TId -Holding home bp meds -Daily weights - I&Os  Obstructive Sleep Apnea  Much more alert with CPAP use. - Strongly encourage CPAP use - C/w O2 Dolliver keep sat above 88  Type 2 Diabetes Mellitus Fasting BG146 -C/wLantus 25 units qhs - glucose checks - SSI  Asthma - Proventil q4hr PRN for wheezing  DVT prophx:Lovenox Diet: Cardiac/Diabetic w/ fluid resuscitation  Code: Full  Dispo: Anticipated discharge in approximately 4 day(s).   LMosetta Anis MD 03/02/2018, 11:23 AM Pager: [phone]

## 2018-03-02 NOTE — Plan of Care (Signed)
  Problem: Education: Goal: Knowledge of General Education information will improve Description Including pain rating scale, medication(s)/side effects and non-pharmacologic comfort measures Outcome: Progressing   Problem: Clinical Measurements: Goal: Ability to maintain clinical measurements within normal limits will improve Outcome: Progressing   Problem: Activity: Goal: Risk for activity intolerance will decrease Outcome: Progressing   Problem: Nutrition: Goal: Adequate nutrition will be maintained Outcome: Progressing   Problem: Elimination: Goal: Will not experience complications related to bowel motility Outcome: Progressing   Problem: Pain Managment: Goal: General experience of comfort will improve Outcome: Progressing   

## 2018-03-02 NOTE — Progress Notes (Addendum)
Kenneth Fox ROUNDING NOTE   Subjective:   Appears to be comfortable with some diuresis.  Almost 3 L overnight.  Weight not reflective of diuresis..  Underwent vein mapping.  We will consult VVS consideration of AV fistula placement.  Hemodynamically stable afebrile high-dose diuretics to promote diuresis massive volume overload.  Using CPAP and room no complaints  Objective:  Vital signs in last 24 hours:  Temp:  [98 F (36.7 C)-98.3 F (36.8 C)] 98 F (36.7 C) (09/25 0433) Pulse Rate:  [68-93] 86 (09/25 0433) Resp:  [12-21] 18 (09/25 0433) BP: (126-193)/(70-109) 141/70 (09/25 0433) SpO2:  [98 %-100 %] 98 % (09/25 0433) Weight:  [152.1 kg] 152.1 kg (09/25 0433)  Weight change: 1 kg Filed Weights   02/28/18 0408 03/01/18 0420 03/02/18 0433  Weight: (!) 146.4 kg (!) 151.1 kg (!) 152.1 kg    Intake/Output: I/O last 3 completed shifts: In: 690 [P.O.:640; IV Piggyback:50] Out: 3225 [Urine:3225]   Intake/Output this shift:  Total I/O In: 330 [P.O.:330] Out: 575 [Urine:575]   Periorbital edema and lip edema CVS- RRR RS- CTA diminished throughout ABD-morbidly obese nontender EXT-2+ edema   Basic Metabolic Panel: Recent Labs  Lab 02/27/18 0435 02/27/18 1344 02/28/18 0505 02/28/18 1522 03/01/18 0722 03/02/18 0515  NA 142 138 139  --  141 141  K 3.4* 3.9 3.4*  --  3.3* 3.7  CL 102 102 101  --  102 102  CO2 28 24 27   --  29 29  GLUCOSE 103* 200* 115*  --  129* 157*  BUN 35* 36* 35*  --  33* 31*  CREATININE 4.17* 4.59* 4.64* 4.53* 4.09* 3.56*  CALCIUM 8.3* 8.3* 8.1*  --  8.3* 8.1*    Liver Function Tests: Recent Labs  Lab 02/26/18 1617 02/27/18 0435 02/28/18 0505 03/01/18 0722 03/02/18 0515  AST 74* 41 36 40 20  ALT 79* 60* 61* 52* 35  ALKPHOS 314* 274* 375* 482* 362*  BILITOT 0.5 0.8 0.6 0.5 0.5  PROT 6.9 6.6 6.3* 6.4* 6.2*  ALBUMIN 2.8* 2.6* 2.6* 2.5* 2.5*   Recent Labs  Lab 02/25/18 2316  LIPASE 76*   No results for input(s):  AMMONIA in the last 168 hours.  CBC: Recent Labs  Lab 02/25/18 1027  02/26/18 1000 02/27/18 0435 02/28/18 0505 03/01/18 0722 03/02/18 0515  WBC 9.6   < > 12.5* 10.2 9.4 7.3 8.7  NEUTROABS 7.0  --   --   --   --   --   --   HGB 9.8*   < > 10.1* 9.5* 9.2* 9.2* 8.7*  HCT 32.6*   < > 33.1* 31.1* 30.4* 30.4* 28.2*  MCV 79.7   < > 79.4 80.8 81.1 80.6 80.6  PLT 367   < > 361 348 325 293 278   < > = values in this interval not displayed.    Cardiac Enzymes: No results for input(s): CKTOTAL, CKMB, CKMBINDEX, TROPONINI in the last 168 hours.  BNP: Invalid input(s): POCBNP  CBG: Recent Labs  Lab 03/01/18 1638 03/01/18 2009 03/02/18 0023 03/02/18 0425 03/02/18 0721  GLUCAP 156* 239* 175* 146* 159*    Microbiology: Results for orders placed or performed during the hospital encounter of 02/25/18  Surgical pcr screen     Status: None   Collection Time: 02/26/18 11:32 AM  Result Value Ref Range Status   MRSA, PCR NEGATIVE NEGATIVE Final   Staphylococcus aureus NEGATIVE NEGATIVE Final    Comment: (NOTE) The Xpert SA Assay (  FDA approved for NASAL specimens in patients 12 years of age and older), is one component of a comprehensive surveillance program. It is not intended to diagnose infection nor to guide or monitor treatment. Performed at [facility], [address] [place], [place] [address]     Coagulation Studies: No results for input(s): LABPROT, INR in the last 72 hours.  Urinalysis: Recent Labs    02/28/18 1337 03/01/18 1305  COLORURINE YELLOW YELLOW  LABSPEC 1.012 1.010  PHURINE 5.0 5.0  GLUCOSEU 50* 50*  HGBUR SMALL* SMALL*  BILIRUBINUR NEGATIVE NEGATIVE  KETONESUR NEGATIVE NEGATIVE  PROTEINUR >=300* 100*  NITRITE NEGATIVE NEGATIVE  LEUKOCYTESUR NEGATIVE NEGATIVE      Imaging: No results found.   Medications:   . sodium chloride    . furosemide 120 mg (03/02/18 0950)   . enoxaparin (LOVENOX) injection  40 mg Subcutaneous Q24H  .  hydrocerin   Topical BID  . insulin aspart  0-20 Units Subcutaneous Q4H  . insulin glargine  25 Units Subcutaneous QHS  . pregabalin  75 mg Oral TID  . sodium chloride flush  3 mL Intravenous Q12H   sodium chloride, acetaminophen, albuterol, HYDROmorphone (DILAUDID) injection, ondansetron **OR** ondansetron (ZOFRAN) IV, sodium chloride flush  Assessment/ Plan:   Chronic renal insufficiency stage IV secondary to diabetes diabetic nephropathy patient appears to have a serum creatinine that ranges anywhere from about 3.5-4.5 which is about baseline.  Vein mapping has been provided he will now consult with vein and vascular surgery.  He has acute cholecystitis that has resolved his antibiotics have been discontinued have discussed with the surgery team this morning and they will not proceed with cholecystectomy until patient is metabolically improved.  He is undergone evaluation by cardiology, his right-sided heart catheterization revealed increased pulmonary pressures and acute on chronic diastolic heart failure he needs weight loss and CPAP.  These appear to be reasonable recommendations.  Anasarca Lasix 120 mg every 6 hours.  Improved diuresis morbid obesity right-sided heart failure pulmonary hypertension acute on chronic diastolic heart failure  Biliary colic with acute cholecystitis appears to have resolved will need cholecystectomy at some point  Diastolic heart failure appreciate input from cardiology obstructive sleep apnea encouraging CPAP use  Diabetes mellitus per primary team  Hypertension avoid the use of ACE inhibitors and ARB's   LOS: 4 Kenneth Fox @TODAY @12 :31 PM

## 2018-03-02 NOTE — Progress Notes (Addendum)
Rollene Rotunda, MD  Laurann Montana, PA-C        Schedule follow up in about one month with APP.      Dr. Antoine Poche requested a one-month follow-up appt be arranged. This is scheduled Wednesday Mar 30, 2018 8:30 AM with Harriet Pho NP. Info placed on AVS. Craven Crean PA-C

## 2018-03-02 NOTE — Progress Notes (Signed)
Patient to self administer CPAP when ready for bed using FFM and auto titration mode (min 4, max 20) as per patient's home regimen.  Patient encouraged to contact RT with any issues.

## 2018-03-02 NOTE — Progress Notes (Signed)
Progress Note  Patient Name: Kenneth Fox Date of Encounter: 03/02/2018  Primary Cardiologist:   No primary care provider on file.   Subjective   Breathing OK and less "tight" than previous.   Inpatient Medications    Scheduled Meds: . enoxaparin (LOVENOX) injection  40 mg Subcutaneous Q24H  . hydrocerin   Topical BID  . insulin aspart  0-20 Units Subcutaneous Q4H  . insulin glargine  25 Units Subcutaneous QHS  . pregabalin  75 mg Oral TID  . sodium chloride flush  3 mL Intravenous Q12H   Continuous Infusions: . sodium chloride    . furosemide 120 mg (03/02/18 0950)   PRN Meds: sodium chloride, acetaminophen, albuterol, HYDROmorphone (DILAUDID) injection, ondansetron **OR** ondansetron (ZOFRAN) IV, sodium chloride flush   Vital Signs    Vitals:   03/01/18 1459 03/01/18 1532 03/01/18 2010 03/02/18 0433  BP: (!) 188/100 133/85 126/78 (!) 141/70  Pulse: 88 82 85 86  Resp: 12 15 16 18   Temp:  98.3 F (36.8 C) 98.1 F (36.7 C) 98 F (36.7 C)  TempSrc:  Axillary Oral Oral  SpO2: 100% 98% 100% 98%  Weight:    (!) 152.1 kg  Height:        Intake/Output Summary (Last 24 hours) at 03/02/2018 1044 Last data filed at 03/02/2018 1610 Gross per 24 hour  Intake 800 ml  Output 2550 ml  Net -1750 ml   Filed Weights   02/28/18 0408 03/01/18 0420 03/02/18 0433  Weight: (!) 146.4 kg (!) 151.1 kg (!) 152.1 kg    Telemetry    NA - Personally Reviewed  ECG    NA - Personally Reviewed  Physical Exam   GEN: No  acute distress.   Neck: No  JVD Cardiac: RRR, no murmurs, rubs, or gallops.  Respiratory: Clear  to auscultation bilaterally. GI: Soft, nontender, non-distended, normal bowel sounds  MS:  No edema; No deformity. Neuro:   Nonfocal  Psych: Oriented and appropriate    Labs    Chemistry Recent Labs  Lab 02/28/18 0505 02/28/18 1522 03/01/18 0722 03/02/18 0515  NA 139  --  141 141  K 3.4*  --  3.3* 3.7  CL 101  --  102 102  CO2 27  --  29 29    GLUCOSE 115*  --  129* 157*  BUN 35*  --  33* 31*  CREATININE 4.64* 4.53* 4.09* 3.56*  CALCIUM 8.1*  --  8.3* 8.1*  PROT 6.3*  --  6.4* 6.2*  ALBUMIN 2.6*  --  2.5* 2.5*  AST 36  --  40 20  ALT 61*  --  52* 35  ALKPHOS 375*  --  482* 362*  BILITOT 0.6  --  0.5 0.5  GFRNONAA 14* 14* 16* 19*  GFRAA 16* 16* 18* 22*  ANIONGAP 11  --  10 10     Hematology Recent Labs  Lab 02/28/18 0505 03/01/18 0722 03/02/18 0515  WBC 9.4 7.3 8.7  RBC 3.75* 3.77* 3.50*  HGB 9.2* 9.2* 8.7*  HCT 30.4* 30.4* 28.2*  MCV 81.1 80.6 80.6  MCH 24.5* 24.4* 24.9*  MCHC 30.3 30.3 30.9  RDW 16.7* 16.6* 16.6*  PLT 325 293 278    Cardiac EnzymesNo results for input(s): TROPONINI in the last 168 hours. No results for input(s): TROPIPOC in the last 168 hours.   BNP Recent Labs  Lab 02/27/18 1344  BNP 43.3     DDimer No results for input(s): DDIMER in the  last 168 hours.   Radiology    No results found.  Cardiac Studies   Echo 8/10    Study Conclusions  - Left ventricle: The cavity size was normal. Wall thickness was   increased in a pattern of mild LVH. Systolic function was normal.   The estimated ejection fraction was in the range of 55% to 60%.   Wall motion was normal; there were no regional wall motion   abnormalities.  Impressions:  - Technically difficult; definity used; normal LV systolic   function; mild LVH; mild RVE with RV dysfunction.  Patient Profile     47 y.o. male with a hx of HTN, DM, GERD, OSA, CKD IV w/ hx AKI, morbid obesity, D-CHF, nl MV 2012 w/ EF 49%, nl EF by echo 01/2018, diabetic nephropathy with proteinuria, who is being seen for the evaluation of NSTEMI at the request of Dr Rogelia Boga.  Assessment & Plan    ACUTE ON CHRONIC DIASTOLIC HF:  Right heart cath with elevated pulmonary pressures.  At this point I am not suggesting that he needs vasodilator therapy.  Rather he needs weight loss.  CPAP.  Salt and fluid restriction.  No further cardiac testing at  this point.  He is on much higher dose diuretic and tolerating this well.  I will defer to Dr. Hyman Hopes.   I am not sure that his intake and output numbers are accurate.    ACUTE ON CHRONIC AKI:  His creat is tolerating the diuresis.  Again, I will defer.  He will be volume dependent for CO with his pulmonary HTN and probable RV dysfunction.    PREOP:  Patient is higher risk but not prohibitive risk for surgery if needed.  However, currently no plan for surgery.   CHRONIC HYPOXIA:   As above.   [facility] will sign off.   Medication Recommendations:  Meds as above.   Other recommendations (labs, testing, etc):  None.  Nephrology to follow renal function Follow up as an outpatient:  We will arrange follow up in our office for his pulmonary HTN.   For questions or updates, please contact [facility] Please consult [email or web address] for contact info under Cardiology/STEMI.   Signed, Rollene Rotunda, MD  03/02/2018, 10:44 AM

## 2018-03-03 ENCOUNTER — Encounter (HOSPITAL_COMMUNITY): Payer: Medicare Other

## 2018-03-03 DIAGNOSIS — D631 Anemia in chronic kidney disease: Secondary | ICD-10-CM

## 2018-03-03 DIAGNOSIS — D509 Iron deficiency anemia, unspecified: Secondary | ICD-10-CM

## 2018-03-03 LAB — COMPREHENSIVE METABOLIC PANEL
ALBUMIN: 2.8 g/dL — AB (ref 3.5–5.0)
ALK PHOS: 312 U/L — AB (ref 38–126)
ALT: 29 U/L (ref 0–44)
AST: 17 U/L (ref 15–41)
Anion gap: 9 (ref 5–15)
BUN: 26 mg/dL — ABNORMAL HIGH (ref 6–20)
CO2: 29 mmol/L (ref 22–32)
Calcium: 8.7 mg/dL — ABNORMAL LOW (ref 8.9–10.3)
Chloride: 104 mmol/L (ref 98–111)
Creatinine, Ser: 2.89 mg/dL — ABNORMAL HIGH (ref 0.61–1.24)
GFR calc Af Amer: 28 mL/min — ABNORMAL LOW (ref >60)
GFR calc non Af Amer: 24 mL/min — ABNORMAL LOW (ref >60)
GLUCOSE: 180 mg/dL — AB (ref 70–99)
POTASSIUM: 3 mmol/L — AB (ref 3.5–5.1)
Sodium: 142 mmol/L (ref 135–145)
Total Bilirubin: 0.6 mg/dL (ref 0.3–1.2)
Total Protein: 6.7 g/dL (ref 6.5–8.1)

## 2018-03-03 LAB — GLUCOSE, CAPILLARY
GLUCOSE-CAPILLARY: 144 mg/dL — AB (ref 70–99)
Glucose-Capillary: 124 mg/dL — ABNORMAL HIGH (ref 70–99)
Glucose-Capillary: 161 mg/dL — ABNORMAL HIGH (ref 70–99)
Glucose-Capillary: 169 mg/dL — ABNORMAL HIGH (ref 70–99)
Glucose-Capillary: 177 mg/dL — ABNORMAL HIGH (ref 70–99)
Glucose-Capillary: 190 mg/dL — ABNORMAL HIGH (ref 70–99)

## 2018-03-03 LAB — CBC
HCT: 30.5 % — ABNORMAL LOW (ref 39.0–52.0)
HEMOGLOBIN: 9.4 g/dL — AB (ref 13.0–17.0)
MCH: 24.4 pg — ABNORMAL LOW (ref 26.0–34.0)
MCHC: 30.8 g/dL (ref 30.0–36.0)
MCV: 79 fL (ref 78.0–100.0)
Platelets: 322 10*3/uL (ref 150–400)
RBC: 3.86 MIL/uL — ABNORMAL LOW (ref 4.22–5.81)
RDW: 16.6 % — AB (ref 11.5–15.5)
WBC: 8.2 10*3/uL (ref 4.0–10.5)

## 2018-03-03 LAB — IRON AND TIBC
Iron: 21 ug/dL — ABNORMAL LOW (ref 45–182)
Saturation Ratios: 6 % — ABNORMAL LOW (ref 17.9–39.5)
TIBC: 335 ug/dL (ref 250–450)
UIBC: 314 ug/dL

## 2018-03-03 LAB — FERRITIN: Ferritin: 16 ng/mL — ABNORMAL LOW (ref 24–336)

## 2018-03-03 MED ORDER — FERROUS SULFATE 325 (65 FE) MG PO TABS
325.0000 mg | ORAL_TABLET | Freq: Three times a day (TID) | ORAL | Status: DC
  Administered 2018-03-03 – 2018-03-05 (×6): 325 mg via ORAL
  Filled 2018-03-03 (×6): qty 1

## 2018-03-03 MED ORDER — METOPROLOL SUCCINATE ER 25 MG PO TB24
25.0000 mg | ORAL_TABLET | Freq: Every day | ORAL | Status: DC
  Administered 2018-03-03 – 2018-03-05 (×3): 25 mg via ORAL
  Filled 2018-03-03 (×3): qty 1

## 2018-03-03 MED ORDER — POTASSIUM CHLORIDE CRYS ER 20 MEQ PO TBCR
60.0000 meq | EXTENDED_RELEASE_TABLET | Freq: Once | ORAL | Status: AC
  Administered 2018-03-03: 60 meq via ORAL
  Filled 2018-03-03: qty 3

## 2018-03-03 NOTE — Plan of Care (Signed)
  Problem: Activity: Goal: Risk for activity intolerance will decrease Outcome: Progressing   Problem: Elimination: Goal: Will not experience complications related to bowel motility Outcome: Progressing Goal: Will not experience complications related to urinary retention Outcome: Progressing   Problem: Pain Managment: Goal: General experience of comfort will improve Outcome: Progressing   

## 2018-03-03 NOTE — Progress Notes (Signed)
Subjective:  Kenneth Fox is a 47 y.o. with PMH of morbid obesity, OSA, pulmonary HTN, HFpEF, HTN, HLD, T2DM admit for  on hospital day 5  Kenneth Fox was examined at bedside and reports that he is feeling well. He was upset about having inconclusive decisions from multiple physicians and not knowing exactly when he will undergo surgery. His concerns were appropriately addressed and it was explained to him that the nephrologist was recommending vascular surgery to proceed with fistula if considering if he will require dialysis in the future. He refused morning labs due to being stuck multiple times and will be appreciative if another phlebotomist will draw his blood. Denies any F/N/V/D/C. Able to sleep well with CPAP.   Objective:  Vital signs in last 24 hours: Vitals:   03/02/18 2018 03/02/18 2213 03/03/18 0433 03/03/18 1501  BP: (!) 147/100  (!) 164/110 (!) 160/85  Pulse: 90   79  Resp: _0 Temp: 97.9 F (36.6 C)  97.7 F (36.5 C) 98 F (36.7 C)  TempSrc: Oral  Oral Oral  SpO2: (!) 85% 92% 100% 97%  Weight:   (!) 144.9 kg   Height:       Physical Exam  Constitutional: He is oriented to person, place, and time and well-developed, well-nourished, and in no distress. No distress.  Morbidly obese  HENT:  Head: Normocephalic and atraumatic.  Mouth/Throat: Oropharynx is clear and moist. No oropharyngeal exudate.  Eyes: Pupils are equal, round, and reactive to light. Conjunctivae and EOM are normal. No scleral icterus.  Neck: Normal range of motion. Neck supple. No JVD present.  Cardiovascular: Normal rate, regular rhythm, normal heart sounds and intact distal pulses.  Pulmonary/Chest: Effort normal and breath sounds normal. He has no wheezes. He has no rales.  Abdominal: Soft. Bowel sounds are normal. There is no tenderness. There is no guarding.  Musculoskeletal: Normal range of motion. He exhibits edema (1+ pitting edema around ankles). He exhibits no tenderness or  deformity.  Neurological: He is alert and oriented to person, place, and time. No cranial nerve deficit. GCS score is 15.  Skin: Skin is warm and dry. He is not diaphoretic.  Psychiatric: Mood, memory, affect and judgment normal.    Assessment/Plan:  Active Problems:   Acute kidney injury superimposed on CKD ([place])   Cholecystitis  Kenneth Fox is a 47 year old malew/PMH ofmorbid obesity, OSA, OHS, HTN, HLD, T2DM, CKD, HFpEFadmitted for cholecystitis.  Continuing aggressive diuresis per nephro recs. General surgery would like to not perform his cholecystectomy during this admission due to concerns for his kidney function worsening to ESRD. Nephro is recommending graft placement during this admission. Patient requesting time to evaluate his options. If he is not getting graft placement, he is good for discharge but if he states he will make a decision by tomorrow.  Abdominal Pain 6/[id number] colic Will need cholecystectomy but waiting to optimize CKD and HFpEF first.Cardiology feels patient's cardiac risk is as optimized as possible. Abdominal pain resolved.AST17ALT 29Alk phos 312Bili 0.6 - Appreciate surgery recs - Zofran 66m IV q6 PRN for nausea  AKI onChronic Kidney Disease Stage 4 Creatinine 2.89<-3.56<-4.09<-4.53 On aggressive diuresis per nephro. Creatinine at baseline now. K 3.0 - 1 time dose of K-dur 648m - Appreciate nephro recs - C/w Lasix 120 IV TID - Monitor bmp  Worsening Anemia 2/2 CKD & Iron deficiency  Hgb trending down 8.2<- 8.7<-9.2<-9.2<-9.5 Baseline appears to be 12. MCV 79 close to microcytic. RDW 16.6 Ferritin 16  Iron sat 6% TIBC 335 - Ferrous sulfate 373m TID  Heart Failure with Preserved Ejection Fraction(55-60% 01/2018) BP this AM 162/110.RHC show severe pulmonary hypertension but PCWP of 16. Close to euvolemia. Delta -500cc. Weight from 145kg <- 152kg. - Start metoprolol 255mdaily - C/w Lasix 12073mV TId -Daily weights -  I&Os  Obstructive Sleep Apnea  Much more alert with CPAP use. - Strongly encourage CPAP use - C/w O2 [name] keep sat above 88  Type 2 Diabetes Mellitus Fasting BG146 -C/wLantus 25 units qhs - glucose checks - SSI  Asthma - Proventil q4hr PRN for wheezing  Dispo: Anticipated discharge in approximately 2 day(s).   DVT prophx: Lovenox Diet: Renal/Diabetic w/ fluid restrcition Code: FULLindwood CokeD 03/03/2018, 4:11 PM Pager: [phone]

## 2018-03-03 NOTE — Care Management Important Message (Signed)
Important Message  Patient Details  Name: Kenneth Fox MRN: [id number] Date of Birth: 07-Apr-1971   Medicare Important Message Given:  Yes    Dorena Bodo 03/03/2018, 2:38 PM

## 2018-03-03 NOTE — Telephone Encounter (Signed)
Opened in error

## 2018-03-03 NOTE — Progress Notes (Signed)
[facility] R[facility]ING NOTE   Subjective:   Appears to be comfortable with some diuresis.  Almost 3 L overnight.  Weight not reflective of diuresis..  Underwent vein mapping.  Patient does not wish to undergo AV fistula placement he wishes to think about this over the next 24 hours of advised him that receiving a dialysis access at this time would be preferential  Blood pressure still slightly elevated 160/110.  Weight 144 kg.  O2 sats 100% on CPAP.  Hemoglobin 9.4  Objective:  Vital signs in last 24 hours:  Temp:  [97.7 F (36.5 C)-98.4 F (36.9 C)] 97.7 F (36.5 C) (09/26 0433) Pulse Rate:  [87-90] 90 (09/25 2018) Resp:  [13-20] 20 (09/26 0433) BP: (147-164)/(88-110) 164/110 (09/26 0433) SpO2:  [85 %-100 %] 100 % (09/26 0433) Weight:  [144.9 kg] 144.9 kg (09/26 0433)  Weight change: -7.176 kg Filed Weights   03/01/18 0420 03/02/18 0433 03/03/18 0433  Weight: (!) 151.1 kg (!) 152.1 kg (!) 144.9 kg    Intake/Output: I/O last 3 completed shifts: In: 1399.3 [P.O.:1330; IV Piggyback:69.3] Out: 2500 [Urine:2500]   Intake/Output this shift:  No intake/output data recorded.   Periorbital edema and lip edema CVS- RRR RS- CTA diminished throughout ABD-morbidly obese nontender EXT-2+ edema   Basic Metabolic Panel: Recent Labs  Lab 02/27/18 0435 02/27/18 1344 02/28/18 0505 02/28/18 1522 03/01/18 0722 03/02/18 0515  NA 142 138 139  --  141 141  K 3.4* 3.9 3.4*  --  3.3* 3.7  CL 102 102 101  --  102 102  CO2 28 24 27   --  29 29  GLUCOSE 103* 200* 115*  --  129* 157*  BUN 35* 36* 35*  --  33* 31*  CREATININE 4.17* 4.59* 4.64* 4.53* 4.09* 3.56*  CALCIUM 8.3* 8.3* 8.1*  --  8.3* 8.1*    Liver Function Tests: Recent Labs  Lab 02/26/18 1617 02/27/18 0435 02/28/18 0505 03/01/18 0722 03/02/18 0515  AST 74* 41 36 40 20  ALT 79* 60* 61* 52* 35  ALKPHOS 314* 274* 375* 482* 362*  BILITOT 0.5 0.8 0.6 0.5 0.5  PROT 6.9 6.6 6.3* 6.4* 6.2*  ALBUMIN 2.8*  2.6* 2.6* 2.5* 2.5*   Recent Labs  Lab 02/25/18 2316  LIPASE 76*   No results for input(s): AMMONIA in the last 168 hours.  CBC: Recent Labs  Lab 02/25/18 1027  02/27/18 0435 02/28/18 0505 03/01/18 0722 03/02/18 0515 03/03/18 1100  WBC 9.6   < > 10.2 9.4 7.3 8.7 8.2  NEUTROABS 7.0  --   --   --   --   --   --   HGB 9.8*   < > 9.5* 9.2* 9.2* 8.7* 9.4*  HCT 32.6*   < > 31.1* 30.4* 30.4* 28.2* 30.5*  MCV 79.7   < > 80.8 81.1 80.6 80.6 79.0  PLT 367   < > 348 325 293 278 322   < > = values in this interval not displayed.    Cardiac Enzymes: No results for input(s): CKTOTAL, CKMB, CKMBINDEX, TROPONINI in the last 168 hours.  BNP: Invalid input(s): POCBNP  CBG: Recent Labs  Lab 03/02/18 1804 03/02/18 2014 03/03/18 0048 03/03/18 0432 03/03/18 0816  GLUCAP 161* 167* 161* 124* 144*    Microbiology: Results for orders placed or performed during the hospital encounter of 02/25/18  Surgical pcr screen     Status: None   Collection Time: 02/26/18 11:32 AM  Result Value Ref Range Status  MRSA, PCR NEGATIVE NEGATIVE Final   Staphylococcus aureus NEGATIVE NEGATIVE Final    Comment: (NOTE) The Xpert SA Assay (FDA approved for NASAL specimens in patients 67 years of age and older), is one component of a comprehensive surveillance program. It is not intended to diagnose infection nor to guide or monitor treatment. Performed at [facility], [address], [place], [place] [address]     Coagulation Studies: No results for input(s): LABPROT, INR in the last 72 hours.  Urinalysis: Recent Labs    02/28/18 1337 03/01/18 1305  COLORURINE YELLOW YELLOW  LABSPEC 1.012 1.010  PHURINE 5.0 5.0  GLUCOSEU 50* 50*  HGBUR SMALL* SMALL*  BILIRUBINUR NEGATIVE NEGATIVE  KETONESUR NEGATIVE NEGATIVE  PROTEINUR >=300* 100*  NITRITE NEGATIVE NEGATIVE  LEUKOCYTESUR NEGATIVE NEGATIVE      Imaging: No results found.   Medications:   . sodium chloride    .  furosemide 120 mg (03/03/18 1026)   . enoxaparin (LOVENOX) injection  40 mg Subcutaneous Q24H  . hydrocerin   Topical BID  . insulin aspart  0-20 Units Subcutaneous TID AC & HS  . insulin glargine  25 Units Subcutaneous QHS  . metoprolol succinate  25 mg Oral Daily  . pregabalin  75 mg Oral TID  . sodium chloride flush  3 mL Intravenous Q12H   sodium chloride, acetaminophen, albuterol, HYDROmorphone (DILAUDID) injection, ondansetron **OR** ondansetron (ZOFRAN) IV, sodium chloride flush  Assessment/ Plan:   Chronic renal insufficiency stage IV secondary to diabetes diabetic nephropathy patient appears to have a serum creatinine that ranges anywhere from about 3.5-4.5 which is about baseline.  Vein mapping has been done.  He has seen vascular surgeon but is refusing undergoing AV fistula placement at this time.  I appreciate Dr. Bosie Helper help  Anasarca Lasix 120 mg every 6 hours.  Improved diuresis morbid obesity right-sided heart failure pulmonary hypertension acute on chronic diastolic heart failure  Biliary colic with acute cholecystitis appears to have resolved will need cholecystectomy at some point  Diastolic heart failure appreciate input from cardiology obstructive sleep apnea encouraging CPAP use  Diabetes mellitus per primary team  Hypertension avoid the use of ACE inhibitors and ARB's   LOS: 5 [name] @TODAY @12 :07 PM

## 2018-03-03 NOTE — Progress Notes (Signed)
  Date: 03/03/2018  Patient name: Kenneth Fox  Medical record number: [id number]  Date of birth: 11-Mar-1971   I have seen and evaluated this patient and I have discussed the plan of care with the house staff. Please see their note for complete details. I concur with their findings with the following additions/corrections: Kenneth Fox was seen on team morning rounds.  He was sitting in his recliner and again remains alert and interactive.  He used CPAP last night and states he can feel more energetic today after he uses CPAP.  He had his wife on his cell phone and Dr. Nedra Hai was able to update his wife.  Cardiology has signed off after completing right heart cath and getting him as stable as he can be preoperatively.  Dr. Nedra Hai was going to discuss with surgery whether his cholecystectomy could be done as an inpatient.  Vascular surgery consulted on the patient for vein mapping but the patient has elected to think about before undergoing it which now will likely be an outpatient procedure.  In regards to his volume status, his I/O are recorded as net -950 cc but this only considers 400 cc of oral intake and I know that this is a gross underestimation as he always has multiple bottles and cups of liquids in his room.  His weight is also inaccurate.  He received Lasix 120 IV 3 times daily yesterday and his creatinine continues to trend down from 3.56-2.89.  He is hypokalemic today but his bicarb and BUN are either stable or improving.  He does have 1.9 g of albuminuria but treating this is minimally problematic.  Nephrology has not wanted him on an ACE inhibitor due to his renal function.  I would be hesitant to start a non-dihydropyridine calcium channel blocker due to his volume status.  His GFR is currently too low for an SGLT2 inhibitor which can also improve proteinuria.  We have no great options other than trying to optimize blood pressure and diabetes control, using CPAP, weight loss, obtaining euvolemia,  and improving renal function.  We will continue Lasix 120 IV every 6 hours as his renal function is improving with diuresis.  Burns Spain, MD 03/03/2018, 3:59 PM

## 2018-03-04 ENCOUNTER — Ambulatory Visit (HOSPITAL_COMMUNITY): Admission: RE | Admit: 2018-03-04 | Payer: Medicare Other | Source: Ambulatory Visit | Admitting: General Surgery

## 2018-03-04 ENCOUNTER — Encounter (HOSPITAL_COMMUNITY): Admission: RE | Payer: Self-pay | Source: Ambulatory Visit

## 2018-03-04 LAB — RENAL FUNCTION PANEL
Albumin: 2.8 g/dL — ABNORMAL LOW (ref 3.5–5.0)
Anion gap: 7 (ref 5–15)
BUN: 22 mg/dL — ABNORMAL HIGH (ref 6–20)
CO2: 31 mmol/L (ref 22–32)
CREATININE: 2.64 mg/dL — AB (ref 0.61–1.24)
Calcium: 9.2 mg/dL (ref 8.9–10.3)
Chloride: 104 mmol/L (ref 98–111)
GFR calc non Af Amer: 27 mL/min — ABNORMAL LOW (ref >60)
GFR, EST AFRICAN AMERICAN: 31 mL/min — AB (ref >60)
Glucose, Bld: 139 mg/dL — ABNORMAL HIGH (ref 70–99)
Phosphorus: 2.9 mg/dL (ref 2.5–4.6)
Potassium: 3.3 mmol/L — ABNORMAL LOW (ref 3.5–5.1)
SODIUM: 142 mmol/L (ref 135–145)

## 2018-03-04 LAB — GLUCOSE, CAPILLARY
GLUCOSE-CAPILLARY: 118 mg/dL — AB (ref 70–99)
GLUCOSE-CAPILLARY: 174 mg/dL — AB (ref 70–99)
Glucose-Capillary: 126 mg/dL — ABNORMAL HIGH (ref 70–99)
Glucose-Capillary: 173 mg/dL — ABNORMAL HIGH (ref 70–99)

## 2018-03-04 SURGERY — LAPAROSCOPIC CHOLECYSTECTOMY WITH INTRAOPERATIVE CHOLANGIOGRAM
Anesthesia: General

## 2018-03-04 MED ORDER — POTASSIUM CHLORIDE 10 MEQ/100ML IV SOLN: 10.0000 meq | INTRAVENOUS | Status: DC

## 2018-03-04 MED ORDER — POTASSIUM CHLORIDE CRYS ER 20 MEQ PO TBCR
40.0000 meq | EXTENDED_RELEASE_TABLET | Freq: Two times a day (BID) | ORAL | Status: DC
  Administered 2018-03-04 – 2018-03-05 (×3): 40 meq via ORAL
  Filled 2018-03-04 (×3): qty 2

## 2018-03-04 NOTE — Progress Notes (Signed)
Subjective:  Kenneth Fox is a 47 y.o. with PMH of morbid obesity, OSA, pulmonary HTN, HFpEF, HTN, HLD, T2DM admit for  on hospital day 6  Kenneth Fox was examined at bedside chair this AM. Observed sleeping in chair. Has no complaints at this time. States would like to go home. He states he made his decision about the fistula and states he would like   Objective:  Vital signs in last 24 hours: Vitals:   03/03/18 1501 03/03/18 2021 03/04/18 0534 03/04/18 0755  BP: (!) 160/85 (!) 148/80 126/74   Pulse: 79 74 72   Resp: 16 18 18    Temp: 98 F (36.7 C) 98.5 F (36.9 C) 98.1 F (36.7 C)   TempSrc: Oral Oral Oral   SpO2: 97% 98% 94%   Weight:    (!) 143.7 kg  Height:       Physical Exam  Constitutional: He is oriented to person, place, and time and well-developed, well-nourished, and in no distress. No distress.  Morbidly obese  HENT:  Head: Normocephalic and atraumatic.  Mouth/Throat: Oropharynx is clear and moist. No oropharyngeal exudate.  Eyes: Pupils are equal, round, and reactive to light. Conjunctivae and EOM are normal. No scleral icterus.  Neck: Normal range of motion. Neck supple. No JVD present.  Cardiovascular: Normal rate, regular rhythm, normal heart sounds and intact distal pulses.  Pulmonary/Chest: Effort normal and breath sounds normal. He has no wheezes. He has no rales.  Abdominal: Soft. Bowel sounds are normal. There is no tenderness. There is no guarding.  Musculoskeletal: Normal range of motion. He exhibits edema (trace pitting edema around ankles). He exhibits no tenderness or deformity.  Neurological: He is alert and oriented to person, place, and time. No cranial nerve deficit. GCS score is 15.  Skin: Skin is warm and dry. He is not diaphoretic.  Psychiatric: Mood, memory, affect and judgment normal.   Assessment/Plan:  Active Problems:   Acute kidney injury superimposed on CKD ([facility])   Cholecystitis  Kenneth Fox is a 47 year old malew/PMH  ofmorbid obesity, OSA, OHS, HTN, HLD, T2DM, CKD, HFpEFadmitted for cholecystitis.  Continuing aggressive diuresis per nephro recs. He states he would like to leave the hospital tomorrow. Will try to diuresis off as much as possible before he leaves. Will have to clarify maintenance diuresis dose at home to prevent another episode of hypervolemia at home.  AKI onChronic Kidney Disease Stage 4 Creatinine 2.64<- 2.89<-3.56<-4.09<-4.53 On aggressive diuresis per nephro. Creatinine at baseline now. K 3.3 this AM - IV KCl 78mEqx4 per nephro - Appreciate nephro recs - C/w Lasix 120 IV QID - F/u outpatient for graft placement - Monitor bmp  Abdominal Pain 2/2biliary colic Surgery would like him to have dialysis access before proceeding with elective cholecystectomy.  - F/u as outpatient  Worsening Anemia 2/2 CKD & Iron deficiency  Hgb trending down 8.2<- 8.7<-9.2<-9.2<-9.5 Baseline appears to be 12. MCV 79 close to microcytic. RDW 16.6 Ferritin 16 Iron sat 6% TIBC 335 - c/w Ferrous sulfate 325mg  TID  Heart Failure with Preserved Ejection Fraction(55-60% 01/2018) BP this AM 126/74.RHC show severe pulmonary hypertension but PCWP of 16. Close to euvolemia. Delta -3000cc but suspect intake not recorded accurately. Weight from 143.7kg <- 145kg. - C/w metoprolol 25mg  daily - C/w Lasix 120mg  IV qId -Daily weights - I&Os  Obstructive Sleep Apnea  Much more alert with CPAP use. - Strongly encourage CPAP use - C/w O2 [place] keep sat above 88  Type 2 Diabetes Mellitus Fasting  BG146 -C/wLantus 25 units qhs - glucose checks - SSI  Asthma - Proventil q4hr PRN for wheezing  Dispo: Anticipated discharge in approximately 1 day(s).   DVT prophx: Lovenox Diet: Renal/Diabetic w/ fluid restrcition Code: Marshia Ly, MD 03/04/2018, 11:50 AM Pager: [phone]

## 2018-03-04 NOTE — Progress Notes (Addendum)
Patient states he is not sure if he has urinated today. Confirmed with NT she has not emptied any urine. I told patient we will need to bladder scan him. He refused and stated he knows he used the bathroom today and he thinks he urinated.   1610- Paged MD to make aware.

## 2018-03-04 NOTE — Progress Notes (Signed)
RT set up patient CPAP HS. 3L O2 bleed in needed. Patient is able to place his self on when he is ready.

## 2018-03-04 NOTE — Progress Notes (Signed)
  Date: 03/04/2018  Patient name: Kenneth Fox  Medical record number: [id number]  Date of birth: 08/30/1970   I have seen and evaluated this patient and I have discussed the plan of care with the house staff. Please see their note for complete details. I concur with their findings with the following additions/corrections: Dr. Nedra Hai discussed Kenneth Fox with surgery and has indicated he has not going to get an inpatient cholecystectomy.  Any intervention will be in the outpatient setting after his renal situation is more stable.  Nephrology is dosing his diuretics and he remains on Lasix 120 every 6 hours.  Despite aggressive diuresis, his creatinine continues to trend down.  His CO2 is stable and normal.  His BUN is slightly elevated but is actually lower than it was earlier this week.  We will continue his diuresis until his creatinine bumps but the patient is getting antsy about remaining in the hospital and may not stay beyond tomorrow.  Burns Spain, MD 03/04/2018, 2:36 PM

## 2018-03-04 NOTE — Plan of Care (Signed)
  Problem: Clinical Measurements: Goal: Diagnostic test results will improve Outcome: Progressing Goal: Respiratory complications will improve Outcome: Progressing Goal: Cardiovascular complication will be avoided Outcome: Progressing   Problem: Activity: Goal: Risk for activity intolerance will decrease Outcome: Progressing   Problem: Pain Managment: Goal: General experience of comfort will improve Outcome: Progressing   

## 2018-03-04 NOTE — Progress Notes (Signed)
[facility]  Subjective  -47 year old male with chronic renal insufficiency, morbid obesity, congestive heart failure previously seen in consultation for new AV fistula access.   Objective 126/74 72 98.1 F (36.7 C) (Oral) 18 94%  Intake/Output Summary (Last 24 hours) at 03/04/2018 0734 Last data filed at 03/04/2018 0327 Gross per 24 hour  Intake 400 ml  Output 3400 ml  Net -3000 ml    PE General: resting in bed, Bipap, obese  Laboratory Lab Results: Recent Labs    03/02/18 0515 03/03/18 1100  WBC 8.7 8.2  HGB 8.7* 9.4*  HCT 28.2* 30.5*  PLT 278 322   BMET Recent Labs    03/02/18 0515 03/03/18 1100  NA 141 142  K 3.7 3.0*  CL 102 104  CO2 29 29  GLUCOSE 157* 180*  BUN 31* 26*  CREATININE 3.56* 2.89*  CALCIUM 8.1* 8.7*    COAG Lab Results  Component Value Date   INR 0.96 01/20/2018   INR 1.07 06/01/2015   INR 1.00 09/06/2013   No results found for: PTT  Assessment/Planning: Discussed with patient in detail this morning plan for vein mapping to evaluate options for AV access.  Patient states he is amendable to having his vein mapping done here in the hospital.  However, he wants to continue thinking about his options and would like to follow-up as an outpatient to discuss surgery.  Cephus Shelling 03/04/2018 7:34 AM --  Cephus Shelling

## 2018-03-04 NOTE — Progress Notes (Signed)
[facility] ROUNDING NOTE   Subjective:   Appears to be comfortable with some diuresis.  Almost 2-1/2 L overnight.  Patient does not wish to undergo AV fistula.  He is discussed with Dr. Chestine Spore this morning for vascular surgery and is agreeable to vein mapping at this point however wants to think about his options  Blood pressure 126/74 afebrile O2 sats 94% nasal cannula sodium 142 potassium 3.0 creatinine 2.89 calcium 8.7 albumin 2.8  Objective:  Vital signs in last 24 hours:  Temp:  [98 F (36.7 C)-98.5 F (36.9 C)] 98.1 F (36.7 C) (09/27 0534) Pulse Rate:  [72-79] 72 (09/27 0534) Resp:  [16-18] 18 (09/27 0534) BP: (126-160)/(74-85) 126/74 (09/27 0534) SpO2:  [94 %-98 %] 94 % (09/27 0534) Weight:  [143.7 kg] 143.7 kg (09/27 0755)  Weight change:  Filed Weights   03/02/18 0433 03/03/18 0433 03/04/18 0755  Weight: (!) 152.1 kg (!) 144.9 kg (!) 143.7 kg    Intake/Output: I/O last 3 completed shifts: In: 880 [P.O.:880] Out: 4100 [Urine:4100]   Intake/Output this shift:  Total I/O In: 238 [P.O.:238] Out: -     CVS- RRR RS- CTA diminished throughout ABD-morbidly obese nontender EXT-1+ lower extremity edema   Basic Metabolic Panel: Recent Labs  Lab 02/27/18 1344 02/28/18 0505 02/28/18 1522 03/01/18 0722 03/02/18 0515 03/03/18 1100  NA 138 139  --  141 141 142  K 3.9 3.4*  --  3.3* 3.7 3.0*  CL 102 101  --  102 102 104  CO2 24 27  --  29 29 29   GLUCOSE 200* 115*  --  129* 157* 180*  BUN 36* 35*  --  33* 31* 26*  CREATININE 4.59* 4.64* 4.53* 4.09* 3.56* 2.89*  CALCIUM 8.3* 8.1*  --  8.3* 8.1* 8.7*    Liver Function Tests: Recent Labs  Lab 02/27/18 0435 02/28/18 0505 03/01/18 0722 03/02/18 0515 03/03/18 1100  AST 41 36 40 20 17  ALT 60* 61* 52* 35 29  ALKPHOS 274* 375* 482* 362* 312*  BILITOT 0.8 0.6 0.5 0.5 0.6  PROT 6.6 6.3* 6.4* 6.2* 6.7  ALBUMIN 2.6* 2.6* 2.5* 2.5* 2.8*   Recent Labs  Lab 02/25/18 2316  LIPASE 76*   No  results for input(s): AMMONIA in the last 168 hours.  CBC: Recent Labs  Lab 02/27/18 0435 02/28/18 0505 03/01/18 0722 03/02/18 0515 03/03/18 1100  WBC 10.2 9.4 7.3 8.7 8.2  HGB 9.5* 9.2* 9.2* 8.7* 9.4*  HCT 31.1* 30.4* 30.4* 28.2* 30.5*  MCV 80.8 81.1 80.6 80.6 79.0  PLT 348 325 293 278 322    Cardiac Enzymes: No results for input(s): CKTOTAL, CKMB, CKMBINDEX, TROPONINI in the last 168 hours.  BNP: Invalid input(s): POCBNP  CBG: Recent Labs  Lab 03/03/18 0816 03/03/18 1230 03/03/18 1628 03/03/18 2018 03/04/18 0741  GLUCAP 144* 169* 190* 177* 118*    Microbiology: Results for orders placed or performed during the hospital encounter of 02/25/18  Surgical pcr screen     Status: None   Collection Time: 02/26/18 11:32 AM  Result Value Ref Range Status   MRSA, PCR NEGATIVE NEGATIVE Final   Staphylococcus aureus NEGATIVE NEGATIVE Final    Comment: (NOTE) The Xpert SA Assay (FDA approved for NASAL specimens in patients 15 years of age and older), is one component of a comprehensive surveillance program. It is not intended to diagnose infection nor to guide or monitor treatment. Performed at [facility], [address], [place], [place] [address]  Coagulation Studies: No results for input(s): LABPROT, INR in the last 72 hours.  Urinalysis: Recent Labs    03/01/18 1305  COLORURINE YELLOW  LABSPEC 1.010  PHURINE 5.0  GLUCOSEU 50*  HGBUR SMALL*  BILIRUBINUR NEGATIVE  KETONESUR NEGATIVE  PROTEINUR 100*  NITRITE NEGATIVE  LEUKOCYTESUR NEGATIVE      Imaging: No results found.   Medications:   . sodium chloride    . furosemide 120 mg (03/04/18 1007)   . enoxaparin (LOVENOX) injection  40 mg Subcutaneous Q24H  . ferrous sulfate  325 mg Oral TID WC  . hydrocerin   Topical BID  . insulin aspart  0-20 Units Subcutaneous TID AC & HS  . insulin glargine  25 Units Subcutaneous QHS  . metoprolol succinate  25 mg Oral Daily  . pregabalin  75 mg  Oral TID  . 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  His creatinine appears little lower today.  Patient wished to hold off on fistula placement at this time.  His morbid obesity I do not think he is a candidate for peritoneal dialysis.  He follows with [name] at [facility] and can continue to follow with her  Anasarca Lasix 120 mg every 6 hours.  Improved diuresis morbid obesity right-sided heart failure pulmonary hypertension acute on chronic diastolic heart failure continues to diuresis well  Biliary colic with acute cholecystitis appears to have resolved will need cholecystectomy at some point  Diastolic heart failure appreciate input from cardiology obstructive sleep apnea encouraging CPAP use  Diabetes mellitus per primary team  Hypertension avoid the use of ACE inhibitors and ARB's  Hypokalemia replete   LOS: 6 [name] @TODAY @10 :42 AM

## 2018-03-05 ENCOUNTER — Inpatient Hospital Stay (HOSPITAL_COMMUNITY): Payer: Medicare Other

## 2018-03-05 ENCOUNTER — Other Ambulatory Visit: Payer: Self-pay | Admitting: Internal Medicine

## 2018-03-05 DIAGNOSIS — G4733 Obstructive sleep apnea (adult) (pediatric): Secondary | ICD-10-CM

## 2018-03-05 DIAGNOSIS — I2722 Pulmonary hypertension due to left heart disease: Secondary | ICD-10-CM

## 2018-03-05 DIAGNOSIS — K8040 Calculus of bile duct with cholecystitis, unspecified, without obstruction: Secondary | ICD-10-CM

## 2018-03-05 DIAGNOSIS — Z01810 Encounter for preprocedural cardiovascular examination: Secondary | ICD-10-CM

## 2018-03-05 DIAGNOSIS — Z6841 Body Mass Index (BMI) 40.0 and over, adult: Secondary | ICD-10-CM

## 2018-03-05 DIAGNOSIS — Z888 Allergy status to other drugs, medicaments and biological substances status: Secondary | ICD-10-CM

## 2018-03-05 LAB — GLUCOSE, CAPILLARY
Glucose-Capillary: 136 mg/dL — ABNORMAL HIGH (ref 70–99)
Glucose-Capillary: 127 mg/dL — ABNORMAL HIGH (ref 70–99)

## 2018-03-05 LAB — RENAL FUNCTION PANEL
Albumin: 2.7 g/dL — ABNORMAL LOW (ref 3.5–5.0)
Anion gap: 9 (ref 5–15)
BUN: 23 mg/dL — AB (ref 6–20)
CHLORIDE: 104 mmol/L (ref 98–111)
CO2: 26 mmol/L (ref 22–32)
Calcium: 8.7 mg/dL — ABNORMAL LOW (ref 8.9–10.3)
Creatinine, Ser: 3.37 mg/dL — ABNORMAL HIGH (ref 0.61–1.24)
GFR calc Af Amer: 23 mL/min — ABNORMAL LOW (ref >60)
GFR calc non Af Amer: 20 mL/min — ABNORMAL LOW (ref >60)
Glucose, Bld: 168 mg/dL — ABNORMAL HIGH (ref 70–99)
POTASSIUM: 3.5 mmol/L (ref 3.5–5.1)
Phosphorus: 3.3 mg/dL (ref 2.5–4.6)
Sodium: 139 mmol/L (ref 135–145)

## 2018-03-05 MED ORDER — FUROSEMIDE 80 MG PO TABS: 160.0000 mg | ORAL_TABLET | Freq: Three times a day (TID) | ORAL | Status: DC

## 2018-03-05 MED ORDER — METOPROLOL SUCCINATE ER 25 MG PO TB24: 25.0000 mg | ORAL_TABLET | Freq: Every day | ORAL | 0 refills | Status: AC

## 2018-03-05 MED ORDER — FUROSEMIDE 80 MG PO TABS: 160.0000 mg | ORAL_TABLET | Freq: Three times a day (TID) | ORAL | 0 refills | Status: AC

## 2018-03-05 NOTE — Progress Notes (Signed)
*  Preliminary Results*  Bilateral upper extremity vein mapping completed.     03/05/2018 12:33 PM Gertie Fey, MHA, RVT, RDCS, RDMS

## 2018-03-05 NOTE — Progress Notes (Signed)
Subjective:  Kenneth Fox is a 47 y.o. with PMH of morbid obesity, OSA, pulmonary HTN, HFpEF, HTN, HLD, T2DM admit for  on hospital day 7  Kenneth Fox was examined at bedside chair this AM. He states he is feeling okay today but is ready to go home. We were notified yesterday afternoon that he had decreased UOP and was declining a bladder scan. He states hehad been using the commode so it was not recorded, but he had been urinating well. He was informed that we need to get his morning labs before making a final determination, but we are hopeful he can go home today. We informed him we would also like to get his vein mapping done before he leaves. He has no further questions or complaints this morning.  Objective:  Vital signs in last 24 hours: Vitals:   03/04/18 2105 03/04/18 2122 03/05/18 0606 03/05/18 0700  BP: (!) 158/104 (!) 149/90 139/88   Pulse: 79 72 65   Resp: 18  20   Temp: 98.6 F (37 C)     TempSrc: Oral     SpO2: 100%  97%   Weight:    (!) 143.4 kg  Height:       Physical Exam  Constitutional: He is oriented to person, place, and time and well-developed, well-nourished, and in no distress. No distress.  Morbidly obese  HENT:  Head: Normocephalic and atraumatic.  Eyes: Pupils are equal, round, and reactive to light. Conjunctivae and EOM are normal.  Neck: Normal range of motion. Neck supple.  Cardiovascular: Normal rate, regular rhythm, normal heart sounds and intact distal pulses.  Pulmonary/Chest: Effort normal and breath sounds normal. He has no wheezes. He has no rales.  Abdominal: Soft. Bowel sounds are normal. There is no tenderness. There is no guarding.  Musculoskeletal: Normal range of motion. He exhibits edema (trace pitting edema around ankles). He exhibits no tenderness or deformity.  Neurological: He is alert and oriented to person, place, and time.  Skin: Skin is warm and dry. He is not diaphoretic.  Psychiatric: Mood, memory, affect and judgment  normal.   Assessment/Plan:  Active Problems:   Acute kidney injury superimposed on CKD (HCC)   Cholecystitis  Kenneth Fox is a 47 year old malew/PMH ofmorbid obesity, OSA, OHS, HTN, HLD, T2DM, CKD, HFpEFadmitted for cholecystitis.  Continuing aggressive diuresis per nephro recs. He states he would like to leave the hospital today. Will try to diuresis off as much as possible before he leaves.  AKI onChronic Kidney Disease Stage 4 Creatinine 2.64<- 2.89<-3.56<-4.09<-4.53 On aggressive diuresis per nephro. Creatinine at baseline now. > Awaiting AM RFP  - Will replete E-lytes as needed - Anticipate discharge on 160mg  PO Lasix TID, based on nephro recs - Appreciate nephro recs - C/w Lasix 120 IV QID - F/u outpatient for graft placement  Abdominal Pain 2/2biliary colic Surgery would like him to have dialysis access before proceeding with elective cholecystectomy.  - F/u as outpatient  Worsening Anemia 2/2 CKD & Iron deficiency  Hgb trending down 8.2<- 8.7<-9.2<-9.2<-9.5 Baseline appears to be 12. MCV 79 close to microcytic. RDW 16.6 Ferritin 16 Iron sat 6% TIBC 335 - c/w Ferrous sulfate 325mg  TID  Heart Failure with Preserved Ejection Fraction(55-60% 01/2018) BP this AM 139/88.RHC show severe pulmonary hypertension but PCWP of 16. Close to euvolemia. Weight from 143.4kg <- 145kg. - C/w metoprolol 25mg  daily - C/w Lasix 120mg  IV qId -Daily weights - I&Os  Obstructive Sleep Apnea  Much more alert with  CPAP use. - Strongly encourage CPAP use - C/w O2 [place] keep sat above 88  Type 2 Diabetes Mellitus -C/wLantus 25 units qhs - glucose checks - SSI  Dispo: Anticipated discharge in approximately 0-1 day(s).   DVT prophx: Lovenox Diet: Renal/Diabetic w/ fluid restrcition Code: Ernestene Mention, MD 03/05/2018, 11:24 AM Pager: [phone]

## 2018-03-05 NOTE — Progress Notes (Signed)
Internal Medicine Attending:   I saw and examined the patient. I reviewed the resident's note and I agree with the resident's findings and plan as documented in the resident's note.  Hospital day #7 with acute on chronic kidney disease and responding well to diuresis. I think we have accomplished as much IV diuresis as he is going to allow. He instructs [place] to prepare his discharge to home today. Medically this seems safe, but he is certainly still moderately volume overloaded. He should follow up closely with [name] at [facility] to continue planning for HD initiation.   Erlinda Hong, MD

## 2018-03-05 NOTE — Progress Notes (Signed)
Patient discharged to home with instructions. 

## 2018-03-05 NOTE — Discharge Summary (Signed)
Name: Kenneth Fox MRN: [id number] DOB: 03-26-1971 47 y.o. PCP: Lindaann Pascal, PA-C  Date of Admission: 02/25/2018 11:02 PM Date of Discharge: 03/05/2018 Attending Physician: Darel Hong, MD  Discharge Diagnosis: 1. Biliary Colic 2. Chronic Kidney Disease Stage 4 2/2 diabetic nephropathy 3. Pulmonary Hypertension 4. Iron deficiency Anemia 5. Diastolic Heart Failure  Discharge Medications: Allergies as of 03/05/2018      Reactions   Angiotensin Receptor Blockers Other (See Comments)   Acute renal failure in patient w R heart failure   Lisinopril Other (See Comments)   Patient developed AKI after being on lisinopril for a week.      Medication List    STOP taking these medications   metoCLOPramide 10 MG tablet Commonly known as:  REGLAN     TAKE these medications   acetaminophen 500 MG tablet Commonly known as:  TYLENOL Take 1 tablet (500 mg total) by mouth every 6 (six) hours as needed. What changed:  reasons to take this   furosemide 80 MG tablet Commonly known as:  LASIX Take 2 tablets (160 mg total) by mouth 3 (three) times daily. What changed:    how much to take  when to take this   FUSION PLUS PO Take 1 capsule by mouth daily.   hydrALAZINE 50 MG tablet Commonly known as:  APRESOLINE Take 1 tablet (50 mg total) by mouth 3 (three) times daily. What changed:    how much to take  when to take this   LEVEMIR FLEXTOUCH 100 UNIT/ML Pen Generic drug:  Insulin Detemir Inject 40 Units into the skin 2 (two) times daily.   LYRICA 150 MG capsule Generic drug:  pregabalin Take 150 mg by mouth 3 (three) times daily.   methocarbamol 500 MG tablet Commonly known as:  ROBAXIN Take 500-1,000 mg by mouth 3 (three) times daily as needed for spasms.   metoprolol succinate 25 MG 24 hr tablet Commonly known as:  TOPROL-XL Take 1 tablet (25 mg total) by mouth daily. Start taking on:  03/06/2018   NOVOLOG FLEXPEN 100 UNIT/ML FlexPen Generic drug:   insulin aspart Inject 10-25 Units into the skin 3 (three) times daily with meals. SLIDING SCALE INSULIN   Oxycodone HCl 10 MG Tabs Take 10 mg by mouth every 6 (six) hours as needed for pain.   Potassium Chloride ER 20 MEQ Tbcr Take 20 mEq by mouth daily.   VENTOLIN HFA 108 (90 Base) MCG/ACT inhaler Generic drug:  albuterol Inhale 2 puffs into the lungs every 4 (four) hours as needed for wheezing or shortness of breath.       Disposition and follow-up:   Mr.Gerren E Bagnall was discharged from [facility] in Stable condition.  At the hospital follow up visit please address:  1.  CKD stage 4 - On Lasix QID for diuresis during admission - Discharged on Lasix 160mg  TID - Ensure he is taking them as prescribed - Ensure he has follow up with Nephrology  2. Biliary Colic - Nausea, Vomiting, Abdominal pain resolved at discharge - Ensure he has follow up with surgery  3.Pulmonary Hypertension/Diastolic Heart Failure - Started on Metoprolol Succinate ER 25mg  during admission - Ensure he is taking them as prescribed  4. Iron-deficiency Anemia - Started on ferrous sulfate 325mg  TID during admission - Ensure he is taking them as prescribed  5. Obstructive Sleep Apnea - Had somnolent mentation during AM rounds when not using CPAP - Explains that he needs new CPAP mask at  home - Ensure he gets a new CPAP mask  Other: Ensure stable BP with increased diuretic dose  6.  Labs / imaging needed at time of follow-up: Renal panel  7.  Pending labs/ test needing follow-up: None  Follow-up Appointments: Follow-up Information    Jodelle Gross, NP Follow up.   Specialties:  Nurse Practitioner, Radiology, Cardiology Why:  [facility] office - Wednesday Mar 30, 2018 8:30 AM. Samara Deist is one of the nurse practitioners that works with our cardiology team. Please arrive 15 minutes prior to appointment to check in. Contact information: [address] [place] [address] [phone]           Hospital Course by problem list: 1. Biliary Colic: Presented with nausea, vomiting, abdominal pain. Found to have gall bladder wall thickening and RUQ pain. Surgery consulted. Wanted AKI resolved prior to cholecystectomy. Nephrology recommend holding until he has plan for future dialysis. Surgery signed off since abdominal pain resolved and patient able to tolerate meals. Discharged w/ recommendation for outpatient follow up.  2. AKI on CKD: Presented with AKI with creatinine at 3.4 from baseline of 2.8. Examined fluid overloaded with rales and peripheral edema. Started on IV lasix. Creatine elevated to 4.17. Stopped Lasix and started fluids at 75cc/hr. Creatinine elevated even more to 4.6. Continued to exam fluid overloaded. Started Lasix at 120mg  BID. Creatine came down to 4.09. Nephrology consulted. Started aggressive diuresis with Lasix 120mg  QID. Creatinine came down to baseline and below (2.64) over next 3 days. Vascular surgery consulted for placement of AV fistula. Patient refused during admission, Vein mapping was done. Preferred follow up outpatient. Discharged on 160mg  PO lasix TID.  3. Pulmonary Hypertension/Diastolic Heart Failure: Presented fluid overloaded. Was on oral hydralazine at home. Switched to IV hydralazine as he was NPO. BP dropped to 105/57. IV hydralazine stopped. BP rose to 144/70. Started on Toprol XL 25mg . Cardiology consulted for pre-operative eval. Performed RHC which confirmed pulmonary hypertension and wedge pressure of 16. Discharged with cardiology recommendation to use CPAP regularly and lose weight.  4. Iron deficiency Anemia: Presented with hgb of 9.2, from baseline of 12. MCV of 80. Iron panel showed Iron sat 6% with TIBC 335 and Ferritin at 16. Started on ferrous sulfate 325mg  TID. Discharged on same dose.  Discharge Vitals:   BP 139/88 (BP Location: Right Arm)   Pulse 65   Temp 98.6 F (37 C) (Oral)    Resp 20   Ht 5\' 11"  (1.803 m)   Wt (!) 143.4 kg   SpO2 97%   BMI 44.10 kg/m   Pertinent Labs, Studies, and Procedures:  BMP Latest Ref Rng & Units 03/05/2018 03/04/2018 03/03/2018  Glucose 70 - 99 mg/dL 102(V) 253(G) 644(I)  BUN 6 - 20 mg/dL 34(V) 42(V) 95(G)  Creatinine 0.61 - 1.24 mg/dL 3.87(F) 6.43(P) 2.95(J)  Sodium 135 - 145 mmol/L 139 142 142  Potassium 3.5 - 5.1 mmol/L 3.5 3.3(L) 3.0(L)  Chloride 98 - 111 mmol/L 104 104 104  CO2 22 - 32 mmol/L 26 31 29   Calcium 8.9 - 10.3 mg/dL 8.8(C) 9.2 1.6(S)    CT ABDOMEN AND PELVIS WITHOUT CONTRAST  TECHNIQUE: Multidetector CT imaging of the abdomen and pelvis was performed following the standard protocol without IV contrast.  COMPARISON:  02/26/2018 abdominal ultrasound. 01/15/2018 CT abdomen and pelvis.  FINDINGS: Lower chest: Platelike atelectasis of the lung bases.  Hepatobiliary: No focal liver lesion. Mild gallbladder wall thickening and faint pericholecystic edema. Cholelithiasis on  ultrasound are radiolucent on CT. No biliary ductal dilatation.  Pancreas: Unremarkable. No pancreatic ductal dilatation or surrounding inflammatory changes.  Spleen: Normal in size without focal abnormality.  Adrenals/Urinary Tract: Adrenal glands are unremarkable. Kidneys are normal, without renal calculi, focal lesion, or hydronephrosis. Bladder is unremarkable.  Stomach/Bowel: Stomach is within normal limits. Appendix appears normal. No evidence of bowel wall thickening, distention, or inflammatory changes.  Vascular/Lymphatic: No significant vascular findings are present. No enlarged abdominal or pelvic lymph nodes.  Reproductive: Unremarkable.  Other: Small paraumbilical hernia containing fat.  Musculoskeletal: No fracture is seen. Right posterior acetabulum and iliac chronic fixed fracture.  IMPRESSION: 1. Mild gallbladder wall thickening and faint pericholecystic edema. Cholelithiasis on ultrasound are  radiolucent on CT. Findings may represent acute cholecystitis in the appropriate clinical setting. 2. Small paraumbilical hernia containing fat.  Right Heart Cath  RA = 13 RV = 87/13 (19) PA = 88/37 (58) PCW = 16 Fick cardiac output/index = 8.4/3.2 PVR = 5.0 WU Ao sat = 98% PA sat = 64%, 65%  Assessment: 1. Severe PAH with RV strain 2. Normal PCWP and output   Discharge Instructions: Discharge Instructions    (HEART FAILURE PATIENTS) Call MD:  Anytime you have any of the following symptoms: 1) 3 pound weight gain in 24 hours or 5 pounds in 1 week 2) shortness of breath, with or without a dry hacking cough 3) swelling in the hands, feet or stomach 4) if you have to sleep on extra pillows at night in order to breathe.   Complete by:  As directed    Call MD for:  difficulty breathing, headache or visual disturbances   Complete by:  As directed    Call MD for:  persistant dizziness or light-headedness   Complete by:  As directed    Call MD for:  persistant nausea and vomiting   Complete by:  As directed    Call MD for:  severe uncontrolled pain   Complete by:  As directed    Call MD for:  temperature >100.4   Complete by:  As directed    Diet - low sodium heart healthy   Complete by:  As directed    Discharge instructions   Complete by:  As directed    Thank you for allowing [place] to care for you  Your symptoms were due to gallstones causing pain and irritation of your gallbladder and fluid overload causing worsened kidney function.  Your medications have changed: - Start Metoprolol 25mg  Daily - Change Furosemide (Lasix) to 160mg  three times a day (every 8 hours)  Please follow up with nephrology in the next 1-2 weeks  Please follow up with your PCP in the next 1-2 weeks  Contact a healthcare professional if you experience a return of concerning symptoms   Increase activity slowly   Complete by:  As directed       Signed: Beola Cord, MD 03/05/2018, 1:00 PM

## 2018-03-05 NOTE — Progress Notes (Signed)
[facility] ROUNDING NOTE   Subjective:   Appears to be comfortable with some diuresis.  Almost 2-1/2 L overnight.  Patient does not wish to undergo AV fistula.  He is discussed with Dr. Chestine Spore this morning for vascular surgery and is agreeable to vein mapping at this point however wants to think about his options  Continues to diuresis well 1500 cc 03/04/2018.  Blood pressure 139/88 pulse of 65 patient on CPAP with 97% O2 sats Sodium 142 potassium 3.3 chloride 104 CO2 31 BUN 22 creatinine 2.64 glucose 139 calcium 9.2 albumin 2.8 phosphorus 2.9 hemoglobin 9.4 WBC 8.2 platelets 322  Objective:  Vital signs in last 24 hours:  Temp:  [97.6 F (36.4 C)-98.6 F (37 C)] 98.6 F (37 C) (09/27 2105) Pulse Rate:  [65-79] 65 (09/28 0606) Resp:  [18-20] 20 (09/28 0606) BP: (139-158)/(88-104) 139/88 (09/28 0606) SpO2:  [97 %-100 %] 97 % (09/28 0606) Weight:  [143.4 kg] 143.4 kg (09/28 0700)  Weight change:  Filed Weights   03/03/18 0433 03/04/18 0755 03/05/18 0700  Weight: (!) 144.9 kg (!) 143.7 kg (!) 143.4 kg    Intake/Output: I/O last 3 completed shifts: In: 718 [P.O.:718] Out: 1950 [Urine:1950]   Intake/Output this shift:  Total I/O In: 420 [P.O.:420] Out: -     CVS- RRR RS- CTA diminished throughout ABD-morbidly obese nontender EXT-1+ lower extremity edema   Basic Metabolic Panel: Recent Labs  Lab 02/28/18 0505 02/28/18 1522 03/01/18 0722 03/02/18 0515 03/03/18 1100 03/04/18 1000  NA 139  --  141 141 142 142  K 3.4*  --  3.3* 3.7 3.0* 3.3*  CL 101  --  102 102 104 104  CO2 27  --  29 29 29 31   GLUCOSE 115*  --  129* 157* 180* 139*  BUN 35*  --  33* 31* 26* 22*  CREATININE 4.64* 4.53* 4.09* 3.56* 2.89* 2.64*  CALCIUM 8.1*  --  8.3* 8.1* 8.7* 9.2  PHOS  --   --   --   --   --  2.9    Liver Function Tests: Recent Labs  Lab 02/27/18 0435 02/28/18 0505 03/01/18 0722 03/02/18 0515 03/03/18 1100 03/04/18 1000  AST 41 36 40 20 17  --   ALT  60* 61* 52* 35 29  --   ALKPHOS 274* 375* 482* 362* 312*  --   BILITOT 0.8 0.6 0.5 0.5 0.6  --   PROT 6.6 6.3* 6.4* 6.2* 6.7  --   ALBUMIN 2.6* 2.6* 2.5* 2.5* 2.8* 2.8*   No results for input(s): LIPASE, AMYLASE in the last 168 hours. No results for input(s): AMMONIA in the last 168 hours.  CBC: Recent Labs  Lab 02/27/18 0435 02/28/18 0505 03/01/18 0722 03/02/18 0515 03/03/18 1100  WBC 10.2 9.4 7.3 8.7 8.2  HGB 9.5* 9.2* 9.2* 8.7* 9.4*  HCT 31.1* 30.4* 30.4* 28.2* 30.5*  MCV 80.8 81.1 80.6 80.6 79.0  PLT 348 325 293 278 322    Cardiac Enzymes: No results for input(s): CKTOTAL, CKMB, CKMBINDEX, TROPONINI in the last 168 hours.  BNP: Invalid input(s): POCBNP  CBG: Recent Labs  Lab 03/04/18 0741 03/04/18 1259 03/04/18 1738 03/04/18 2126 03/05/18 0814  GLUCAP 118* 174* 126* 173* 136*    Microbiology: Results for orders placed or performed during the hospital encounter of 02/25/18  Surgical pcr screen     Status: None   Collection Time: 02/26/18 11:32 AM  Result Value Ref Range Status   MRSA, PCR NEGATIVE NEGATIVE  Final   Staphylococcus aureus NEGATIVE NEGATIVE Final    Comment: (NOTE) The Xpert SA Assay (FDA approved for NASAL specimens in patients 6 years of age and older), is one component of a comprehensive surveillance program. It is not intended to diagnose infection nor to guide or monitor treatment. Performed at [facility], [address] [place], [place] [address]     Coagulation Studies: No results for input(s): LABPROT, INR in the last 72 hours.  Urinalysis: No results for input(s): COLORURINE, LABSPEC, PHURINE, GLUCOSEU, HGBUR, BILIRUBINUR, KETONESUR, PROTEINUR, UROBILINOGEN, NITRITE, LEUKOCYTESUR in the last 72 hours.  Invalid input(s): APPERANCEUR    Imaging: No results found.   Medications:   . sodium chloride    . furosemide 120 mg (03/05/18 1008)   . enoxaparin (LOVENOX) injection  40 mg Subcutaneous Q24H  . ferrous  sulfate  325 mg Oral TID WC  . hydrocerin   Topical BID  . insulin aspart  0-20 Units Subcutaneous TID AC & HS  . insulin glargine  25 Units Subcutaneous QHS  . metoprolol succinate  25 mg Oral Daily  . potassium chloride  40 mEq Oral BID  . pregabalin  75 mg Oral TID  . 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  His creatinine appears little lower today.  Patient wished to hold off on fistula placement at this time.  His morbid obesity I do not think he is a candidate for peritoneal dialysis.  He follows with [name] at [facility] and can continue to follow with her  Anasarca Lasix 120 mg every 6 hours.  Improved diuresis morbid obesity right-sided heart failure pulmonary hypertension acute on chronic diastolic heart failure continues to diuresis well we will convert to oral Lasix 160 mg 3 times daily on discharge.  He will need to follow daily weights and will need appointment with [facility] next 1 to 2 weeks  Biliary colic with acute cholecystitis appears to have resolved will need cholecystectomy at some point  Diastolic heart failure appreciate input from cardiology obstructive sleep apnea encouraging CPAP use  Diabetes mellitus per primary team  Hypertension avoid the use of ACE inhibitors and ARB's  Hypokalemia replete  Anemia iron 325 mg 3 times daily we can follow as an outpatient.  Will sign off from patient today he can convert to oral Lasix on discharge I would use 160 mg 3 times daily.   LOS: 7 [name] @TODAY @11 :03 AM

## 2018-03-06 NOTE — Care Management (Signed)
Pt to resume home health services with [facility] with PT and RN.

## 2018-03-08 ENCOUNTER — Telehealth: Payer: Self-pay | Admitting: Surgery

## 2018-03-08 NOTE — Telephone Encounter (Signed)
[facility]a ppt spk to pt mld ltr 04/04/18 12pm New pt appt

## 2018-03-28 NOTE — Progress Notes (Deleted)
Cardiology Office Note   Date:  03/28/2018   ID:  Kenneth Fox, DOB [birth year], MRN [id number]  PCP:  Lindaann Pascal, PA-C  Cardiologist:  Hochrein  No chief complaint on file.    History of Present Illness: Kenneth Fox is a 47 y.o. male who presents for ongoing assessment and management of HTN, recent admission for cholecystis, NSTEMI,and acute on chronic diastolic CHF, with other history of DM, ODA, GERD, CKD IV, morbid obesity.  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 Most recent echo 03/02/2018 revealing normal EF of 55%-60%.   He was also seen by Dr.Early for dialysis access but patient was hesitant and refused to have this procedure done. He was diuresed with IV lasix and discharged on lasix 160 mg TID. He was to follow up with nephrology on discharge. He was started on metoprolol succinate 25 mg, iron supplement, and was instructed to use CPAP as directed.    Past Medical History:  Diagnosis Date  . Asthma   . CHF (congestive heart failure) (HCC)   . Diabetes (HCC)   . Elevated liver function tests   . Gastroparesis   . GERD (gastroesophageal reflux disease)   . Hypertension   . Kidney disorder   . Neuropathy   . Obesity   . Obstructive sleep apnea    uses CPAP  . Renal insufficiency   . Sickle cell trait [facility])     Past Surgical History:  Procedure Laterality Date  . ENDOSCOPIC RETROGRADE CHOLANGIOPANCREATOGRAPHY (ERCP) WITH PROPOFOL N/A 01/19/2018   Procedure: ENDOSCOPIC RETROGRADE CHOLANGIOPANCREATOGRAPHY (ERCP) WITH PROPOFOL;  Surgeon: Meridee Score Netty Starring., MD;  Location: [facility] ENDOSCOPY;  Service: Gastroenterology;  Laterality: N/A;  . HIP FRACTURE SURGERY Right 1999   "put a plate in"  . REMOVAL OF STONES  01/19/2018   Procedure: REMOVAL OF STONES;  Surgeon: Meridee Score Netty Starring., MD;  Location: [facility] ENDOSCOPY;  Service: Gastroenterology;;  . RIGHT HEART CATH N/A 03/01/2018   Procedure: RIGHT HEART  CATH;  Surgeon: Dolores Patty, MD;  Location: [facility] INVASIVE CV LAB;  Service: Cardiovascular;  Laterality: N/A;  . SPHINCTEROTOMY  01/19/2018   Procedure: SPHINCTEROTOMY;  Surgeon: Mansouraty, Netty Starring., MD;  Location: [facility] ENDOSCOPY;  Service: Gastroenterology;;     Current Outpatient Medications  Medication Sig Dispense Refill  . acetaminophen (TYLENOL) 500 MG tablet Take 1 tablet (500 mg total) by mouth every 6 (six) hours as needed. (Patient taking differently: Take 500 mg by mouth every 6 (six) hours as needed for mild pain or headache. ) 30 tablet 0  . furosemide (LASIX) 80 MG tablet Take 2 tablets (160 mg total) by mouth 3 (three) times daily. 180 tablet 0  . hydrALAZINE (APRESOLINE) 50 MG tablet Take 1 tablet (50 mg total) by mouth 3 (three) times daily. (Patient taking differently: Take 75 mg by mouth 2 (two) times daily. ) 90 tablet 0  . Iron-FA-B Cmp-C-Biot-Probiotic (FUSION PLUS PO) Take 1 capsule by mouth daily.    [name] LEVEMIR FLEXTOUCH 100 UNIT/ML Pen Inject 40 Units into the skin 2 (two) times daily.  3  . LYRICA 150 MG capsule Take 150 mg by mouth 3 (three) times daily.  0  . methocarbamol (ROBAXIN) 500 MG tablet Take 500-1,000 mg by mouth 3 (three) times daily as needed for spasms.  0  . metoprolol succinate (TOPROL-XL) 25 MG 24 hr tablet Take 1 tablet (25 mg total) by mouth daily. 30 tablet 0  .  NOVOLOG FLEXPEN 100 UNIT/ML FlexPen Inject 10-25 Units into the skin 3 (three) times daily with meals. SLIDING SCALE INSULIN  3  . Oxycodone HCl 10 MG TABS Take 10 mg by mouth every 6 (six) hours as needed for pain.    [name] Potassium Chloride ER 20 MEQ TBCR Take 20 mEq by mouth daily.  3  . VENTOLIN HFA 108 (90 Base) MCG/ACT inhaler Inhale 2 puffs into the lungs every 4 (four) hours as needed for wheezing or shortness of breath.  1   No current facility-administered medications for this visit.     Allergies:   Angiotensin receptor blockers and Lisinopril    Social History:  The  patient  reports that he has never smoked. He has never used smokeless tobacco. He reports that he does not drink alcohol or use drugs.   Family History:  The patient's family history includes Aneurysm in his father; Diabetes in his father; Heart attack in his mother; Hypertension in his father; Kidney disease in his brother and sister; Neuropathy in his father; Sickle cell anemia in his mother.    ROS: All other systems are reviewed and negative. Unless otherwise mentioned in H&P    PHYSICAL EXAM: VS:  There were no vitals taken for this visit. , BMI There is no height or weight on file to calculate BMI. GEN: Well nourished, well developed, in no acute distress HEENT: normal Neck: no JVD, carotid bruits, or masses Cardiac: ***RRR; no murmurs, rubs, or gallops,no edema  Respiratory:  Clear to auscultation bilaterally, normal work of breathing GI: soft, nontender, nondistended, + BS MS: no deformity or atrophy Skin: warm and dry, no rash Neuro:  Strength and sensation are intact Psych: euthymic mood, full affect   EKG:  EKG {ACTION; IS/IS [id number]} ordered today. The ekg ordered today demonstrates ***   Recent Labs: 01/24/2018: Magnesium 1.8 02/27/2018: B Natriuretic Peptide 43.3 03/03/2018: ALT 29; Hemoglobin 9.4; Platelets 322 03/05/2018: BUN 23; Creatinine, Ser 3.37; Potassium 3.5; Sodium 139    Lipid Panel No results found for: CHOL, TRIG, HDL, CHOLHDL, VLDL, LDLCALC, LDLDIRECT    Wt Readings from Last 3 Encounters:  03/05/18 (!) 316 lb 3.2 oz (143.4 kg)  02/25/18 226 lb 1.6 oz (102.6 kg)  01/24/18 (!) 325 lb 6.4 oz (147.6 kg)      Other studies Reviewed: 03/01/2018 Conclusion   Findings:  RA = 13 RV = 87/13 (19) PA = 88/37 (58) PCW = 16 Fick cardiac output/index = 8.4/3.2 PVR = 5.0 WU Ao sat = 98% PA sat = 64%, 65%  Assessment:  1. Severe PAH with RV strain 2. 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 Recommend weight loss. Can consider careful trial of pulmonary artery dilators as needed.    Ecnocardiogram 01/15/2018 Left ventricle: The cavity size was normal. Wall thickness was   increased in a pattern of mild LVH. Systolic function was normal.   The estimated ejection fraction was in the range of 55% to 60%.   Wall motion was normal; there were no regional wall motion   abnormalities.  ASSESSMENT AND PLAN:  1.  ***   Current medicines are reviewed at length with the patient today.    Labs/ tests ordered today include: *** Bettey Mare. Liborio Nixon, ANP, [facility]   03/28/2018 7:24 AM    [facility] [address] Office [phone] Fax [phone]

## 2018-03-30 ENCOUNTER — Ambulatory Visit: Payer: Medicare Other | Admitting: Adult Health

## 2018-04-04 ENCOUNTER — Encounter: Payer: Medicare Other | Admitting: Surgery

## 2018-04-05 ENCOUNTER — Encounter: Payer: Self-pay | Admitting: Surgery

## 2018-04-05 NOTE — Progress Notes (Signed)
Cardiology Office Note   Date:  04/06/2018   ID:  Kenneth Fox, DOB [birth year], MRN [id number]  PCP:  Lindaann Pascal, PA-C  Cardiologist: Dr. Antoine Poche, (formerly Dr. Clarene Duke) Chief Complaint  Patient presents with  . Congestive Heart Failure     History of Present Illness: Kenneth Fox is a 47 y.o. male who presents for post hospital follow up after admission NSTEMI , and acute on chronic diastolic CHF. The patient was also seen for pre-operative evaluation for cholecystectomy after found to have elevated LFT's and cholelithiasis. He was cleared for surgery after diureses from cardiac standpoint.   However due to obesity and CKD Stage 4, he was not found to be a candidate and placed on antibiotics instead.[name] He was seen by [name] for dialysis access. He refused this during hospitalization. On discharge he was placed on lasix 160 mg TID, metoprolol ER 25 mg daily, iron replacement, and advised to follow up with nephrology. He was advised to get a new CPAP mask at home to continue compliance with CPAP.  He comes today struggling with his weight and low sodium diet. He is feeling better from abdominal pain. He has decided to proceed with dialysis access and is being referred by nephrologist, [name]. He is depressed and is worried about having to be on dialysis full time. He is medically compliant.   Past Medical History:  Diagnosis Date  . Asthma   . CHF (congestive heart failure) (HCC)   . Diabetes (HCC)   . Elevated liver function tests   . Gastroparesis   . GERD (gastroesophageal reflux disease)   . Hypertension   . Kidney disorder   . Neuropathy   . Obesity   . Obstructive sleep apnea    uses CPAP  . Renal insufficiency   . Sickle cell trait [facility])     Past Surgical History:  Procedure Laterality Date  . ENDOSCOPIC RETROGRADE CHOLANGIOPANCREATOGRAPHY (ERCP) WITH PROPOFOL N/A 01/19/2018   Procedure: ENDOSCOPIC RETROGRADE CHOLANGIOPANCREATOGRAPHY (ERCP) WITH  PROPOFOL;  Surgeon: Meridee Score Netty Starring., MD;  Location: [facility] ENDOSCOPY;  Service: Gastroenterology;  Laterality: N/A;  . HIP FRACTURE SURGERY Right 1999   "put a plate in"  . REMOVAL OF STONES  01/19/2018   Procedure: REMOVAL OF STONES;  Surgeon: Meridee Score Netty Starring., MD;  Location: [facility] ENDOSCOPY;  Service: Gastroenterology;;  . RIGHT HEART CATH N/A 03/01/2018   Procedure: RIGHT HEART CATH;  Surgeon: Dolores Patty, MD;  Location: [facility] INVASIVE CV LAB;  Service: Cardiovascular;  Laterality: N/A;  . SPHINCTEROTOMY  01/19/2018   Procedure: SPHINCTEROTOMY;  Surgeon: Mansouraty, Netty Starring., MD;  Location: [facility] ENDOSCOPY;  Service: Gastroenterology;;     Current Outpatient Medications  Medication Sig Dispense Refill  . acetaminophen (TYLENOL) 500 MG tablet Take 1 tablet (500 mg total) by mouth every 6 (six) hours as needed. (Patient taking differently: Take 500 mg by mouth every 6 (six) hours as needed for mild pain or headache. ) 30 tablet 0  . Iron-FA-B Cmp-C-Biot-Probiotic (FUSION PLUS PO) Take 1 capsule by mouth daily.    [name] LEVEMIR FLEXTOUCH 100 UNIT/ML Pen Inject 40 Units into the skin 2 (two) times daily.  3  . LYRICA 150 MG capsule Take 150 mg by mouth 3 (three) times daily.  0  . methocarbamol (ROBAXIN) 500 MG tablet Take 500-1,000 mg by mouth 3 (three) times daily as needed for spasms.  0  . metoprolol succinate (TOPROL-XL) 25 MG 24 hr tablet Take 1 tablet (25 mg total) by  mouth daily. 30 tablet 0  . NOVOLOG FLEXPEN 100 UNIT/ML FlexPen Inject 10-25 Units into the skin 3 (three) times daily with meals. SLIDING SCALE INSULIN  3  . Oxycodone HCl 10 MG TABS Take 10 mg by mouth every 6 (six) hours as needed for pain.    [name] Potassium Chloride ER 20 MEQ TBCR Take 20 mEq by mouth daily.  3  . VENTOLIN HFA 108 (90 Base) MCG/ACT inhaler Inhale 2 puffs into the lungs every 4 (four) hours as needed for wheezing or shortness of breath.  1  . furosemide (LASIX) 80 MG tablet Take 2 tablets (160 mg  total) by mouth 3 (three) times daily. 180 tablet 0  . hydrALAZINE (APRESOLINE) 50 MG tablet Take 1 tablet (50 mg total) by mouth 3 (three) times daily. (Patient taking differently: Take 75 mg by mouth 2 (two) times daily. ) 90 tablet 0   No current facility-administered medications for this visit.     Allergies:   Angiotensin receptor blockers and Lisinopril    Social History:  The patient  reports that he has never smoked. He has never used smokeless tobacco. He reports that he does not drink alcohol or use drugs.   Family History:  The patient's family history includes Aneurysm in his father; Diabetes in his father; Heart attack in his mother; Hypertension in his father; Kidney disease in his brother and sister; Neuropathy in his father; Sickle cell anemia in his mother.    ROS: All other systems are reviewed and negative. Unless otherwise mentioned in H&P    PHYSICAL EXAM: VS:  BP (!) 144/74   Pulse 85   Ht 5\' 11"  (1.803 m)   Wt (!) 329 lb (149.2 kg)   BMI 45.89 kg/m  , BMI Body mass index is 45.89 kg/m. GEN: Well nourished, well developed, in no acute distress, Obese  HEENT: normal Neck: no JVD, carotid bruits, or masses Cardiac: RRR; no murmurs, rubs, or gallops,no edema  Respiratory:  Clear to auscultation bilaterally, normal work of breathing GI: soft, nontender, nondistended, + BS MS: no deformity or atrophy Skin: warm and dry, no rash Neuro:  Strength and sensation are intact Psych: euthymic mood, full affect   EKG:  Not completed this office visit.   Recent Labs: 01/24/2018: Magnesium 1.8 02/27/2018: B Natriuretic Peptide 43.3 03/03/2018: ALT 29; Hemoglobin 9.4; Platelets 322 03/05/2018: BUN 23; Creatinine, Ser 3.37; Potassium 3.5; Sodium 139    Lipid Panel No results found for: CHOL, TRIG, HDL, CHOLHDL, VLDL, LDLCALC, LDLDIRECT    Wt Readings from Last 3 Encounters:  04/06/18 (!) 329 lb (149.2 kg)  03/05/18 (!) 316 lb 3.2 oz (143.4 kg)  02/25/18 226 lb 1.6  oz (102.6 kg)    Other studies Reviewed: Echocardiogram 2018/02/07 Left ventricle: The cavity size was normal. Wall thickness was   increased in a pattern of mild LVH. Systolic function was normal.   The estimated ejection fraction was in the range of 55% to 60%.   Wall motion was normal; there were no regional wall motion   abnormalities.  Impressions:  - Technically difficult; definity used; normal LV systolic   function; mild LVH; mild RVE with RV dysfunction.  RHC: 03/01/2018 Findings:  RA = 13 RV = 87/13 (19) PA = 88/37 (58) PCW = 16 Fick cardiac output/index = 8.4/3.2 PVR = 5.0 WU Ao sat = 98% PA sat = 64%, 65%  Assessment:  1. Severe PAH with RV strain 2. 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 Recommend weight loss. Can consider careful trial of pulmonary artery dilators as needed.   ASSESSMENT AND PLAN:  1. Right Heart Failure with diastolic CHF: He is having issues with fluid retention. Saw [name] and lasix was increased to 180 mg in the pm and 160 mg in the am. He states that it works when he remembers to take it. He struggles with low sodium diet.  I have encouraged him to work with his diet to assist in prevention of further up titration of diuretics as long as possible.   2. CKD Stage IV: Has agreed to have dialysis access placed, Followed by nephrology. Last creatinine 3.37.   3. Hypertension: BP is well controlled currently. Will continue BB and hydralazine.   4. OSA: He is compliant with CPAP.    Current medicines are reviewed at length with the patient today.    Labs/ tests ordered today include: None   Bettey Mare. Liborio Nixon, ANP, AACC   04/06/2018 4:19 PM    [facility] [address] Office [phone] Fax [phone]

## 2018-04-06 ENCOUNTER — Encounter: Payer: Self-pay | Admitting: Adult Health

## 2018-04-06 ENCOUNTER — Ambulatory Visit (INDEPENDENT_AMBULATORY_CARE_PROVIDER_SITE_OTHER): Payer: Medicare Other | Admitting: Adult Health

## 2018-04-06 VITALS — BP 144/74 | HR 85 | Ht 71.0 in | Wt 329.0 lb

## 2018-04-06 DIAGNOSIS — I10 Essential (primary) hypertension: Secondary | ICD-10-CM | POA: Diagnosis not present

## 2018-04-06 DIAGNOSIS — I5042 Chronic combined systolic (congestive) and diastolic (congestive) heart failure: Secondary | ICD-10-CM | POA: Diagnosis not present

## 2018-04-06 DIAGNOSIS — I43 Cardiomyopathy in diseases classified elsewhere: Secondary | ICD-10-CM

## 2018-04-06 NOTE — Patient Instructions (Signed)
Follow-Up: You will need a follow up appointment in 3 MONTHS.  You may see  DR Antoine Poche, -or- one of the following Advanced Practice Providers on your designated Care Team:   . Theodore Demark, PA-C . Joni Reining, DNP, ANP  Medication Instructions:  NO CHANGES- Your physician recommends that you continue on your current medications as directed. Please refer to the Current Medication list given to you today.  If you need a refill on your cardiac medications before your next appointment, please call your pharmacy.  Labwork: If you have labs (blood work) drawn today and your tests are completely normal, you will receive your results ONLY by: . MyChart Message (if you have MyChart) -OR- . A paper copy in the mail  At [facility], you and your health needs are our priority.  As part of our continuing mission to provide you with exceptional heart care, we have created designated Provider Care Teams.  These Care Teams include your primary Cardiologist (physician) and Advanced Practice Providers (APPs -  Physician Assistants and Nurse Practitioners) who all work together to provide you with the care you need, when you need it.  Thank you for choosing [facility] at [facility]!!

## 2018-04-19 ENCOUNTER — Inpatient Hospital Stay (HOSPITAL_COMMUNITY): Admission: RE | Admit: 2018-04-19 | Payer: Medicare Other | Source: Ambulatory Visit

## 2018-04-21 ENCOUNTER — Other Ambulatory Visit: Payer: Self-pay | Admitting: Internal Medicine

## 2018-04-26 ENCOUNTER — Other Ambulatory Visit (HOSPITAL_COMMUNITY): Payer: Self-pay | Admitting: *Deleted

## 2018-04-26 ENCOUNTER — Encounter (HOSPITAL_COMMUNITY): Payer: Medicare Other

## 2018-04-27 ENCOUNTER — Ambulatory Visit (HOSPITAL_COMMUNITY)
Admission: RE | Admit: 2018-04-27 | Discharge: 2018-04-27 | Disposition: A | Discharge status: Home / Self Care | Payer: Medicare Other | Source: Ambulatory Visit | Attending: Nephrology | Admitting: Nephrology

## 2018-04-27 DIAGNOSIS — D509 Iron deficiency anemia, unspecified: Secondary | ICD-10-CM | POA: Diagnosis not present

## 2018-04-27 MED ORDER — SODIUM CHLORIDE 0.9 % IV SOLN
510.0000 mg | INTRAVENOUS | Status: DC
  Administered 2018-04-27: 510 mg via INTRAVENOUS
  Filled 2018-04-27: qty 17

## 2018-04-27 NOTE — Discharge Instructions (Signed)

## 2018-05-04 ENCOUNTER — Ambulatory Visit (HOSPITAL_COMMUNITY)
Admission: RE | Admit: 2018-05-04 | Discharge: 2018-05-04 | Disposition: A | Discharge status: Home / Self Care | Payer: Medicare Other | Source: Ambulatory Visit | Attending: Nephrology | Admitting: Nephrology

## 2018-05-04 DIAGNOSIS — D631 Anemia in chronic kidney disease: Secondary | ICD-10-CM | POA: Insufficient documentation

## 2018-05-04 DIAGNOSIS — N189 Chronic kidney disease, unspecified: Secondary | ICD-10-CM | POA: Insufficient documentation

## 2018-05-04 MED ORDER — SODIUM CHLORIDE 0.9 % IV SOLN
510.0000 mg | INTRAVENOUS | Status: DC
  Administered 2018-05-04: 510 mg via INTRAVENOUS
  Filled 2018-05-04: qty 17

## 2018-05-19 ENCOUNTER — Encounter: Payer: Medicare Other | Admitting: Vascular Surgery

## 2018-05-19 ENCOUNTER — Other Ambulatory Visit: Payer: Self-pay | Admitting: *Deleted

## 2018-05-20 ENCOUNTER — Encounter: Payer: Self-pay | Admitting: Vascular Surgery

## 2018-06-15 ENCOUNTER — Encounter: Payer: Medicare Other | Admitting: Vascular Surgery

## 2018-06-21 ENCOUNTER — Encounter (HOSPITAL_COMMUNITY): Payer: Medicare Other

## 2018-06-27 ENCOUNTER — Other Ambulatory Visit (HOSPITAL_COMMUNITY): Payer: Self-pay | Admitting: *Deleted

## 2018-06-28 ENCOUNTER — Encounter (HOSPITAL_COMMUNITY): Payer: Medicare Other

## 2018-07-05 ENCOUNTER — Encounter (HOSPITAL_COMMUNITY): Payer: Medicare Other

## 2018-07-06 ENCOUNTER — Other Ambulatory Visit (HOSPITAL_COMMUNITY): Payer: Self-pay | Admitting: *Deleted

## 2018-07-07 ENCOUNTER — Ambulatory Visit (HOSPITAL_COMMUNITY)
Admission: RE | Admit: 2018-07-07 | Discharge: 2018-07-07 | Disposition: A | Discharge status: Home / Self Care | Payer: Medicare Other | Source: Ambulatory Visit | Attending: Nephrology | Admitting: Nephrology

## 2018-07-07 DIAGNOSIS — N189 Chronic kidney disease, unspecified: Secondary | ICD-10-CM | POA: Diagnosis present

## 2018-07-07 DIAGNOSIS — D631 Anemia in chronic kidney disease: Secondary | ICD-10-CM | POA: Insufficient documentation

## 2018-07-07 MED ORDER — SODIUM CHLORIDE 0.9 % IV SOLN
510.0000 mg | INTRAVENOUS | Status: DC
  Administered 2018-07-07: 510 mg via INTRAVENOUS
  Filled 2018-07-07: qty 17

## 2018-07-07 NOTE — Discharge Instructions (Signed)

## 2018-07-10 NOTE — Progress Notes (Deleted)
Cardiology Office Note   Date:  07/10/2018   ID:  Kenneth Fox, DOB July 04, 1970, MRN [id number]  PCP:  Lindaann Pascal, PA-C  Cardiologist:   Rollene Rotunda, MD   No chief complaint on file.     History of Present Illness: Kenneth Fox is a 48 y.o. male who presents for follow up of acute on chronic diastolic HF, elevated cardiac enzymes, stage IV CKD, and pulmonary HTN as below.  He has not come back for clinic appts since last fall.    He was going to have a dialysis fistula placed.  However, ***     NEVIL Fox is a 48 y.o. male who presents for post hospital follow up after admission NSTEMI , and acute on chronic diastolic CHF. The patient was also seen for pre-operative evaluation for cholecystectomy after found to have elevated LFT's and cholelithiasis. He was cleared for surgery after diureses from cardiac standpoint.   However due to obesity and CKD Stage 4, he was not found to be a candidate and placed on antibiotics instead.[name] He was seen by [name] for dialysis access. He refused this during hospitalization. On discharge he was placed on lasix 160 mg TID, metoprolol ER 25 mg daily, iron replacement, and advised to follow up with nephrology. He was advised to get a new CPAP mask at home to continue compliance with CPAP.  He comes today struggling with his weight and low sodium diet. He is feeling better from abdominal pain. He has decided to proceed with dialysis access and is being referred by nephrologist, [name]. He is depressed and is worried about having to be on dialysis full time. He is medically compliant.    Past Medical History:  Diagnosis Date  . Asthma   . CHF (congestive heart failure) (HCC)   . Diabetes (HCC)   . Elevated liver function tests   . Gastroparesis   . GERD (gastroesophageal reflux disease)   . Hypertension   . Kidney disorder   . Neuropathy   . Obesity   . Obstructive sleep apnea    uses CPAP  . Renal insufficiency     . Sickle cell trait [facility])     Past Surgical History:  Procedure Laterality Date  . ENDOSCOPIC RETROGRADE CHOLANGIOPANCREATOGRAPHY (ERCP) WITH PROPOFOL N/A 01/19/2018   Procedure: ENDOSCOPIC RETROGRADE CHOLANGIOPANCREATOGRAPHY (ERCP) WITH PROPOFOL;  Surgeon: Meridee Score Netty Starring., MD;  Location: [facility] ENDOSCOPY;  Service: Gastroenterology;  Laterality: N/A;  . HIP FRACTURE SURGERY Right 1999   "put a plate in"  . REMOVAL OF STONES  01/19/2018   Procedure: REMOVAL OF STONES;  Surgeon: Meridee Score Netty Starring., MD;  Location: [facility] ENDOSCOPY;  Service: Gastroenterology;;  . RIGHT HEART CATH N/A 03/01/2018   Procedure: RIGHT HEART CATH;  Surgeon: Dolores Patty, MD;  Location: [facility] INVASIVE CV LAB;  Service: Cardiovascular;  Laterality: N/A;  . SPHINCTEROTOMY  01/19/2018   Procedure: SPHINCTEROTOMY;  Surgeon: Mansouraty, Netty Starring., MD;  Location: [facility] ENDOSCOPY;  Service: Gastroenterology;;     Current Outpatient Medications  Medication Sig Dispense Refill  . acetaminophen (TYLENOL) 500 MG tablet Take 1 tablet (500 mg total) by mouth every 6 (six) hours as needed. (Patient taking differently: Take 500 mg by mouth every 6 (six) hours as needed for mild pain or headache. ) 30 tablet 0  . furosemide (LASIX) 80 MG tablet Take 2 tablets (160 mg total) by mouth 3 (three) times daily. 180 tablet 0  . hydrALAZINE (APRESOLINE) 50 MG tablet  Take 1 tablet (50 mg total) by mouth 3 (three) times daily. (Patient taking differently: Take 75 mg by mouth 2 (two) times daily. ) 90 tablet 0  . Iron-FA-B Cmp-C-Biot-Probiotic (FUSION PLUS PO) Take 1 capsule by mouth daily.    [name]. LEVEMIR FLEXTOUCH 100 UNIT/ML Pen Inject 40 Units into the skin 2 (two) times daily.  3  . LYRICA 150 MG capsule Take 150 mg by mouth 3 (three) times daily.  0  . methocarbamol (ROBAXIN) 500 MG tablet Take 500-1,000 mg by mouth 3 (three) times daily as needed for spasms.  0  . metoprolol succinate (TOPROL-XL) 25 MG 24 hr tablet Take 1 tablet  (25 mg total) by mouth daily. 30 tablet 0  . NOVOLOG FLEXPEN 100 UNIT/ML FlexPen Inject 10-25 Units into the skin 3 (three) times daily with meals. SLIDING SCALE INSULIN  3  . Oxycodone HCl 10 MG TABS Take 10 mg by mouth every 6 (six) hours as needed for pain.    [name]. Potassium Chloride ER 20 MEQ TBCR Take 20 mEq by mouth daily.  3  . VENTOLIN HFA 108 (90 Base) MCG/ACT inhaler Inhale 2 puffs into the lungs every 4 (four) hours as needed for wheezing or shortness of breath.  1   No current facility-administered medications for this visit.     Allergies:   Angiotensin receptor blockers and Lisinopril     ROS:  Please see the history of present illness.   Otherwise, review of systems are positive for {NONE DEFAULTED:18576::"none"}.   All other systems are reviewed and negative.    PHYSICAL EXAM: VS:  There were no vitals taken for this visit. , BMI There is no height or weight on file to calculate BMI. GENERAL:  Well appearing NECK:  No jugular venous distention, waveform within normal limits, carotid upstroke brisk and symmetric, no bruits, no thyromegaly LUNGS:  Clear to auscultation bilaterally CHEST:  Unremarkable HEART:  PMI not displaced or sustained,S1 and S2 within normal limits, no S3, no S4, no clicks, no rubs, *** murmurs ABD:  Flat, positive bowel sounds normal in frequency in pitch, no bruits, no rebound, no guarding, no midline pulsatile mass, no hepatomegaly, no splenomegaly EXT:  2 plus pulses throughout, no edema, no cyanosis no clubbing    ***GENERAL:  Well appearing HEENT:  Pupils equal round and reactive, fundi not visualized, oral mucosa unremarkable NECK:  No jugular venous distention, waveform within normal limits, carotid upstroke brisk and symmetric, no bruits, no thyromegaly LYMPHATICS:  No cervical, inguinal adenopathy LUNGS:  Clear to auscultation bilaterally BACK:  No CVA tenderness CHEST:  Unremarkable HEART:  PMI not displaced or sustained,S1 and S2 within  normal limits, no S3, no S4, no clicks, no rubs, *** murmurs ABD:  Flat, positive bowel sounds normal in frequency in pitch, no bruits, no rebound, no guarding, no midline pulsatile mass, no hepatomegaly, no splenomegaly EXT:  2 plus pulses throughout, no edema, no cyanosis no clubbing SKIN:  No rashes no nodules NEURO:  Cranial nerves II through XII grossly intact, motor grossly intact throughout PSYCH:  Cognitively intact, oriented to person place and time    EKG:  EKG {ACTION; IS/IS [id number]}OT:[id number]} ordered today. The ekg ordered today demonstrates ***   Recent Labs: 01/24/2018: Magnesium 1.8 02/27/2018: B Natriuretic Peptide 43.3 03/03/2018: ALT 29; Hemoglobin 9.4; Platelets 322 03/05/2018: BUN 23; Creatinine, Ser 3.37; Potassium 3.5; Sodium 139    Lipid Panel No results found for: CHOL, TRIG, HDL, CHOLHDL, VLDL, LDLCALC, LDLDIRECT  Wt Readings from Last 3 Encounters:  07/07/18 (!) 330 lb (149.7 kg)  05/04/18 (!) 335 lb (152 kg)  04/27/18 (!) 320 lb (145.2 kg)     Right Heart Cardiac Cath:    RA = 13 RV = 87/13 (19) PA = 88/37 (58) PCW = 16 Fick cardiac output/index = 8.4/3.2 PVR = 5.0 WU Ao sat = 98% PA sat = 64%, 65%  Assessment:  1. Severe PAH with RV strain 2. Normal PCWP and output   Other studies Reviewed: Additional studies/ records that were reviewed today include: ***. Review of the above records demonstrates:  Please see elsewhere in the note.  ***   ASSESSMENT AND PLAN:  Right Heart Failure with diastolic CHF:     He had elevated pulmonary pressures as above.  This was thought to be multifactorial with obesity hypoventilation and sleep apnea.  ***  .   is having issues with fluid retention. Saw [name] and lasix was increased to 180 mg in the pm and 160 mg in the am. He states that it works when he remembers to take it. He struggles with low sodium diet.  I have encouraged him to work with his diet to assist in prevention of further up  titration of diuretics as long as possible.   CKD Stage IV:    ***  Has agreed to have dialysis access placed, Followed by nephrology. Last creatinine 3.37.   Hypertension:    ***  BP is well controlled currently. Will continue BB and hydralazine.   OSA:    ***  He is compliant with CPAP.     Current medicines are reviewed at length with the patient today.  The patient {ACTIONS; HAS/DOES NOT HAVE:19233} concerns regarding medicines.  The following changes have been made:  {PLAN; NO CHANGE:13088:s}  Labs/ tests ordered today include: *** No orders of the defined types were placed in this encounter.    Disposition:   FU with ***    Signed, Rollene Rotunda, MD  07/10/2018 9:44 AM    [facility]

## 2018-07-11 ENCOUNTER — Ambulatory Visit: Payer: Medicare Other | Admitting: Cardiology

## 2018-07-12 ENCOUNTER — Encounter: Payer: Self-pay | Admitting: *Deleted

## 2018-07-14 ENCOUNTER — Inpatient Hospital Stay (HOSPITAL_COMMUNITY): Admission: RE | Admit: 2018-07-14 | Payer: Medicare Other | Source: Ambulatory Visit

## 2018-07-21 ENCOUNTER — Ambulatory Visit (HOSPITAL_COMMUNITY)
Admission: RE | Admit: 2018-07-21 | Discharge: 2018-07-21 | Disposition: A | Discharge status: Home / Self Care | Payer: Medicare Other | Source: Ambulatory Visit | Attending: Nephrology | Admitting: Nephrology

## 2018-07-21 DIAGNOSIS — D631 Anemia in chronic kidney disease: Secondary | ICD-10-CM | POA: Insufficient documentation

## 2018-07-21 DIAGNOSIS — N189 Chronic kidney disease, unspecified: Secondary | ICD-10-CM | POA: Insufficient documentation

## 2018-07-21 MED ORDER — SODIUM CHLORIDE 0.9 % IV SOLN
510.0000 mg | INTRAVENOUS | Status: DC
  Administered 2018-07-21: 510 mg via INTRAVENOUS
  Filled 2018-07-21: qty 510

## 2018-08-10 ENCOUNTER — Encounter: Payer: Self-pay | Admitting: Family

## 2018-08-10 ENCOUNTER — Encounter: Payer: Medicare Other | Admitting: Vascular Surgery

## 2018-08-19 IMAGING — US US RENAL
1 series · 14 of 25 positions shown · non-contrast
Comparison: 01/10/2018 renal ultrasound. 01/15/2018 CT and prior
studies

CLINICAL DATA: 47-year-old male with acute kidney injury.

EXAM:
RENAL / URINARY TRACT ULTRASOUND COMPLETE

[Series 1: us renal · 0.30mm/px · 14 of 36 slices shown]
[im 1/36]
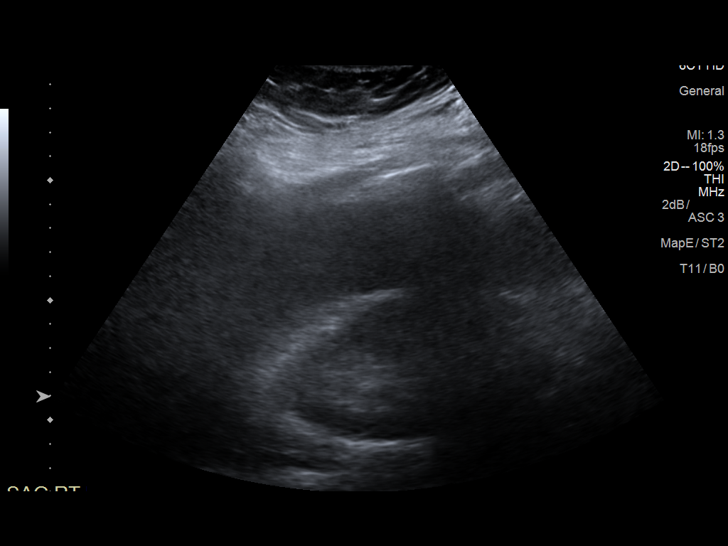
[im 3/36]
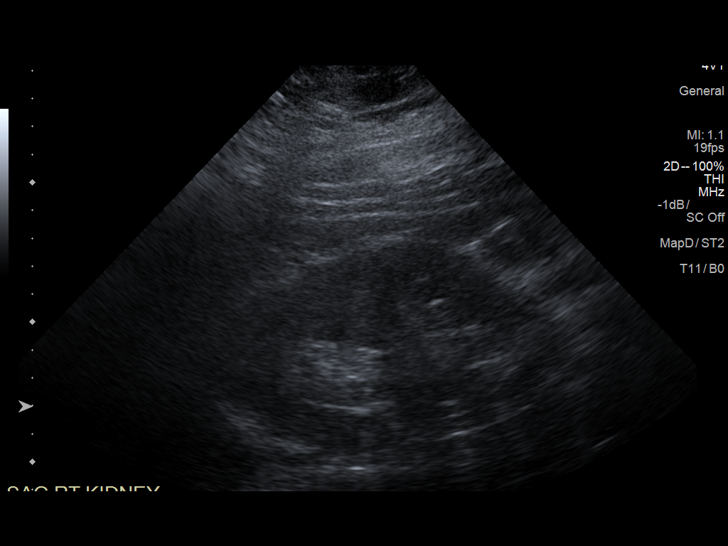
[im 6/36]
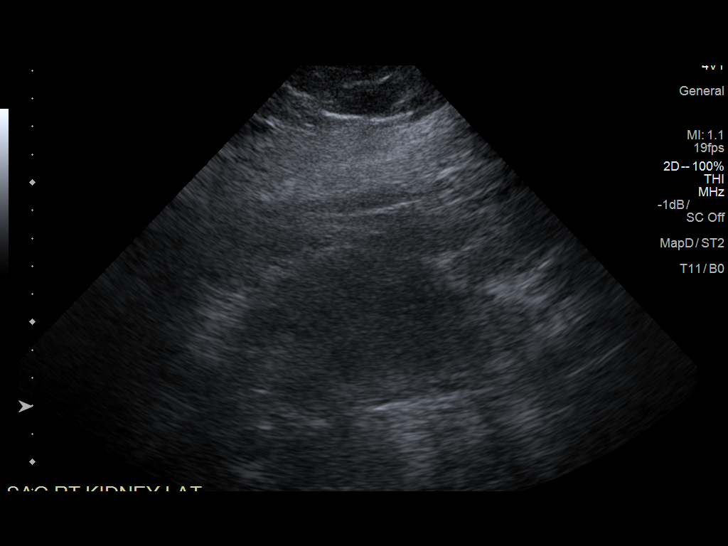
[im 9/36]
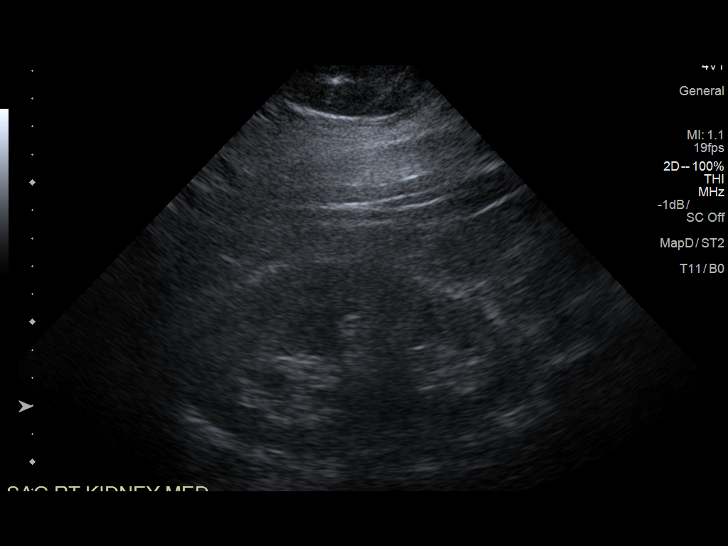
[im 12/36]
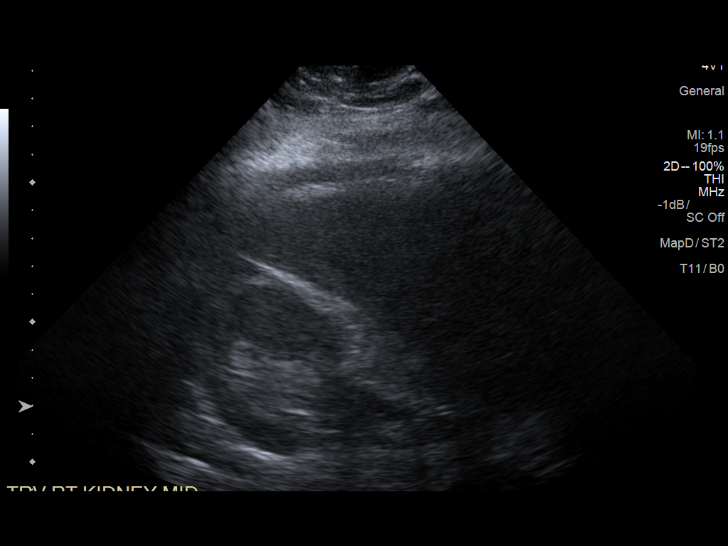
[im 14/36]
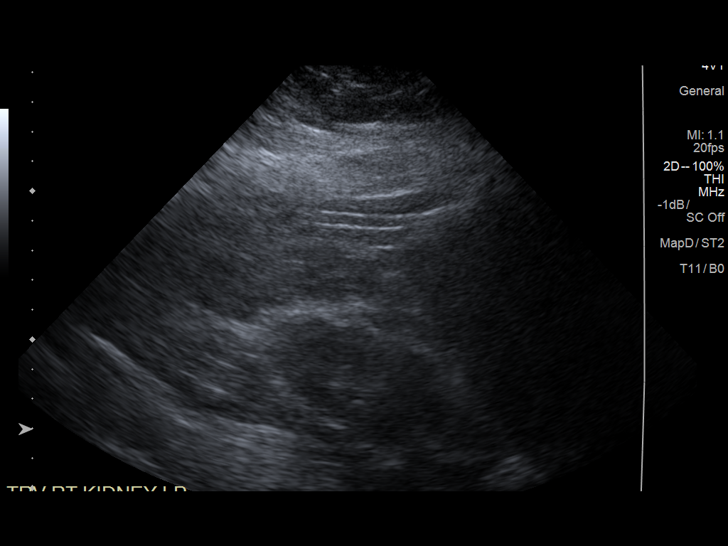
[im 17/36]
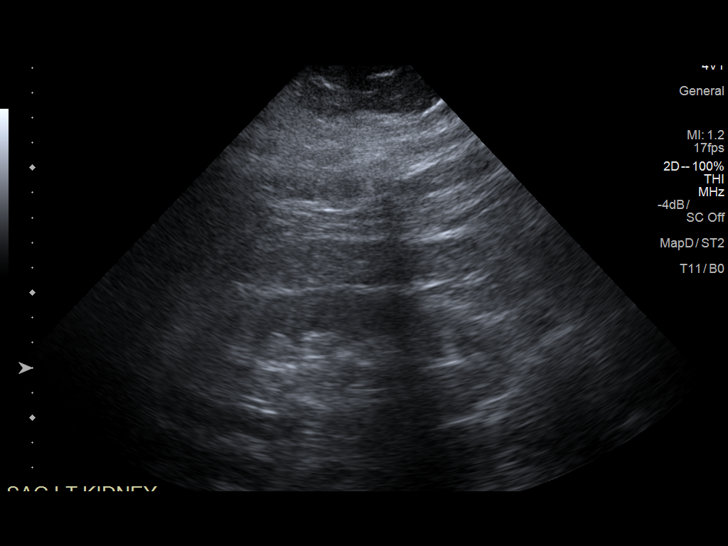
[im 19/36]
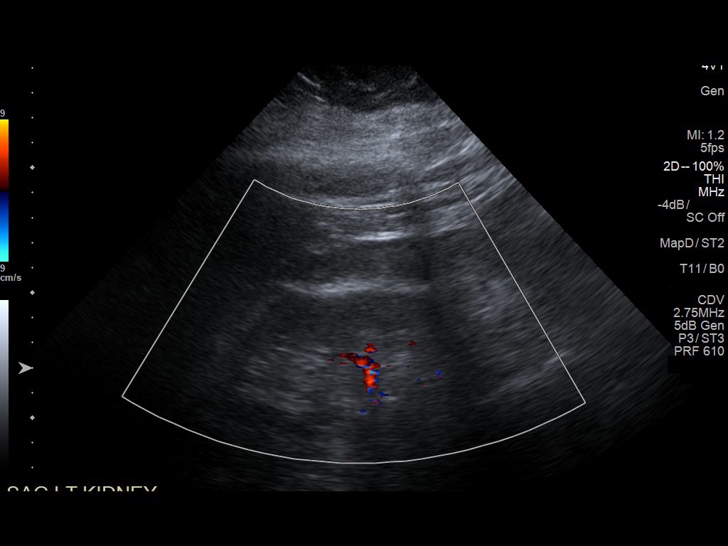
[im 22/36]
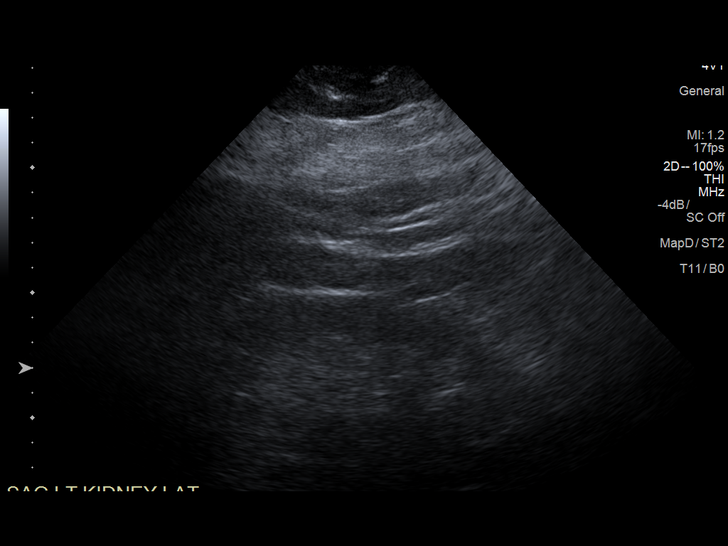
[im 24/36]
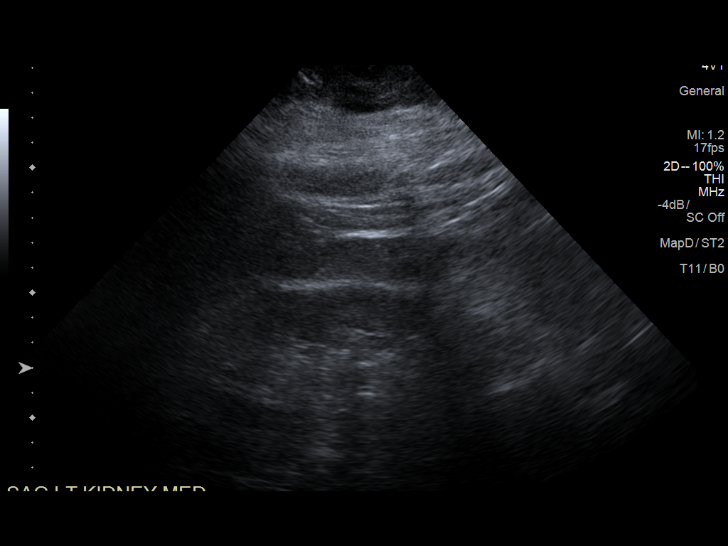
[im 27/36]
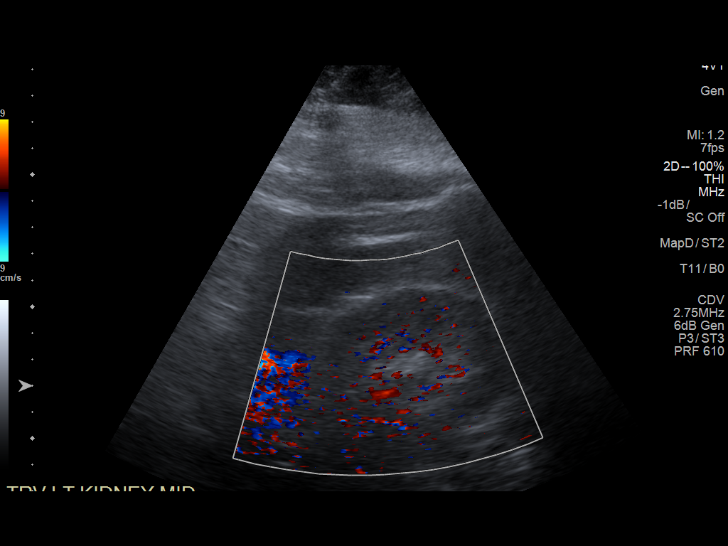
[im 30/36]
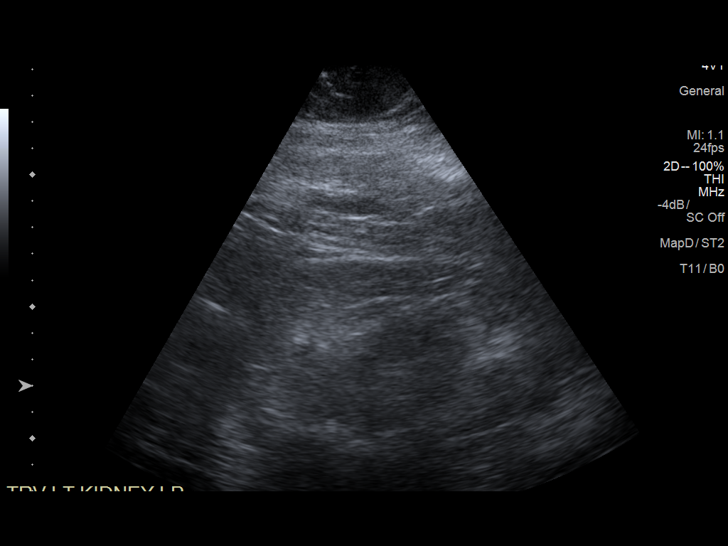
[im 33/36]
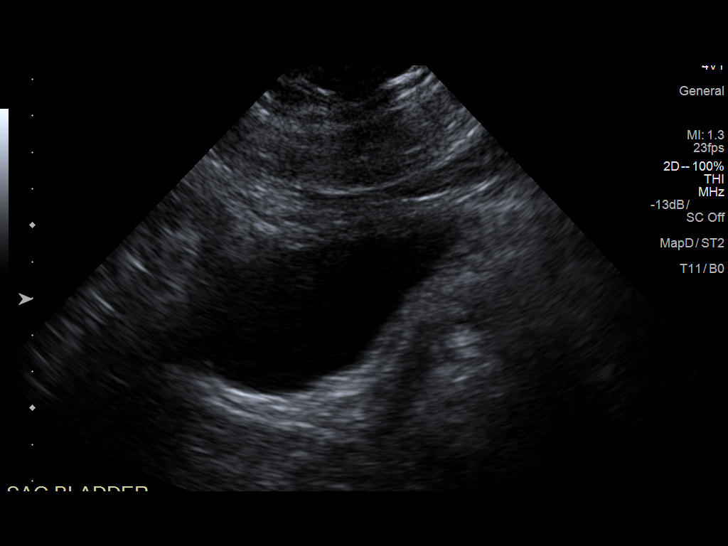
[im 36/36]
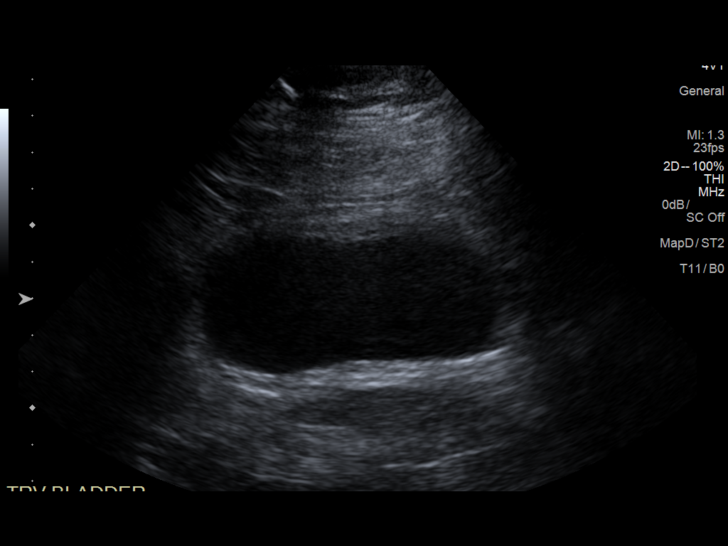

[14 of 25 positions shown; findings below may reference images not displayed]

FINDINGS: Right Kidney:

Length: 12.3 cm. Echogenicity within normal limits. No mass or
hydronephrosis visualized.

Left Kidney:

Length: 14.1 cm. Echogenicity within normal limits. No mass or
hydronephrosis visualized.

Bladder:

Appears normal for degree of bladder distention.
IMPRESSION: Normal renal ultrasound.

## 2018-09-07 DEATH — deceased

## 2020-06-01 IMAGING — NM NM HEPATOBILIARY IMAGE, INC GB
2 series · 12 of 12 positions shown · non-contrast
Comparison: CT abdomen and pelvis January 15, 2018; ultrasound right
upper quadrant January 15, 2018

CLINICAL DATA: Abdominal pain with nausea and vomiting

EXAM:
NUCLEAR MEDICINE HEPATOBILIARY IMAGING
TECHNIQUE: Sequential images of the abdomen were obtained [DATE] minutes
following intravenous administration of radiopharmaceutical.
RADIOPHARMACEUTICALS:  5.4 mCi Zc-NNm  Choletec IV

[he hepatobiliary · 4.52mm/px · 6 of 60 frames shown (1 of 2)]
[frame 6/60]
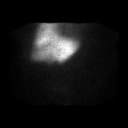
[frame 16/60]
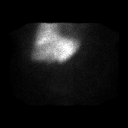
[frame 26/60]
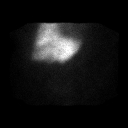
[frame 36/60]
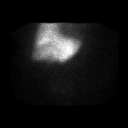
[frame 46/60]
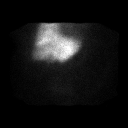
[frame 56/60]
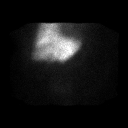

[he hepatobiliary · 4.52mm/px · 6 of 60 frames shown (2 of 2)]
[frame 6/60]
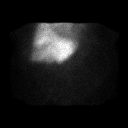
[frame 16/60]
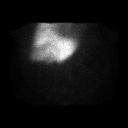
[frame 26/60]
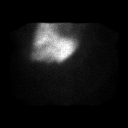
[frame 36/60]
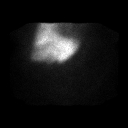
[frame 46/60]
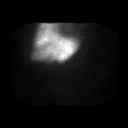
[frame 56/60]
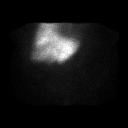

[12 of 12 positions shown; findings below may reference images not displayed]

FINDINGS: Liver uptake of radiotracer is unremarkable. After 2 hours, neither
gallbladder nor small bowel visualizes.
IMPRESSION: Nonvisualization of gallbladder and small bowel after 2 hours. While
the patient's bilirubin is elevated, this degree of bilirubin
elevation is not felt to be sufficient to cause nonvisualization of
small bowel. This overall appearance raises concern for obstruction
of both cystic and common bile ducts.

From an imaging standpoint, abdominal MR/MRCP would be optimal for
further assessment in this regard, in particular to assess for
potential mass or calculus causing this these sonographic
abnormalities. Cholangitis is a differential consideration.

## 2020-06-02 IMAGING — RF DG ERCP WO/W SPHINCTEROTOMY
1 series · 3 of 3 positions shown · non-contrast
Comparison: Scintigraphy 01/18/2018 and prior studies

CLINICAL DATA: ERCP Bile duct stone Dr. Ngan [HOSPITAL] ENDO
2 minutes 58 seconds fluoro time 3 images sent to PACS

EXAM:
ERCP
TECHNIQUE: Multiple spot images obtained with the fluoroscopic device and
submitted for interpretation post-procedure.

[Series 1: run · 3 of 3 slices shown]
[im 1/3]
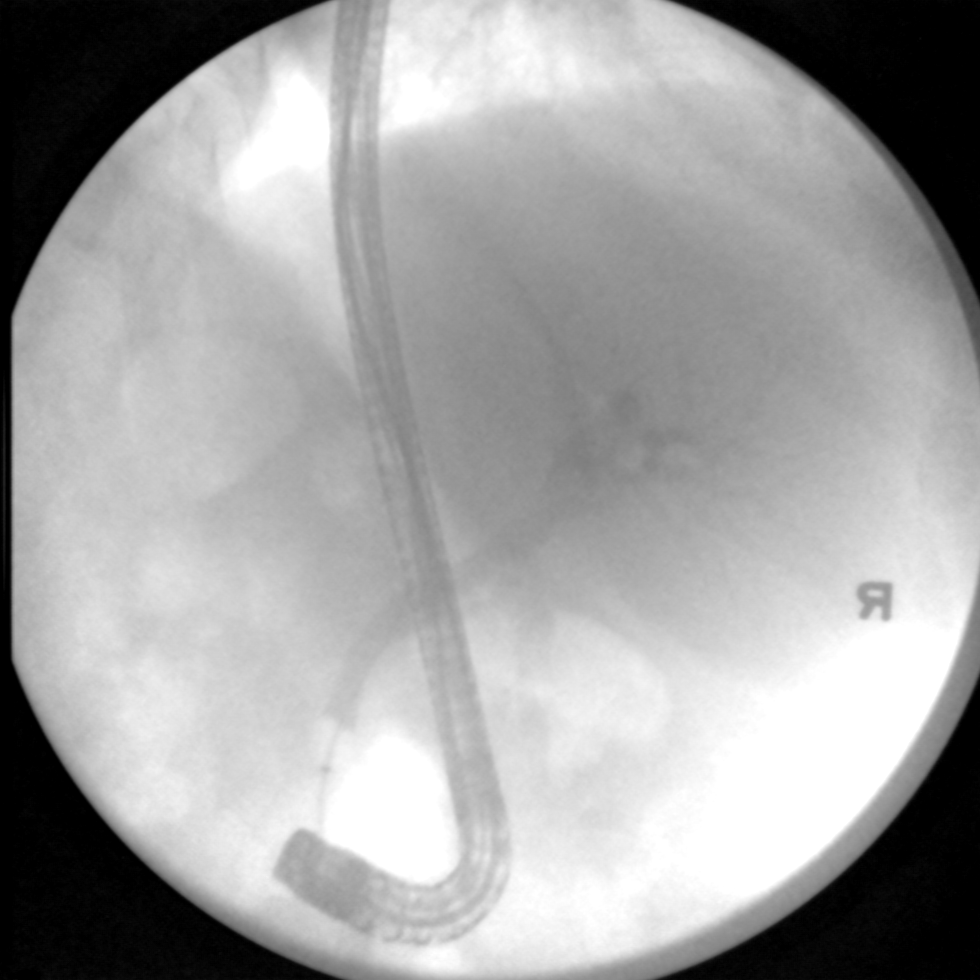
[im 2/3]
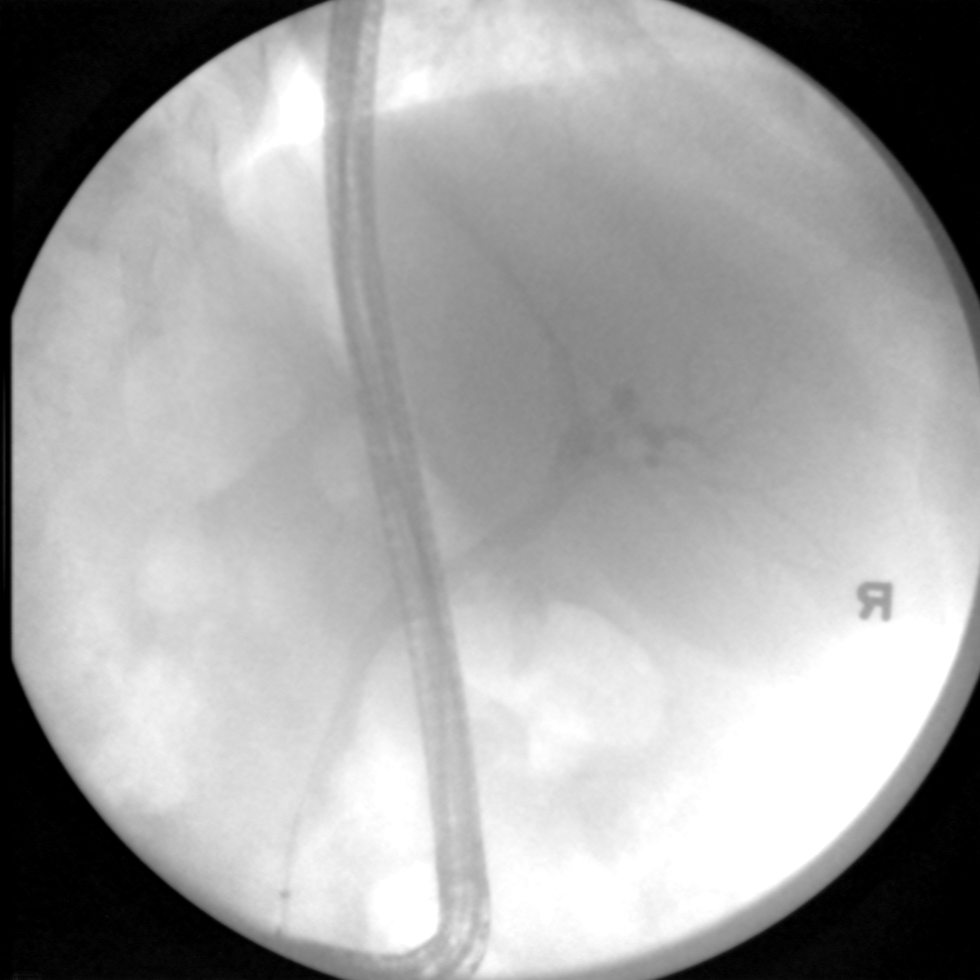
[im 3/3]
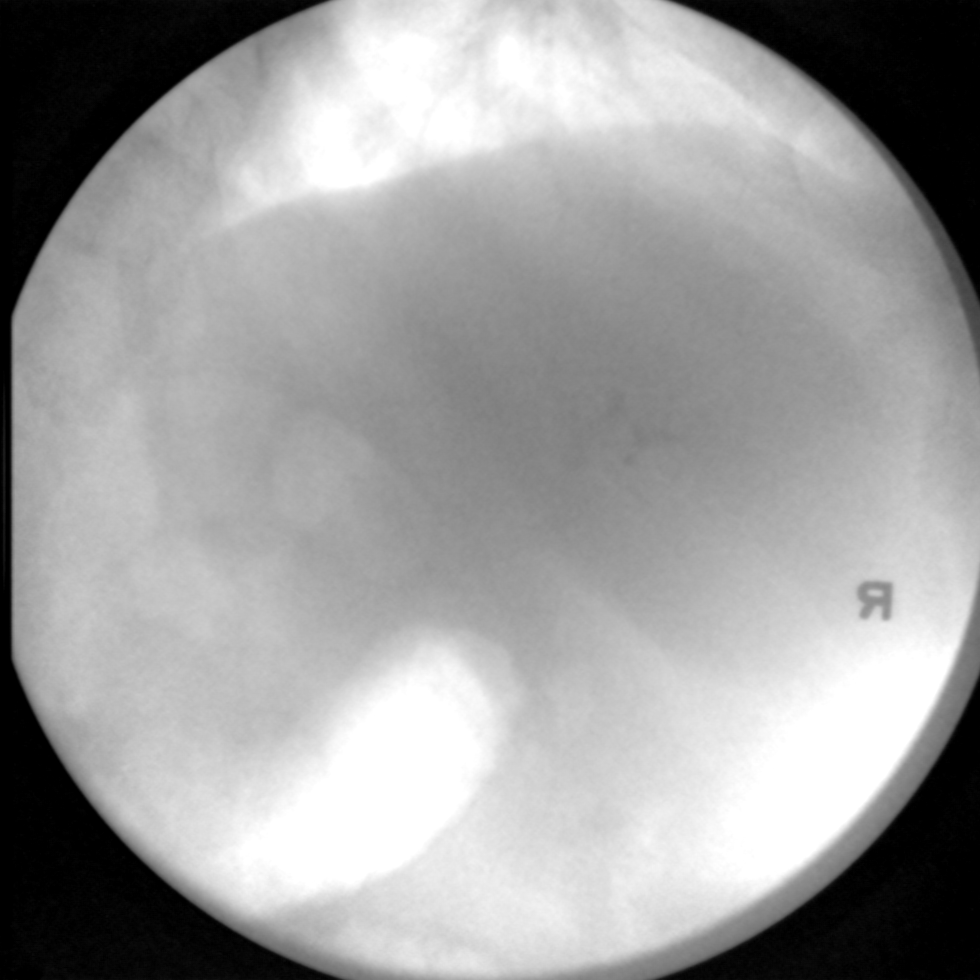

[3 of 3 positions shown; findings below may reference images not displayed]

FINDINGS: 3 intraoperative fluoroscopic spot images document endoscopic
cannulation and opacification of the CBD with passage of a balloon
tipped catheter. The intrahepatic ducts appear nondilated. No
extravasation is identified.
IMPRESSION: Endoscopic CBD cannulation and intervention

These images were submitted for radiologic interpretation only.
Please see the procedural report for the amount of contrast and the
fluoroscopy time utilized.
